# Patient Record
Sex: Female | Born: 1975
Health system: Southern US, Community
[De-identification: ages and names within clinical notes are randomized; demographics above are authoritative.]

## PROBLEM LIST (undated history)

## (undated) DIAGNOSIS — H9190 Unspecified hearing loss, unspecified ear: Secondary | ICD-10-CM

## (undated) DIAGNOSIS — I10 Essential (primary) hypertension: Secondary | ICD-10-CM

## (undated) DIAGNOSIS — G43909 Migraine, unspecified, not intractable, without status migrainosus: Secondary | ICD-10-CM

## (undated) DIAGNOSIS — K219 Gastro-esophageal reflux disease without esophagitis: Secondary | ICD-10-CM

## (undated) DIAGNOSIS — M545 Low back pain, unspecified: Secondary | ICD-10-CM

## (undated) DIAGNOSIS — E119 Type 2 diabetes mellitus without complications: Secondary | ICD-10-CM

## (undated) DIAGNOSIS — Z9889 Other specified postprocedural states: Secondary | ICD-10-CM

## (undated) DIAGNOSIS — F509 Eating disorder, unspecified: Secondary | ICD-10-CM

## (undated) DIAGNOSIS — J189 Pneumonia, unspecified organism: Secondary | ICD-10-CM

## (undated) DIAGNOSIS — F40240 Claustrophobia: Secondary | ICD-10-CM

## (undated) DIAGNOSIS — N189 Chronic kidney disease, unspecified: Secondary | ICD-10-CM

## (undated) DIAGNOSIS — F603 Borderline personality disorder: Secondary | ICD-10-CM

## (undated) DIAGNOSIS — R569 Unspecified convulsions: Secondary | ICD-10-CM

## (undated) DIAGNOSIS — I2699 Other pulmonary embolism without acute cor pulmonale: Secondary | ICD-10-CM

## (undated) DIAGNOSIS — N809 Endometriosis, unspecified: Secondary | ICD-10-CM

## (undated) DIAGNOSIS — Z8489 Family history of other specified conditions: Secondary | ICD-10-CM

## (undated) DIAGNOSIS — F32A Depression, unspecified: Secondary | ICD-10-CM

## (undated) DIAGNOSIS — D649 Anemia, unspecified: Secondary | ICD-10-CM

## (undated) DIAGNOSIS — F419 Anxiety disorder, unspecified: Secondary | ICD-10-CM

## (undated) DIAGNOSIS — E039 Hypothyroidism, unspecified: Secondary | ICD-10-CM

## (undated) DIAGNOSIS — E785 Hyperlipidemia, unspecified: Secondary | ICD-10-CM

## (undated) DIAGNOSIS — F29 Unspecified psychosis not due to a substance or known physiological condition: Secondary | ICD-10-CM

## (undated) DIAGNOSIS — R000 Tachycardia, unspecified: Secondary | ICD-10-CM

## (undated) DIAGNOSIS — F329 Major depressive disorder, single episode, unspecified: Secondary | ICD-10-CM

## (undated) DIAGNOSIS — R630 Anorexia: Secondary | ICD-10-CM

## (undated) DIAGNOSIS — F909 Attention-deficit hyperactivity disorder, unspecified type: Secondary | ICD-10-CM

## (undated) DIAGNOSIS — M1990 Unspecified osteoarthritis, unspecified site: Secondary | ICD-10-CM

## (undated) DIAGNOSIS — Z87442 Personal history of urinary calculi: Secondary | ICD-10-CM

## (undated) DIAGNOSIS — R112 Nausea with vomiting, unspecified: Secondary | ICD-10-CM

## (undated) DIAGNOSIS — G8929 Other chronic pain: Secondary | ICD-10-CM

## (undated) DIAGNOSIS — Z7289 Other problems related to lifestyle: Secondary | ICD-10-CM

## (undated) DIAGNOSIS — F429 Obsessive-compulsive disorder, unspecified: Secondary | ICD-10-CM

## (undated) HISTORY — PX: LAPAROSCOPIC ABDOMINAL EXPLORATION: SHX6249

## (undated) HISTORY — DX: Hyperlipidemia, unspecified: E78.5

## (undated) HISTORY — PX: COCHLEAR IMPLANT: SUR684

---
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
## 1998-04-22 HISTORY — PX: BREAST LUMPECTOMY: SHX2

## 2000-04-22 HISTORY — PX: BREAST LUMPECTOMY: SHX2

## 2001-04-22 DIAGNOSIS — I82409 Acute embolism and thrombosis of unspecified deep veins of unspecified lower extremity: Secondary | ICD-10-CM

## 2001-04-22 HISTORY — DX: Acute embolism and thrombosis of unspecified deep veins of unspecified lower extremity: I82.409

## 2001-09-20 HISTORY — PX: ABDOMINAL HYSTERECTOMY: SHX81

## 2001-10-20 DIAGNOSIS — I2699 Other pulmonary embolism without acute cor pulmonale: Secondary | ICD-10-CM

## 2001-10-20 HISTORY — DX: Other pulmonary embolism without acute cor pulmonale: I26.99

## 2004-12-21 ENCOUNTER — Inpatient Hospital Stay (HOSPITAL_COMMUNITY): Admission: AD | Admit: 2004-12-21 | Discharge: 2004-12-24 | Payer: Self-pay | Admitting: Psychiatry

## 2004-12-21 ENCOUNTER — Ambulatory Visit: Payer: Self-pay | Admitting: Psychiatry

## 2009-04-22 DIAGNOSIS — J189 Pneumonia, unspecified organism: Secondary | ICD-10-CM

## 2009-04-22 HISTORY — DX: Pneumonia, unspecified organism: J18.9

## 2012-07-10 ENCOUNTER — Encounter (HOSPITAL_COMMUNITY): Payer: Self-pay | Admitting: *Deleted

## 2012-07-10 ENCOUNTER — Emergency Department (HOSPITAL_COMMUNITY)
Admission: EM | Admit: 2012-07-10 | Discharge: 2012-07-11 | Disposition: A | Discharge status: Home / Self Care | Payer: Medicare Other | Attending: Emergency Medicine | Admitting: Emergency Medicine

## 2012-07-10 DIAGNOSIS — R5383 Other fatigue: Secondary | ICD-10-CM | POA: Insufficient documentation

## 2012-07-10 DIAGNOSIS — F429 Obsessive-compulsive disorder, unspecified: Secondary | ICD-10-CM | POA: Insufficient documentation

## 2012-07-10 DIAGNOSIS — R5381 Other malaise: Secondary | ICD-10-CM | POA: Insufficient documentation

## 2012-07-10 DIAGNOSIS — K219 Gastro-esophageal reflux disease without esophagitis: Secondary | ICD-10-CM | POA: Insufficient documentation

## 2012-07-10 DIAGNOSIS — Z8742 Personal history of other diseases of the female genital tract: Secondary | ICD-10-CM | POA: Insufficient documentation

## 2012-07-10 DIAGNOSIS — F329 Major depressive disorder, single episode, unspecified: Secondary | ICD-10-CM | POA: Insufficient documentation

## 2012-07-10 DIAGNOSIS — E039 Hypothyroidism, unspecified: Secondary | ICD-10-CM | POA: Insufficient documentation

## 2012-07-10 DIAGNOSIS — N189 Chronic kidney disease, unspecified: Secondary | ICD-10-CM | POA: Insufficient documentation

## 2012-07-10 DIAGNOSIS — G43909 Migraine, unspecified, not intractable, without status migrainosus: Secondary | ICD-10-CM | POA: Insufficient documentation

## 2012-07-10 DIAGNOSIS — F3289 Other specified depressive episodes: Secondary | ICD-10-CM | POA: Insufficient documentation

## 2012-07-10 DIAGNOSIS — N289 Disorder of kidney and ureter, unspecified: Secondary | ICD-10-CM | POA: Insufficient documentation

## 2012-07-10 DIAGNOSIS — Z79899 Other long term (current) drug therapy: Secondary | ICD-10-CM | POA: Insufficient documentation

## 2012-07-10 DIAGNOSIS — F411 Generalized anxiety disorder: Secondary | ICD-10-CM | POA: Insufficient documentation

## 2012-07-10 DIAGNOSIS — F509 Eating disorder, unspecified: Secondary | ICD-10-CM | POA: Insufficient documentation

## 2012-07-10 DIAGNOSIS — Z86711 Personal history of pulmonary embolism: Secondary | ICD-10-CM | POA: Insufficient documentation

## 2012-07-10 DIAGNOSIS — I129 Hypertensive chronic kidney disease with stage 1 through stage 4 chronic kidney disease, or unspecified chronic kidney disease: Secondary | ICD-10-CM | POA: Insufficient documentation

## 2012-07-10 DIAGNOSIS — Z8679 Personal history of other diseases of the circulatory system: Secondary | ICD-10-CM | POA: Insufficient documentation

## 2012-07-10 DIAGNOSIS — Z8669 Personal history of other diseases of the nervous system and sense organs: Secondary | ICD-10-CM | POA: Insufficient documentation

## 2012-07-10 HISTORY — DX: Tachycardia, unspecified: R00.0

## 2012-07-10 HISTORY — DX: Endometriosis, unspecified: N80.9

## 2012-07-10 HISTORY — DX: Anxiety disorder, unspecified: F41.9

## 2012-07-10 HISTORY — DX: Hypothyroidism, unspecified: E03.9

## 2012-07-10 HISTORY — DX: Migraine, unspecified, not intractable, without status migrainosus: G43.909

## 2012-07-10 HISTORY — DX: Other pulmonary embolism without acute cor pulmonale: I26.99

## 2012-07-10 HISTORY — DX: Essential (primary) hypertension: I10

## 2012-07-10 HISTORY — DX: Eating disorder, unspecified: F50.9

## 2012-07-10 HISTORY — DX: Chronic kidney disease, unspecified: N18.9

## 2012-07-10 HISTORY — DX: Obsessive-compulsive disorder, unspecified: F42.9

## 2012-07-10 HISTORY — DX: Anemia, unspecified: D64.9

## 2012-07-10 HISTORY — DX: Gastro-esophageal reflux disease without esophagitis: K21.9

## 2012-07-10 HISTORY — DX: Major depressive disorder, single episode, unspecified: F32.9

## 2012-07-10 HISTORY — DX: Depression, unspecified: F32.A

## 2012-07-10 HISTORY — DX: Unspecified convulsions: R56.9

## 2012-07-10 LAB — COMPREHENSIVE METABOLIC PANEL
ALT: 23 U/L (ref 0–35)
CO2: 20 mEq/L (ref 19–32)
Calcium: 9.4 mg/dL (ref 8.4–10.5)
Chloride: 98 mEq/L (ref 96–112)
Creatinine, Ser: 1.85 mg/dL — ABNORMAL HIGH (ref 0.50–1.10)
GFR calc Af Amer: 40 mL/min — ABNORMAL LOW (ref >90)
GFR calc non Af Amer: 35 mL/min — ABNORMAL LOW (ref >90)
Glucose, Bld: 119 mg/dL — ABNORMAL HIGH (ref 70–99)
Total Bilirubin: 0.6 mg/dL (ref 0.3–1.2)

## 2012-07-10 LAB — CBC WITH DIFFERENTIAL/PLATELET
Eosinophils Relative: 1 % (ref 0–5)
HCT: 39.7 % (ref 36.0–46.0)
Hemoglobin: 14.2 g/dL (ref 12.0–15.0)
Lymphocytes Relative: 26 % (ref 12–46)
Lymphs Abs: 2.1 10*3/uL (ref 0.7–4.0)
MCV: 88.6 fL (ref 78.0–100.0)
Monocytes Absolute: 0.6 10*3/uL (ref 0.1–1.0)
RBC: 4.48 MIL/uL (ref 3.87–5.11)
WBC: 7.8 10*3/uL (ref 4.0–10.5)

## 2012-07-10 LAB — URINALYSIS, ROUTINE W REFLEX MICROSCOPIC
Glucose, UA: NEGATIVE mg/dL
Hgb urine dipstick: NEGATIVE
Ketones, ur: NEGATIVE mg/dL
Leukocytes, UA: NEGATIVE
pH: 5 (ref 5.0–8.0)

## 2012-07-10 NOTE — ED Notes (Signed)
Pt states she has hx of eating disorder.  Has been taking laxitvees for the past couple weeks.  Took "a lot" yesterday.  C/o weakness, n/v, intense diarrhea, and abdominal pain. Lost 12 pounds in 2 weeks

## 2012-07-10 NOTE — ED Notes (Signed)
Pt has hx of taking laxatives and purging. States that tonight her abdominal pain was so bad she called [facility] Crisis line and was instructed to come to ED. Pt reports  10/10 abdominal pain. Has been purging and taking laxatives for "last couple of days" but denies taking laxatives today. Pt AO x 4.

## 2012-07-11 MED ORDER — FAMOTIDINE 20 MG PO TABS: 20.0000 mg | ORAL_TABLET | Freq: Two times a day (BID) | ORAL | Status: DC

## 2012-07-11 MED ORDER — SODIUM CHLORIDE 0.9 % IV SOLN
INTRAVENOUS | Status: DC
  Administered 2012-07-11: 01:00:00 via INTRAVENOUS

## 2012-07-11 MED ORDER — PANTOPRAZOLE SODIUM 40 MG IV SOLR
40.0000 mg | Freq: Once | INTRAVENOUS | Status: AC
  Administered 2012-07-11: 40 mg via INTRAVENOUS
  Filled 2012-07-11: qty 40

## 2012-07-11 NOTE — ED Provider Notes (Signed)
History     CSN: [id number]  Arrival date & time 07/10/12  2244   First MD Initiated Contact with Patient 07/11/12 0045      Chief Complaint  Patient presents with  . Eating Disorder  . Weakness    (Consider location/radiation/quality/duration/timing/severity/associated sxs/prior treatment) HPI History provided by Patient - has a mixed eating disorder, recently discharged from a psychiatric facility after long term admission for bipolar and her eating disorder.  She had gained about 30 pounds in the hospital and this has caused her stress and her reaction to this has been to purge and take laxatives over the last few days.  She recognizes this as an issue and presented to [facility] for help.  No SI/ HI or psychosis. She has been having some increased epigastric discomfort the last few days and was sent here from [facility] for medical clearance and to check here "electrolytes"  - she has known renal insufficiency - followed by local PCP.  No change in her urine. No blood in stools, no bloody emesis. No radiation of sharp pains, has had endoscopy in the past denies h/o PUD. Symptoms moderate in severity.   Past Medical History  Diagnosis Date  . Eating disorder   . Anxiety   . Depression   . OCD (obsessive compulsive disorder)   . Pulmonary embolism   . Seizures   . Anemia   . Tachycardia   . Hypertension   . Chronic renal insufficiency   . 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 Endometriosis   . GERD (gastroesophageal reflux disease)   . Migraine     Past Surgical History  Procedure Laterality Date  . Cochlear implant    . Eye surgery    . Breast lumpectomy      x2  . Abdominal hysterectomy      'except for right ovary'    History reviewed. No pertinent family history.  History  Substance Use Topics  . Smoking status: Never Smoker   . Smokeless tobacco: Not on file  . Alcohol Use: No    OB History   Grav Para Term Preterm Abortions TAB SAB Ect Mult  Living                  Review of Systems  Constitutional: Negative for fever and chills.  HENT: Negative for neck pain and neck stiffness.   Eyes: Negative for pain.  Respiratory: Negative for shortness of breath.   Cardiovascular: Negative for chest pain.  Gastrointestinal: Positive for abdominal pain. Negative for blood in stool.  Genitourinary: Negative for dysuria.  Musculoskeletal: Negative for back pain.  Skin: Negative for rash.  Neurological: Negative for headaches.  All other systems reviewed and are negative.    Allergies  Iodinated diagnostic agents; Abilify; Dihydroergotamine; Entex lq; Erythromycin; Lactose intolerance (gi); and Tape  Home Medications   Current Outpatient Rx  Name  Route  Sig  Dispense  Refill  . acetaminophen (TYLENOL) 500 MG tablet   Oral   Take 500 mg by mouth every 6 (six) hours as needed for pain (headache).         . desvenlafaxine (PRISTIQ) 50 MG 24 hr tablet   Oral   Take 50 mg by mouth daily.         . diphenhydrAMINE (BENADRYL) 12.5 MG/5ML elixir   Oral   Take 12.5 mg by mouth 4 (four) times daily as needed (to alleviate twitches and cramping associated with Haldol).         Marland Kitchen  diphenhydrAMINE (BENADRYL) 25 mg capsule   Oral   Take 25 mg by mouth every 6 (six) hours as needed (to alleviate cramps and twitching when taking haldol).         Marland Kitchen docusate sodium (COLACE) 100 MG capsule   Oral   Take 200 mg by mouth 2 (two) times daily.         . fenofibrate (TRICOR) 48 MG tablet   Oral   Take 48 mg by mouth daily.         . haloperidol (HALDOL) 5 MG tablet   Oral   Take 5 mg by mouth every 12 (twelve) hours as needed (for anxiety or going through crisis events).         . lactase (LACTAID) 3000 UNITS tablet   Oral   Take 1 tablet by mouth 3 (three) times daily with meals.         Marland Kitchen levothyroxine (SYNTHROID, LEVOTHROID) 175 MCG tablet   Oral   Take 175 mcg by mouth daily.         . Magnesium 200 MG  TABS   Oral   Take 1 tablet by mouth daily.         . Melatonin 3 MG TABS   Oral   Take 1 tablet by mouth every evening.         . mirtazapine (REMERON) 30 MG tablet   Oral   Take 60 mg by mouth every evening.         . Multiple Vitamin (MULTIVITAMIN WITH MINERALS) TABS   Oral   Take 1 tablet by mouth daily.         . Omega-3 Fatty Acids (FISH OIL) 1200 MG CAPS   Oral   Take 1 capsule by mouth 2 (two) times daily.         Marland Kitchen omeprazole (PRILOSEC) 20 MG capsule   Oral   Take 20 mg by mouth daily.         . ondansetron (ZOFRAN) 4 MG tablet   Oral   Take 4 mg by mouth every 8 (eight) hours as needed for nausea.         . polyethylene glycol (MIRALAX / GLYCOLAX) packet   Oral   Take 17 g by mouth daily.         . pravastatin (PRAVACHOL) 20 MG tablet   Oral   Take 20 mg by mouth every evening.         Marland Kitchen QUEtiapine (SEROQUEL) 100 MG tablet   Oral   Take 100 mg by mouth 3 (three) times daily.         . QUEtiapine (SEROQUEL) 400 MG tablet   Oral   Take 400 mg by mouth at bedtime.         . SUMAtriptan (IMITREX) 100 MG tablet   Oral   Take 50-100 mg by mouth every 2 (two) hours as needed for migraine (initial dose is 100mg , make take 50mg  every 2 hours if needed to relieve migraine.  No more than 200 mg total in 24 hours).         . traMADol (ULTRAM) 50 MG tablet   Oral   Take 50 mg by mouth 2 (two) times daily.         . Vitamin D, Ergocalciferol, (DRISDOL) 50000 UNITS CAPS   Oral   Take 50,000 Units by mouth every 7 (seven) days. On Sunday           BP  122/78  Pulse 113  Temp(Src) 97.7 F (36.5 C) (Oral)  Resp 18  SpO2 95%  Physical Exam  Constitutional: She is oriented to person, place, and time. She appears well-developed and well-nourished.  HENT:  Head: Normocephalic and atraumatic.  Mouth/Throat: Oropharynx is clear and moist. No oropharyngeal exudate.  Eyes: EOM are normal. Pupils are equal, round, and reactive to light.  No scleral icterus.  Neck: Normal range of motion. Neck supple.  Cardiovascular: Normal rate, regular rhythm and intact distal pulses.   Pulmonary/Chest: Effort normal and breath sounds normal. No respiratory distress.  Abdominal: Soft. Bowel sounds are normal. She exhibits no distension. There is no rebound and no guarding.  Mild epigastric tenderness, no tenderness otherwise and meg Murphys sign  Musculoskeletal: Normal range of motion. She exhibits no edema.  Neurological: She is alert and oriented to person, place, and time.  Skin: Skin is warm and dry.  Psychiatric: Her behavior is normal. Judgment normal.    ED Course  Procedures (including critical care time)  Results for orders placed during the hospital encounter of 07/10/12  CBC WITH DIFFERENTIAL      Result Value Range   WBC 7.8  4.0 - 10.5 K/uL   RBC 4.48  3.87 - 5.11 MIL/uL   Hemoglobin 14.2  12.0 - 15.0 g/dL   HCT 39.7  36.0 - 46.0 %   MCV 88.6  78.0 - 100.0 fL   MCH 31.7  26.0 - 34.0 pg   MCHC 35.8  30.0 - 36.0 g/dL   RDW 12.2  11.5 - 15.5 %   Platelets 327  150 - 400 K/uL   Neutrophils Relative 64  43 - 77 %   Neutro Abs 5.0  1.7 - 7.7 K/uL   Lymphocytes Relative 26  12 - 46 %   Lymphs Abs 2.1  0.7 - 4.0 K/uL   Monocytes Relative 8  3 - 12 %   Monocytes Absolute 0.6  0.1 - 1.0 K/uL   Eosinophils Relative 1  0 - 5 %   Eosinophils Absolute 0.1  0.0 - 0.7 K/uL   Basophils Relative 1  0 - 1 %   Basophils Absolute 0.0  0.0 - 0.1 K/uL  COMPREHENSIVE METABOLIC PANEL      Result Value Range   Sodium 135  135 - 145 mEq/L   Potassium 3.7  3.5 - 5.1 mEq/L   Chloride 98  96 - 112 mEq/L   CO2 20  19 - 32 mEq/L   Glucose, Bld 119 (*) 70 - 99 mg/dL   BUN 25 (*) 6 - 23 mg/dL   Creatinine, Ser 1.85 (*) 0.50 - 1.10 mg/dL   Calcium 9.4  8.4 - 10.5 mg/dL   Total Protein 7.3  6.0 - 8.3 g/dL   Albumin 4.3  3.5 - 5.2 g/dL   AST 21  0 - 37 U/L   ALT 23  0 - 35 U/L   Alkaline Phosphatase 58  39 - 117 U/L   Total Bilirubin  0.6  0.3 - 1.2 mg/dL   GFR calc non Af Amer 35 (*) >90 mL/min   GFR calc Af Amer 40 (*) >90 mL/min  URINALYSIS, ROUTINE W REFLEX MICROSCOPIC      Result Value Range   Color, Urine YELLOW  YELLOW   APPearance CLOUDY (*) CLEAR   Specific Gravity, Urine 1.021  1.005 - 1.030   pH 5.0  5.0 - 8.0   Glucose, UA NEGATIVE  NEGATIVE mg/dL  Hgb urine dipstick NEGATIVE  NEGATIVE   Bilirubin Urine NEGATIVE  NEGATIVE   Ketones, ur NEGATIVE  NEGATIVE mg/dL   Protein, ur NEGATIVE  NEGATIVE mg/dL   Urobilinogen, UA 0.2  0.0 - 1.0 mg/dL   Nitrite NEGATIVE  NEGATIVE   Leukocytes, UA NEGATIVE  NEGATIVE    2:11 AM d/w ACT - will evaluate bedside for outpatient recommendations.   She was provided with outpatient referrals - she is already established with outpatient psych program.   IVFs, IV protonix provided. PT tolerating POs and has no indication for admit at this time. She agrees to close follow up. GI referral provided.  MDM  Epigastric pain in the setting of eating disorder taking excessive amount of laxative. No acute ABD serial exams.   ACT evaluation - has good PSY f/u  Labs, UA - unknown baseline crt - has known renal insuff  IV fluids and medications provided. VS and nursing notes reviewed       Teressa Lower, MD 07/12/12 [phone]

## 2012-07-11 NOTE — ED Notes (Signed)
Pt comfortable with f/u instructions.

## 2012-08-12 ENCOUNTER — Emergency Department (INDEPENDENT_AMBULATORY_CARE_PROVIDER_SITE_OTHER)
Admission: EM | Admit: 2012-08-12 | Discharge: 2012-08-12 | Disposition: A | Discharge status: Home / Self Care | Payer: Medicare Other | Source: Home / Self Care | Attending: Family Medicine | Admitting: Family Medicine

## 2012-08-12 ENCOUNTER — Encounter (HOSPITAL_COMMUNITY): Payer: Self-pay | Admitting: *Deleted

## 2012-08-12 DIAGNOSIS — S239XXA Sprain of unspecified parts of thorax, initial encounter: Secondary | ICD-10-CM

## 2012-08-12 HISTORY — DX: Unspecified hearing loss, unspecified ear: H91.90

## 2012-08-12 MED ORDER — METAXALONE 800 MG PO TABS: 800.0000 mg | ORAL_TABLET | Freq: Three times a day (TID) | ORAL | Status: DC

## 2012-08-12 MED ORDER — DICLOFENAC POTASSIUM 50 MG PO TABS: 50.0000 mg | ORAL_TABLET | Freq: Three times a day (TID) | ORAL | Status: DC
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
## 2012-08-12 NOTE — ED Notes (Signed)
C/o severe back pain onset Monday morning. No known injury.  Pt. wearing bil. hearing aids and is HOH.  States Dr. Reine Just her psychiatrist sent her here to be checked. She has been up the past 2 nights crying with pain.  Her Mom put [facility] packs on it without relief.  Her Dad felt a knot in her back under her L shoulderblade.

## 2012-08-13 ENCOUNTER — Emergency Department (HOSPITAL_COMMUNITY): Payer: No Typology Code available for payment source

## 2012-08-13 ENCOUNTER — Encounter (HOSPITAL_COMMUNITY): Payer: Self-pay | Admitting: Emergency Medicine

## 2012-08-13 ENCOUNTER — Emergency Department (HOSPITAL_COMMUNITY)
Admission: EM | Admit: 2012-08-13 | Discharge: 2012-08-17 | Disposition: A | Discharge status: Home / Self Care | Payer: No Typology Code available for payment source | Attending: Emergency Medicine | Admitting: Emergency Medicine

## 2012-08-13 DIAGNOSIS — F3289 Other specified depressive episodes: Secondary | ICD-10-CM | POA: Diagnosis not present

## 2012-08-13 DIAGNOSIS — F32A Depression, unspecified: Secondary | ICD-10-CM

## 2012-08-13 DIAGNOSIS — F411 Generalized anxiety disorder: Secondary | ICD-10-CM | POA: Diagnosis not present

## 2012-08-13 DIAGNOSIS — Z8742 Personal history of other diseases of the female genital tract: Secondary | ICD-10-CM | POA: Diagnosis not present

## 2012-08-13 DIAGNOSIS — R4589 Other symptoms and signs involving emotional state: Secondary | ICD-10-CM

## 2012-08-13 DIAGNOSIS — Z86711 Personal history of pulmonary embolism: Secondary | ICD-10-CM | POA: Insufficient documentation

## 2012-08-13 DIAGNOSIS — N189 Chronic kidney disease, unspecified: Secondary | ICD-10-CM | POA: Insufficient documentation

## 2012-08-13 DIAGNOSIS — Z79899 Other long term (current) drug therapy: Secondary | ICD-10-CM | POA: Insufficient documentation

## 2012-08-13 DIAGNOSIS — I129 Hypertensive chronic kidney disease with stage 1 through stage 4 chronic kidney disease, or unspecified chronic kidney disease: Secondary | ICD-10-CM | POA: Insufficient documentation

## 2012-08-13 DIAGNOSIS — F603 Borderline personality disorder: Secondary | ICD-10-CM

## 2012-08-13 DIAGNOSIS — R45851 Suicidal ideations: Secondary | ICD-10-CM | POA: Diagnosis not present

## 2012-08-13 DIAGNOSIS — F329 Major depressive disorder, single episode, unspecified: Secondary | ICD-10-CM | POA: Diagnosis not present

## 2012-08-13 DIAGNOSIS — IMO0002 Reserved for concepts with insufficient information to code with codable children: Secondary | ICD-10-CM | POA: Diagnosis not present

## 2012-08-13 DIAGNOSIS — Y9389 Activity, other specified: Secondary | ICD-10-CM | POA: Diagnosis not present

## 2012-08-13 DIAGNOSIS — Z8679 Personal history of other diseases of the circulatory system: Secondary | ICD-10-CM | POA: Insufficient documentation

## 2012-08-13 DIAGNOSIS — Z8669 Personal history of other diseases of the nervous system and sense organs: Secondary | ICD-10-CM | POA: Diagnosis not present

## 2012-08-13 DIAGNOSIS — S99929A Unspecified injury of unspecified foot, initial encounter: Secondary | ICD-10-CM | POA: Diagnosis not present

## 2012-08-13 DIAGNOSIS — G43909 Migraine, unspecified, not intractable, without status migrainosus: Secondary | ICD-10-CM | POA: Insufficient documentation

## 2012-08-13 DIAGNOSIS — S99919A Unspecified injury of unspecified ankle, initial encounter: Secondary | ICD-10-CM | POA: Insufficient documentation

## 2012-08-13 DIAGNOSIS — S8990XA Unspecified injury of unspecified lower leg, initial encounter: Secondary | ICD-10-CM | POA: Insufficient documentation

## 2012-08-13 DIAGNOSIS — K219 Gastro-esophageal reflux disease without esophagitis: Secondary | ICD-10-CM | POA: Diagnosis not present

## 2012-08-13 DIAGNOSIS — Y92410 Unspecified street and highway as the place of occurrence of the external cause: Secondary | ICD-10-CM | POA: Diagnosis not present

## 2012-08-13 DIAGNOSIS — S3981XA Other specified injuries of abdomen, initial encounter: Secondary | ICD-10-CM | POA: Insufficient documentation

## 2012-08-13 DIAGNOSIS — H9190 Unspecified hearing loss, unspecified ear: Secondary | ICD-10-CM | POA: Insufficient documentation

## 2012-08-13 DIAGNOSIS — Z862 Personal history of diseases of the blood and blood-forming organs and certain disorders involving the immune mechanism: Secondary | ICD-10-CM | POA: Insufficient documentation

## 2012-08-13 DIAGNOSIS — F324 Major depressive disorder, single episode, in partial remission: Secondary | ICD-10-CM

## 2012-08-13 DIAGNOSIS — S298XXA Other specified injuries of thorax, initial encounter: Secondary | ICD-10-CM | POA: Diagnosis present

## 2012-08-13 MED ORDER — IBUPROFEN 800 MG PO TABS
800.0000 mg | ORAL_TABLET | Freq: Once | ORAL | Status: AC
  Administered 2012-08-14: 800 mg via ORAL

## 2012-08-13 MED ORDER — HYDROCODONE-ACETAMINOPHEN 5-325 MG PO TABS
1.0000 | ORAL_TABLET | Freq: Once | ORAL | Status: AC
  Administered 2012-08-14: 1 via ORAL
  Filled 2012-08-13: qty 1

## 2012-08-13 MED ORDER — DIAZEPAM 5 MG PO TABS
5.0000 mg | ORAL_TABLET | Freq: Once | ORAL | Status: AC
  Administered 2012-08-14: 5 mg via ORAL
  Filled 2012-08-13: qty 1
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
## 2012-08-14 DIAGNOSIS — F3289 Other specified depressive episodes: Secondary | ICD-10-CM | POA: Diagnosis not present

## 2012-08-14 DIAGNOSIS — F329 Major depressive disorder, single episode, unspecified: Secondary | ICD-10-CM | POA: Diagnosis not present

## 2012-08-14 LAB — COMPREHENSIVE METABOLIC PANEL
ALT: 19 U/L (ref 0–35)
AST: 23 U/L (ref 0–37)
CO2: 26 mEq/L (ref 19–32)
Chloride: 99 mEq/L (ref 96–112)
GFR calc non Af Amer: 40 mL/min — ABNORMAL LOW (ref >90)
Potassium: 4.3 mEq/L (ref 3.5–5.1)
Sodium: 135 mEq/L (ref 135–145)
Total Bilirubin: 0.3 mg/dL (ref 0.3–1.2)

## 2012-08-14 LAB — RAPID URINE DRUG SCREEN, HOSP PERFORMED
Barbiturates: NOT DETECTED
Cocaine: NOT DETECTED
Tetrahydrocannabinol: NOT DETECTED

## 2012-08-14 LAB — URINALYSIS, ROUTINE W REFLEX MICROSCOPIC
Bilirubin Urine: NEGATIVE
Hgb urine dipstick: NEGATIVE
Protein, ur: NEGATIVE mg/dL
Specific Gravity, Urine: 1.008 (ref 1.005–1.030)
Urobilinogen, UA: 0.2 mg/dL (ref 0.0–1.0)

## 2012-08-14 LAB — CBC
HCT: 37.1 % (ref 36.0–46.0)
MCV: 90 fL (ref 78.0–100.0)
Platelets: 359 10*3/uL (ref 150–400)
Platelets: 317 10*3/uL (ref 150–400)
RBC: 4.32 MIL/uL (ref 3.87–5.11)
RBC: 4.12 MIL/uL (ref 3.87–5.11)
WBC: 15.1 10*3/uL — ABNORMAL HIGH (ref 4.0–10.5)
WBC: 8.9 10*3/uL (ref 4.0–10.5)

## 2012-08-14 LAB — SALICYLATE LEVEL: Salicylate Lvl: <2 mg/dL — ABNORMAL LOW (ref 2.8–20.0)

## 2012-08-14 MED ORDER — QUETIAPINE FUMARATE 300 MG PO TABS
400.0000 mg | ORAL_TABLET | Freq: Every day | ORAL | Status: DC
  Administered 2012-08-14 – 2012-08-16 (×3): 400 mg via ORAL
  Filled 2012-08-14 (×3): qty 1

## 2012-08-14 MED ORDER — METAXALONE 800 MG PO TABS
800.0000 mg | ORAL_TABLET | Freq: Three times a day (TID) | ORAL | Status: DC
  Administered 2012-08-14 – 2012-08-17 (×10): 800 mg via ORAL
  Filled 2012-08-14 (×13): qty 1

## 2012-08-14 MED ORDER — MELATONIN 3 MG PO TABS: 1.0000 | ORAL_TABLET | Freq: Every evening | ORAL | Status: DC

## 2012-08-14 MED ORDER — VENLAFAXINE HCL ER 75 MG PO CP24
75.0000 mg | ORAL_CAPSULE | Freq: Every day | ORAL | Status: DC
  Administered 2012-08-14: 75 mg via ORAL
  Filled 2012-08-14 (×2): qty 1

## 2012-08-14 MED ORDER — MIRTAZAPINE 30 MG PO TABS
60.0000 mg | ORAL_TABLET | Freq: Every evening | ORAL | Status: DC
  Administered 2012-08-14 – 2012-08-16 (×3): 60 mg via ORAL
  Filled 2012-08-14: qty 1
  Filled 2012-08-14: qty 2
  Filled 2012-08-14: qty 1
  Filled 2012-08-14: qty 2

## 2012-08-14 MED ORDER — HALOPERIDOL 5 MG PO TABS
5.0000 mg | ORAL_TABLET | Freq: Two times a day (BID) | ORAL | Status: DC
  Administered 2012-08-14 – 2012-08-17 (×7): 5 mg via ORAL
  Filled 2012-08-14 (×5): qty 1

## 2012-08-14 MED ORDER — HALOPERIDOL 5 MG PO TABS
5.0000 mg | ORAL_TABLET | Freq: Two times a day (BID) | ORAL | Status: DC | PRN
  Filled 2012-08-14 (×2): qty 1

## 2012-08-14 MED ORDER — ACETAMINOPHEN 325 MG PO TABS
650.0000 mg | ORAL_TABLET | ORAL | Status: DC | PRN
  Administered 2012-08-14 – 2012-08-16 (×6): 650 mg via ORAL
  Filled 2012-08-14 (×6): qty 2

## 2012-08-14 MED ORDER — DIPHENHYDRAMINE HCL 50 MG/ML IJ SOLN
25.0000 mg | Freq: Once | INTRAMUSCULAR | Status: AC
  Administered 2012-08-14: 25 mg via INTRAMUSCULAR
  Filled 2012-08-14: qty 1

## 2012-08-14 MED ORDER — ADULT MULTIVITAMIN W/MINERALS CH
1.0000 | ORAL_TABLET | Freq: Every day | ORAL | Status: DC
  Administered 2012-08-14 – 2012-08-17 (×4): 1 via ORAL
  Filled 2012-08-14 (×4): qty 1

## 2012-08-14 MED ORDER — ONDANSETRON HCL 4 MG PO TABS: 4.0000 mg | ORAL_TABLET | Freq: Three times a day (TID) | ORAL | Status: DC | PRN

## 2012-08-14 MED ORDER — QUETIAPINE FUMARATE 100 MG PO TABS
100.0000 mg | ORAL_TABLET | Freq: Three times a day (TID) | ORAL | Status: DC
  Administered 2012-08-14 – 2012-08-17 (×9): 100 mg via ORAL
  Filled 2012-08-14 (×7): qty 1

## 2012-08-14 MED ORDER — SIMVASTATIN 10 MG PO TABS
10.0000 mg | ORAL_TABLET | Freq: Every day | ORAL | Status: DC
  Administered 2012-08-14 – 2012-08-16 (×3): 10 mg via ORAL
  Filled 2012-08-14 (×4): qty 1

## 2012-08-14 MED ORDER — DIPHENHYDRAMINE HCL 25 MG PO CAPS
25.0000 mg | ORAL_CAPSULE | Freq: Four times a day (QID) | ORAL | Status: DC | PRN
  Administered 2012-08-15 – 2012-08-16 (×2): 25 mg via ORAL
  Filled 2012-08-14 (×2): qty 1

## 2012-08-14 MED ORDER — LEVOTHYROXINE SODIUM 175 MCG PO TABS
175.0000 ug | ORAL_TABLET | Freq: Every day | ORAL | Status: DC
  Administered 2012-08-14 – 2012-08-17 (×4): 175 ug via ORAL
  Filled 2012-08-14 (×5): qty 1

## 2012-08-14 MED ORDER — DESVENLAFAXINE SUCCINATE ER 50 MG PO TB24
50.0000 mg | ORAL_TABLET | Freq: Every day | ORAL | Status: DC
  Administered 2012-08-15 – 2012-08-17 (×3): 50 mg via ORAL
  Filled 2012-08-14 (×4): qty 1

## 2012-08-14 MED ORDER — LORAZEPAM 0.5 MG PO TABS
0.5000 mg | ORAL_TABLET | Freq: Three times a day (TID) | ORAL | Status: DC
  Administered 2012-08-14 – 2012-08-17 (×8): 0.5 mg via ORAL
  Filled 2012-08-14 (×8): qty 1

## 2012-08-14 MED ORDER — LORAZEPAM 1 MG PO TABS
1.0000 mg | ORAL_TABLET | Freq: Three times a day (TID) | ORAL | Status: DC | PRN
  Administered 2012-08-14 – 2012-08-16 (×2): 1 mg via ORAL
  Filled 2012-08-14 (×2): qty 1

## 2012-08-14 MED ORDER — ZOLPIDEM TARTRATE 5 MG PO TABS
5.0000 mg | ORAL_TABLET | Freq: Every evening | ORAL | Status: DC | PRN
  Administered 2012-08-14 – 2012-08-15 (×2): 5 mg via ORAL
  Filled 2012-08-14 (×2): qty 1

## 2012-08-14 MED ORDER — IBUPROFEN 600 MG PO TABS
600.0000 mg | ORAL_TABLET | Freq: Three times a day (TID) | ORAL | Status: DC | PRN
  Administered 2012-08-14: 600 mg via ORAL
  Filled 2012-08-14 (×2): qty 3

## 2012-08-14 MED ORDER — HALOPERIDOL LACTATE 5 MG/ML IJ SOLN
5.0000 mg | Freq: Once | INTRAMUSCULAR | Status: AC
  Administered 2012-08-14: 5 mg via INTRAMUSCULAR
  Filled 2012-08-14: qty 1

## 2012-08-14 NOTE — ED Notes (Signed)
Pt and RN got her hearing aid batteries and charging equipment out and have her hearing aid batteries charging currently. Pt apologized for her behavior earlier this morning. Pt given a book from her belongings and also her glasses are at bedside. Pt said charge batteries for 2-3 hours. She said she's going back to sleep now but wake her for medications.

## 2012-08-14 NOTE — ED Notes (Signed)
Blood currently being drawn. EKG completed.

## 2012-08-14 NOTE — Progress Notes (Signed)
PHARMACIST - PHYSICIAN ORDER COMMUNICATION  CONCERNING: P&T Medication Policy on Herbal Medications  DESCRIPTION:  This patient's order for:  Melatonin  has been noted.  This product(s) is classified as an "herbal" or natural product. Due to a lack of definitive safety studies or FDA approval, nonstandard manufacturing practices, plus the potential risk of unknown drug-drug interactions while on inpatient medications, the Pharmacy and Therapeutics Committee does not permit the use of "herbal" or natural products of this type within [facility].   ACTION TAKEN: The pharmacy department is unable to verify this order at this time and your patient has been informed of this safety policy. Please reevaluate patient's clinical condition at discharge and address if the herbal or natural product(s) should be resumed at that time.  Thanks! Dorrene German 08/14/2012 12:35 AM

## 2012-08-14 NOTE — BHH Counselor (Signed)
Brandi Love, Education officer, museum at [employer], submitted Pt for admission to [facility]. Consulted with Ozella Rocks, Assistant Director, who said she had already spoken to Nonah Mattes, Director of Inpatient Services, regarding this Pt and Pt is declined due to Pt needing long-term treatment and for continuity of care recommended that [facility] the [facility] hospital. Communicated this recommendation to Waldon Merl, assessment counselor at [facility].  [place], [place], [facility] Assessment Counselor
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
## 2012-08-14 NOTE — ED Notes (Signed)
Community ACT worker returned with 3 additional belonging bags for the pt, those 3 additional bag were placed in the activity room.  Pt also has 3 bags in [place] [address]43

## 2012-08-14 NOTE — ED Notes (Signed)
Pt is sleeping at the start of my shift, unable to assess at this time.

## 2012-08-14 NOTE — Progress Notes (Signed)
CSW discussed IVC status with EDP who agreed to hold off on completing IVC as patient is calm and cooperative at this time. IVC paperwork was completed, signed, and notarized however not sent to magistrate.   Dorathy Kinsman, [place]  [phone] .08/14/2012 1347pm

## 2012-08-14 NOTE — BHH Counselor (Signed)
Patient referred to [facility]. Authorization obtained from [place] # [id number]. Acceptance to the waitlist is pending the results of a repeat WBC and EKG. Once completed oncoming staff will need to fax the results to [facility].   On-coming staff will need to continue seek additional placement.

## 2012-08-14 NOTE — ED Notes (Signed)
RN requested labs to be drawn later.

## 2012-08-14 NOTE — ED Notes (Signed)
Telepsych completed. Inpatient recommended due to pt's ambivalence about hurting herself.

## 2012-08-14 NOTE — ED Notes (Signed)
Per Jarrett Soho PA ok for patient to take seroquel and haldol in addition to ativan/vicodin/valium already given

## 2012-08-14 NOTE — ED Notes (Signed)
telepsych info faxed and called 

## 2012-08-14 NOTE — ED Notes (Signed)
[id number] Expected date:<BR> Expected time:<BR> Means of arrival:<BR> Comments:<BR> closed

## 2012-08-14 NOTE — BHH Counselor (Signed)
Per Marijean Bravo, in the assessment department patient declined by [facility] due to her acuity. Recommends admission to [facility].   Writer will seek alternative placement including [place] for this patient.

## 2012-08-14 NOTE — BHH Counselor (Signed)
Patient has a ACT team with [facility]. The ACT team nurse-Kelly Marcello Moores came by to visit patient. Her # is [phone]. Patients ACT team doctor is (Dr. Lesly Rubenstein) and his # is [phone]. The main # to [facility] is # [phone].

## 2012-08-14 NOTE — BHH Counselor (Signed)
  Writer faxed the WBC and EKG to [facility].  On-coming staff will need to follow up with the patients status on their wait list.

## 2012-08-14 NOTE — Progress Notes (Addendum)
Per discussion with psychiatrist and NP, patient is unable to contract for safety. Per discussion with staff, patient calm and cooperative since this morning.   Brandi Love, [place]  [phone] .08/14/2012 12:39pm

## 2012-08-14 NOTE — ED Notes (Signed)
Lab tech came to draw blood, asked to wait until pt wakes up as we are doing the same for her EKG and VS. So when pt wakes up she will get labs drawn, VS and  EKG.

## 2012-08-14 NOTE — ED Notes (Signed)
Patient moved to Rm 9 for being agitated and combative in the psych ED. She was placed in a C Collar and head blocks due to attempting self harm  By biting herself
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
## 2012-08-14 NOTE — ED Notes (Signed)
[place] key given to [employer] pt's community ACT Recruitment consultant.  Pt signed a consent to release information to the ACT team at [facility]
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
## 2012-08-14 NOTE — ED Notes (Signed)
Pt seen on camera scratching herself, RN went into room to tell her to stop. She complied. Again, pt was seen on the camera scratching her arm and this RN went into room and saw a rope wrapped around her fingers, RN asked her to take it off and give it to her, pt had to be asked several times but she finally complied. Both RN's and security went to pt room to search her to see if she had any other contraband on her. She was unwilling but a pen was found that went to the cord and pen cap she gave this RN earlier. Also, mentos candies were found in her pocket and she has on her regular underwear. Pt kept scratching herself and keeping her legs pulled up so this RN couldn't see what she was doing, she would not keep her legs down nor put her arms at her side. She said she was mad she had to stay here even though during the telepsych she said she needed to stay here. She fought with security, Environmental health practitioner as they tried to restrain her trying to kick and bite them. She kept trying to bite at restraints and did get one arm out of restraints. She required security and RN presence at all times to make sure she didn't bite herself or restraints. Seizure pads were put on her bed as a precaution should she start to bang her head on the railings of the bed. Even in restraints it took security and RN to keep pt from biting at herself and restraints. This RN called for IM orders and asked for chest restraint due to the continuous biting attempts but the EDP said to try medications first. IM orders obtained and administered. Charge RN called while medications were being drawn up and said pt could move over to main ed after medicated. At 0605 pt was moved out to main ed.

## 2012-08-14 NOTE — ED Notes (Signed)
AC from [facility] initially said that pt could move back to psych ed after 0700 when there were two nurses and a tech due to the [facility] from [facility] calling her about two RN's not being able to handle one pt. Since then, [facility] at [facility] has been in contact with the RN on call and pt will be moved back to the psych ed. This RN Radio broadcast assistant and ask that they ensure underwear were removed from pt before she returned to the psych ed and she said that wasn't a problem they could do that.

## 2012-08-14 NOTE — ED Notes (Signed)
Pt sleeping, resting and comfortable on stretcher, notified EDP, EDP ok with vitals and EKG being done once pt wakes up.

## 2012-08-15 DIAGNOSIS — F3289 Other specified depressive episodes: Secondary | ICD-10-CM | POA: Diagnosis not present

## 2012-08-15 DIAGNOSIS — F329 Major depressive disorder, single episode, unspecified: Secondary | ICD-10-CM | POA: Diagnosis not present

## 2012-08-15 MED ORDER — TRAMADOL HCL 50 MG PO TABS
50.0000 mg | ORAL_TABLET | Freq: Two times a day (BID) | ORAL | Status: DC | PRN
  Administered 2012-08-15 – 2012-08-16 (×2): 50 mg via ORAL
  Filled 2012-08-15 (×2): qty 1

## 2012-08-15 MED ORDER — FAMOTIDINE 20 MG PO TABS
20.0000 mg | ORAL_TABLET | Freq: Two times a day (BID) | ORAL | Status: DC
  Administered 2012-08-15 – 2012-08-17 (×5): 20 mg via ORAL
  Filled 2012-08-15 (×5): qty 1

## 2012-08-15 MED ORDER — MAGNESIUM OXIDE 400 (241.3 MG) MG PO TABS
200.0000 mg | ORAL_TABLET | Freq: Every day | ORAL | Status: DC
  Administered 2012-08-15 – 2012-08-17 (×3): 200 mg via ORAL
  Filled 2012-08-15 (×3): qty 0.5

## 2012-08-15 MED ORDER — DICLOFENAC POTASSIUM 50 MG PO TABS
50.0000 mg | ORAL_TABLET | Freq: Three times a day (TID) | ORAL | Status: DC | PRN
  Filled 2012-08-15: qty 1

## 2012-08-15 MED ORDER — FENOFIBRATE 54 MG PO TABS
54.0000 mg | ORAL_TABLET | Freq: Every day | ORAL | Status: DC
  Administered 2012-08-15 – 2012-08-17 (×3): 54 mg via ORAL
  Filled 2012-08-15 (×3): qty 1

## 2012-08-15 MED ORDER — MAGNESIUM 200 MG PO TABS: 1.0000 | ORAL_TABLET | Freq: Every day | ORAL | Status: DC

## 2012-08-15 MED ORDER — DOCUSATE SODIUM 100 MG PO CAPS
200.0000 mg | ORAL_CAPSULE | Freq: Two times a day (BID) | ORAL | Status: DC
  Administered 2012-08-15 – 2012-08-17 (×5): 200 mg via ORAL
  Filled 2012-08-15 (×6): qty 2

## 2012-08-15 MED ORDER — OMEGA-3-ACID ETHYL ESTERS 1 G PO CAPS
1.0000 g | ORAL_CAPSULE | Freq: Two times a day (BID) | ORAL | Status: DC
  Administered 2012-08-15 – 2012-08-17 (×5): 1 g via ORAL
  Filled 2012-08-15 (×6): qty 1

## 2012-08-15 NOTE — ED Notes (Signed)
Pt observed scratching rt forearm w/ her finger nails.  Pt is aware that she will have a sitter as long as needed for her safety.  Pt reported that she felt like she was safe and that she only "picked the scabs off."

## 2012-08-15 NOTE — ED Notes (Addendum)
Pt reports that she has "been scratching"--area on rt forearm is red, no new abrasions noted. Pt agreed not to scratch her self and to contact staff she could not comply. Ice pk to back area.

## 2012-08-15 NOTE — ED Notes (Signed)
Pt's father into see
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
## 2012-08-15 NOTE — ED Notes (Signed)
Up tot he bathroom to shower and change scrubs 

## 2012-08-15 NOTE — ED Notes (Signed)
Pt's father took her pink back pk home w/ him at the patients request

## 2012-08-15 NOTE — ED Notes (Signed)
Per pt she takes all night time medications at 2000, currently one is scheduled for 2000 and several are scheduled for 2200. Per Gershon Mussel, the pharmacist he will schedule all at 2000. Per pt request.

## 2012-08-15 NOTE — ED Notes (Signed)
Reading quietly in room

## 2012-08-15 NOTE — ED Notes (Signed)
She asked for peanut butter and cheese.

## 2012-08-15 NOTE — ED Notes (Addendum)
Nad, resting quietly, reports pain is "ok" if sitting still, but worse 8/10 w/ movement.  Ice continuing, EDP aware.  Per Sharyn Lull RN [employer] was observing the pt while she Sharyn Lull) was assisting another pt and the pt scratched the scab off of one of the old sites from her lt arm, no bleeding noted.  Pt apologized and reported that she was not sure why she had done it.

## 2012-08-15 NOTE — ED Notes (Signed)
RN reports pt purges after eating so she needs to be watched after meals. Pt said that she did have some incidents today in which she self injured but her and her father made a deal and she isn't going to scratch herself anymore. She's contracting for safety at this time and will tell RN if she has any urges. She is no longer a 1:1 at this time. Pt asked that all her medications be given at 2000 instead of some at 2000 and some at 2200. She also states that she charges hearing aid batteries every night. The one she charged last night were put in the case and secured in the activity room.

## 2012-08-15 NOTE — ED Notes (Signed)
Up in hall walking around, reports shower and hot pk did not help w/ back pain and stiffness.  Ice pk given.  Pt reports she was rx'd w/ pain medication the other day for the pain, and normally takes tramadol bid.
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
## 2012-08-15 NOTE — BHH Counselor (Signed)
Writer called pt's mother Brandi Love to ask re: seizure hx as RN as [facility] needed this info. Brandi Love [phone] says that pt has no hx of seizures that she is aware of. Mother asks to be kept informed of pt's disposition. Brandi Laming says that pt told pt's father that pt would be d/c from psych ed on Mon. Writer tells mother that Dr. Louretta Shorten would be here Mon to assess pt and she might be d/c or she might still need inpatient care. Writer tells mother that [facility] is also working on getting pt into [facility]. Mother says she is concerned as pt's meds are changed whenever she is inpatient and that pt's current meds seem to be working well.   Arnold Long, [place] Assessment Counselor

## 2012-08-15 NOTE — ED Notes (Signed)
Pt up to the activity room w/ security to change the batteries for her cocklear implant.

## 2012-08-15 NOTE — ED Notes (Signed)
Pt put hearing aid batteries on the charger, she said she will take them off in the morning.

## 2012-08-15 NOTE — ED Notes (Signed)
Reports pain still 9/10, nad

## 2012-08-15 NOTE — ED Notes (Signed)
Up to the bathroom 

## 2012-08-16 DIAGNOSIS — F329 Major depressive disorder, single episode, unspecified: Secondary | ICD-10-CM | POA: Diagnosis not present

## 2012-08-16 DIAGNOSIS — F3289 Other specified depressive episodes: Secondary | ICD-10-CM | POA: Diagnosis not present

## 2012-08-16 NOTE — ED Notes (Signed)
Up the the bathroom

## 2012-08-16 NOTE — ED Notes (Signed)
Patient sitting up in bed; no s/s of distress noted at this time. Patient asked this RN "Are you mad at me for the way I acted the other day?" This Probation officer informed pt that nothing taken personally. Pt remains apologetic to RN.

## 2012-08-16 NOTE — ED Provider Notes (Signed)
Nurses report patient has her batteries out of her cochlear implants to be recharged. Is asleep. Nurses report patient has an agreement with her father if she behaves better and doesn't try to cut herself or injure herself she will get a new car. Per [facility], pt has been accepted at [facility] at 5 am, but needs to wait for bed availability.   Rolland Porter, MD, FACEP   Janice Norrie, MD 08/16/12 [phone]

## 2012-08-16 NOTE — ED Notes (Signed)
Pt reports that she "purged" after eating.  Pt  Reports that she has started purging more frequently since the end of Feb.  Pt reports that her parents and her psych are aware and that they are trying to find her placement in a eating disorder treatment.

## 2012-08-16 NOTE — Progress Notes (Signed)
Pt on wait list with [facility]

## 2012-08-16 NOTE — ED Notes (Signed)
To the bathroom 

## 2012-08-17 DIAGNOSIS — F329 Major depressive disorder, single episode, unspecified: Secondary | ICD-10-CM | POA: Diagnosis not present

## 2012-08-17 DIAGNOSIS — F339 Major depressive disorder, recurrent, unspecified: Secondary | ICD-10-CM

## 2012-08-17 DIAGNOSIS — F603 Borderline personality disorder: Secondary | ICD-10-CM

## 2012-08-17 DIAGNOSIS — F3289 Other specified depressive episodes: Secondary | ICD-10-CM | POA: Diagnosis not present

## 2012-08-17 NOTE — ED Provider Notes (Signed)
Medical screening examination/treatment/procedure(s) were performed by non-physician practitioner and as supervising physician I was immediately available for consultation/collaboration.   Julianne Rice, MD 08/17/12 1344

## 2012-08-17 NOTE — Consult Note (Signed)
Reason for Consult: depression and suicidal thought and SIB Referring Physician: Dr. Marcy Siren Quinonez is an 37 y.o. female.  HPI: Patient hasn't been staying in her bed and reading a book and denies symptoms of depression anxiety and mania. Patient is most history of psychosis. Patient has been compliant with her medication without adverse effects. Patient has spoken with her father who visited her during this weekend and has a Surveyor, mining and also integument with the buying a new car for her when she stopped self-injurious behaviors. Patient has scratch from her forearms and reportedly this become a habit for her. She has been using her cochlear implants without difficulties. Patient has no problems with eating or sleeping. Patient does not exhibit any behavior problems. Patient is willing to participate outpatient psychiatric services including the ACT services from [facility].  MSE: Patient was awake alert oriented to time place person and situation. She was calm and quite uncooperative. Has taken no descending find her affect is appropriate. She has normal speech and thought process.Patient denies suicidal ideation intent or plan she has no homicidal ideation this plans is no evidence of psychotic symptoms.  Past Medical History  Diagnosis Date  . Eating disorder   . Anxiety   . Depression   . OCD (obsessive compulsive disorder)   . Pulmonary embolism   . Seizures   . Anemia   . Tachycardia   . Hypertension   . Chronic renal insufficiency   . 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 Endometriosis   . GERD (gastroesophageal reflux disease)   . Migraine   . Deafness     Past Surgical History  Procedure Laterality Date  . Cochlear implant    . Abdominal hysterectomy      'except for right ovary'  . Eye surgery Left 1980  . Eye surgery Right 1990  . Breast lumpectomy Left 2000    x2  . Breast lumpectomy Right 2002    benign  . Laparoscopic abdominal  exploration  2001 and 2002    for endometriosis    Family History  Problem Relation Age of Onset  . Cancer Father     Social History:  reports that she has never smoked. She does not have any smokeless tobacco history on file. She reports that she does not drink alcohol or use illicit drugs.  Allergies:  Allergies  Allergen Reactions  . Iodinated Diagnostic Agents Anaphylaxis    'cant breathe'  . Abilify (Aripiprazole) Nausea And Vomiting  . Dihydroergotamine Nausea And Vomiting  . Doxycycline Nausea And Vomiting  . Entex Lq (Phenylephrine-Guaifenesin) Other (See Comments)    unknown  . Erythromycin Nausea And Vomiting  . Lactose Intolerance (Gi) Nausea And Vomiting  . Nsaids     Unable to ttake due to renal insufficency  . Tape Other (See Comments)    Redness, can use paper tape    Medications: I have reviewed the patient's current medications.  No results found for this or any previous visit (from the past 48 hour(s)).  No results found.  Positive for bad mood, bipolar, borderline personality disorder, learning difficulty and mood swings Blood pressure 114/82, pulse 92, temperature 98.4 F (36.9 C), temperature source Oral, resp. rate 18, weight 154 lb (69.854 kg), SpO2 96.00%.   Assessment/Plan: Major depressive disorder, recurrent  Eating disorder by history  Borderline personality traits  Recommendations: Patient will be referred to the outpatient psychiatric services and ACT services at [facility] be  healing [facility]. Patient medications were not changed during this visit. Patient does not meet criteria for acute psychiatric hospitalization any longer as she is able to contract for safety and has contract with her father who seems to be supportive to her.   Texanna Hilburn,JANARDHAHA R. 08/17/2012, 11:21 AM

## 2012-08-17 NOTE — BHH Counselor (Signed)
Pt declined at [facility] b/c pt needs long term treatment. As of  4/28 @0900  per Marlowe Kays at [facility], pt is on wait list.

## 2012-08-17 NOTE — BHH Counselor (Signed)
Patient to be discharged home, per recommendations of psychiatrist Dr. Louretta Shorten. Writer met with patient and discussed her discharge/follow up plans. Patient receives therapeutic support with the [facility] ACT team. Patient consented for this writer to contact the ACT team and make them aware of her discharge. Her nursing case manager with the ACT team is Shelva Majestic # [phone]. Writer contacted [employer], however; she did not answer. Writer left a voicemail letting Claiborne Billings know that patient would be discharged today.   Patient also has has the ACT teams emergency # [phone] for any future crises if needed.   Patient has no other complaints at this time. Sts that her parents will pick her up from the ED at 2pm.
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
## 2012-08-17 NOTE — ED Provider Notes (Signed)
Pt on [place] wait list. Dr. Lenna Sciara to see today, no c/o overnight.  Johnna Acosta, MD 08/17/12 [phone]

## 2012-08-26 ENCOUNTER — Emergency Department (INDEPENDENT_AMBULATORY_CARE_PROVIDER_SITE_OTHER): Payer: Medicare Other

## 2012-08-26 ENCOUNTER — Encounter (HOSPITAL_COMMUNITY): Payer: Self-pay

## 2012-08-26 ENCOUNTER — Emergency Department (HOSPITAL_COMMUNITY): Payer: No Typology Code available for payment source

## 2012-08-26 ENCOUNTER — Emergency Department (INDEPENDENT_AMBULATORY_CARE_PROVIDER_SITE_OTHER)
Admission: EM | Admit: 2012-08-26 | Discharge: 2012-08-26 | Disposition: A | Discharge status: Other Acute Inpatient Hospital | Payer: Medicare Other | Source: Home / Self Care | Attending: Family Medicine | Admitting: Family Medicine

## 2012-08-26 ENCOUNTER — Encounter (HOSPITAL_COMMUNITY): Payer: Self-pay | Admitting: Emergency Medicine

## 2012-08-26 ENCOUNTER — Emergency Department (HOSPITAL_COMMUNITY)
Admission: EM | Admit: 2012-08-26 | Discharge: 2012-08-26 | Disposition: A | Discharge status: Home / Self Care | Payer: No Typology Code available for payment source | Attending: Emergency Medicine | Admitting: Emergency Medicine

## 2012-08-26 DIAGNOSIS — R079 Chest pain, unspecified: Secondary | ICD-10-CM | POA: Diagnosis present

## 2012-08-26 DIAGNOSIS — H9190 Unspecified hearing loss, unspecified ear: Secondary | ICD-10-CM | POA: Insufficient documentation

## 2012-08-26 DIAGNOSIS — N189 Chronic kidney disease, unspecified: Secondary | ICD-10-CM | POA: Insufficient documentation

## 2012-08-26 DIAGNOSIS — K219 Gastro-esophageal reflux disease without esophagitis: Secondary | ICD-10-CM | POA: Diagnosis not present

## 2012-08-26 DIAGNOSIS — R0602 Shortness of breath: Secondary | ICD-10-CM | POA: Diagnosis not present

## 2012-08-26 DIAGNOSIS — F429 Obsessive-compulsive disorder, unspecified: Secondary | ICD-10-CM | POA: Diagnosis not present

## 2012-08-26 DIAGNOSIS — F509 Eating disorder, unspecified: Secondary | ICD-10-CM | POA: Insufficient documentation

## 2012-08-26 DIAGNOSIS — Z862 Personal history of diseases of the blood and blood-forming organs and certain disorders involving the immune mechanism: Secondary | ICD-10-CM | POA: Diagnosis not present

## 2012-08-26 DIAGNOSIS — R000 Tachycardia, unspecified: Secondary | ICD-10-CM | POA: Insufficient documentation

## 2012-08-26 DIAGNOSIS — F3289 Other specified depressive episodes: Secondary | ICD-10-CM | POA: Diagnosis not present

## 2012-08-26 DIAGNOSIS — G40909 Epilepsy, unspecified, not intractable, without status epilepticus: Secondary | ICD-10-CM | POA: Insufficient documentation

## 2012-08-26 DIAGNOSIS — E039 Hypothyroidism, unspecified: Secondary | ICD-10-CM | POA: Diagnosis not present

## 2012-08-26 DIAGNOSIS — R109 Unspecified abdominal pain: Secondary | ICD-10-CM | POA: Insufficient documentation

## 2012-08-26 DIAGNOSIS — F329 Major depressive disorder, single episode, unspecified: Secondary | ICD-10-CM | POA: Diagnosis not present

## 2012-08-26 DIAGNOSIS — R0789 Other chest pain: Secondary | ICD-10-CM

## 2012-08-26 DIAGNOSIS — F411 Generalized anxiety disorder: Secondary | ICD-10-CM | POA: Diagnosis not present

## 2012-08-26 DIAGNOSIS — Z86711 Personal history of pulmonary embolism: Secondary | ICD-10-CM | POA: Diagnosis not present

## 2012-08-26 DIAGNOSIS — Z79899 Other long term (current) drug therapy: Secondary | ICD-10-CM | POA: Diagnosis not present

## 2012-08-26 DIAGNOSIS — I129 Hypertensive chronic kidney disease with stage 1 through stage 4 chronic kidney disease, or unspecified chronic kidney disease: Secondary | ICD-10-CM | POA: Insufficient documentation

## 2012-08-26 DIAGNOSIS — R071 Chest pain on breathing: Secondary | ICD-10-CM | POA: Insufficient documentation

## 2012-08-26 DIAGNOSIS — Z90710 Acquired absence of both cervix and uterus: Secondary | ICD-10-CM | POA: Diagnosis not present

## 2012-08-26 DIAGNOSIS — R110 Nausea: Secondary | ICD-10-CM | POA: Diagnosis not present

## 2012-08-26 DIAGNOSIS — G8911 Acute pain due to trauma: Secondary | ICD-10-CM | POA: Diagnosis not present

## 2012-08-26 DIAGNOSIS — G43909 Migraine, unspecified, not intractable, without status migrainosus: Secondary | ICD-10-CM | POA: Insufficient documentation

## 2012-08-26 DIAGNOSIS — Z8742 Personal history of other diseases of the female genital tract: Secondary | ICD-10-CM | POA: Diagnosis not present

## 2012-08-26 DIAGNOSIS — M549 Dorsalgia, unspecified: Secondary | ICD-10-CM | POA: Insufficient documentation

## 2012-08-26 LAB — CBC
HCT: 34.8 % — ABNORMAL LOW (ref 36.0–46.0)
Hemoglobin: 12.4 g/dL (ref 12.0–15.0)
MCH: 30.8 pg (ref 26.0–34.0)
MCHC: 35.6 g/dL (ref 30.0–36.0)
MCV: 86.6 fL (ref 78.0–100.0)
Platelets: 322 10*3/uL (ref 150–400)
RBC: 4.02 MIL/uL (ref 3.87–5.11)
RDW: 12.3 % (ref 11.5–15.5)
WBC: 9.8 10*3/uL (ref 4.0–10.5)

## 2012-08-26 LAB — POCT I-STAT, CHEM 8
BUN: 18 mg/dL (ref 6–23)
Calcium, Ion: 1.28 mmol/L — ABNORMAL HIGH (ref 1.12–1.23)
Chloride: 109 mEq/L (ref 96–112)
Creatinine, Ser: 1.4 mg/dL — ABNORMAL HIGH (ref 0.50–1.10)
Glucose, Bld: 100 mg/dL — ABNORMAL HIGH (ref 70–99)
HCT: 37 % (ref 36.0–46.0)
Hemoglobin: 12.6 g/dL (ref 12.0–15.0)
Potassium: 4.4 mEq/L (ref 3.5–5.1)
Sodium: 140 mEq/L (ref 135–145)
TCO2: 24 mmol/L (ref 0–100)

## 2012-08-26 LAB — D-DIMER, QUANTITATIVE: D-Dimer, Quant: 0.37 ug/mL-FEU (ref 0.00–0.48)

## 2012-08-26 MED ORDER — MORPHINE SULFATE 4 MG/ML IJ SOLN
4.0000 mg | Freq: Once | INTRAMUSCULAR | Status: AC
  Administered 2012-08-26: 4 mg via INTRAVENOUS
  Filled 2012-08-26: qty 1
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
## 2012-08-26 NOTE — ED Notes (Signed)
Pt left without d/c papers

## 2012-08-26 NOTE — ED Notes (Signed)
Pt returned from CT °

## 2012-08-26 NOTE — ED Notes (Signed)
chart review

## 2012-08-26 NOTE — ED Notes (Signed)
Pt voided before urine was ordered
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
## 2012-09-01 NOTE — ED Provider Notes (Signed)
Medical screening examination/treatment/procedure(s) were performed by non-physician practitioner and as supervising physician I was immediately available for consultation/collaboration.   Mirna Mires, MD 09/01/12 1057

## 2012-10-29 ENCOUNTER — Encounter (HOSPITAL_COMMUNITY): Payer: Self-pay | Admitting: *Deleted

## 2012-10-29 ENCOUNTER — Emergency Department (HOSPITAL_COMMUNITY)
Admission: EM | Admit: 2012-10-29 | Discharge: 2012-10-30 | Disposition: A | Discharge status: Home / Self Care | Payer: Medicare Other | Attending: Emergency Medicine | Admitting: Emergency Medicine

## 2012-10-29 DIAGNOSIS — Z8659 Personal history of other mental and behavioral disorders: Secondary | ICD-10-CM | POA: Insufficient documentation

## 2012-10-29 DIAGNOSIS — Z86711 Personal history of pulmonary embolism: Secondary | ICD-10-CM | POA: Insufficient documentation

## 2012-10-29 DIAGNOSIS — R45851 Suicidal ideations: Secondary | ICD-10-CM

## 2012-10-29 DIAGNOSIS — Z3202 Encounter for pregnancy test, result negative: Secondary | ICD-10-CM | POA: Insufficient documentation

## 2012-10-29 DIAGNOSIS — G43909 Migraine, unspecified, not intractable, without status migrainosus: Secondary | ICD-10-CM | POA: Insufficient documentation

## 2012-10-29 DIAGNOSIS — Z79899 Other long term (current) drug therapy: Secondary | ICD-10-CM | POA: Insufficient documentation

## 2012-10-29 DIAGNOSIS — E039 Hypothyroidism, unspecified: Secondary | ICD-10-CM | POA: Insufficient documentation

## 2012-10-29 DIAGNOSIS — F32A Depression, unspecified: Secondary | ICD-10-CM

## 2012-10-29 DIAGNOSIS — F329 Major depressive disorder, single episode, unspecified: Secondary | ICD-10-CM

## 2012-10-29 DIAGNOSIS — G40909 Epilepsy, unspecified, not intractable, without status epilepticus: Secondary | ICD-10-CM | POA: Insufficient documentation

## 2012-10-29 DIAGNOSIS — R109 Unspecified abdominal pain: Secondary | ICD-10-CM | POA: Insufficient documentation

## 2012-10-29 DIAGNOSIS — N189 Chronic kidney disease, unspecified: Secondary | ICD-10-CM | POA: Insufficient documentation

## 2012-10-29 DIAGNOSIS — F3289 Other specified depressive episodes: Secondary | ICD-10-CM | POA: Insufficient documentation

## 2012-10-29 DIAGNOSIS — Z8742 Personal history of other diseases of the female genital tract: Secondary | ICD-10-CM | POA: Insufficient documentation

## 2012-10-29 DIAGNOSIS — R632 Polyphagia: Secondary | ICD-10-CM | POA: Insufficient documentation

## 2012-10-29 DIAGNOSIS — K219 Gastro-esophageal reflux disease without esophagitis: Secondary | ICD-10-CM | POA: Insufficient documentation

## 2012-10-29 DIAGNOSIS — I129 Hypertensive chronic kidney disease with stage 1 through stage 4 chronic kidney disease, or unspecified chronic kidney disease: Secondary | ICD-10-CM | POA: Insufficient documentation

## 2012-10-29 DIAGNOSIS — Z862 Personal history of diseases of the blood and blood-forming organs and certain disorders involving the immune mechanism: Secondary | ICD-10-CM | POA: Insufficient documentation

## 2012-10-29 DIAGNOSIS — F411 Generalized anxiety disorder: Secondary | ICD-10-CM | POA: Insufficient documentation

## 2012-10-29 DIAGNOSIS — H9190 Unspecified hearing loss, unspecified ear: Secondary | ICD-10-CM | POA: Insufficient documentation

## 2012-10-29 DIAGNOSIS — R630 Anorexia: Secondary | ICD-10-CM | POA: Insufficient documentation

## 2012-10-29 DIAGNOSIS — F509 Eating disorder, unspecified: Secondary | ICD-10-CM | POA: Insufficient documentation
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
## 2012-10-30 DIAGNOSIS — F3130 Bipolar disorder, current episode depressed, mild or moderate severity, unspecified: Secondary | ICD-10-CM

## 2012-10-30 LAB — COMPREHENSIVE METABOLIC PANEL
ALT: 19 U/L (ref 0–35)
AST: 13 U/L (ref 0–37)
Albumin: 3.4 g/dL — ABNORMAL LOW (ref 3.5–5.2)
Alkaline Phosphatase: 73 U/L (ref 39–117)
CO2: 25 mEq/L (ref 19–32)
Chloride: 102 mEq/L (ref 96–112)
GFR calc non Af Amer: 44 mL/min — ABNORMAL LOW (ref >90)
Potassium: 4.2 mEq/L (ref 3.5–5.1)
Sodium: 138 mEq/L (ref 135–145)
Total Bilirubin: 0.2 mg/dL — ABNORMAL LOW (ref 0.3–1.2)

## 2012-10-30 LAB — ACETAMINOPHEN LEVEL: Acetaminophen (Tylenol), Serum: <15 ug/mL (ref 10–30)

## 2012-10-30 LAB — RAPID URINE DRUG SCREEN, HOSP PERFORMED
Amphetamines: NOT DETECTED
Benzodiazepines: NOT DETECTED
Tetrahydrocannabinol: NOT DETECTED

## 2012-10-30 LAB — CBC
Platelets: 245 10*3/uL (ref 150–400)
RBC: 4.18 MIL/uL (ref 3.87–5.11)
RDW: 13.3 % (ref 11.5–15.5)
WBC: 9.4 10*3/uL (ref 4.0–10.5)

## 2012-10-30 LAB — POCT PREGNANCY, URINE: Preg Test, Ur: NEGATIVE

## 2012-10-30 LAB — SALICYLATE LEVEL: Salicylate Lvl: <2 mg/dL — ABNORMAL LOW (ref 2.8–20.0)

## 2012-10-30 MED ORDER — ALUM & MAG HYDROXIDE-SIMETH 200-200-20 MG/5ML PO SUSP: 30.0000 mL | ORAL | Status: DC | PRN

## 2012-10-30 MED ORDER — ACETAMINOPHEN 325 MG PO TABS: 650.0000 mg | ORAL_TABLET | ORAL | Status: DC | PRN

## 2012-10-30 MED ORDER — NICOTINE 21 MG/24HR TD PT24: 21.0000 mg | MEDICATED_PATCH | Freq: Every day | TRANSDERMAL | Status: DC

## 2012-10-30 MED ORDER — ZOLPIDEM TARTRATE 5 MG PO TABS: 5.0000 mg | ORAL_TABLET | Freq: Every evening | ORAL | Status: DC | PRN

## 2012-10-30 MED ORDER — LORAZEPAM 1 MG PO TABS: 1.0000 mg | ORAL_TABLET | Freq: Three times a day (TID) | ORAL | Status: DC | PRN

## 2012-10-30 MED ORDER — ONDANSETRON HCL 4 MG PO TABS: 4.0000 mg | ORAL_TABLET | Freq: Three times a day (TID) | ORAL | Status: DC | PRN

## 2012-10-30 NOTE — ED Notes (Signed)
Pt transferred from [facility], IVC, presents for eval after feeling anxious, experiencing feelings of self harm.  Pt cut her wrists. Multiple psychiatric admissions in the past for depression.  Pt has been purging all of her foods for the last 5 days, inducing vomiting and using laxatives to lose weight. Pt calm & cooperative..  Pt wears bilateral hearing aids.

## 2012-10-30 NOTE — ED Notes (Addendum)
Pt. In new blue scrubs. Pt. And belongings searched and wanded by security. Pt. Has 1 black suitcase, 3 book bags. Pt. Has watch and hearing aid with battery and cell phone. Pt. Has shoes, socks, panty, and t-shirt.

## 2012-10-30 NOTE — ED Notes (Signed)
Couldn't get patient blood pressure but did get resp

## 2012-10-30 NOTE — ED Notes (Signed)
Pt. Has cut marks on both legs.notified RN,[place].
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
## 2012-10-30 NOTE — ED Notes (Signed)
On 15 min check patient found lying on the floor ?asleep. Was aroused and assisted to standing position. Assisted to bed. ACT team in to visit. Asked patient if she was ok and she shook her head up and down (yes).

## 2012-10-30 NOTE — ED Provider Notes (Addendum)
She is here for evaluation of suicidality. She states that yesterday. She was feeling increased stress and, carved on her wrists, to release the stress. She does this frequently. She usually cuts her anterior thighs with a razor to release stress. Currently, she is not actively suicidal. She would like to go home. She feels that her stress is manageable currently. She would like to go to her psychiatrist's office today.  She has not yet been seen by ACT or psychiatry here. Will await their evaluation.  Richarda Blade, MD 10/30/12 1035  Seen by psychiatry, who agreed to rescind her IVC, and recommended discharge home with followup as previously planned.  Richarda Blade, MD 10/30/12 [phone]

## 2012-10-30 NOTE — ED Provider Notes (Signed)
History    CSN: [id number] Arrival date & time 10/29/12  2337  First MD Initiated Contact with Patient 10/29/12 2359     Chief Complaint  Patient presents with  . Medical Clearance   (Consider location/radiation/quality/duration/timing/severity/associated sxs/prior Treatment) HPI Comments: Patient is a 37 year old female with a history of anxiety, depression, anorexia and bulimia, and borderline for disorder who presents for suicidal ideation. Patient states that she was feeling overwhelmed and depressed. This caused her to have thoughts of harming herself. Patient recognized these thoughts and called her ACT team crisis number for assistance. She spoke with this ACT team member who recommended that she be seen at [facility] for her suicidal thoughts. Patient states that she went home to pack her things before heading to [facility]. During this process patient left her phone downstairs. While she was upstairs in her room she was receiving texts from ACT members who were concerned about her state of mind. When patient did not respond to these texts, ACT called for police to go to the patient's home. Patient admits to self harm tonight; states that she superficially cut her wrists with a razor. Patient states that she does this to punish herself or eating too much. She also admits to taking 15 laxatives today and making herself vomit at least 3 times daily. Patient denies any medical complaints. She denies any current suicidal ideations and any homicidal ideations. She denies illicit drug use and alcohol use. Patient states her Lamictal dose was increased one week ago and she believes that this does increase has yet to take effect which is why she developed thoughts of suicide today.  The history is provided by the patient. No language interpreter was used.   Past Medical History  Diagnosis Date  . Eating disorder   . Anxiety   . Depression   . OCD (obsessive compulsive disorder)   . Pulmonary  embolism   . Seizures   . Anemia   . Tachycardia   . Hypertension   . Chronic renal insufficiency   . 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 Endometriosis   . GERD (gastroesophageal reflux disease)   . Migraine   . Deafness    Past Surgical History  Procedure Laterality Date  . Cochlear implant    . Abdominal hysterectomy      'except for right ovary'  . Eye surgery Left 1980  . Eye surgery Right 1990  . Breast lumpectomy Left 2000    x2  . Breast lumpectomy Right 2002    benign  . Laparoscopic abdominal exploration  2001 and 2002    for endometriosis   Family History  Problem Relation Age of Onset  . Cancer Father    History  Substance Use Topics  . Smoking status: Never Smoker   . Smokeless tobacco: Never Used  . Alcohol Use: No   OB History   Grav Para Term Preterm Abortions TAB SAB Ect Mult Living                 Review of Systems  Psychiatric/Behavioral: Positive for suicidal ideas.  All other systems reviewed and are negative.    Allergies  Iodinated diagnostic agents; Abilify; Dihydroergotamine; Doxycycline; Entex lq; Erythromycin; Lactose intolerance (gi); Nsaids; and Tape  Home Medications   Current Outpatient Rx  Name  Route  Sig  Dispense  Refill  . acetaminophen (TYLENOL) 500 MG tablet   Oral   Take 500 mg by mouth every 6 (  six) hours as needed for pain (headache).         . desvenlafaxine (PRISTIQ) 50 MG 24 hr tablet   Oral   Take 50 mg by mouth daily.         . diphenhydrAMINE (BENADRYL) 12.5 MG/5ML elixir   Oral   Take 12.5 mg by mouth 4 (four) times daily as needed (to alleviate twitches and cramping associated with Haldol).         . diphenhydrAMINE (BENADRYL) 25 mg capsule   Oral   Take 25 mg by mouth every 6 (six) hours as needed (to alleviate cramps and twitching when taking haldol).         Marland Kitchen docusate sodium (COLACE) 100 MG capsule   Oral   Take 200 mg by mouth 2 (two) times daily.         .  haloperidol (HALDOL) 5 MG tablet   Oral   Take 5 mg by mouth every 12 (twelve) hours as needed (for anxiety or going through crisis events).         . lactase (LACTAID) 3000 UNITS tablet   Oral   Take 1 tablet by mouth 3 (three) times daily with meals.         . lamoTRIgine (LAMICTAL) 25 MG tablet   Oral   Take 50 mg by mouth every morning.         Marland Kitchen levothyroxine (SYNTHROID, LEVOTHROID) 175 MCG tablet   Oral   Take 175 mcg by mouth daily.         Marland Kitchen LORazepam (ATIVAN) 0.5 MG tablet   Oral   Take 0.5 mg by mouth 2 (two) times daily. 12 p and 8 p         . magnesium oxide (MAG-OX) 400 MG tablet   Oral   Take 200 mg by mouth daily.         . Melatonin 3 MG TABS   Oral   Take 1 tablet by mouth at bedtime.          . methocarbamol (ROBAXIN) 750 MG tablet   Oral   Take 750 mg by mouth 3 (three) times daily.         . mirtazapine (REMERON) 30 MG tablet   Oral   Take 60 mg by mouth at bedtime.          . Multiple Vitamin (MULTIVITAMIN WITH MINERALS) TABS   Oral   Take 1 tablet by mouth daily.         Marland Kitchen omega-3 acid ethyl esters (LOVAZA) 1 G capsule   Oral   Take 2 g by mouth 2 (two) times daily.         Marland Kitchen omeprazole (PRILOSEC) 20 MG capsule   Oral   Take 20 mg by mouth daily.         . ondansetron (ZOFRAN) 4 MG tablet   Oral   Take 4 mg by mouth every 8 (eight) hours as needed for nausea.         . polyethylene glycol (MIRALAX / GLYCOLAX) packet   Oral   Take 17 g by mouth daily.         . pravastatin (PRAVACHOL) 20 MG tablet   Oral   Take 20 mg by mouth every evening.         . pregabalin (LYRICA) 25 MG capsule   Oral   Take 25 mg by mouth 3 (three) times daily.         Marland Kitchen  QUEtiapine (SEROQUEL) 100 MG tablet   Oral   Take 200 mg by mouth 2 (two) times daily. Takes at 8am, 12pm         . QUEtiapine (SEROQUEL) 400 MG tablet   Oral   Take 400 mg by mouth at bedtime.         . SUMAtriptan (IMITREX) 100 MG tablet   Oral    Take 50-100 mg by mouth every 2 (two) hours as needed for migraine (initial dose is 100mg , make take 50mg  every 2 hours if needed to relieve migraine.  No more than 200 mg total in 24 hours).         . Vitamin D, Ergocalciferol, (DRISDOL) 50000 UNITS CAPS   Oral   Take 50,000 Units by mouth every 7 (seven) days. On Sunday          BP 118/81  Pulse 103  Temp(Src) 97.8 F (36.6 C) (Oral)  Resp 16  SpO2 93% Physical Exam  Nursing note and vitals reviewed. Constitutional: She is oriented to person, place, and time. She appears well-developed and well-nourished. No distress.  HENT:  Head: Normocephalic and atraumatic.  Mouth/Throat: Oropharynx is clear and moist. No oropharyngeal exudate.  Eyes: Conjunctivae and EOM are normal. Pupils are equal, round, and reactive to light. No scleral icterus.  Neck: Normal range of motion.  Cardiovascular: Normal rate, regular rhythm and normal heart sounds.   Pulmonary/Chest: Effort normal and breath sounds normal. No respiratory distress. She has no wheezes. She has no rales.  Abdominal: Soft. Bowel sounds are normal. There is tenderness (chronic, at baseline). There is no rebound and no guarding.  Musculoskeletal: Normal range of motion.  Neurological: She is alert and oriented to person, place, and time.  Skin: Skin is warm and dry. No rash noted. She is not diaphoretic. No erythema.  Abrasions to bilateral wrists secondary to patient cutting herself with a razor. Areas scabbed. No active bleeding.  Psychiatric: She has a normal mood and affect. Her speech is normal and behavior is normal. Judgment normal. She expresses suicidal ideation. She expresses no homicidal ideation. She expresses no suicidal plans and no homicidal plans.    ED Course  Procedures (including critical care time) Labs Reviewed  COMPREHENSIVE METABOLIC PANEL - Abnormal; Notable for the following:    Glucose, Bld 108 (*)    Creatinine, Ser 1.51 (*)    Albumin 3.4 (*)     Total Bilirubin 0.2 (*)    GFR calc non Af Amer 44 (*)    GFR calc Af Amer 51 (*)    All other components within normal limits  SALICYLATE LEVEL - Abnormal; Notable for the following:    Salicylate Lvl 123456 (*)    All other components within normal limits  ACETAMINOPHEN LEVEL  CBC  ETHANOL  URINE RAPID DRUG SCREEN (HOSP PERFORMED)  POCT PREGNANCY, URINE   No results found.  1. Suicidal ideation 2. Depression 3. Eating Disorder  MDM  37 year old female who presents for suicidal ideation. Patient with intent to go to [facility] after speaking with a member of her ACT team via their crisis line. Patient without any medical complaints. Physical exam as above. Patient medically cleared. Will consult ACT team for further evaluation and management of suicidal thoughts.     Antonietta Breach, [place] 10/30/12 [phone]

## 2012-10-30 NOTE — ED Provider Notes (Signed)
Medical screening examination/treatment/procedure(s) were performed by non-physician practitioner and as supervising physician I was immediately available for consultation/collaboration.  Kalman Drape, MD 10/30/12 [phone]

## 2012-10-30 NOTE — ED Notes (Signed)
Pt has 2 hearing aides in ear w/ battery attached, then has 2 hearing aide chargers w/ 2 extra batteries charging in room at this time, sitter w/ pt.

## 2012-10-30 NOTE — ED Notes (Signed)
Pt has hearing aide in ear at this time.

## 2012-11-04 ENCOUNTER — Encounter (HOSPITAL_COMMUNITY): Payer: Self-pay | Admitting: Emergency Medicine

## 2012-11-04 ENCOUNTER — Emergency Department (INDEPENDENT_AMBULATORY_CARE_PROVIDER_SITE_OTHER)
Admission: EM | Admit: 2012-11-04 | Discharge: 2012-11-04 | Disposition: A | Discharge status: Home / Self Care | Payer: Medicare Other | Source: Home / Self Care

## 2012-11-04 DIAGNOSIS — R252 Cramp and spasm: Secondary | ICD-10-CM

## 2012-11-04 LAB — POCT I-STAT, CHEM 8
BUN: 16 mg/dL (ref 6–23)
Chloride: 102 mEq/L (ref 96–112)
Creatinine, Ser: 1.5 mg/dL — ABNORMAL HIGH (ref 0.50–1.10)
Sodium: 139 mEq/L (ref 135–145)
TCO2: 25 mmol/L (ref 0–100)

## 2012-11-04 NOTE — ED Notes (Signed)
Pt was told to come and have blood work done. States "having leg cramps" Pt has a hx of eating disorder. Voices no other concerns at this time.

## 2012-11-04 NOTE — ED Provider Notes (Signed)
Brandi Love is a 37 y.o. female who presents to Urgent Care today for leg cramping and muscle spasm. Patient has a past medical history significant for psychiatric illness as well as an eating disorder. She was seen by her psychiatrist today who noted muscle spasms and cramps. Her psychiatrist recommended that she present to the urgent care for electrolyte evaluation as a potential cause of her muscle cramps. She denies any radiating pain weakness numbness fevers or chills. The cramping has been present for about one week.    PMH reviewed. Psychiatric illness, as well as bilateral cochlear implants. Bulimia with an anorexia nervosa.  History  Substance Use Topics  . Smoking status: Never Smoker   . Smokeless tobacco: Never Used  . Alcohol Use: No   ROS as above Medications reviewed. No current facility-administered medications for this encounter.   Current Outpatient Prescriptions  Medication Sig Dispense Refill  . acetaminophen (TYLENOL) 500 MG tablet Take 500 mg by mouth every 6 (six) hours as needed for pain (headache).      . desvenlafaxine (PRISTIQ) 50 MG 24 hr tablet Take 50 mg by mouth daily.      . diphenhydrAMINE (BENADRYL) 12.5 MG/5ML elixir Take 12.5 mg by mouth 4 (four) times daily as needed (to alleviate twitches and cramping associated with Haldol).      . diphenhydrAMINE (BENADRYL) 25 mg capsule Take 25 mg by mouth every 6 (six) hours as needed (to alleviate cramps and twitching when taking haldol).      Marland Kitchen docusate sodium (COLACE) 100 MG capsule Take 200 mg by mouth 2 (two) times daily.      . haloperidol (HALDOL) 5 MG tablet Take 5 mg by mouth every 12 (twelve) hours as needed (for anxiety or going through crisis events).      . lactase (LACTAID) 3000 UNITS tablet Take 1 tablet by mouth 3 (three) times daily with meals.      . lamoTRIgine (LAMICTAL) 25 MG tablet Take 50 mg by mouth every morning.      Marland Kitchen levothyroxine (SYNTHROID, LEVOTHROID) 175 MCG tablet Take 175 mcg by  mouth daily.      Marland Kitchen LORazepam (ATIVAN) 0.5 MG tablet Take 0.5 mg by mouth 2 (two) times daily. 12 p and 8 p      . magnesium oxide (MAG-OX) 400 MG tablet Take 200 mg by mouth daily.      . Melatonin 3 MG TABS Take 1 tablet by mouth at bedtime.       . methocarbamol (ROBAXIN) 750 MG tablet Take 750 mg by mouth 3 (three) times daily.      . mirtazapine (REMERON) 30 MG tablet Take 60 mg by mouth at bedtime.       . Multiple Vitamin (MULTIVITAMIN WITH MINERALS) TABS Take 1 tablet by mouth daily.      Marland Kitchen omega-3 acid ethyl esters (LOVAZA) 1 G capsule Take 2 g by mouth 2 (two) times daily.      Marland Kitchen omeprazole (PRILOSEC) 20 MG capsule Take 20 mg by mouth daily.      . ondansetron (ZOFRAN) 4 MG tablet Take 4 mg by mouth every 8 (eight) hours as needed for nausea.      . polyethylene glycol (MIRALAX / GLYCOLAX) packet Take 17 g by mouth daily.      . pravastatin (PRAVACHOL) 20 MG tablet Take 20 mg by mouth every evening.      . pregabalin (LYRICA) 25 MG capsule Take 25 mg by mouth 3 (three)  times daily.      . QUEtiapine (SEROQUEL) 100 MG tablet Take 200 mg by mouth 2 (two) times daily. Takes at 8am, 12pm      . QUEtiapine (SEROQUEL) 400 MG tablet Take 400 mg by mouth at bedtime.      . SUMAtriptan (IMITREX) 100 MG tablet Take 50-100 mg by mouth every 2 (two) hours as needed for migraine (initial dose is 100mg , make take 50mg  every 2 hours if needed to relieve migraine.  No more than 200 mg total in 24 hours).      . Vitamin D, Ergocalciferol, (DRISDOL) 50000 UNITS CAPS Take 50,000 Units by mouth every 7 (seven) days. On Sunday        Exam:  BP 152/103  Pulse 85  Temp(Src) 98.4 F (36.9 C) (Oral)  Resp 16  SpO2 97% Gen: Well NAD HEENT: EOMI,  MMM Lungs: CTABL Nl WOB Heart: RRR no MRG Exts: Non edematous BL  LE, warm and well perfused.   Results for orders placed during the hospital encounter of 11/04/12 (from the past 24 hour(s))  POCT I-STAT, CHEM 8     Status: Abnormal   Collection Time     07 /16/14  7:46 PM      Result Value Range   Sodium 139  135 - 145 mEq/L   Potassium 4.4  3.5 - 5.1 mEq/L   Chloride 102  96 - 112 mEq/L   BUN 16  6 - 23 mg/dL   Creatinine, Ser 1.50 (*) 0.50 - 1.10 mg/dL   Glucose, Bld 93  70 - 99 mg/dL   Calcium, Ion 1.21  1.12 - 1.23 mmol/L   TCO2 25  0 - 100 mmol/L   Hemoglobin 16.0 (*) 12.0 - 15.0 g/dL   HCT 47.0 (*) 36.0 - 46.0 %   No results found.  Assessment and Plan: 37 y.o. female with eating disorder and multiple psychiatric disorders with leg cramps intermittently. Normal-appearing electrolytes. Creatinine is elevated but unchanged from prior values.  Her cramps are likely caused by medication side effects. Plan to followup with primary care provider and psychiatrist to work this up further .  Discussed warning signs or symptoms. Please see discharge instructions. Patient expresses understanding.       Gregor Hams, MD 11/04/12 [phone]

## 2012-11-04 NOTE — ED Notes (Signed)
Chart review.

## 2012-11-12 ENCOUNTER — Encounter (HOSPITAL_COMMUNITY): Payer: Self-pay | Admitting: *Deleted

## 2012-11-12 ENCOUNTER — Inpatient Hospital Stay (HOSPITAL_COMMUNITY)
Admission: EM | Admit: 2012-11-12 | Discharge: 2012-11-18 | DRG: 682 | Disposition: A | Discharge status: Home / Self Care | Payer: Medicare Other | Attending: Internal Medicine | Admitting: Internal Medicine

## 2012-11-12 DIAGNOSIS — N189 Chronic kidney disease, unspecified: Secondary | ICD-10-CM | POA: Diagnosis present

## 2012-11-12 DIAGNOSIS — D72829 Elevated white blood cell count, unspecified: Secondary | ICD-10-CM | POA: Diagnosis present

## 2012-11-12 DIAGNOSIS — N289 Disorder of kidney and ureter, unspecified: Secondary | ICD-10-CM

## 2012-11-12 DIAGNOSIS — E43 Unspecified severe protein-calorie malnutrition: Secondary | ICD-10-CM | POA: Diagnosis present

## 2012-11-12 DIAGNOSIS — F603 Borderline personality disorder: Secondary | ICD-10-CM | POA: Diagnosis present

## 2012-11-12 DIAGNOSIS — K219 Gastro-esophageal reflux disease without esophagitis: Secondary | ICD-10-CM | POA: Diagnosis present

## 2012-11-12 DIAGNOSIS — F329 Major depressive disorder, single episode, unspecified: Secondary | ICD-10-CM | POA: Diagnosis present

## 2012-11-12 DIAGNOSIS — F429 Obsessive-compulsive disorder, unspecified: Secondary | ICD-10-CM | POA: Diagnosis present

## 2012-11-12 DIAGNOSIS — N183 Chronic kidney disease, stage 3 unspecified: Secondary | ICD-10-CM

## 2012-11-12 DIAGNOSIS — R1013 Epigastric pain: Secondary | ICD-10-CM | POA: Diagnosis present

## 2012-11-12 DIAGNOSIS — R112 Nausea with vomiting, unspecified: Secondary | ICD-10-CM

## 2012-11-12 DIAGNOSIS — E039 Hypothyroidism, unspecified: Secondary | ICD-10-CM | POA: Diagnosis present

## 2012-11-12 DIAGNOSIS — F3289 Other specified depressive episodes: Secondary | ICD-10-CM | POA: Diagnosis present

## 2012-11-12 DIAGNOSIS — E871 Hypo-osmolality and hyponatremia: Secondary | ICD-10-CM | POA: Diagnosis present

## 2012-11-12 DIAGNOSIS — R197 Diarrhea, unspecified: Secondary | ICD-10-CM | POA: Diagnosis present

## 2012-11-12 DIAGNOSIS — E876 Hypokalemia: Secondary | ICD-10-CM | POA: Diagnosis present

## 2012-11-12 DIAGNOSIS — R339 Retention of urine, unspecified: Secondary | ICD-10-CM | POA: Diagnosis present

## 2012-11-12 DIAGNOSIS — I129 Hypertensive chronic kidney disease with stage 1 through stage 4 chronic kidney disease, or unspecified chronic kidney disease: Secondary | ICD-10-CM | POA: Diagnosis present

## 2012-11-12 DIAGNOSIS — Z86711 Personal history of pulmonary embolism: Secondary | ICD-10-CM

## 2012-11-12 DIAGNOSIS — N179 Acute kidney failure, unspecified: Secondary | ICD-10-CM | POA: Diagnosis present

## 2012-11-12 DIAGNOSIS — Z79899 Other long term (current) drug therapy: Secondary | ICD-10-CM

## 2012-11-12 DIAGNOSIS — F411 Generalized anxiety disorder: Secondary | ICD-10-CM | POA: Diagnosis present

## 2012-11-12 DIAGNOSIS — E860 Dehydration: Secondary | ICD-10-CM | POA: Diagnosis present

## 2012-11-12 DIAGNOSIS — F502 Bulimia nervosa: Secondary | ICD-10-CM

## 2012-11-12 DIAGNOSIS — IMO0002 Reserved for concepts with insufficient information to code with codable children: Secondary | ICD-10-CM

## 2012-11-12 LAB — CBC WITH DIFFERENTIAL/PLATELET
Eosinophils Absolute: 0.1 10*3/uL (ref 0.0–0.7)
Eosinophils Relative: 1 % (ref 0–5)
HCT: 45.8 % (ref 36.0–46.0)
Hemoglobin: 16.3 g/dL — ABNORMAL HIGH (ref 12.0–15.0)
Lymphocytes Relative: 20 % (ref 12–46)
Lymphs Abs: 2.7 10*3/uL (ref 0.7–4.0)
MCH: 30.9 pg (ref 26.0–34.0)
MCV: 86.9 fL (ref 78.0–100.0)
Monocytes Absolute: 1.7 10*3/uL — ABNORMAL HIGH (ref 0.1–1.0)
Monocytes Relative: 13 % — ABNORMAL HIGH (ref 3–12)
RBC: 5.27 MIL/uL — ABNORMAL HIGH (ref 3.87–5.11)
WBC: 13.5 10*3/uL — ABNORMAL HIGH (ref 4.0–10.5)

## 2012-11-12 LAB — COMPREHENSIVE METABOLIC PANEL
ALT: 24 U/L (ref 0–35)
ALT: 21 U/L (ref 0–35)
Alkaline Phosphatase: 68 U/L (ref 39–117)
BUN: 40 mg/dL — ABNORMAL HIGH (ref 6–23)
BUN: 36 mg/dL — ABNORMAL HIGH (ref 6–23)
CO2: 24 mEq/L (ref 19–32)
CO2: 19 mEq/L (ref 19–32)
Calcium: 9.7 mg/dL (ref 8.4–10.5)
Calcium: 7.9 mg/dL — ABNORMAL LOW (ref 8.4–10.5)
Creatinine, Ser: 2.51 mg/dL — ABNORMAL HIGH (ref 0.50–1.10)
GFR calc Af Amer: 28 mL/min — ABNORMAL LOW (ref >90)
GFR calc Af Amer: 35 mL/min — ABNORMAL LOW (ref >90)
GFR calc non Af Amer: 24 mL/min — ABNORMAL LOW (ref >90)
GFR calc non Af Amer: 30 mL/min — ABNORMAL LOW (ref >90)
Glucose, Bld: 114 mg/dL — ABNORMAL HIGH (ref 70–99)
Glucose, Bld: 107 mg/dL — ABNORMAL HIGH (ref 70–99)
Potassium: 4.2 mEq/L (ref 3.5–5.1)
Sodium: 132 mEq/L — ABNORMAL LOW (ref 135–145)
Sodium: 134 mEq/L — ABNORMAL LOW (ref 135–145)
Total Protein: 7.6 g/dL (ref 6.0–8.3)
Total Protein: 5.8 g/dL — ABNORMAL LOW (ref 6.0–8.3)

## 2012-11-12 LAB — POCT PREGNANCY, URINE: Preg Test, Ur: NEGATIVE

## 2012-11-12 LAB — MAGNESIUM: Magnesium: 2.9 mg/dL — ABNORMAL HIGH (ref 1.5–2.5)

## 2012-11-12 MED ORDER — LAMOTRIGINE 25 MG PO TABS
50.0000 mg | ORAL_TABLET | Freq: Every morning | ORAL | Status: DC
  Administered 2012-11-13 – 2012-11-18 (×6): 50 mg via ORAL
  Filled 2012-11-12 (×6): qty 2

## 2012-11-12 MED ORDER — SIMVASTATIN 10 MG PO TABS
10.0000 mg | ORAL_TABLET | Freq: Every day | ORAL | Status: DC
  Administered 2012-11-12 – 2012-11-14 (×3): 10 mg via ORAL
  Filled 2012-11-12 (×3): qty 1

## 2012-11-12 MED ORDER — HYDROCODONE-ACETAMINOPHEN 5-325 MG PO TABS
1.0000 | ORAL_TABLET | ORAL | Status: DC | PRN
Start: 2012-11-12 — End: 2012-11-18

## 2012-11-12 MED ORDER — OMEGA-3-ACID ETHYL ESTERS 1 G PO CAPS
2.0000 g | ORAL_CAPSULE | Freq: Two times a day (BID) | ORAL | Status: DC
  Administered 2012-11-12 – 2012-11-18 (×12): 2 g via ORAL
  Filled 2012-11-12 (×13): qty 2

## 2012-11-12 MED ORDER — DIPHENHYDRAMINE HCL 12.5 MG/5ML PO ELIX: 12.5000 mg | ORAL_SOLUTION | Freq: Four times a day (QID) | ORAL | Status: DC | PRN

## 2012-11-12 MED ORDER — SODIUM CHLORIDE 0.9 % IV SOLN
INTRAVENOUS | Status: DC
  Administered 2012-11-12 – 2012-11-18 (×6): via INTRAVENOUS

## 2012-11-12 MED ORDER — QUETIAPINE FUMARATE 200 MG PO TABS
200.0000 mg | ORAL_TABLET | Freq: Two times a day (BID) | ORAL | Status: DC
  Filled 2012-11-12: qty 1

## 2012-11-12 MED ORDER — SODIUM CHLORIDE 0.9 % IJ SOLN
3.0000 mL | Freq: Two times a day (BID) | INTRAMUSCULAR | Status: DC
  Administered 2012-11-14 – 2012-11-16 (×2): 3 mL via INTRAVENOUS

## 2012-11-12 MED ORDER — ENOXAPARIN SODIUM 30 MG/0.3ML ~~LOC~~ SOLN
30.0000 mg | SUBCUTANEOUS | Status: DC
  Filled 2012-11-12: qty 0.3

## 2012-11-12 MED ORDER — LOPERAMIDE HCL 2 MG PO CAPS: 4.0000 mg | ORAL_CAPSULE | ORAL | Status: DC | PRN

## 2012-11-12 MED ORDER — LACTASE 3000 UNITS PO TABS
1.0000 | ORAL_TABLET | Freq: Three times a day (TID) | ORAL | Status: DC
  Filled 2012-11-12 (×5): qty 1

## 2012-11-12 MED ORDER — ONDANSETRON HCL 4 MG/2ML IJ SOLN
4.0000 mg | Freq: Once | INTRAMUSCULAR | Status: AC
  Administered 2012-11-12: 4 mg via INTRAVENOUS
  Filled 2012-11-12: qty 2

## 2012-11-12 MED ORDER — PREGABALIN 25 MG PO CAPS
25.0000 mg | ORAL_CAPSULE | Freq: Four times a day (QID) | ORAL | Status: DC
  Administered 2012-11-12 – 2012-11-18 (×21): 25 mg via ORAL
  Filled 2012-11-12 (×21): qty 1

## 2012-11-12 MED ORDER — MAGNESIUM OXIDE 400 (241.3 MG) MG PO TABS
200.0000 mg | ORAL_TABLET | Freq: Every day | ORAL | Status: DC
  Administered 2012-11-13 – 2012-11-18 (×6): 200 mg via ORAL
  Filled 2012-11-12 (×6): qty 0.5

## 2012-11-12 MED ORDER — ENOXAPARIN SODIUM 40 MG/0.4ML ~~LOC~~ SOLN
40.0000 mg | SUBCUTANEOUS | Status: DC
  Administered 2012-11-12 – 2012-11-17 (×6): 40 mg via SUBCUTANEOUS
  Filled 2012-11-12 (×7): qty 0.4

## 2012-11-12 MED ORDER — SODIUM CHLORIDE 0.9 % IV BOLUS (SEPSIS)
2000.0000 mL | Freq: Once | INTRAVENOUS | Status: AC
  Administered 2012-11-12: 2000 mL via INTRAVENOUS

## 2012-11-12 MED ORDER — SODIUM CHLORIDE 0.9 % IV SOLN: INTRAVENOUS | Status: DC

## 2012-11-12 MED ORDER — ONDANSETRON HCL 4 MG PO TABS
4.0000 mg | ORAL_TABLET | Freq: Four times a day (QID) | ORAL | Status: DC | PRN
  Administered 2012-11-13 – 2012-11-14 (×2): 4 mg via ORAL
  Filled 2012-11-12 (×2): qty 1

## 2012-11-12 MED ORDER — SUMATRIPTAN SUCCINATE 50 MG PO TABS
50.0000 mg | ORAL_TABLET | ORAL | Status: DC | PRN
  Filled 2012-11-12: qty 2

## 2012-11-12 MED ORDER — HALOPERIDOL 5 MG PO TABS: 5.0000 mg | ORAL_TABLET | Freq: Two times a day (BID) | ORAL | Status: DC | PRN

## 2012-11-12 MED ORDER — ACETAMINOPHEN 500 MG PO TABS: 500.0000 mg | ORAL_TABLET | Freq: Four times a day (QID) | ORAL | Status: DC | PRN

## 2012-11-12 MED ORDER — LORAZEPAM 0.5 MG PO TABS
0.5000 mg | ORAL_TABLET | Freq: Two times a day (BID) | ORAL | Status: DC
  Administered 2012-11-12 – 2012-11-18 (×12): 0.5 mg via ORAL
  Filled 2012-11-12 (×12): qty 1

## 2012-11-12 MED ORDER — QUETIAPINE FUMARATE 200 MG PO TABS
200.0000 mg | ORAL_TABLET | Freq: Two times a day (BID) | ORAL | Status: DC
  Administered 2012-11-13 – 2012-11-18 (×12): 200 mg via ORAL
  Filled 2012-11-12 (×13): qty 1

## 2012-11-12 MED ORDER — VENLAFAXINE HCL ER 37.5 MG PO CP24
37.5000 mg | ORAL_CAPSULE | Freq: Every day | ORAL | Status: DC
  Administered 2012-11-13 – 2012-11-18 (×6): 37.5 mg via ORAL
  Filled 2012-11-12 (×7): qty 1

## 2012-11-12 MED ORDER — MIRTAZAPINE 30 MG PO TABS
60.0000 mg | ORAL_TABLET | Freq: Every day | ORAL | Status: DC
  Administered 2012-11-12 – 2012-11-17 (×6): 60 mg via ORAL
  Filled 2012-11-12 (×7): qty 2

## 2012-11-12 MED ORDER — ONDANSETRON HCL 4 MG/2ML IJ SOLN
4.0000 mg | Freq: Four times a day (QID) | INTRAMUSCULAR | Status: DC | PRN
  Administered 2012-11-13 – 2012-11-17 (×5): 4 mg via INTRAVENOUS
  Filled 2012-11-12 (×5): qty 2

## 2012-11-12 MED ORDER — PANTOPRAZOLE SODIUM 40 MG PO TBEC: 40.0000 mg | DELAYED_RELEASE_TABLET | Freq: Every day | ORAL | Status: DC

## 2012-11-12 MED ORDER — LEVOTHYROXINE SODIUM 175 MCG PO TABS
175.0000 ug | ORAL_TABLET | Freq: Every day | ORAL | Status: DC
  Administered 2012-11-13 – 2012-11-18 (×6): 175 ug via ORAL
  Filled 2012-11-12 (×7): qty 1

## 2012-11-12 MED ORDER — METHOCARBAMOL 750 MG PO TABS
750.0000 mg | ORAL_TABLET | Freq: Three times a day (TID) | ORAL | Status: DC
  Administered 2012-11-12 – 2012-11-18 (×17): 750 mg via ORAL
  Filled 2012-11-12 (×19): qty 1

## 2012-11-12 MED ORDER — QUETIAPINE FUMARATE 400 MG PO TABS
400.0000 mg | ORAL_TABLET | Freq: Every day | ORAL | Status: DC
  Administered 2012-11-12 – 2012-11-17 (×6): 400 mg via ORAL
  Filled 2012-11-12 (×8): qty 1

## 2012-11-12 MED ORDER — PANTOPRAZOLE SODIUM 40 MG PO TBEC
40.0000 mg | DELAYED_RELEASE_TABLET | Freq: Every day | ORAL | Status: DC
  Administered 2012-11-13 – 2012-11-18 (×6): 40 mg via ORAL
  Filled 2012-11-12 (×6): qty 1

## 2012-11-12 MED ORDER — ZOLPIDEM TARTRATE 5 MG PO TABS
5.0000 mg | ORAL_TABLET | Freq: Every evening | ORAL | Status: DC | PRN
  Administered 2012-11-12: 5 mg via ORAL
  Filled 2012-11-12: qty 1

## 2012-11-12 NOTE — ED Notes (Signed)
Pt states she was at pcp this morning, was sent to ED for dehydration. Did not have labs drawn. Pt reports feeling weak, dizzy, shaky, and diaphoresis. C/o headache, states she could not eat this morning.

## 2012-11-12 NOTE — H&P (Signed)
[facility] History and Physical  Brandi Love [id number] DOB: Apr 12, 1976 DOA: 11/12/2012  Referring physician: ED physician PCP: Benito Mccreedy, MD   Chief Complaint: Abdominal pain, vomiting and diarrhea   HPI:  Pt is 37 yo female with significant psychiatric illnesses including depression, personality disorder, eating disorder, recent admissions to [facility] for suicidal attempt in April and July 2014, now presents to [facility] ED with main concern of progressively worsening generalized abdominal pain, intermittent in nature and non radiating, 10/10 in severity when present and associated with nausea, non bloody vomiting, non bloody and watery diarrhea. Pt reports similar events in the past but is unsure what has caused it. She denies recent sick contacts or exposures, no recent changes in medications, no recent antibiotics use. She also reports associated dizziness worse with standing and feeling as she will pass out. She denies fevers, chills, no specific urinary concerns, no specific focal neurological symptoms.   In ED, pt with persistent nausea and vomiting, Hg 16 and indicative of dehydration, mild leukocytosis and WBC of 13 K, acute renal failure with Cr 2.51. Review of records indicate baseline creatinine 1.2 - 1.6 in April - July 2014. [facility] asked to admit for further evaluation.      Assessment and Plan: Principal Problem:   Abdominal pain, epigastric with nausea, vomiting, diarrhea - unclear etiology at this time, pt is on colace and miralax at home but denies recent use of laxatives - will admit to telemetry bed and will provide supportive care with IVF, analgesia and antiemetics as needed - stop laxatives as I am not sure if pt has been taking at home or not - will check stool for O&P, stool culture - will also check C. Diff by PCR  Active Problems:   Dehydration - evident by hemoconcentration and acute elevation of creatinine above baseline - will provide IVF and repeat CBC  and BMP in AM   Acute on chronic renal failure - secondary to pre renal etiology - IVF as noted above and repeat BMP in AM, will hold off on renal US for now as etiology is likely pre renal and will improve with IVF   Leukocytosis, unspecified - secondary to principal problem but exact etiology unclear - follow up on C. DIff, stool studies  - no need for ABX at this time as no clear infectious cause identified    Hyponatremia - secondary to pre renal etiology - IVF as mentioned above    Borderline personality disorder - appears to be stable at this time   Code Status: Full Family Communication: Pt at bedside Disposition Plan: Admit to telemetry bed   Review of Systems:  Constitutional: Negative for fever, chills. Negative for diaphoresis.  HENT: Negative for hearing loss, ear pain, nosebleeds, congestion, sore throat, neck pain, tinnitus and ear discharge.   Eyes: Negative for blurred vision, double vision, photophobia, pain, discharge and redness.  Respiratory: Negative for cough, hemoptysis, sputum production, shortness of breath, wheezing and stridor.   Cardiovascular: Negative for chest pain, palpitations, orthopnea, claudication and leg swelling.  Gastrointestinal: Positive for nausea, vomiting and abdominal pain. Negative for heartburn, constipation, blood in stool and melena.  Genitourinary: Negative for dysuria, urgency, frequency, hematuria and flank pain.  Musculoskeletal: Negative for myalgias, back pain, joint pain and falls.  Skin: Negative for itching and rash.  Neurological: Negative for tingling, tremors, sensory change, speech change, focal weakness, loss of consciousness and headaches.  Endo/Heme/Allergies: Negative for environmental allergies and polydipsia. Does not bruise/bleed easily.  Psychiatric/Behavioral:  Negative for suicidal ideas. The patient is not nervous/anxious.      Past Medical History  Diagnosis Date  . Eating disorder   . Anxiety   .  Depression   . OCD (obsessive compulsive disorder)   . Pulmonary embolism   . Seizures   . Anemia   . Tachycardia   . Hypertension   . Chronic renal insufficiency   . 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 Endometriosis   . GERD (gastroesophageal reflux disease)   . Migraine   . Deafness     Past Surgical History  Procedure Laterality Date  . Cochlear implant    . Abdominal hysterectomy      'except for right ovary'  . Eye surgery Left 1980  . Eye surgery Right 1990  . Breast lumpectomy Left 2000    x2  . Breast lumpectomy Right 2002    benign  . Laparoscopic abdominal exploration  2001 and 2002    for endometriosis    Social History:  reports that she has never smoked. She has never used smokeless tobacco. She reports that she does not drink alcohol or use illicit drugs.  Allergies  Allergen Reactions  . Iodinated Diagnostic Agents Anaphylaxis    'cant breathe'  . Abilify (Aripiprazole) Nausea And Vomiting  . Dihydroergotamine Nausea And Vomiting  . Doxycycline Nausea And Vomiting  . Entex Lq (Phenylephrine-Guaifenesin) Other (See Comments)    unknown  . Erythromycin Nausea And Vomiting  . Lactose Intolerance (Gi) Nausea And Vomiting  . Nsaids     Unable to ttake due to renal insufficency  . Tape Other (See Comments)    Redness, can use paper tape    Family History  Problem Relation Age of Onset  . Cancer Father     Prior to Admission medications   Medication Sig Start Date End Date Taking? Authorizing Provider  acetaminophen (TYLENOL) 500 MG tablet Take 500 mg by mouth every 6 (six) hours as needed for pain (headache).   Yes Historical Provider, MD  desvenlafaxine (PRISTIQ) 50 MG 24 hr tablet Take 50 mg by mouth daily.   Yes Historical Provider, MD  diphenhydrAMINE (BENADRYL) 12.5 MG/5ML elixir Take 12.5 mg by mouth 4 (four) times daily as needed (to alleviate twitches and cramping associated with Haldol).   Yes Historical Provider, MD   diphenhydrAMINE (BENADRYL) 25 mg capsule Take 25 mg by mouth every 6 (six) hours as needed (to alleviate cramps and twitching when taking haldol).   Yes Historical Provider, MD  docusate sodium (COLACE) 100 MG capsule Take 200 mg by mouth 2 (two) times daily.   Yes Historical Provider, MD  haloperidol (HALDOL) 5 MG tablet Take 5 mg by mouth every 12 (twelve) hours as needed (for anxiety or going through crisis events).   Yes Historical Provider, MD  lactase (LACTAID) 3000 UNITS tablet Take 1 tablet by mouth 3 (three) times daily with meals.   Yes Historical Provider, MD  lamoTRIgine (LAMICTAL) 25 MG tablet Take 50 mg by mouth every morning.   Yes Historical Provider, MD  levothyroxine (SYNTHROID, LEVOTHROID) 175 MCG tablet Take 175 mcg by mouth daily.   Yes Historical Provider, MD  LORazepam (ATIVAN) 0.5 MG tablet Take 0.5 mg by mouth 2 (two) times daily. 12 p and 8 p   Yes Historical Provider, MD  magnesium oxide (MAG-OX) 400 MG tablet Take 200 mg by mouth daily.   Yes Historical Provider, MD  Melatonin 3 MG TABS Take  1 tablet by mouth at bedtime.    Yes Historical Provider, MD  methocarbamol (ROBAXIN) 750 MG tablet Take 750 mg by mouth 3 (three) times daily.   Yes Historical Provider, MD  mirtazapine (REMERON) 30 MG tablet Take 60 mg by mouth at bedtime.    Yes Historical Provider, MD  Multiple Vitamin (MULTIVITAMIN WITH MINERALS) TABS Take 1 tablet by mouth daily.   Yes Historical Provider, MD  omega-3 acid ethyl esters (LOVAZA) 1 G capsule Take 2 g by mouth 2 (two) times daily.   Yes Historical Provider, MD  omeprazole (PRILOSEC) 20 MG capsule Take 20 mg by mouth daily.   Yes Historical Provider, MD  ondansetron (ZOFRAN) 4 MG tablet Take 4 mg by mouth every 8 (eight) hours as needed for nausea.   Yes Historical Provider, MD  polyethylene glycol (MIRALAX / GLYCOLAX) packet Take 17 g by mouth daily.   Yes Historical Provider, MD  pravastatin (PRAVACHOL) 20 MG tablet Take 20 mg by mouth every  evening.   Yes Historical Provider, MD  pregabalin (LYRICA) 25 MG capsule Take 25 mg by mouth 4 (four) times daily.    Yes Historical Provider, MD  QUEtiapine (SEROQUEL) 100 MG tablet Take 200 mg by mouth 2 (two) times daily. Takes at [place], 12pm   Yes Historical Provider, MD  QUEtiapine (SEROQUEL) 400 MG tablet Take 400 mg by mouth at bedtime.   Yes Historical Provider, MD  SUMAtriptan (IMITREX) 100 MG tablet Take 50-100 mg by mouth every 2 (two) hours as needed for migraine (initial dose is 100mg , make take 50mg  every 2 hours if needed to relieve migraine.  No more than 200 mg total in 24 hours).   Yes Historical Provider, MD  Vitamin D, Ergocalciferol, (DRISDOL) 50000 UNITS CAPS Take 50,000 Units by mouth every 7 (seven) days. On Sunday   Yes Historical Provider, MD    Physical Exam: Filed Vitals:   11/12/12 1346  BP: 119/78  Pulse: 126  Temp: 98.9 F (37.2 C)  TempSrc: Oral  Resp: 20  SpO2: 98%    Physical Exam  Constitutional: Appears well-developed and well-nourished. No distress.  HENT: Normocephalic. External right and left ear normal. Dry MM  Eyes: Conjunctivae and EOM are normal. PERRLA, no scleral icterus.  Neck: Normal ROM. Neck supple. No JVD. No tracheal deviation. No thyromegaly.  CVS: Regular rhythm, tachycardic, S1/S2 +, no murmurs, no gallops, no carotid bruit.  Pulmonary: Effort and breath sounds normal, no stridor, rhonchi, wheezes, rales.  Abdominal: Soft. BS +,  no distension, tenderness in epigastric area, no rebound or guarding.  Musculoskeletal: Normal range of motion. No edema and no tenderness.  Lymphadenopathy: No lymphadenopathy noted, cervical, inguinal. Neuro: Alert. Normal reflexes, muscle tone coordination. No cranial nerve deficit. Skin: Skin is warm and dry. No rash noted. Not diaphoretic. No erythema. No pallor.  Psychiatric: Normal mood and affect. Behavior, judgment, thought content normal.   Labs on Admission:  Basic Metabolic Panel:  Recent  Labs Lab 11/12/12 1510  NA 132*  K 3.7  CL 91*  CO2 24  GLUCOSE 114*  BUN 40*  CREATININE 2.51*  CALCIUM 9.7   Liver Function Tests:  Recent Labs Lab 11/12/12 1510  AST 21  ALT 24  ALKPHOS 96  BILITOT 0.3  PROT 7.6  ALBUMIN 4.1    Recent Labs Lab 11/12/12 1510  LIPASE 50   CBC:  Recent Labs Lab 11/12/12 1510  WBC 13.5*  NEUTROABS 8.9*  HGB 16.3*  HCT 45.8  MCV  86.9  PLT 328    Radiological Exams on Admission: No results found.  EKG: Normal sinus rhythm, no ST/T wave changes  Faye Ramsay, MD  [facility] Pager [phone]  If 7PM-7AM, please contact night-coverage [email or web address] Password [facility] 11/12/2012, 5:56 PM

## 2012-11-12 NOTE — ED Provider Notes (Signed)
CSN: [id number]     Arrival date & time 11/12/12  1325 History     First MD Initiated Contact with Patient 11/12/12 1513     Chief Complaint  Patient presents with  . Dehydration   (Consider location/radiation/quality/duration/timing/severity/associated sxs/prior Treatment) HPI Comments: Brandi Love is a 37 y.o. Female who is here for evaluation of recurrent vomiting, and diarrhea. She saw her doctor earlier in the week, then again today, and was sent here for likely admission. She has chronic abdominal pain and feels like her abdominal pain is present. Today is typical of her abdominal discomfort. She follows a pain clinic for treatment. He denies fever, chills, cough, shortness of breath, chest pain or leg pain. She feels weak and dizzy when she stands up. She has had a fall, but luckily her mother caught her before she has to the floor; so there was no injury. She denies headache, blurred vision, sinus congestion, or ear pain. She's taking her usual medications, without relief. She was recently hospitalized for suicidality. She denies suicide thoughts at this time. She feels like her "mental illness." It is stable and not troubling her. There are no other known modifying factors.  The history is provided by the patient.    Past Medical History  Diagnosis Date  . Eating disorder   . Anxiety   . Depression   . OCD (obsessive compulsive disorder)   . Pulmonary embolism   . Seizures   . Anemia   . Tachycardia   . Hypertension   . Chronic renal insufficiency   . 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 Endometriosis   . GERD (gastroesophageal reflux disease)   . Migraine   . Deafness    Past Surgical History  Procedure Laterality Date  . Cochlear implant    . Abdominal hysterectomy      'except for right ovary'  . Eye surgery Left 1980  . Eye surgery Right 1990  . Breast lumpectomy Left 2000    x2  . Breast lumpectomy Right 2002    benign  . Laparoscopic  abdominal exploration  2001 and 2002    for endometriosis   Family History  Problem Relation Age of Onset  . Cancer Father    History  Substance Use Topics  . Smoking status: Never Smoker   . Smokeless tobacco: Never Used  . Alcohol Use: No   OB History   Grav Para Term Preterm Abortions TAB SAB Ect Mult Living                 Review of Systems  All other systems reviewed and are negative.    Allergies  Iodinated diagnostic agents; Abilify; Dihydroergotamine; Doxycycline; Entex lq; Erythromycin; Lactose intolerance (gi); Nsaids; and Tape  Home Medications   No current outpatient prescriptions on file. BP 104/71  Pulse 109  Temp(Src) 98.4 F (36.9 C) (Oral)  Resp 18  Ht 5\' 8"  (1.727 m)  Wt 158 lb 15.2 oz (72.1 kg)  BMI 24.17 kg/m2  SpO2 98% Physical Exam  Nursing note and vitals reviewed. Constitutional: She is oriented to person, place, and time. She appears well-developed and well-nourished.  HENT:  Head: Normocephalic and atraumatic.  Eyes: Conjunctivae and EOM are normal. Pupils are equal, round, and reactive to light.  Neck: Normal range of motion and phonation normal. Neck supple.  Cardiovascular: Normal rate, regular rhythm and intact distal pulses.   Pulmonary/Chest: Effort normal and breath sounds normal. She exhibits  no tenderness.  Abdominal: Soft. She exhibits no distension and no mass. There is tenderness (mild right and left upper quadrant tenderness). There is no rebound and no guarding.  Musculoskeletal: Normal range of motion. She exhibits no edema and no tenderness.  Neurological: She is alert and oriented to person, place, and time. She has normal strength. She exhibits normal muscle tone. Coordination normal.  Skin: Skin is warm and dry.  Psychiatric: She has a normal mood and affect. Her behavior is normal. Judgment and thought content normal.    ED Course   Medications  QUEtiapine (SEROQUEL) tablet 400 mg (400 mg Oral Given 11/12/12 2223)   venlafaxine XR (EFFEXOR-XR) 24 hr capsule 37.5 mg (not administered)  levothyroxine (SYNTHROID, LEVOTHROID) tablet 175 mcg (not administered)  mirtazapine (REMERON) tablet 60 mg (60 mg Oral Given 11/12/12 2222)  SUMAtriptan (IMITREX) tablet 50-100 mg (not administered)  haloperidol (HALDOL) tablet 5 mg (not administered)  lactase (LACTAID) tablet 3,000 Units (not administered)  acetaminophen (TYLENOL) tablet 500 mg (not administered)  diphenhydrAMINE (BENADRYL) 12.5 MG/5ML elixir 12.5 mg (not administered)  LORazepam (ATIVAN) tablet 0.5 mg (0.5 mg Oral Given 11/12/12 2222)  pregabalin (LYRICA) capsule 25 mg (25 mg Oral Given 11/12/12 2226)  methocarbamol (ROBAXIN) tablet 750 mg (750 mg Oral Given 11/12/12 2233)  lamoTRIgine (LAMICTAL) tablet 50 mg (not administered)  magnesium oxide (MAG-OX) tablet 200 mg (not administered)  sodium chloride 0.9 % injection 3 mL (3 mLs Intravenous Not Given 11/12/12 2200)  0.9 %  sodium chloride infusion ( Intravenous New Bag/Given 11/12/12 2029)  HYDROcodone-acetaminophen (NORCO/VICODIN) 5-325 MG per tablet 1-2 tablet (not administered)  ondansetron (ZOFRAN) tablet 4 mg (not administered)    Or  ondansetron (ZOFRAN) injection 4 mg (not administered)  pantoprazole (PROTONIX) EC tablet 40 mg (not administered)  QUEtiapine (SEROQUEL) tablet 200 mg (not administered)  zolpidem (AMBIEN) tablet 5 mg (5 mg Oral Given 11/12/12 2223)  omega-3 acid ethyl esters (LOVAZA) capsule 2 g (2 g Oral Given 11/12/12 2222)  simvastatin (ZOCOR) tablet 10 mg (10 mg Oral Given 11/12/12 2224)  enoxaparin (LOVENOX) injection 40 mg (40 mg Subcutaneous Given 11/12/12 2222)  sodium chloride 0.9 % bolus 2,000 mL (2,000 mLs Intravenous New Bag/Given 11/12/12 1604)  ondansetron (ZOFRAN) injection 4 mg (4 mg Intravenous Given 11/12/12 1605)    Patient Vitals for the past 24 hrs:  BP Temp Temp src Pulse Resp SpO2 Height Weight  11/12/12 2106 104/71 mmHg 98.4 F (36.9 C) Oral 109 18 98 % - -   11/12/12 1836 118/82 mmHg 98.2 F (36.8 C) Oral 108 16 100 % 5\' 8"  (1.727 m) 158 lb 15.2 oz (72.1 kg)  11/12/12 1800 115/82 mmHg - - 108 14 98 % - -  11/12/12 1700 107/71 mmHg - - 113 13 97 % - -  11/12/12 1346 119/78 mmHg 98.9 F (37.2 C) Oral 126 20 98 % - -     Office records obtained from her primary care doctor indicate that; and she was seen on 11/09/12 and fell at her bulimia was acting up. This occurred despite taking her usual medications. On that visit. She was given tramadol for pain and referred to GI for a possible endoscope evaluation. She apparently returned there today for evaluation, but the note was not sent over.    5:20 PM-Consult complete with Dr. Romilda Joy. Patient case explained and discussed. She agrees to admit patient for further evaluation and treatment. Call ended at 1726    Date: 11/12/12  Rate: 122  Rhythm:  sinus tachycardia  QRS Axis: normal  PR and QT Intervals: normal  ST/T Wave abnormalities: nonspecific T wave changes  PR and QRS Conduction Disutrbances:none  Narrative Interpretation:   Old EKG Reviewed: changes noted since 08/26/12- rate faster and nonspecific T wave changes, are new   Procedures (including critical care time)  Labs Reviewed  CBC WITH DIFFERENTIAL - Abnormal; Notable for the following:    WBC 13.5 (*)    RBC 5.27 (*)    Hemoglobin 16.3 (*)    Neutro Abs 8.9 (*)    Monocytes Relative 13 (*)    Monocytes Absolute 1.7 (*)    All other components within normal limits  COMPREHENSIVE METABOLIC PANEL - Abnormal; Notable for the following:    Sodium 132 (*)    Chloride 91 (*)    Glucose, Bld 114 (*)    BUN 40 (*)    Creatinine, Ser 2.51 (*)    GFR calc non Af Amer 24 (*)    GFR calc Af Amer 28 (*)    All other components within normal limits  COMPREHENSIVE METABOLIC PANEL - Abnormal; Notable for the following:    Sodium 134 (*)    Glucose, Bld 107 (*)    BUN 36 (*)    Creatinine, Ser 2.06 (*)    Calcium 7.9 (*)     Total Protein 5.8 (*)    Albumin 2.9 (*)    Total Bilirubin 0.2 (*)    GFR calc non Af Amer 30 (*)    GFR calc Af Amer 35 (*)    All other components within normal limits  MAGNESIUM - Abnormal; Notable for the following:    Magnesium 2.9 (*)    All other components within normal limits  STOOL CULTURE  OVA AND PARASITE EXAMINATION  CLOSTRIDIUM DIFFICILE BY PCR  LIPASE, BLOOD  PHOSPHORUS  TSH  BASIC METABOLIC PANEL  CBC  URINE RAPID DRUG SCREEN (HOSP PERFORMED)  POCT PREGNANCY, URINE    1. Nausea and vomiting   2. Acute renal insufficiency   3. Diarrhea   4. Bulimia     MDM  Ongoing vomiting, with new renal insufficiency. Likely, moderate dehydration. She will require admission for further IV fluid treatment, and further assessment for her bulimia and persistent, vomiting.  Nursing Notes Reviewed/ Care Coordinated, and agree without changes. Applicable Imaging Reviewed.  Interpretation of Laboratory Data incorporated into ED treatment  Plan: Admit   Richarda Blade, MD 11/13/12 0002

## 2012-11-12 NOTE — ED Notes (Signed)
Pt sts was sent here by her PCP because "I'm severely dehydrated." Pt reports having eating disorder with purging. Pt reports having mild headache and abdominal pain d/t "throwing up all the time". Pt also sts PA at her PCP office told her she will be admitted and that EDP is aware of it.

## 2012-11-13 ENCOUNTER — Inpatient Hospital Stay (HOSPITAL_COMMUNITY): Payer: Medicare Other

## 2012-11-13 ENCOUNTER — Encounter (HOSPITAL_COMMUNITY): Payer: Self-pay

## 2012-11-13 DIAGNOSIS — N179 Acute kidney failure, unspecified: Principal | ICD-10-CM

## 2012-11-13 DIAGNOSIS — R1013 Epigastric pain: Secondary | ICD-10-CM

## 2012-11-13 DIAGNOSIS — N189 Chronic kidney disease, unspecified: Secondary | ICD-10-CM

## 2012-11-13 DIAGNOSIS — E43 Unspecified severe protein-calorie malnutrition: Secondary | ICD-10-CM | POA: Insufficient documentation

## 2012-11-13 LAB — CBC
MCH: 30.9 pg (ref 26.0–34.0)
MCHC: 34.8 g/dL (ref 30.0–36.0)
MCV: 88.9 fL (ref 78.0–100.0)
Platelets: 213 10*3/uL (ref 150–400)
RDW: 13 % (ref 11.5–15.5)

## 2012-11-13 LAB — BASIC METABOLIC PANEL
CO2: 25 mEq/L (ref 19–32)
Calcium: 7.6 mg/dL — ABNORMAL LOW (ref 8.4–10.5)
Creatinine, Ser: 1.76 mg/dL — ABNORMAL HIGH (ref 0.50–1.10)
GFR calc non Af Amer: 37 mL/min — ABNORMAL LOW (ref >90)
Glucose, Bld: 83 mg/dL (ref 70–99)
Sodium: 137 mEq/L (ref 135–145)

## 2012-11-13 LAB — CLOSTRIDIUM DIFFICILE BY PCR: Toxigenic C. Difficile by PCR: NEGATIVE

## 2012-11-13 LAB — RAPID URINE DRUG SCREEN, HOSP PERFORMED
Amphetamines: NOT DETECTED
Opiates: NOT DETECTED

## 2012-11-13 MED ORDER — ADULT MULTIVITAMIN W/MINERALS CH
1.0000 | ORAL_TABLET | Freq: Every day | ORAL | Status: DC
  Administered 2012-11-13 – 2012-11-18 (×6): 1 via ORAL
  Filled 2012-11-13 (×6): qty 1

## 2012-11-13 MED ORDER — POTASSIUM CHLORIDE CRYS ER 20 MEQ PO TBCR
20.0000 meq | EXTENDED_RELEASE_TABLET | Freq: Once | ORAL | Status: AC
  Administered 2012-11-13: 20 meq via ORAL
  Filled 2012-11-13: qty 1

## 2012-11-13 MED ORDER — LACTASE 9000 UNITS PO CHEW
9000.0000 [IU] | CHEWABLE_TABLET | Freq: Three times a day (TID) | ORAL | Status: DC
  Administered 2012-11-13 – 2012-11-18 (×15): 9000 [IU] via ORAL
  Filled 2012-11-13 (×20): qty 1

## 2012-11-13 MED ORDER — MELATONIN 3 MG PO TABS: 3.0000 mg | ORAL_TABLET | Freq: Every evening | ORAL | Status: DC | PRN

## 2012-11-13 MED ORDER — ENSURE COMPLETE PO LIQD
237.0000 mL | Freq: Three times a day (TID) | ORAL | Status: DC
  Administered 2012-11-13 – 2012-11-18 (×14): 237 mL via ORAL

## 2012-11-13 NOTE — Progress Notes (Signed)
[facility] PROGRESS NOTE  Brandi Love [id number] DOB: 04/11/1976 DOA: 11/12/2012 PCP: Benito Mccreedy, MD  Assessment/Plan:  1-Abdominal pain, epigastric with nausea, vomiting, diarrhea  - Patient confirm use of laxatives. Continue with support care.  -laxative stopped.  -Continue with IV fluids, antiemetic.  -Start clear diet.   -stool for O&P, stool culture, C. Diff by PCR ordered.   Urine retention: Bladder scan. Check UA, culture. Renal US.   Dehydration  - Continue with IV fluids.   Acute on chronic renal failure  - secondary to pre renal etiology. Patient with urinary retention will order renal US.  - Cr peak to 2.5, Cr decrease to 1.7.   Leukocytosis, unspecified  -likely de margination. WBC normalized.   Hyponatremia  - secondary to pre renal etiology  -Resolved with IV fluids.   Borderline personality disorder  - appears to be stable at this time  -Continue with multiple medications. -Will avoid Ambien. Patient was very sedated with this medication.    Code Status: Full.  Family Communication: Care discussed with patient.  Disposition Plan: remain inpatient. Home at time of discharge.    Consultants:  None  Procedures:  Renal US ordered.   Antibiotics:  none  HPI/Subjective: [name]  She is woking on her eating disorders. She is following with a therapist.  She feels a little better. She still have abdominal pain but is better. She has had urine retention for last couple of days.   Objective: Filed Vitals:   11/12/12 1800 11/12/12 1836 11/12/12 2106 11/13/12 0526  BP: 115/82 118/82 104/71 94/60  Pulse: 108 108 109 96  Temp:  98.2 F (36.8 C) 98.4 F (36.9 C) 97.5 F (36.4 C)  TempSrc:  Oral Oral Axillary  Resp: 14 16 18 16   Height:  5\' 8"  (1.727 m)    Weight:  72.1 kg (158 lb 15.2 oz)    SpO2: 98% 100% 98% 95%    Intake/Output Summary  (Last 24 hours) at 11/13/12 1309 Last data filed at 11/13/12 0600  Gross per 24 hour  Intake   1500 ml  Output      0 ml  Net   1500 ml   Filed Weights   11/12/12 1836  Weight: 72.1 kg (158 lb 15.2 oz)    Exam:   General:  No distress.   Cardiovascular: S 1, S 2 RRR  Respiratory: CTA  Abdomen: Bs present, soft, generalized tenderness, no rigidity, no guarding.   Musculoskeletal: no edema.   Data Reviewed: Basic Metabolic Panel:  Recent Labs Lab 11/12/12 1510 11/12/12 1907 11/13/12 0515  NA 132* 134* 137  K 3.7 4.2 3.4*  CL 91* 100 105  CO2 24 19 25   GLUCOSE 114* 107* 83  BUN 40* 36* 30*  CREATININE 2.51* 2.06* 1.76*  CALCIUM 9.7 7.9* 7.6*  MG  --  2.9*  --   PHOS  --  3.9  --    Liver Function Tests:  Recent Labs Lab 11/12/12 1510 11/12/12 1907  AST 21 33  ALT 24 21  ALKPHOS 96 68  BILITOT 0.3 0.2*  PROT 7.6 5.8*  ALBUMIN 4.1 2.9*    Recent Labs Lab 11/12/12 1510  LIPASE 50   No results found for this basename: AMMONIA,  in the last 168 hours CBC:  Recent Labs Lab 11/12/12 1510 11/13/12 0515  WBC 13.5* 6.9  NEUTROABS 8.9*  --   HGB  16.3* 12.0  HCT 45.8 34.5*  MCV 86.9 88.9  PLT 328 213   Cardiac Enzymes: No results found for this basename: CKTOTAL, CKMB, CKMBINDEX, TROPONINI,  in the last 168 hours BNP (last 3 results) No results found for this basename: PROBNP,  in the last 8760 hours CBG: No results found for this basename: GLUCAP,  in the last 168 hours  No results found for this or any previous visit (from the past 240 hour(s)).   Studies: No results found.  Scheduled Meds: . enoxaparin (LOVENOX) injection  40 mg Subcutaneous Q24H  . Lactase  9,000 Units Oral TID WC  . lamoTRIgine  50 mg Oral q morning - 10a  . levothyroxine  175 mcg Oral QAC breakfast  . LORazepam  0.5 mg Oral BID  . magnesium oxide  200 mg Oral Daily  . methocarbamol  750 mg Oral TID  . mirtazapine  60 mg Oral QHS  . omega-3 acid ethyl esters  2 g  Oral BID  . pantoprazole  40 mg Oral Daily  . potassium chloride  20 mEq Oral Once  . pregabalin  25 mg Oral QID  . QUEtiapine  200 mg Oral BID WC  . QUEtiapine  400 mg Oral QHS  . simvastatin  10 mg Oral q1800  . sodium chloride  3 mL Intravenous Q12H  . venlafaxine XR  37.5 mg Oral Q breakfast   Continuous Infusions: . sodium chloride 125 mL/hr at 11/12/12 2029    Principal Problem:   Abdominal pain, epigastric Active Problems:   Dehydration   Acute on chronic renal failure   Leukocytosis, unspecified   Hyponatremia   Borderline personality disorder    Time spent: 35 minutes.    David Rodriquez  [facility] Pager [phone]. If 7PM-7AM, please contact night-coverage at [email or web address], password [facility] 11/13/2012, 1:09 PM  LOS: 1 day

## 2012-11-13 NOTE — Progress Notes (Signed)
Pt hadn't voided since being transferred to the unit. Had pt attempt to void, but was unsuccessful. Bladder scanned her at 0320 and found 428mls in bladder. Paged on call PA and received orders to do an in and out cath. Pt had taken 5mg  Ambien at HS and had become extremely lethargic and could not fully be awaken after being placed back in bed. Check vitals Bp 97/62 P100 O2 95%.  Will continue to monitor pt.

## 2012-11-13 NOTE — Progress Notes (Signed)
INITIAL NUTRITION ASSESSMENT  Pt meets criteria for severe MALNUTRITION in the context of chronic illness as evidenced by <75% estimated energy intake in the past month with 6% weight loss in the past 2-3 weeks per pt report in addition to pt with severe muscle wasting in upper arms.  DOCUMENTATION CODES Per approved criteria  -Severe  malnutrition in the context of chronic illness   INTERVENTION: - Multivitamin 1 tablet PO daily - Ensure Complete TID - Educated pt on recommended solid foods to start eating when diet advanced due to pt with prolonged nausea/vomiting - Recommend MD continue to monitor potassium, phosphorus, and magnesium as PO intake improves and replete as needed (daily for the next 3 days) - Strongly encouraged pt to follow up with her eating disorder therapist upon discharge. Pt stated she was motivated about getting help for her eating disorder.  - Recommend nursing monitor pt for 30 minutes after meals as diet advances to see if pt will have purging. Pt agreed with this recommendation because she wants help with her eating disorder.  - Will continue to monitor   NUTRITION DIAGNOSIS: Predicted suboptimal energy intake related to eating disorder as evidenced by pt statement.   Goal: 1. No further nausea/vomiting 2. Pt to consume >90% of meals/supplements  Monitor:  Weights, labs, intake, diet advancement  Reason for Assessment: Nutrition risk  37 y.o. female  Admitting Dx: Abdominal pain, epigastric  ASSESSMENT: Pt admitted with abdominal pain, vomiting, and diarrhea. Pt with history of eating disorder. Met with pt who reports she has had an eating disorder since high school and it has occurred on/off since then. States that physical and emotional stress trigger it. Reports she just got an eating disorder therapist that she plans to meet with at discharge. States at home she eats "whatever feels good" but that no matter what she ate in the past week everything  came up, more than 5 emesis/day. Pt reports she had nausea for the past week but that Zofran was helping. She feels like she hasn't eaten well in so long that her body is rejecting food. Pt reports since Monday she has been on a liquid diet of lots of apple juice, Yoo[employer]o chocolate drink, and 2-3 Ensure/day and states that all of this was staying down, only solid food made her vomit. Pt reports 10 pound unintended weight loss in the past 2-3 weeks. Pt denies any nausea/vomiting today and wants to try full liquid diet - paged MD with this request and diet advanced. Noted pt using multiple laxatives at home.   Pt with low potassium, WNL phosphorus, and slightly elevated magnesium.   Height: Ht Readings from Last 1 Encounters:  11/12/12 5\' 8"  (1.727 m)    Weight: Wt Readings from Last 1 Encounters:  11/12/12 158 lb 15.2 oz (72.1 kg)    Ideal Body Weight: 140 lb  % Ideal Body Weight: 113%  Wt Readings from Last 10 Encounters:  11/12/12 158 lb 15.2 oz (72.1 kg)  08/13/12 154 lb (69.854 kg)    Usual Body Weight: 168 lb per pt  % Usual Body Weight: 94%  BMI:  Body mass index is 24.17 kg/(m^2).  Estimated Nutritional Needs: Kcal: 1800-2000 Protein: 72-86g Fluid: 1.8-2L/day  Skin: Intact  Diet Order: Full liquid  EDUCATION NEEDS: -Education needs addressed - educated pt on diet therapy for nausea/vomiting   Intake/Output Summary (Last 24 hours) at 11/13/12 1848 Last data filed at 11/13/12 0600  Gross per 24 hour  Intake  1500 ml  Output      0 ml  Net   1500 ml    Last BM: 7/24  Labs:   Recent Labs Lab 11/12/12 1510 11/12/12 1907 11/13/12 0515  NA 132* 134* 137  K 3.7 4.2 3.4*  CL 91* 100 105  CO2 24 19 25   BUN 40* 36* 30*  CREATININE 2.51* 2.06* 1.76*  CALCIUM 9.7 7.9* 7.6*  MG  --  2.9*  --   PHOS  --  3.9  --   GLUCOSE 114* 107* 83    CBG (last 3)  No results found for this basename: GLUCAP,  in the last 72 hours  Scheduled Meds: . enoxaparin  (LOVENOX) injection  40 mg Subcutaneous Q24H  . Lactase  9,000 Units Oral TID WC  . lamoTRIgine  50 mg Oral q morning - 10a  . levothyroxine  175 mcg Oral QAC breakfast  . LORazepam  0.5 mg Oral BID  . magnesium oxide  200 mg Oral Daily  . methocarbamol  750 mg Oral TID  . mirtazapine  60 mg Oral QHS  . omega-3 acid ethyl esters  2 g Oral BID  . pantoprazole  40 mg Oral Daily  . potassium chloride  20 mEq Oral Once  . pregabalin  25 mg Oral QID  . QUEtiapine  200 mg Oral BID WC  . QUEtiapine  400 mg Oral QHS  . simvastatin  10 mg Oral q1800  . sodium chloride  3 mL Intravenous Q12H  . venlafaxine XR  37.5 mg Oral Q breakfast    Continuous Infusions: . sodium chloride 125 mL/hr at 11/12/12 2029    Past Medical History  Diagnosis Date  . Eating disorder   . Anxiety   . Depression   . OCD (obsessive compulsive disorder)   . Pulmonary embolism   . Seizures   . Anemia   . Tachycardia   . Hypertension   . Chronic renal insufficiency   . 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 Endometriosis   . GERD (gastroesophageal reflux disease)   . Migraine   . Deafness     Past Surgical History  Procedure Laterality Date  . Cochlear implant    . Abdominal hysterectomy      'except for right ovary'  . Eye surgery Left 1980  . Eye surgery Right 1990  . Breast lumpectomy Left 2000    x2  . Breast lumpectomy Right 2002    benign  . Laparoscopic abdominal exploration  2001 and 2002    for endometriosis    Mikey College MS, [place], [place] Pager [phone] After Hours Pager

## 2012-11-14 LAB — BASIC METABOLIC PANEL
BUN: 19 mg/dL (ref 6–23)
Calcium: 7.7 mg/dL — ABNORMAL LOW (ref 8.4–10.5)
GFR calc non Af Amer: 49 mL/min — ABNORMAL LOW (ref >90)
Glucose, Bld: 76 mg/dL (ref 70–99)
Sodium: 138 mEq/L (ref 135–145)

## 2012-11-14 LAB — T4, FREE: Free T4: 1.74 ng/dL (ref 0.80–1.80)

## 2012-11-14 LAB — CBC
HCT: 32.8 % — ABNORMAL LOW (ref 36.0–46.0)
Hemoglobin: 11.1 g/dL — ABNORMAL LOW (ref 12.0–15.0)
MCH: 30.2 pg (ref 26.0–34.0)
MCHC: 33.8 g/dL (ref 30.0–36.0)
RDW: 12.8 % (ref 11.5–15.5)

## 2012-11-14 LAB — MAGNESIUM: Magnesium: 2.7 mg/dL — ABNORMAL HIGH (ref 1.5–2.5)

## 2012-11-14 LAB — T3, FREE: T3, Free: 2.4 pg/mL (ref 2.3–4.2)

## 2012-11-14 NOTE — Progress Notes (Signed)
[facility] PROGRESS NOTE  Brandi Love [id number] DOB: 08-Jan-1976 DOA: 11/12/2012 PCP: Benito Mccreedy, MD  Assessment/Plan:  1-Abdominal pain, epigastric with nausea, vomiting, diarrhea  - Patient confirm use of laxatives. Continue with support care.  -Abdominal pain improved, had 5 BM yesterday none today so far,.  -laxative stopped.  -Continue with IV fluids, antiemetic.  -Advance diet as tolerated. Advised patient to eat small meal.   -stool for O&P, stool culture pending.  - C. Diff by PCR negative.   Eating disorder/ Severe Malnutrition: check daily B-met and Mg and phosphate level. Continue with ensure.   Urine retention: Resolved.  Check UA, culture. Renal US normal.   Dehydration  - Continue with IV fluids.   Acute on chronic renal failure  - secondary to pre renal etiology. Patient with urinary retention,  renal US normal.  - Cr peak to 2.5, Cr decrease to 1.3.  -Urine retention resolved.   Leukocytosis, unspecified  -likely de margination. WBC normalized.   Hyponatremia  - secondary to pre renal etiology  -Resolved with IV fluids.   Borderline personality disorder  - appears to be stable at this time  -Continue with multiple medications. -Will avoid Ambien. Patient was very sedated with this medication.   Hypothyroidism: Continue with synthroid, Low TSH. Check free T 3 and T 4.   Code Status: Full.  Family Communication: Care discussed with patient.  Disposition Plan: remain inpatient. Home at time of discharge.    Consultants:  None  Procedures:  Renal US ordered.   Antibiotics:  none  HPI/Subjective: She has been able to urinate. She relates significant improvement of abdominal pain. No BM today. She would like to try regular food. At time she get abdominal pain when she eats and she wants to vomit.   Objective: Filed Vitals:   11/12/12 2106 11/13/12 0526 11/13/12 2200 11/14/12 0600  BP: 104/71 94/60 128/68 92/60  Pulse: 109  96 102 88  Temp: 98.4 F (36.9 C) 97.5 F (36.4 C) 98.3 F (36.8 C) 97.8 F (36.6 C)  TempSrc: Oral Axillary Oral Oral  Resp: 18 16 16 18   Height:      Weight:      SpO2: 98% 95% 98% 96%    Intake/Output Summary (Last 24 hours) at 11/14/12 1317 Last data filed at 11/14/12 0600  Gross per 24 hour  Intake   3000 ml  Output      2 ml  Net   2998 ml   Filed Weights   11/12/12 1836  Weight: 72.1 kg (158 lb 15.2 oz)    Exam:   General:  No distress.   Cardiovascular: S 1, S 2 RRR  Respiratory: CTA  Abdomen: Bs present, soft, nt, no rigidity, no guarding.   Musculoskeletal: no edema.   Data Reviewed: Basic Metabolic Panel:  Recent Labs Lab 11/12/12 1510 11/12/12 1907 11/13/12 0515 11/14/12 0520  NA 132* 134* 137 138  K 3.7 4.2 3.4* 3.5  CL 91* 100 105 109  CO2 24 19 25 23   GLUCOSE 114* 107* 83 76  BUN 40* 36* 30* 19  CREATININE 2.51* 2.06* 1.76* 1.38*  CALCIUM 9.7 7.9* 7.6* 7.7*  MG  --  2.9*  --   --   PHOS  --  3.9  --   --    Liver Function Tests:  Recent Labs Lab 11/12/12 1510 11/12/12 1907  AST 21 33  ALT 24 21  ALKPHOS 96 68  BILITOT 0.3 0.2*  PROT 7.6 5.8*  ALBUMIN 4.1 2.9*    Recent Labs Lab 11/12/12 1510  LIPASE 50   No results found for this basename: AMMONIA,  in the last 168 hours CBC:  Recent Labs Lab 11/12/12 1510 11/13/12 0515 11/14/12 0520  WBC 13.5* 6.9 5.3  NEUTROABS 8.9*  --   --   HGB 16.3* 12.0 11.1*  HCT 45.8 34.5* 32.8*  MCV 86.9 88.9 89.4  PLT 328 213 166   Cardiac Enzymes: No results found for this basename: CKTOTAL, CKMB, CKMBINDEX, TROPONINI,  in the last 168 hours BNP (last 3 results) No results found for this basename: PROBNP,  in the last 8760 hours CBG: No results found for this basename: GLUCAP,  in the last 168 hours  Recent Results (from the past 240 hour(s))  CLOSTRIDIUM DIFFICILE BY PCR     Status: None   Collection Time    11/13/12 10:46 AM      Result Value Range Status   C difficile  by pcr NEGATIVE  NEGATIVE Final     Studies: US Renal  11/13/2012   *RADIOLOGY REPORT*  Clinical Data: Chronic renal insufficiency.  RENAL/URINARY TRACT ULTRASOUND COMPLETE  Comparison:  None.  Findings:  Right Kidney:  Normal.  10.8 cm in length.  Left Kidney:  Normal.  10.8 cm in length.  Bladder:  Normal.  IMPRESSION: Normal bilateral renal ultrasound.   Original Report Authenticated By: Lorriane Shire, M.D.    Scheduled Meds: . enoxaparin (LOVENOX) injection  40 mg Subcutaneous Q24H  . feeding supplement  237 mL Oral TID WC  . Lactase  9,000 Units Oral TID WC  . lamoTRIgine  50 mg Oral q morning - 10a  . levothyroxine  175 mcg Oral QAC breakfast  . LORazepam  0.5 mg Oral BID  . magnesium oxide  200 mg Oral Daily  . methocarbamol  750 mg Oral TID  . mirtazapine  60 mg Oral QHS  . multivitamin with minerals  1 tablet Oral Daily  . omega-3 acid ethyl esters  2 g Oral BID  . pantoprazole  40 mg Oral Daily  . pregabalin  25 mg Oral QID  . QUEtiapine  200 mg Oral BID WC  . QUEtiapine  400 mg Oral QHS  . simvastatin  10 mg Oral q1800  . sodium chloride  3 mL Intravenous Q12H  . venlafaxine XR  37.5 mg Oral Q breakfast   Continuous Infusions: . sodium chloride 125 mL/hr at 11/12/12 2029    Principal Problem:   Abdominal pain, epigastric Active Problems:   Dehydration   Acute on chronic renal failure   Leukocytosis, unspecified   Hyponatremia   Borderline personality disorder   Protein-calorie malnutrition, severe    Time spent: 35 minutes.    Ireoluwa Grant  [facility] Pager [phone]. If 7PM-7AM, please contact night-coverage at [email or web address], password [facility] 11/14/2012, 1:17 PM  LOS: 2 days

## 2012-11-15 DIAGNOSIS — N289 Disorder of kidney and ureter, unspecified: Secondary | ICD-10-CM

## 2012-11-15 LAB — BASIC METABOLIC PANEL
CO2: 22 mEq/L (ref 19–32)
Calcium: 7.7 mg/dL — ABNORMAL LOW (ref 8.4–10.5)
Chloride: 109 mEq/L (ref 96–112)
Creatinine, Ser: 1.22 mg/dL — ABNORMAL HIGH (ref 0.50–1.10)
GFR calc Af Amer: 66 mL/min — ABNORMAL LOW (ref >90)

## 2012-11-15 LAB — URINALYSIS, ROUTINE W REFLEX MICROSCOPIC
Bilirubin Urine: NEGATIVE
Glucose, UA: NEGATIVE mg/dL
Nitrite: NEGATIVE
Specific Gravity, Urine: 1.024 (ref 1.005–1.030)
pH: 5.5 (ref 5.0–8.0)

## 2012-11-15 MED ORDER — DIPHENHYDRAMINE HCL 25 MG PO CAPS
25.0000 mg | ORAL_CAPSULE | Freq: Every evening | ORAL | Status: DC | PRN
  Administered 2012-11-15 – 2012-11-17 (×2): 25 mg via ORAL
  Filled 2012-11-15 (×2): qty 1

## 2012-11-15 MED ORDER — POTASSIUM CHLORIDE CRYS ER 20 MEQ PO TBCR
40.0000 meq | EXTENDED_RELEASE_TABLET | Freq: Once | ORAL | Status: AC
  Administered 2012-11-15: 40 meq via ORAL
  Filled 2012-11-15: qty 2

## 2012-11-15 MED ORDER — K PHOS MONO-SOD PHOS DI & MONO 155-852-130 MG PO TABS
500.0000 mg | ORAL_TABLET | Freq: Every day | ORAL | Status: DC
  Administered 2012-11-15: 500 mg via ORAL
  Filled 2012-11-15 (×2): qty 2

## 2012-11-15 NOTE — Progress Notes (Signed)
Brief Nutrition Note  Patient at risk for refeeding syndrome. Diet advanced to Brandi Love Dr Alejandro Otero Lopez. Receiving Ensure Complete BID. Unable to determine patient's intake due to inability to arouse from sleep. Lunch tray in room, patient has not eaten. Patient advised to eat small meals by MD.   Abdominal pain improved.   Potassium and phosphorus low, magnesium WNL.   INTERVENTION:  - Replete potassium and phosphorus levels.  - Recommend MD continue to monitor potassium, phosphorus, and magnesium as PO intake improves and replete as needed - Encourage intake of small meals.  Larey Seat, RD, LDN Pager #: [phone] After-Hours Pager #: [phone]

## 2012-11-15 NOTE — Progress Notes (Signed)
[facility] PROGRESS NOTE  Brandi Love [id number] DOB: Apr 27, 1975 DOA: 11/12/2012 PCP: Benito Mccreedy, MD  Assessment/Plan:  1-Abdominal pain, epigastric with nausea, vomiting, diarrhea  - Patient confirm use of laxatives. Continue with support care.  -Abdominal pain improved, diarrhea improved.  -laxative stopped.  -Continue with IV fluids, antiemetic.  -Advance diet as tolerated. Advised patient to eat small meal.   -stool for O&P, stool culture pending.  - C. Diff by PCR negative.   Hypokalemia, Hypophosphatemia: replete with K-phosp. Repeat labs in am.   Eating disorder/ Severe Malnutrition: check daily B-met and Mg and phosphate level. Continue with ensure. Patient induce vomit after eating yesterday. Needs very close follow up with therapist after discharge.   Urine retention: Resolved.   UA negative, culture. Renal US normal.   Dehydration  - Continue with IV fluids.   Acute on chronic renal failure  - secondary to pre renal etiology. Patient with urinary retention,  renal US normal.  - Cr peak to 2.5, Cr decrease to 1.2.  -Urine retention resolved.   Leukocytosis, unspecified  -likely de margination. WBC normalized.   Hyponatremia  - secondary to pre renal etiology  -Resolved with IV fluids.   Borderline personality disorder  - appears to be stable at this time  -Continue with multiple medications. -Will avoid Ambien. Patient was very sedated with this medication.   Hypothyroidism: Continue with synthroid, Low TSH.  free T 3 and T 4 normal.   Code Status: Full.  Family Communication: Care discussed with patient.  Disposition Plan: remain inpatient. Home at time of discharge.    Consultants:  None  Procedures:  Renal US ordered.   Antibiotics:  none  HPI/Subjective: Was not able to sleep last night. Abdominal pain better. She will try small meals.   Objective: Filed Vitals:   11/14/12 0600 11/14/12 1457 11/14/12 2200 11/15/12  0600  BP: 92/60 113/76 101/67 95/65  Pulse: 88 91 99 83  Temp: 97.8 F (36.6 C) 97.6 F (36.4 C) 98 F (36.7 C) 99 F (37.2 C)  TempSrc: Oral Oral Oral Oral  Resp: 18 18 20 16   Height:      Weight:      SpO2: 96% 98% 98% 96%    Intake/Output Summary (Last 24 hours) at 11/15/12 1352 Last data filed at 11/15/12 0619  Gross per 24 hour  Intake    903 ml  Output      2 ml  Net    901 ml   Filed Weights   11/12/12 1836  Weight: 72.1 kg (158 lb 15.2 oz)    Exam:   General:  No distress.   Cardiovascular: S 1, S 2 RRR  Respiratory: CTA  Abdomen: Bs present, soft, nt, no rigidity, no guarding.   Musculoskeletal: no edema.   Data Reviewed: Basic Metabolic Panel:  Recent Labs Lab 11/12/12 1510 11/12/12 1907 11/13/12 0515 11/14/12 0520 11/15/12 0550  NA 132* 134* 137 138 138  K 3.7 4.2 3.4* 3.5 3.2*  CL 91* 100 105 109 109  CO2 24 19 25 23 22   GLUCOSE 114* 107* 83 76 111*  BUN 40* 36* 30* 19 13  CREATININE 2.51* 2.06* 1.76* 1.38* 1.22*  CALCIUM 9.7 7.9* 7.6* 7.7* 7.7*  MG  --  2.9*  --  2.7* 2.4  PHOS  --  3.9  --  3.1 1.8*   Liver Function Tests:  Recent Labs Lab 11/12/12 1510 11/12/12 1907  AST 21 33  ALT 24 21  ALKPHOS  96 68  BILITOT 0.3 0.2*  PROT 7.6 5.8*  ALBUMIN 4.1 2.9*    Recent Labs Lab 11/12/12 1510  LIPASE 50   No results found for this basename: AMMONIA,  in the last 168 hours CBC:  Recent Labs Lab 11/12/12 1510 11/13/12 0515 11/14/12 0520  WBC 13.5* 6.9 5.3  NEUTROABS 8.9*  --   --   HGB 16.3* 12.0 11.1*  HCT 45.8 34.5* 32.8*  MCV 86.9 88.9 89.4  PLT 328 213 166   Cardiac Enzymes: No results found for this basename: CKTOTAL, CKMB, CKMBINDEX, TROPONINI,  in the last 168 hours BNP (last 3 results) No results found for this basename: PROBNP,  in the last 8760 hours CBG: No results found for this basename: GLUCAP,  in the last 168 hours  Recent Results (from the past 240 hour(s))  STOOL CULTURE     Status: None    Collection Time    11/13/12 10:46 AM      Result Value Range Status   Specimen Description STOOL   Final   Special Requests NONE   Final   Culture Culture reincubated for better growth   Final   Report Status PENDING   Incomplete  CLOSTRIDIUM DIFFICILE BY PCR     Status: None   Collection Time    11/13/12 10:46 AM      Result Value Range Status   C difficile by pcr NEGATIVE  NEGATIVE Final     Studies: US Renal  11/13/2012   *RADIOLOGY REPORT*  Clinical Data: Chronic renal insufficiency.  RENAL/URINARY TRACT ULTRASOUND COMPLETE  Comparison:  None.  Findings:  Right Kidney:  Normal.  10.8 cm in length.  Left Kidney:  Normal.  10.8 cm in length.  Bladder:  Normal.  IMPRESSION: Normal bilateral renal ultrasound.   Original Report Authenticated By: Lorriane Shire, M.D.    Scheduled Meds: . enoxaparin (LOVENOX) injection  40 mg Subcutaneous Q24H  . feeding supplement  237 mL Oral TID WC  . Lactase  9,000 Units Oral TID WC  . lamoTRIgine  50 mg Oral q morning - 10a  . levothyroxine  175 mcg Oral QAC breakfast  . LORazepam  0.5 mg Oral BID  . magnesium oxide  200 mg Oral Daily  . methocarbamol  750 mg Oral TID  . mirtazapine  60 mg Oral QHS  . multivitamin with minerals  1 tablet Oral Daily  . omega-3 acid ethyl esters  2 g Oral BID  . pantoprazole  40 mg Oral Daily  . phosphorus  500 mg Oral Daily  . pregabalin  25 mg Oral QID  . QUEtiapine  200 mg Oral BID WC  . QUEtiapine  400 mg Oral QHS  . sodium chloride  3 mL Intravenous Q12H  . venlafaxine XR  37.5 mg Oral Q breakfast   Continuous Infusions: . sodium chloride 75 mL/hr at 11/15/12 [id number]    Principal Problem:   Abdominal pain, epigastric Active Problems:   Dehydration   Acute on chronic renal failure   Leukocytosis, unspecified   Hyponatremia   Borderline personality disorder   Protein-calorie malnutrition, severe    Time spent: 35 minutes.    Brandi Love  [facility] Pager [phone]. If 7PM-7AM,  please contact night-coverage at [email or web address], password [facility] 11/15/2012, 1:52 PM  LOS: 3 days

## 2012-11-16 ENCOUNTER — Inpatient Hospital Stay (HOSPITAL_COMMUNITY): Payer: Medicare Other

## 2012-11-16 LAB — BASIC METABOLIC PANEL
BUN: 12 mg/dL (ref 6–23)
Chloride: 109 mEq/L (ref 96–112)
Creatinine, Ser: 1.25 mg/dL — ABNORMAL HIGH (ref 0.50–1.10)
GFR calc Af Amer: 64 mL/min — ABNORMAL LOW (ref >90)
GFR calc non Af Amer: 55 mL/min — ABNORMAL LOW (ref >90)
Potassium: 4.3 mEq/L (ref 3.5–5.1)

## 2012-11-16 LAB — STOOL CULTURE

## 2012-11-16 LAB — OVA AND PARASITE EXAMINATION

## 2012-11-16 LAB — MAGNESIUM: Magnesium: 2.1 mg/dL (ref 1.5–2.5)

## 2012-11-16 LAB — PHOSPHORUS: Phosphorus: 1.9 mg/dL — ABNORMAL LOW (ref 2.3–4.6)

## 2012-11-16 MED ORDER — POTASSIUM & SODIUM PHOSPHATES 280-160-250 MG PO PACK
1.0000 | PACK | Freq: Three times a day (TID) | ORAL | Status: DC
  Administered 2012-11-16 – 2012-11-17 (×5): 1 via ORAL
  Filled 2012-11-16 (×8): qty 1

## 2012-11-16 NOTE — Evaluation (Signed)
Clinical/Bedside Swallow Evaluation Patient Details  Name: Brandi Love MRN: [id number] Date of Birth: 08/17/1975  Today's Date: 11/16/2012 Time: [id number] SLP Time Calculation (min): 32 min  Past Medical History:  Past Medical History  Diagnosis Date  . Eating disorder   . Anxiety   . Depression   . OCD (obsessive compulsive disorder)   . Pulmonary embolism   . Seizures   . Anemia   . Tachycardia   . Hypertension   . Chronic renal insufficiency   . 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 Endometriosis   . GERD (gastroesophageal reflux disease)   . Migraine   . Deafness    Past Surgical History:  Past Surgical History  Procedure Laterality Date  . Cochlear implant    . Abdominal hysterectomy      'except for right ovary'  . Eye surgery Left 1980  . Eye surgery Right 1990  . Breast lumpectomy Left 2000    x2  . Breast lumpectomy Right 2002    benign  . Laparoscopic abdominal exploration  2001 and 2002    for endometriosis   HPI:  37 yo female with significant psychiatric illnesses including depression, personality disorder, eating disorder, recent admissions to [facility] for suicidal attempt in April and July 2014, presented to [facility] ED with complaint of progressively worsening generalized abdominal pain, intermittent in nature and non radiating.  Pain associated with nausea, non bloody vomiting, non bloody and watery diarrhea.  SLP received order for swallow evaluation.  Pt denies neurological history and intake has been good in the last few days.      Assessment / Plan / Recommendation Clinical Impression  Functional oropharyngeal swallow ability present from clinical swallow assessment completed.  Pt without CN deficits that would impact her swallowing.    SLP observed pt consuming Sprite and graham cracker, she did report stasis of cracker that cleared with liquid swallow but otherwise not symptoms of difficulties.  Pt states she does not drink during her  meals, advised her to attempt to drink liquids during meal to see if mitigates her symptoms.     Report of occasional problems swallowing saliva stated by pt.  SLP questions if pt's h/o GERD and eating disorder/vomiting may have impacted cricopharyngeus function and esophageal motility.  Pharyngeal sensation of stasis is likely referrant from esophagus due to vagal nerve involvement.    If symptoms persist, pt may benefit from dedicated esophageal evaluation.  SLP to sign off, as all education is complete.  Thanks for this referral.      Aspiration Risk  None    Diet Recommendation Regular;Thin liquid   Liquid Administration via: Cup;Straw Medication Administration:  (as tolerated) Supervision: Patient able to self feed Compensations: Slow rate;Small sips/bites (drink liquids throughout meal) Postural Changes and/or Swallow Maneuvers: Seated upright 90 degrees;Upright 30-60 min after meal    Other  Recommendations Oral Care Recommendations: Oral care BID   Follow Up Recommendations  None    Frequency and Duration        Pertinent Vitals/Pain Afebrile, clear    SLP Swallow Goals  n/a   Swallow Study Prior Functional Status   h/o GERD, eating disorder, ho aspiration pna x1 several years ago per pt    General Date of Onset: 11/16/12 HPI: 37 yo female with significant psychiatric illnesses including depression, personality disorder, eating disorder, recent admissions to [facility] for suicidal attempt in April and July 2014, presented to [facility] ED with complaint of progressively  worsening generalized abdominal pain, intermittent in nature and non radiating.  Pain associated with nausea, non bloody vomiting, non bloody and watery diarrhea.  SLP received order for swallow evaluation.  Pt denies neurological history and intake has been good in the last few days.    Type of Study: Bedside swallow evaluation Diet Prior to this Study: Regular;Thin liquids Respiratory Status: Room air History of  Recent Intubation: No Behavior/Cognition: Alert;Cooperative;Pleasant mood;Hard of hearing (has cochlear implant) Oral Cavity - Dentition: Adequate natural dentition Self-Feeding Abilities: Able to feed self Patient Positioning: Upright in bed Baseline Vocal Quality: Clear Volitional Cough: Strong Volitional Swallow: Able to elicit    Oral/Motor/Sensory Function Overall Oral Motor/Sensory Function: Appears within functional limits for tasks assessed (pt reports her "tongue was tied" as a baby, no focal CN defi)   Ice Chips Ice chips: Not tested   Thin Liquid Thin Liquid: Within functional limits Presentation: Self Fed;Straw;Cup    Nectar Thick Nectar Thick Liquid: Not tested   Honey Thick Honey Thick Liquid: Not tested   Puree Puree: Not tested   Solid   GO    Solid: Within functional limits Other Comments: pt with sensation of stasis in pharynx, cleared with liquid swallow       Luanna Salk, [facility] SLP [phone]

## 2012-11-16 NOTE — Progress Notes (Signed)
Clinical Social Work Department BRIEF PSYCHOSOCIAL ASSESSMENT 11/16/2012  Patient:  Brandi Love, Brandi Love     Account Number:  [id number]     Admit date:  11/12/2012  Clinical Social Worker:  Earlie Server  Date/Time:  11/16/2012 10:30 AM  Referred by:  Physician  Date Referred:  11/16/2012 Referred for  Psychosocial assessment   Other Referral:   Interview type:  Patient Other interview type:    PSYCHOSOCIAL DATA Living Status:  FAMILY Admitted from facility:   Level of care:   Primary support name:  Neoma Laming Primary support relationship to patient:  PARENT Degree of support available:   Strong    CURRENT CONCERNS Current Concerns  Other - See comment   Other Concerns:   MD wanted to ensure that patient has psych follow up at [place].    SOCIAL WORK ASSESSMENT / PLAN CSW received referral from MD to complete psychosocial assessment. CSW reviewed chart and met with patient at bedside.    Patient reports that she recently moved back to [place] a few months ago after living in [place]. Patient reports that she wanted to move to [facility] to go back to school at [employer] and to have better mental health care. Patient reports that she is currently on disability but interested in going to school for audiology. 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 Patient reports that she has been working with ACT team through [facility]. Patient also has an eating disorder and is excited to report that she is going to start getting treatment this week. Patient reports good relationship with ACT team and will follow up at [place] spoke with [facility] who confirms patient is active with ACT team and they will continue to follow at [place].   Assessment/plan status:  Psychosocial Support/Ongoing Assessment of Needs Other assessment/ plan:   Information/referral to community resources:   Patient will continue to use services via [facility] ACT team     PATIENT'S/FAMILY'S RESPONSE TO PLAN OF CARE: Patient alert and oriented. Patient engaged throughout assessment and open talking with CSW. Patient reports that she has several informal and formal services at this time and politely declines any further follow up. Patient agreeable for CSW to continue to support during hospital stay.

## 2012-11-16 NOTE — Progress Notes (Signed)
[facility] PROGRESS NOTE  Brandi Love [id number] DOB: 06-22-1975 DOA: 11/12/2012 PCP: Benito Mccreedy, MD  Assessment/Plan:  1-Abdominal pain, epigastric with nausea, vomiting, diarrhea  - Patient confirm use of laxatives. Continue with support care.  -Abdominal pain improved, diarrhea improved.  -laxative stopped.  -Continue with IV fluids, antiemetic.  -Advance diet as tolerated. Advised patient to eat small meal.   -stool for O&P, stool culture pending.  - C. Diff by PCR negative.   Hypokalemia, Hypophosphatemia: replete with Na-phosp. Repeat labs in am.   Eating disorder/ Severe Malnutrition: check daily B-met and Mg and phosphate level. Continue with ensure.  Needs very close follow up with therapist after discharge.   Urine retention: Resolved.   UA negative, culture. Renal US normal.   Dehydration  - Continue with IV fluids.   Acute on chronic renal failure  - secondary to pre renal etiology. Patient with urinary retention,  renal US normal.  - Cr peak to 2.5, Cr decrease to 1.2.  -Urine retention resolved.   Leukocytosis, unspecified  -likely de margination. WBC normalized.   Hyponatremia  - secondary to pre renal etiology  -Resolved with IV fluids.   Borderline personality disorder  - appears to be stable at this time  -Continue with multiple medications. -Will avoid Ambien. Patient was very sedated with this medication.   Hypothyroidism: Continue with synthroid, Low TSH.  free T 3 and T 4 normal.   Code Status: Full.  Family Communication: Care discussed with patient.  Disposition Plan: remain inpatient. Home tomorrow if electrolytes stable.    Consultants:  None  Procedures:  Renal US ordered.   Antibiotics:  none  HPI/Subjective: Was able to sleep last night. Abdominal pain better. She is trying  small meals. No vomiting yesterday.   Objective: Filed Vitals:   11/15/12 0600 11/15/12 1442 11/15/12 2200 11/16/12 0600  BP:  95/65 109/74 113/77 100/68  Pulse: 83 99 99 79  Temp: 99 F (37.2 C) 98.8 F (37.1 C) 98.6 F (37 C) 98.6 F (37 C)  TempSrc: Oral Oral Oral Oral  Resp: 16 16 20 20   Height:      Weight:      SpO2: 96% 97% 98% 96%    Intake/Output Summary (Last 24 hours) at 11/16/12 1155 Last data filed at 11/16/12 0800  Gross per 24 hour  Intake 2136.25 ml  Output      0 ml  Net 2136.25 ml   Filed Weights   11/12/12 1836  Weight: 72.1 kg (158 lb 15.2 oz)    Exam:   General:  No distress.   Cardiovascular: S 1, S 2 RRR  Respiratory: CTA  Abdomen: Bs present, soft, nt, no rigidity, no guarding.   Musculoskeletal: no edema.   Data Reviewed: Basic Metabolic Panel:  Recent Labs Lab 11/12/12 1510 11/12/12 1907 11/13/12 0515 11/14/12 0520 11/15/12 0550 11/16/12 0440  NA 132* 134* 137 138 138 138  K 3.7 4.2 3.4* 3.5 3.2* 4.3  CL 91* 100 105 109 109 109  CO2 24 19 25 23 22 20   GLUCOSE 114* 107* 83 76 111* 134*  BUN 40* 36* 30* 19 13 12   CREATININE 2.51* 2.06* 1.76* 1.38* 1.22* 1.25*  CALCIUM 9.7 7.9* 7.6* 7.7* 7.7* 7.7*  MG  --  2.9*  --  2.7* 2.4 2.1  PHOS  --  3.9  --  3.1 1.8* 1.9*   Liver Function Tests:  Recent Labs Lab 11/12/12 1510 11/12/12 1907  AST 21 33  ALT  24 21  ALKPHOS 96 68  BILITOT 0.3 0.2*  PROT 7.6 5.8*  ALBUMIN 4.1 2.9*    Recent Labs Lab 11/12/12 1510  LIPASE 50   No results found for this basename: AMMONIA,  in the last 168 hours CBC:  Recent Labs Lab 11/12/12 1510 11/13/12 0515 11/14/12 0520  WBC 13.5* 6.9 5.3  NEUTROABS 8.9*  --   --   HGB 16.3* 12.0 11.1*  HCT 45.8 34.5* 32.8*  MCV 86.9 88.9 89.4  PLT 328 213 166   Cardiac Enzymes: No results found for this basename: CKTOTAL, CKMB, CKMBINDEX, TROPONINI,  in the last 168 hours BNP (last 3 results) No results found for this basename: PROBNP,  in the last 8760 hours CBG: No results found for this basename: GLUCAP,  in the last 168 hours  Recent Results (from the past  240 hour(s))  STOOL CULTURE     Status: None   Collection Time    11/13/12 10:46 AM      Result Value Range Status   Specimen Description STOOL   Final   Special Requests NONE   Final   Culture NO SUSPICIOUS COLONIES, CONTINUING TO HOLD   Final   Report Status PENDING   Incomplete  CLOSTRIDIUM DIFFICILE BY PCR     Status: None   Collection Time    11/13/12 10:46 AM      Result Value Range Status   C difficile by pcr NEGATIVE  NEGATIVE Final     Studies: Dg Chest 2 View  11/16/2012   *RADIOLOGY REPORT*  Clinical Data: Cough  CHEST - 2 VIEW  Comparison: Aug 26, 2012  Findings: There is no focal infiltrate, pulmonary edema, or pleural effusion.  The mediastinal contour and cardiac silhouette are normal.  The soft tissues and osseous structures are stable  IMPRESSION: No acute cardiopulmonary disease identified.   Original Report Authenticated By: Abelardo Diesel, M.D.    Scheduled Meds: . enoxaparin (LOVENOX) injection  40 mg Subcutaneous Q24H  . feeding supplement  237 mL Oral TID WC  . Lactase  9,000 Units Oral TID WC  . lamoTRIgine  50 mg Oral q morning - 10a  . levothyroxine  175 mcg Oral QAC breakfast  . LORazepam  0.5 mg Oral BID  . magnesium oxide  200 mg Oral Daily  . methocarbamol  750 mg Oral TID  . mirtazapine  60 mg Oral QHS  . multivitamin with minerals  1 tablet Oral Daily  . omega-3 acid ethyl esters  2 g Oral BID  . pantoprazole  40 mg Oral Daily  . potassium & sodium phosphates  1 packet Oral TID WC & HS  . pregabalin  25 mg Oral QID  . QUEtiapine  200 mg Oral BID WC  . QUEtiapine  400 mg Oral QHS  . sodium chloride  3 mL Intravenous Q12H  . venlafaxine XR  37.5 mg Oral Q breakfast   Continuous Infusions: . sodium chloride 75 mL/hr at 11/16/12 [id number]    Principal Problem:   Abdominal pain, epigastric Active Problems:   Dehydration   Acute on chronic renal failure   Leukocytosis, unspecified   Hyponatremia   Borderline personality disorder   Protein-calorie  malnutrition, severe    Time spent: 35 minutes.    Tara Rud  [facility] Pager [phone]. If 7PM-7AM, please contact night-coverage at [email or web address], password [facility] 11/16/2012, 11:55 AM  LOS: 4 days

## 2012-11-16 NOTE — Evaluation (Signed)
Physical Therapy Evaluation-1x eval Patient Details Name: Brandi Love MRN: [id number] DOB: [birth year] Today's Date: 11/16/2012 Time: [id number] PT Time Calculation (min): 18 min  PT Assessment / Plan / Recommendation History of Present Illness  37 yo female with significant psychiatric illnesses including depression, personality disorder, eating disorder, recent admissions to [facility] for suicidal attempt in April and July 2014, now presents to [facility] ED with main concern of progressively worsening generalized abdominal pain, intermittent in nature and non radiating, 10/10 in severity when present and associated with nausea, non bloody vomiting, non bloody and watery diarrhea  Clinical Impression  On eval, pt was Mod I-Ind with all mobility-able to ambulate ~250 feet without assistive device. No further PT needs at this time. 1x eval.     PT Assessment  Patent does not need any further PT services    Follow Up Recommendations  No PT follow up    Does the patient have the potential to tolerate intense rehabilitation      Barriers to Discharge        Equipment Recommendations  None recommended by PT    Recommendations for Other Services     Frequency      Precautions / Restrictions Precautions Precautions: None Restrictions Weight Bearing Restrictions: No   Pertinent Vitals/Pain No c/o pain      Mobility  Bed Mobility Bed Mobility: Supine to Sit;Sit to Supine Supine to Sit: 7: Independent Sit to Supine: 7: Independent Transfers Transfers: Sit to Stand;Stand to Sit Sit to Stand: 7: Independent Stand to Sit: 7: Independent Ambulation/Gait Ambulation/Gait Assistance: 7: Independent Ambulation Distance (Feet): 250 Feet Assistive device: None Stairs: Yes Stairs Assistance: 6: Modified independent (Device/Increase time) Stair Management Technique: One rail Left Number of Stairs: 5    Exercises     PT Diagnosis:    PT Problem List:   PT Treatment Interventions:        PT Goals(Current goals can be found in the care plan section) Acute Rehab PT Goals PT Goal Formulation: No goals set, d/c therapy  Visit Information  Last PT Received On: 11/16/12 Assistance Needed: +1 History of Present Illness: 37 yo female with significant psychiatric illnesses including depression, personality disorder, eating disorder, recent admissions to [facility] for suicidal attempt in April and July 2014, now presents to [facility] ED with main concern of progressively worsening generalized abdominal pain, intermittent in nature and non radiating, 10/10 in severity when present and associated with nausea, non bloody vomiting, non bloody and watery diarrhea       Prior Functioning  Home Living Family/patient expects to be discharged to:: Private residence Living Arrangements: Alone Type of Home: House Home Access: Level entry Home Layout: Two level Alternate Level Stairs-Number of Steps: 1 flight Alternate Level Stairs-Rails: Right Home Equipment: None Prior Function Level of Independence: Independent Communication Communication: HOH    Cognition  Cognition Arousal/Alertness: Awake/alert Behavior During Therapy: WFL for tasks assessed/performed Overall Cognitive Status: Within Functional Limits for tasks assessed    Extremity/Trunk Assessment Upper Extremity Assessment Upper Extremity Assessment: Overall WFL for tasks assessed Lower Extremity Assessment Lower Extremity Assessment: Overall WFL for tasks assessed Cervical / Trunk Assessment Cervical / Trunk Assessment: Normal   Balance    End of Session PT - End of Session Activity Tolerance: Patient tolerated treatment well Patient left: in bed;with call bell/phone within reach  GP     Weston Anna, MPT Pager: [phone]

## 2012-11-16 NOTE — Progress Notes (Signed)
Nutrition Brief Note  Met with pt who reports no purging for the past 2 days. Was able to eat 100% of lunch today which consisted of a reuben sandwich, french fries, salad, and a sprite. Pt does not c/o of any nausea. Provided emotional support and encouragement for pt being able to keep down food without vomiting. Pt planning on following up with outpatient eating disorder specialist. Phosphorus low today, getting packets of sodium phosphates. Potassium and magnesium WNL.    Potassium  Date/Time Value Range Status  11/16/2012  4:40 AM 4.3  3.5 - 5.1 mEq/L Final     SLIGHT HEMOLYSIS     DELTA CHECK NOTED  11/15/2012  5:50 AM 3.2* 3.5 - 5.1 mEq/L Final  11/14/2012  5:20 AM 3.5  3.5 - 5.1 mEq/L Final    Phosphorus  Date/Time Value Range Status  11/16/2012  4:40 AM 1.9* 2.3 - 4.6 mg/dL Final  11/15/2012  5:50 AM 1.8* 2.3 - 4.6 mg/dL Final  11/14/2012  5:20 AM 3.1  2.3 - 4.6 mg/dL Final    Magnesium  Date/Time Value Range Status  11/16/2012  4:40 AM 2.1  1.5 - 2.5 mg/dL Final  11/15/2012  5:50 AM 2.4  1.5 - 2.5 mg/dL Final  11/14/2012  5:20 AM 2.7* 1.5 - 2.5 mg/dL Final     Mikey College MS, RD, [place] Pager [phone] After Hours Pager

## 2012-11-17 ENCOUNTER — Inpatient Hospital Stay (HOSPITAL_COMMUNITY): Payer: Medicare Other

## 2012-11-17 LAB — URINE CULTURE: Colony Count: 50000

## 2012-11-17 LAB — BASIC METABOLIC PANEL
Calcium: 8 mg/dL — ABNORMAL LOW (ref 8.4–10.5)
Creatinine, Ser: 1.36 mg/dL — ABNORMAL HIGH (ref 0.50–1.10)
GFR calc Af Amer: 58 mL/min — ABNORMAL LOW (ref >90)
Sodium: 142 mEq/L (ref 135–145)

## 2012-11-17 LAB — MAGNESIUM: Magnesium: 2 mg/dL (ref 1.5–2.5)

## 2012-11-17 LAB — PHOSPHORUS: Phosphorus: 2 mg/dL — ABNORMAL LOW (ref 2.3–4.6)

## 2012-11-17 MED ORDER — DEXTROSE 5 % IV SOLN
10.0000 mmol | Freq: Once | INTRAVENOUS | Status: AC
  Administered 2012-11-17: 10 mmol via INTRAVENOUS
  Filled 2012-11-17: qty 3.33

## 2012-11-17 NOTE — Progress Notes (Signed)
Clinical Social Work  CSW attempted to meet with patient at bedside but patient currently sleeping. CSW will continue to follow to provide support during hospitalization.  [place], [place] [phone]

## 2012-11-17 NOTE — Progress Notes (Signed)
[facility] PROGRESS NOTE  Brandi Love [id number] DOB: October 02, 1975 DOA: 11/12/2012 PCP: Benito Mccreedy, MD  Assessment/Plan:  37 yo female with significant psychiatric illnesses including depression, personality disorder, eating disorder, recent admissions to [facility] for suicidal attempt in April and July 2014,  Patient admitted with nausea, vomiting, diarrhea abdominal pain. She has history of eating disorder. She confirm that she was taking multiple laxative and inducing vomit. Over course of hospitalization she improved, diarrhea almost resolved. She started to eat more. On 7-29 she wake up with nausea, she vomit once and develop recurrence of diarrhea. Her abdominal pain has persist. For this reason an CT abdomen was ordered.   1-Abdominal pain, epigastric with nausea, vomiting, diarrhea  - Patient confirm use of laxatives. Continue with support care. -laxative stopped.  -Continue with IV fluids, antiemetic.  -Advance diet as tolerated. Advised patient to eat small meal.   -stool for O&P, stool culture negative.   - C. Diff by PCR negative.  Check CT abdomen.   Hypokalemia, Hypophosphatemia: replete phosphorus IV per pharmacy protocol. Labs daily.   Eating disorder/ Severe Malnutrition: check daily B-met and Mg and phosphate daily.  Continue with ensure.  Needs very close follow up with therapist after discharge. At risk for refeeding syndrome, check daily electrolytes.   Urine retention: Resolved.   UA negative, culture. Renal US normal.   Dehydration  - Continue with IV fluids.   Acute on chronic renal failure  - secondary to pre renal etiology. Patient with urinary retention,  renal US normal.  - Cr peak to 2.5, Cr decrease to 1.3.  -Urine retention resolved.   Leukocytosis, unspecified  -likely de margination. WBC normalized.   Hyponatremia  - secondary to pre renal etiology  -Resolved with IV fluids.   Borderline personality disorder  - appears to be  stable at this time  -Continue with multiple medications. -Will avoid Ambien. Patient was very sedated with this medication.   Hypothyroidism: Continue with synthroid, Low TSH.  free T 3 and T 4 normal.   Code Status: Full.  Family Communication: Care discussed with patient.  Disposition Plan: remain inpatient.  Consultants:  None  Procedures:  Renal US ordered.   Antibiotics:  none  HPI/Subjective: Abdominal pain persist. Wake up with nausea and vomit this morning. Had diarrhea today/,  Objective: Filed Vitals:   11/16/12 2116 11/17/12 0534 11/17/12 0828 11/17/12 1420  BP: 114/77 104/70  110/77  Pulse: 85 89  94  Temp: 98.2 F (36.8 C) 98.4 F (36.9 C) 98.1 F (36.7 C) 98.6 F (37 C)  TempSrc: Oral Axillary Oral Oral  Resp: 20 20  18   Height:      Weight:      SpO2: 99% 96%  95%    Intake/Output Summary (Last 24 hours) at 11/17/12 1742 Last data filed at 11/17/12 1300  Gross per 24 hour  Intake    360 ml  Output      0 ml  Net    360 ml   Filed Weights   11/12/12 1836  Weight: 72.1 kg (158 lb 15.2 oz)    Exam:   General:  No distress.   Cardiovascular: S 1, S 2 RRR  Respiratory: CTA  Abdomen: Bs present, soft, nt, no rigidity, no guarding.   Musculoskeletal: no edema.   Data Reviewed: Basic Metabolic Panel:  Recent Labs Lab 11/12/12 1907 11/13/12 0515 11/14/12 0520 11/15/12 0550 11/16/12 0440 11/17/12 0455  NA 134* 137 138 138 138 142  K  4.2 3.4* 3.5 3.2* 4.3 4.2  CL 100 105 109 109 109 113*  CO2 19 25 23 22 20 21   GLUCOSE 107* 83 76 111* 134* 88  BUN 36* 30* 19 13 12 14   CREATININE 2.06* 1.76* 1.38* 1.22* 1.25* 1.36*  CALCIUM 7.9* 7.6* 7.7* 7.7* 7.7* 8.0*  MG 2.9*  --  2.7* 2.4 2.1 2.0  PHOS 3.9  --  3.1 1.8* 1.9* 2.0*   Liver Function Tests:  Recent Labs Lab 11/12/12 1510 11/12/12 1907  AST 21 33  ALT 24 21  ALKPHOS 96 68  BILITOT 0.3 0.2*  PROT 7.6 5.8*  ALBUMIN 4.1 2.9*    Recent Labs Lab 11/12/12 1510   LIPASE 50   No results found for this basename: AMMONIA,  in the last 168 hours CBC:  Recent Labs Lab 11/12/12 1510 11/13/12 0515 11/14/12 0520  WBC 13.5* 6.9 5.3  NEUTROABS 8.9*  --   --   HGB 16.3* 12.0 11.1*  HCT 45.8 34.5* 32.8*  MCV 86.9 88.9 89.4  PLT 328 213 166   Cardiac Enzymes: No results found for this basename: CKTOTAL, CKMB, CKMBINDEX, TROPONINI,  in the last 168 hours BNP (last 3 results) No results found for this basename: PROBNP,  in the last 8760 hours CBG: No results found for this basename: GLUCAP,  in the last 168 hours  Recent Results (from the past 240 hour(s))  STOOL CULTURE     Status: None   Collection Time    11/13/12 10:46 AM      Result Value Range Status   Specimen Description STOOL   Final   Special Requests NONE   Final   Culture     Final   Value: NO SALMONELLA, SHIGELLA, CAMPYLOBACTER, YERSINIA, OR E.COLI 0157:H7 ISOLATED   Report Status 11/16/2012 FINAL   Final  OVA AND PARASITE EXAMINATION     Status: None   Collection Time    11/13/12 10:46 AM      Result Value Range Status   Specimen Description STOOL   Final   Special Requests NONE   Final   Ova and parasites NO OVA OR PARASITES SEEN   Final   Report Status 11/16/2012 FINAL   Final  CLOSTRIDIUM DIFFICILE BY PCR     Status: None   Collection Time    11/13/12 10:46 AM      Result Value Range Status   C difficile by pcr NEGATIVE  NEGATIVE Final  URINE CULTURE     Status: None   Collection Time    11/15/12  3:34 AM      Result Value Range Status   Specimen Description URINE, CLEAN CATCH   Final   Special Requests NONE   Final   Culture  Setup Time 11/15/2012 19:22   Final   Colony Count 50,000 COLONIES/ML   Final   Culture KLEBSIELLA PNEUMONIAE   Final   Report Status 11/17/2012 FINAL   Final   Organism ID, Bacteria KLEBSIELLA PNEUMONIAE   Final     Studies: Ct Abdomen Pelvis Wo Contrast  11/17/2012   *RADIOLOGY REPORT*  Clinical Data: Abdominal pain  CT ABDOMEN AND  PELVIS WITHOUT CONTRAST  Technique:  Multidetector CT imaging of the abdomen and pelvis was performed following the standard protocol without intravenous contrast.  Comparison: 08/26/2012, 11/13/2012  Findings: Minimal atelectasis is noted in the bases bilaterally. No focal confluent infiltrate is seen.  The liver and spleen are within normal limits.  The gallbladder is contracted  without evidence of stones.  The adrenal glands and pancreas are unremarkable as well.  The kidneys are well visualized bilaterally without evidence of renal calculi or urinary tract obstructive changes.  Scanning into the pelvis demonstrates a bladder to be well distended without opacified urine.  No pelvic mass lesion or side wall abnormality is noted.  The uterus has been surgically removed. A few scattered lymph nodes are noted within the mesentery although none of these are significant by size criteria.  The osseous structures are grossly unremarkable.  IMPRESSION: No acute abnormality is noted.  No significant change from prior study.   Original Report Authenticated By: Inez Catalina, M.D.   Dg Chest 2 View  11/16/2012   *RADIOLOGY REPORT*  Clinical Data: Cough  CHEST - 2 VIEW  Comparison: Aug 26, 2012  Findings: There is no focal infiltrate, pulmonary edema, or pleural effusion.  The mediastinal contour and cardiac silhouette are normal.  The soft tissues and osseous structures are stable  IMPRESSION: No acute cardiopulmonary disease identified.   Original Report Authenticated By: Abelardo Diesel, M.D.    Scheduled Meds: . enoxaparin (LOVENOX) injection  40 mg Subcutaneous Q24H  . feeding supplement  237 mL Oral TID WC  . Lactase  9,000 Units Oral TID WC  . lamoTRIgine  50 mg Oral q morning - 10a  . levothyroxine  175 mcg Oral QAC breakfast  . LORazepam  0.5 mg Oral BID  . magnesium oxide  200 mg Oral Daily  . methocarbamol  750 mg Oral TID  . mirtazapine  60 mg Oral QHS  . multivitamin with minerals  1 tablet Oral Daily   . omega-3 acid ethyl esters  2 g Oral BID  . pantoprazole  40 mg Oral Daily  . pregabalin  25 mg Oral QID  . QUEtiapine  200 mg Oral BID WC  . QUEtiapine  400 mg Oral QHS  . sodium chloride  3 mL Intravenous Q12H  . sodium phosphate  Dextrose 5% IVPB  10 mmol Intravenous Once  . venlafaxine XR  37.5 mg Oral Q breakfast   Continuous Infusions: . sodium chloride 75 mL/hr at 11/16/12 [id number]    Principal Problem:   Abdominal pain, epigastric Active Problems:   Dehydration   Acute on chronic renal failure   Leukocytosis, unspecified   Hyponatremia   Borderline personality disorder   Protein-calorie malnutrition, severe    Time spent: 35 minutes.    Jarod Bozzo  [facility] Pager [phone]. If 7PM-7AM, please contact night-coverage at [email or web address], password [facility] 11/17/2012, 5:42 PM  LOS: 5 days

## 2012-11-18 DIAGNOSIS — N183 Chronic kidney disease, stage 3 unspecified: Secondary | ICD-10-CM

## 2012-11-18 LAB — MAGNESIUM: Magnesium: 1.8 mg/dL (ref 1.5–2.5)

## 2012-11-18 LAB — CBC
Hemoglobin: 10.6 g/dL — ABNORMAL LOW (ref 12.0–15.0)
MCV: 89.5 fL (ref 78.0–100.0)
Platelets: 186 10*3/uL (ref 150–400)
RBC: 3.53 MIL/uL — ABNORMAL LOW (ref 3.87–5.11)
WBC: 6.4 10*3/uL (ref 4.0–10.5)

## 2012-11-18 LAB — BASIC METABOLIC PANEL
CO2: 21 mEq/L (ref 19–32)
Chloride: 110 mEq/L (ref 96–112)
Creatinine, Ser: 1.4 mg/dL — ABNORMAL HIGH (ref 0.50–1.10)
Glucose, Bld: 105 mg/dL — ABNORMAL HIGH (ref 70–99)
Sodium: 138 mEq/L (ref 135–145)

## 2012-11-18 LAB — PHOSPHORUS: Phosphorus: 3 mg/dL (ref 2.3–4.6)

## 2012-11-18 NOTE — Discharge Summary (Signed)
Physician Discharge Summary  Brandi Love [id number] DOB: 1975-05-10 DOA: 11/12/2012  PCP: Benito Mccreedy, MD  Admit date: 11/12/2012 Discharge date: 11/18/2012  Time spent: 40 minutes  Recommendations for Outpatient Follow-up:  -Advised to follow up with her PCP in 2 weeks. -Will also follow up with the eating disorder center as scheduled.  Discharge Diagnoses:  Principal Problem:   Abdominal pain, epigastric Active Problems:   Dehydration   Acute on chronic renal failure   Leukocytosis, unspecified   Hyponatremia   Borderline personality disorder   Protein-calorie malnutrition, severe   CKD (chronic kidney disease) stage 3, GFR 30-59 ml/min   Discharge Condition: Stable and improved  Filed Weights   11/12/12 1836  Weight: 72.1 kg (158 lb 15.2 oz)    History of present illness:  Patient is a 37 yo female with significant psychiatric illnesses including depression, personality disorder, eating disorder, recent admissions to [facility] for suicidal attempt in April and July 2014, now presents to [facility] ED with main concern of progressively worsening generalized abdominal pain, intermittent in nature and non radiating, 10/10 in severity when present and associated with nausea, non bloody vomiting, non bloody and watery diarrhea. Pt reports similar events in the past but is unsure what has caused it. She denies recent sick contacts or exposures, no recent changes in medications, no recent antibiotics use. She also reports associated dizziness worse with standing and feeling as she will pass out. She denies fevers, chills, no specific urinary concerns, no specific focal neurological symptoms.  In ED, pt with persistent nausea and vomiting, Hg 16 and indicative of dehydration, mild leukocytosis and WBC of 13 K, acute renal failure with Cr 2.51. Review of records indicate baseline creatinine 1.2 - 1.6 in April - July 2014. [facility] asked to admit for further evaluation.    Hospital Course:    Epigastric Abdominal Pain/N/V -No etiology identified. -CT scan of the abdomen WNL. -C DIff PCR was negative, stool cx negative. -Has resolved and is tolerating a regular diet.  Acute on CKD Stage III -Cr peaked at 2.5 and is back to her baseline of 1.3-1.5 with IVF. -Suspect a component of prerenal azotemia given her ongoing GI losses.  Psychiatric Illnesses -Including eating/personality disorders, depression. -Continue psych meds. -Will follow up with psych on DC.  Procedures:  None   Consultations:  None  Discharge Instructions  Discharge Orders   Future Orders Complete By Expires     Discontinue IV  As directed     Increase activity slowly  As directed         Medication List         acetaminophen 500 MG tablet  Commonly known as:  TYLENOL  Take 500 mg by mouth every 6 (six) hours as needed for pain (headache).     BENADRYL 25 mg capsule  Generic drug:  diphenhydrAMINE  Take 25 mg by mouth every 6 (six) hours as needed (to alleviate cramps and twitching when taking haldol).     desvenlafaxine 50 MG 24 hr tablet  Commonly known as:  PRISTIQ  Take 50 mg by mouth daily.     docusate sodium 100 MG capsule  Commonly known as:  COLACE  Take 200 mg by mouth 2 (two) times daily.     haloperidol 5 MG tablet  Commonly known as:  HALDOL  Take 5 mg by mouth every 12 (twelve) hours as needed (for anxiety or going through crisis events).     lactase 3000 UNITS tablet  Commonly  known as:  LACTAID  Take 1 tablet by mouth 3 (three) times daily with meals.     lamoTRIgine 25 MG tablet  Commonly known as:  LAMICTAL  Take 50 mg by mouth every morning.     levothyroxine 175 MCG tablet  Commonly known as:  SYNTHROID, LEVOTHROID  Take 175 mcg by mouth daily.     LORazepam 0.5 MG tablet  Commonly known as:  ATIVAN  Take 0.5 mg by mouth 2 (two) times daily. 12 p and 8 p     magnesium oxide 400 MG tablet  Commonly known as:  MAG-OX  Take 200 mg by mouth daily.      Melatonin 3 MG Tabs  Take 1 tablet by mouth at bedtime.     methocarbamol 750 MG tablet  Commonly known as:  ROBAXIN  Take 750 mg by mouth 3 (three) times daily.     mirtazapine 30 MG tablet  Commonly known as:  REMERON  Take 60 mg by mouth at bedtime.     multivitamin with minerals Tabs  Take 1 tablet by mouth daily.     omega-3 acid ethyl esters 1 G capsule  Commonly known as:  LOVAZA  Take 2 g by mouth 2 (two) times daily.     omeprazole 20 MG capsule  Commonly known as:  PRILOSEC  Take 20 mg by mouth daily.     ondansetron 4 MG tablet  Commonly known as:  ZOFRAN  Take 4 mg by mouth every 8 (eight) hours as needed for nausea.     polyethylene glycol packet  Commonly known as:  MIRALAX / GLYCOLAX  Take 17 g by mouth daily.     pravastatin 20 MG tablet  Commonly known as:  PRAVACHOL  Take 20 mg by mouth every evening.     pregabalin 25 MG capsule  Commonly known as:  LYRICA  Take 25 mg by mouth 4 (four) times daily.     QUEtiapine 100 MG tablet  Commonly known as:  SEROQUEL  Take 200 mg by mouth 2 (two) times daily. Takes at 8am, 12pm     QUEtiapine 400 MG tablet  Commonly known as:  SEROQUEL  Take 400 mg by mouth at bedtime.     SUMAtriptan 100 MG tablet  Commonly known as:  IMITREX  Take 50-100 mg by mouth every 2 (two) hours as needed for migraine (initial dose is 100mg , make take 50mg  every 2 hours if needed to relieve migraine.  No more than 200 mg total in 24 hours).     Vitamin D (Ergocalciferol) 50000 UNITS Caps  Commonly known as:  DRISDOL  Take 50,000 Units by mouth every 7 (seven) days. On Sunday       Allergies  Allergen Reactions  . Iodinated Diagnostic Agents Anaphylaxis    'cant breathe'  . Abilify (Aripiprazole) Nausea And Vomiting  . Dihydroergotamine Nausea And Vomiting  . Doxycycline Nausea And Vomiting  . Entex Lq (Phenylephrine-Guaifenesin) Other (See Comments)    unknown  . Erythromycin Nausea And Vomiting  . Lactose  Intolerance (Gi) Nausea And Vomiting  . Nsaids     Unable to ttake due to renal insufficency  . Tape Other (See Comments)    Redness, can use paper tape       Follow-up Information   Follow up with OSEI-BONSU,GEORGE, MD. Schedule an appointment as soon as possible for a visit in 2 weeks.   Contact information:   [place], [id number] [place]  [place] [address] [phone]        The results of significant diagnostics from this hospitalization (including imaging, microbiology, ancillary and laboratory) are listed below for reference.    Significant Diagnostic Studies: Ct Abdomen Pelvis Wo Contrast  11/17/2012   *RADIOLOGY REPORT*  Clinical Data: Abdominal pain  CT ABDOMEN AND PELVIS WITHOUT CONTRAST  Technique:  Multidetector CT imaging of the abdomen and pelvis was performed following the standard protocol without intravenous contrast.  Comparison: 08/26/2012, 11/13/2012  Findings: Minimal atelectasis is noted in the bases bilaterally. No focal confluent infiltrate is seen.  The liver and spleen are within normal limits.  The gallbladder is contracted without evidence of stones.  The adrenal glands and pancreas are unremarkable as well.  The kidneys are well visualized bilaterally without evidence of renal calculi or urinary tract obstructive changes.  Scanning into the pelvis demonstrates a bladder to be well distended without opacified urine.  No pelvic mass lesion or side wall abnormality is noted.  The uterus has been surgically removed. A few scattered lymph nodes are noted within the mesentery although none of these are significant by size criteria.  The osseous structures are grossly unremarkable.  IMPRESSION: No acute abnormality is noted.  No significant change from prior study.   Original Report Authenticated By: Inez Catalina, M.D.   Dg Chest 2 View  11/16/2012   *RADIOLOGY REPORT*  Clinical Data: Cough  CHEST - 2 VIEW  Comparison: Aug 26, 2012  Findings: There is no focal  infiltrate, pulmonary edema, or pleural effusion.  The mediastinal contour and cardiac silhouette are normal.  The soft tissues and osseous structures are stable  IMPRESSION: No acute cardiopulmonary disease identified.   Original Report Authenticated By: Abelardo Diesel, M.D.   US Renal  11/13/2012   *RADIOLOGY REPORT*  Clinical Data: Chronic renal insufficiency.  RENAL/URINARY TRACT ULTRASOUND COMPLETE  Comparison:  None.  Findings:  Right Kidney:  Normal.  10.8 cm in length.  Left Kidney:  Normal.  10.8 cm in length.  Bladder:  Normal.  IMPRESSION: Normal bilateral renal ultrasound.   Original Report Authenticated By: Lorriane Shire, M.D.    Microbiology: Recent Results (from the past 240 hour(s))  STOOL CULTURE     Status: None   Collection Time    11/13/12 10:46 AM      Result Value Range Status   Specimen Description STOOL   Final   Special Requests NONE   Final   Culture     Final   Value: NO SALMONELLA, SHIGELLA, CAMPYLOBACTER, YERSINIA, OR E.COLI 0157:H7 ISOLATED   Report Status 11/16/2012 FINAL   Final  OVA AND PARASITE EXAMINATION     Status: None   Collection Time    11/13/12 10:46 AM      Result Value Range Status   Specimen Description STOOL   Final   Special Requests NONE   Final   Ova and parasites NO OVA OR PARASITES SEEN   Final   Report Status 11/16/2012 FINAL   Final  CLOSTRIDIUM DIFFICILE BY PCR     Status: None   Collection Time    11/13/12 10:46 AM      Result Value Range Status   C difficile by pcr NEGATIVE  NEGATIVE Final  URINE CULTURE     Status: None   Collection Time    11/15/12  3:34 AM      Result Value Range Status   Specimen Description URINE, CLEAN CATCH   Final   Special Requests NONE  Final   Culture  Setup Time 11/15/2012 19:22   Final   Colony Count 50,000 COLONIES/ML   Final   Culture KLEBSIELLA PNEUMONIAE   Final   Report Status 11/17/2012 FINAL   Final   Organism ID, Bacteria KLEBSIELLA PNEUMONIAE   Final     Labs: Basic Metabolic  Panel:  Recent Labs Lab 11/14/12 0520 11/15/12 0550 11/16/12 0440 11/17/12 0455 11/18/12 0513  NA 138 138 138 142 138  K 3.5 3.2* 4.3 4.2 4.2  CL 109 109 109 113* 110  CO2 23 22 20 21 21   GLUCOSE 76 111* 134* 88 105*  BUN 19 13 12 14 13   CREATININE 1.38* 1.22* 1.25* 1.36* 1.40*  CALCIUM 7.7* 7.7* 7.7* 8.0* 8.2*  MG 2.7* 2.4 2.1 2.0 1.8  PHOS 3.1 1.8* 1.9* 2.0* 3.0   Liver Function Tests:  Recent Labs Lab 11/12/12 1510 11/12/12 1907  AST 21 33  ALT 24 21  ALKPHOS 96 68  BILITOT 0.3 0.2*  PROT 7.6 5.8*  ALBUMIN 4.1 2.9*    Recent Labs Lab 11/12/12 1510  LIPASE 50   No results found for this basename: AMMONIA,  in the last 168 hours CBC:  Recent Labs Lab 11/12/12 1510 11/13/12 0515 11/14/12 0520 11/18/12 0513  WBC 13.5* 6.9 5.3 6.4  NEUTROABS 8.9*  --   --   --   HGB 16.3* 12.0 11.1* 10.6*  HCT 45.8 34.5* 32.8* 31.6*  MCV 86.9 88.9 89.4 89.5  PLT 328 213 166 186   Cardiac Enzymes: No results found for this basename: CKTOTAL, CKMB, CKMBINDEX, TROPONINI,  in the last 168 hours BNP: BNP (last 3 results) No results found for this basename: PROBNP,  in the last 8760 hours CBG: No results found for this basename: GLUCAP,  in the last 168 hours     Signed:  Lelon Frohlich  [facility] Pager: [phone] 11/18/2012, 7:13 PM

## 2012-11-18 NOTE — Progress Notes (Signed)
NUTRITION FOLLOW UP  Intervention:   - Refeeding labs WNL, pt eating well, no vomiting  - Pt eager to start treatment with outpatient eating disorder therapist - Nutrition signing off  Nutrition Dx:   Predicted suboptimal energy intake related to eating disorder as evidenced by pt statement - resolved  Goal:   1. No further nausea/vomiting - met 2. Pt to consume >90% of meals/supplements - met   Assessment:   Met with pt who states she had an episode of vomiting yesterday morning and diarrhea all day. Denies that the vomiting was intentional, states she just woke up not feeling well and vomited. RN reports emesis was clear in color with tiny particles of food. Pt without vomiting today. Pt reports she has been eating all of her meals and drinking all of her Ensure Complete. Pt smiling, in good spirits, eager to go home, appreciative of RD help. Potassium, phosphorus, and magnesium WNL today. No diarrhea reported today.   Height: Ht Readings from Last 1 Encounters:  11/12/12 5\' 8"  (1.727 m)    Weight Status:   Wt Readings from Last 1 Encounters:  11/12/12 158 lb 15.2 oz (72.1 kg)    Re-estimated needs:  Kcal: 1800-2000  Protein: 72-86g  Fluid: 1.8-2L/day   Skin: Intact  Diet Order: General   Intake/Output Summary (Last 24 hours) at 11/18/12 1144 Last data filed at 11/18/12 0600  Gross per 24 hour  Intake 3733.33 ml  Output      0 ml  Net 3733.33 ml    Last BM:  7/29   Labs:   Recent Labs Lab 11/16/12 0440 11/17/12 0455 11/18/12 0513  NA 138 142 138  K 4.3 4.2 4.2  CL 109 113* 110  CO2 20 21 21   BUN 12 14 13   CREATININE 1.25* 1.36* 1.40*  CALCIUM 7.7* 8.0* 8.2*  MG 2.1 2.0 1.8  PHOS 1.9* 2.0* 3.0  GLUCOSE 134* 88 105*    CBG (last 3)  No results found for this basename: GLUCAP,  in the last 72 hours  Scheduled Meds: . enoxaparin (LOVENOX) injection  40 mg Subcutaneous Q24H  . feeding supplement  237 mL Oral TID WC  . Lactase  9,000 Units Oral  TID WC  . lamoTRIgine  50 mg Oral q morning - 10a  . levothyroxine  175 mcg Oral QAC breakfast  . LORazepam  0.5 mg Oral BID  . magnesium oxide  200 mg Oral Daily  . methocarbamol  750 mg Oral TID  . mirtazapine  60 mg Oral QHS  . multivitamin with minerals  1 tablet Oral Daily  . omega-3 acid ethyl esters  2 g Oral BID  . pantoprazole  40 mg Oral Daily  . pregabalin  25 mg Oral QID  . QUEtiapine  200 mg Oral BID WC  . QUEtiapine  400 mg Oral QHS  . sodium chloride  3 mL Intravenous Q12H  . venlafaxine XR  37.5 mg Oral Q breakfast    Continuous Infusions: . sodium chloride 125 mL/hr at 11/18/12 [place], RD, [place] Pager [phone] After Hours Pager

## 2012-11-18 NOTE — Progress Notes (Signed)
Patient discharged home in stable condition.  No change from morning assessment.  Discharge instructions given to pt with verbal understanding.

## 2012-11-18 NOTE — Progress Notes (Signed)
Pharmacy Consult for Phosphorus replacement:  Asked by Dr. Tyrell Antonio on 7/29 to follow up phosphorus this am = 3.0 which is WNL following NaPhos 4mmol IV given 7/29.  A/P: Would not recommend any further IV replacement at this time. Suspect level should remain wnl as long as PO intake improves.  Peggyann Juba, PharmD, BCPS Pager: [phone] 11/18/2012 8:50 AM

## 2012-11-23 ENCOUNTER — Encounter (HOSPITAL_COMMUNITY): Payer: Self-pay | Admitting: Emergency Medicine

## 2012-11-23 ENCOUNTER — Inpatient Hospital Stay (HOSPITAL_COMMUNITY)
Admission: EM | Admit: 2012-11-23 | Discharge: 2012-11-26 | DRG: 682 | Disposition: A | Discharge status: Home / Self Care | Payer: Medicare Other | Attending: Internal Medicine | Admitting: Internal Medicine

## 2012-11-23 DIAGNOSIS — F502 Bulimia nervosa, unspecified: Secondary | ICD-10-CM

## 2012-11-23 DIAGNOSIS — F509 Eating disorder, unspecified: Secondary | ICD-10-CM | POA: Diagnosis present

## 2012-11-23 DIAGNOSIS — E039 Hypothyroidism, unspecified: Secondary | ICD-10-CM

## 2012-11-23 DIAGNOSIS — IMO0002 Reserved for concepts with insufficient information to code with codable children: Secondary | ICD-10-CM

## 2012-11-23 DIAGNOSIS — E1169 Type 2 diabetes mellitus with other specified complication: Secondary | ICD-10-CM | POA: Diagnosis present

## 2012-11-23 DIAGNOSIS — Z79899 Other long term (current) drug therapy: Secondary | ICD-10-CM

## 2012-11-23 DIAGNOSIS — F5089 Other specified eating disorder: Secondary | ICD-10-CM

## 2012-11-23 DIAGNOSIS — F339 Major depressive disorder, recurrent, unspecified: Secondary | ICD-10-CM | POA: Diagnosis present

## 2012-11-23 DIAGNOSIS — N189 Chronic kidney disease, unspecified: Secondary | ICD-10-CM | POA: Diagnosis present

## 2012-11-23 DIAGNOSIS — F429 Obsessive-compulsive disorder, unspecified: Secondary | ICD-10-CM | POA: Diagnosis present

## 2012-11-23 DIAGNOSIS — H9190 Unspecified hearing loss, unspecified ear: Secondary | ICD-10-CM | POA: Diagnosis present

## 2012-11-23 DIAGNOSIS — R1013 Epigastric pain: Secondary | ICD-10-CM

## 2012-11-23 DIAGNOSIS — E860 Dehydration: Secondary | ICD-10-CM

## 2012-11-23 DIAGNOSIS — R000 Tachycardia, unspecified: Secondary | ICD-10-CM

## 2012-11-23 DIAGNOSIS — N179 Acute kidney failure, unspecified: Principal | ICD-10-CM

## 2012-11-23 DIAGNOSIS — I498 Other specified cardiac arrhythmias: Secondary | ICD-10-CM | POA: Diagnosis present

## 2012-11-23 DIAGNOSIS — E785 Hyperlipidemia, unspecified: Secondary | ICD-10-CM

## 2012-11-23 DIAGNOSIS — E871 Hypo-osmolality and hyponatremia: Secondary | ICD-10-CM | POA: Diagnosis present

## 2012-11-23 DIAGNOSIS — F1910 Other psychoactive substance abuse, uncomplicated: Secondary | ICD-10-CM | POA: Diagnosis present

## 2012-11-23 DIAGNOSIS — K219 Gastro-esophageal reflux disease without esophagitis: Secondary | ICD-10-CM | POA: Diagnosis present

## 2012-11-23 DIAGNOSIS — Z86711 Personal history of pulmonary embolism: Secondary | ICD-10-CM

## 2012-11-23 DIAGNOSIS — F603 Borderline personality disorder: Secondary | ICD-10-CM

## 2012-11-23 DIAGNOSIS — E43 Unspecified severe protein-calorie malnutrition: Secondary | ICD-10-CM | POA: Diagnosis present

## 2012-11-23 DIAGNOSIS — I129 Hypertensive chronic kidney disease with stage 1 through stage 4 chronic kidney disease, or unspecified chronic kidney disease: Secondary | ICD-10-CM | POA: Diagnosis present

## 2012-11-23 LAB — CBC WITH DIFFERENTIAL/PLATELET
Hemoglobin: 15.6 g/dL — ABNORMAL HIGH (ref 12.0–15.0)
Lymphs Abs: 2.3 10*3/uL (ref 0.7–4.0)
Monocytes Relative: 9 % (ref 3–12)
Neutro Abs: 9 10*3/uL — ABNORMAL HIGH (ref 1.7–7.7)
Neutrophils Relative %: 71 % (ref 43–77)
RBC: 5.06 MIL/uL (ref 3.87–5.11)

## 2012-11-23 LAB — COMPREHENSIVE METABOLIC PANEL
Albumin: 4.3 g/dL (ref 3.5–5.2)
Alkaline Phosphatase: 84 U/L (ref 39–117)
BUN: 31 mg/dL — ABNORMAL HIGH (ref 6–23)
CO2: 19 mEq/L (ref 19–32)
Chloride: 95 mEq/L — ABNORMAL LOW (ref 96–112)
GFR calc Af Amer: 31 mL/min — ABNORMAL LOW (ref >90)
Glucose, Bld: 123 mg/dL — ABNORMAL HIGH (ref 70–99)
Potassium: 4.7 mEq/L (ref 3.5–5.1)
Total Bilirubin: 0.3 mg/dL (ref 0.3–1.2)

## 2012-11-23 LAB — TROPONIN I: Troponin I: <0.3 ng/mL (ref <0.30)

## 2012-11-23 MED ORDER — QUETIAPINE FUMARATE 400 MG PO TABS
400.0000 mg | ORAL_TABLET | Freq: Every day | ORAL | Status: DC
  Administered 2012-11-23 – 2012-11-25 (×3): 400 mg via ORAL
  Filled 2012-11-23 (×4): qty 1

## 2012-11-23 MED ORDER — SIMVASTATIN 10 MG PO TABS
10.0000 mg | ORAL_TABLET | Freq: Every day | ORAL | Status: DC
  Administered 2012-11-23 – 2012-11-25 (×3): 10 mg via ORAL
  Filled 2012-11-23 (×4): qty 1

## 2012-11-23 MED ORDER — PANTOPRAZOLE SODIUM 40 MG PO TBEC
40.0000 mg | DELAYED_RELEASE_TABLET | Freq: Every day | ORAL | Status: DC
  Administered 2012-11-24 – 2012-11-26 (×3): 40 mg via ORAL
  Filled 2012-11-23 (×3): qty 1

## 2012-11-23 MED ORDER — LORAZEPAM 2 MG/ML IJ SOLN
1.0000 mg | Freq: Once | INTRAMUSCULAR | Status: DC
  Filled 2012-11-23: qty 1

## 2012-11-23 MED ORDER — SODIUM CHLORIDE 0.9 % IV BOLUS (SEPSIS)
1000.0000 mL | Freq: Once | INTRAVENOUS | Status: AC
  Administered 2012-11-23: 1000 mL via INTRAVENOUS

## 2012-11-23 MED ORDER — SODIUM CHLORIDE 0.9 % IJ SOLN
3.0000 mL | Freq: Two times a day (BID) | INTRAMUSCULAR | Status: DC
  Administered 2012-11-24: 3 mL via INTRAVENOUS

## 2012-11-23 MED ORDER — DIPHENHYDRAMINE HCL 25 MG PO CAPS: 25.0000 mg | ORAL_CAPSULE | Freq: Four times a day (QID) | ORAL | Status: DC | PRN

## 2012-11-23 MED ORDER — HALOPERIDOL 5 MG PO TABS: 5.0000 mg | ORAL_TABLET | Freq: Two times a day (BID) | ORAL | Status: DC | PRN

## 2012-11-23 MED ORDER — MIRTAZAPINE 30 MG PO TABS
60.0000 mg | ORAL_TABLET | Freq: Every day | ORAL | Status: DC
  Administered 2012-11-23 – 2012-11-25 (×3): 60 mg via ORAL
  Filled 2012-11-23 (×4): qty 2

## 2012-11-23 MED ORDER — QUETIAPINE FUMARATE 200 MG PO TABS
200.0000 mg | ORAL_TABLET | ORAL | Status: DC
  Administered 2012-11-24 – 2012-11-26 (×6): 200 mg via ORAL
  Filled 2012-11-23 (×8): qty 1

## 2012-11-23 MED ORDER — VITAMIN D (ERGOCALCIFEROL) 1.25 MG (50000 UNIT) PO CAPS: 50000.0000 [IU] | ORAL_CAPSULE | ORAL | Status: DC

## 2012-11-23 MED ORDER — VITAMIN D (ERGOCALCIFEROL) 1.25 MG (50000 UNIT) PO CAPS
50000.0000 [IU] | ORAL_CAPSULE | ORAL | Status: DC
  Administered 2012-11-23: 50000 [IU] via ORAL
  Filled 2012-11-23: qty 1

## 2012-11-23 MED ORDER — ADULT MULTIVITAMIN W/MINERALS CH
1.0000 | ORAL_TABLET | Freq: Every day | ORAL | Status: DC
  Administered 2012-11-24 – 2012-11-26 (×3): 1 via ORAL
  Filled 2012-11-23 (×3): qty 1

## 2012-11-23 MED ORDER — SODIUM CHLORIDE 0.9 % IV SOLN
INTRAVENOUS | Status: DC
  Administered 2012-11-24 – 2012-11-25 (×4): via INTRAVENOUS
  Administered 2012-11-26: 75 mL/h via INTRAVENOUS

## 2012-11-23 MED ORDER — ONDANSETRON HCL 4 MG PO TABS
4.0000 mg | ORAL_TABLET | Freq: Three times a day (TID) | ORAL | Status: DC | PRN
  Administered 2012-11-24 – 2012-11-25 (×2): 4 mg via ORAL
  Filled 2012-11-23 (×2): qty 1

## 2012-11-23 MED ORDER — SODIUM CHLORIDE 0.9 % IV SOLN
INTRAVENOUS | Status: DC
  Administered 2012-11-23: 16:00:00 via INTRAVENOUS

## 2012-11-23 MED ORDER — ENOXAPARIN SODIUM 40 MG/0.4ML ~~LOC~~ SOLN
40.0000 mg | SUBCUTANEOUS | Status: DC
  Administered 2012-11-23 – 2012-11-25 (×3): 40 mg via SUBCUTANEOUS
  Filled 2012-11-23 (×4): qty 0.4

## 2012-11-23 MED ORDER — LAMOTRIGINE 25 MG PO TABS
50.0000 mg | ORAL_TABLET | Freq: Every morning | ORAL | Status: DC
  Administered 2012-11-24 – 2012-11-26 (×3): 50 mg via ORAL
  Filled 2012-11-23 (×3): qty 2

## 2012-11-23 MED ORDER — LEVOTHYROXINE SODIUM 175 MCG PO TABS
175.0000 ug | ORAL_TABLET | Freq: Every day | ORAL | Status: DC
  Administered 2012-11-24 – 2012-11-26 (×3): 175 ug via ORAL
  Filled 2012-11-23 (×3): qty 1

## 2012-11-23 MED ORDER — LORAZEPAM 0.5 MG PO TABS
0.5000 mg | ORAL_TABLET | ORAL | Status: DC
  Administered 2012-11-24 – 2012-11-26 (×5): 0.5 mg via ORAL
  Filled 2012-11-23 (×5): qty 1

## 2012-11-23 MED ORDER — ZOLPIDEM TARTRATE 5 MG PO TABS
5.0000 mg | ORAL_TABLET | Freq: Once | ORAL | Status: AC
  Administered 2012-11-23: 5 mg via ORAL
  Filled 2012-11-23: qty 1

## 2012-11-23 MED ORDER — NON FORMULARY: 3.0000 mg | Freq: Every day | Status: DC

## 2012-11-23 MED ORDER — ACETAMINOPHEN 500 MG PO TABS: 500.0000 mg | ORAL_TABLET | Freq: Four times a day (QID) | ORAL | Status: DC | PRN

## 2012-11-23 MED ORDER — PREGABALIN 25 MG PO CAPS
25.0000 mg | ORAL_CAPSULE | Freq: Four times a day (QID) | ORAL | Status: DC
  Administered 2012-11-24 – 2012-11-26 (×9): 25 mg via ORAL
  Filled 2012-11-23 (×9): qty 1

## 2012-11-23 MED ORDER — OMEGA-3-ACID ETHYL ESTERS 1 G PO CAPS
2.0000 g | ORAL_CAPSULE | Freq: Two times a day (BID) | ORAL | Status: DC
  Administered 2012-11-24 – 2012-11-26 (×5): 2 g via ORAL
  Filled 2012-11-23 (×6): qty 2

## 2012-11-23 MED ORDER — DESVENLAFAXINE SUCCINATE ER 50 MG PO TB24
50.0000 mg | ORAL_TABLET | Freq: Every day | ORAL | Status: DC
  Administered 2012-11-24 – 2012-11-26 (×3): 50 mg via ORAL
  Filled 2012-11-23 (×3): qty 1

## 2012-11-23 MED ORDER — QUETIAPINE FUMARATE 200 MG PO TABS
200.0000 mg | ORAL_TABLET | Freq: Two times a day (BID) | ORAL | Status: DC
  Filled 2012-11-23: qty 1

## 2012-11-23 NOTE — ED Provider Notes (Signed)
CSN: [id number]     Arrival date & time 11/23/12  1307 History     First MD Initiated Contact with Patient 11/23/12 1325     Chief Complaint  Patient presents with  . Irregular Heart Beat   (Consider location/radiation/quality/duration/timing/severity/associated sxs/prior Treatment) The history is provided by the patient.  pt with hx bulimia presents stating in past day feels generally weak.  Stood up last pm and felt very lightheaded. Went to pcp today, [facility], and was referred to ED.  Pt indicates was hospitalized approximately 1-2 weeks ago w same. States when left hospital was concerned as weight increased, became upset, so started to intermittently self induce emesis again, and used laxatives. Pt notes similar symptoms in past.  Denies overdose or overuse of her other medication. No abd pain. No fever or chills. No dysuria or gu c/o.     Past Medical History  Diagnosis Date  . Eating disorder   . Anxiety   . Depression   . OCD (obsessive compulsive disorder)   . Pulmonary embolism   . Seizures   . Anemia   . Tachycardia   . Hypertension   . Chronic renal insufficiency   . 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 Endometriosis   . GERD (gastroesophageal reflux disease)   . Migraine   . Deafness    Past Surgical History  Procedure Laterality Date  . Cochlear implant    . Abdominal hysterectomy      'except for right ovary'  . Eye surgery Left 1980  . Eye surgery Right 1990  . Breast lumpectomy Left 2000    x2  . Breast lumpectomy Right 2002    benign  . Laparoscopic abdominal exploration  2001 and 2002    for endometriosis   Family History  Problem Relation Age of Onset  . Cancer Father    History  Substance Use Topics  . Smoking status: Never Smoker   . Smokeless tobacco: Never Used  . Alcohol Use: No   OB History   Grav Para Term Preterm Abortions TAB SAB Ect Mult Living                 Review of Systems  Constitutional: Negative for  fever and chills.  HENT: Negative for neck pain.   Eyes: Negative for redness.  Respiratory: Negative for shortness of breath.   Cardiovascular: Negative for chest pain.  Gastrointestinal: Positive for vomiting. Negative for abdominal pain.  Genitourinary: Negative for dysuria and flank pain.  Musculoskeletal: Negative for back pain.  Skin: Negative for rash.  Neurological: Negative for weakness, numbness and headaches.  Hematological: Does not bruise/bleed easily.  Psychiatric/Behavioral: Negative for confusion.    Allergies  Iodinated diagnostic agents; Abilify; Dihydroergotamine; Doxycycline; Entex lq; Erythromycin; Lactose intolerance (gi); Nsaids; and Tape  Home Medications   Current Outpatient Rx  Name  Route  Sig  Dispense  Refill  . acetaminophen (TYLENOL) 500 MG tablet   Oral   Take 500 mg by mouth every 6 (six) hours as needed for pain (headache).         . bisacodyl (DULCOLAX) 5 MG EC tablet   Oral   Take 30-35 mg by mouth daily. 7 tablets in the am and 6 tablets at noon and 7 tablets at bedtime         . desvenlafaxine (PRISTIQ) 50 MG 24 hr tablet   Oral   Take 50 mg by mouth daily.         Marland Kitchen  diphenhydrAMINE (BENADRYL) 25 mg capsule   Oral   Take 25 mg by mouth every 6 (six) hours as needed (to alleviate cramps and twitching when taking haldol).         Marland Kitchen docusate sodium (COLACE) 100 MG capsule   Oral   Take 200 mg by mouth 2 (two) times daily.         . haloperidol (HALDOL) 5 MG tablet   Oral   Take 5 mg by mouth every 12 (twelve) hours as needed (for anxiety or going through crisis events).         Marland Kitchen levothyroxine (SYNTHROID, LEVOTHROID) 175 MCG tablet   Oral   Take 175 mcg by mouth daily.         Marland Kitchen LORazepam (ATIVAN) 0.5 MG tablet   Oral   Take 0.5 mg by mouth 2 (two) times daily. 12 p and 8 p         . magnesium oxide (MAG-OX) 400 MG tablet   Oral   Take 200 mg by mouth daily.         . Melatonin 3 MG TABS   Oral   Take 3 mg  by mouth at bedtime.          . methocarbamol (ROBAXIN) 750 MG tablet   Oral   Take 750 mg by mouth 3 (three) times daily.         . mirtazapine (REMERON) 30 MG tablet   Oral   Take 60 mg by mouth at bedtime.          . Multiple Vitamin (MULTIVITAMIN WITH MINERALS) TABS   Oral   Take 1 tablet by mouth daily.         Marland Kitchen omega-3 acid ethyl esters (LOVAZA) 1 G capsule   Oral   Take 2 g by mouth 2 (two) times daily.         Marland Kitchen omeprazole (PRILOSEC) 20 MG capsule   Oral   Take 20 mg by mouth daily.         . ondansetron (ZOFRAN) 4 MG tablet   Oral   Take 4 mg by mouth every 8 (eight) hours as needed for nausea.         Marland Kitchen OVER THE COUNTER MEDICATION   Oral   Take 2 capsules by mouth 2 (two) times daily. hydroxycut supplement         . pravastatin (PRAVACHOL) 20 MG tablet   Oral   Take 20 mg by mouth every evening.         . pregabalin (LYRICA) 25 MG capsule   Oral   Take 25 mg by mouth 4 (four) times daily.          . QUEtiapine (SEROQUEL) 100 MG tablet   Oral   Take 200 mg by mouth 2 (two) times daily. Takes at 8am, 12pm         . QUEtiapine (SEROQUEL) 400 MG tablet   Oral   Take 400 mg by mouth at bedtime.         . Vitamin D, Ergocalciferol, (DRISDOL) 50000 UNITS CAPS   Oral   Take 50,000 Units by mouth every 7 (seven) days. On Sunday         . lamoTRIgine (LAMICTAL) 25 MG tablet   Oral   Take 50 mg by mouth every morning.          BP 105/72  Pulse 118  Temp(Src) 98.9 F (37.2 C) (Oral)  Resp 18  SpO2 97% Physical Exam  Nursing note and vitals reviewed. Constitutional: She is oriented to person, place, and time. She appears well-developed and well-nourished. No distress.  HENT:  Mouth/Throat: Oropharynx is clear and moist.  Eyes: Conjunctivae are normal. Pupils are equal, round, and reactive to light. No scleral icterus.  Neck: Neck supple. No tracheal deviation present.  Cardiovascular: Regular rhythm, normal heart sounds and  intact distal pulses.  Exam reveals no gallop and no friction rub.   No murmur heard. Pulmonary/Chest: Effort normal and breath sounds normal. No respiratory distress.  Abdominal: Soft. Normal appearance and bowel sounds are normal. She exhibits no distension and no mass. There is no tenderness. There is no rebound and no guarding.  Genitourinary:  No cva tenderness  Musculoskeletal: She exhibits no edema and no tenderness.  Neurological: She is alert and oriented to person, place, and time.  Skin: Skin is warm and dry. No rash noted. She is not diaphoretic.  Multiple healed, linear, parallel scars forearms c/w prior self cutting behavior.   Psychiatric:  Flat affect.     ED Course   Procedures (including critical care time)  Results for orders placed during the hospital encounter of 11/23/12  CBC WITH DIFFERENTIAL      Result Value Range   WBC 12.6 (*) 4.0 - 10.5 K/uL   RBC 5.06  3.87 - 5.11 MIL/uL   Hemoglobin 15.6 (*) 12.0 - 15.0 g/dL   HCT 45.0  36.0 - 46.0 %   MCV 88.9  78.0 - 100.0 fL   MCH 30.8  26.0 - 34.0 pg   MCHC 34.7  30.0 - 36.0 g/dL   RDW 13.1  11.5 - 15.5 %   Platelets 364  150 - 400 K/uL   Neutrophils Relative % 71  43 - 77 %   Neutro Abs 9.0 (*) 1.7 - 7.7 K/uL   Lymphocytes Relative 18  12 - 46 %   Lymphs Abs 2.3  0.7 - 4.0 K/uL   Monocytes Relative 9  3 - 12 %   Monocytes Absolute 1.2 (*) 0.1 - 1.0 K/uL   Eosinophils Relative 1  0 - 5 %   Eosinophils Absolute 0.1  0.0 - 0.7 K/uL   Basophils Relative 0  0 - 1 %   Basophils Absolute 0.1  0.0 - 0.1 K/uL  COMPREHENSIVE METABOLIC PANEL      Result Value Range   Sodium 133 (*) 135 - 145 mEq/L   Potassium 4.7  3.5 - 5.1 mEq/L   Chloride 95 (*) 96 - 112 mEq/L   CO2 19  19 - 32 mEq/L   Glucose, Bld 123 (*) 70 - 99 mg/dL   BUN 31 (*) 6 - 23 mg/dL   Creatinine, Ser 2.28 (*) 0.50 - 1.10 mg/dL   Calcium 10.5  8.4 - 10.5 mg/dL   Total Protein 7.8  6.0 - 8.3 g/dL   Albumin 4.3  3.5 - 5.2 g/dL   AST 20  0 - 37  U/L   ALT 33  0 - 35 U/L   Alkaline Phosphatase 84  39 - 117 U/L   Total Bilirubin 0.3  0.3 - 1.2 mg/dL   GFR calc non Af Amer 27 (*) >90 mL/min   GFR calc Af Amer 31 (*) >90 mL/min  TROPONIN I      Result Value Range   Troponin I <0.30  <0.30 ng/mL   Ct Abdomen Pelvis Wo Contrast  11/17/2012   *RADIOLOGY REPORT*  Clinical Data:  Abdominal pain  CT ABDOMEN AND PELVIS WITHOUT CONTRAST  Technique:  Multidetector CT imaging of the abdomen and pelvis was performed following the standard protocol without intravenous contrast.  Comparison: 08/26/2012, 11/13/2012  Findings: Minimal atelectasis is noted in the bases bilaterally. No focal confluent infiltrate is seen.  The liver and spleen are within normal limits.  The gallbladder is contracted without evidence of stones.  The adrenal glands and pancreas are unremarkable as well.  The kidneys are well visualized bilaterally without evidence of renal calculi or urinary tract obstructive changes.  Scanning into the pelvis demonstrates a bladder to be well distended without opacified urine.  No pelvic mass lesion or side wall abnormality is noted.  The uterus has been surgically removed. A few scattered lymph nodes are noted within the mesentery although none of these are significant by size criteria.  The osseous structures are grossly unremarkable.  IMPRESSION: No acute abnormality is noted.  No significant change from prior study.   Original Report Authenticated By: Inez Catalina, M.D.   Dg Chest 2 View  11/16/2012   *RADIOLOGY REPORT*  Clinical Data: Cough  CHEST - 2 VIEW  Comparison: Aug 26, 2012  Findings: There is no focal infiltrate, pulmonary edema, or pleural effusion.  The mediastinal contour and cardiac silhouette are normal.  The soft tissues and osseous structures are stable  IMPRESSION: No acute cardiopulmonary disease identified.   Original Report Authenticated By: Abelardo Diesel, M.D.   US Renal  11/13/2012   *RADIOLOGY REPORT*  Clinical Data:  Chronic renal insufficiency.  RENAL/URINARY TRACT ULTRASOUND COMPLETE  Comparison:  None.  Findings:  Right Kidney:  Normal.  10.8 cm in length.  Left Kidney:  Normal.  10.8 cm in length.  Bladder:  Normal.  IMPRESSION: Normal bilateral renal ultrasound.   Original Report Authenticated By: Lorriane Shire, M.D.      MDM  Iv ns bolus.   Reviewed nursing notes and prior charts for additional history.    Date: 11/23/2012  Rate: 123  Rhythm: sinus tachycardia  QRS Axis: normal  Intervals: normal  ST/T Wave abnormalities: nonspecific T wave changes  Conduction Disutrbances:none  Narrative Interpretation:   Old EKG Reviewed: unchanged   Additional ns iv bolus.  Pt w dehydration, tachycardia, renal insuff/AKI - hospitalist called to admit.  Pt will also likely need psych eval/consult during hospital stay.    Mirna Mires, MD 11/23/12 [phone]

## 2012-11-23 NOTE — ED Notes (Addendum)
Pt sent over from PCP with c/o irregular heart beat.  Reports dr said she is dehydrate.  Pt reports she collapsed and just fell of the toilet. Pt is hearing impaired.  Pt reports she has a eating disorder.  Pt sts " after i was discharged I freaked out because I weighed 165, not I weigh 148"

## 2012-11-23 NOTE — Progress Notes (Signed)
UR completed 

## 2012-11-23 NOTE — H&P (Signed)
[facility] History and Physical  Brandi Love [id number] DOB: 1976/02/26 DOA: 11/23/2012  Referring physician: Dr. Lajean Saver PCP: Benito Mccreedy, MD   Chief Complaint: Lightheadedness   History of Present Illness: Brandi Love is an 37 y.o. female past medical history of bulimia, depression, personality disorder with recent admissions to behavioral health for suicide attempt in April and July of 2014 an admission to the hospital 11/12/2012-11/18/2012 for evaluation of abdominal pain and diarrhea. She had been treated for dehydration and acute renal failure with no specific etiology identified for her abdominal pain. Her C. difficile PCR test was negative and stool cultures were negative. Her creatinine returned to baseline values of 1.3-1.5 status post IV fluids. She then presented to her PCP today after a syncopal episode last night when she became lightheaded upon standing. She says she lost consciousness and woke up on the bathroom floor.  Her PCP referred her to the emergency department (due to tachycardia), where she was noted to have a creatinine elevated over her usual baseline values and clinical dehydration. Patient admits to self-induced vomiting, taking Hydroxycut and abuse of laxatives secondary to weight gain.  Last saw her therapist 3 days ago.  Review of Systems: Constitutional: No fever, no chills;  Appetite normal; No weight loss, + weight gain, no fatigue.  HEENT: No blurry vision, +floaters, no diplopia, no pharyngitis, no dysphagia CV: No chest pain, + chest pressure, no palpitations, +racing heart.  Resp: + SOB with exertion, + dry cough. GI: + nausea, + self induced vomiting 3 or more times per day, + diarrhea, no melena, no hematochezia.  GU: No dysuria, no hematuria. MSK: no myalgias, no arthralgias.  Neuro:  No headache, no focal neurological deficits, no history of seizures.  Psych: + depression, + anxiety.  Endo: No heat intolerance, no cold intolerance, no  polyuria, no polydipsia  Skin: No rashes, no skin lesions.  Heme: No easy bruising.  Past Medical History Past Medical History  Diagnosis Date  . Eating disorder   . Anxiety   . Depression   . OCD (obsessive compulsive disorder)   . Pulmonary embolism   . Seizures   . Anemia   . Tachycardia   . Hypertension   . Chronic renal insufficiency   . 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 Endometriosis   . GERD (gastroesophageal reflux disease)   . Migraine   . Deafness      Past Surgical History Past Surgical History  Procedure Laterality Date  . Cochlear implant    . Abdominal hysterectomy      'except for right ovary'  . Eye surgery Left 1980  . Eye surgery Right 1990  . Breast lumpectomy Left 2000    x2  . Breast lumpectomy Right 2002    benign  . Laparoscopic abdominal exploration  2001 and 2002    for endometriosis     Social History: History   Social History  . Marital Status: Single    Spouse Name: N/A    Number of Children: N/A  . Years of Education: N/A   Occupational History  . Not on file.   Social History Main Topics  . Smoking status: Never Smoker   . Smokeless tobacco: Never Used  . Alcohol Use: No  . Drug Use: No  . Sexually Active: No   Other Topics Concern  . Not on file   Social History Narrative  . No narrative on file    Family  History:  Family History  Problem Relation Age of Onset  . Cancer Father     Allergies: Iodinated diagnostic agents; Abilify; Dihydroergotamine; Doxycycline; Entex lq; Erythromycin; Lactose intolerance (gi); Nsaids; and Tape  Meds: Prior to Admission medications   Medication Sig Start Date End Date Taking? Authorizing Provider  acetaminophen (TYLENOL) 500 MG tablet Take 500 mg by mouth every 6 (six) hours as needed for pain (headache).   Yes Historical Provider, MD  bisacodyl (DULCOLAX) 5 MG EC tablet Take 30-35 mg by mouth daily. 7 tablets in the am and 6 tablets at noon and 7 tablets  at bedtime   Yes Historical Provider, MD  desvenlafaxine (PRISTIQ) 50 MG 24 hr tablet Take 50 mg by mouth daily.   Yes Historical Provider, MD  diphenhydrAMINE (BENADRYL) 25 mg capsule Take 25 mg by mouth every 6 (six) hours as needed (to alleviate cramps and twitching when taking haldol).   Yes Historical Provider, MD  docusate sodium (COLACE) 100 MG capsule Take 200 mg by mouth 2 (two) times daily.   Yes Historical Provider, MD  haloperidol (HALDOL) 5 MG tablet Take 5 mg by mouth every 12 (twelve) hours as needed (for anxiety or going through crisis events).   Yes Historical Provider, MD  levothyroxine (SYNTHROID, LEVOTHROID) 175 MCG tablet Take 175 mcg by mouth daily.   Yes Historical Provider, MD  LORazepam (ATIVAN) 0.5 MG tablet Take 0.5 mg by mouth 2 (two) times daily. 12 p and 8 p   Yes Historical Provider, MD  magnesium oxide (MAG-OX) 400 MG tablet Take 200 mg by mouth daily.   Yes Historical Provider, MD  Melatonin 3 MG TABS Take 3 mg by mouth at bedtime.    Yes Historical Provider, MD  methocarbamol (ROBAXIN) 750 MG tablet Take 750 mg by mouth 3 (three) times daily.   Yes Historical Provider, MD  mirtazapine (REMERON) 30 MG tablet Take 60 mg by mouth at bedtime.    Yes Historical Provider, MD  Multiple Vitamin (MULTIVITAMIN WITH MINERALS) TABS Take 1 tablet by mouth daily.   Yes Historical Provider, MD  omega-3 acid ethyl esters (LOVAZA) 1 G capsule Take 2 g by mouth 2 (two) times daily.   Yes Historical Provider, MD  omeprazole (PRILOSEC) 20 MG capsule Take 20 mg by mouth daily.   Yes Historical Provider, MD  ondansetron (ZOFRAN) 4 MG tablet Take 4 mg by mouth every 8 (eight) hours as needed for nausea.   Yes Historical Provider, MD  OVER THE COUNTER MEDICATION Take 2 capsules by mouth 2 (two) times daily. hydroxycut supplement   Yes Historical Provider, MD  pravastatin (PRAVACHOL) 20 MG tablet Take 20 mg by mouth every evening.   Yes Historical Provider, MD  pregabalin (LYRICA) 25 MG  capsule Take 25 mg by mouth 4 (four) times daily.    Yes Historical Provider, MD  QUEtiapine (SEROQUEL) 100 MG tablet Take 200 mg by mouth 2 (two) times daily. Takes at [place], 12pm   Yes Historical Provider, MD  QUEtiapine (SEROQUEL) 400 MG tablet Take 400 mg by mouth at bedtime.   Yes Historical Provider, MD  Vitamin D, Ergocalciferol, (DRISDOL) 50000 UNITS CAPS Take 50,000 Units by mouth every 7 (seven) days. On Sunday   Yes Historical Provider, MD  lamoTRIgine (LAMICTAL) 25 MG tablet Take 50 mg by mouth every morning.    Historical Provider, MD    Physical Exam: Filed Vitals:   11/23/12 1318 11/23/12 1430 11/23/12 1513  BP: 105/72 102/80 126/83  Pulse: 118  119 113  Temp: 98.9 F (37.2 C)  98.3 F (36.8 C)  TempSrc: Oral  Oral  Resp: 18 16 18   Height:   5\' 8"  (1.727 m)  Weight:   70.943 kg (156 lb 6.4 oz)  SpO2: 97% 97% 100%     Physical Exam: Blood pressure 126/83, pulse 113, temperature 98.3 F (36.8 C), temperature source Oral, resp. rate 18, height 5\' 8"  (1.727 m), weight 70.943 kg (156 lb 6.4 oz), SpO2 100.00%. Gen: No acute distress. Head: Normocephalic, atraumatic.  Cochlear implants bilaterally. Eyes: PERRL, EOMI, sclerae nonicteric. Mouth: Oropharynx clear. Neck: Supple, no thyromegaly, no lymphadenopathy, no jugular venous distention. Chest: Lungs CTAB. CV: Heart sounds are tachy, regular. Abdomen: Soft, nontender, nondistended with normal active bowel sounds. Extremities: Extremities are without C/E/C. Skin: Warm and dry. Neuro: Alert and oriented times 3; cranial nerves II through XII grossly intact. Psych: Mood and affect child-like for age.  Labs on Admission:  Basic Metabolic Panel:  Recent Labs Lab 11/17/12 0455 11/18/12 0513 11/23/12 1320  NA 142 138 133*  K 4.2 4.2 4.7  CL 113* 110 95*  CO2 21 21 19   GLUCOSE 88 105* 123*  BUN 14 13 31*  CREATININE 1.36* 1.40* 2.28*  CALCIUM 8.0* 8.2* 10.5  MG 2.0 1.8  --   PHOS 2.0* 3.0  --    Liver Function  Tests:  Recent Labs Lab 11/23/12 1320  AST 20  ALT 33  ALKPHOS 84  BILITOT 0.3  PROT 7.8  ALBUMIN 4.3   CBC:  Recent Labs Lab 11/18/12 0513 11/23/12 1320  WBC 6.4 12.6*  NEUTROABS  --  9.0*  HGB 10.6* 15.6*  HCT 31.6* 45.0  MCV 89.5 88.9  PLT 186 364   Cardiac Enzymes:  Recent Labs Lab 11/23/12 1320  TROPONINI <0.30     Radiological Exams on Admission: No results found.  EKG: Independently reviewed. Sinus tachycardia at 123 bpm.  Non-specific T wave abnormalities.  Assessment/Plan Principal Problem:   Acute on chronic renal failure secondary to dehydration from self induced vomiting and laxative abuse / Syncope -Admit for re-hydration.  Suspect syncopal event was a vasovagal response to dehydration. -Psychiatric consultation for consideration of inpatient stabilization.  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 Active Problems:   Sinus tachycardia -Monitor on telemetry.  Likely from dehydration.   Hyponatremia -Likely from dehydration.  Hydrate.   Borderline personality disorder -Psychiatric consultation requested.   Protein-calorie malnutrition, severe -Dietician consultation.   Eating disorder, unspecified -Safety sitter requested secondary to history of self induced emesis and laxative abuse.   Unspecified hypothyroidism -Continue synthroid.   Dyslipidemia -Continue statin and fish oil.   Code Status: Full. Family Communication: No family at bedside. Disposition Plan: Home versus inpatient psychiatric hospitalization.  Time spent: 70 minutes.  Amunique Neyra [facility] Pager [phone]  If 7PM-7AM, please contact night-coverage [email or web address] Password [email or web address] 11/23/2012, 3:50 PM

## 2012-11-23 NOTE — Progress Notes (Signed)

## 2012-11-24 DIAGNOSIS — F603 Borderline personality disorder: Secondary | ICD-10-CM

## 2012-11-24 DIAGNOSIS — E039 Hypothyroidism, unspecified: Secondary | ICD-10-CM

## 2012-11-24 DIAGNOSIS — E860 Dehydration: Secondary | ICD-10-CM

## 2012-11-24 DIAGNOSIS — E785 Hyperlipidemia, unspecified: Secondary | ICD-10-CM

## 2012-11-24 DIAGNOSIS — I498 Other specified cardiac arrhythmias: Secondary | ICD-10-CM

## 2012-11-24 DIAGNOSIS — F502 Bulimia nervosa: Secondary | ICD-10-CM

## 2012-11-24 DIAGNOSIS — F429 Obsessive-compulsive disorder, unspecified: Secondary | ICD-10-CM

## 2012-11-24 DIAGNOSIS — N189 Chronic kidney disease, unspecified: Secondary | ICD-10-CM

## 2012-11-24 DIAGNOSIS — F339 Major depressive disorder, recurrent, unspecified: Secondary | ICD-10-CM

## 2012-11-24 DIAGNOSIS — N179 Acute kidney failure, unspecified: Principal | ICD-10-CM

## 2012-11-24 LAB — BASIC METABOLIC PANEL
BUN: 24 mg/dL — ABNORMAL HIGH (ref 6–23)
CO2: 21 mEq/L (ref 19–32)
Calcium: 7.9 mg/dL — ABNORMAL LOW (ref 8.4–10.5)
Creatinine, Ser: 1.49 mg/dL — ABNORMAL HIGH (ref 0.50–1.10)
Glucose, Bld: 115 mg/dL — ABNORMAL HIGH (ref 70–99)

## 2012-11-24 LAB — URINALYSIS, ROUTINE W REFLEX MICROSCOPIC
Bilirubin Urine: NEGATIVE
Ketones, ur: NEGATIVE mg/dL
Nitrite: NEGATIVE
pH: 5 (ref 5.0–8.0)

## 2012-11-24 NOTE — Progress Notes (Signed)
[facility] PROGRESS NOTE  Brandi Love [id number] DOB: [birth year] DOA: 11/23/2012 PCP: Benito Mccreedy, MD  Assessment/Plan: Acute on chronic renal failure secondary to dehydration from self induced vomiting and laxative abuse / Syncope  -Syncopal event was a vasovagal response to dehydration and vomiting induction.  -Psychiatric consultation for consideration of inpatient stabilization. Patient at risk for self harm given the fact she doesn't understand that her behavior is unsafe..  -continue safety sitter for now (especially to prevent self induced vomiting).  -orthostatic VS in am  Sinus tachycardia  -improved with fluid resuscitation. -EKG reviewed and is sinus in nature -continue IVF's; etiology is likely dehydration.   Hyponatremia  -Likely from dehydration. Hydrate.   Borderline personality disorder  -Psychiatric consultation requested; will follow recommendations   Protein-calorie malnutrition, severe  -Dietician consultation.   Eating disorder, unspecified  -will continue Safety sitter secondary to history of self induced emesis and laxative abuse.  -follow psych rec's  Unspecified hypothyroidism  -Continue synthroid.   Dyslipidemia  -Continue statin and fish oil.   Code Status: Full Family Communication: no family at bedside Disposition Plan: follow psych consult recommendations; patient is not able to understand that her bulimic behavior is bad for her; in fact has told once she return home needs to take some laxatives to lose weight gain here.    Consultants:  Psych  Procedures:  none  Antibiotics:  none  HPI/Subjective: Afebrile, feeling ok. Flat affect. Patient denies nausea or vomiting. Tolerate breakfast and lunch   Objective: Filed Vitals:   11/24/12 1415  BP: 105/71  Pulse: 104  Temp: 97.9 F (36.6 C)  Resp: 16    Intake/Output Summary (Last 24 hours) at 11/24/12 1709 Last data filed at 11/24/12 1400  Gross per 24  hour  Intake 2990.83 ml  Output      4 ml  Net 2986.83 ml   Filed Weights   11/23/12 1513  Weight: 70.943 kg (156 lb 6.4 oz)    Exam:   General: NAD, afebrile  Cardiovascular: mild tachycardia, no rubs or gallops  Respiratory: CTA bilaterally  Abdomen: soft, NT, ND, positive BS  Musculoskeletal: trace edema bilaterally  Data Reviewed: Basic Metabolic Panel:  Recent Labs Lab 11/18/12 0513 11/23/12 1320 11/24/12 0443  NA 138 133* 138  K 4.2 4.7 3.5  CL 110 95* 107  CO2 21 19 21   GLUCOSE 105* 123* 115*  BUN 13 31* 24*  CREATININE 1.40* 2.28* 1.49*  CALCIUM 8.2* 10.5 7.9*  MG 1.8  --   --   PHOS 3.0  --   --    Liver Function Tests:  Recent Labs Lab 11/23/12 1320  AST 20  ALT 33  ALKPHOS 84  BILITOT 0.3  PROT 7.8  ALBUMIN 4.3   CBC:  Recent Labs Lab 11/18/12 0513 11/23/12 1320  WBC 6.4 12.6*  NEUTROABS  --  9.0*  HGB 10.6* 15.6*  HCT 31.6* 45.0  MCV 89.5 88.9  PLT 186 364   Cardiac Enzymes:  Recent Labs Lab 11/23/12 1320  TROPONINI <0.30    Recent Results (from the past 240 hour(s))  URINE CULTURE     Status: None   Collection Time    11/15/12  3:34 AM      Result Value Range Status   Specimen Description URINE, CLEAN CATCH   Final   Special Requests NONE   Final   Culture  Setup Time 11/15/2012 19:22   Final   Colony Count 50,000 COLONIES/ML   Final  Culture KLEBSIELLA PNEUMONIAE   Final   Report Status 11/17/2012 FINAL   Final   Organism ID, Bacteria KLEBSIELLA PNEUMONIAE   Final     Studies: No results found.  Scheduled Meds: . desvenlafaxine  50 mg Oral Daily  . enoxaparin (LOVENOX) injection  40 mg Subcutaneous Q24H  . lamoTRIgine  50 mg Oral q morning - 10a  . levothyroxine  175 mcg Oral Daily  . LORazepam  1 mg Intravenous Once  . LORazepam  0.5 mg Oral Custom  . mirtazapine  60 mg Oral QHS  . multivitamin with minerals  1 tablet Oral Daily  . omega-3 acid ethyl esters  2 g Oral BID  . pantoprazole  40 mg Oral  Daily  . pregabalin  25 mg Oral QID  . QUEtiapine  200 mg Oral Custom  . QUEtiapine  400 mg Oral QHS  . simvastatin  10 mg Oral q1800  . sodium chloride  3 mL Intravenous Q12H  . Vitamin D (Ergocalciferol)  50,000 Units Oral Q Sun   Continuous Infusions: . sodium chloride 125 mL/hr at 11/24/12 1603    Principal Problem:   Acute on chronic renal failure Active Problems:   Dehydration   Hyponatremia   Borderline personality disorder   Protein-calorie malnutrition, severe   Eating disorder, unspecified   Unspecified hypothyroidism   Dyslipidemia   Sinus tachycardia    Time spent: >30    Nikea Settle  [facility] Pager [phone]. If 7PM-7AM, please contact night-coverage at [email or web address], password [facility] 11/24/2012, 5:09 PM  LOS: 1 day

## 2012-11-24 NOTE — Progress Notes (Signed)
NT called RN to room reporting patient is vomiting. NT asked patient to stop and return to bed but patient continued, pt also continued when RN entered and asked patient to stop.  Pt had fingers in mouth over toilet.
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
## 2012-11-24 NOTE — Progress Notes (Signed)
Visited patient today at the request of nursing staff. She is friendly. Offered encouragement, listened, and had prayer. Asked for peace and calm and increased health and wholeness. Patient was appreciative of my visit.

## 2012-11-25 DIAGNOSIS — R1013 Epigastric pain: Secondary | ICD-10-CM

## 2012-11-25 LAB — BASIC METABOLIC PANEL
BUN: 16 mg/dL (ref 6–23)
Calcium: 8.1 mg/dL — ABNORMAL LOW (ref 8.4–10.5)
GFR calc non Af Amer: 53 mL/min — ABNORMAL LOW (ref >90)
Glucose, Bld: 85 mg/dL (ref 70–99)

## 2012-11-25 LAB — CBC
MCH: 30.9 pg (ref 26.0–34.0)
MCHC: 33.8 g/dL (ref 30.0–36.0)
Platelets: 201 10*3/uL (ref 150–400)

## 2012-11-25 MED ORDER — MORPHINE SULFATE 4 MG/ML IJ SOLN
4.0000 mg | Freq: Once | INTRAMUSCULAR | Status: AC
Start: 2012-11-25 — End: 2012-11-25
  Administered 2012-11-25: 4 mg via INTRAVENOUS
  Filled 2012-11-25: qty 1

## 2012-11-25 MED ORDER — OXYCODONE-ACETAMINOPHEN 5-325 MG PO TABS: 1.0000 | ORAL_TABLET | ORAL | Status: DC | PRN

## 2012-11-25 NOTE — Progress Notes (Signed)
Patient noted to be self-inducing vomiting during shift change.  Sitter notified myself and oncoming nurse and pt was in bathroom making herself vomit.  Patient threatening to leave AMA.

## 2012-11-25 NOTE — Progress Notes (Signed)
[facility] PROGRESS NOTE  Brandi Love [id number] DOB: 1975/07/24 DOA: 11/23/2012 PCP: Benito Mccreedy, MD  Assessment/Plan: Acute on chronic renal failure secondary to dehydration from self induced vomiting and laxative abuse / Syncope  -Syncopal event was a vasovagal response to dehydration and vomiting induction.  -Psychiatric consultation for consideration of inpatient stabilization. Patient at risk for self harm given the fact she doesn't understand that her behavior is unsafe..  -continue safety sitter for now (especially to prevent self induced vomiting).  -improved, creatinine at baseline, cut down IVF  Sinus tachycardia  -improved with fluid resuscitation. -EKG reviewed and is sinus in nature -continue IVF's; etiology is likely dehydration.   Hyponatremia  -Likely from dehydration. Hydrate.   Borderline personality disorder  -Psychiatric consultation requested; no med changes recommended   Protein-calorie malnutrition, severe  -Dietician consultation.   Eating disorder, unspecified  -will continue Safety sitter secondary to history of self induced emesis and laxative abuse.  -d/w Psych CSW who will eval for psychiatric facilities  Unspecified hypothyroidism  -Continue synthroid.   Dyslipidemia  -Continue statin and fish oil.   Code Status: Full Family Communication: no family at bedside Disposition Plan: pending   Consultants:  Psych  Procedures:  none  Antibiotics:  none  HPI/Subjective: Afebrile, feeling ok. Flat affect. Patient c/o nausea this am, no intake yet today, ate breakfast and lunch, threw up dinner  Objective: Filed Vitals:   11/25/12 0539  BP: 99/66  Pulse: 79  Temp: 97.7 F (36.5 C)  Resp: 18    Intake/Output Summary (Last 24 hours) at 11/25/12 1320 Last data filed at 11/25/12 1250  Gross per 24 hour  Intake 3391.67 ml  Output      8 ml  Net 3383.67 ml   Filed Weights   11/23/12 1513  Weight: 70.943 kg  (156 lb 6.4 oz)    Exam:   General: NAD, afebrile  Cardiovascular: mild tachycardia, no rubs or gallops  Respiratory: CTA bilaterally  Abdomen: soft, NT, ND, positive BS  Musculoskeletal: trace edema bilaterally  Data Reviewed: Basic Metabolic Panel:  Recent Labs Lab 11/23/12 1320 11/24/12 0443 11/25/12 0838  NA 133* 138 138  K 4.7 3.5 4.4  CL 95* 107 111  CO2 19 21 21   GLUCOSE 123* 115* 85  BUN 31* 24* 16  CREATININE 2.28* 1.49* 1.29*  CALCIUM 10.5 7.9* 8.1*   Liver Function Tests:  Recent Labs Lab 11/23/12 1320  AST 20  ALT 33  ALKPHOS 84  BILITOT 0.3  PROT 7.8  ALBUMIN 4.3   CBC:  Recent Labs Lab 11/23/12 1320 11/25/12 0838  WBC 12.6* 5.2  NEUTROABS 9.0*  --   HGB 15.6* 11.2*  HCT 45.0 33.1*  MCV 88.9 91.2  PLT 364 201   Cardiac Enzymes:  Recent Labs Lab 11/23/12 1320  TROPONINI <0.30    No results found for this or any previous visit (from the past 240 hour(s)).   Studies: No results found.  Scheduled Meds: . desvenlafaxine  50 mg Oral Daily  . enoxaparin (LOVENOX) injection  40 mg Subcutaneous Q24H  . lamoTRIgine  50 mg Oral q morning - 10a  . levothyroxine  175 mcg Oral Daily  . LORazepam  1 mg Intravenous Once  . LORazepam  0.5 mg Oral Custom  . mirtazapine  60 mg Oral QHS  . multivitamin with minerals  1 tablet Oral Daily  . omega-3 acid ethyl esters  2 g Oral BID  . pantoprazole  40 mg Oral Daily  .  pregabalin  25 mg Oral QID  . QUEtiapine  200 mg Oral Custom  . QUEtiapine  400 mg Oral QHS  . simvastatin  10 mg Oral q1800  . sodium chloride  3 mL Intravenous Q12H  . Vitamin D (Ergocalciferol)  50,000 Units Oral Q Sun   Continuous Infusions: . sodium chloride 75 mL/hr at 11/25/12 0422    Principal Problem:   Acute on chronic renal failure Active Problems:   Dehydration   Hyponatremia   Borderline personality disorder   Protein-calorie malnutrition, severe   Eating disorder, unspecified   Unspecified  hypothyroidism   Dyslipidemia   Sinus tachycardia    Time spent: >30    [facility] Pager [phone]. If 7PM-7AM, please contact night-coverage at [email or web address], password [facility] 11/25/2012, 1:20 PM  LOS: 2 days

## 2012-11-25 NOTE — Progress Notes (Signed)
Clinical Social Work Progress Note PSYCHIATRY SERVICE LINE 11/25/2012  Patient:  Brandi Love  Account:  [id number]  [place] Date:  11/23/2012  Clinical Social Worker:  Sindy Messing, LCSW  Date/Time:  11/25/2012 12:00 N  Review of Patient  Overall Medical Condition:   Patient reports she is not feeling well today.   Participation Level:  Active  Participation Quality  Appropriate   Other Participation Quality:   Patient engaged throughout session.   Affect  Flat   Cognitive  Appropriate   Reaction to Medications/Concerns:   None reported   Modes of Intervention  Solution-Focused  Support   Summary of Progress/Plan at Discharge   CSW met with patient at bedside. Patient laying in bed and reports she is not feeling well today. Patient reports she ate breakfast and lunch yesterday but after eating dinner felt she needed to vomit. Patient reports frustration with having a sitter and reports that sitter will not stop her from vomiting if she wants to.    CSW spoke with patient regarding treatment at [facility]. Patient reports that after talking with MD she would consider residential treatment. CSW explained that several facilities had been contacted but they do not accept Medicare/Medicaid. CSW inquired if family could assist with cost. Patient reports that she and family do not have the money to finance treatment.    CSW has contacted the following facilities:    [facility] spoke with admissions who reports that patient does not meet criteria for admission. Patient would have to be at 80% of ideal body weight and patient is currently over the ideal body weight of 143 pounds.    [facility]- Admissions reports that they do not accept Medicare/Medicaid for inpatient treatment.    [facility]- CSW left a message but per website, facility only accepts private insurance.    [facility]- Admission reports that they only accept private pay or private insurance.    [facility] left  a message but per website, only private pay or private insurance accepted.    [facility]- Patient does not meet criteria    Attending MD called and requested that CSW continue to search for placement due to concern if patient returns home then she will continue to vomit. CSW spoke with following hospitals who all report that they do not provide treatment for eating disorders:    [facility]    CSW has kept in contact with ACT team who is aware of barrier for placement and is agreeable to continue to follow patient for care and that patient can meet with eating disorder specialist twice a week.    CSW will continue to follow but at this time there is no inpatient  facility agreeable to accept patient.

## 2012-11-25 NOTE — Progress Notes (Signed)
K. Schorr,NP called and notified that pt was attempting to leave AMA. Pt states that she is not staying and is pulling off her telemetry and attempting to pull out IV. Explained to pt that she needs to stay in order to receive treatment and pt is refusing to listen and still states that she is leaving. Pt states that she has a car and has a way home. IV site discontinued. K. Schorr in route to the floor to talk to the pt.

## 2012-11-26 LAB — BASIC METABOLIC PANEL
Calcium: 8.1 mg/dL — ABNORMAL LOW (ref 8.4–10.5)
Creatinine, Ser: 1.32 mg/dL — ABNORMAL HIGH (ref 0.50–1.10)
GFR calc non Af Amer: 52 mL/min — ABNORMAL LOW (ref >90)
Glucose, Bld: 86 mg/dL (ref 70–99)
Sodium: 140 mEq/L (ref 135–145)

## 2012-11-26 NOTE — Progress Notes (Signed)
Comment:  11/25/12 @ 1950 Notified by RN pt has taken out her IV, packed her belongings and states she is leaving. Pt apparently became angry when RN and sitter intervened when she was found forcing herself to vomit. Pt has signed AMA form. Went to bedside to speak with pt.  Pt is very tearful and states she is "tired of dealing with this". Much discussion regarding liklihood of early death or disability as a result of her current eating disorder. She denies SI/HI but seems to have a limited ability to connect her current health issues to her behavior. Pt allowed me too restart her IV and has agreed to remain on telemetry until am. I have explained the sitter will have to remain and we can not allow her to "purge" wihtout intervening. She does c/o LBP for which I agreed to give her a percocet. Will continue to monitor closely.  Jeryl Columbia, NP-C [facility] Pager [phone]

## 2012-11-26 NOTE — Progress Notes (Signed)
Patient discharge home, alert and oriented, discharge instructions given, patient verbalize understanding of discharge instructions given, My Chart declined at this time will access at a later time, patient in stable condition at this time

## 2012-11-26 NOTE — Progress Notes (Signed)
Pt agrees to stay in the hospital after talking to [facility] . Pt cooperative and pleasant at this time. IV restarted by Fransisca Connors

## 2012-12-01 ENCOUNTER — Encounter (HOSPITAL_COMMUNITY): Payer: Self-pay | Admitting: *Deleted

## 2012-12-01 ENCOUNTER — Emergency Department (INDEPENDENT_AMBULATORY_CARE_PROVIDER_SITE_OTHER)
Admission: EM | Admit: 2012-12-01 | Discharge: 2012-12-01 | Disposition: A | Discharge status: Nursing Home (Includes ICF) | Payer: Medicare Other | Source: Home / Self Care | Attending: Emergency Medicine | Admitting: Emergency Medicine

## 2012-12-01 ENCOUNTER — Emergency Department (HOSPITAL_COMMUNITY)
Admission: EM | Admit: 2012-12-01 | Discharge: 2012-12-02 | Disposition: A | Discharge status: Home / Self Care | Payer: Medicare Other | Attending: Emergency Medicine | Admitting: Emergency Medicine

## 2012-12-01 DIAGNOSIS — Z8742 Personal history of other diseases of the female genital tract: Secondary | ICD-10-CM | POA: Insufficient documentation

## 2012-12-01 DIAGNOSIS — N19 Unspecified kidney failure: Secondary | ICD-10-CM

## 2012-12-01 DIAGNOSIS — Z862 Personal history of diseases of the blood and blood-forming organs and certain disorders involving the immune mechanism: Secondary | ICD-10-CM | POA: Insufficient documentation

## 2012-12-01 DIAGNOSIS — R5383 Other fatigue: Secondary | ICD-10-CM | POA: Insufficient documentation

## 2012-12-01 DIAGNOSIS — Z8669 Personal history of other diseases of the nervous system and sense organs: Secondary | ICD-10-CM | POA: Insufficient documentation

## 2012-12-01 DIAGNOSIS — N189 Chronic kidney disease, unspecified: Secondary | ICD-10-CM | POA: Insufficient documentation

## 2012-12-01 DIAGNOSIS — R000 Tachycardia, unspecified: Secondary | ICD-10-CM

## 2012-12-01 DIAGNOSIS — F502 Bulimia nervosa: Secondary | ICD-10-CM

## 2012-12-01 DIAGNOSIS — E876 Hypokalemia: Secondary | ICD-10-CM

## 2012-12-01 DIAGNOSIS — E860 Dehydration: Secondary | ICD-10-CM

## 2012-12-01 DIAGNOSIS — I498 Other specified cardiac arrhythmias: Secondary | ICD-10-CM

## 2012-12-01 DIAGNOSIS — G40909 Epilepsy, unspecified, not intractable, without status epilepticus: Secondary | ICD-10-CM | POA: Insufficient documentation

## 2012-12-01 DIAGNOSIS — R1084 Generalized abdominal pain: Secondary | ICD-10-CM | POA: Insufficient documentation

## 2012-12-01 DIAGNOSIS — F429 Obsessive-compulsive disorder, unspecified: Secondary | ICD-10-CM | POA: Insufficient documentation

## 2012-12-01 DIAGNOSIS — Z86711 Personal history of pulmonary embolism: Secondary | ICD-10-CM | POA: Insufficient documentation

## 2012-12-01 DIAGNOSIS — Z79899 Other long term (current) drug therapy: Secondary | ICD-10-CM | POA: Insufficient documentation

## 2012-12-01 DIAGNOSIS — F509 Eating disorder, unspecified: Secondary | ICD-10-CM | POA: Insufficient documentation

## 2012-12-01 DIAGNOSIS — R079 Chest pain, unspecified: Secondary | ICD-10-CM

## 2012-12-01 DIAGNOSIS — R5381 Other malaise: Secondary | ICD-10-CM | POA: Insufficient documentation

## 2012-12-01 DIAGNOSIS — K219 Gastro-esophageal reflux disease without esophagitis: Secondary | ICD-10-CM | POA: Insufficient documentation

## 2012-12-01 DIAGNOSIS — I129 Hypertensive chronic kidney disease with stage 1 through stage 4 chronic kidney disease, or unspecified chronic kidney disease: Secondary | ICD-10-CM | POA: Insufficient documentation

## 2012-12-01 DIAGNOSIS — R112 Nausea with vomiting, unspecified: Secondary | ICD-10-CM | POA: Insufficient documentation

## 2012-12-01 DIAGNOSIS — F3289 Other specified depressive episodes: Secondary | ICD-10-CM | POA: Insufficient documentation

## 2012-12-01 DIAGNOSIS — Z3202 Encounter for pregnancy test, result negative: Secondary | ICD-10-CM | POA: Insufficient documentation

## 2012-12-01 DIAGNOSIS — F329 Major depressive disorder, single episode, unspecified: Secondary | ICD-10-CM | POA: Insufficient documentation

## 2012-12-01 DIAGNOSIS — F411 Generalized anxiety disorder: Secondary | ICD-10-CM | POA: Insufficient documentation

## 2012-12-01 DIAGNOSIS — E039 Hypothyroidism, unspecified: Secondary | ICD-10-CM | POA: Insufficient documentation

## 2012-12-01 DIAGNOSIS — G43909 Migraine, unspecified, not intractable, without status migrainosus: Secondary | ICD-10-CM | POA: Insufficient documentation

## 2012-12-01 LAB — POCT I-STAT, CHEM 8
Glucose, Bld: 133 mg/dL — ABNORMAL HIGH (ref 70–99)
HCT: 44 % (ref 36.0–46.0)
Hemoglobin: 15 g/dL (ref 12.0–15.0)
Potassium: 4.4 mEq/L (ref 3.5–5.1)
Sodium: 136 mEq/L (ref 135–145)
TCO2: 19 mmol/L (ref 0–100)

## 2012-12-01 MED ORDER — SODIUM CHLORIDE 0.9 % IV BOLUS (SEPSIS)
1000.0000 mL | Freq: Once | INTRAVENOUS | Status: AC
  Administered 2012-12-01: 1000 mL via INTRAVENOUS

## 2012-12-01 MED ORDER — SODIUM CHLORIDE 0.9 % IV BOLUS (SEPSIS): 1000.0000 mL | Freq: Once | INTRAVENOUS | Status: DC

## 2012-12-01 NOTE — ED Notes (Signed)
IV team paged regarding lab draws for this patient.

## 2012-12-01 NOTE — ED Notes (Signed)
She also has abd pain  lmp hysterectomy

## 2012-12-01 NOTE — ED Notes (Signed)
The pt was sent from [facility] with dizziness and she was told there that she was dehydrated and needed fluids

## 2012-12-01 NOTE — ED Notes (Addendum)
C/o eating and purging when she got out of the hospital last [place].  Taking a lot of laxatives-Bisacodyl 5 mg. 20-25 /day. Also took a 25 mg. one today.  C/o nausea, weakness, dizziness and heart palpatations onset 2 days ago. C/o SOB when she goes up the steps. States she was eating and purging in the hospital and they put a sitter with her.  Denies being suicidal.  States she has an eating disorder.  C/o cramping pain in her abdomen constantly for 2 days.

## 2012-12-01 NOTE — ED Provider Notes (Signed)
CSN: [id number]     Arrival date & time 12/01/12  1924 History     First MD Initiated Contact with Patient 12/01/12 2138     Chief Complaint  Patient presents with  . Dizziness   (Consider location/radiation/quality/duration/timing/severity/associated sxs/prior Treatment) HPI Brandi Love is a 37 y.o. female who presents to ED with complaint of weakness and dizziness, eating disorder. Pt reports long history of purging followed by period of not eating. States has been in and out of the hospital for the same. States has been making herself throw up and also has been taking laxative pills. States that she was just dscharged 5 days ago after being admitted in the hospital. Since then states lost 7 lb. Was seen by her therapist today and was sent to an UC who sent her here. Pt reports diffuse abdominal pain. No fever, chills. States feels dizzy. Denies SI or HI.   Past Medical History  Diagnosis Date  . Eating disorder   . Anxiety   . Depression   . OCD (obsessive compulsive disorder)   . Pulmonary embolism   . Seizures   . Anemia   . Tachycardia   . Hypertension   . Chronic renal insufficiency   . 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 Endometriosis   . GERD (gastroesophageal reflux disease)   . Migraine   . Deafness    Past Surgical History  Procedure Laterality Date  . Cochlear implant    . Abdominal hysterectomy      'except for right ovary'  . Eye surgery Left 1980  . Eye surgery Right 1990  . Breast lumpectomy Left 2000    x2  . Breast lumpectomy Right 2002    benign  . Laparoscopic abdominal exploration  2001 and 2002    for endometriosis   Family History  Problem Relation Age of Onset  . Cancer Father   . Hyperlipidemia Father   . Osteoarthritis Mother   . Hyperlipidemia Mother    History  Substance Use Topics  . Smoking status: Never Smoker   . Smokeless tobacco: Never Used  . Alcohol Use: No   OB History   Grav Para Term Preterm  Abortions TAB SAB Ect Mult Living                 Review of Systems  Constitutional: Positive for fatigue. Negative for fever and chills.  HENT: Negative for neck pain and neck stiffness.   Respiratory: Negative.   Cardiovascular: Negative.   Gastrointestinal: Positive for nausea, vomiting and abdominal pain.  Genitourinary: Negative for dysuria and flank pain.  Neurological: Positive for weakness.    Allergies  Iodinated diagnostic agents; Abilify; Dihydroergotamine; Doxycycline; Entex lq; Erythromycin; Lactose intolerance (gi); Nsaids; and Tape  Home Medications   Current Outpatient Rx  Name  Route  Sig  Dispense  Refill  . acetaminophen (TYLENOL) 500 MG tablet   Oral   Take 500 mg by mouth every 6 (six) hours as needed for pain (headache).         . bisacodyl (DULCOLAX) 5 MG EC tablet   Oral   Take 30-35 mg by mouth daily. 7 tablets in the am and 6 tablets at noon and 7 tablets at bedtime         . desvenlafaxine (PRISTIQ) 50 MG 24 hr tablet   Oral   Take 50 mg by mouth daily.         . diphenhydrAMINE (BENADRYL)  25 mg capsule   Oral   Take 25 mg by mouth every 6 (six) hours as needed (to alleviate cramps and twitching when taking haldol).         Marland Kitchen docusate sodium (COLACE) 100 MG capsule   Oral   Take 200 mg by mouth 2 (two) times daily.         . haloperidol (HALDOL) 5 MG tablet   Oral   Take 5 mg by mouth every 12 (twelve) hours as needed (for anxiety or going through crisis events).         . lamoTRIgine (LAMICTAL) 25 MG tablet   Oral   Take 50 mg by mouth every morning.         Marland Kitchen levothyroxine (SYNTHROID, LEVOTHROID) 175 MCG tablet   Oral   Take 175 mcg by mouth daily.         Marland Kitchen LORazepam (ATIVAN) 0.5 MG tablet   Oral   Take 0.5 mg by mouth 2 (two) times daily. 12 p and 8 p         . magnesium oxide (MAG-OX) 400 MG tablet   Oral   Take 200 mg by mouth daily.         . Melatonin 3 MG TABS   Oral   Take 3 mg by mouth at bedtime.           . methocarbamol (ROBAXIN) 750 MG tablet   Oral   Take 750 mg by mouth 3 (three) times daily.         . mirtazapine (REMERON) 30 MG tablet   Oral   Take 60 mg by mouth at bedtime.          . Multiple Vitamin (MULTIVITAMIN WITH MINERALS) TABS   Oral   Take 1 tablet by mouth daily.         Marland Kitchen omega-3 acid ethyl esters (LOVAZA) 1 G capsule   Oral   Take 2 g by mouth 2 (two) times daily.         Marland Kitchen omeprazole (PRILOSEC) 20 MG capsule   Oral   Take 20 mg by mouth daily.         . ondansetron (ZOFRAN) 4 MG tablet   Oral   Take 4 mg by mouth every 8 (eight) hours as needed for nausea.         . pravastatin (PRAVACHOL) 20 MG tablet   Oral   Take 20 mg by mouth every evening.         . pregabalin (LYRICA) 25 MG capsule   Oral   Take 25 mg by mouth 4 (four) times daily.          . QUEtiapine (SEROQUEL) 100 MG tablet   Oral   Take 200 mg by mouth 2 (two) times daily. Takes at 8am, 12pm         . QUEtiapine (SEROQUEL) 400 MG tablet   Oral   Take 400 mg by mouth at bedtime.         . SUMAtriptan (IMITREX) 100 MG tablet   Oral   Take 100 mg by mouth every 2 (two) hours as needed for migraine.         . Vitamin D, Ergocalciferol, (DRISDOL) 50000 UNITS CAPS   Oral   Take 50,000 Units by mouth every 7 (seven) days. On Sunday          BP 123/93  Pulse 114  Temp(Src) 98.4 F (36.9 C) (Oral)  Resp  25  SpO2 94% Physical Exam  Nursing note and vitals reviewed. Constitutional: She is oriented to person, place, and time. She appears well-developed and well-nourished. No distress.  Eyes: Conjunctivae are normal.  Neck: Neck supple.  Cardiovascular: Regular rhythm.   No murmur heard. tachycardic  Pulmonary/Chest: Effort normal. No respiratory distress. She has no wheezes. She has no rales.  Abdominal: Soft. Bowel sounds are normal. She exhibits no distension. There is no tenderness. There is no rebound.  Musculoskeletal: She exhibits no edema.   Neurological: She is alert and oriented to person, place, and time.  Skin: Skin is warm and dry.  Psychiatric:  Flat affect    ED Course   Procedures (including critical care time)  Labs Reviewed  CBC WITH DIFFERENTIAL  COMPREHENSIVE METABOLIC PANEL  URINALYSIS W MICROSCOPIC + REFLEX CULTURE   No results found. 1. Dehydration   2. Eating disorder   3. Hypokalemia     MDM  PT with a long history of eating disorder, sent here for dehydration and psych eval. Pt has had two admissions for the same int he last month. Today she does appear dehydrated. She was treated in ED with IV fluids, total 3L. Her HR came down to 90s, low 100s from original 140bpm. She is feeling well. She is not suicidal or homicidal and at this time does not qualify for inpatient admission for her psychiatric issue. She has a psychiatrist and therapist that she sees. She also had a recent psychiatric evaluation and was also found not to be appropriate for inpatient treatment. Pt is signed out at shift change pending lab work. Plan if labs OK to d/c home with close follow up with PCP today. Pt agreeable to the plan.   Filed Vitals:   12/01/12 2228 12/02/12 0100 12/02/12 0300 12/02/12 0412  BP: 123/93 105/69 117/89 111/80  Pulse: 114 104 100 103  Temp:      TempSrc:      Resp: 25 20 14 16   SpO2: 94% 98% 99% 98%     Renold Genta, PA-C 12/04/12 0751

## 2012-12-01 NOTE — ED Notes (Signed)
HR 120 on EKG.

## 2012-12-01 NOTE — ED Notes (Signed)
Pt from [facility]. Report given by Renne Musca. Pt has history of eating disorder Bulimia. Was just discharged from [facility] for Bulimia and had to have a sitter to prevent her from purging. Pt binge purges by throwing up and taking laxatives. Pt c/o nausea, weakness, SOB with exertion and palpitations. EKG performed, Urine dipstick performed, I-stat chem 8 performed. BUN 25, Creatinine 1.6. Vital signs: 113/76, HR 120, Temp 98.2, RR 20, O2 sats 97% on RA. Pt has a flat affect but denies SI.

## 2012-12-01 NOTE — ED Provider Notes (Signed)
Chief Complaint:   Chief Complaint  Patient presents with  . Eating Disorder  . Weakness  . Nausea    History of Present Illness:   Brandi Love is an unfortunate 37 year old female with severe bulimia, chronic renal failure, hypothyroidism, and congenital deafness with a cochlear implant. She is sent over by her psychiatrist today because of rapid heartbeat, weakness, dizziness, nausea, palpitations, dyspnea on exertion, and abdominal pain. She has been overdosing on laxatives taking 20-25 bisacodyl laxatives per day which caused diarrhea. She also has abdominal pain and self induces vomiting. She's not been eating for the past week and her weight has declined from 156 her last hospital discharge, one week ago, 249 today. She was hospitalized at [facility] for similar issues on July 24 and August 4. She's just been out of the hospital about a week. She admits to being mildly depressed but not suicidal. She denies any chest pain, pressure, or tightness.  Review of Systems:  Other than noted above, the patient denies any of the following symptoms. Systemic:  No fever, chills, or fatigue. Pulmonary:  No cough, wheezing, shortness of breath. Cardiac:  No chest pain, tightness, pressure, dizziness, presyncope, syncope, PND, orthopnea, or edema. Ext:  No leg pain or swelling. Neuro:  No weakness, paresthesias, or difficulty with speech or gait. Psych:  No anxiety or depression. Endo:  No weight loss, tremor, sweats, or heat intolerance.   [place]:  Past medical history, family history, social history, meds, and allergies were reviewed and updated as needed. No history of cardiac disease.  No history of excessive alcohol intake.  She has a long list of allergies and intolerance to egg with a long list of medications.  Physical Exam:   Vital signs:  BP 113/76  Pulse 140  Temp(Src) 98.2 F (36.8 C) (Oral)  Resp 20  SpO2 97% Gen:  Alert, oriented, in no distress, skin warm and dry. Eye:   PERRL, lids and conjunctivas normal.  No stare or lid lag. ENT:  Mucous membranes moist, pharynx clear. Neck:  Supple, no adenopathy or tenderness.  No JVD.  Thyroid not enlarged. Lungs:  Clear to auscultation, no wheezes, rales or rhonchi.  No respiratory distress. Heart:  Regular rhythm, no extrasystoles.  No gallops, murmers, clicks or rubs. Abdomen:  Soft, nontender, no organomegaly or mass.  Bowel sounds normal.  No pulsatile abdominal mass or bruit. Ext:  No edema. Pulses full and equal. Skin:  Warm and dry.  No rash.  Labs:   Results for orders placed during the hospital encounter of 12/01/12  POCT I-STAT, CHEM 8      Result Value Range   Sodium 136  135 - 145 mEq/L   Potassium 4.4  3.5 - 5.1 mEq/L   Chloride 107  96 - 112 mEq/L   BUN 25 (*) 6 - 23 mg/dL   Creatinine, Ser 1.60 (*) 0.50 - 1.10 mg/dL   Glucose, Bld 133 (*) 70 - 99 mg/dL   Calcium, Ion 1.16  1.12 - 1.23 mmol/L   TCO2 19  0 - 100 mmol/L   Hemoglobin 15.0  12.0 - 15.0 g/dL   HCT 44.0  36.0 - 46.0 %     EKG:   Date: 12/01/2012  Rate: 119  Rhythm: sinus tachycardia  QRS Axis: normal  Intervals: normal  ST/T Wave abnormalities: normal  Conduction Disutrbances:none  Narrative Interpretation: Sinus tachycardia, possible left atrial enlargement, otherwise normal.  Old EKG Reviewed: unchanged  Assessment:  The primary  encounter diagnosis was Bulimia. Diagnoses of Dehydration, Renal failure, and Sinus tachycardia were also pertinent to this visit.   She has dehydration as evidenced by sinus tachycardia and increasing serum creatinine. She will need IV fluid hydration, electrolyte monitoring, and psychiatric consult.  Plan:   1.  The following meds were prescribed:   New Prescriptions   No medications on file   2.  The patient was transferred to the emergency department via shuttle.  Medical Decision Making:  37 year old with bulimia, has been purging since she got out of hospital 1 week ago and lost 7 lbs.   She was sent here by her psychiatrist due to tachycardia, dizziness, and shortness of breath.  On exam her EKG shows sinus tach with rate 140, iStat 8 shows Cr. 1.6 which has been increasing since last blood work a week ago.  My impression is dehydration.  She will need IV fluids, electrolyte monitoring, and psych consult.      Harden Mo, MD 12/01/12 [phone]

## 2012-12-02 LAB — CBC WITH DIFFERENTIAL/PLATELET
Basophils Relative: 1 % (ref 0–1)
Eosinophils Absolute: 0.4 10*3/uL (ref 0.0–0.7)
Eosinophils Relative: 4 % (ref 0–5)
Hemoglobin: 12.1 g/dL (ref 12.0–15.0)
MCH: 30.9 pg (ref 26.0–34.0)
MCHC: 34.7 g/dL (ref 30.0–36.0)
MCV: 89 fL (ref 78.0–100.0)
Monocytes Relative: 10 % (ref 3–12)
Neutrophils Relative %: 56 % (ref 43–77)

## 2012-12-02 LAB — URINALYSIS W MICROSCOPIC + REFLEX CULTURE
Glucose, UA: NEGATIVE mg/dL
Leukocytes, UA: NEGATIVE
Nitrite: NEGATIVE
Specific Gravity, Urine: 1.04 — ABNORMAL HIGH (ref 1.005–1.030)
pH: 5.5 (ref 5.0–8.0)

## 2012-12-02 LAB — COMPREHENSIVE METABOLIC PANEL
Albumin: 2.8 g/dL — ABNORMAL LOW (ref 3.5–5.2)
Alkaline Phosphatase: 61 U/L (ref 39–117)
BUN: 19 mg/dL (ref 6–23)
Calcium: 7 mg/dL — ABNORMAL LOW (ref 8.4–10.5)
Creatinine, Ser: 1.1 mg/dL (ref 0.50–1.10)
GFR calc Af Amer: 75 mL/min — ABNORMAL LOW (ref >90)
Potassium: 2.9 mEq/L — ABNORMAL LOW (ref 3.5–5.1)
Total Protein: 5.4 g/dL — ABNORMAL LOW (ref 6.0–8.3)

## 2012-12-02 MED ORDER — POTASSIUM CHLORIDE 10 MEQ/100ML IV SOLN
10.0000 meq | Freq: Once | INTRAVENOUS | Status: AC
  Administered 2012-12-02: 10 meq via INTRAVENOUS
  Filled 2012-12-02: qty 100

## 2012-12-02 MED ORDER — POTASSIUM CHLORIDE CRYS ER 20 MEQ PO TBCR
40.0000 meq | EXTENDED_RELEASE_TABLET | Freq: Once | ORAL | Status: AC
  Administered 2012-12-02: 40 meq via ORAL
  Filled 2012-12-02: qty 2

## 2012-12-02 MED ORDER — SODIUM CHLORIDE 0.9 % IV BOLUS (SEPSIS)
1000.0000 mL | Freq: Once | INTRAVENOUS | Status: AC
  Administered 2012-12-02: 1000 mL via INTRAVENOUS

## 2012-12-02 MED ORDER — POTASSIUM CHLORIDE ER 10 MEQ PO TBCR: 10.0000 meq | EXTENDED_RELEASE_TABLET | Freq: Two times a day (BID) | ORAL | Status: DC

## 2012-12-02 NOTE — ED Provider Notes (Signed)
Angiocath insertion Performed by: Osvaldo Shipper  Consent: Verbal consent obtained. Risks and benefits: risks, benefits and alternatives were discussed Time out: Immediately prior to procedure a "time out" was called to verify the correct patient, procedure, equipment, support staff and site/side marked as required.  Preparation: Patient was prepped and draped in the usual sterile fashion.  Vein Location: L AC  Yes Ultrasound Guided  Gauge: 20  Normal blood return and flush without difficulty Patient tolerance: Patient tolerated the procedure well with no immediate complications.     Osvaldo Shipper, MD 12/02/12 [phone]

## 2012-12-04 NOTE — ED Provider Notes (Signed)
Medical screening examination/treatment/procedure(s) were conducted as a shared visit with non-physician practitioner(s) and myself.  I personally evaluated the patient during the encounter  Patient here with hx bulimia, sent here for concerns of dehydration. IV started by me, see other note. Patient agreeable to fluids, labs, recheck once labs back.   Osvaldo Shipper, MD 12/04/12 [phone]

## 2012-12-13 ENCOUNTER — Observation Stay (HOSPITAL_COMMUNITY)
Admission: EM | Admit: 2012-12-13 | Discharge: 2012-12-14 | Disposition: A | Discharge status: Home / Self Care | Payer: Medicare Other | Attending: Family Medicine | Admitting: Family Medicine

## 2012-12-13 ENCOUNTER — Encounter (HOSPITAL_COMMUNITY): Payer: Self-pay | Admitting: *Deleted

## 2012-12-13 ENCOUNTER — Emergency Department (HOSPITAL_COMMUNITY): Payer: Medicare Other

## 2012-12-13 DIAGNOSIS — I498 Other specified cardiac arrhythmias: Secondary | ICD-10-CM

## 2012-12-13 DIAGNOSIS — F3289 Other specified depressive episodes: Secondary | ICD-10-CM | POA: Insufficient documentation

## 2012-12-13 DIAGNOSIS — F509 Eating disorder, unspecified: Secondary | ICD-10-CM

## 2012-12-13 DIAGNOSIS — H9190 Unspecified hearing loss, unspecified ear: Secondary | ICD-10-CM | POA: Insufficient documentation

## 2012-12-13 DIAGNOSIS — R079 Chest pain, unspecified: Principal | ICD-10-CM | POA: Insufficient documentation

## 2012-12-13 DIAGNOSIS — R000 Tachycardia, unspecified: Secondary | ICD-10-CM

## 2012-12-13 DIAGNOSIS — R0602 Shortness of breath: Secondary | ICD-10-CM | POA: Insufficient documentation

## 2012-12-13 DIAGNOSIS — E785 Hyperlipidemia, unspecified: Secondary | ICD-10-CM

## 2012-12-13 DIAGNOSIS — E860 Dehydration: Secondary | ICD-10-CM

## 2012-12-13 DIAGNOSIS — F411 Generalized anxiety disorder: Secondary | ICD-10-CM | POA: Insufficient documentation

## 2012-12-13 DIAGNOSIS — R632 Polyphagia: Secondary | ICD-10-CM | POA: Insufficient documentation

## 2012-12-13 DIAGNOSIS — F329 Major depressive disorder, single episode, unspecified: Secondary | ICD-10-CM | POA: Insufficient documentation

## 2012-12-13 DIAGNOSIS — I129 Hypertensive chronic kidney disease with stage 1 through stage 4 chronic kidney disease, or unspecified chronic kidney disease: Secondary | ICD-10-CM | POA: Insufficient documentation

## 2012-12-13 DIAGNOSIS — N183 Chronic kidney disease, stage 3 unspecified: Secondary | ICD-10-CM | POA: Insufficient documentation

## 2012-12-13 DIAGNOSIS — E039 Hypothyroidism, unspecified: Secondary | ICD-10-CM | POA: Insufficient documentation

## 2012-12-13 DIAGNOSIS — F603 Borderline personality disorder: Secondary | ICD-10-CM | POA: Insufficient documentation

## 2012-12-13 DIAGNOSIS — Z79899 Other long term (current) drug therapy: Secondary | ICD-10-CM | POA: Insufficient documentation

## 2012-12-13 LAB — COMPREHENSIVE METABOLIC PANEL
Albumin: 3.6 g/dL (ref 3.5–5.2)
Alkaline Phosphatase: 83 U/L (ref 39–117)
BUN: 16 mg/dL (ref 6–23)
Creatinine, Ser: 1.27 mg/dL — ABNORMAL HIGH (ref 0.50–1.10)
GFR calc Af Amer: 63 mL/min — ABNORMAL LOW (ref >90)
Glucose, Bld: 132 mg/dL — ABNORMAL HIGH (ref 70–99)
Total Protein: 6.9 g/dL (ref 6.0–8.3)

## 2012-12-13 LAB — CBC WITH DIFFERENTIAL/PLATELET
Basophils Absolute: 0.1 10*3/uL (ref 0.0–0.1)
Eosinophils Relative: 3 % (ref 0–5)
HCT: 39.9 % (ref 36.0–46.0)
Lymphocytes Relative: 26 % (ref 12–46)
Lymphs Abs: 3 10*3/uL (ref 0.7–4.0)
MCV: 88.7 fL (ref 78.0–100.0)
Monocytes Absolute: 1.1 10*3/uL — ABNORMAL HIGH (ref 0.1–1.0)
Neutro Abs: 7.3 10*3/uL (ref 1.7–7.7)
Platelets: 259 10*3/uL (ref 150–400)
RBC: 4.5 MIL/uL (ref 3.87–5.11)
RDW: 13.5 % (ref 11.5–15.5)
WBC: 11.7 10*3/uL — ABNORMAL HIGH (ref 4.0–10.5)

## 2012-12-13 LAB — LACTIC ACID, PLASMA: Lactic Acid, Venous: 1.8 mmol/L (ref 0.5–2.2)

## 2012-12-13 LAB — POCT I-STAT TROPONIN I: Troponin i, poc: 0 ng/mL (ref 0.00–0.08)

## 2012-12-13 LAB — D-DIMER, QUANTITATIVE: D-Dimer, Quant: 0.35 ug/mL-FEU (ref 0.00–0.48)

## 2012-12-13 MED ORDER — SODIUM CHLORIDE 0.9 % IV BOLUS (SEPSIS)
1000.0000 mL | Freq: Once | INTRAVENOUS | Status: AC
  Administered 2012-12-13: 1000 mL via INTRAVENOUS

## 2012-12-13 MED ORDER — ONDANSETRON HCL 4 MG/2ML IJ SOLN
4.0000 mg | Freq: Once | INTRAMUSCULAR | Status: AC
  Administered 2012-12-13: 4 mg via INTRAVENOUS
  Filled 2012-12-13: qty 2

## 2012-12-13 NOTE — ED Notes (Signed)
1910  Received report from off going RN and introduced self to the pt.    1925  Pt was ambulated to the restroom and on the way back to the room she was a little dizzy.  Pt was able to ambulate back to the bed

## 2012-12-13 NOTE — ED Notes (Signed)
Pt states that her bulimia is getting worse.  Pt is here with chest pain, pressure and racing heart rate

## 2012-12-13 NOTE — ED Provider Notes (Signed)
CSN: [id number]     Arrival date & time 12/13/12  1627 History     First MD Initiated Contact with Patient 12/13/12 1709     Chief Complaint  Patient presents with  . Chest Pain  . Tachycardia   (Consider location/radiation/quality/duration/timing/severity/associated sxs/prior Treatment) HPI 37 year old female with a history of bulimia, depression, pulmonary embolism, inappropriate sinus tachycardia seen by Cardiology and recently placed on metoprolol by PCP, chronic renal insufficiency, deafness presents with concern of worsening bulimia, dehydration, palpitations, and fatigue. Patient notes that she's had an eating disorder for 20 years, alternating between anorexia and bulimia, however notes that it has been worse lately than it has ever been.  Reports she's been taking laxatives and has been having countless amounts of diarrhea per day. Reports that she was hospitalized at [facility] twice in the last month for dehydration, and was sent here for from urgent care for concern of dehydration as well. Reports her primary care physician recently prescribed 50 mg of metoprolol for patient to take once daily, however she didn't did not have the energy to pick up the prescription until yesterday. Reports yesterday she was at the pharmacy and noted her heart rate to be 144, and began taking the metoprolol. Reports that she continues to have a feeling of palpitations and chest pressure that has been constant since yesterday.  Reports this does not feel like her pulmonary embolus. Also reports she was on anticoagulation for one year following her pulmonary embolism and no longer to anticoagulation.  Patient denies any suicidal or homicidal ideations. Doesn't she occasionally hears voices are not hers, however there is no change in this and reports she discusses this with her psychiatrist.  Past Medical History  Diagnosis Date  . Eating disorder   . Anxiety   . Depression   . OCD (obsessive compulsive  disorder)   . Pulmonary embolism   . Seizures   . Anemia   . Tachycardia   . Hypertension   . Chronic renal insufficiency   . 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 Endometriosis   . GERD (gastroesophageal reflux disease)   . Migraine   . Deafness    Past Surgical History  Procedure Laterality Date  . Cochlear implant    . Abdominal hysterectomy      'except for right ovary'  . Eye surgery Left 1980  . Eye surgery Right 1990  . Breast lumpectomy Left 2000    x2  . Breast lumpectomy Right 2002    benign  . Laparoscopic abdominal exploration  2001 and 2002    for endometriosis   Family History  Problem Relation Age of Onset  . Cancer Father   . Hyperlipidemia Father   . Osteoarthritis Mother   . Hyperlipidemia Mother    History  Substance Use Topics  . Smoking status: Never Smoker   . Smokeless tobacco: Never Used  . Alcohol Use: No   OB History   Grav Para Term Preterm Abortions TAB SAB Ect Mult Living                 Review of Systems  Constitutional: Negative for fever.  HENT: Negative for sore throat and neck pain.   Eyes: Negative for visual disturbance.  Respiratory: Positive for shortness of breath. Negative for cough.   Cardiovascular: Positive for chest pain and palpitations. Negative for leg swelling.  Gastrointestinal: Positive for nausea and vomiting. Negative for abdominal pain.  Genitourinary: Negative for  difficulty urinating.  Musculoskeletal: Negative for back pain.  Skin: Negative for rash.  Neurological: Negative for syncope and headaches.  Psychiatric/Behavioral: Positive for behavioral problems. The patient is nervous/anxious.     Allergies  Iodinated diagnostic agents; Abilify; Dihydroergotamine; Doxycycline; Entex lq; Erythromycin; Lactose intolerance (gi); Nsaids; and Tape  Home Medications   No current outpatient prescriptions on file. BP 122/83  Pulse 102  Temp(Src) 98.1 F (36.7 C) (Oral)  Resp 16  Ht 5\' 8"   (1.727 m)  Wt 152 lb 14.4 oz (69.355 kg)  BMI 23.25 kg/m2  SpO2 100% Physical Exam  Nursing note and vitals reviewed. Constitutional: She is oriented to person, place, and time. She appears well-developed and well-nourished. No distress.  HENT:  Head: Normocephalic and atraumatic.  Mouth/Throat: No oropharyngeal exudate.  Eyes: Conjunctivae and EOM are normal. Pupils are equal, round, and reactive to light.  Neck: Normal range of motion.  Cardiovascular: Normal rate, regular rhythm, normal heart sounds and intact distal pulses.  Exam reveals no gallop and no friction rub.   No murmur heard. Pulmonary/Chest: Effort normal and breath sounds normal. No respiratory distress. She has no wheezes. She has no rales.  Abdominal: Soft. She exhibits no distension. There is no tenderness. There is no guarding.  Musculoskeletal: She exhibits no edema and no tenderness.  Neurological: She is alert and oriented to person, place, and time.  Skin: Skin is warm and dry. No rash noted. She is not diaphoretic. No erythema.    ED Course   Procedures (including critical care time)  Labs Reviewed  COMPREHENSIVE METABOLIC PANEL - Abnormal; Notable for the following:    Sodium 134 (*)    CO2 15 (*)    Glucose, Bld 132 (*)    Creatinine, Ser 1.27 (*)    Total Bilirubin 0.2 (*)    GFR calc non Af Amer 54 (*)    GFR calc Af Amer 63 (*)    All other components within normal limits  CBC WITH DIFFERENTIAL - Abnormal; Notable for the following:    WBC 11.7 (*)    MCHC 36.3 (*)    Monocytes Absolute 1.1 (*)    All other components within normal limits  BASIC METABOLIC PANEL - Abnormal; Notable for the following:    CO2 17 (*)    Glucose, Bld 100 (*)    Creatinine, Ser 1.13 (*)    Calcium 7.7 (*)    GFR calc non Af Amer 63 (*)    GFR calc Af Amer 73 (*)    All other components within normal limits  D-DIMER, QUANTITATIVE  LACTIC ACID, PLASMA  TROPONIN I  URINALYSIS, ROUTINE W REFLEX MICROSCOPIC   TROPONIN I  CBC  CREATININE, SERUM  CBC  HEMOGLOBIN A1C  POCT I-STAT TROPONIN I   Dg Chest 2 View  12/13/2012   *RADIOLOGY REPORT*  Clinical Data: Chest pain and tachycardia.  CHEST - 2 VIEW  Comparison: 11/16/2012 and 08/26/2012  Findings: Lungs are adequately inflated without consolidation or effusion.  The cardiomediastinal silhouette and remainder of the exam is unchanged.  IMPRESSION: No acute cardiopulmonary disease.   Original Report Authenticated By: Marin Olp, M.D.   Date: 12/13/2012  Rate: 130  Rhythm: sinus tachycardia  QRS Axis: normal  Intervals: normal  ST/T Wave abnormalities: nonspecific T wave changes  Conduction Disutrbances:none  Narrative Interpretation:   Old EKG Reviewed: unchanged, T wave abnormality inferior leads seen in previous EKGs   1. Dehydration   2. Eating disorder, unspecified  MDM  36 year old female with a history of bulimia, depression, pulmonary embolism, inappropriate sinus tachycardia seen by Cardiology and recently placed on metoprolol by PCP, chronic renal insufficiency, deafness presents with concern of worsening bulimia, dehydration, palpitations, and fatigue.  Patient tachycardic on arrival to the emergency department with heart rate in the 130s. Patient has seen cardiology and has been diagnosed with inappropriate sinus tachycardia, and her primary care physician prescribed metoprolol which she's only taken one day of this time. Tachycardia may also be secondary to dehydration in setting of patient's self-induced vomiting and taking laxatives resulting in diarrhea. Inject given patient's chest pain, and was negative and given her duration of patient's symptoms have low suspicion for ACS. D-dimer was checked and despite patient's history of pulmonary embolism however suspicion that her chest pressure for the last 2 days he is a pulmonary embolism physicians not to get, not tachycardic, and reports that her symptoms are different and feel  more like palpitations. Chest x-ray shows no acute cardiopulmonary disease. CBC shows mild leukocytosis of 11.7. CMP was significant for a bicarbonate of 15 and mild hyponatremia of 134. Creatinine is near where patient has been previously at 1.27.  Acidosis is likely secondary to dehydration. Lactic acid and urinalysis ordered and pending. Patient to be admitted to the family medicine service for observation for dehydration in setting of bulimia.  Alvino Chapel, MD 12/14/12 [phone]

## 2012-12-13 NOTE — ED Provider Notes (Signed)
I saw and evaluated the patient, reviewed the resident's note and I agree with the findings including ECG interpretation and plan.  Chronic daily self-induced vomiting from bulimia, chronic tachycardia rate 100-120 at baseline, denies suicidal homicidal ideation or psychosis, worse vomiting couple days, clinically suspect dehydration.  Babette Relic, MD 12/15/12 2049

## 2012-12-13 NOTE — H&P (Signed)
[facility] Admission History and Physical Service Pager: [phone]  Patient name: Brandi Love Medical record number: [id number] Date of birth: 01/20/1976 Age: 37 y.o. Gender: female  Primary Care Provider: Pcp Not In System Consultants: none Code Status: Limited code, everything but cardioversion/shock  Chief Complaint: weakness, palpitations, SOB  Assessment and Plan: Brandi Love is a 37 y.o. year old female presenting with dehydration, weakness, palpitations. PMH is significant for long history of restrictive bulimia, CKD stage 3, inappropriate sinus tachycardia, hypothyroidism, borderline personality disorder, deafness.  # Bulimia / Dehydration: >20 year history of bulimia, restrictive, laxative abuse. 3L bolus while in ED improving symptoms. BPs stable around 100-110s/70-90s. BMET in ED: Bicarb 15, AG 17. These abnormalities likely related to significant recent laxative abuse, less likely from ischemia in gut given normal range lactic acid. Suspect bulimia has caused dehydration and is contributing to patient feeling poorly recently. - Repeat BMET on transfer to floor - Continue fluids NS 125cc/hr, encourage po fluids - continue to follow symptom improvement - consider psych consult for bulimia eval for inpatient treatment  # Palpitations / Tachycardia / chest pain: negative troponin x1. Tachy at baseline, dx'd with inappropriate sinus tachycardia, last seen cardiologist 3 months ago. Metoprolol XL recently started by PCP. Chest pain potentially related to frequent vomiting inducing MSK cause especially since patient with reproducible pain. Patient with appointment with Dr. Einar Love on 12/24/12 - Follow serial troponins x 3 - Telemetry - Repeat EKG in AM - Continue home metoprolol XL  # Shortness of breath: Normal CXR, d-dimer negative, SpO2 100% on room air in ED. Potentially related to anxiety state, though must consider cardiac cause. - Continuous pulse  oximetry - follow respiratory status  # ?Schizophrenia, stable: Psych issues followed by ACT team at [facility]. Continue seroquel, HOLD haldol PRN # Depression/Anxiety, stable: Continue home remeron, pristiq, effexor, ativan # Borderline personality disorder # CKD, stable: Creatinine 1.27, near baseline. # Dyslipidemia: Continue home pravachol # Hypothyroidism: Last TSH 0.259. Free T3 and T4 both wnl. Continue home synthroid # Hearing loss: has cochlear implants. Cannot have MRI.  FEN/GI: 3 L NS bolus, NS 125cc/hr Prophylaxis: heparin sq  Disposition: admit to telemetry  History of Present Illness: Brandi Love is a 37 y.o. year old female presenting with weakness, dehydration, palpitations. PMHx significant for restrictive bulimia, CKD stage 3, inappropriate sinus tachycardia, hypothyroidism, borderline personality disorder. For the past month she has had worsening bulimia, recent admissions at [facility] twice for dehydration. She has felt constantly weak, exhausted, has palpitations, and gets SOB when walking up stairs. She also feels dizzy and nauseated. She has been vomiting 1-3 times per day, takes 15-20 laxatives per day and has more BM in a day "than I can count". Not peeing much, feels better since fluids started while in ED. She also complains of chest pain that she describes as a pressure/squeezing and then describes as sharp, left side of chest, worse with movement and gets better with 30-60 mins of rest. She has associated sweating and SOB. She was recently started on metroprolol XL, of which she has only taken 2 doses.  For her bulimia she sees a therapist twice a week and has recently been trying to get inpatient treatment. There are apparently no facilities that will help her so she has been looking out of state at [place] (denied due to co morbidities), [place], and [place].  Review Of Systems: Per HPI with the following additions: none Otherwise 12 point review of  systems was  performed and was unremarkable.  Patient Active Problem List   Diagnosis Date Noted  . Eating disorder, unspecified 11/23/2012  . Unspecified hypothyroidism 11/23/2012  . Dyslipidemia 11/23/2012  . Sinus tachycardia 11/23/2012  . CKD (chronic kidney disease) stage 3, GFR 30-59 ml/min 11/18/2012  . Protein-calorie malnutrition, severe 11/13/2012  . Abdominal pain, epigastric 11/12/2012  . Dehydration 11/12/2012  . Acute on chronic renal failure 11/12/2012  . Leukocytosis, unspecified 11/12/2012  . Hyponatremia 11/12/2012  . Borderline personality disorder 11/12/2012   Past Medical History: Past Medical History  Diagnosis Date  . Eating disorder   . Anxiety   . Depression   . OCD (obsessive compulsive disorder)   . Pulmonary embolism   . Seizures   . Anemia   . Tachycardia   . Hypertension   . Chronic renal insufficiency   . 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 Endometriosis   . GERD (gastroesophageal reflux disease)   . Migraine   . Deafness    Past Surgical History: Past Surgical History  Procedure Laterality Date  . Cochlear implant    . Abdominal hysterectomy      'except for right ovary'  . Eye surgery Left 1980  . Eye surgery Right 1990  . Breast lumpectomy Left 2000    x2  . Breast lumpectomy Right 2002    benign  . Laparoscopic abdominal exploration  2001 and 2002    for endometriosis   Social History: History  Substance Use Topics  . Smoking status: Never Smoker   . Smokeless tobacco: Never Used  . Alcohol Use: No   Additional social history:  Please also refer to relevant sections of EMR.  Family History: Family History  Problem Relation Age of Onset  . Cancer Father   . Hyperlipidemia Father   . Osteoarthritis Mother   . Hyperlipidemia Mother    Allergies and Medications: Allergies  Allergen Reactions  . Iodinated Diagnostic Agents Anaphylaxis    'cant breathe'  . Abilify [Aripiprazole] Nausea And Vomiting  .  Dihydroergotamine Nausea And Vomiting  . Doxycycline Nausea And Vomiting  . Entex Lq [Phenylephrine-Guaifenesin] Other (See Comments)    unknown  . Erythromycin Nausea And Vomiting  . Lactose Intolerance (Gi) Nausea And Vomiting  . Nsaids Other (See Comments)    Unable to ttake due to renal insufficency  . Tape Other (See Comments)    Redness, can use paper tape   No current facility-administered medications on file prior to encounter.   Current Outpatient Prescriptions on File Prior to Encounter  Medication Sig Dispense Refill  . bisacodyl (DULCOLAX) 5 MG EC tablet Take 30-35 mg by mouth daily. 7 tablets in the am and 6 tablets at noon and 7 tablets at bedtime      . desvenlafaxine (PRISTIQ) 50 MG 24 hr tablet Take 50 mg by mouth daily.      . diphenhydrAMINE (BENADRYL) 25 mg capsule Take 25 mg by mouth every 6 (six) hours as needed (to alleviate cramps and twitching when taking haldol).      Marland Kitchen docusate sodium (COLACE) 100 MG capsule Take 200 mg by mouth 2 (two) times daily.      . haloperidol (HALDOL) 5 MG tablet Take 5 mg by mouth every 12 (twelve) hours as needed (for anxiety or going through crisis events).      . lamoTRIgine (LAMICTAL) 25 MG tablet Take 50 mg by mouth every morning.      Marland Kitchen  levothyroxine (SYNTHROID, LEVOTHROID) 175 MCG tablet Take 175 mcg by mouth daily.      Marland Kitchen LORazepam (ATIVAN) 0.5 MG tablet Take 0.5 mg by mouth 2 (two) times daily. 12 p and 8 p      . magnesium oxide (MAG-OX) 400 MG tablet Take 200 mg by mouth daily.      . Melatonin 3 MG TABS Take 3 mg by mouth at bedtime.       . methocarbamol (ROBAXIN) 750 MG tablet Take 750 mg by mouth 3 (three) times daily.      . mirtazapine (REMERON) 30 MG tablet Take 60 mg by mouth at bedtime.       . Multiple Vitamin (MULTIVITAMIN WITH MINERALS) TABS Take 1 tablet by mouth daily.      Marland Kitchen omega-3 acid ethyl esters (LOVAZA) 1 G capsule Take 2 g by mouth 2 (two) times daily.      Marland Kitchen omeprazole (PRILOSEC) 20 MG capsule Take 20  mg by mouth daily.      . ondansetron (ZOFRAN) 4 MG tablet Take 4 mg by mouth every 8 (eight) hours as needed for nausea.      . potassium chloride (K-DUR) 10 MEQ tablet Take 1 tablet (10 mEq total) by mouth 2 (two) times daily.  20 tablet  0  . pravastatin (PRAVACHOL) 20 MG tablet Take 20 mg by mouth every evening.      . pregabalin (LYRICA) 25 MG capsule Take 25 mg by mouth 4 (four) times daily.       . QUEtiapine (SEROQUEL) 100 MG tablet Take 200 mg by mouth 2 (two) times daily. Takes at 8am, 12pm      . QUEtiapine (SEROQUEL) 400 MG tablet Take 400 mg by mouth at bedtime.      . SUMAtriptan (IMITREX) 100 MG tablet Take 100 mg by mouth every 2 (two) hours as needed for migraine.      . Vitamin D, Ergocalciferol, (DRISDOL) 50000 UNITS CAPS Take 50,000 Units by mouth every 7 (seven) days. On Sunday        Objective: BP 107/81  Pulse 107  Temp(Src) 98.7 F (37.1 C) (Oral)  Resp 14  SpO2 100% Exam: General: NAD, laying in bed on side, answers questions appropriately HEENT: sclera anicteric, PERRL, EOMI, oral mucosa dry, cochlear implants in place Cardiovascular: Tachycardic, normal S1/S2, no M/R/G. Tenderness to palpation on left side of chest Respiratory: CTAB, effort normal Abdomen: soft, tenderness in epigastrium Extremities: no edema, 2+ DP bilaterally Skin: warm and dry Neuro: A&Ox3, grossly normal  Labs and Imaging: CBC BMET   Recent Labs Lab 12/13/12 1745  WBC 11.7*  HGB 14.5  HCT 39.9  PLT 259    Recent Labs Lab 12/13/12 1802  NA 134*  K 4.0  CL 102  CO2 15*  BUN 16  CREATININE 1.27*  GLUCOSE 132*  CALCIUM 9.5     Results for orders placed during the hospital encounter of 12/13/12 (from the past 24 hour(s))  POCT I-STAT TROPONIN I     Status: None   Collection Time    12/13/12  5:40 PM      Result Value Range   Troponin i, poc 0.00  0.00 - 0.08 ng/mL   Comment 3           CBC WITH DIFFERENTIAL     Status: Abnormal   Collection Time    12/13/12  5:45  PM      Result Value Range   WBC 11.7 (*)  4.0 - 10.5 K/uL   RBC 4.50  3.87 - 5.11 MIL/uL   Hemoglobin 14.5  12.0 - 15.0 g/dL   HCT 39.9  36.0 - 46.0 %   MCV 88.7  78.0 - 100.0 fL   MCH 32.2  26.0 - 34.0 pg   MCHC 36.3 (*) 30.0 - 36.0 g/dL   RDW 13.5  11.5 - 15.5 %   Platelets 259  150 - 400 K/uL   Neutrophils Relative % 62  43 - 77 %   Neutro Abs 7.3  1.7 - 7.7 K/uL   Lymphocytes Relative 26  12 - 46 %   Lymphs Abs 3.0  0.7 - 4.0 K/uL   Monocytes Relative 9  3 - 12 %   Monocytes Absolute 1.1 (*) 0.1 - 1.0 K/uL   Eosinophils Relative 3  0 - 5 %   Eosinophils Absolute 0.3  0.0 - 0.7 K/uL   Basophils Relative 1  0 - 1 %   Basophils Absolute 0.1  0.0 - 0.1 K/uL  COMPREHENSIVE METABOLIC PANEL     Status: Abnormal   Collection Time    12/13/12  6:02 PM      Result Value Range   Sodium 134 (*) 135 - 145 mEq/L   Potassium 4.0  3.5 - 5.1 mEq/L   Chloride 102  96 - 112 mEq/L   CO2 15 (*) 19 - 32 mEq/L   Glucose, Bld 132 (*) 70 - 99 mg/dL   BUN 16  6 - 23 mg/dL   Creatinine, Ser 1.27 (*) 0.50 - 1.10 mg/dL   Calcium 9.5  8.4 - 10.5 mg/dL   Total Protein 6.9  6.0 - 8.3 g/dL   Albumin 3.6  3.5 - 5.2 g/dL   AST 24  0 - 37 U/L   ALT 27  0 - 35 U/L   Alkaline Phosphatase 83  39 - 117 U/L   Total Bilirubin 0.2 (*) 0.3 - 1.2 mg/dL   GFR calc non Af Amer 54 (*) >90 mL/min   GFR calc Af Amer 63 (*) >90 mL/min  D-DIMER, QUANTITATIVE     Status: None   Collection Time    12/13/12  6:17 PM      Result Value Range   D-Dimer, Quant 0.35  0.00 - 0.48 ug/mL-FEU  LACTIC ACID, PLASMA     Status: None   Collection Time    12/13/12 10:21 PM      Result Value Range   Lactic Acid, Venous 1.8  0.5 - 2.2 mmol/L   Dg Chest 2 View  12/13/2012   *RADIOLOGY REPORT*  Clinical Data: Chest pain and tachycardia.  CHEST - 2 VIEW  Comparison: 11/16/2012 and 08/26/2012  Findings: Lungs are adequately inflated without consolidation or effusion.  The cardiomediastinal silhouette and remainder of the exam is  unchanged.  IMPRESSION: No acute cardiopulmonary disease.   Original Report Authenticated By: Marin Olp, M.D.     Tawanna Sat, MD 12/13/2012, 10:26 PM PGY-1, [place] Intern pager: [phone], text pages welcome  PGY-2 Addendum: I have seen and examined this patient with the intern and agree with the above note with my additions outlined in red.  Tommi Rumps, MD PGY-2 [place]

## 2012-12-14 DIAGNOSIS — E785 Hyperlipidemia, unspecified: Secondary | ICD-10-CM

## 2012-12-14 DIAGNOSIS — F603 Borderline personality disorder: Secondary | ICD-10-CM

## 2012-12-14 DIAGNOSIS — E039 Hypothyroidism, unspecified: Secondary | ICD-10-CM

## 2012-12-14 LAB — BASIC METABOLIC PANEL
CO2: 17 mEq/L — ABNORMAL LOW (ref 19–32)
Chloride: 108 mEq/L (ref 96–112)
GFR calc Af Amer: 73 mL/min — ABNORMAL LOW (ref >90)
Sodium: 135 mEq/L (ref 135–145)

## 2012-12-14 LAB — URINALYSIS, ROUTINE W REFLEX MICROSCOPIC
Bilirubin Urine: NEGATIVE
Ketones, ur: NEGATIVE mg/dL
Leukocytes, UA: NEGATIVE
Nitrite: NEGATIVE
Protein, ur: NEGATIVE mg/dL
Urobilinogen, UA: 0.2 mg/dL (ref 0.0–1.0)

## 2012-12-14 LAB — CREATININE, SERUM: GFR calc Af Amer: 72 mL/min — ABNORMAL LOW (ref >90)

## 2012-12-14 LAB — CBC
HCT: 35.4 % — ABNORMAL LOW (ref 36.0–46.0)
MCV: 90.1 fL (ref 78.0–100.0)
Platelets: 200 10*3/uL (ref 150–400)
RBC: 3.93 MIL/uL (ref 3.87–5.11)
WBC: 10.2 10*3/uL (ref 4.0–10.5)

## 2012-12-14 LAB — TROPONIN I: Troponin I: <0.3 ng/mL (ref <0.30)

## 2012-12-14 LAB — TSH: TSH: 0.305 u[IU]/mL — ABNORMAL LOW (ref 0.350–4.500)

## 2012-12-14 LAB — HEMOGLOBIN A1C: Hgb A1c MFr Bld: 5.6 % (ref <5.7)

## 2012-12-14 MED ORDER — SODIUM CHLORIDE 0.9 % IV SOLN
INTRAVENOUS | Status: DC
  Administered 2012-12-14: 02:00:00 via INTRAVENOUS

## 2012-12-14 MED ORDER — PREGABALIN 25 MG PO CAPS
25.0000 mg | ORAL_CAPSULE | Freq: Four times a day (QID) | ORAL | Status: DC
  Administered 2012-12-14: 25 mg via ORAL
  Filled 2012-12-14: qty 1

## 2012-12-14 MED ORDER — LEVOTHYROXINE SODIUM 175 MCG PO TABS: 175.0000 ug | ORAL_TABLET | Freq: Every day | ORAL | Status: DC

## 2012-12-14 MED ORDER — QUETIAPINE FUMARATE 200 MG PO TABS
200.0000 mg | ORAL_TABLET | Freq: Two times a day (BID) | ORAL | Status: DC
  Administered 2012-12-14: 200 mg via ORAL
  Filled 2012-12-14 (×2): qty 1

## 2012-12-14 MED ORDER — METOPROLOL SUCCINATE ER 50 MG PO TB24
50.0000 mg | ORAL_TABLET | Freq: Every day | ORAL | Status: DC
  Administered 2012-12-14: 50 mg via ORAL
  Filled 2012-12-14: qty 1

## 2012-12-14 MED ORDER — SUMATRIPTAN SUCCINATE 100 MG PO TABS
100.0000 mg | ORAL_TABLET | ORAL | Status: DC | PRN
  Filled 2012-12-14: qty 1

## 2012-12-14 MED ORDER — PANTOPRAZOLE SODIUM 40 MG PO TBEC
40.0000 mg | DELAYED_RELEASE_TABLET | Freq: Every day | ORAL | Status: DC
  Administered 2012-12-14: 40 mg via ORAL
  Filled 2012-12-14: qty 1

## 2012-12-14 MED ORDER — MAGNESIUM OXIDE 400 (241.3 MG) MG PO TABS
200.0000 mg | ORAL_TABLET | Freq: Every day | ORAL | Status: DC
  Administered 2012-12-14: 200 mg via ORAL
  Filled 2012-12-14: qty 0.5

## 2012-12-14 MED ORDER — LAMOTRIGINE 25 MG PO TABS
50.0000 mg | ORAL_TABLET | Freq: Every morning | ORAL | Status: DC
  Administered 2012-12-14: 50 mg via ORAL
  Filled 2012-12-14: qty 2

## 2012-12-14 MED ORDER — VENLAFAXINE HCL ER 75 MG PO CP24
75.0000 mg | ORAL_CAPSULE | Freq: Every day | ORAL | Status: DC
  Administered 2012-12-14: 75 mg via ORAL
  Filled 2012-12-14 (×2): qty 1

## 2012-12-14 MED ORDER — OMEGA-3-ACID ETHYL ESTERS 1 G PO CAPS
2.0000 g | ORAL_CAPSULE | Freq: Two times a day (BID) | ORAL | Status: DC
  Administered 2012-12-14: 2 g via ORAL
  Filled 2012-12-14 (×2): qty 2

## 2012-12-14 MED ORDER — METHOCARBAMOL 750 MG PO TABS
750.0000 mg | ORAL_TABLET | Freq: Three times a day (TID) | ORAL | Status: DC
  Administered 2012-12-14: 750 mg via ORAL
  Filled 2012-12-14 (×3): qty 1

## 2012-12-14 MED ORDER — VITAMIN D (ERGOCALCIFEROL) 1.25 MG (50000 UNIT) PO CAPS: 50000.0000 [IU] | ORAL_CAPSULE | ORAL | Status: DC

## 2012-12-14 MED ORDER — LEVOTHYROXINE SODIUM 175 MCG PO TABS
175.0000 ug | ORAL_TABLET | Freq: Every day | ORAL | Status: DC
  Administered 2012-12-14: 175 ug via ORAL
  Filled 2012-12-14 (×2): qty 1

## 2012-12-14 MED ORDER — MAGNESIUM OXIDE 400 MG PO TABS: 200.0000 mg | ORAL_TABLET | Freq: Every day | ORAL | Status: DC

## 2012-12-14 MED ORDER — ADULT MULTIVITAMIN W/MINERALS CH
1.0000 | ORAL_TABLET | Freq: Every day | ORAL | Status: DC
  Administered 2012-12-14: 1 via ORAL
  Filled 2012-12-14: qty 1

## 2012-12-14 MED ORDER — HEPARIN SODIUM (PORCINE) 5000 UNIT/ML IJ SOLN
5000.0000 [IU] | Freq: Three times a day (TID) | INTRAMUSCULAR | Status: DC
  Administered 2012-12-14: 5000 [IU] via SUBCUTANEOUS
  Filled 2012-12-14 (×4): qty 1

## 2012-12-14 MED ORDER — SIMVASTATIN 10 MG PO TABS
10.0000 mg | ORAL_TABLET | Freq: Every day | ORAL | Status: DC
  Filled 2012-12-14: qty 1

## 2012-12-14 MED ORDER — SODIUM CHLORIDE 0.9 % IJ SOLN
3.0000 mL | Freq: Two times a day (BID) | INTRAMUSCULAR | Status: DC
  Administered 2012-12-14 (×2): 3 mL via INTRAVENOUS

## 2012-12-14 MED ORDER — ONDANSETRON HCL 4 MG PO TABS: 4.0000 mg | ORAL_TABLET | Freq: Three times a day (TID) | ORAL | Status: DC | PRN

## 2012-12-14 MED ORDER — MELATONIN 3 MG PO TABS
3.0000 mg | ORAL_TABLET | Freq: Every day | ORAL | Status: DC
Start: 2012-12-14 — End: 2012-12-14

## 2012-12-14 MED ORDER — DIPHENHYDRAMINE HCL 25 MG PO CAPS: 25.0000 mg | ORAL_CAPSULE | Freq: Four times a day (QID) | ORAL | Status: DC | PRN

## 2012-12-14 MED ORDER — POTASSIUM CHLORIDE ER 10 MEQ PO TBCR
10.0000 meq | EXTENDED_RELEASE_TABLET | Freq: Two times a day (BID) | ORAL | Status: DC
  Administered 2012-12-14: 10 meq via ORAL
  Filled 2012-12-14 (×3): qty 1

## 2012-12-14 MED ORDER — LORAZEPAM 0.5 MG PO TABS
0.5000 mg | ORAL_TABLET | Freq: Two times a day (BID) | ORAL | Status: DC
  Administered 2012-12-14: 0.5 mg via ORAL
  Filled 2012-12-14: qty 1

## 2012-12-14 MED ORDER — MIRTAZAPINE 30 MG PO TABS
60.0000 mg | ORAL_TABLET | Freq: Every day | ORAL | Status: DC
  Filled 2012-12-14 (×2): qty 2

## 2012-12-14 NOTE — Progress Notes (Signed)
Family Medicine Teaching Service Daily Progress Note Intern Pager: [phone]  Patient name: Brandi Love Medical record number: [id number] Date of birth: 11-09-1975 Age: 37 y.o. Gender: female  Primary Care Provider: Pcp Not In System Consultants: none Code Status: Limited code, everything but cardioversion/shock   Pt Overview and Major Events to Date:   Assessment and Plan: Brandi Love is a 37 y.o. year old female presenting with dehydration, weakness, palpitations. PMH is significant for long history of restrictive bulimia, CKD stage 3, inappropriate sinus tachycardia, hypothyroidism, borderline personality disorder, deafness.   # Bulimia / Dehydration: >20 year history of bulimia, restrictive, laxative abuse. 3L bolus while in ED improving symptoms. BPs stable around 100-110s/70-90s. BMET in ED: Bicarb 15, AG 17. These abnormalities likely related to significant recent laxative abuse, less likely from ischemia in gut given normal range lactic acid. Suspect bulimia has caused dehydration and is contributing to patient feeling poorly recently.  - Continue fluids NS 125cc/hr, encourage po fluids  - continue to follow symptom improvement  - consider psych consult for bulimia eval for inpatient treatment  [ ]  f/u orthostatics   # Palpitations / Tachycardia / chest pain: negative troponin x1. Tachy at baseline, dx'd with inappropriate sinus tachycardia, last seen cardiologist 3 months ago. Metoprolol XL recently started by PCP. Chest pain potentially related to frequent vomiting inducing MSK cause especially since patient with reproducible pain. Patient with appointment with Dr. Einar Gip on 12/24/12  - troponin (-) x 1;POC trop (-); Follow serial troponins x 2  - Telemetry  - Repeat EKG: 8/25: sinus tachycardia   - Continue home metoprolol XL  - Pregnancy test: negative  - D-dimer: 0.35 [ ]  f/u TSH, Hgb A1C   # Shortness of breath: Normal CXR, d-dimer negative, SpO2 100% on room air in ED.  Potentially related to anxiety state, though must consider cardiac cause.  - Continuous pulse oximetry  - follow respiratory status   # ?Schizophrenia, stable: Psych issues followed by ACT team at [facility]. Continue seroquel, HOLD haldol PRN   # Depression/Anxiety, stable: Continue home remeron, pristiq, effexor, ativan   # Borderline personality disorder   # CKD, stable: Creatinine 1.27, near baseline.   # Dyslipidemia: Continue home pravachol   # Hypothyroidism: Last TSH 0.259. Free T3 and T4 both wnl. Continue home synthroid   # Hearing loss: has cochlear implants. Cannot have MRI.   FEN/GI: 3 L NS bolus, NS 125cc/hr  Prophylaxis: heparin sq  Disposition: admit to telemetry  Subjective: She is feeling much better this morning. She is still having chest pain exacerbated by standing up. She is also feeling lightheaded when she stands.   Objective: Temp:  [98.1 F (36.7 C)-98.7 F (37.1 C)] 98.3 F (36.8 C) (08/25 0550) Pulse Rate:  [102-131] 104 (08/25 0550) Resp:  [14-20] 16 (08/25 0550) BP: (96-122)/(62-96) 116/62 mmHg (08/25 0550) SpO2:  [95 %-100 %] 96 % (08/25 0550) Weight:  [152 lb 14.4 oz (69.355 kg)] 152 lb 14.4 oz (69.355 kg) (08/25 0204) Physical Exam: General: NAD, laying in bed on side, answers questions appropriately  Cardiovascular: Tachycardic, normal S1/S2, no M/R/G. Tenderness to palpation on left side of chest  Respiratory: CTAB, effort normal  Abdomen: soft, tenderness in epigastrium  Extremities: no edema, 2+ DP bilaterally  Skin: warm and dry   Laboratory:  Recent Labs Lab 12/13/12 1745 12/14/12 0249  WBC 11.7* 10.2  HGB 14.5 12.3  HCT 39.9 35.4*  PLT 259 200    Recent Labs Lab 12/13/12  1802 12/14/12 0055 12/14/12 0249  NA 134* 135  --   K 4.0 3.9  --   CL 102 108  --   CO2 15* 17*  --   BUN 16 14  --   CREATININE 1.27* 1.13* 1.14*  CALCIUM 9.5 7.7*  --   PROT 6.9  --   --   BILITOT 0.2*  --   --   ALKPHOS 83  --   --   ALT 27   --   --   AST 24  --   --   GLUCOSE 132* 100*  --    UA: 8/25: WNL    Recent Labs Lab 12/14/12 0055  TROPONINI <0.30   Imaging/Diagnostic Tests: Clinical Data: Chest pain and tachycardia. CHEST - 2 VIEW Comparison: 11/16/2012 and 08/26/2012  IMPRESSION: No acute cardiopulmonary disease.   Clearance Coots, MD 12/14/2012, 9:12 AM PGY-1, [place] Intern pager: [phone], text pages welcome

## 2012-12-14 NOTE — Progress Notes (Signed)
PHARMACIST - PHYSICIAN ORDER COMMUNICATION  CONCERNING: P&T Medication Policy on Herbal Medications  DESCRIPTION:  This patient's order for: Melatonin  has been noted.  This product(s) is classified as an "herbal" or natural product. Due to a lack of definitive safety studies or FDA approval, nonstandard manufacturing practices, plus the potential risk of unknown drug-drug interactions while on inpatient medications, the Pharmacy and Therapeutics Committee does not permit the use of "herbal" or natural products of this type within [facility].   ACTION TAKEN: The pharmacy department is unable to verify this order at this time and your patient has been informed of this safety policy. Please reevaluate patient's clinical condition at discharge and address if the herbal or natural product(s) should be resumed at that time.   Gracy Bruins, PharmD Clinical Pharmacist [facility]

## 2012-12-14 NOTE — Progress Notes (Signed)
FMTS Attending Daily Note:  Annabell Sabal MD  [phone] pager  Family Practice pager:  [phone] I have discussed this patient with the resident Dr. Raeford Razor and attending physician Dr. Gwendlyn Deutscher.  I agree with their findings, assessment, and care plan

## 2012-12-14 NOTE — Discharge Summary (Signed)
[facility] Discharge Summary  Patient name: Brandi Love Medical record number: [id number] Date of birth: Oct 27, 1975 Age: 37 y.o. Gender: female Date of Admission: 12/13/2012  Date of Discharge: 12/14/12 Admitting Physician: Andrena Mews, MD  Primary Care Provider: Pcp Not In System Consultants: none  Indication for Hospitalization: shortness of breath, chest pain  Discharge Diagnoses/Problem List:  Bulimia Eating disorder  Dyslipidemia  Sinus Tachycardia  Borderline personality disorder  Hypothyroidism  Depression  Anxiety   Disposition: home   Discharge Condition: stable  Brief Hospital Course:  Brandi Love is a 37 y.o. year old female presenting with dehydration, weakness, palpitations. PMH is significant for long history of restrictive bulimia, CKD stage 3, inappropriate sinus tachycardia, hypothyroidism, borderline personality disorder, deafness.   # Bulimia / Dehydration: >20 year history of bulimia, restrictive, laxative abuse. 3L bolus while in ED improving symptoms. BPs stable around 100-110s/70-90s. BMET in ED: Bicarb 15, AG 17. These abnormalities likely related to significant recent laxative abuse, less likely from ischemia in gut given normal range lactic acid. Suspect bulimia has caused dehydration and is contributing to patient feeling poorly recently.  - Continue fluids NS 125cc/hr, tolerating PO prior to discharge  - continue to follow symptom improvement  - Had a scheduled appointment at 5:00 pm on 8/25 with her Eating disorder Therapist - TSH 0.305 - Hgb A1c: 5.6 - was prescribed zofran 4 mg #20 prior to discharge for nausea, will follow up with primary doctor for refills.   # Palpitations / Tachycardia / chest pain: Tachy at baseline, dx'd with inappropriate sinus tachycardia, last seen cardiologist 3 months ago. Metoprolol XL recently started by PCP. Chest pain potentially related to frequent vomiting inducing MSK cause  especially since patient with reproducible pain. Patient with appointment with Dr. Einar Gip on 12/24/12  - chest pain reproducible on exam  - troponin (-) x 2;POC trop (-); - Repeat EKG: 8/25: sinus tachycardia  - Continue home metoprolol XL  - Pregnancy test: negative  - D-dimer: 0.35  - TSH 0.305 - Hgb A1c: 5.6   # Shortness of breath: Normal CXR, d-dimer negative, SpO2 100% on room air in ED. Potentially related to anxiety state, though must consider cardiac cause.   - no working of breathing or Oxygen demand prior to discharge   # ?Schizophrenia, stable: Psych issues followed by ACT team at [facility]. Continue seroquel, HOLD haldol PRN   # Depression/Anxiety, stable: Continue home remeron, pristiq, effexor, ativan   # Borderline personality disorder   # CKD, stable: Creatinine 1.27, near baseline.   # Dyslipidemia: Continue home pravachol   # Hypothyroidism: Last TSH 0.259. Free T3 and T4 both wnl. Continue home synthroid. TSH 0.305  # Hearing loss: has cochlear implants. Cannot have MRI.   Issues for Follow Up:  1. Bulimia: Follow up with eating disorder therapist on 8/25 at 5:00.  2.Sinus Tachycardia: continued on home Metoprolol XL  3. Hypothyroidism: TSH stable at discharge.   Significant Procedures: none  Significant Labs and Imaging:   Recent Labs Lab 12/13/12 1745 12/14/12 0249  WBC 11.7* 10.2  HGB 14.5 12.3  HCT 39.9 35.4*  PLT 259 200    Recent Labs Lab 12/13/12 1802 12/14/12 0055 12/14/12 0249  NA 134* 135  --   K 4.0 3.9  --   CL 102 108  --   CO2 15* 17*  --   GLUCOSE 132* 100*  --   BUN 16 14  --   CREATININE 1.27*  1.13* 1.14*  CALCIUM 9.5 7.7*  --   ALKPHOS 83  --   --   AST 24  --   --   ALT 27  --   --   ALBUMIN 3.6  --   --       Recent Labs Lab 12/14/12 0055 12/14/12 0830  TROPONINI <0.30 <0.30     Results/Tests Pending at Time of Discharge: none   Discharge Medications:    Medication List         BENADRYL 25 mg capsule   Generic drug:  diphenhydrAMINE  Take 25 mg by mouth every 6 (six) hours as needed (to alleviate cramps and twitching when taking haldol).     bisacodyl 5 MG EC tablet  Commonly known as:  DULCOLAX  Take 30-35 mg by mouth daily. 7 tablets in the am and 6 tablets at noon and 7 tablets at bedtime     desvenlafaxine 50 MG 24 hr tablet  Commonly known as:  PRISTIQ  Take 50 mg by mouth daily.     docusate sodium 100 MG capsule  Commonly known as:  COLACE  Take 200 mg by mouth 2 (two) times daily.     haloperidol 5 MG tablet  Commonly known as:  HALDOL  Take 5 mg by mouth every 12 (twelve) hours as needed (for anxiety or going through crisis events).     lamoTRIgine 25 MG tablet  Commonly known as:  LAMICTAL  Take 50 mg by mouth every morning.     levothyroxine 175 MCG tablet  Commonly known as:  SYNTHROID, LEVOTHROID  Take 175 mcg by mouth daily.     LORazepam 0.5 MG tablet  Commonly known as:  ATIVAN  Take 0.5 mg by mouth 2 (two) times daily. 12 p and 8 p     magnesium oxide 400 MG tablet  Commonly known as:  MAG-OX  Take 200 mg by mouth daily.     Melatonin 3 MG Tabs  Take 3 mg by mouth at bedtime.     methocarbamol 750 MG tablet  Commonly known as:  ROBAXIN  Take 750 mg by mouth 3 (three) times daily.     metoprolol succinate 50 MG 24 hr tablet  Commonly known as:  TOPROL-XL  Take 50 mg by mouth daily. Take with or immediately following a meal.     mirtazapine 30 MG tablet  Commonly known as:  REMERON  Take 60 mg by mouth at bedtime.     multivitamin with minerals Tabs tablet  Take 1 tablet by mouth daily.     omega-3 acid ethyl esters 1 G capsule  Commonly known as:  LOVAZA  Take 2 g by mouth 2 (two) times daily.     omeprazole 20 MG capsule  Commonly known as:  PRILOSEC  Take 20 mg by mouth daily.     ondansetron 4 MG tablet  Commonly known as:  ZOFRAN  Take 1 tablet (4 mg total) by mouth every 8 (eight) hours as needed for nausea.     ondansetron 4  MG tablet  Commonly known as:  ZOFRAN  Take 4 mg by mouth every 8 (eight) hours as needed for nausea.     potassium chloride 10 MEQ tablet  Commonly known as:  K-DUR  Take 1 tablet (10 mEq total) by mouth 2 (two) times daily.     pravastatin 20 MG tablet  Commonly known as:  PRAVACHOL  Take 20 mg by mouth every evening.  pregabalin 25 MG capsule  Commonly known as:  LYRICA  Take 25 mg by mouth 4 (four) times daily.     QUEtiapine 100 MG tablet  Commonly known as:  SEROQUEL  Take 200 mg by mouth 2 (two) times daily. Takes at 8am, 12pm     QUEtiapine 400 MG tablet  Commonly known as:  SEROQUEL  Take 400 mg by mouth at bedtime.     SUMAtriptan 100 MG tablet  Commonly known as:  IMITREX  Take 100 mg by mouth every 2 (two) hours as needed for migraine.     Vitamin D (Ergocalciferol) 50000 UNITS Caps capsule  Commonly known as:  DRISDOL  Take 50,000 Units by mouth every 7 (seven) days. On Sunday        Discharge Instructions: Please refer to Patient Instructions section of EMR for full details.  Patient was counseled important signs and symptoms that should prompt return to medical care, changes in medications, dietary instructions, activity restrictions, and follow up appointments.   Follow-Up Appointments: Follow-up Information   Follow up On 12/24/2012.   Contact information:   Dr. Wilma Flavin   [address]  [place], [place] [address] [phone]      Clearance Coots, MD 12/16/2012, 2:23 PM PGY-1, [place]

## 2012-12-14 NOTE — Progress Notes (Signed)
Utilization review completed.  

## 2012-12-14 NOTE — H&P (Signed)
FMTS Attending Admission Note: Brandi Derossett,MD I  have seen and examined this patient, reviewed their chart. I have discussed this patient with the resident. I agree with the resident's findings, assessment and care plan.  37 y/O F with Pmx borderline personality disorder,Bulimia,Tachycardia,deafness with use of hearing aid present to the hospital with hx of chest pressure,mostly on the left side with palpitations as well as generalized weakness,she has episodes of vomiting and diarrhea which she attributed to the laxative she takes daily to help with weight loss.  Filed Vitals:   12/13/12 2200 12/13/12 2245 12/14/12 0204 12/14/12 0550  BP: 107/81 114/96 122/83 116/62  Pulse: 107 109 102 104  Temp:   98.1 F (36.7 C) 98.3 F (36.8 C)  TempSrc:   Oral   Resp: 14 19 16 16   Height:   5\' 8"  (1.727 m)   Weight:   152 lb 14.4 oz (69.355 kg)   SpO2: 100% 100% 100% 96%   Exam: Gen: Calm in bed,not in distress. HEENT: PERRLA,EOMI,Oral mucosa not dry. Resp: Air entry equal B/L,no wheezing or crepitations. Heart: S1 S2 nomal,RRR,no murmurs. Abd: NT/ND,BS normal. Ext: No edema.  A/P: 37 y/O F with. 1. Chest pain and palpitation/Tachycardia: may be related to dehydration.     EKG reviewed with flipped Twave on Lead III,V3 and AVF,TNI neg x 2.     No telemetry alarm documented.     Chest pain improved.     ACS unlikely.     Continue metoprolol for HR control.     Continue telemetry monitoring.  2. Bulimia and dehydration:     She was mildly dehydrated on admission. Hydration status improved S/P 3 L bolus in the ED.     Continue NS 125cc/hr.     Counseling done on her nutrition status.     Consider psych follow up.  3. Hypothyroidism on Synthroid.     Last TSH was about 0.2     Recommend repeat TSH to make sure it is not lower causing her palpitation and tachycardia.

## 2012-12-17 NOTE — Discharge Summary (Signed)
Physician Discharge Summary  Brandi Love [id number] DOB: May 12, 1975 DOA: 11/23/2012  PCP: Pcp Not In System  Admit date: 11/23/2012 Discharge date: 12/17/2012  Time spent:45 minutes  Recommendations for Outpatient Follow-up:  1. ACT Team and Outpatient Eating disorder specialist  Discharge Diagnoses:  Principal Problem:   Acute on chronic renal failure Active Problems:   Dehydration   Hyponatremia   Borderline personality disorder   Protein-calorie malnutrition, severe   Eating disorder, unspecified   Unspecified hypothyroidism   Dyslipidemia   Sinus tachycardia   Discharge Condition: stable Diet recommendation:regular, high protein diet  Filed Weights   11/23/12 1513  Weight: 70.943 kg (156 lb 6.4 oz)    History of present illness:  Brandi Love is an 37 y.o. female past medical history of bulimia, depression, personality disorder with recent admissions to behavioral health for suicide attempt in April and July of 2014 an admission to the hospital 11/12/2012-11/18/2012 for evaluation of abdominal pain and diarrhea. She had been treated for dehydration and acute renal failure with no specific etiology identified for her abdominal pain. Her C. difficile PCR test was negative and stool cultures were negative. Her creatinine returned to baseline values of 1.3-1.5 status post IV fluids. She then presented to her PCP today after a syncopal episode last night when she became lightheaded upon standing. She says she lost consciousness and woke up on the bathroom floor. Her PCP referred her to the emergency department (due to tachycardia), where she was noted to have a creatinine elevated over her usual baseline values and clinical dehydration. Patient admits to self-induced vomiting, taking Hydroxycut and abuse of laxatives secondary to weight gain. Last saw her therapist 3 days ago.   Hospital Course:  Acute on chronic renal failure secondary to dehydration from self induced vomiting  and laxative abuse / Syncope  -Syncopal event was a vasovagal response to dehydration and vomiting induction.  -no events on Tele, stable and improved after rehydration -improved, creatinine back to baseline,  IVF stopped  Sinus tachycardia  -improved with fluid resuscitation.  -EKG reviewed and is sinus in nature   Hyponatremia  -Likely from dehydration. Hydrate.  -corrected  Borderline personality disorder  -Psychiatric consultation requested; no med changes recommended   Protein-calorie malnutrition, severe  -Dietician consultation, due to eating disorder  Eating disorder, unspecified  -she had a safety sitter during hospitalization, was seen by psychiatrist and not felt to require inpatient psychiatric stabilization, we explored [facility], [facility] and other facilities which were not able to accept her and she was unable to afford private rehab facilities. At this point she is being discharged back home with outpatient close Fu with ACT Team and Eating disorder specialist  Unspecified hypothyroidism  -Continue synthroid.   Dyslipidemia  -Continue statin and fish oil.      Consultations:  Psychiatry  Discharge Exam: Filed Vitals:   11/26/12 0521  BP: 101/66  Pulse: 81  Temp: 97.7 F (36.5 C)  Resp: 18    General: AAOx3 Cardiovascular: S1S2/RRR Respiratory: CTAB  Discharge Instructions  Discharge Orders   Future Orders Complete By Expires   Increase activity slowly  As directed        Medication List    STOP taking these medications       methocarbamol 750 MG tablet  Commonly known as:  ROBAXIN     OVER THE COUNTER MEDICATION      TAKE these medications       BENADRYL 25 mg capsule  Generic drug:  diphenhydrAMINE  Take 25 mg by mouth every 6 (six) hours as needed (to alleviate cramps and twitching when taking haldol).     bisacodyl 5 MG EC tablet  Commonly known as:  DULCOLAX  Take 30-35 mg by mouth daily. 7 tablets in the am and 6  tablets at noon and 7 tablets at bedtime     desvenlafaxine 50 MG 24 hr tablet  Commonly known as:  PRISTIQ  Take 50 mg by mouth daily.     docusate sodium 100 MG capsule  Commonly known as:  COLACE  Take 200 mg by mouth 2 (two) times daily.     haloperidol 5 MG tablet  Commonly known as:  HALDOL  Take 5 mg by mouth every 12 (twelve) hours as needed (for anxiety or going through crisis events).     lamoTRIgine 25 MG tablet  Commonly known as:  LAMICTAL  Take 50 mg by mouth every morning.     levothyroxine 175 MCG tablet  Commonly known as:  SYNTHROID, LEVOTHROID  Take 175 mcg by mouth daily.     LORazepam 0.5 MG tablet  Commonly known as:  ATIVAN  Take 0.5 mg by mouth 2 (two) times daily. 12 p and 8 p     magnesium oxide 400 MG tablet  Commonly known as:  MAG-OX  Take 200 mg by mouth daily.     Melatonin 3 MG Tabs  Take 3 mg by mouth at bedtime.     mirtazapine 30 MG tablet  Commonly known as:  REMERON  Take 60 mg by mouth at bedtime.     multivitamin with minerals Tabs tablet  Take 1 tablet by mouth daily.     omega-3 acid ethyl esters 1 G capsule  Commonly known as:  LOVAZA  Take 2 g by mouth 2 (two) times daily.     omeprazole 20 MG capsule  Commonly known as:  PRILOSEC  Take 20 mg by mouth daily.     ondansetron 4 MG tablet  Commonly known as:  ZOFRAN  Take 4 mg by mouth every 8 (eight) hours as needed for nausea.     pravastatin 20 MG tablet  Commonly known as:  PRAVACHOL  Take 20 mg by mouth every evening.     pregabalin 25 MG capsule  Commonly known as:  LYRICA  Take 25 mg by mouth 4 (four) times daily.     QUEtiapine 100 MG tablet  Commonly known as:  SEROQUEL  Take 200 mg by mouth 2 (two) times daily. Takes at 8am, 12pm     QUEtiapine 400 MG tablet  Commonly known as:  SEROQUEL  Take 400 mg by mouth at bedtime.     Vitamin D (Ergocalciferol) 50000 UNITS Caps capsule  Commonly known as:  DRISDOL  Take 50,000 Units by mouth every 7 (seven)  days. On Sunday       Allergies  Allergen Reactions  . Iodinated Diagnostic Agents Anaphylaxis    'cant breathe'  . Abilify [Aripiprazole] Nausea And Vomiting  . Dihydroergotamine Nausea And Vomiting  . Doxycycline Nausea And Vomiting  . Entex Lq [Phenylephrine-Guaifenesin] Other (See Comments)    unknown  . Erythromycin Nausea And Vomiting  . Lactose Intolerance (Gi) Nausea And Vomiting  . Nsaids Other (See Comments)    Unable to ttake due to renal insufficency  . Tape Other (See Comments)    Redness, can use paper tape       Follow-up Information   Follow up with  ACT team and Eating disorder specialist as scheduled.       The results of significant diagnostics from this hospitalization (including imaging, microbiology, ancillary and laboratory) are listed below for reference.    Significant Diagnostic Studies: Ct Abdomen Pelvis Wo Contrast  11/17/2012   *RADIOLOGY REPORT*  Clinical Data: Abdominal pain  CT ABDOMEN AND PELVIS WITHOUT CONTRAST  Technique:  Multidetector CT imaging of the abdomen and pelvis was performed following the standard protocol without intravenous contrast.  Comparison: 08/26/2012, 11/13/2012  Findings: Minimal atelectasis is noted in the bases bilaterally. No focal confluent infiltrate is seen.  The liver and spleen are within normal limits.  The gallbladder is contracted without evidence of stones.  The adrenal glands and pancreas are unremarkable as well.  The kidneys are well visualized bilaterally without evidence of renal calculi or urinary tract obstructive changes.  Scanning into the pelvis demonstrates a bladder to be well distended without opacified urine.  No pelvic mass lesion or side wall abnormality is noted.  The uterus has been surgically removed. A few scattered lymph nodes are noted within the mesentery although none of these are significant by size criteria.  The osseous structures are grossly unremarkable.  IMPRESSION: No acute abnormality  is noted.  No significant change from prior study.   Original Report Authenticated By: Inez Catalina, M.D.   Dg Chest 2 View  12/13/2012   *RADIOLOGY REPORT*  Clinical Data: Chest pain and tachycardia.  CHEST - 2 VIEW  Comparison: 11/16/2012 and 08/26/2012  Findings: Lungs are adequately inflated without consolidation or effusion.  The cardiomediastinal silhouette and remainder of the exam is unchanged.  IMPRESSION: No acute cardiopulmonary disease.   Original Report Authenticated By: Marin Olp, M.D.    Microbiology: No results found for this or any previous visit (from the past 240 hour(s)).   Labs: Basic Metabolic Panel:  Recent Labs Lab 12/13/12 1802 12/14/12 0055 12/14/12 0249  NA 134* 135  --   K 4.0 3.9  --   CL 102 108  --   CO2 15* 17*  --   GLUCOSE 132* 100*  --   BUN 16 14  --   CREATININE 1.27* 1.13* 1.14*  CALCIUM 9.5 7.7*  --    Liver Function Tests:  Recent Labs Lab 12/13/12 1802  AST 24  ALT 27  ALKPHOS 83  BILITOT 0.2*  PROT 6.9  ALBUMIN 3.6   No results found for this basename: LIPASE, AMYLASE,  in the last 168 hours No results found for this basename: AMMONIA,  in the last 168 hours CBC:  Recent Labs Lab 12/13/12 1745 12/14/12 0249  WBC 11.7* 10.2  NEUTROABS 7.3  --   HGB 14.5 12.3  HCT 39.9 35.4*  MCV 88.7 90.1  PLT 259 200   Cardiac Enzymes:  Recent Labs Lab 12/14/12 0055 12/14/12 0830  TROPONINI <0.30 <0.30   BNP: BNP (last 3 results) No results found for this basename: PROBNP,  in the last 8760 hours CBG: No results found for this basename: GLUCAP,  in the last 168 hours     Signed:  Sher Shampine  [facility] 12/17/2012, 10:01 AM

## 2012-12-19 NOTE — Discharge Summary (Signed)
Family Medicine Teaching Service  Discharge Note : Attending Jeff Alyra Patty MD Pager [phone] Inpatient Team Pager:  [phone]  I have reviewed this patient and the patient's chart and have discussed discharge planning with the resident at the time of discharge. I agree with the discharge plan as above.    

## 2013-02-16 ENCOUNTER — Encounter: Payer: Self-pay | Admitting: Family Medicine

## 2013-02-16 ENCOUNTER — Ambulatory Visit (INDEPENDENT_AMBULATORY_CARE_PROVIDER_SITE_OTHER): Payer: Medicare Other | Admitting: Family Medicine

## 2013-02-16 VITALS — Ht 68.0 in | Wt 165.2 lb

## 2013-02-16 DIAGNOSIS — N183 Chronic kidney disease, stage 3 unspecified: Secondary | ICD-10-CM

## 2013-02-16 DIAGNOSIS — F509 Eating disorder, unspecified: Secondary | ICD-10-CM

## 2013-02-16 NOTE — Patient Instructions (Addendum)
-   Please ask Dr. Tammi Klippel to recheck vitamin D, and let Dr. Jenne Campus what the level is.     - Email [email or web address].  (Put your name in the subject line.)  Eat at least 3 meals and 1-2 snacks per day.  Aim for no more than 5 hours between eating.  - All studies that have looked at meal frequency and weight loss have shown that those who eat AT LEAST 3 X a day (or more) lose more weight.    - Breakfast needs to include a source of protein AND some carbohydrate.  This could be chicken and fruit (including apple sauce) or it could be yogurt or eggs      & cheese with fruit.   - The goal for lunch and dinner is to include a protein, a starch, and a vegetable (or fruit).    - Vegetables can be cooked, raw, or in salad.    - Your homework assignment:  Search the internet for ways to prepare cooked vegetables.  Include stir-fry and roasted veg's.  Find at least one way of preparing     a vegetable you might be willing to try making yourself.    Carbohydrates include starch, sugar, and fiber.  We cannot get any calories from fiber.   Starchy (carb) foods include: Bread, rice, pasta, potatoes, corn, crackers, bagels, muffins, all baked goods.  Fruit, milk, and yogurt also have carbohydrate, but usually a little less than the starchy foods.    Protein foods include: Meat, fish, poultry, eggs, dairy foods, and beans such as pinto and kidney beans (beans also provide carbohydrate).   Please sign a release as you leave today, so I can talk to Esperanza Heir and to your [place] team.

## 2013-02-16 NOTE — Progress Notes (Signed)
Medical Nutrition Therapy:  Appt start time: 1330 end time:  1430.  Assessment:  Primary concerns today: CKD and eating disorder.  She was hospitalized for 5 days last week at [facility] due to [facility] and psychosis.  Brandi Love has a long history of eating disorders and mental illness.  When in high school, Brandi Love maintained a very low weight through restrictive eating.  She gained ~15 lb her freshman yr in college, which triggered more severe anorexic behaviors.  She went to the [facility] for 20 days in 2007, having started purging by both vomiting and abusing laxatives and diuretics in 2006.  She subsequently saw a therapist, but no nutritionist b/c of no insurance coverage.  From 2005 to present Brandi Love has been to the state mental institution [facility] in [place].9 times, the last time spending 34 months, from 2011 through Jan 2014.   Currently Brandi Love sees therapist Brandi Love 2 X wk, psychiatrist Brandi Love 1-4 X mo, and PCP Dr. Tammi Love (of [facility]) 2 X mo.       Severe endometriosis led to hysterectomy in 2003.  She dropped out of college during first semester of junior year in 2000 due to ongoing problems related to the endometriosis and false positive breast cancer 2 X (had lumpectomies both times).    Usual eating pattern is erratic; some eating episodes are meals, but there is a lot of snacking.  She has a number of "safe foods," including rotisserie chx, some egg noodles, microwave popcorn, English muffin, cheese, Gatorade G2 (32 oz/day), apple juice (1 qt/day) & V8 (2 qt/day), flavored water, Sierra Mist ~4 X wk, plain water ~4 X wk.  occasionally lemonade.   [name] indicated that today's recommendations will be challenging, but she seemed enthusiastic about trying to meet these goals.  She will be going to see her parents for several days next week, which may offer a good opportunity to try some new foods.    Usual physical activity includes none currently, but hopes to  get a scholarship to the [employer].  24-hr recall (still hospitalized): (Up at 8 AM) B (8:15 AM)-   Berniece Salines & chs omelette, 1/2 c cut up fruit, English muffin, 2 tsp butter, 1/2 sausage link, 4 oz o.j.  Snk ( AM)-   none L ( PM)-  Hamburger w/ bun, [place] fries, Activia yogurt, 20 oz Sprite  Snk ( PM)-  none D ( PM)-  none Snk (8 PM)-  2-3 oz rotisserie chx, 20  oz V-8 Yesterday was atypical b/c bkfst and lunch were at the hospital.  Normally, breakfast consists of chicken and V-8 juice, which she repeats one more time during the day, with varied snacks 1-2 other times during the day.    Progress Towards Goal(s):  In progress.   Nutritional Diagnosis:  NB-1.5 Disordered eating pattern As related to food and weight anxiety.  As evidenced by erratic eating pattern and very limited number of foods accepted.    Intervention:  Nutrition education.  Monitoring/Evaluation:  Dietary intake, exercise,  and body weight in 3 week(s).

## 2013-02-25 ENCOUNTER — Other Ambulatory Visit: Payer: Self-pay

## 2013-03-04 ENCOUNTER — Encounter: Payer: Self-pay | Admitting: Family Medicine

## 2013-03-04 NOTE — Progress Notes (Signed)
Patient ID: Brandi Love, female   DOB: 1975-08-15, 37 y.o.   MRN: [id number] Spoke w/ therapist Esperanza Heir, who said that Medicare has stopped paying for her services b/c they do not cover LPC's.  Brandi Love has not been given a suggested alternative, however.

## 2013-03-09 ENCOUNTER — Ambulatory Visit (INDEPENDENT_AMBULATORY_CARE_PROVIDER_SITE_OTHER): Payer: Medicare Other | Admitting: Family Medicine

## 2013-03-09 ENCOUNTER — Encounter: Payer: Self-pay | Admitting: Family Medicine

## 2013-03-09 VITALS — Ht 68.0 in | Wt 178.2 lb

## 2013-03-09 DIAGNOSIS — N183 Chronic kidney disease, stage 3 unspecified: Secondary | ICD-10-CM

## 2013-03-09 DIAGNOSIS — F509 Eating disorder, unspecified: Secondary | ICD-10-CM

## 2013-03-09 NOTE — Patient Instructions (Signed)
 -   You are doing great with breakfast, and I would like to see you get a REAL MEAL for lunch and dinner.  Let's start with one of these meals: GOAL:  Lunch that looks like, tastes like, and FEELS like a real meal.  This means you will have a protein source, a carbohydrate food, and vegetables.    Starchy (carbohydrate) foods include: Bread, rice, pasta, potatoes, corn, crackers, bagels, muffins, all baked goods.  (Fruits, milk, and yogurt also have carbohydrate, but most of these foods will not spike your blood sugar as the starchy foods will.)  A few fruits do cause high blood sugars; use small portions of bananas (limit to 1/2 at a time), grapes, and tropical fruits.    Protein foods include: Meat, fish, poultry, eggs, dairy foods, and beans such as pinto, black, and garbanzo beans (beans also provide carbohydrate).  (One way to try pinto beans is mashed/pureed in a burrito.  You can also make burritos with mashed black beans.)   GOAL:  Drink at least 24 oz of water per day plus any other liquids each day. This will be important to help resolve your constipation.   GOAL:  Complete your [employer] scholarship application.    Consider walking at locations where you go for appointments or even when shopping.

## 2013-03-09 NOTE — Progress Notes (Signed)
Medical Nutrition Therapy:  Appt start time: 1000 end time:  1100.  Assessment:  Primary concerns today: CKD and eating disorder.  Maddalynn is changing her ACT Architect) team she has had at [facility] therapy.  She will now be getting help through [facility], whom she found through her care coordinator.  She asked her PCP Dr. Wilma Flavin [facility], [phone]) to check her Vitamin D levels, but could not check again b/c a previous doctor had already checked 4 times this year, the max covered by Medicare.   Normally Lestine has her smoothie ~10 or 11, then has chx w/ honey-mustard & V-8 in mid-afternoon, and popcorn for supper.  She said popcorn is about all she can get down at dinner time.  She said she's had a hard time with her eating since not being able to see therapist Esperanza Heir (b/c Medicare will not cover her services).  She did agree, however, to try to get at least 2 real meals each day, breakfast smoothie and lunch.    24-hr recall:  (Up at 10:30 AM) B (11 AM)-  Smoothie (kale, spinach, turnip greens, blueberries, 1 banana, peaches,    strawberries, oatmeal, apple juice) Snk ( AM)-  none L ( PM)-  none Snk (4 PM)-  (Party at [employer]): 2 pc supreme pizza, pretzels, chips, dip, 2 c Pepsi  D (6 PM)-  1 cucumber w/ ginger salad dressing, 1 c G2 Gatorade Snk ( PM)-  none  Progress Towards Goal(s):  In progress.   Nutritional Diagnosis:  NB-1.5 Disordered eating pattern As related to food and weight anxiety.  As evidenced by erratic eating pattern and very limited number of foods accepted.    Intervention:  Nutrition education.  Monitoring/Evaluation:  Dietary intake, exercise,  and body weight in 6 week(s).  No appts available sooner.

## 2013-04-19 ENCOUNTER — Encounter: Payer: Self-pay | Admitting: Family Medicine

## 2013-04-19 ENCOUNTER — Ambulatory Visit (INDEPENDENT_AMBULATORY_CARE_PROVIDER_SITE_OTHER): Payer: Medicare Other | Admitting: Family Medicine

## 2013-04-19 VITALS — Ht 68.0 in | Wt 187.6 lb

## 2013-04-19 DIAGNOSIS — F509 Eating disorder, unspecified: Secondary | ICD-10-CM

## 2013-04-19 DIAGNOSIS — N179 Acute kidney failure, unspecified: Secondary | ICD-10-CM

## 2013-04-19 DIAGNOSIS — N189 Chronic kidney disease, unspecified: Secondary | ICD-10-CM

## 2013-04-19 NOTE — Patient Instructions (Addendum)
-   GOAL: Make three lists of vegetables: (1) those you like and eat now; (2) vegetables you won't even consider; and (3) vegetables you might consider trying if they are prepared a certain way.  Continue to eat veg's you currently eat, but from this last list, choose a vegetable to try at least 3 times a week.  Use small amounts of this vegetable, cut small, combined with foods or seasonings you like.  Email your lists of vegetables to [email or web address].  (PUT YOUR NAME IN THE SUBJECT LINE.)  - For example, you could add small pieces of broccoli to pizza or to soup, or have it with a cheese sauce.  You could also add some to chili or to spaghetti.  You can microwave any frozen vegetable in a bowl (with a plate on top to hold in moisture); do not add extra water.  - TASTE PREFERENCES ARE LEARNED.  This means that it will get easier to choose foods you know are good for you if you are exposed to them enough.     - For best nutrition, you need THREE REAL MEALS a day, which is the ultimate goal.   GOAL:  At least one REAL MEAL a day, which includes a protein food, starch food, and some vegetables (or at least fruit).  This will be more likely to happen if you make sure you have the foods on hand.  - Safe foods:  Spaghetti with alfredo or tomato sauce, hamburger, chicken nuggets, smoothies, oranges, pears, apple sauce, green beans, raw carrots, celery, cucumber, iceberg lettuce, tomatoes.    GOAL:  Exercise at least 3 X week at the [facility].   To be most successful at reaching your goals, you need to track your progress.  Bring your goals sheet to your follow-up appt.    - When you feel an urge to purge, ask yourself these three questions:  (Write down your answers,)  1. WHAT AM I FEELING RIGHT NOW?  2. WHAT DO I WANT TO FEEL?  3. WHAT DO I REALLY NEED RIGHT NOW? - Bring your answers to your next appt on Feb 3.

## 2013-04-19 NOTE — Progress Notes (Signed)
Medical Nutrition Therapy:  Appt start time: 1100 end time:  1200.  Assessment:  Primary concerns today: CKD and eating disorder.  Lamar started purging again a few weeks ago, the last time a couple weeks ago.  She said she is having a hard time not seeing Esperanza Heir for therapy, and she does not understand what leads to her purging.  There have only been a couple times purging was preceded by bingeing.  She is having a team meeting today with her ACT (Assertive Trtmt Team) care team today to try to determine whom she will see for therapy for her eating d/o.   She was at her parents' last week, so felt she ate better than usual.  She barely ate any sweets over the holidays, but she has not been able to get many veg's in her diet b/c she likes so few.   Jasilyn got a partial [employer] scholarship ($23 a month), so she hopes to start as soon as she can get her bill payer to provide this monthly alotment.    24-hr recall:  (Up at 11 AM) B (11 AM)-  5 [employer]'s McNuggets, 1/4 pounder w/ cheese, 2 c Gatorade Snk ( AM)-  none L (2 PM)-  7 McD's McNuggets, 1 med McD's ff's, 16 oz V-8 juice Snk ( PM)-  none D (6 PM)-  4 [employer] McNuggets, 3-4 c spaghetti squash w/ alfredo sauce, 16 oz Gatorade Snk ( PM)-  none When asked how she could improve these food choices, Leen said she would eat less McD's foods and more (some) veg's.  She understands the fundamentals of good nutrition, although she is still unsure of which foods provide protein, and which provide starch.  I gave her a handout on this today.    Progress Towards Goal(s):  In progress.   Nutritional Diagnosis:  NB-1.5 Disordered eating pattern As related to food and weight anxiety.  As evidenced by continued erratic eating pattern and very limited number of foods accepted.    Intervention:  Nutrition education.  Monitoring/Evaluation:  Dietary intake, exercise,  and body weight in 6 week(s).  No appts available sooner.

## 2013-05-25 ENCOUNTER — Ambulatory Visit: Payer: Self-pay | Admitting: Family Medicine

## 2013-07-02 DIAGNOSIS — G43909 Migraine, unspecified, not intractable, without status migrainosus: Secondary | ICD-10-CM | POA: Insufficient documentation

## 2014-02-04 ENCOUNTER — Other Ambulatory Visit: Payer: Self-pay

## 2014-06-23 ENCOUNTER — Emergency Department (HOSPITAL_COMMUNITY)
Admission: EM | Admit: 2014-06-23 | Discharge: 2014-06-24 | Disposition: A | Discharge status: Home / Self Care | Payer: Medicare Other | Attending: Emergency Medicine | Admitting: Emergency Medicine

## 2014-06-23 ENCOUNTER — Encounter (HOSPITAL_COMMUNITY): Payer: Self-pay | Admitting: Emergency Medicine

## 2014-06-23 ENCOUNTER — Emergency Department (HOSPITAL_COMMUNITY): Payer: Medicare Other

## 2014-06-23 DIAGNOSIS — Z8742 Personal history of other diseases of the female genital tract: Secondary | ICD-10-CM | POA: Insufficient documentation

## 2014-06-23 DIAGNOSIS — G43909 Migraine, unspecified, not intractable, without status migrainosus: Secondary | ICD-10-CM | POA: Diagnosis not present

## 2014-06-23 DIAGNOSIS — R197 Diarrhea, unspecified: Secondary | ICD-10-CM | POA: Diagnosis not present

## 2014-06-23 DIAGNOSIS — R1084 Generalized abdominal pain: Secondary | ICD-10-CM | POA: Insufficient documentation

## 2014-06-23 DIAGNOSIS — K219 Gastro-esophageal reflux disease without esophagitis: Secondary | ICD-10-CM | POA: Insufficient documentation

## 2014-06-23 DIAGNOSIS — R112 Nausea with vomiting, unspecified: Secondary | ICD-10-CM | POA: Insufficient documentation

## 2014-06-23 DIAGNOSIS — I129 Hypertensive chronic kidney disease with stage 1 through stage 4 chronic kidney disease, or unspecified chronic kidney disease: Secondary | ICD-10-CM | POA: Diagnosis not present

## 2014-06-23 DIAGNOSIS — R42 Dizziness and giddiness: Secondary | ICD-10-CM | POA: Insufficient documentation

## 2014-06-23 DIAGNOSIS — H9190 Unspecified hearing loss, unspecified ear: Secondary | ICD-10-CM | POA: Diagnosis not present

## 2014-06-23 DIAGNOSIS — E039 Hypothyroidism, unspecified: Secondary | ICD-10-CM | POA: Diagnosis not present

## 2014-06-23 DIAGNOSIS — Z86711 Personal history of pulmonary embolism: Secondary | ICD-10-CM | POA: Diagnosis not present

## 2014-06-23 DIAGNOSIS — Z862 Personal history of diseases of the blood and blood-forming organs and certain disorders involving the immune mechanism: Secondary | ICD-10-CM | POA: Insufficient documentation

## 2014-06-23 DIAGNOSIS — Z79899 Other long term (current) drug therapy: Secondary | ICD-10-CM | POA: Insufficient documentation

## 2014-06-23 DIAGNOSIS — N189 Chronic kidney disease, unspecified: Secondary | ICD-10-CM | POA: Insufficient documentation

## 2014-06-23 DIAGNOSIS — R110 Nausea: Secondary | ICD-10-CM

## 2014-06-23 LAB — LIPASE, BLOOD: LIPASE: 39 U/L (ref 11–59)

## 2014-06-23 LAB — COMPREHENSIVE METABOLIC PANEL
ALT: 38 U/L — ABNORMAL HIGH (ref 0–35)
AST: 37 U/L (ref 0–37)
Albumin: 4.4 g/dL (ref 3.5–5.2)
Alkaline Phosphatase: 50 U/L (ref 39–117)
Anion gap: 9 (ref 5–15)
BILIRUBIN TOTAL: 0.7 mg/dL (ref 0.3–1.2)
BUN: 51 mg/dL — ABNORMAL HIGH (ref 6–23)
CALCIUM: 10.3 mg/dL (ref 8.4–10.5)
CO2: 27 mmol/L (ref 19–32)
Chloride: 98 mmol/L (ref 96–112)
Creatinine, Ser: 2.79 mg/dL — ABNORMAL HIGH (ref 0.50–1.10)
GFR, EST AFRICAN AMERICAN: 24 mL/min — AB (ref >90)
GFR, EST NON AFRICAN AMERICAN: 21 mL/min — AB (ref >90)
GLUCOSE: 108 mg/dL — AB (ref 70–99)
POTASSIUM: 4.2 mmol/L (ref 3.5–5.1)
SODIUM: 134 mmol/L — AB (ref 135–145)
Total Protein: 7.6 g/dL (ref 6.0–8.3)

## 2014-06-23 LAB — CBC
HCT: 44.6 % (ref 36.0–46.0)
HEMOGLOBIN: 15.7 g/dL — AB (ref 12.0–15.0)
MCH: 31.6 pg (ref 26.0–34.0)
MCHC: 35.2 g/dL (ref 30.0–36.0)
MCV: 89.7 fL (ref 78.0–100.0)
Platelets: 367 10*3/uL (ref 150–400)
RBC: 4.97 MIL/uL (ref 3.87–5.11)
RDW: 12.2 % (ref 11.5–15.5)
WBC: 8.2 10*3/uL (ref 4.0–10.5)

## 2014-06-23 MED ORDER — SODIUM CHLORIDE 0.9 % IV SOLN: 1000.0000 mL | INTRAVENOUS | Status: DC

## 2014-06-23 MED ORDER — LORAZEPAM 2 MG/ML IJ SOLN
1.0000 mg | Freq: Once | INTRAMUSCULAR | Status: AC
  Administered 2014-06-24: 1 mg via INTRAVENOUS
  Filled 2014-06-23: qty 1

## 2014-06-23 MED ORDER — SODIUM CHLORIDE 0.9 % IV BOLUS (SEPSIS)
500.0000 mL | Freq: Once | INTRAVENOUS | Status: AC
  Administered 2014-06-24: 500 mL via INTRAVENOUS

## 2014-06-23 MED ORDER — ONDANSETRON HCL 4 MG/2ML IJ SOLN
4.0000 mg | Freq: Once | INTRAMUSCULAR | Status: AC
  Administered 2014-06-23: 4 mg via INTRAVENOUS
  Filled 2014-06-23: qty 2

## 2014-06-23 MED ORDER — SODIUM CHLORIDE 0.9 % IV SOLN
1000.0000 mL | Freq: Once | INTRAVENOUS | Status: AC
  Administered 2014-06-23: 1000 mL via INTRAVENOUS

## 2014-06-23 NOTE — ED Notes (Signed)
Pt was advised by PCP to come to ED for evaluation of dizzy episodes that started today- denies syncope.  Pt has hx of bulimia- states she has been starving herself for months, denies purging.  Pt admits to feeling very weak recently and onset of dizzy spells today.  PCP drew blood work this week- kidney function continues to decline.

## 2014-06-23 NOTE — ED Provider Notes (Signed)
CSN: [id number]     Arrival date & time 06/23/14  1949 History   First MD Initiated Contact with Patient 06/23/14 2056     Chief Complaint  Patient presents with  . Dizziness     (Consider location/radiation/quality/duration/timing/severity/associated sxs/prior Treatment) HPI Comments: Patient with a history of Bulimia and CKD presents today with a chief complaint of dizziness.  She also reports associated nausea and vomiting.  She states that she typically purges several times a week.  However, today she did not purge and had several episodes of vomiting today.  She also reports diarrhea.  However, she states that she has an addiction to taking laxatives so she has diarrhea at baseline.  She states that she has not taken anything for her symptoms prior to arrival.  She also reports associated generalized abdominal pain.  She describes the pain as a "crampy, achy" pain.  She denies urinary symptoms, vaginal discharge, fever, chills, chest pain, or SOB.  No blood in her emesis or blood in her stool.  The history is provided by the patient.    Past Medical History  Diagnosis Date  . Eating disorder   . Anxiety   . Depression   . OCD (obsessive compulsive disorder)   . Pulmonary embolism   . Seizures   . Anemia   . Tachycardia   . Hypertension   . Chronic renal insufficiency   . 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 Endometriosis   . GERD (gastroesophageal reflux disease)   . Migraine   . Deafness    Past Surgical History  Procedure Laterality Date  . Cochlear implant    . Abdominal hysterectomy      'except for right ovary'  . Eye surgery Left 1980  . Eye surgery Right 1990  . Breast lumpectomy Left 2000    x2  . Breast lumpectomy Right 2002    benign  . Laparoscopic abdominal exploration  2001 and 2002    for endometriosis   Family History  Problem Relation Age of Onset  . Cancer Father   . Hyperlipidemia Father   . Osteoarthritis Mother   .  Hyperlipidemia Mother    History  Substance Use Topics  . Smoking status: Never Smoker   . Smokeless tobacco: Never Used  . Alcohol Use: No   OB History    No data available     Review of Systems  All other systems reviewed and are negative.     Allergies  Iodinated diagnostic agents; Abilify; Dihydroergotamine; Doxycycline; Entex lq; Erythromycin; Lactose intolerance (gi); Nsaids; and Tape  Home Medications   Prior to Admission medications   Medication Sig Start Date End Date Taking? Authorizing Provider  bisacodyl (DULCOLAX) 5 MG EC tablet Take 30-35 mg by mouth daily. 7 tablets in the am and 6 tablets at noon and 7 tablets at bedtime    Historical Provider, MD  desvenlafaxine (PRISTIQ) 50 MG 24 hr tablet Take 50 mg by mouth daily.    Historical Provider, MD  diphenhydrAMINE (BENADRYL) 25 mg capsule Take 25 mg by mouth every 6 (six) hours as needed (to alleviate cramps and twitching when taking haldol).    Historical Provider, MD  docusate sodium (COLACE) 100 MG capsule Take 200 mg by mouth 2 (two) times daily.    Historical Provider, MD  haloperidol (HALDOL) 0.5 MG tablet Take 0.5 mg by mouth 1 day or 1 dose. Takes 1/2 of 5-mg pill at 8 AM.  Historical Provider, MD  haloperidol (HALDOL) 5 MG tablet Take 7.5 mg by mouth 1 day or 1 dose. Takes 7.5 mg at 6 PM.    Historical Provider, MD  lamoTRIgine (LAMICTAL) 25 MG tablet Take 100 mg by mouth every morning.     Historical Provider, MD  levothyroxine (SYNTHROID, LEVOTHROID) 175 MCG tablet Take 150 mcg by mouth daily.     Historical Provider, MD  LORazepam (ATIVAN) 0.5 MG tablet Take 0.5 mg by mouth 2 (two) times daily. 12 p and 8 p    Historical Provider, MD  magnesium oxide (MAG-OX) 400 MG tablet Take 200 mg by mouth daily.    Historical Provider, MD  Melatonin 3 MG TABS Take 3 mg by mouth at bedtime.     Historical Provider, MD  methocarbamol (ROBAXIN) 750 MG tablet Take 750 mg by mouth 3 (three) times daily.    Historical  Provider, MD  metoprolol succinate (TOPROL-XL) 50 MG 24 hr tablet Take 50 mg by mouth daily. Take with or immediately following a meal.    Historical Provider, MD  mirtazapine (REMERON) 30 MG tablet Take 45 mg by mouth at bedtime.     Historical Provider, MD  Multiple Vitamin (MULTIVITAMIN WITH MINERALS) TABS Take 1 tablet by mouth daily.    Historical Provider, MD  omega-3 acid ethyl esters (LOVAZA) 1 G capsule Take 2 g by mouth 2 (two) times daily.    Historical Provider, MD  omeprazole (PRILOSEC) 20 MG capsule Take 20 mg by mouth daily.    Historical Provider, MD  ondansetron (ZOFRAN) 4 MG tablet Take 4 mg by mouth every 8 (eight) hours as needed for nausea.    Historical Provider, MD  ondansetron (ZOFRAN) 4 MG tablet Take 1 tablet (4 mg total) by mouth every 8 (eight) hours as needed for nausea. 12/14/12   Rosemarie Ax, MD  potassium chloride (K-DUR) 10 MEQ tablet Take 1 tablet (10 mEq total) by mouth 2 (two) times daily. 12/02/12   Ruthell Rummage Dammen, PA-C  pravastatin (PRAVACHOL) 20 MG tablet Take 20 mg by mouth every evening.    Historical Provider, MD  pregabalin (LYRICA) 25 MG capsule Take 25 mg by mouth 4 (four) times daily.     Historical Provider, MD  QUEtiapine (SEROQUEL) 100 MG tablet Take 200 mg by mouth 1 day or 1 dose. Takes 200 mg at 4 PM.    Historical Provider, MD  QUEtiapine (SEROQUEL) 100 MG tablet Take 100 mg by mouth at bedtime. Take 100 mg at 8 AM and 12 PM.    Historical Provider, MD  QUEtiapine (SEROQUEL) 400 MG tablet Take 400 mg by mouth at bedtime.    Historical Provider, MD  SUMAtriptan (IMITREX) 100 MG tablet Take 100 mg by mouth every 2 (two) hours as needed for migraine.    Historical Provider, MD  Vitamin D, Ergocalciferol, (DRISDOL) 50000 UNITS CAPS Take 50,000 Units by mouth every 7 (seven) days. On Sunday    Historical Provider, MD   BP 105/80 mmHg  Pulse 88  Temp(Src) 97.7 F (36.5 C)  Resp 18  Wt 145 lb 6 oz (65.942 kg)  SpO2 97% Physical Exam   Constitutional: She appears well-developed and well-nourished.  HENT:  Head: Normocephalic and atraumatic.  Mouth/Throat: Oropharynx is clear and moist.  Eyes: EOM are normal. Pupils are equal, round, and reactive to light.  Neck: Normal range of motion. Neck supple.  Cardiovascular: Normal rate, regular rhythm and normal heart sounds.   Pulmonary/Chest: Effort normal  and breath sounds normal.  Abdominal: Soft. Bowel sounds are normal. She exhibits no distension and no mass. There is tenderness. There is no rebound and no guarding.  Mild diffuse tenderness to palpation  Musculoskeletal: Normal range of motion.  Neurological: She is alert. She has normal strength. No cranial nerve deficit or sensory deficit.  Skin: Skin is warm and dry.  Psychiatric: She has a normal mood and affect.  Nursing note and vitals reviewed.   ED Course  Procedures (including critical care time) Labs Review Labs Reviewed  CBC - Abnormal; Notable for the following:    Hemoglobin 15.7 (*)    All other components within normal limits  COMPREHENSIVE METABOLIC PANEL - Abnormal; Notable for the following:    Sodium 134 (*)    Glucose, Bld 108 (*)    BUN 51 (*)    Creatinine, Ser 2.79 (*)    ALT 38 (*)    GFR calc non Af Amer 21 (*)    GFR calc Af Amer 24 (*)    All other components within normal limits  LIPASE, BLOOD    Imaging Review No results found.   EKG Interpretation None     12:29 AM Reassessed patient.  She reports that her pain and nausea has improved.  Will fluid challenge and reassess. MDM   Final diagnoses:  None  Patient with a history of Bulimia presents today with a chief complaint of nausea, vomiting, and dizziness.  She describes the dizziness as lightheadedness.  She denies any vision changes.  Dizziness worse with standing. Normal neurological exam.  Therefore, do not feel that imaging is indicated.  Dizziness improved after given IVF.  Feel that dizziness most likely secondary  to dehydration from vomiting.  Patient with a Creatine of 2.79 today with a BUN of 51.  She has a print out of her most recent BMP with her today from her PCP that was done on 06/20/14.  This showed a Creatine of 2.22 and a BUN of 43.  Therefore, no significant change.  Labs otherwise unremarkable.  Abdomen soft with mild diffuse tenderness to palpation.  No rebound or guarding. Feel that the patient is stable for discharge.  Instructed to follow up with PCP. Return precautions given.     Hyman Bible, PA-C 06/26/14 1315  Blanchie Dessert, MD 06/27/14 1544

## 2014-06-24 DIAGNOSIS — R42 Dizziness and giddiness: Secondary | ICD-10-CM | POA: Diagnosis not present

## 2014-06-24 MED ORDER — PROMETHAZINE HCL 25 MG PO TABS: 25.0000 mg | ORAL_TABLET | Freq: Four times a day (QID) | ORAL | Status: DC | PRN

## 2014-06-24 MED ORDER — SODIUM CHLORIDE 0.9 % IV BOLUS (SEPSIS)
500.0000 mL | Freq: Once | INTRAVENOUS | Status: AC
  Administered 2014-06-24: 500 mL via INTRAVENOUS

## 2014-06-24 NOTE — Discharge Instructions (Signed)
Take Phenergan as needed for nausea. Refer to attached documents for more information. Return to the ED with worsening or concerning symptoms.

## 2014-06-24 NOTE — ED Provider Notes (Signed)
1:21 AM Patient signed out to me by Hyman Bible, PA-C. Patient will have additional IV fluid and be rechecked for nausea.   3:03 AM Patient feeling better and will be discharged.   Alvina Chou, PA-C 06/24/14 [id number]  Everlene Balls, MD 06/24/14 [phone]

## 2014-12-06 ENCOUNTER — Emergency Department (HOSPITAL_COMMUNITY)
Admission: EM | Admit: 2014-12-06 | Discharge: 2014-12-11 | Disposition: A | Discharge status: Home / Self Care | Payer: Medicare Other | Attending: Emergency Medicine | Admitting: Emergency Medicine

## 2014-12-06 ENCOUNTER — Encounter (HOSPITAL_COMMUNITY): Payer: Self-pay | Admitting: Emergency Medicine

## 2014-12-06 DIAGNOSIS — F329 Major depressive disorder, single episode, unspecified: Secondary | ICD-10-CM | POA: Diagnosis not present

## 2014-12-06 DIAGNOSIS — E039 Hypothyroidism, unspecified: Secondary | ICD-10-CM | POA: Insufficient documentation

## 2014-12-06 DIAGNOSIS — R45851 Suicidal ideations: Secondary | ICD-10-CM | POA: Diagnosis present

## 2014-12-06 DIAGNOSIS — F419 Anxiety disorder, unspecified: Secondary | ICD-10-CM | POA: Insufficient documentation

## 2014-12-06 DIAGNOSIS — F333 Major depressive disorder, recurrent, severe with psychotic symptoms: Secondary | ICD-10-CM | POA: Diagnosis not present

## 2014-12-06 DIAGNOSIS — H9190 Unspecified hearing loss, unspecified ear: Secondary | ICD-10-CM | POA: Diagnosis not present

## 2014-12-06 DIAGNOSIS — Z862 Personal history of diseases of the blood and blood-forming organs and certain disorders involving the immune mechanism: Secondary | ICD-10-CM | POA: Diagnosis not present

## 2014-12-06 DIAGNOSIS — I10 Essential (primary) hypertension: Secondary | ICD-10-CM | POA: Diagnosis not present

## 2014-12-06 DIAGNOSIS — Z79899 Other long term (current) drug therapy: Secondary | ICD-10-CM | POA: Diagnosis not present

## 2014-12-06 DIAGNOSIS — G40909 Epilepsy, unspecified, not intractable, without status epilepticus: Secondary | ICD-10-CM | POA: Diagnosis not present

## 2014-12-06 DIAGNOSIS — Z86711 Personal history of pulmonary embolism: Secondary | ICD-10-CM | POA: Diagnosis not present

## 2014-12-06 DIAGNOSIS — K219 Gastro-esophageal reflux disease without esophagitis: Secondary | ICD-10-CM | POA: Diagnosis not present

## 2014-12-06 DIAGNOSIS — F32A Depression, unspecified: Secondary | ICD-10-CM

## 2014-12-06 DIAGNOSIS — Z87448 Personal history of other diseases of urinary system: Secondary | ICD-10-CM | POA: Insufficient documentation

## 2014-12-06 DIAGNOSIS — F42 Obsessive-compulsive disorder: Secondary | ICD-10-CM | POA: Insufficient documentation

## 2014-12-06 DIAGNOSIS — G43909 Migraine, unspecified, not intractable, without status migrainosus: Secondary | ICD-10-CM | POA: Insufficient documentation

## 2014-12-06 LAB — CBC WITH DIFFERENTIAL/PLATELET
Basophils Absolute: 0 10*3/uL (ref 0.0–0.1)
Basophils Relative: 0 % (ref 0–1)
EOS PCT: 1 % (ref 0–5)
Eosinophils Absolute: 0.1 10*3/uL (ref 0.0–0.7)
HCT: 37 % (ref 36.0–46.0)
Hemoglobin: 12.6 g/dL (ref 12.0–15.0)
LYMPHS ABS: 2.6 10*3/uL (ref 0.7–4.0)
Lymphocytes Relative: 39 % (ref 12–46)
MCH: 31.7 pg (ref 26.0–34.0)
MCHC: 34.1 g/dL (ref 30.0–36.0)
MCV: 93.2 fL (ref 78.0–100.0)
MONOS PCT: 7 % (ref 3–12)
Monocytes Absolute: 0.4 10*3/uL (ref 0.1–1.0)
Neutro Abs: 3.6 10*3/uL (ref 1.7–7.7)
Neutrophils Relative %: 53 % (ref 43–77)
PLATELETS: 294 10*3/uL (ref 150–400)
RBC: 3.97 MIL/uL (ref 3.87–5.11)
RDW: 12.4 % (ref 11.5–15.5)
WBC: 6.8 10*3/uL (ref 4.0–10.5)

## 2014-12-06 LAB — I-STAT CHEM 8, ED
BUN: 24 mg/dL — ABNORMAL HIGH (ref 6–20)
CALCIUM ION: 1.23 mmol/L (ref 1.12–1.23)
CHLORIDE: 110 mmol/L (ref 101–111)
Creatinine, Ser: 2.2 mg/dL — ABNORMAL HIGH (ref 0.44–1.00)
GLUCOSE: 98 mg/dL (ref 65–99)
HCT: 37 % (ref 36.0–46.0)
Hemoglobin: 12.6 g/dL (ref 12.0–15.0)
Potassium: 4.5 mmol/L (ref 3.5–5.1)
SODIUM: 142 mmol/L (ref 135–145)
TCO2: 20 mmol/L (ref 0–100)

## 2014-12-06 MED ORDER — LAMOTRIGINE 100 MG PO TABS
100.0000 mg | ORAL_TABLET | Freq: Every morning | ORAL | Status: DC
  Administered 2014-12-07 – 2014-12-11 (×5): 100 mg via ORAL
  Filled 2014-12-06 (×5): qty 1

## 2014-12-06 MED ORDER — PROMETHAZINE HCL 25 MG PO TABS
25.0000 mg | ORAL_TABLET | Freq: Four times a day (QID) | ORAL | Status: DC | PRN
  Administered 2014-12-07: 25 mg via ORAL
  Filled 2014-12-06: qty 1

## 2014-12-06 MED ORDER — QUETIAPINE FUMARATE 200 MG PO TABS: 400.0000 mg | ORAL_TABLET | Freq: Every day | ORAL | Status: DC

## 2014-12-06 MED ORDER — QUETIAPINE FUMARATE 200 MG PO TABS
200.0000 mg | ORAL_TABLET | ORAL | Status: DC
  Administered 2014-12-07: 200 mg via ORAL
  Filled 2014-12-06: qty 1

## 2014-12-06 MED ORDER — PREGABALIN 50 MG PO CAPS
50.0000 mg | ORAL_CAPSULE | Freq: Three times a day (TID) | ORAL | Status: DC
  Administered 2014-12-07 – 2014-12-11 (×12): 50 mg via ORAL
  Filled 2014-12-06 (×12): qty 1

## 2014-12-06 MED ORDER — LORAZEPAM 1 MG PO TABS
0.5000 mg | ORAL_TABLET | Freq: Two times a day (BID) | ORAL | Status: DC
  Administered 2014-12-07 – 2014-12-11 (×9): 0.5 mg via ORAL
  Filled 2014-12-06 (×10): qty 1

## 2014-12-06 MED ORDER — BUPROPION HCL ER (XL) 300 MG PO TB24
300.0000 mg | ORAL_TABLET | Freq: Every day | ORAL | Status: DC
  Administered 2014-12-07 – 2014-12-11 (×5): 300 mg via ORAL
  Filled 2014-12-06 (×5): qty 1

## 2014-12-06 MED ORDER — PRAVASTATIN SODIUM 20 MG PO TABS
20.0000 mg | ORAL_TABLET | Freq: Every evening | ORAL | Status: DC
  Administered 2014-12-07 – 2014-12-10 (×5): 20 mg via ORAL
  Filled 2014-12-06 (×7): qty 1

## 2014-12-06 MED ORDER — MAGNESIUM OXIDE 400 (241.3 MG) MG PO TABS
200.0000 mg | ORAL_TABLET | Freq: Every day | ORAL | Status: DC
Start: 2014-12-07 — End: 2014-12-07
  Administered 2014-12-07: 200 mg via ORAL
  Filled 2014-12-06: qty 0.5

## 2014-12-06 MED ORDER — LEVOTHYROXINE SODIUM 150 MCG PO TABS
150.0000 ug | ORAL_TABLET | Freq: Every day | ORAL | Status: DC
  Administered 2014-12-07 – 2014-12-11 (×5): 150 ug via ORAL
  Filled 2014-12-06 (×6): qty 1

## 2014-12-06 MED ORDER — HALOPERIDOL 5 MG PO TABS
7.5000 mg | ORAL_TABLET | ORAL | Status: DC
  Administered 2014-12-07: 7.5 mg via ORAL
  Filled 2014-12-06: qty 2

## 2014-12-06 MED ORDER — QUETIAPINE FUMARATE 25 MG PO TABS: 100.0000 mg | ORAL_TABLET | Freq: Every day | ORAL | Status: DC

## 2014-12-06 MED ORDER — HALOPERIDOL 1 MG PO TABS
0.5000 mg | ORAL_TABLET | ORAL | Status: DC
  Administered 2014-12-07: 0.5 mg via ORAL
  Filled 2014-12-06: qty 1

## 2014-12-06 MED ORDER — DESVENLAFAXINE SUCCINATE ER 50 MG PO TB24
50.0000 mg | ORAL_TABLET | Freq: Every day | ORAL | Status: DC
  Administered 2014-12-07 – 2014-12-11 (×5): 50 mg via ORAL
  Filled 2014-12-06 (×6): qty 1
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
## 2014-12-06 NOTE — Progress Notes (Signed)
Per Martin Majestic at [facility], beds might be available, "will call back when Margarita Grizzle on the line", fax referral. Referral went through at 23:11.  CSW will continue to seek placement for patient.  Verlon Setting, [place] Disposition staff 12/06/2014 11:15 PM

## 2014-12-06 NOTE — ED Notes (Signed)
Pt states she has been having suicidal thoughts for the last 3 days. Pt states she has been thinking about getting a sheet and hanging herself with it. Suicidal attempts in the past. Pts mood is depressed. States she is here to get help so she want hurt herself.

## 2014-12-07 DIAGNOSIS — F329 Major depressive disorder, single episode, unspecified: Secondary | ICD-10-CM | POA: Diagnosis not present

## 2014-12-07 LAB — RAPID URINE DRUG SCREEN, HOSP PERFORMED
AMPHETAMINES: NOT DETECTED
Barbiturates: NOT DETECTED
Benzodiazepines: NOT DETECTED
Cocaine: NOT DETECTED
OPIATES: NOT DETECTED
Tetrahydrocannabinol: NOT DETECTED

## 2014-12-07 LAB — ETHANOL: Alcohol, Ethyl (B): <5 mg/dL (ref <5)

## 2014-12-07 MED ORDER — HALOPERIDOL 5 MG PO TABS
5.0000 mg | ORAL_TABLET | Freq: Three times a day (TID) | ORAL | Status: DC
  Administered 2014-12-08 – 2014-12-11 (×10): 5 mg via ORAL
  Filled 2014-12-07 (×10): qty 1

## 2014-12-07 MED ORDER — ZOLPIDEM TARTRATE 5 MG PO TABS
5.0000 mg | ORAL_TABLET | Freq: Every evening | ORAL | Status: DC | PRN
  Administered 2014-12-07 – 2014-12-10 (×4): 5 mg via ORAL
  Filled 2014-12-07 (×4): qty 1

## 2014-12-07 MED ORDER — TOPIRAMATE 25 MG PO TABS
50.0000 mg | ORAL_TABLET | Freq: Two times a day (BID) | ORAL | Status: DC
  Administered 2014-12-07 – 2014-12-11 (×8): 50 mg via ORAL
  Filled 2014-12-07 (×8): qty 2

## 2014-12-07 MED ORDER — QUETIAPINE FUMARATE 200 MG PO TABS
400.0000 mg | ORAL_TABLET | Freq: Every day | ORAL | Status: DC
  Administered 2014-12-08 – 2014-12-10 (×3): 400 mg via ORAL
  Filled 2014-12-07 (×3): qty 2

## 2014-12-07 MED ORDER — MAGNESIUM OXIDE 400 (241.3 MG) MG PO TABS
200.0000 mg | ORAL_TABLET | Freq: Two times a day (BID) | ORAL | Status: DC
  Administered 2014-12-08 – 2014-12-11 (×7): 200 mg via ORAL
  Filled 2014-12-07 (×10): qty 0.5

## 2014-12-07 MED ORDER — QUETIAPINE FUMARATE 25 MG PO TABS
100.0000 mg | ORAL_TABLET | ORAL | Status: DC
  Administered 2014-12-08 – 2014-12-11 (×4): 100 mg via ORAL
  Filled 2014-12-07 (×4): qty 4

## 2014-12-07 MED ORDER — METOPROLOL SUCCINATE ER 25 MG PO TB24
50.0000 mg | ORAL_TABLET | Freq: Every day | ORAL | Status: DC
  Administered 2014-12-08 – 2014-12-11 (×4): 50 mg via ORAL
  Filled 2014-12-07 (×4): qty 2

## 2014-12-07 MED ORDER — PANTOPRAZOLE SODIUM 40 MG PO TBEC
40.0000 mg | DELAYED_RELEASE_TABLET | Freq: Every day | ORAL | Status: DC
  Administered 2014-12-08 – 2014-12-11 (×4): 40 mg via ORAL
  Filled 2014-12-07 (×4): qty 1

## 2014-12-07 MED ORDER — QUETIAPINE FUMARATE 200 MG PO TABS
200.0000 mg | ORAL_TABLET | Freq: Once | ORAL | Status: AC
  Administered 2014-12-07: 200 mg via ORAL
  Filled 2014-12-07: qty 1

## 2014-12-07 NOTE — ED Notes (Signed)
Pt has hearing aid chargers at bedside

## 2014-12-07 NOTE — ED Notes (Signed)
Pharmacy tech attempted to call the pts pharmacy to confirm her Rx, the pharmacy opens at [place], pharmacy tech to f/u with this RN once she speaks with the pharmacy

## 2014-12-07 NOTE — ED Notes (Signed)
Pt declines shower at this time

## 2014-12-07 NOTE — ED Notes (Signed)
Pt given 2 markers & a coloring book to distract pts urge to throw up d/t hx of bulimia, pt made verbal agreement to return items at 15:00, pt cooperative & calm

## 2014-12-07 NOTE — ED Notes (Signed)
Pt belongings inventoried & recorded on paper form, pts belongings placed in designated POD C area

## 2014-12-07 NOTE — ED Notes (Signed)
Spoke with pharmacy about meds; pt will be given ativan and Pravastatin per pharmacy and the rest of the meds needs to be verified and times changed.
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
## 2014-12-07 NOTE — Progress Notes (Signed)
Spoke with Tommie Sams with [facility]- pt's ACTT [phone]. Per Tommie Sams, pt presented to [facility] ED yesterday and was d/c as "they felt she was not appropriate for inpatient tx. She was not stating she was suicidal." States, "one of our team members went to pick her up and on the way to her home, she said she was going to go to [place] to buy sheets to cut up to hang herself." States team member attempted to redirect pt and avoid ED admission, but "she was adamant and not backing down from saying she wanted to go to the [facility] ED."   States pt is dx with eating disorder but is resistant to RN's on ACTT's involvement. States they have encouraged pt to agree to seeing nutritionist along with her therapist but she has not agreed. States she sees 4-5 ACTT members per week along with DBT and eating disorder therapy 2x week. Reports pt was in [facility] "for years, discharged in 2012." States she has not been inpatient recently, but that she has 20+ known past hospitalizations.   States that they feel pt has been attempting to "manipulating her providers" (PCP, psychiatrist, DBT therapist, eating disorder therapist) to obtain medication changes. States that ACTT MD changed her medications on 8/14 when "he found that she had been driving sedated. She tends to be med-seeking and wants to be sedated."  States, "We feel like she is trying to get back on the medications she wanted and will not back down until she is hospitalized." Reports they have independently contacted [facility] Eating Disorders unit and were told, as this writer was also, that the program could not meet her needs.   Lawanda states ACTT is available for support/assistance with pt's case as needed. Can be reached at [phone]. Advised that psychiatry has recommended inpatient placement and is being sought. Aware of barriers to acute inpatient psychiatric placement with co-existing eating disorder. CSW stated pt's ACTT will be  kept informed on pt's case as it progresses.   Sharren Bridge, MSW, LCSW Clinical Social Work, Disposition  12/07/2014 [phone]

## 2014-12-07 NOTE — Progress Notes (Signed)
Seeking inpatient placement for pt.  [facility] Eating Disorders unit- Per Margarita Grizzle, pt has been referred to the program several times and is declined at this time as they feel program cannot meet her needs. Advises her needs would be better met in an acute setting as she presents with primary MH symptoms rather than eating disorder.  [facility] Crisis Unit- Per Opal Sidles, no beds today, call back tomorrow morning  Referred to: [facility]- per [facility], unlikely will be able to accept as she is being treated for eating disorder by therapist currently, but will review referral [facility]- per Blima Rich- per [facility]- per Joelene Millin  Declined: [facility] - per Cletus Gash due to dx of eating disorder, which facility cannot treat/manage  Left voicemails with [facility], [facility], and [facility] (adult female unit) and will refer if appropriate. All other facilities contacted are at capacity.  Sharren Bridge, MSW, LCSW Clinical Social Work, Disposition  12/07/2014 [phone]

## 2014-12-07 NOTE — ED Notes (Signed)
Spoke with Jeneen Rinks in Pharmacy re: medication, medications to be confirmed with the pts pharmacy once it is open & verified, medications will be rescheduled

## 2014-12-07 NOTE — Progress Notes (Addendum)
Patient referred to [facility]. Demographics given to [facility] at [facility].  Per clinician Asher Muir authorization [id number], effective starting 12/07/14 through 12/13/14.  CSW will continue to seek placement.  Verlon Setting, [place] Disposition staff 12/07/2014 8:59 PM

## 2014-12-07 NOTE — ED Notes (Signed)
Pt had cell phone at bedside to communicate with her therapist, informed pt that she is not allowed a cell phone at the bedside, but that it would be at the desk for her two daily phone calls so she can text due to her hearing deficit.

## 2014-12-07 NOTE — ED Notes (Signed)
Pt has large suit case, bookbag and white belongings back placed behind nurses station; belongings will be inventoried when 2nd RN available to go through belongings; Pt's hearing aids plugged

## 2014-12-08 DIAGNOSIS — F329 Major depressive disorder, single episode, unspecified: Secondary | ICD-10-CM | POA: Diagnosis not present

## 2014-12-08 NOTE — ED Notes (Signed)
Ordered meds from pharmacy

## 2014-12-08 NOTE — Progress Notes (Signed)
Informed Connie at [facility] of pt's past hysterectomy/no pregnancy test needed. Marlowe Kays asked whether pt used ASL, and was informed pt has not been utilizing ASL in ED and notes that she reads lips and has cochlear implant (per notes). Marlowe Kays states pt is added to [facility] waitlist.  Sharren Bridge, MSW, LCSW Clinical Social Work, Disposition  12/08/2014 [phone]

## 2014-12-08 NOTE — Progress Notes (Signed)
Connie at [facility] states H&P and pregnancy test results are needed in order to proceed reviewing pt's referral for waitlist. CSW faxed H&P, will send pregnancy test results when complete and follow up with [facility] as to referral status.  Sharren Bridge, MSW, LCSW Clinical Social Work, Disposition  12/08/2014 [phone]

## 2014-12-08 NOTE — ED Notes (Signed)
Pt had a hysterectomy in  2003.  Will not require a pregnancy test.

## 2014-12-08 NOTE — ED Notes (Signed)
Pt ambulated to the nursing station to get her phone in order to text for one of her phone calls.

## 2014-12-09 DIAGNOSIS — F329 Major depressive disorder, single episode, unspecified: Secondary | ICD-10-CM | POA: Diagnosis not present

## 2014-12-09 NOTE — BH Assessment (Signed)
[facility] Assessment Progress Note    Per Dr. Dwyane Dee, pt reassessed this day.  Pt continues to endorse SI with plan to hang herself.  Pt denies HI.  Pt reports she hears voices telling her to kill herself.  Pt is on the wait list for [facility] at this time.  Shaune Pascal, MS, [facility] Therapeutic Triage Specialist [facility]

## 2014-12-09 NOTE — Progress Notes (Signed)
Pt's case discussed with Dr. Dwyane Dee, who advises pt continues to meet inpatient criteria.  Spoke with Robinette at [facility] who confirms pt remains on waitlist for admission.  Sharren Bridge, MSW, LCSW Clinical Social Work, Disposition  12/09/2014 [phone]

## 2014-12-10 DIAGNOSIS — F329 Major depressive disorder, single episode, unspecified: Secondary | ICD-10-CM | POA: Diagnosis not present

## 2014-12-10 NOTE — ED Notes (Signed)
Pt awake now - ambulating to nurses' desk w/o difficulty. Pt noted to be pleasant, smiling - cochlear implants noted to bil ears.

## 2014-12-10 NOTE — ED Notes (Addendum)
Yvone Neu, EMT relieved sitter for lunch break

## 2014-12-10 NOTE — ED Notes (Signed)
Pt is deaf cell phone is the only way she can communicated with significant other by text message, agree to let pt use her cell phone only for 5 min and then remove and keep on nurse station. Pt had her phone call of 5 min now.

## 2014-12-10 NOTE — ED Provider Notes (Signed)
  Physical Exam  BP 100/66 mmHg  Pulse 86  Temp(Src) 98.4 F (36.9 C) (Oral)  Resp 18  Ht 5\' 4"  (1.626 m)  Wt 145 lb (65.772 kg)  BMI 24.88 kg/m2  SpO2 98%  Physical Exam  ED Course  Procedures  MDM Patient here with suicidal ideation. [facility] Vitals stable this AM. Comfortably sleeping. Cr baseline. No issues per nursing.   Wandra Arthurs, MD 12/10/14 1040

## 2014-12-11 DIAGNOSIS — F329 Major depressive disorder, single episode, unspecified: Secondary | ICD-10-CM | POA: Diagnosis not present

## 2014-12-11 DIAGNOSIS — F333 Major depressive disorder, recurrent, severe with psychotic symptoms: Secondary | ICD-10-CM | POA: Insufficient documentation
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
## 2014-12-11 NOTE — BH Assessment (Signed)
[facility] Assessment Progress Note 12/11/14.  0900  Spoke with pt for reassessment at this time.  Pt reports that "I did good last night."  States she is feeling better and is no longer having thoughts of suicide or thoughts of hanging self.  Pt reports she does continue to have auditory hallucinations and does continue to hear a voice telling her to hurt herself.  She states that she always hears voices, however, and that this does not change.  She asked if it would be possible that she could be discharged today.  Pt informed that TTS will speak with her MD about her plan and get back with her. Lurline Idol, LCSW

## 2014-12-11 NOTE — ED Notes (Signed)
Pt's mother arrived to pick up pt.

## 2014-12-11 NOTE — ED Notes (Signed)
Blanch Media, SW, attempting to reach pt's ACT team member and/or mother. Pt has multiple items in storage that she will be leaving w/that may be too cumbersome for her try to carry riding on the bus.

## 2014-12-11 NOTE — Discharge Instructions (Signed)
Take your medications as prescribed.   See your ACT team and psychiatrist.   Return to ER if you have worse depression, thoughts of harming yourself or others, hallucinations.

## 2014-12-11 NOTE — ED Notes (Signed)
Pt has cochlear implants and charger in room w/her.

## 2014-12-11 NOTE — ED Provider Notes (Addendum)
  Physical Exam  BP 96/59 mmHg  Pulse 89  Temp(Src) 97.9 F (36.6 C) (Oral)  Resp 12  Ht 5\' 4"  (1.626 m)  Wt 145 lb (65.772 kg)  BMI 24.88 kg/m2  SpO2 96%  Physical Exam  ED Course  Procedures  MDM [facility]. No issues per nursing. Medically stable.   11:36 AM Psych reassessed patient this AM. Patient has been calm for the last several days and didn't require sedation. Was [facility] but denies SI or HI and no longer meets criteria. Social work contacted her ACT team and will dc.     Wandra Arthurs, MD 12/11/14 [id number]  Wandra Arthurs, MD 12/11/14 [phone]

## 2014-12-11 NOTE — ED Notes (Signed)
Telepsych being performed. 

## 2014-12-11 NOTE — Progress Notes (Signed)
12:31PM CSW was informed that patient is medically stable and ready for discharge. CSW contacted [facility] via telephone at [phone] to inform of patient discharge. CSW was advised by ACT Team Catha Nottingham to provide patient with a voucher to get home. [place], and will not be able to be transported via bus to location. CSW contacted patient's mother at [phone] but received no answer. CSW followed up with a call to patient's father at [phone]. Father stated that they will be able to transport the patient home, but would not be here until around 2:00pm.   Patient to discharge home today with transportation by parents. CSW to sign off.   Brandi Love, [facility] Ph: [phone]

## 2014-12-12 NOTE — BH Assessment (Signed)
Patient's outpatient psychiatric contacted this writer to inquire about patient's disposition. Writer informed provider that this patient was not a listed patient in ED at this time.

## 2015-02-08 ENCOUNTER — Encounter: Payer: Self-pay | Admitting: *Deleted

## 2015-02-08 ENCOUNTER — Encounter: Payer: Medicare Other | Attending: *Deleted | Admitting: *Deleted

## 2015-02-08 DIAGNOSIS — N183 Chronic kidney disease, stage 3 unspecified: Secondary | ICD-10-CM

## 2015-02-08 DIAGNOSIS — F509 Eating disorder, unspecified: Secondary | ICD-10-CM | POA: Diagnosis not present

## 2015-02-08 DIAGNOSIS — Z713 Dietary counseling and surveillance: Secondary | ICD-10-CM | POA: Diagnosis not present

## 2015-02-08 DIAGNOSIS — E119 Type 2 diabetes mellitus without complications: Secondary | ICD-10-CM | POA: Diagnosis not present

## 2015-02-08 NOTE — Progress Notes (Signed)
Appointment start time: 0800  Appointment end time: 0900  Patient was seen on 02/08/15 for nutrition counseling pertaining to disordered eating.  She is accompanied by her nurse, Tommie Sams  Primary care provider: Molli Barrows Therapist: Esperanza Heir Any other medical team members: Skip Estimable, ACT Team, Dwana Melena, nurse, and Adah Perl, [facility] at ACT   Assessment: This is Brandi Love's initial nutrition assessment.   Patient states she has an eating disorder.  Has been working with Wilfred Lacy for [employer].  Also has type 2 diabetes (not on any medication) for ~ 1year.  No A1C available.  Most recent glucose is 98 mg/dl per medical record.  Brandi Love does not monitor her glucose at home.  Also CKD (stage 3), elevated TAG (500 mg/dl+ per nurse) and treatment team mandated her to see a dietitian.  She is ambivalent about her nutrition.   Says her readiness now is "so-so."  States she was "coerced into coming".   Brandi Love has had 2 hospitalizations this year: in August for depression and in March for dehydration   Growth Metrics: "Ideal body weight": 140 + 14 lb   Eating history: Length of time: off and on since high school; was at [facility] for 25 days in 2007.  Can't remember what was helpful Previous treatments: worked with Iver Nestle ~2 years, states she "didn't get much out of those appointment because I was pretty stubborn" states she wasn't ready to change her habits.  Goals for RD meetings: "Wilfred Lacy wants me to talk with you about my laxative abuse.  Emberlee would like to learn how to control her diabetes.  No formal diabetes education.  Realizes her ED affects DM and TAG  Weight history:  Highest weight: 200 lb (feels that was medication induced)   Lowest weight: 118 lb Most consistent weight: ~125 prior to medication.  Now 160-170 lb (off that medication)   What would you like to weigh:111 lb How has weight changed in the past year: ~maintained, per patient.  Gained and  lost, but basically leveled off  Medical Information: Changes in hair, skin, nails since ED started: none Chewing/swallowing difficulties: none Relux or heartburn: daignsed GERD Trouble with teeth: multiple cavities.  Just had 2 filled, still need more filled LMP without the use of hormones: none s/p hysterectomy 2003   Constipation, diarrhea: more constipation since she's decreasing her laxative (used to take 20/day, now only 1).  BM every other day Positive for low energy, sleep "a lot" most of the day, trying to increasse that Negative for dizziness Migraines. Not often ADHD medication Negative for mood changes   Mental health diagnosis: bulimia, per medical record   Dietary assessment: A typical day consists of 4-5 eating occasions  Safe foods include: cheese, chex mix, Ayesha Rumpf, [employer] foods include:all others  24 hour recall:  Sunday 2 slices flat cheese 2 slices flat cheese 5 handfuls chex mix Pizza lunchable 15 baby carrots with hummus  Yesterday 2 meatball 10 noodles, 5 shrimp 2 meatballs 13 noodles, 9 shrimp 5 carrots, with hummus  Administered EAT-26 Score significant >20 Patient score: 43   What Methods Do You Use To Control Your Weight (Compensatory behaviors)?           Restricting (calories, fat, carbs)  SIV: 1/month  Diet pills: denies  Laxatives: 1/day  Diuretics: denies  Alcohol or drugs: denies  Exercise (what type)  Food rules or rituals (explain): denies  Binge: denies  Estimated energy intake: 1500 kcal  Estimated  energy needs (45% carb, 30% pro, 25% fat for T2DM): 2000 kcal 225 g CHO 150 g pro 56 g fat  Nutrition Diagnosis: NB-1.5 Disordered eating pattern As related to eating disorder.  As evidenced by dietary recall.  Intervention/Goals: Nutrition counseling provided.  Suggested A1c at next PCP visit in 11/14. Nurse states she will ask. Gave food log for 7 days.    Monitoring and Evaluation: Patient will follow  up in 1 weeks.

## 2015-02-15 ENCOUNTER — Encounter: Payer: Medicare Other | Admitting: *Deleted

## 2015-02-15 VITALS — Ht 68.0 in | Wt 173.2 lb

## 2015-02-15 DIAGNOSIS — N183 Chronic kidney disease, stage 3 unspecified: Secondary | ICD-10-CM

## 2015-02-15 DIAGNOSIS — E785 Hyperlipidemia, unspecified: Secondary | ICD-10-CM

## 2015-02-15 DIAGNOSIS — E119 Type 2 diabetes mellitus without complications: Secondary | ICD-10-CM

## 2015-02-15 DIAGNOSIS — F509 Eating disorder, unspecified: Secondary | ICD-10-CM

## 2015-02-15 NOTE — Patient Instructions (Signed)
Aim to eat at the kitchen table Rate hunger and fullness before and after Think about what you life might look like without your eating disorder

## 2015-02-15 NOTE — Progress Notes (Signed)
Patient was seen on 04/16/15 for nutrition counseling pertaining to disordered eating  Appointment start time: 0800  Appointment end time: 0900  Assessment:  Most recent abnormal labs: 12/16/13 A1c: 6.8% 12/30/14 total cholesterol:  213 mg/dl TAG 539 mg/dl HDL 28 mg/dl 02/06/15  BUN 32 mg/dl Creatinine 2.05 GFR 31 Potassium 5.1   Will ask PCP for A1c in November   Can't really tell when she's hungry or full (except overstuffed).  Eats most of her food in the living room.  Eats the same things over and over.  Has OCD and that (comboined with ED) restricts her food intake.  States she weighs herself multiple times/day and that dictates what she eats.  She sometimes eats while distracted (watching tv) but not always.  She knows she eats "automatically" and not according to internal cues.  She does not drink much fluid.  Some days 1 glass, some days 2, some days 3, but usually 1-2 glasses. She still uses 1 laxative/day.  Routinely feels like she eats too much.  Complains of poor energy.  Wants to exercise.  Has noticed that she binges when she restricts.   24 hour recall:  12:00 15 carrots with hummus 12:30 3 handfuls chex mix 6:30 large bowl of spaghetti with alfredo sauce 11:30 13 carrots with hummus Water, apple juice with medicine (1-2 glasses water most days)  Estimated energy intake: 1100 kcal  Estimated energy needs: 2000 kcal 250 g carbohydrate 100 g protein 67 g fat    Nutrition Diagnosis: NB-1.5 Disordered eating pattern As related to eating disorder. As evidenced by dietary recall.  Intervention: Nutrition counseling provided.  Discussed food is energy and when we don't get enough energy, body systems suffer (poor energy, altered satiety signals, etc).  Discussed how things will improve with adequate intake.  Allowed 10 minute walking 3 times/week, no more until her eating improves.  Discussed mindful eating and honoring internal cues (discussed how those cues may not  be discernable right now in her malnourished states). Advised to eat at the table and to rate hunger/fullness before and after meals.  Gave scale to use as guide. Discussed life without eating disorder. Encouraged her to think about what that might look like.  Discussed daily weight fluctuations.  Suggested reflecting on how she uses that information and is it helpful or hurtful?  Monitoring: patient will follow up in 2 weeks, pre scheduling conflict

## 2015-03-01 ENCOUNTER — Encounter: Payer: Medicare Other | Attending: Emergency Medicine | Admitting: *Deleted

## 2015-03-01 ENCOUNTER — Encounter: Payer: Self-pay | Admitting: *Deleted

## 2015-03-01 DIAGNOSIS — N183 Chronic kidney disease, stage 3 unspecified: Secondary | ICD-10-CM

## 2015-03-01 DIAGNOSIS — Z713 Dietary counseling and surveillance: Secondary | ICD-10-CM | POA: Insufficient documentation

## 2015-03-01 DIAGNOSIS — F509 Eating disorder, unspecified: Secondary | ICD-10-CM | POA: Diagnosis not present

## 2015-03-01 DIAGNOSIS — E119 Type 2 diabetes mellitus without complications: Secondary | ICD-10-CM | POA: Insufficient documentation

## 2015-03-01 DIAGNOSIS — E785 Hyperlipidemia, unspecified: Secondary | ICD-10-CM

## 2015-03-01 NOTE — Patient Instructions (Signed)
To me: Level 1/2 is starving!!! Stomach is growling, it's been hours since you ate and you NEED to eat.  You are very hungry Level 3 is hungry, but not starving.  You need to eat your meal because it's been awhile since you ate and maybe stomach is starting to growl Level 4 is slightly hungry, time for a snack Level 5 is neutral, not hungry, not full Level 6 is slightly full/comfortably full.  Just ate and you feel satisfied Level 7 is maybe a little past comfortably fullness.   Level 8 is very full..  Ate past comfort.  Feeling a little uncomfortable Level 9 is stuffed Level 10 is miserably stuffed.  stomach hurts, need to unbutton pants, use the bathroom, etc     Also, write self a note to leave on table reminding you to drink water while eating and reminding you to rate hunger and fullness before and after

## 2015-03-01 NOTE — Progress Notes (Signed)
Patient was seen on 03/01/15 for nutrition counseling pertaining to disordered eating  Appointment start time: 0800  Appointment end time: 0900  Assessment: Brandi Love has started trying to eat at the table!.  Sometimes she still eats her chex mix in the living room, but most foods are eating in her kitchen.  She forgot to monitor her hunger cues prior to eating, so chose not to monitor her fullness afterwards ("all or nothing" mentality).  She has increased her Colace to 2/day, then was experiencing rectal bleeding and MD recommended increasing to 3/day for the rectal tear she has.  She continues to drink inadequate water, but is trying to drink more: a couple glasses/day.  Dietary recall reveals increased variety of foods.  She continues to feel like she eats too much.  She is't sure if that is because she is too full after eating or if it's her eating disorder telling her she ate too much.  After discussion she feels it's her eating disorder She has questions about the "restrict/binge cycle", low energy levels, Thanksgiving, and exercise.  She is anxious about all the food available at Thanksgiving.  She will be with her parents for an entire week.  Her sleep habits continues to be irregular    Most recent abnormal labs: 12/16/13 A1c: 6.8% 12/30/14 total cholesterol:  213 mg/dl TAG 539 mg/dl HDL 28 mg/dl 02/06/15  BUN 32 mg/dl Creatinine 2.05 GFR 31 Potassium 5.1  24 hour recall:  12:00 15 carrots with hummus 12:30 3 handfuls chex mix 6:30 large bowl of spaghetti with alfredo sauce 11:30 13 carrots with hummus Water, apple juice with medicine (1-2 glasses water most days)  Estimated energy intake: 1100 kcal  Estimated energy needs: 2000 kcal 250 g carbohydrate 100 g protein 67 g fat    Nutrition Diagnosis: NB-1.5 Disordered eating pattern As related to eating disorder. As evidenced by dietary recall.  Intervention: Nutrition counseling provided.  Discussed food is energy and when  we don't get enough energy, body systems suffer (poor energy, altered satiety signals, etc).  Discussed how things will improve with adequate intake.  Allowed 10 minute walking 3 times/week, no more until her eating improves.  Discussed mindful eating and honoring internal cues (discussed how those cues may not be discernable right now in her malnourished states).Discused hunger/fullness scale ratings.  Advised she leave herself a note to remind herself to rate internal cues. Also advised to write note reminding her to drink while eating.  Discussed that "all or nothing mentality" and how it's ok to be in the middle.  It's ok to forgot to rate cues sometimes, doesn't mean give up.  It means keep trying. Discussed neutralizing foods.  No good foods and no bad foods.  Food is fuel. Discussed tips for the holidays with eating disorders.  Practice mindfulness and self-care    Monitoring: patient will follow up in 1 weeks

## 2015-03-08 ENCOUNTER — Encounter: Payer: Medicare Other | Admitting: *Deleted

## 2015-03-08 DIAGNOSIS — N183 Chronic kidney disease, stage 3 unspecified: Secondary | ICD-10-CM

## 2015-03-08 DIAGNOSIS — F509 Eating disorder, unspecified: Secondary | ICD-10-CM

## 2015-03-08 NOTE — Progress Notes (Signed)
Patient was seen on 03/08/15 for nutrition counseling pertaining to disordered eating  Appointment start time: 0800  Appointment end time: 0900  Assessment:  Brandi Love states her most recent A1C is 5.5%. Her mom was recently diagnosed with T2DM and mom has started a very strict low carb diet.  This is triggering for Brandi Love as she will be staying with parents for 1 week.  Therapist advised Brandi Love to challenge this and ask for carbs to be made available.  Brandi Love did this.  She is concerned about Thanksgiving as mom typically doesn't remember to allow Brandi Love to serve her own plate Brandi Love was eating at her kitchen table, but routinely turns around and looks over at her shoulder to the bathroom where she normally purges. She has questions about frequency of meals and normal bowel movements    24 hour recall:  7 handfuls of chex mix 2/3 bowl spaghetti  Estimated energy intake: 1000 kcal  Estimated energy needs: 2000 kcal 250 g carbohydrate 100 g protein 67 g fat    Nutrition Diagnosis: NB-1.5 Disordered eating pattern As related to eating disorder. As evidenced by dietary recall.  Intervention: Nutrition counseling provided.  Answered questions about DM meal planning; Regular BM; Frequency of meals (every 3-5 waking hours;  She agreed to try pm snack of carrots and hummus); Thanksgiving; eating while staring at the bathroom door (for now ok to eat in living room, but eat without distractions) Advised on water consumption (8 oz with meals)   Monitoring: patient will follow up in 2 weeks

## 2015-03-15 ENCOUNTER — Ambulatory Visit: Payer: Self-pay | Admitting: *Deleted

## 2015-03-22 ENCOUNTER — Encounter: Payer: Medicare Other | Admitting: *Deleted

## 2015-03-22 ENCOUNTER — Encounter: Payer: Self-pay | Admitting: *Deleted

## 2015-03-22 DIAGNOSIS — F509 Eating disorder, unspecified: Secondary | ICD-10-CM

## 2015-03-22 DIAGNOSIS — N183 Chronic kidney disease, stage 3 (moderate): Secondary | ICD-10-CM | POA: Diagnosis not present

## 2015-03-22 NOTE — Progress Notes (Signed)
Patient was seen on 03/22/15 for nutrition counseling pertaining to disordered eating  Appointment start time: 0800  Appointment end time: 0900  Assessment:  Brandi Love is doing well.  Her week with her parents was really good.  Mom did not make a big deal over her eating and that was helpful.  [name] ate at the table but it was hard to eat mindfully as mom likes to watch tv while eating.  [name] ate a variety of foods outside her comfort zone.  She felt she ate way too much and she did restrict when she got back home on Monday.  Dietary recall reveals she did not eat too much.  She has not been challenging ED thoughts.  BM are normal: 1-2/day and she now feels that is appropriate.  Still 3 laxatives/day, per [name] per MD orders for her rectal bleeding.  She states she doesn't know how long she's supposed to be taking 3 laxatives.   She had a few days of appropriate water intake, but most days still inadequate.  When questioned about the higher fluid days, she says she "focused more"   24 hour recall:  1/2 chipotle Leftovers   Estimated energy intake: <1000 kcal  Estimated energy needs: 2000 kcal 250 g carbohydrate 100 g protein 67 g fat    Nutrition Diagnosis: NB-1.5 Disordered eating pattern As related to eating disorder. As evidenced by dietary recall.  Intervention: Nutrition counseling provided.  Recommended eating every 3-5 hours and not going longer than 5 hours.  Recommended challenging Ed thoughts by reviewing food log to see she wasn't stuffed after eating so she didn't eat too much Recommended keeping water cup with her always, in whatever room she's in to remind her to drink Suggested a craft activity to help with anxiety when she's awake  Monitoring: patient will follow up in 1 weeks

## 2015-03-23 ENCOUNTER — Ambulatory Visit: Payer: Self-pay | Admitting: *Deleted

## 2015-03-28 ENCOUNTER — Encounter: Payer: Self-pay | Admitting: *Deleted

## 2015-03-28 ENCOUNTER — Encounter: Payer: Medicare Other | Attending: Emergency Medicine | Admitting: *Deleted

## 2015-03-28 DIAGNOSIS — N183 Chronic kidney disease, stage 3 unspecified: Secondary | ICD-10-CM

## 2015-03-28 DIAGNOSIS — F509 Eating disorder, unspecified: Secondary | ICD-10-CM | POA: Diagnosis not present

## 2015-03-28 DIAGNOSIS — E785 Hyperlipidemia, unspecified: Secondary | ICD-10-CM

## 2015-03-28 DIAGNOSIS — E119 Type 2 diabetes mellitus without complications: Secondary | ICD-10-CM

## 2015-03-28 DIAGNOSIS — Z713 Dietary counseling and surveillance: Secondary | ICD-10-CM | POA: Diagnosis not present

## 2015-03-28 NOTE — Patient Instructions (Signed)
Goals to eat 3-4 times/day (about every 3-4 hours) This is to prevent extreme hunger and subsequent over eating  Carrots (1 cup), hummus ( 4 tbsp) and crackers (6-12 crackers) Spaghetti 1/2 chipotle 1/2 bbq from [employer] 2 spoonfuls of peanut butter and handful trail mix or chex mix Bowl cereal Handful nuts  Remember, eating more often speeds up metabolism and decreases fat storage.  Eating less often causes you to keep the energy that you eat.  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 Eating at anything less than 8 is NOT TOO MUCH.  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  Nilam Quakenbush's number is [id number] Text her Saturday during the day to touch based beforehand Aim to eat twice before dinner: 11/12 and 2/3 pm to protect you from being starving and overeating and then being tempted to purge Take back some control from Ed on your birthday.  Birthdays are for FUN!!!!!!!!!!!!!!!!!!!!!!!!!!!!!!!!!!!!!!

## 2015-03-28 NOTE — Progress Notes (Signed)
Patient was seen on 03/28/15 for nutrition counseling pertaining to disordered eating  Appointment start time: 0800  Appointment end time: 0900  Assessment: Brandi Love is very anxious about her upcoming birthday dinner this weekend.   She continues to feel as though she eats way too much, yet dietary recall reveals inadequate intake  24 hour recall:  5 handfuls chips [name] (1/2 bbq, all sides) Other 1/2 bbq  Sunday 5 handfuls chips 1/2 bowl spaghetti with sauce  Estimated energy intake: <1000 kcal  Estimated energy needs: 2000 kcal 250 g carbohydrate 100 g protein 67 g fat    Nutrition Diagnosis: NB-1.5 Disordered eating pattern As related to eating disorder. As evidenced by dietary recall.  Intervention: Nutrition counseling provided.  Recommended eating every 3-5 hours and not going longer than 5 hours.  Recommended challenging Ed thoughts by reviewing food log to see she wasn't stuffed after eating so she didn't eat too much  Discussed birthday dinner this Saturday. Discussed what she should eat, how much, and how to prepare to fight back against Ed thoughts (specifically urge to purge)  Monitoring: patient will follow up in 1 weeks

## 2015-04-05 ENCOUNTER — Encounter: Payer: Medicare Other | Admitting: *Deleted

## 2015-04-05 DIAGNOSIS — N183 Chronic kidney disease, stage 3 unspecified: Secondary | ICD-10-CM

## 2015-04-05 DIAGNOSIS — F509 Eating disorder, unspecified: Secondary | ICD-10-CM

## 2015-04-05 DIAGNOSIS — E119 Type 2 diabetes mellitus without complications: Secondary | ICD-10-CM

## 2015-04-05 NOTE — Progress Notes (Signed)
Patient was seen on 04/05/15 for nutrition counseling pertaining to disordered eating  Appointment start time: 0800  Appointment end time: 0900  Assessment: Brandi Love had a good birthday.  She followed nutrition recommendations and ate 4 times.  She ate to an appropriate level 8 and did not engage in compensatory behaviors.  She struggled though and was very anxious throughout. At the beginning of the week she mostly ate just 2 times, but after Saturday she has eaten 3 times daily.  The recommendation is to eat 4 times.  She still struggles with water consumption.  Takes 3 colace and 1 dulcalax daily She will be with her parents the week of christmas.  Is somewhat nervous, but was successful over Thanksgiving and knows she can get through the holidays.  Colonoscopy January 11 for rectal tear  24 hour recall:  5 handful chips 2 hotdogs  Personal pan pizza and 3 breadsticks   Estimated energy intake: 1600 kcal  Estimated energy needs: 2000 kcal 250 g carbohydrate 100 g protein 67 g fat    Nutrition Diagnosis: NB-1.5 Disordered eating pattern As related to eating disorder. As evidenced by dietary recall.  Intervention: Nutrition counseling provided.  Recommended eating every 3-5 hours and not going longer than 5 hours.  Recommended challenging Ed thoughts by reviewing food log to see she wasn't stuffed after eating so she didn't eat too much Reiterated need for adequate hydration.  Take cup of water everywhere with you and put note "Drink me" on cup Discussed harmful effects of laxative abuse.  Suggested 2 colace, not 3. Reiterated positive messages about recovery through the holidays     Monitoring: patient will follow up in 2 weeks

## 2015-04-19 ENCOUNTER — Ambulatory Visit: Payer: Self-pay | Admitting: *Deleted

## 2015-04-26 ENCOUNTER — Encounter: Payer: Medicare Other | Attending: Emergency Medicine | Admitting: *Deleted

## 2015-04-26 DIAGNOSIS — E119 Type 2 diabetes mellitus without complications: Secondary | ICD-10-CM | POA: Diagnosis not present

## 2015-04-26 DIAGNOSIS — N183 Chronic kidney disease, stage 3 (moderate): Secondary | ICD-10-CM | POA: Diagnosis present

## 2015-04-26 DIAGNOSIS — Z713 Dietary counseling and surveillance: Secondary | ICD-10-CM | POA: Insufficient documentation

## 2015-04-26 DIAGNOSIS — F509 Eating disorder, unspecified: Secondary | ICD-10-CM | POA: Diagnosis not present

## 2015-04-26 NOTE — Progress Notes (Signed)
Patient was seen on 04/05/15 for nutrition counseling pertaining to disordered eating  Appointment start time: 0800  Appointment end time: 0900  Patient was seen on 04/26/15 for nutrition counseling pertaining to disordered eating Assessment: The holidays were fine. She ate more consistently when at her parents' house than she does at home. It was revealed she does not eat with her family.  She eats before they do and then leaves when they come to the table.  She is ashamed to eat in front of others.  Also afraid to drink in front of others Has not been drinking water since being home.  She "forgot."  Colonoscopy January 11 for rectal tear  24 hour recall:  B: large bowl cereal L: swiss steak D: lasagna, french bread S: cake  Estimated energy intake: 1600-1800 kcal  Estimated energy needs: 2000 kcal 250 g carbohydrate 100 g protein 67 g fat    Nutrition Diagnosis: NB-1.5 Disordered eating pattern As related to eating disorder. As evidenced by dietary recall.  Intervention: Nutrition counseling provided.  Recommended eating every 3-5 hours and not going longer than 5 hours.  Recommended challenging Ed thoughts by reviewing food log to see she wasn't stuffed after eating so she didn't eat too much Reiterated need for adequate hydration.  Take cup of water everywhere with you and put note "Drink me" on cup Challenged cognitive distortions.  Discussed what Ed has done for her life (negatively) and how that list can motivate her to make changes  Monitoring: patient will follow up in 2 weeks

## 2015-04-29 ENCOUNTER — Encounter: Payer: Self-pay | Admitting: *Deleted

## 2015-05-04 ENCOUNTER — Ambulatory Visit: Payer: Self-pay | Admitting: *Deleted

## 2015-05-04 ENCOUNTER — Encounter: Payer: Medicare Other | Admitting: *Deleted

## 2015-05-04 DIAGNOSIS — N183 Chronic kidney disease, stage 3 unspecified: Secondary | ICD-10-CM

## 2015-05-04 DIAGNOSIS — E119 Type 2 diabetes mellitus without complications: Secondary | ICD-10-CM

## 2015-05-04 DIAGNOSIS — F509 Eating disorder, unspecified: Secondary | ICD-10-CM

## 2015-05-04 NOTE — Patient Instructions (Addendum)
Set alarm to go off every 2 hours and drink 4-8 oz water or Gatorade Ask doctor about decreasing laxative and colace Look at fullness numbers before writing that you ate too much Review journal to challenge Ed Tell Ed he's a liar!! Tell yourself "it's ok" Treat yourself like you would treat a loved one Think about eating like you do at your parents, but at home

## 2015-05-04 NOTE — Progress Notes (Signed)
Appointment start time: 0930  Appointment end time: 1030  Patient was seen on 05/03/15 for nutrition counseling pertaining to disordered eating  Assessment: Colonoscopy was rescheduled for March due to current bronchitis and sinus infection.  Thinks she has still supposed to take Colasce until then.  Is on course of antibiotics and has had diarrhea x 7 days  Poor fluid intake.  Poor food intake.  Still feels she is eating excessively.  24 hour recall:  15 small meatballs Small bowl guac 1/2 snickers bar 3/4 bowl spaghetti with alfredo  Estimated energy intake: 1600 kcal  Estimated energy needs: 2000 kcal 250 g carbohydrate 100 g protein 67 g fat    Nutrition Diagnosis: NB-1.5 Disordered eating pattern As related to eating disorder. As evidenced by dietary recall.  Intervention: Nutrition counseling provided. Stressed seriousness of needing to stay hydrated while having diarrhea.  Stressed 1 cup water and Gatorade every 2 hours Recommended eating every 3-5 hours and not going longer than 5 hours.  Recommended challenging Ed thoughts by reviewing food log to see she wasn't stuffed after eating so she didn't eat too much Challenged cognitive distortions.    Monitoring: patient will follow up in 1 weeks

## 2015-05-10 ENCOUNTER — Encounter: Payer: Self-pay | Admitting: *Deleted

## 2015-05-10 ENCOUNTER — Encounter: Payer: Medicare Other | Admitting: *Deleted

## 2015-05-10 VITALS — Wt 175.0 lb

## 2015-05-10 DIAGNOSIS — N183 Chronic kidney disease, stage 3 (moderate): Secondary | ICD-10-CM | POA: Diagnosis not present

## 2015-05-10 DIAGNOSIS — F509 Eating disorder, unspecified: Secondary | ICD-10-CM

## 2015-05-10 NOTE — Patient Instructions (Signed)
There is nothing special about your parents' house.  You CAN eat 3 meals at your house too.  Do what you need to do to take care of yourself the way God wants you to Remember, Ed lies. What if you stayed away from Ed as much as you try to stay away from [facility]. Ed is just as evil  Keep thinking about how much food (if it's a level 9 it's too much).   PLEASE PLEASE PLEASE drink your water.  Look at your log and drink 1 full cup before bed  Breakfast:   Cereal  [employer] Instant Breakfast  Toast with peanut butter  English muffin with peanut butter  Lunch:  Soup  1/2 Chipotle  [employer]  Sub[employer]  [employer]  [employer]  Dinner:   Spaghetti with meatballs  1/2 [place]  [employer]  [employer]  [employer]

## 2015-05-10 NOTE — Progress Notes (Signed)
Appointment start time: 0800  Appointment end time: 0900  Patient was seen on 05/10/15 for nutrition counseling pertaining to disordered eating  Assessment: Has not been drinking her water.  States diarrhea is better.  Changed antibiotic.     24 hour recall:  15 small meatballs Small bowl guac 1/2 snickers bar 3/4 bowl spaghetti with alfredo  Estimated energy intake: 1600 kcal  Estimated energy needs: 2000 kcal 250 g carbohydrate 100 g protein 67 g fat   Nutrition Diagnosis: NB-1.5 Disordered eating pattern As related to eating disorder. As evidenced by dietary recall.  Intervention: Nutrition counseling provided. Stressed seriousness of needing to stay hydrated with kidney disease.  Advised drinking 1 full cup before bed.  She is reluctant as she will need to use the bathroom at night.  Suggested this might serve as motivation for her drink more during the day.  Challenged cognitive distortions and Ed messages.  Discussed need for balanced meals with protein and carbohydrate in order to appreciate internal hunger/fullness cues.  Encouraged her to follow those cues: eat mindfully and don't wait until starving.  Advised 3 meals/day and snacks, in between (eating every 3-5 hours).  Discussed how her belief in God could be used in recovery.  Challenged her to eat more balanced at home since she's able to do so at her parents' house .  Discussed simple foods she could have in her own home since she doesn't like to cook   Monitoring: patient will follow up in 1 weeks

## 2015-05-17 ENCOUNTER — Encounter: Payer: Medicare Other | Admitting: *Deleted

## 2015-05-17 DIAGNOSIS — N183 Chronic kidney disease, stage 3 (moderate): Secondary | ICD-10-CM | POA: Diagnosis not present

## 2015-05-17 DIAGNOSIS — F509 Eating disorder, unspecified: Secondary | ICD-10-CM

## 2015-05-17 NOTE — Patient Instructions (Signed)
Increase fiber foods Increase water Keep challenging Ed Aim to eat 3 times/day  Breakfast:  Ce[employer] Instant Breakfast Toast with peanut butter English muffin with peanut butter  Lunch: Soup 1/2 [employer] Subway [employer]  Dinner:  Spaghetti with meatballs 1/2 [place] [employer]

## 2015-05-17 NOTE — Progress Notes (Signed)
Appointment start time: 0800  Appointment end time: 0900  Patient was seen on 05/10/15 for nutrition counseling pertaining to disordered eating  Assessment: Brandi Love reports she heard back from PCP who recommended staying on 2 colase/day in addition to her laxative abuse until colonoscopy next month.  She has not made any significant changes to eating nor drinking.    24 hour recall:  5 handfuls chex mix Small bowl guac 15 meat balls  Estimated energy intake: 1600 kcal  Estimated energy needs: 2000 kcal 250 g carbohydrate 100 g protein 67 g fat   Nutrition Diagnosis: NB-1.5 Disordered eating pattern As related to eating disorder. As evidenced by dietary recall.  Intervention: Nutrition counseling provided. Stressed need for hydration.  Again. Advised 3/4 cup every hour she's awake (10-12 hours).  Challenged Ed voice that she's not worth it.  Challenged perfection.  Challenged what is normal.  Discussed need for fiber.  She is very picky, but did not argue with increasing nuts.  Advised challenging Ed at home.  Advised 3 meals, not just 3 snacks/day.  Monitoring: patient will follow up in 1 weeks.  Would like to talk about eating out

## 2015-05-24 ENCOUNTER — Encounter: Payer: Medicare Other | Attending: Emergency Medicine | Admitting: *Deleted

## 2015-05-24 DIAGNOSIS — N183 Chronic kidney disease, stage 3 (moderate): Secondary | ICD-10-CM | POA: Insufficient documentation

## 2015-05-24 DIAGNOSIS — E119 Type 2 diabetes mellitus without complications: Secondary | ICD-10-CM | POA: Insufficient documentation

## 2015-05-24 DIAGNOSIS — F509 Eating disorder, unspecified: Secondary | ICD-10-CM | POA: Insufficient documentation

## 2015-05-24 DIAGNOSIS — Z713 Dietary counseling and surveillance: Secondary | ICD-10-CM | POA: Diagnosis not present

## 2015-05-24 NOTE — Progress Notes (Signed)
Appointment start time: 0800  Appointment end time: 0900  Patient was seen on 05/24/15 for nutrition counseling pertaining to disordered eating  Assessment: States she was eating well until a few days ago and then started restricting.  States she's not sure why she restricted.   Started using sugar free mix in packets.  One day she drank 8 bottles (the packets have caffeine in them) Bought some nuts and whole grain english muffin and whole grain spaghetti and carrots   24 hour recall:  English muffin with butter 15 meatballs 1/2 medium bowl spaghetti with alfredo  Estimated energy intake: 1100 kcal  Estimated energy needs: 2000 kcal 250 g carbohydrate 100 g protein 67 g fat   Nutrition Diagnosis: NB-1.5 Disordered eating pattern As related to eating disorder. As evidenced by dietary recall.  Intervention: Nutrition counseling provided.   Monitoring: patient will follow up in 1 weeks.  Would like to talk about eating out

## 2015-05-24 NOTE — Patient Instructions (Addendum)
Try to limit the flavor packets to 2/day Use fruit instead to flavor the water Consider [place] sparkling water  Try 3 handfuls chex mix, 1 handful cashews and 1 piece of fruit (or carrots) instead of the 5 handfuls of chex mix Eat the fiber foods you bought Consider peanut butter or an egg with your english muffin instead of butter for more balance

## 2015-05-31 ENCOUNTER — Encounter: Payer: Medicare Other | Admitting: *Deleted

## 2015-05-31 ENCOUNTER — Encounter: Payer: Self-pay | Admitting: *Deleted

## 2015-05-31 DIAGNOSIS — F509 Eating disorder, unspecified: Secondary | ICD-10-CM

## 2015-05-31 DIAGNOSIS — N183 Chronic kidney disease, stage 3 (moderate): Secondary | ICD-10-CM

## 2015-05-31 DIAGNOSIS — E119 Type 2 diabetes mellitus without complications: Secondary | ICD-10-CM

## 2015-05-31 NOTE — Progress Notes (Signed)
Appointment start time: 0800  Appointment end time: 0900  Patient was seen on 05/31/15 for nutrition counseling pertaining to disordered eating  Assessment: Is in process of changing ACT team and the nurse forgot some of her medicines.  "everything sucked."   States she is restricting food and water States she has been sleeping more this week.  States she is more depressed this week.  Has talked to Nora about this.  Got into a ~argument about food/health Recent labs indicate very elevated triglycerides and elevated cholesterol    24 hour recall:  English muffin with butter 15 meatballs 1/2 medium bowl spaghetti with alfredo  Estimated energy intake: 1100 kcal  Estimated energy needs: 2000 kcal 250 g carbohydrate 100 g protein 67 g fat   Nutrition Diagnosis: NB-1.5 Disordered eating pattern As related to eating disorder. As evidenced by dietary recall.  Intervention: Nutrition counseling provided. Challenged cognitive distortions about food and weight.  Discussed Embrace documentary and "normal" weight and "normal eating." Discussed recent lab results and her multiple chronic health challenges and how improved nutrition would improve those outcomes.  Advised increased fiber from whole grains, fruits, and vegetables.  Advised increased water and slight decrease in fats  Your bloodwork can be improved with more fruits and vegetables  Buy some pineapple and 1 thing of strawberries  Eat 1 of those each day  Eat carrots and hummus each day Consider 1/2 avocado Drink your water  Monitoring: patient will follow up in 1 weeks.  Would like to talk about eating out

## 2015-05-31 NOTE — Patient Instructions (Signed)
Your bloodwork can be improved with more fruits and vegetables  Buy some pineapple and 1 thing of strawberries  Eat 1 of those each day  Eat carrots and hummus each day Consider 1/2 avocado Drink your water

## 2015-06-07 ENCOUNTER — Encounter: Payer: Medicare Other | Admitting: *Deleted

## 2015-06-07 ENCOUNTER — Encounter: Payer: Self-pay | Admitting: *Deleted

## 2015-06-07 DIAGNOSIS — F509 Eating disorder, unspecified: Secondary | ICD-10-CM

## 2015-06-07 DIAGNOSIS — N183 Chronic kidney disease, stage 3 (moderate): Secondary | ICD-10-CM

## 2015-06-07 DIAGNOSIS — E119 Type 2 diabetes mellitus without complications: Secondary | ICD-10-CM

## 2015-06-07 NOTE — Patient Instructions (Signed)
Aim to eat 3 times/day Breakfast:  [employer] Instant Breakfast Toast with peanut butter English muffin with peanut butter  Lunch: Soup 1/2 [employer] Subway [employer]  Snack: Fruit or vegetables or yogurt or crackers  Dinner:  Spaghetti with meatballs 1/2 [employer]

## 2015-06-07 NOTE — Progress Notes (Signed)
Appointment start time: 0800  Appointment end time: 0900  Patient was seen on 06/07/15 for nutrition counseling pertaining to disordered eating  Assessment: Feels like she's restricting.  Dietary recall reveals excessive fat   24 hour recall:  English muffin with butter 1 avocado 1/2 snickers 2 avocado Spaghetti with alfredo sauce  Estimated energy intake: 2000 kcal  Estimated energy needs: 2000 kcal 250 g carbohydrate 100 g protein 67 g fat   Nutrition Diagnosis: NB-1.5 Disordered eating pattern As related to eating disorder. As evidenced by dietary recall.  Intervention: Nutrition counseling provided. Discussed Embrace documentary and how to honor her own body.  Used Biblical principles to foster body positivity.  Discussed how eating nutritious foods is honoring her body, drinking water, etc.  Stressed need for adequate whole grains, fruits, and vegetables, as well as "fun foods."   Again suggested cutting back on her fat intake.  Discussed more balanced meals that she doesn't have to cook   Monitoring: patient will follow up in 1 weeks.

## 2015-06-14 ENCOUNTER — Encounter: Payer: Medicare Other | Admitting: *Deleted

## 2015-06-14 DIAGNOSIS — N183 Chronic kidney disease, stage 3 (moderate): Secondary | ICD-10-CM

## 2015-06-14 DIAGNOSIS — E119 Type 2 diabetes mellitus without complications: Secondary | ICD-10-CM

## 2015-06-14 DIAGNOSIS — F509 Eating disorder, unspecified: Secondary | ICD-10-CM

## 2015-06-14 NOTE — Progress Notes (Signed)
Appointment start time: 0830  Appointment end time: 0900  Patient was seen on 06/15/15 for nutrition counseling pertaining to disordered eating  Assessment: Feels like she did ok with her eating.  Most days ate 3-4 times.  Is drinking more water consistently.  Still struggling with poor sleep hygiene. Is eating 2-3 avocados/day, plus snickers, plus chex mix and alfredo sauce. Diet is very high in fat and almost absent of other nutrients.     Estimated energy intake: 2000 kcal  Estimated energy needs: 2000 kcal 250 g carbohydrate 100 g protein 67 g fat   Nutrition Diagnosis: NB-1.5 Disordered eating pattern As related to eating disorder. As evidenced by dietary recall.  Intervention: Nutrition counseling provided. Reiterated messages about improving blood work through nutrition.    Shift balance to include more fruit, vegetables Don't have to give up the avocados, snickers, chex mix- just maybe have to cut back so you have more room in your stomach for the foods that will help your blood work DVR your favorite shows so you can go to bed by 11 and try to stay awake during the day- watch Embrace Follow [employer] on Facebook   Monitoring: patient will follow up in 1 weeks.

## 2015-06-14 NOTE — Patient Instructions (Signed)
Shift balance to include more fruit, vegetables Don't have to give up the avocados, snickers, chex mix- just maybe have to cut back so you have more room in your stomach for the foods that will help your blood work DVR your favorite shows so you can go to bed by 11 and try to stay awake during the day- watch Embrace Follow [employer] on [employer]

## 2015-06-21 ENCOUNTER — Encounter: Payer: Self-pay | Admitting: *Deleted

## 2015-06-21 ENCOUNTER — Encounter: Payer: Medicare Other | Attending: Emergency Medicine | Admitting: *Deleted

## 2015-06-21 DIAGNOSIS — F509 Eating disorder, unspecified: Secondary | ICD-10-CM

## 2015-06-21 DIAGNOSIS — N183 Chronic kidney disease, stage 3 (moderate): Secondary | ICD-10-CM | POA: Insufficient documentation

## 2015-06-21 DIAGNOSIS — Z713 Dietary counseling and surveillance: Secondary | ICD-10-CM | POA: Insufficient documentation

## 2015-06-21 DIAGNOSIS — E119 Type 2 diabetes mellitus without complications: Secondary | ICD-10-CM | POA: Insufficient documentation

## 2015-06-21 HISTORY — PX: COLONOSCOPY: SHX174

## 2015-06-21 NOTE — Progress Notes (Signed)
Appointment start time: 0800  Appointment end time: 0900  Patient was seen on 06/21/15 for nutrition counseling pertaining to disordered eating  Assessment: Feels like she was restricting this week.  Received the news her grandfather is dying of CHF and that has affected her eating.  She's eating 3 times a day instead of 4  Estimated energy intake: 2000 kcal  Estimated energy needs: 2000 kcal 250 g carbohydrate 100 g protein 67 g fat   Nutrition Diagnosis: NB-1.5 Disordered eating pattern As related to eating disorder. As evidenced by dietary recall.  Intervention: Nutrition counseling provided. When you are struggling with thoughts or emotions and wanting to engage in disordered behaviors do this instead:  Color  Get on Man has taken away church, walks, school, work, eating out, going out with friends, doing "normal stuff", eating without guilt.  Take back some of that from him!!  Like maybe going to church  And eat out.  Where do you want to go when and we'll talk about it  You got through your birthday fine.  So you cant do it again :-) Courage is having fear and doing it anyway  In case you attend a funeral before next time, use MyPlate as a guide   Monitoring: patient will follow up in 1 weeks.

## 2015-06-21 NOTE — Patient Instructions (Signed)
When you are struggling with thoughts or emotions and wanting to engage in disordered behaviors do this instead:  Color  Get on [place] has taken away church, walks, school, work, eating out, going out with friends, doing "normal stuff", eating without guilt.  Take back some of that from him!!  Like maybe going to church  And eat out.  Where do you want to go when and we'll talk about it  You got through your birthday fine.  So you cant do it again :-) Courage is having fear and doing it anyway  In case you attend a funeral before next time, use MyPlate as a guide

## 2015-06-28 ENCOUNTER — Ambulatory Visit: Payer: Self-pay | Admitting: *Deleted

## 2015-07-05 ENCOUNTER — Encounter: Payer: Medicare Other | Admitting: *Deleted

## 2015-07-05 ENCOUNTER — Encounter: Payer: Self-pay | Admitting: *Deleted

## 2015-07-05 DIAGNOSIS — N183 Chronic kidney disease, stage 3 (moderate): Secondary | ICD-10-CM | POA: Diagnosis not present

## 2015-07-05 DIAGNOSIS — E1122 Type 2 diabetes mellitus with diabetic chronic kidney disease: Secondary | ICD-10-CM

## 2015-07-05 DIAGNOSIS — F509 Eating disorder, unspecified: Secondary | ICD-10-CM

## 2015-07-05 NOTE — Progress Notes (Signed)
Appointment start time: 0800  Appointment end time: 0900  Patient was seen on 07/05/15 for nutrition counseling pertaining to disordered eating  Assessment: Is sleeping even more Ate 3 meals and 2 snacks when she was with her parents on Friday and Saturday Wednesday and Thursday she ate 3 times.  Now that she's back home, she back to eating her safe foods of chex mix and avocados. States she doesn't know how to cook    Estimated energy needs: 1600-2000 kcal 250 g carbohydrate 100 g protein 67 g fat   Nutrition Diagnosis: NB-1.5 Disordered eating pattern As related to eating disorder. As evidenced by dietary recall.  Intervention: Nutrition counseling provided.  One thing you can do this week is color instead of sleeping.  Continue to rate hunger and fullness cues Remember that eating enough or having snacks will help prevent overeating or binging later on!!!!!!! Only you know when you're full, not your mom (or me) You CAN prepare your own foods at home.   You CAN pour bowl or cereal or make a sandwich or heat up soup or slice chicken off the rotisserie Try to have 1 meal at home each day and 1 meal take out and 1 meal safe foods Keep drinking water  Monitoring: patient will follow up in 1 weeks.

## 2015-07-05 NOTE — Patient Instructions (Addendum)
Who is living your life?  Not Allise  One thing you can do this week is color instead of sleeping.  Continue to rate hunger and fullness cues Remember that eating enough or having snacks will help prevent overeating or binging later on!!!!!!! Only you know when you're full, not your mom (or me) You CAN prepare your own foods at home.   You CAN pour bowl or cereal or make a sandwich or heat up soup or slice chicken off the rotisserie Try to have 1 meal at home each day and 1 meal take out and 1 meal safe foods Keep drinking water

## 2015-07-12 ENCOUNTER — Encounter: Payer: Medicare Other | Admitting: *Deleted

## 2015-07-12 ENCOUNTER — Encounter: Payer: Self-pay | Admitting: *Deleted

## 2015-07-12 DIAGNOSIS — N183 Chronic kidney disease, stage 3 (moderate): Secondary | ICD-10-CM | POA: Diagnosis not present

## 2015-07-12 DIAGNOSIS — F509 Eating disorder, unspecified: Secondary | ICD-10-CM

## 2015-07-12 DIAGNOSIS — E119 Type 2 diabetes mellitus without complications: Secondary | ICD-10-CM

## 2015-07-12 NOTE — Progress Notes (Signed)
Appointment start time: 0800  Appointment end time: 0900  Patient was seen on 07/12/15 for nutrition counseling pertaining to disordered eating  Assessment: Started resting again.  Did make a sandwich for the first time and it was good.  Has rotisserie chicken at home Ed voice has been really loud.  Mom also makes routine disparaging remarks about her weight and health and eating.  Eniyah struggling with urinary incontinance and mom recommended weight loss.  MD theorizes her complicated hysterectomy is to blame to the bladder issues.      Estimated energy needs: 1600-2000 kcal 250 g carbohydrate 100 g protein 67 g fat   Nutrition Diagnosis: NB-1.5 Disordered eating pattern As related to eating disorder. As evidenced by dietary recall.  Intervention: Nutrition counseling provided. Discussed fighting back against Ed by challenging what he says.  Loaned "Life without Ed" Discussed MyPlate recommendations for balanced eating  Goals: Continue to make sandwiches Use rotisserie chicken in soup Use rotisserie chicken, cous cous, and green beans  Monitoring: patient will follow up in 1 weeks.

## 2015-07-16 ENCOUNTER — Encounter (HOSPITAL_COMMUNITY): Payer: Self-pay

## 2015-07-16 DIAGNOSIS — Z8742 Personal history of other diseases of the female genital tract: Secondary | ICD-10-CM | POA: Diagnosis not present

## 2015-07-16 DIAGNOSIS — M545 Low back pain: Secondary | ICD-10-CM | POA: Insufficient documentation

## 2015-07-16 DIAGNOSIS — E039 Hypothyroidism, unspecified: Secondary | ICD-10-CM | POA: Diagnosis not present

## 2015-07-16 DIAGNOSIS — E119 Type 2 diabetes mellitus without complications: Secondary | ICD-10-CM | POA: Diagnosis not present

## 2015-07-16 DIAGNOSIS — Z792 Long term (current) use of antibiotics: Secondary | ICD-10-CM | POA: Diagnosis not present

## 2015-07-16 DIAGNOSIS — Z79899 Other long term (current) drug therapy: Secondary | ICD-10-CM | POA: Diagnosis not present

## 2015-07-16 DIAGNOSIS — K219 Gastro-esophageal reflux disease without esophagitis: Secondary | ICD-10-CM | POA: Diagnosis not present

## 2015-07-16 DIAGNOSIS — R34 Anuria and oliguria: Secondary | ICD-10-CM | POA: Diagnosis not present

## 2015-07-16 DIAGNOSIS — Z862 Personal history of diseases of the blood and blood-forming organs and certain disorders involving the immune mechanism: Secondary | ICD-10-CM | POA: Diagnosis not present

## 2015-07-16 DIAGNOSIS — N189 Chronic kidney disease, unspecified: Secondary | ICD-10-CM | POA: Diagnosis not present

## 2015-07-16 DIAGNOSIS — H9190 Unspecified hearing loss, unspecified ear: Secondary | ICD-10-CM | POA: Insufficient documentation

## 2015-07-16 DIAGNOSIS — E785 Hyperlipidemia, unspecified: Secondary | ICD-10-CM | POA: Insufficient documentation

## 2015-07-16 DIAGNOSIS — G43909 Migraine, unspecified, not intractable, without status migrainosus: Secondary | ICD-10-CM | POA: Insufficient documentation

## 2015-07-16 DIAGNOSIS — R000 Tachycardia, unspecified: Secondary | ICD-10-CM | POA: Diagnosis not present

## 2015-07-16 DIAGNOSIS — I129 Hypertensive chronic kidney disease with stage 1 through stage 4 chronic kidney disease, or unspecified chronic kidney disease: Secondary | ICD-10-CM | POA: Insufficient documentation

## 2015-07-16 DIAGNOSIS — F419 Anxiety disorder, unspecified: Secondary | ICD-10-CM | POA: Diagnosis not present

## 2015-07-16 DIAGNOSIS — Z3202 Encounter for pregnancy test, result negative: Secondary | ICD-10-CM | POA: Diagnosis not present

## 2015-07-16 DIAGNOSIS — F329 Major depressive disorder, single episode, unspecified: Secondary | ICD-10-CM | POA: Insufficient documentation

## 2015-07-16 DIAGNOSIS — Z86711 Personal history of pulmonary embolism: Secondary | ICD-10-CM | POA: Diagnosis not present

## 2015-07-16 NOTE — ED Notes (Signed)
Onset today low mid back pain and swelling.  Pt has been in bed all day, took Tylenol with no relief.

## 2015-07-17 ENCOUNTER — Emergency Department (HOSPITAL_COMMUNITY)
Admission: EM | Admit: 2015-07-17 | Discharge: 2015-07-17 | Disposition: A | Discharge status: Home / Self Care | Payer: Medicare Other | Attending: Physician Assistant | Admitting: Physician Assistant

## 2015-07-17 DIAGNOSIS — M545 Low back pain, unspecified: Secondary | ICD-10-CM

## 2015-07-17 LAB — URINALYSIS, ROUTINE W REFLEX MICROSCOPIC
BILIRUBIN URINE: NEGATIVE
Glucose, UA: NEGATIVE mg/dL
HGB URINE DIPSTICK: NEGATIVE
Ketones, ur: NEGATIVE mg/dL
LEUKOCYTES UA: NEGATIVE
NITRITE: NEGATIVE
Protein, ur: NEGATIVE mg/dL
Specific Gravity, Urine: 1.015 (ref 1.005–1.030)
pH: 6 (ref 5.0–8.0)

## 2015-07-17 LAB — PREGNANCY, URINE: PREG TEST UR: NEGATIVE

## 2015-07-17 MED ORDER — CYCLOBENZAPRINE HCL 10 MG PO TABS: 10.0000 mg | ORAL_TABLET | Freq: Two times a day (BID) | ORAL | Status: DC | PRN

## 2015-07-17 MED ORDER — OXYCODONE-ACETAMINOPHEN 5-325 MG PO TABS
1.0000 | ORAL_TABLET | Freq: Four times a day (QID) | ORAL | Status: DC | PRN
Start: 2015-07-17 — End: 2016-09-27

## 2015-07-17 MED ORDER — METHOCARBAMOL 500 MG PO TABS
500.0000 mg | ORAL_TABLET | Freq: Once | ORAL | Status: AC
  Administered 2015-07-17: 500 mg via ORAL
  Filled 2015-07-17: qty 1

## 2015-07-17 NOTE — ED Provider Notes (Signed)
CSN: [id number]     Arrival date & time 07/16/15  2246 History  By signing my name below, I, Brandi Love, attest that this documentation has been prepared under the direction and in the presence of [name], MD. Electronically Signed: Irene Love, ED Scribe. 07/17/2015. 1:59 AM.  Chief Complaint  Patient presents with  . Back Pain   The history is provided by the patient. No language interpreter was used.  HPI Comments: Brandi Love is a 40 y.o. Female with a hx of eating disorder, PE, seizures, anemia, tachycardia, HTN, chronic renal insufficiency, endometriosis, hypothyroidism, and DM who presents to the Emergency Department complaining of lower middle back pain and swelling onset earlier today. Pt reports being in bed all day and using Tylenol and IcyHot to no relief. She reports that she has not urinated much today. Pt states that she has been carrying heavy backpacks around. She reports worsening pain with position changes. She states that she cannot take ibuprofen due to kidney failure. She was seen by her PCP and states that her BNP was 2.08. She denies abdominal pain, fever, or dysuria.   Past Medical History  Diagnosis Date  . Eating disorder   . Anxiety   . Depression   . OCD (obsessive compulsive disorder)   . Pulmonary embolism ([place])   . Seizures ([place])   . Anemia   . Tachycardia   . Hypertension   . Chronic renal insufficiency   . 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 Endometriosis   . GERD (gastroesophageal reflux disease)   . Migraine   . Deafness   . Diabetes mellitus without complication ([place])   . Hyperlipidemia    Past Surgical History  Procedure Laterality Date  . Cochlear implant    . Abdominal hysterectomy      'except for right ovary'  . Eye surgery Left 1980  . Eye surgery Right 1990  . Breast lumpectomy Left 2000    x2  . Breast lumpectomy Right 2002    benign  . Laparoscopic abdominal exploration  2001 and 2002     for endometriosis   Family History  Problem Relation Age of Onset  . Cancer Father   . Hyperlipidemia Father   . Osteoarthritis Mother   . Hyperlipidemia Mother    Social History  Substance Use Topics  . Smoking status: Never Smoker   . Smokeless tobacco: Never Used  . Alcohol Use: No   OB History    No data available     Review of Systems  Constitutional: Negative for fever.  Respiratory: Negative for cough.   Genitourinary: Positive for decreased urine volume. Negative for dysuria.  Musculoskeletal: Positive for back pain.  All other systems reviewed and are negative.  Allergies  Iodinated diagnostic agents; Dhea; Risperidone and related; Abilify; Dihydroergotamine; Doxycycline; Entex lq; Erythromycin; Lactose intolerance (gi); Nsaids; and Tape  Home Medications   Prior to Admission medications   Medication Sig Start Date End Date Taking? Authorizing Provider  buPROPion (WELLBUTRIN XL) 300 MG 24 hr tablet Take 300 mg by mouth daily.    Historical Provider, MD  cefdinir (OMNICEF) 300 MG capsule Take 300 mg by mouth 2 (two) times daily. Reported on 05/17/2015    Historical Provider, MD  Desvenlafaxine Succinate ER (PRISTIQ) 25 MG TB24 Take 25 mg by mouth daily.    Historical Provider, MD  fenofibrate (TRICOR) 145 MG tablet Take 145 mg by mouth daily.    Historical  Provider, MD  haloperidol (HALDOL) 5 MG tablet Take 5 mg by mouth 3 (three) times daily.    Historical Provider, MD  lamoTRIgine (LAMICTAL) 200 MG tablet Take 200 mg by mouth daily.    Historical Provider, MD  levothyroxine (SYNTHROID, LEVOTHROID) 150 MCG tablet Take 150 mcg by mouth daily.    Historical Provider, MD  LORazepam (ATIVAN) 0.5 MG tablet Take 0.5 mg by mouth 2 (two) times daily.     Historical Provider, MD  magnesium oxide (MAG-OX) 400 MG tablet Take 400 mg by mouth 2 (two) times daily.     Historical Provider, MD  Melatonin 3 MG TABS Take 3 mg by mouth at bedtime.     Historical Provider, MD   metoprolol succinate (TOPROL-XL) 50 MG 24 hr tablet Take 50 mg by mouth daily.     Historical Provider, MD  Multiple Vitamin (MULTIVITAMIN WITH MINERALS) TABS Take 1 tablet by mouth daily.    Historical Provider, MD  omega-3 acid ethyl esters (LOVAZA) 1 G capsule Take 2 g by mouth 2 (two) times daily.    Historical Provider, MD  omeprazole (PRILOSEC) 20 MG capsule Take 20 mg by mouth daily.    Historical Provider, MD  ondansetron (ZOFRAN) 4 MG tablet Take 4 mg by mouth every 8 (eight) hours as needed for nausea.    Historical Provider, MD  pravastatin (PRAVACHOL) 20 MG tablet Take 20 mg by mouth every evening.    Historical Provider, MD  pregabalin (LYRICA) 50 MG capsule Take 75 mg by mouth every 8 (eight) hours.     Historical Provider, MD  promethazine (PHENERGAN) 25 MG tablet Take 1 tablet (25 mg total) by mouth every 6 (six) hours as needed for nausea or vomiting. Patient not taking: Reported on 02/08/2015 06/24/14   Alvina Chou, PA-C  QUEtiapine (SEROQUEL) 200 MG tablet Take 100-400 mg by mouth 2 (two) times daily. 100 mg at noon and 400 mg at bedtime    Historical Provider, MD  SUMAtriptan (IMITREX) 100 MG tablet Take 100 mg by mouth every 2 (two) hours as needed for migraine.    Historical Provider, MD  topiramate (TOPAMAX) 50 MG tablet Take 50 mg by mouth 2 (two) times daily.     Historical Provider, MD  Vitamin D, Ergocalciferol, (DRISDOL) 50000 UNITS CAPS Take 50,000 Units by mouth every 7 (seven) days. On Sunday    Historical Provider, MD   BP 126/89 mmHg  Pulse 92  Temp(Src) 98.5 F (36.9 C) (Oral)  Resp 16  Ht 5\' 8"  (1.727 m)  Wt 179 lb (81.194 kg)  BMI 27.22 kg/m2  SpO2 95% Physical Exam  Constitutional: She is oriented to person, place, and time. She appears well-developed and well-nourished.  HENT:  Head: Normocephalic and atraumatic.  Cochlear implant in the left ear  Eyes: EOM are normal. Pupils are equal, round, and reactive to light.  Neck: Normal range of  motion. Neck supple.  Cardiovascular: Normal rate, regular rhythm and normal heart sounds.  Exam reveals no gallop and no friction rub.   No murmur heard. Pulmonary/Chest: Effort normal and breath sounds normal. She has no wheezes.  Abdominal: Soft. There is no tenderness.  Musculoskeletal: Normal range of motion.  Tenderness over the back with increased pain with movement  Neurological: She is alert and oriented to person, place, and time.  Skin: Skin is warm and dry.  Psychiatric: She has a normal mood and affect. Her behavior is normal.  Nursing note and vitals reviewed.  ED Course  Procedures (including critical care time) DIAGNOSTIC STUDIES: Oxygen Saturation is 95% on RA, normal by my interpretation.    COORDINATION OF CARE: 1:57 AM-Discussed treatment plan which includes UA, muscle relaxants and follow up with PCP with pt at bedside and pt agreed to plan.   Labs Review Labs Reviewed - No data to display  Imaging Review No results found. I have personally reviewed and evaluated these images and lab results as part of my medical decision-making.   EKG Interpretation None      MDM   Final diagnoses:  None    Patient is a 40 year old female with history of depression and OCD seizures anxiety. She is presenting with back pain that started today. She reports the back pain is worse with movement. She's been hwearing a heavy backpack today. She notes some pain to palpation in her lower back and her paraspinal muscles. Patient reports she has chronic renal insufficiency. We will get a UA to make sure patient does not have urinary tract infection. We'll treat with Robaxin for help at home. Patient appears very well and has normal vital signs otherwise.  Do not suspect renal pathology given these findings, more consistent with msk pain. No trauma.    I personally performed the services described in this documentation, which was scribed in my presence. The recorded information  has been reviewed and is accurate.      [name], MD 07/17/15 [phone]

## 2015-07-17 NOTE — ED Notes (Signed)
Patient left at this time with all belongings. 

## 2015-07-17 NOTE — Discharge Instructions (Signed)
Please return with any ffevers, or other concerns.   Back Exercises The following exercises strengthen the muscles that help to support the back. They also help to keep the lower back flexible. Doing these exercises can help to prevent back pain or lessen existing pain. If you have back pain or discomfort, try doing these exercises 2-3 times each day or as told by your health care provider. When the pain goes away, do them once each day, but increase the number of times that you repeat the steps for each exercise (do more repetitions). If you do not have back pain or discomfort, do these exercises once each day or as told by your health care provider. EXERCISES Single Knee to Chest Repeat these steps 3-5 times for each leg:  Lie on your back on a firm bed or the floor with your legs extended.  Bring one knee to your chest. Your other leg should stay extended and in contact with the floor.  Hold your knee in place by grabbing your knee or thigh.  Pull on your knee until you feel a gentle stretch in your lower back.  Hold the stretch for 10-30 seconds.  Slowly release and straighten your leg. Pelvic Tilt Repeat these steps 5-10 times:  Lie on your back on a firm bed or the floor with your legs extended.  Bend your knees so they are pointing toward the ceiling and your feet are flat on the floor.  Tighten your lower abdominal muscles to press your lower back against the floor. This motion will tilt your pelvis so your tailbone points up toward the ceiling instead of pointing to your feet or the floor.  With gentle tension and even breathing, hold this position for 5-10 seconds. Cat-Cow Repeat these steps until your lower back becomes more flexible:  Get into a hands-and-knees position on a firm surface. Keep your hands under your shoulders, and keep your knees under your hips. You may place padding under your knees for comfort.  Let your head hang down, and point your tailbone toward  the floor so your lower back becomes rounded like the back of a cat.  Hold this position for 5 seconds.  Slowly lift your head and point your tailbone up toward the ceiling so your back forms a sagging arch like the back of a cow.  Hold this position for 5 seconds. Press-Ups Repeat these steps 5-10 times:  Lie on your abdomen (face-down) on the floor.  Place your palms near your head, about shoulder-width apart.  While you keep your back as relaxed as possible and keep your hips on the floor, slowly straighten your arms to raise the top half of your body and lift your shoulders. Do not use your back muscles to raise your upper torso. You may adjust the placement of your hands to make yourself more comfortable.  Hold this position for 5 seconds while you keep your back relaxed.  Slowly return to lying flat on the floor. Bridges Repeat these steps 10 times: 1. Lie on your back on a firm surface. 2. Bend your knees so they are pointing toward the ceiling and your feet are flat on the floor. 3. Tighten your buttocks muscles and lift your buttocks off of the floor until your waist is at almost the same height as your knees. You should feel the muscles working in your buttocks and the back of your thighs. If you do not feel these muscles, slide your feet 1-2 inches farther  away from your buttocks. 4. Hold this position for 3-5 seconds. 5. Slowly lower your hips to the starting position, and allow your buttocks muscles to relax completely. If this exercise is too easy, try doing it with your arms crossed over your chest. Abdominal Crunches Repeat these steps 5-10 times: 1. Lie on your back on a firm bed or the floor with your legs extended. 2. Bend your knees so they are pointing toward the ceiling and your feet are flat on the floor. 3. Cross your arms over your chest. 4. Tip your chin slightly toward your chest without bending your neck. 5. Tighten your abdominal muscles and slowly raise  your trunk (torso) high enough to lift your shoulder blades a tiny bit off of the floor. Avoid raising your torso higher than that, because it can put too much stress on your low back and it does not help to strengthen your abdominal muscles. 6. Slowly return to your starting position. Back Lifts Repeat these steps 5-10 times: 1. Lie on your abdomen (face-down) with your arms at your sides, and rest your forehead on the floor. 2. Tighten the muscles in your legs and your buttocks. 3. Slowly lift your chest off of the floor while you keep your hips pressed to the floor. Keep the back of your head in line with the curve in your back. Your eyes should be looking at the floor. 4. Hold this position for 3-5 seconds. 5. Slowly return to your starting position. SEEK MEDICAL CARE IF:  Your back pain or discomfort gets much worse when you do an exercise.  Your back pain or discomfort does not lessen within 2 hours after you exercise. If you have any of these problems, stop doing these exercises right away. Do not do them again unless your health care provider says that you can. SEEK IMMEDIATE MEDICAL CARE IF:  You develop sudden, severe back pain. If this happens, stop doing the exercises right away. Do not do them again unless your health care provider says that you can.   This information is not intended to replace advice given to you by your health care provider. Make sure you discuss any questions you have with your health care provider.   Document Released: 05/16/2004 Document Revised: 12/28/2014 Document Reviewed: 06/02/2014 Elsevier Interactive Patient Education 2016 [employer]  Back Pain, Adult Back pain is very common in adults.The cause of back pain is rarely dangerous and the pain often gets better over time.The cause of your back pain may not be known. Some common causes of back pain include:  Strain of the muscles or ligaments supporting the spine.  Wear and tear (degeneration)  of the spinal disks.  Arthritis.  Direct injury to the back. For many people, back pain may return. Since back pain is rarely dangerous, most people can learn to manage this condition on their own. HOME CARE INSTRUCTIONS Watch your back pain for any changes. The following actions may help to lessen any discomfort you are feeling:  Remain active. It is stressful on your back to sit or stand in one place for long periods of time. Do not sit, drive, or stand in one place for more than 30 minutes at a time. Take short walks on even surfaces as soon as you are able.Try to increase the length of time you walk each day.  Exercise regularly as directed by your health care provider. Exercise helps your back heal faster. It also helps avoid future injury by keeping  your muscles strong and flexible.  Do not stay in bed.Resting more than 1-2 days can delay your recovery.  Pay attention to your body when you bend and lift. The most comfortable positions are those that put less stress on your recovering back. Always use proper lifting techniques, including:  Bending your knees.  Keeping the load close to your body.  Avoiding twisting.  Find a comfortable position to sleep. Use a firm mattress and lie on your side with your knees slightly bent. If you lie on your back, put a pillow under your knees.  Avoid feeling anxious or stressed.Stress increases muscle tension and can worsen back pain.It is important to recognize when you are anxious or stressed and learn ways to manage it, such as with exercise.  Take medicines only as directed by your health care provider. Over-the-counter medicines to reduce pain and inflammation are often the most helpful.Your health care provider may prescribe muscle relaxant drugs.These medicines help dull your pain so you can more quickly return to your normal activities and healthy exercise.  Apply ice to the injured area:  Put ice in a plastic bag.  Place a  towel between your skin and the bag.  Leave the ice on for 20 minutes, 2-3 times a day for the first 2-3 days. After that, ice and heat may be alternated to reduce pain and spasms.  Maintain a healthy weight. Excess weight puts extra stress on your back and makes it difficult to maintain good posture. SEEK MEDICAL CARE IF:  You have pain that is not relieved with rest or medicine.  You have increasing pain going down into the legs or buttocks.  You have pain that does not improve in one week.  You have night pain.  You lose weight.  You have a fever or chills. SEEK IMMEDIATE MEDICAL CARE IF:   You develop new bowel or bladder control problems.  You have unusual weakness or numbness in your arms or legs.  You develop nausea or vomiting.  You develop abdominal pain.  You feel faint.   This information is not intended to replace advice given to you by your health care provider. Make sure you discuss any questions you have with your health care provider.   Document Released: 04/08/2005 Document Revised: 04/29/2014 Document Reviewed: 08/10/2013 Elsevier Interactive Patient Education [employer].

## 2015-07-18 LAB — URINE CULTURE

## 2015-07-26 ENCOUNTER — Encounter: Payer: Medicare Other | Attending: Emergency Medicine | Admitting: *Deleted

## 2015-07-26 DIAGNOSIS — Z713 Dietary counseling and surveillance: Secondary | ICD-10-CM | POA: Insufficient documentation

## 2015-07-26 DIAGNOSIS — E119 Type 2 diabetes mellitus without complications: Secondary | ICD-10-CM | POA: Diagnosis not present

## 2015-07-26 DIAGNOSIS — F509 Eating disorder, unspecified: Secondary | ICD-10-CM | POA: Insufficient documentation

## 2015-07-26 DIAGNOSIS — N183 Chronic kidney disease, stage 3 unspecified: Secondary | ICD-10-CM

## 2015-07-26 NOTE — Patient Instructions (Signed)
Treat yourself with kindness by drinking more water Laxatives don't actually work and too many will destroy your intestines.  Then you'll need a colostomy bag You're waiting too long to eat and then eating more so you feel stuffed; instead eat more often and will be able to eat smaller portions Goal is 3 meals and a snack that are balanced: protein, starch, fruit or vegetable most time.  You have already done this!!! You can do it again :-) Each time you fix a "Real meal" it gets easier

## 2015-07-26 NOTE — Progress Notes (Signed)
Appointment start time: 0800  Appointment end time: 0900  Patient was seen on 4/5//17 for nutrition counseling pertaining to disordered eating  Assessment: Cartina states she was out of commission last week due to sever back pain.  She has been taking medication for this.   States she has been restricting due to depressed appetite from [employer] Food log reveals 2 eating occasions/day.  Hunger cues are 1/2 and fullness is 8/9.   Low water.  2 laxatives daily   Estimated energy needs: 1600-2000 kcal 250 g carbohydrate 100 g protein 67 g fat   Nutrition Diagnosis: NB-1.5 Disordered eating pattern As related to eating disorder. As evidenced by dietary recall.  Intervention: Nutrition counseling provided. Challenged ED thoughts and reiterated positive nutrition messages.  Reminded 3 meals and 1 snack to prevent over-eating.  Reminded balanced meals. Reminded adequate water and ineffectiveness/dangerousness of laxative abuse.   Monitoring: patient will follow up in 1 weeks.

## 2015-08-02 ENCOUNTER — Ambulatory Visit: Payer: Self-pay | Admitting: *Deleted

## 2015-08-09 ENCOUNTER — Encounter: Payer: Medicare Other | Admitting: *Deleted

## 2015-08-09 DIAGNOSIS — R32 Unspecified urinary incontinence: Secondary | ICD-10-CM | POA: Insufficient documentation

## 2015-08-09 DIAGNOSIS — F509 Eating disorder, unspecified: Secondary | ICD-10-CM

## 2015-08-09 DIAGNOSIS — N183 Chronic kidney disease, stage 3 (moderate): Secondary | ICD-10-CM | POA: Diagnosis not present

## 2015-08-09 NOTE — Patient Instructions (Signed)
Review food logs to see trends or patterns Use MyPlate for meal planning 3 times a day;  Decrease proteins and starches to increase fruits, veggies, and dairy.  Add veggies to meals at home, choose fruit or veggie when you eat out as your side dish Drink more water Practice mindfulness while eating and stop eating at level 7/8.  No need to eat to level 9/10.  That hurts!!  Don't do it  It's ok to have snacks in between (yogurt, fruit, Kind bars, cheese and crackers, peanut butter and crackers) Try more whole grains  See what other foods are in you home and add to list Add the needed food to you house

## 2015-08-09 NOTE — Progress Notes (Signed)
Appointment start time: 0800  Appointment end time: 0900  Patient was seen on 08/09/15 for nutrition counseling pertaining to disordered eating  Assessment: Starting PT tomorrow for back pain.   States she's restricting again.  Food log reveals eating 2-3 times/day energy dense foods like cereal, hot wings, and gelato daily.  Inadequate fluids   Estimated energy needs: 1600-2000 kcal 250 g carbohydrate 100 g protein 67 g fat   Nutrition Diagnosis: NB-1.5 Disordered eating pattern As related to eating disorder. As evidenced by dietary recall.  Intervention: Nutrition counseling provided.  Review food logs to see trends or patterns Use MyPlate for meal planning 3 times a day;  Decrease proteins and starches to increase fruits, veggies, and dairy.  Add veggies to meals at home, choose fruit or veggie when you eat out as your side dish Drink more water Practice mindfulness while eating and stop eating at level 7/8.  No need to eat to level 9/10.  That hurts!!  Don't do it  It's ok to have snacks in between (yogurt, fruit, Kind bars, cheese and crackers, peanut butter and crackers) Try more whole grains  See what other foods are in you home and add to list Add the needed food to you house  Monitoring: patient will follow up in 1 weeks.

## 2015-08-16 ENCOUNTER — Encounter: Payer: Medicare Other | Admitting: *Deleted

## 2015-08-16 DIAGNOSIS — F509 Eating disorder, unspecified: Secondary | ICD-10-CM

## 2015-08-16 DIAGNOSIS — E1129 Type 2 diabetes mellitus with other diabetic kidney complication: Secondary | ICD-10-CM

## 2015-08-16 DIAGNOSIS — N183 Chronic kidney disease, stage 3 (moderate): Secondary | ICD-10-CM | POA: Diagnosis not present

## 2015-08-16 NOTE — Patient Instructions (Signed)
Think about eating the amount of food your body needs, not a specific number or amount.  It's ok to be different amounts of fuel in your car, it's also ok to put different amounts of food in your body. Think about why you're eating that specific amount? How is that helpful?  What would it be like to eat the amount your body needs instead?  Consider decreasing your laxative use.  Look at that [facility] chart.  Increase water and fiber

## 2015-08-16 NOTE — Progress Notes (Signed)
Appointment start time: 0800  Appointment end time: 0900  Patient was seen on 08/16/15 for nutrition counseling pertaining to disordered eating  Assessment: Per consultation with therapist, this provider learned that Brandi Love has mostly untreated OCD that affects her eating.  She eats a certain number of slices, pieces, handfuls of food because those numbers feels safe.  She also eats the same amount of Chipotle or [employer] because that is what she feels comfortable with or in control of.  Eating a varying amount of food is scary.  This is why she routinely overeats; because she's eating the safe portion, not the amount her body needs She's abusing laxatives more  Estimated energy needs: 1600-2000 kcal 250 g carbohydrate 100 g protein 67 g fat   Nutrition Diagnosis: NB-1.5 Disordered eating pattern As related to eating disorder. As evidenced by dietary recall.  Intervention: Nutrition counseling provided. Discussed intuitive eating vs eating by prescribed amount or number.  This is her OCD which her therapist will address, but discussed how intuitive eating could benefit her.  Discussed normal stool habits and discouraged laxative abuse  Goals: Think about eating the amount of food your body needs, not a specific number or amount.  It's ok to be different amounts of fuel in your car, it's also ok to put different amounts of food in your body. Think about why you're eating that specific amount? How is that helpful?  What would it be like to eat the amount your body needs instead?  Consider decreasing your laxative use.  Look at that [place] chart.  Increase water and fiber  Monitoring: patient will follow up in 2 weeks.

## 2015-08-30 ENCOUNTER — Ambulatory Visit: Payer: Self-pay | Admitting: *Deleted

## 2015-09-06 ENCOUNTER — Encounter: Payer: Self-pay | Admitting: *Deleted

## 2015-09-06 ENCOUNTER — Encounter: Payer: Medicare Other | Attending: Emergency Medicine | Admitting: *Deleted

## 2015-09-06 DIAGNOSIS — N183 Chronic kidney disease, stage 3 (moderate): Secondary | ICD-10-CM | POA: Diagnosis not present

## 2015-09-06 DIAGNOSIS — Z713 Dietary counseling and surveillance: Secondary | ICD-10-CM | POA: Diagnosis not present

## 2015-09-06 DIAGNOSIS — E119 Type 2 diabetes mellitus without complications: Secondary | ICD-10-CM | POA: Diagnosis not present

## 2015-09-06 DIAGNOSIS — F509 Eating disorder, unspecified: Secondary | ICD-10-CM | POA: Diagnosis not present

## 2015-09-06 NOTE — Progress Notes (Signed)
Appointment start time: 0800  Appointment end time: 0900  Patient was seen on 09/06/15 for nutrition counseling pertaining to disordered eating  Assessment: Dad was diagnosed with aggressive prostate cancer.  States she's not dealing well with the news and says she's been restricting as a result.  She is anxious about her upcoming weekend with her family for her dad's birthday party  Dietary recall: 2 english muffins with peanut butter 1/4 chicken, yogurt, handful chips Kind bar  Estimated energy needs: 1600-2000 kcal 250 g carbohydrate 100 g protein 67 g fat   Nutrition Diagnosis: NB-1.5 Disordered eating pattern As related to eating disorder. As evidenced by dietary recall.  Intervention: Nutrition counseling provided. Created [place] [employer] from her eating disorder.  Discussed challenging her eating disorder and taking control back of her life. Goal is to eat 3 times each day  Monitoring: patient will follow up in 1 weeks.

## 2015-09-13 ENCOUNTER — Encounter: Payer: Self-pay | Admitting: *Deleted

## 2015-09-13 ENCOUNTER — Encounter: Payer: Medicare Other | Admitting: *Deleted

## 2015-09-13 DIAGNOSIS — F509 Eating disorder, unspecified: Secondary | ICD-10-CM

## 2015-09-13 DIAGNOSIS — N183 Chronic kidney disease, stage 3 (moderate): Secondary | ICD-10-CM | POA: Diagnosis not present

## 2015-09-13 NOTE — Progress Notes (Signed)
Appointment start time: 0830  Appointment end time: 0915  Patient was seen on 09/13/15 for nutrition counseling pertaining to disordered eating  Assessment: This past weekend was her dad's bday party.  She ate 3 slices pizza, 3 handful cheetos, 1 cupcake, and some watermelon.  She didn't eat but twice that day.  She is eating twice most days lately.  She is restricting, but states she doesn't know why.  Sates she comes for nutrition counseling "because I have to.Marland KitchenMarland KitchenVenetia Love makes me." Brandi Love voices that she thinks this provider still has an eating disorder because  she doesn't think recovery is fully possible and because this provider is thin   Estimated energy needs: 1600-2000 kcal 250 g carbohydrate 100 g protein 67 g fat   Nutrition Diagnosis: NB-1.5 Disordered eating pattern As related to eating disorder. As evidenced by dietary recall.  Intervention: Nutrition counseling provided.  Challenged cognitive distortions.  Affirmed that recovery is possible. Advised she needs to eat at least 3 times/day, no matter what   Monitoring: patient will follow up in 1 weeks.

## 2015-09-20 ENCOUNTER — Encounter: Payer: Medicare Other | Admitting: *Deleted

## 2015-09-20 ENCOUNTER — Encounter: Payer: Self-pay | Admitting: *Deleted

## 2015-09-20 DIAGNOSIS — F509 Eating disorder, unspecified: Secondary | ICD-10-CM

## 2015-09-20 DIAGNOSIS — N183 Chronic kidney disease, stage 3 (moderate): Secondary | ICD-10-CM | POA: Diagnosis not present

## 2015-09-20 NOTE — Patient Instructions (Addendum)
1.  You are awesome!!! For having balanced meals more often 2.  Try to get back with water, either make the flavored bottles in your fridge with 1/2 packet or flavor an entire gallon jug of water and then pour cups, as needed 3.  Remember that eating only 2/day makes you hungrier and that eating 3 times keeps your hunger cues more normal 4.  Don't wash all your strawberries at once, just wash the ones you want to eat right then.  Maybe even buy smaller container 5.  Try to increase variety of fruits by trying melons this summer and peaches 6.  Try some cucumbers and celery and zucchini   Over medium high heat melt butter, add oil and saute garlic for 2 minutes until soft.  Add zucchini, carrots, broccoli and mushrooms.  Pour in soy sauce, teriyaki sauce and season with salt and pepper to taste.  Cook for 10 minutes until tender 7.  Try some eggs for more protein options 8.  Try not to go longer than 3-5 hours without eating 9.  Think about exercise.

## 2015-09-20 NOTE — Progress Notes (Signed)
Appointment start time: 0800  Appointment end time: 0900  Patient was seen on 09/20/15 for nutrition counseling pertaining to disordered eating  Assessment: Got lab results back and they are stable.   Thinks her eating is slowly getting better. She had cereal with a banana and a spoonful of PB for breakfast. Yesterday she had hot pockets, salad, and pineapple for dinner.  She has had balanced meals at least 10 days in a row!!  It hasn't been too hard.  It's a little hard to think of combinations of foods without copying the same thing over and over. Planning ahead is challenging.   She didn't stuff herself once and she ate 3 times 5 days out of 10! Knows she still isn't drinking enough water, a little less than usual.  It was helpful in the past to flavor her water and having them ready to go in the fridge   Estimated energy needs: 1600-2000 kcal 250 g carbohydrate 100 g protein 67 g fat   Nutrition Diagnosis: NB-1.5 Disordered eating pattern As related to eating disorder. As evidenced by dietary recall.  Intervention: Nutrition counseling provided.  Praised progress.  Discussed barriers and how to overcome.  Brainstormed options for increased variety.  Discussed water.  Mentioned briefly exercise as health benefit, not weight loss tool. 1.  You are awesome!!! For having balanced meals more often 2.  Try to get back with water, either make the flavored bottles in your fridge with 1/2 packet or flavor an entire gallon jug of water and then pour cups, as needed 3.  Remember that eating only 2/day makes you hungrier and that eating 3 times keeps your hunger cues more normal 4.  Don't wash all your strawberries at once, just wash the ones you want to eat right then.  Maybe even buy smaller container 5.  Try to increase variety of fruits by trying melons this summer and peaches 6.  Try some cucumbers and celery and zucchini   Over medium high heat melt butter, add oil and saute garlic for 2  minutes until soft.  Add zucchini, carrots, broccoli and mushrooms.  Pour in soy sauce, teriyaki sauce and season with salt and pepper to taste.  Cook for 10 minutes until tender 7.  Try some eggs for more protein options 8.  Try not to go longer than 3-5 hours without eating 9.  Think about exercise.     Monitoring: patient will follow up in 1 weeks.

## 2015-09-27 ENCOUNTER — Encounter: Payer: Medicare Other | Attending: Emergency Medicine | Admitting: *Deleted

## 2015-09-27 ENCOUNTER — Encounter: Payer: Self-pay | Admitting: *Deleted

## 2015-09-27 DIAGNOSIS — F509 Eating disorder, unspecified: Secondary | ICD-10-CM | POA: Diagnosis not present

## 2015-09-27 DIAGNOSIS — Z713 Dietary counseling and surveillance: Secondary | ICD-10-CM | POA: Insufficient documentation

## 2015-09-27 DIAGNOSIS — E119 Type 2 diabetes mellitus without complications: Secondary | ICD-10-CM

## 2015-09-27 DIAGNOSIS — N183 Chronic kidney disease, stage 3 (moderate): Secondary | ICD-10-CM | POA: Diagnosis present

## 2015-09-27 NOTE — Patient Instructions (Signed)
Great job so far!  1.  Try to drink whenever you eat 2.  Think about what it would be like to eat the amount you really want/need instead of a number 3.  Reflect on the progress you have made so far and how far you have come (as motivation for further ED recovery and OCD recovery)

## 2015-09-27 NOTE — Progress Notes (Signed)
Appointment start time: 0800  Appointment end time: 0900  Patient was seen on 09/27/15 for nutrition counseling pertaining to disordered eating  Assessment: Vergil initially states she "ate like a pig" but then was able to challenge that statement by assessing her internal hunger and fullness cues this past week.  She ate to comfortable fullness every day, ate balanced meals 2 times/day and ate 3 times every single day last week!  She isn't getting quite enough water, but otherwise is doing really well.  Monday she ate 5 times (1 time wasn't a full meal and the last time was because she forgot to eat protein with a meal so she ate it an hour later).  She is consistently trying to follow nutrition advice.  She is frustrated with her OCD symptoms of checking and eating specific numbers  Estimated energy needs: 1600-2000 kcal 250 g carbohydrate 100 g protein 67 g fat   Nutrition Diagnosis: NB-1.5 Disordered eating pattern As related to eating disorder. As evidenced by dietary recall.  Intervention: Nutrition counseling provided.  Praised progress.  Discussed barriers and how to overcome.   1.  Try to drink whenever you eat 2.  Think about what it would be like to eat the amount you really want/need instead of a number 3.  Reflect on the progress you have made so far and how far you have come (as motivation for further ED recovery and OCD recovery)  Monitoring: patient will follow up in 2 weeks.

## 2015-10-04 ENCOUNTER — Ambulatory Visit: Payer: Self-pay | Admitting: *Deleted

## 2015-10-11 ENCOUNTER — Encounter: Payer: Medicare Other | Admitting: *Deleted

## 2015-10-11 ENCOUNTER — Encounter: Payer: Self-pay | Admitting: *Deleted

## 2015-10-11 DIAGNOSIS — F509 Eating disorder, unspecified: Secondary | ICD-10-CM

## 2015-10-11 DIAGNOSIS — N183 Chronic kidney disease, stage 3 (moderate): Secondary | ICD-10-CM | POA: Diagnosis not present

## 2015-10-11 DIAGNOSIS — E119 Type 2 diabetes mellitus without complications: Secondary | ICD-10-CM

## 2015-10-11 NOTE — Patient Instructions (Addendum)
Make a sign to remind you to drink while you eat and put it in the living room Keep water everywhere!!! Drink it!!!! You're waiting too long to eat (too hungry) and eating until you're stuffed Spread out that food throughout the day. Eat every 3-5 hours instead and eat until you're at a 6/7, not 8/9 Remember, you eat balanced with your parents, you can at home too.   You don't have to cook like your mom, just do what you can (sandwiches, salads, soup, eggs, chicken, veggies, etc) Think about what exercise you enjoy

## 2015-10-11 NOTE — Progress Notes (Signed)
Appointment start time: 0800  Appointment end time: 0900  Patient was seen on 10/11/15 for nutrition counseling pertaining to disordered eating  Assessment: Most recent A1c 7.3% Dietary recall reveals going long periods without eating, until too much, and eating energy-dense snack foods.  Poor water consumption   Estimated energy needs: 1600-2000 kcal 250 g carbohydrate 100 g protein 67 g fat   Nutrition Diagnosis: NB-1.5 Disordered eating pattern As related to eating disorder. As evidenced by dietary recall.  Intervention: Nutrition counseling provided.  Praised progress.  Discussed barriers and how to overcome. Following meal plan, drinking fluids, and exercising enjoyably will help manage diabetes  Make a sign to remind you to drink while you eat and put it in the living room Keep water everywhere!!! Drink it!!!! You're waiting too long to eat (too hungry) and eating until you're stuffed Spread out that food throughout the day. Eat every 3-5 hours instead and eat until you're at a 6/7, not 8/9 Remember, you eat balanced with your parents, you can at home too.   You don't have to cook like your mom, just do what you can (sandwiches, salads, soup, eggs, chicken, veggies, etc) Think about what exercise you enjoy  Monitoring: patient will follow up in 1 weeks.

## 2015-10-12 ENCOUNTER — Encounter: Payer: Self-pay | Admitting: Psychiatry

## 2015-10-18 ENCOUNTER — Encounter: Payer: Self-pay | Admitting: *Deleted

## 2015-10-18 ENCOUNTER — Encounter: Payer: Medicare Other | Admitting: *Deleted

## 2015-10-18 DIAGNOSIS — F509 Eating disorder, unspecified: Secondary | ICD-10-CM

## 2015-10-18 DIAGNOSIS — E119 Type 2 diabetes mellitus without complications: Secondary | ICD-10-CM

## 2015-10-18 DIAGNOSIS — N183 Chronic kidney disease, stage 3 (moderate): Secondary | ICD-10-CM

## 2015-10-18 NOTE — Patient Instructions (Addendum)
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 Keep up the water Need to increase vegetables (try [employer] with cucumbers, carrots, salad) also green beans and corn.  Try to have a veggie every day Think about what exercises you like

## 2015-10-18 NOTE — Progress Notes (Signed)
Appointment start time: 0800  Appointment end time: 0900  Patient was seen on 10/18/15 for nutrition counseling pertaining to disordered eating  Assessment: Brandi Love reports increased depressive symptoms and she has been sleeping more as a coping strategy.  She is not sure why she is more depressed.  Her eating disorder voice is very loud.   Dietary recall is high in energy-dense prepackaged foods, improved water consumption.  Is eating 2-4 times/day.  She wants to know how to modify her eating for her increased sleep.   Estimated energy needs: 1600-2000 kcal 250 g carbohydrate 100 g protein 67 g fat   Nutrition Diagnosis: NB-1.5 Disordered eating pattern As related to eating disorder. As evidenced by dietary recall.  Intervention: Nutrition counseling provided.  Praised progress.  Discussed barriers and how to overcome. 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 Keep up the water Need to increase vegetables (try [employer] with cucumbers, carrots, salad) also green beans and corn.  Try to have a veggie every day Think about what exercises you like  Monitoring: patient will follow up in 1 weeks.

## 2015-10-25 ENCOUNTER — Encounter: Payer: Self-pay | Admitting: *Deleted

## 2015-10-25 ENCOUNTER — Encounter: Payer: Medicare Other | Attending: Emergency Medicine | Admitting: *Deleted

## 2015-10-25 DIAGNOSIS — N183 Chronic kidney disease, stage 3 (moderate): Secondary | ICD-10-CM | POA: Diagnosis not present

## 2015-10-25 DIAGNOSIS — E119 Type 2 diabetes mellitus without complications: Secondary | ICD-10-CM | POA: Diagnosis not present

## 2015-10-25 DIAGNOSIS — F509 Eating disorder, unspecified: Secondary | ICD-10-CM | POA: Diagnosis not present

## 2015-10-25 DIAGNOSIS — Z713 Dietary counseling and surveillance: Secondary | ICD-10-CM | POA: Diagnosis not present

## 2015-10-25 NOTE — Progress Notes (Signed)
Appointment start time: 0800  Appointment end time: 0900  Patient was seen on 10/25/15 for nutrition counseling pertaining to disordered eating  Assessment: Brandi Love reports increased depressive symptoms and she has been sleeping more as a coping strategy.  She is not sure why she is more depressed.  Her eating disorder voice is very loud.   She did purge on Monday for the first time in 2 years.  She felt guilty about the food she ate, but was able to recognize she didn't eat excessive food.  Insight is currently poor.  She voices concern that she may be "backsliding."  Dietary recall reveals extreme hunger and long periods between eating.  She is increasing her water and eats a vegetable (albeit cucumber or salad) most days.  Also eats many energy-dense prepackaged snacks.  Diet void of fruits, and low on HBV protein.     Estimated energy needs: 1600-2000 kcal 250 g carbohydrate 100 g protein 67 g fat   Nutrition Diagnosis: NB-1.5 Disordered eating pattern As related to eating disorder. As evidenced by dietary recall.  Intervention: Nutrition counseling provided. Advised of urgent need for new referral or care will be interrupted. 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 Keep up the water Need to increase vegetables (try [employer] with cucumbers, carrots, salad) also green beans and corn.  Try to have a veggie every day and fruit 3 times/week Think about what exercises you like  Monitoring: patient will follow up upon receipt of a referral

## 2015-11-01 ENCOUNTER — Ambulatory Visit: Payer: Self-pay | Admitting: *Deleted

## 2015-11-08 ENCOUNTER — Encounter: Payer: Self-pay | Admitting: *Deleted

## 2015-11-08 ENCOUNTER — Encounter: Payer: Medicare Other | Admitting: *Deleted

## 2015-11-08 DIAGNOSIS — E119 Type 2 diabetes mellitus without complications: Secondary | ICD-10-CM

## 2015-11-08 DIAGNOSIS — F509 Eating disorder, unspecified: Secondary | ICD-10-CM

## 2015-11-08 DIAGNOSIS — N183 Chronic kidney disease, stage 3 (moderate): Secondary | ICD-10-CM | POA: Diagnosis not present

## 2015-11-08 NOTE — Progress Notes (Signed)
Appointment start time: 0800  Appointment end time: 0900  Patient was seen on 11/08/15 for nutrition counseling pertaining to disordered eating  Assessment: A1C is 6.8%, down . Almost purged opn Sunday, but called therapist.  She ate a large bag of salad and felt very full Is struggling with DE thoughts     Estimated energy needs: 1600-2000 kcal 250 g carbohydrate 100 g protein 67 g fat   Nutrition Diagnosis: NB-1.5 Disordered eating pattern As related to eating disorder. As evidenced by dietary recall.  Intervention: Nutrition counseling provided. . Reviewed motivation to recover, path of recovery, etc  Challenged cognitive distortions.  emphasized importance of positive messaging.  Discussed fullness from fiber, not calories.  Advised honoring internal cues.      Monitoring: patient will follow up in 1 week

## 2015-11-15 ENCOUNTER — Encounter: Payer: Medicare Other | Admitting: *Deleted

## 2015-11-15 DIAGNOSIS — E119 Type 2 diabetes mellitus without complications: Secondary | ICD-10-CM

## 2015-11-15 DIAGNOSIS — N183 Chronic kidney disease, stage 3 unspecified: Secondary | ICD-10-CM

## 2015-11-15 DIAGNOSIS — F509 Eating disorder, unspecified: Secondary | ICD-10-CM

## 2015-11-15 NOTE — Patient Instructions (Signed)
Ed is in control right now.  Lilija deserves to be in control Take back control by reading Declaration, Life without Ed, Body Bloved on Instagram Aim to eat 3 times a day and try to eat 2 foods each time.  This will help decrease the need to eat more at night, which will decrease mental and physical discomfort and hopefully decrease laxative abuse  PS laxative abuse is purging.   Drink more water to help with poo

## 2015-11-15 NOTE — Progress Notes (Signed)
Appointment start time: 0800  Appointment end time: 0900  Patient was seen on 11/15/15 for nutrition counseling pertaining to disordered eating  Assessment: Will be titrating Seroquel off and starting Latuda.   She is restricting and binging at night (but does not recognize the binge) then abusing laxatives.  Thinks this is in response to her father's illness (who has a good prognosis) and she states that DE behaviors "give me control over my life." Did not read DOI or Life without Ed.  Did not look at [employer].  Did not put positive messaging in.  States she doesn't have time because she is sleeping more.  Weight appears to be increasing    24 hour recall 7 buffalo wings 1 cucumber dipped in ginger dressing, 5 handfuls chips, 1 kind bar 1 yogurt, dipping sticks  Estimated energy needs: 1600-2000 kcal 250 g carbohydrate 100 g protein 67 g fat   Nutrition Diagnosis: NB-1.5 Disordered eating pattern As related to eating disorder. As evidenced by dietary recall.  Intervention: Nutrition counseling provided.  Spent most of the visit discussing cognitive distortion over "control".  She does not have control over her eating or her bowels or her body.  Her eating disorder controls her eating.  Discussed what normal eating looks like and how that differs from her eating.  Discussed dangers of restricting and laxative abuse.  Discussed cognitive distortion of DE behaviors "giving control" and how it actually takes away control.  Reminded the importance of positive influences (books, DOI, etc).  Discussed cycle of restrict/binge/purge and how to stop it.  Agreed to try 2 food groups 3 times/day and increase water    Monitoring: patient will follow up in 1 week

## 2015-11-22 ENCOUNTER — Encounter: Payer: Medicare Other | Attending: Emergency Medicine | Admitting: *Deleted

## 2015-11-22 DIAGNOSIS — Z713 Dietary counseling and surveillance: Secondary | ICD-10-CM | POA: Insufficient documentation

## 2015-11-22 DIAGNOSIS — N183 Chronic kidney disease, stage 3 unspecified: Secondary | ICD-10-CM

## 2015-11-22 DIAGNOSIS — E119 Type 2 diabetes mellitus without complications: Secondary | ICD-10-CM | POA: Diagnosis not present

## 2015-11-22 DIAGNOSIS — F509 Eating disorder, unspecified: Secondary | ICD-10-CM

## 2015-11-22 NOTE — Progress Notes (Signed)
Appointment start time: 0800  Appointment end time: 0900  Patient was seen on 11/22/15 for nutrition counseling pertaining to disordered eating  Assessment: Started today titrating Seroquel off and starting Latuda.  Is sleeping 12 hours/day when she can States she spends her awake time "restricting".  Has a friend who works at [employer] who knows sign language.  .  She sees her a couple time time a week States she reads her DOI and her goals at least every other day or more.  She checks out Body Bloved when she is on her phone.  States she "can't recover because      24 hour recall 7 buffalo wings 1 yogurt, dipping sticks  Estimated energy needs: 1600-2000 kcal 250 g carbohydrate 100 g protein 67 g fat   Nutrition Diagnosis: NB-1.5 Disordered eating pattern As related to eating disorder. As evidenced by dietary recall.  Intervention: Nutrition counseling provided.  Provided MI for reasons for recovery.  She states she is 10:10 ready to recover, but her actions are 7:10.  Discussed level 8:10 steps (eating 3 times/day and reading more)  Monitoring: patient will follow up in 1 week

## 2015-11-29 ENCOUNTER — Encounter: Payer: Medicare Other | Admitting: *Deleted

## 2015-11-29 DIAGNOSIS — E119 Type 2 diabetes mellitus without complications: Secondary | ICD-10-CM

## 2015-11-29 DIAGNOSIS — F509 Eating disorder, unspecified: Secondary | ICD-10-CM

## 2015-11-29 DIAGNOSIS — N183 Chronic kidney disease, stage 3 unspecified: Secondary | ICD-10-CM

## 2015-11-29 DIAGNOSIS — E785 Hyperlipidemia, unspecified: Secondary | ICD-10-CM

## 2015-11-29 NOTE — Progress Notes (Signed)
Appointment start time: 0800  Appointment end time: 0900  Patient was seen on 11/29/15 for nutrition counseling pertaining to disordered eating  Assessment: When asked about her week, she states all she did was sleep.  She reports sleeping 18 hours/day.  She read some. ACT team hasn't been bringing her Lyrica  She had been administering her own medication most of her life until her last ACT team.  The new ACT team also doesn't allow her to administer her own medication.  She did overdose on ASA in 2008, but has not since.  The teams have trouble with her medication and she feels they get frustrated with her calling to remind them to get her meds right.  They haven't started her Latuda yet or titrated down Seroquel  Brandi Love doesn't know about all this [phone] ACT team number  States she was more depressed over the weekend.  Dad is doing well and Brandi Love isn't sure why she was more depressed, but she is doing a little better now.  However, she did sleep most of the day yesterday Can not use Tanita due to cochlear implant  Therapist recommended Ensure * therapist told her that this provider doesn't believe her fullness cues    Estimated energy needs: 1600 kcal 250 g carbohydrate 100 g protein 67 g fat   Nutrition Diagnosis: NB-1.5 Disordered eating pattern As related to eating disorder. As evidenced by dietary recall.  Intervention: Nutrition counseling provided.  Spent entire visit discussing hunger/fullness scale and how for her to rate her internal cues when recording her food intake   Monitoring: patient will follow up in 1 week

## 2015-12-06 ENCOUNTER — Encounter: Payer: Medicare Other | Admitting: *Deleted

## 2015-12-06 DIAGNOSIS — E119 Type 2 diabetes mellitus without complications: Secondary | ICD-10-CM

## 2015-12-06 DIAGNOSIS — N183 Chronic kidney disease, stage 3 unspecified: Secondary | ICD-10-CM

## 2015-12-06 DIAGNOSIS — F509 Eating disorder, unspecified: Secondary | ICD-10-CM

## 2015-12-06 NOTE — Patient Instructions (Signed)
You were sad so you distracted yourself by self-harm, which caused a temporary relief from the sadness, but then reflecting back on that it made you sad again so that activity did not actually help with the sadness   Instead of self-harm Sleep YouTube videos on the link Self-soothing: lotion, cuddle something soft, cuddle actual animals at the shelter, bubble bath, read something fun (Chicken Soup), journal, devotion or Bible, watch sermons from your church or [employer] or Lorenda Peck or JD Greear LookLarge.fr   Food is fuel for your brain.  A starved brain doesn't work and makes your depression, anxiety, OCD worse.  Also protein is important for the neurotransmitters in your brain for the medications you're taking. Without protein, you don't make neurotransmitters and your medication doesn't work

## 2015-12-06 NOTE — Progress Notes (Signed)
Appointment start time: 0800  Appointment end time: 0900  Patient was seen on 12/06/15 for nutrition counseling pertaining to disordered eating  Assessment: Medication change has been initiated today. Engaged in self-harming behavior this past weekend for the first time in ~3 years.  Talked with her therapist, but can't remember that converstation.  States that picking skin off her leg and creating a large wound is in her mind better than previous self-harm of cutting on her legs and arms   Is restricting food intake regularly.  Eats at level 1-2 hunger and stops at level 5-6. She is not getting enough to eat.  Depression is much worse.  Is not using her coping skills  Estimated energy needs: 1600 kcal 250 g carbohydrate 100 g protein 67 g fat   Nutrition Diagnosis: NB-1.5 Disordered eating pattern As related to eating disorder. As evidenced by dietary recall.  Intervention: Nutrition counseling provided.  Discussed coping skills for depression and how inadequate food intake exacerbates mental illness  Monitoring: patient will follow up in 1 week

## 2015-12-13 ENCOUNTER — Ambulatory Visit: Payer: Self-pay | Admitting: *Deleted

## 2015-12-20 ENCOUNTER — Ambulatory Visit: Payer: Self-pay | Admitting: *Deleted

## 2015-12-27 ENCOUNTER — Encounter: Payer: Medicare Other | Attending: Emergency Medicine | Admitting: *Deleted

## 2015-12-27 DIAGNOSIS — E785 Hyperlipidemia, unspecified: Secondary | ICD-10-CM

## 2015-12-27 DIAGNOSIS — E119 Type 2 diabetes mellitus without complications: Secondary | ICD-10-CM | POA: Diagnosis not present

## 2015-12-27 DIAGNOSIS — F509 Eating disorder, unspecified: Secondary | ICD-10-CM

## 2015-12-27 DIAGNOSIS — N183 Chronic kidney disease, stage 3 unspecified: Secondary | ICD-10-CM

## 2015-12-27 DIAGNOSIS — Z713 Dietary counseling and surveillance: Secondary | ICD-10-CM | POA: Insufficient documentation

## 2015-12-27 NOTE — Progress Notes (Signed)
Appointment start time: 0815  Appointment end time: 0900  Patient was seen on 12/27/15 for nutrition counseling pertaining to disordered eating  Assessment: Mom and endocrinologist told her to lose weight.  Penne states that made her mad.  She thinks that was not appropriate advice.  She did restrict Sunday, but ate later MD gave low glycemic index foods for her high triglycerides She will start working at [employer] this week     Estimated energy needs: 1600 kcal 250 g carbohydrate 100 g protein 67 g fat   Nutrition Diagnosis: NB-1.5 Disordered eating pattern As related to eating disorder. As evidenced by dietary recall.  Intervention: Nutrition counseling provided.   Challenged cognitive distortions.  Praised progress with water and trying to eat 3 times/day.  Discussed MNT for high triglycerides: exercise and fiber.  She agreed to walk 1-3 days/week for 20 minutes    Monitoring: patient will follow up in 1 week

## 2015-12-27 NOTE — Patient Instructions (Signed)
GREAT JOB with your hard work!!! Try 1 colace/day since you're drinking great water Try walking 1-3 days/week for 20 minutes at a time

## 2016-01-03 ENCOUNTER — Encounter: Payer: Medicare Other | Admitting: *Deleted

## 2016-01-03 DIAGNOSIS — N183 Chronic kidney disease, stage 3 unspecified: Secondary | ICD-10-CM

## 2016-01-03 DIAGNOSIS — F509 Eating disorder, unspecified: Secondary | ICD-10-CM

## 2016-01-03 DIAGNOSIS — E119 Type 2 diabetes mellitus without complications: Secondary | ICD-10-CM

## 2016-01-03 NOTE — Progress Notes (Signed)
Appointment start time: 0830  Appointment end time: 0900  Patient was seen on 01/03/16 for nutrition counseling pertaining to disordered eating  Assessment: Started working.  Sleep is "so-so" Thinks her food intake is "pretty good."  She is pleased.  States she restricted a few times this week, but overall her intake is improved.   Her variety has improved and she cut back on her Colace. She is experiencing diarrhea regularly as she still takes a laxative, Colace, and fiber pill daily.  She agrees to try to cut out Colace this week She eats salad most days  She forget to walk this week so scheduled a time on Friday in her phone to remind her to walk She has been checking her weight at home and that triggers restriction    Estimated energy needs: 1600 kcal 250 g carbohydrate 100 g protein 67 g fat   Nutrition Diagnosis: NB-1.5 Disordered eating pattern As related to eating disorder. As evidenced by dietary recall.  Intervention: Nutrition counseling provided.   Challenged cognitive distortions.  Praised progress with water and trying to eat 3 times/day.   Encouraged discontinuing Colace use entirely and to consider bringing scale to care team for safekeeping  Monitoring: patient will follow up in 1 week

## 2016-01-03 NOTE — Patient Instructions (Signed)
Do not take Colace this week.  Can still take fiber and laxative Walk on Friday Consider bringing scale to either [facility] [place] or [facility] for safekeeping. Keep up awesome awesome awesome work!!!!

## 2016-01-10 ENCOUNTER — Ambulatory Visit: Payer: Self-pay | Admitting: *Deleted

## 2016-01-17 ENCOUNTER — Encounter: Payer: Medicare Other | Admitting: *Deleted

## 2016-01-17 DIAGNOSIS — N183 Chronic kidney disease, stage 3 unspecified: Secondary | ICD-10-CM

## 2016-01-17 DIAGNOSIS — F509 Eating disorder, unspecified: Secondary | ICD-10-CM

## 2016-01-17 DIAGNOSIS — E119 Type 2 diabetes mellitus without complications: Secondary | ICD-10-CM

## 2016-01-17 NOTE — Patient Instructions (Signed)
Go for 20 minute walk on Thursday Think about turning scale into [place] or Mickel Baas Rate hunger using new sheet right before you eat.  Write it down.  Then eat.  Then immediately write down fullness using new sheet Keep up great work with water and not using Colace  PS you don't need laxative either :-)

## 2016-01-17 NOTE — Progress Notes (Signed)
Appointment start time: 0800  Appointment end time: 0900  Patient was seen on 01/17/16 for nutrition counseling pertaining to disordered eating  Assessment: Started working and is enjoying it.  She is not sleeping well as she works second shift Thinks her eating is going "pretty good"  She eats well on the days she works.  Is not eating as often on days she doesn't work..  Ed voice is louder on non-work days.   She continues to weigh herself daily at home.  This causes her to restrict as she thinks she weighs too much. She is scared to give up her scale She has stopped taking colace and is limited to 1 laxative and 1 fiber pill  continues to do really well with water.  Did not go for a walk as discussed. She continues to eat large portions.  She is out of touch with internal hunger and fullness cues   Estimated energy needs: 1600-1800 kcal 250 g carbohydrate 100 g protein 67 g fat   Nutrition Diagnosis: NB-1.5 Disordered eating pattern As related to eating disorder. As evidenced by dietary recall.  Intervention: Nutrition counseling provided.  Praised progress with water and no Colace use.  Suggested she also doesn't need the laxative.  Talked more about the scale and how daily weight checks fuels her eating disorder and causes restriction.  Discussed genetics and being fat and what that means.  Discussed how being thinner is possible through disordered behaviors only.  Discussed harmful effects of dieting and the thin ideal (similar to [place] food binding).  Discussed being physically fit in a fat body.  Discussed how being thinner did not make her happy as she attempted suicide when she was thin Challenged cognitive distortions.    Gave new hunger scale and requested more "real time" logging  Monitoring: patient will follow up in 1 week

## 2016-01-24 ENCOUNTER — Encounter: Payer: Medicare Other | Attending: Emergency Medicine | Admitting: *Deleted

## 2016-01-24 DIAGNOSIS — E119 Type 2 diabetes mellitus without complications: Secondary | ICD-10-CM | POA: Diagnosis not present

## 2016-01-24 DIAGNOSIS — F509 Eating disorder, unspecified: Secondary | ICD-10-CM

## 2016-01-24 DIAGNOSIS — Z713 Dietary counseling and surveillance: Secondary | ICD-10-CM | POA: Diagnosis not present

## 2016-01-24 DIAGNOSIS — N183 Chronic kidney disease, stage 3 unspecified: Secondary | ICD-10-CM

## 2016-01-24 NOTE — Progress Notes (Signed)
  Appointment start time: 0815  Appointment end time: 0900  Patient was seen on 01/24/16 for nutrition counseling pertaining to disordered eating  Assessment: Is late again.  States she falls asleep while getting ready in the morning.  She thinks it;s her seroquel which she has been on for years.  Her dose has not increased.  She state she is trying to get in contact with her ACT team to decrease her dose or discontinue that medication altogether She has not met with the psychiatrist in months and does not have an appointment set up.  She has been trying to get in contact withher ACT team and is not getting through States she restricted 3 days last week, but ate more appropriately 4 days.  She isn't sure why she restricted those days.   Forgot to use new hunger scale Did not go for a walk.  On the designated day she stayed in bed and cancelled her therapy appointment with her deaf therapist as she was upset with that provider for seemingly dismissing her BPD .    Estimated energy needs: 1600-1800 kcal 250 g carbohydrate 100 g protein 67 g fat   Nutrition Diagnosis: NB-1.5 Disordered eating pattern As related to eating disorder. As evidenced by dietary recall.  Intervention: challenged cognitive distortions.  discussed scale use, walking, and hunger cues.   Please get to bed earlier to help ensure promptness of appointments    Monitoring: patient will follow up in 1 week

## 2016-01-31 ENCOUNTER — Encounter: Payer: Medicare Other | Admitting: *Deleted

## 2016-01-31 DIAGNOSIS — E119 Type 2 diabetes mellitus without complications: Secondary | ICD-10-CM

## 2016-01-31 DIAGNOSIS — N183 Chronic kidney disease, stage 3 unspecified: Secondary | ICD-10-CM

## 2016-01-31 DIAGNOSIS — F509 Eating disorder, unspecified: Secondary | ICD-10-CM

## 2016-01-31 NOTE — Patient Instructions (Addendum)
Try to do the exercises from your physical therapist Continue to use the new hunger and fullness scale in the moment whenever possible Consider choosing more water over lemonade.  Lemon flavored water is fine.  Or sparkling water like [employer] Stop taking the laxative

## 2016-01-31 NOTE — Progress Notes (Signed)
  Appointment start time: 0800  Appointment end time: 0900  Patient was seen on 01/31/16 for nutrition counseling pertaining to disordered eating  Assessment: States she slept all day yesterday.  She has not been able to get in touch with her ACT team She used her new hunger sheet and it made her more aware of her stomach and how it was reacting to the food.  She was able to identify a different number of the scale.  That was interesting . She was not able to use it in the moment at work, but more often at home.  That was good different She ate about the same number of times.  Maybe a few more days she ate 3 times.  Sometimes she felt she ate too much She admits she eats all the food there as she grew up having to clean her plate.  She feels it's wasteful She has a note from her PCP to be able to drink while on the clock Her back hurts a lot from working     Estimated energy needs: 1600-1800 kcal 250 g carbohydrate 100 g protein 67 g fat   Nutrition Diagnosis: NB-1.5 Disordered eating pattern As related to eating disorder. As evidenced by dietary recall.  Intervention: challenged cognitive distortions.   Try to do the exercises from your physical therapist Continue to use the new hunger and fullness scale in the moment whenever possible Consider choosing more water over lemonade.  Lemon flavored water is fine.  Or sparkling water like [employer] Stop taking the laxative  Monitoring: patient will follow up in 1 week

## 2016-02-07 ENCOUNTER — Ambulatory Visit: Payer: Self-pay | Admitting: *Deleted

## 2016-02-14 ENCOUNTER — Encounter: Payer: Medicare Other | Admitting: *Deleted

## 2016-02-14 DIAGNOSIS — N183 Chronic kidney disease, stage 3 unspecified: Secondary | ICD-10-CM

## 2016-02-14 DIAGNOSIS — E119 Type 2 diabetes mellitus without complications: Secondary | ICD-10-CM

## 2016-02-14 DIAGNOSIS — E785 Hyperlipidemia, unspecified: Secondary | ICD-10-CM

## 2016-02-14 DIAGNOSIS — F509 Eating disorder, unspecified: Secondary | ICD-10-CM

## 2016-02-14 NOTE — Progress Notes (Signed)
  Appointment start time: 0800  Appointment end time: 0900  Patient was seen on 02/14/16 for nutrition counseling pertaining to disordered eating  Assessment: Work is going well, however, customers are mean and insulting.   She is not getting much sleep due to working late.  She wakes up multiple hours in advance of her appointments as it takes an exceedingly long time to get ready.  She sometimes dozes off while getting ready.  When she feels like she is running late, she gets anxious and starts to self-harm, which makes her more late.  When questioned about why it takes so long to get ready, she admits she washes her hands compulsively  Is using her hunger scale.  Realizes she is getting too hungry in between eating sessions   24 hour recall B: 4 croissant rolls L 4 croissant rolls CFA fry   Estimated energy needs: 1600-1800 kcal 250 g carbohydrate 100 g protein 67 g fat   Nutrition Diagnosis: NB-1.5 Disordered eating pattern As related to eating disorder. As evidenced by dietary recall.  Intervention: Continue to use the new hunger and fullness scale in the moment whenever possible Stop taking the laxative.  Consider washing hands 4 times instead of 5 in the morning  Monitoring: patient will follow up in 1 week

## 2016-02-21 ENCOUNTER — Ambulatory Visit: Payer: Self-pay | Admitting: *Deleted

## 2016-02-22 ENCOUNTER — Encounter: Payer: Medicare Other | Attending: Emergency Medicine | Admitting: *Deleted

## 2016-02-22 DIAGNOSIS — E785 Hyperlipidemia, unspecified: Secondary | ICD-10-CM

## 2016-02-22 DIAGNOSIS — N183 Chronic kidney disease, stage 3 unspecified: Secondary | ICD-10-CM

## 2016-02-22 DIAGNOSIS — E119 Type 2 diabetes mellitus without complications: Secondary | ICD-10-CM

## 2016-02-22 DIAGNOSIS — Z713 Dietary counseling and surveillance: Secondary | ICD-10-CM | POA: Diagnosis not present

## 2016-02-22 DIAGNOSIS — F509 Eating disorder, unspecified: Secondary | ICD-10-CM | POA: Diagnosis not present

## 2016-02-22 NOTE — Patient Instructions (Addendum)
Next appointment is 11/16 at 3 pm  Don't do things without a reason Eat some vegetables for your health: blood sugars, constipation Eat more of a balance, too much protein hurts your kidneys, too much starch makes your blood sugar go up   Breakfast: Cereal with fruit Whole wheat toast with peanut butter Granola and yogurt Oatmeal with nuts and fruit Toast with eggs  Lunch: [place] sandwich Hot wings with side salad Couple taquitoes with fruit  Dinner:  Rotisserie chicken, green beans, corn 1/2 chipotle with side salad CFA sandwich with salad or fruit  Please rate hunger and fullness cues in the moment, not after

## 2016-02-22 NOTE — Progress Notes (Signed)
Appointment start time:1400  Appointment end time: 1500  Patient was seen on 02/22/16 for nutrition counseling pertaining to disordered eating  Assessment: Has plantar fascitis and has been told to stay out of work for a week and rest with ice.   Thinks her eating is not going as well as she would like: Is eating 1 food at a time (not a balance) she is eating 2, maybe 3 times/day.  She is not sure why She is starting to think about restricting She has discontinued laxative use and is pooping normally.  She is a little nervous she will become constipated She is still struggling with falling asleep and can't get in contact with her ACT team. Dr. Tammi Klippel recommended cutting seroquel in half as that could be the culprit.    24 hour recall Carrots with ranch hto wings, mashed potatoes, gravy CFA with fries and milkshake      Estimated energy needs: 1600-1800 kcal 250 g carbohydrate 100 g protein 67 g fat   Nutrition Diagnosis: NB-1.5 Disordered eating pattern As related to eating disorder. As evidenced by dietary recall.  Intervention: Don't do things without a reason Eat some vegetables for your health: blood sugars, constipation Eat more of a balance, too much protein hurts your kidneys, too much starch makes your blood sugar go up   Breakfast: Cereal with fruit Whole wheat toast with peanut butter Granola and yogurt Oatmeal with nuts and fruit Toast with eggs  Lunch: [place] sandwich Hot wings with side salad Couple taquitoes with fruit  Dinner:  Rotisserie chicken, green beans, corn 1/2 chipotle with side salad CFA sandwich with salad or fruit  Please rate hunger and fullness cues in the moment, not after   Monitoring: patient will follow up in 2 week

## 2016-03-06 ENCOUNTER — Ambulatory Visit: Payer: Self-pay | Admitting: *Deleted

## 2016-03-07 ENCOUNTER — Encounter: Payer: Medicare Other | Admitting: *Deleted

## 2016-03-07 DIAGNOSIS — F509 Eating disorder, unspecified: Secondary | ICD-10-CM

## 2016-03-07 DIAGNOSIS — N183 Chronic kidney disease, stage 3 unspecified: Secondary | ICD-10-CM

## 2016-03-07 DIAGNOSIS — E119 Type 2 diabetes mellitus without complications: Secondary | ICD-10-CM

## 2016-03-07 DIAGNOSIS — E785 Hyperlipidemia, unspecified: Secondary | ICD-10-CM

## 2016-03-07 NOTE — Progress Notes (Signed)
Appointment start time:1500  Appointment end time: 1600  Patient was seen on 03/07/16 for nutrition counseling pertaining to disordered eating  Assessment: Has plantar fascitis and has been told to stay out of work for a week and rest with ice.   May go back today or tomorrow.  Is feeling ok.  She got new shoes and new orthodics.   Has to work Thanksgiving so family is getting together the day before She is having food anxiety about that  Had leftovers twice this week.  That feels different And ate more vegetables  Is still eating 2 times/day    Estimated energy needs: 1600-1800 kcal 250 g carbohydrate 100 g protein 67 g fat   Nutrition Diagnosis: NB-1.5 Disordered eating pattern As related to eating disorder. As evidenced by dietary recall.  Intervention:  Discussed taking ownership of her recovery.  Discussed not cleaning plate and eating until satisfied.  Discussed Thanksgiving    Breakfast: Cereal with fruit Whole wheat toast with peanut butter Granola and yogurt Oatmeal with nuts and fruit Toast with eggs  Lunch: [place] sandwich Hot wings with side salad Couple taquitoes with fruit  Dinner:  Rotisserie chicken, green beans, corn 1/2 chipotle with side salad CFA sandwich with salad or fruit  Please rate hunger and fullness cues in the moment, not after   Monitoring: patient will follow up in 1 week

## 2016-03-07 NOTE — Patient Instructions (Signed)
1.  Take this out of your bookbag 2.  Read it 3.  Read it again and again and again.  You HAVE to drown out Ed's voice with more positive messages.  It's hard, yes.  But it's worth it.    Thanksgiving: start with small portions of the things you want.  Eat what will satisfy you.  Eat more, if you want but do not feel like you have to clean your plate.  EVER!  That doesn't help  Don't do things without a reason Eat some vegetables for your health: blood sugars, constipation Eat more of a balance, too much protein hurts your kidneys, too much starch makes your blood sugar go up   Breakfast: Cereal with fruit Whole wheat toast with peanut butter Granola and yogurt Oatmeal with nuts and fruit Toast with eggs  Lunch: [place] sandwich Hot wings with side salad Couple taquitoes with fruit  Dinner:  Rotisserie chicken, green beans, corn 1/2 chipotle with side salad CFA sandwich with salad or fruit  Please rate hunger and fullness cues in the moment, not after

## 2016-03-11 ENCOUNTER — Encounter: Payer: Medicare Other | Admitting: *Deleted

## 2016-03-11 DIAGNOSIS — N183 Chronic kidney disease, stage 3 unspecified: Secondary | ICD-10-CM

## 2016-03-11 DIAGNOSIS — E785 Hyperlipidemia, unspecified: Secondary | ICD-10-CM

## 2016-03-11 DIAGNOSIS — F509 Eating disorder, unspecified: Secondary | ICD-10-CM

## 2016-03-11 DIAGNOSIS — E119 Type 2 diabetes mellitus without complications: Secondary | ICD-10-CM

## 2016-03-11 NOTE — Patient Instructions (Signed)
I am thankful for  My job  My family  My care team  My home  My car  My salvation  My friends at work

## 2016-03-11 NOTE — Progress Notes (Signed)
Appointment start time: 1145  Appointment end time: 1230  Patient was seen on 03/11/16 for nutrition counseling pertaining to disordered eating  Assessment:  Is late "because I overslept" because it takes a long time to do every day tasks.  Is concerned about Thanksgiving this week.  Did not re-read instructions as requested.  Consistently does not remember instructions.  Has trouble challenging disordered thoughts.  Does not appear to be taking ownership of her recovery.  Slow comprehension serves as a challenge in session  Estimated energy needs: 1600-1800 kcal 250 g carbohydrate 100 g protein 67 g fat   Nutrition Diagnosis: NB-1.5 Disordered eating pattern As related to eating disorder. As evidenced by dietary recall.  Intervention:  Discussed taking ownership of her recovery.  Discussed not cleaning plate and eating until satisfied.  Discussed Thanksgiving.  Gave resource on ED recovery through the holidays.  Reminded her about last year and how she was successful.     Breakfast: Cereal with fruit Whole wheat toast with peanut butter Granola and yogurt Oatmeal with nuts and fruit Toast with eggs  Lunch: [place] sandwich Hot wings with side salad Couple taquitoes with fruit  Dinner:  Rotisserie chicken, green beans, corn 1/2 chipotle with side salad CFA sandwich with salad or fruit  Please rate hunger and fullness cues in the moment, not after   Monitoring: patient will follow up in 1 week

## 2016-03-18 ENCOUNTER — Encounter: Payer: Medicare Other | Admitting: *Deleted

## 2016-03-18 ENCOUNTER — Ambulatory Visit: Payer: Self-pay | Admitting: *Deleted

## 2016-03-18 DIAGNOSIS — F509 Eating disorder, unspecified: Secondary | ICD-10-CM

## 2016-03-18 DIAGNOSIS — N183 Chronic kidney disease, stage 3 unspecified: Secondary | ICD-10-CM

## 2016-03-18 DIAGNOSIS — E119 Type 2 diabetes mellitus without complications: Secondary | ICD-10-CM

## 2016-03-18 NOTE — Progress Notes (Signed)
Appointment start time: 1130  Appointment end time: 1230  Patient was seen on 03/18/16 for nutrition counseling pertaining to disordered eating  Assessment:  Stopped taking Seroquel a few days ago.  Plans are to increase Latuda.   Thanksgiving was good.  She had fun with her family.  States she had small portions and only ate 1 plate.  That was a challenge.  It was hard to eat the food.  She liked it, but it was difficult to eat what appeared to be a larger amount of food.  It was not a larger amount of food, but it was more of a variety.  Her eating disorder voice told her than more variety means more volume.  She ate to level 8.     Estimated energy needs: 1600-1800 kcal 250 g carbohydrate 100 g protein 67 g fat   Nutrition Diagnosis: NB-1.5 Disordered eating pattern As related to eating disorder. As evidenced by dietary recall.  Intervention:  Being fully recovered means changing thoughts.  Changed thoughts leads to changed behaviors You have started changing behaviors, but changing thoughts will lead to full recovery and totally changed behaviors  Ed thoughts cause some behaviors, changing those thoughts to true thoughts will lead to different (healthy) behaviors  Because this takes time, you will need to work on those thoughts regularly.  This is why I ask you to reread our notes  It took 4 tries to realize that smaller pieces of paper were not more food (paper), but just different looking food (paper).  We changed that thought, but it took 4 tries.  This means we need to keep working on our thoughts.      Breakfast: Cereal with fruit Whole wheat toast with peanut butter Granola and yogurt Oatmeal with nuts and fruit Toast with eggs  Lunch: [place] sandwich Hot wings with side salad Couple taquitoes with fruit  Dinner:  Rotisserie chicken, green beans, corn 1/2 chipotle with side salad CFA sandwich with salad or fruit  Please rate hunger and fullness cues in  the moment, not after   Monitoring: patient will follow up in 1 week

## 2016-03-18 NOTE — Patient Instructions (Signed)
Being fully recovered means changing thoughts.  Changed thoughts leads to changed behaviors You have started changing behaviors, but changing thoughts will lead to full recovery and totally changed behaviors  Ed thoughts cause some behaviors, changing those thoughts to true thoughts will lead to different (healthy) behaviors  Because this takes time, you will need to work on those thoughts regularly.  This is why I ask you to reread our notes  It took 4 tries to realize that smaller pieces of paper were not more food (paper), but just different looking food (paper).  We changed that thought, but it took 4 tries.  This means we need to keep working on our thoughts.

## 2016-03-27 ENCOUNTER — Ambulatory Visit: Payer: Self-pay | Admitting: *Deleted

## 2016-04-03 ENCOUNTER — Ambulatory Visit: Payer: Self-pay | Admitting: *Deleted

## 2016-04-10 ENCOUNTER — Ambulatory Visit: Payer: Self-pay | Admitting: *Deleted

## 2016-04-18 ENCOUNTER — Ambulatory Visit: Payer: Self-pay | Admitting: *Deleted

## 2016-04-28 ENCOUNTER — Emergency Department (HOSPITAL_COMMUNITY)
Admission: EM | Admit: 2016-04-28 | Discharge: 2016-04-29 | Disposition: A | Discharge status: Home / Self Care | Payer: Medicare Other | Attending: Emergency Medicine | Admitting: Emergency Medicine

## 2016-04-28 ENCOUNTER — Encounter (HOSPITAL_COMMUNITY): Payer: Self-pay

## 2016-04-28 DIAGNOSIS — R51 Headache: Secondary | ICD-10-CM | POA: Diagnosis present

## 2016-04-28 DIAGNOSIS — N183 Chronic kidney disease, stage 3 unspecified: Secondary | ICD-10-CM

## 2016-04-28 DIAGNOSIS — E039 Hypothyroidism, unspecified: Secondary | ICD-10-CM | POA: Insufficient documentation

## 2016-04-28 DIAGNOSIS — I129 Hypertensive chronic kidney disease with stage 1 through stage 4 chronic kidney disease, or unspecified chronic kidney disease: Secondary | ICD-10-CM | POA: Diagnosis not present

## 2016-04-28 DIAGNOSIS — R519 Headache, unspecified: Secondary | ICD-10-CM

## 2016-04-28 DIAGNOSIS — Z79899 Other long term (current) drug therapy: Secondary | ICD-10-CM | POA: Diagnosis not present

## 2016-04-28 DIAGNOSIS — Z7982 Long term (current) use of aspirin: Secondary | ICD-10-CM | POA: Diagnosis not present

## 2016-04-28 DIAGNOSIS — E1122 Type 2 diabetes mellitus with diabetic chronic kidney disease: Secondary | ICD-10-CM | POA: Insufficient documentation

## 2016-04-28 LAB — BASIC METABOLIC PANEL
ANION GAP: 9 (ref 5–15)
BUN: 29 mg/dL — ABNORMAL HIGH (ref 6–20)
CALCIUM: 10.2 mg/dL (ref 8.9–10.3)
CHLORIDE: 109 mmol/L (ref 101–111)
CO2: 20 mmol/L — AB (ref 22–32)
CREATININE: 2.18 mg/dL — AB (ref 0.44–1.00)
GFR calc non Af Amer: 27 mL/min — ABNORMAL LOW (ref >60)
GFR, EST AFRICAN AMERICAN: 32 mL/min — AB (ref >60)
Glucose, Bld: 149 mg/dL — ABNORMAL HIGH (ref 65–99)
Potassium: 4.4 mmol/L (ref 3.5–5.1)
SODIUM: 138 mmol/L (ref 135–145)

## 2016-04-28 MED ORDER — ACETAMINOPHEN 325 MG PO TABS
650.0000 mg | ORAL_TABLET | Freq: Once | ORAL | Status: AC
  Administered 2016-04-28: 650 mg via ORAL
  Filled 2016-04-28: qty 2

## 2016-04-28 MED ORDER — METOCLOPRAMIDE HCL 10 MG PO TABS
10.0000 mg | ORAL_TABLET | Freq: Once | ORAL | Status: AC
  Administered 2016-04-28: 10 mg via ORAL
  Filled 2016-04-28: qty 1

## 2016-04-28 MED ORDER — DIPHENHYDRAMINE HCL 25 MG PO CAPS
25.0000 mg | ORAL_CAPSULE | Freq: Once | ORAL | Status: AC
  Administered 2016-04-28: 25 mg via ORAL
  Filled 2016-04-28: qty 1

## 2016-04-28 NOTE — ED Provider Notes (Signed)
[facility] DEPT Provider Note   CSN: [id number] Arrival date & time: 04/28/16  1414     History   Chief Complaint Chief Complaint  Patient presents with  . headache/ elevated potassium    HPI Brandi Love is a 41 y.o. female.  Patient states was called by pcp office and told that her K+ level drawn 12/28 was high, 5.6 and to go to ER.  Other than than, patient notes gradual onset dull frontal headache in past day. Mild, gradual onset, c/w prior headaches. No sinus drainage or congestion. No head injury, trauma or fall. No neck pain or stiffness. No eye pain or change in vision. No vomiting. Denies cough or uri c/o. No fever or chills. No abd pain. No vomiting or diarrhea. No gu c/o, urinating normal amount.      The history is provided by the patient.    Past Medical History:  Diagnosis Date  . Anemia   . Anxiety   . Chronic renal insufficiency   . Deafness   . Depression   . Diabetes mellitus without complication ([place])   . Eating disorder   . Endometriosis   . GERD (gastroesophageal reflux disease)   . Hyperlipidemia   . Hypertension   . 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 Migraine   . OCD (obsessive compulsive disorder)   . Pulmonary embolism ([place])   . Seizures ([place])   . Tachycardia     Patient Active Problem List   Diagnosis Date Noted  . MDD (major depressive disorder), recurrent, severe, with psychosis ([place]) 12/11/2014  . Eating disorder, unspecified 11/23/2012  . Unspecified hypothyroidism 11/23/2012  . Dyslipidemia 11/23/2012  . Sinus tachycardia 11/23/2012  . CKD (chronic kidney disease) stage 3, GFR 30-59 ml/min 11/18/2012  . Protein-calorie malnutrition, severe ([place]) 11/13/2012  . Abdominal pain, epigastric 11/12/2012  . Dehydration 11/12/2012  . Acute on chronic renal failure ([place]) 11/12/2012  . Leukocytosis, unspecified 11/12/2012  . Hyponatremia 11/12/2012  . Borderline personality disorder 11/12/2012    Past Surgical  History:  Procedure Laterality Date  . ABDOMINAL HYSTERECTOMY     'except for right ovary'  . BREAST LUMPECTOMY Left 2000   x2  . BREAST LUMPECTOMY Right 2002   benign  . COCHLEAR IMPLANT    . EYE SURGERY Left 1980  . EYE SURGERY Right 1990  . LAPAROSCOPIC ABDOMINAL EXPLORATION  2001 and 2002   for endometriosis    OB History    No data available       Home Medications    Prior to Admission medications   Medication Sig Start Date End Date Taking? Authorizing Provider  acetaminophen (TYLENOL) 500 MG tablet Take 500 mg by mouth every 6 (six) hours as needed for mild pain.    Historical Provider, MD  aspirin EC 81 MG tablet Take 81 mg by mouth daily.    Historical Provider, MD  buPROPion (WELLBUTRIN XL) 300 MG 24 hr tablet Take 300 mg by mouth daily.    Historical Provider, MD  cetirizine (ZYRTEC) 10 MG tablet Take 10 mg by mouth daily.    Historical Provider, MD  Choline Fenofibrate 45 MG capsule Take 45 mg by mouth daily.    Historical Provider, MD  cyclobenzaprine (FLEXERIL) 10 MG tablet Take 1 tablet (10 mg total) by mouth 2 (two) times daily as needed for muscle spasms. Patient not taking: Reported on 07/26/2015 07/17/15   Courteney Lyn Mackuen, MD  docusate sodium (COLACE) 100 MG capsule  Take 100 mg by mouth 2 (two) times daily.    Historical Provider, MD  haloperidol (HALDOL) 5 MG tablet Take 5 mg by mouth at bedtime.     Historical Provider, MD  HYDROcodone-acetaminophen (NORCO/VICODIN) 5-325 MG tablet Take by mouth. 07/31/15 07/30/16  Historical Provider, MD  lamoTRIgine (LAMICTAL) 200 MG tablet Take 200 mg by mouth daily.    Historical Provider, MD  levothyroxine (SYNTHROID, LEVOTHROID) 150 MCG tablet Take 150 mcg by mouth daily.    Historical Provider, MD  lurasidone (LATUDA) 20 MG TABS tablet Take 60 mg by mouth.     Historical Provider, MD  magnesium oxide (MAG-OX) 400 MG tablet Take 400 mg by mouth 2 (two) times daily.     Historical Provider, MD  Melatonin 3 MG TABS Take  3 mg by mouth at bedtime.     Historical Provider, MD  metoprolol succinate (TOPROL-XL) 50 MG 24 hr tablet Take 50 mg by mouth daily.     Historical Provider, MD  Multiple Vitamin (MULTIVITAMIN WITH MINERALS) TABS Take 1 tablet by mouth daily.    Historical Provider, MD  omega-3 acid ethyl esters (LOVAZA) 1 G capsule Take 2 g by mouth 2 (two) times daily.    Historical Provider, MD  omeprazole (PRILOSEC) 20 MG capsule Take 20 mg by mouth daily.    Historical Provider, MD  ondansetron (ZOFRAN) 4 MG tablet Take 4 mg by mouth every 8 (eight) hours as needed for nausea.    Historical Provider, MD  oxyCODONE-acetaminophen (PERCOCET) 10-325 MG tablet Take by mouth. 07/20/15 07/19/16  Historical Provider, MD  oxyCODONE-acetaminophen (PERCOCET/ROXICET) 5-325 MG tablet Take 1 tablet by mouth every 6 (six) hours as needed for severe pain. Patient not taking: Reported on 07/26/2015 07/17/15   Courteney Lyn Mackuen, MD  pravastatin (PRAVACHOL) 20 MG tablet Take 20 mg by mouth every evening.    Historical Provider, MD  pregabalin (LYRICA) 50 MG capsule Take 75 mg by mouth every 8 (eight) hours.     Historical Provider, MD  promethazine (PHENERGAN) 25 MG tablet Take 1 tablet (25 mg total) by mouth every 6 (six) hours as needed for nausea or vomiting. Patient not taking: Reported on 02/08/2015 06/24/14   Alvina Chou, PA-C  QUEtiapine (SEROQUEL) 200 MG tablet Take 100 mg by mouth at bedtime.     Historical Provider, MD  SUMAtriptan (IMITREX) 100 MG tablet Take 100 mg by mouth every 2 (two) hours as needed for migraine.    Historical Provider, MD  topiramate (TOPAMAX) 50 MG tablet Take 50 mg by mouth 2 (two) times daily.     Historical Provider, MD  Vitamin D, Ergocalciferol, (DRISDOL) 50000 UNITS CAPS Take 50,000 Units by mouth every 7 (seven) days. On Sunday    Historical Provider, MD    Family History Family History  Problem Relation Age of Onset  . Cancer Father   . Hyperlipidemia Father   . Osteoarthritis  Mother   . Hyperlipidemia Mother     Social History Social History  Substance Use Topics  . Smoking status: Never Smoker  . Smokeless tobacco: Never Used  . Alcohol use No     Allergies   Iodinated diagnostic agents; Dhea [nutritional supplements]; Risperidone and related; Abilify [aripiprazole]; Dihydroergotamine; Doxycycline; Entex lq [phenylephrine-guaifenesin]; Erythromycin; Lactose intolerance (gi); Nsaids; and Tape   Review of Systems Review of Systems  Constitutional: Negative for fever.  HENT: Negative for sore throat.   Eyes: Negative for redness.  Respiratory: Negative for shortness of breath.  Cardiovascular: Negative for chest pain.  Gastrointestinal: Negative for abdominal pain.  Genitourinary: Negative for flank pain.  Musculoskeletal: Negative for neck pain and neck stiffness.  Skin: Negative for rash.  Neurological: Negative for headaches.  Hematological: Does not bruise/bleed easily.  Psychiatric/Behavioral: Negative for confusion.     Physical Exam Updated Vital Signs BP 125/97 (BP Location: Left Arm)   Pulse 87   Temp 98.4 F (36.9 C) (Oral)   Resp 18   Ht 5\' 8"  (1.727 m)   Wt 81.6 kg   SpO2 96%   BMI 27.37 kg/m   Physical Exam  Constitutional: She appears well-developed and well-nourished. No distress.  HENT:  Head: Atraumatic.  Nose: Nose normal.  Mouth/Throat: Oropharynx is clear and moist.  No sinus or temporal tenderness.  Eyes: Conjunctivae and EOM are normal. Pupils are equal, round, and reactive to light. No scleral icterus.  Neck: Neck supple. No tracheal deviation present. No thyromegaly present.  No stiffness or rigidity.   Cardiovascular: Normal rate, regular rhythm, normal heart sounds and intact distal pulses.  Exam reveals no gallop and no friction rub.   No murmur heard. Pulmonary/Chest: Effort normal and breath sounds normal. No respiratory distress.  Abdominal: Soft. Normal appearance and bowel sounds are normal. She  exhibits no distension. There is no tenderness.  Genitourinary:  Genitourinary Comments: No cva tenderness.  Musculoskeletal: Normal range of motion. She exhibits no edema or tenderness.  Neurological: She is alert. A cranial nerve deficit is present.  Motor intact bilaterally. Steady gait.   Skin: Skin is warm and dry. No rash noted. She is not diaphoretic.  Psychiatric: She has a normal mood and affect.  Nursing note and vitals reviewed.    ED Treatments / Results  Labs (all labs ordered are listed, but only abnormal results are displayed) Results for orders placed or performed during the hospital encounter of 23/76/28  Basic metabolic panel  Result Value Ref Range   Sodium 138 135 - 145 mmol/L   Potassium 4.4 3.5 - 5.1 mmol/L   Chloride 109 101 - 111 mmol/L   CO2 20 (L) 22 - 32 mmol/L   Glucose, Bld 149 (H) 65 - 99 mg/dL   BUN 29 (H) 6 - 20 mg/dL   Creatinine, Ser 2.18 (H) 0.44 - 1.00 mg/dL   Calcium 10.2 8.9 - 10.3 mg/dL   GFR calc non Af Amer 27 (L) >60 mL/min   GFR calc Af Amer 32 (L) >60 mL/min   Anion gap 9 5 - 15   No results found.  EKG  EKG Interpretation  Date/Time:  Sunday April 28 2016 15:12:14 EST Ventricular Rate:  85 PR Interval:    QRS Duration: 85 QT Interval:  364 QTC Calculation: 433 R Axis:   23 Text Interpretation:  Sinus rhythm Nonspecific T wave abnormality No significant change since last tracing Confirmed by Ashok Cordia  MD, Lennette Bihari ([id number]) on 04/28/2016 3:16:30 PM       Radiology No results found.  Procedures Procedures (including critical care time)  Medications Ordered in ED Medications  diphenhydrAMINE (BENADRYL) capsule 25 mg (not administered)  acetaminophen (TYLENOL) tablet 650 mg (not administered)  metoCLOPramide (REGLAN) tablet 10 mg (not administered)     Initial Impression / Assessment and Plan / ED Course  I have reviewed the triage vital signs and the nursing notes.  Pertinent labs & imaging results that were available  during my care of the patient were reviewed by me and considered in my medical decision making (  see chart for details).  Clinical Course     Po fluids. reglan po, tylenol po.   Reviewed nursing notes and prior charts for additional history.   Labs sent.  k normal.   Pt comfortable, no distress, and appears stable for d/c.     Final Clinical Impressions(s) / ED Diagnoses   Final diagnoses:  None    New Prescriptions New Prescriptions   No medications on file     Lajean Saver, MD 04/28/16 [phone]

## 2016-04-28 NOTE — ED Notes (Signed)
gingerale offered and consumed by pt

## 2016-04-28 NOTE — ED Triage Notes (Signed)
Patient complains of headache x 1 month with nausea. Was called by her MD and advised to come to ED due to elevated potassium of 5.6. Alert and oriented. Patient is deaf but reads lips. Alert and oriented, NAD

## 2016-04-28 NOTE — Discharge Instructions (Signed)
It was our pleasure to provide your ER care today - we hope that you feel better.  Your potassium level is good/normal (4.4).   Follow up with your doctor in the next 1-2 weeks.  Return to ER if worse, new symptoms, or other medical emergency.

## 2016-05-06 ENCOUNTER — Encounter (HOSPITAL_COMMUNITY): Payer: Self-pay

## 2016-05-06 ENCOUNTER — Emergency Department (HOSPITAL_COMMUNITY)
Admission: EM | Admit: 2016-05-06 | Discharge: 2016-05-10 | Disposition: A | Discharge status: Other Acute Inpatient Hospital | Payer: Medicare Other | Attending: Emergency Medicine | Admitting: Emergency Medicine

## 2016-05-06 DIAGNOSIS — E039 Hypothyroidism, unspecified: Secondary | ICD-10-CM | POA: Insufficient documentation

## 2016-05-06 DIAGNOSIS — R45851 Suicidal ideations: Secondary | ICD-10-CM | POA: Diagnosis not present

## 2016-05-06 DIAGNOSIS — I129 Hypertensive chronic kidney disease with stage 1 through stage 4 chronic kidney disease, or unspecified chronic kidney disease: Secondary | ICD-10-CM | POA: Insufficient documentation

## 2016-05-06 DIAGNOSIS — Z79899 Other long term (current) drug therapy: Secondary | ICD-10-CM | POA: Insufficient documentation

## 2016-05-06 DIAGNOSIS — E1122 Type 2 diabetes mellitus with diabetic chronic kidney disease: Secondary | ICD-10-CM | POA: Diagnosis not present

## 2016-05-06 DIAGNOSIS — N183 Chronic kidney disease, stage 3 (moderate): Secondary | ICD-10-CM | POA: Insufficient documentation

## 2016-05-06 DIAGNOSIS — Z7982 Long term (current) use of aspirin: Secondary | ICD-10-CM | POA: Diagnosis not present

## 2016-05-06 DIAGNOSIS — R519 Headache, unspecified: Secondary | ICD-10-CM

## 2016-05-06 DIAGNOSIS — R51 Headache: Secondary | ICD-10-CM | POA: Insufficient documentation

## 2016-05-06 LAB — CBC
HEMATOCRIT: 43.7 % (ref 36.0–46.0)
HEMOGLOBIN: 15 g/dL (ref 12.0–15.0)
MCH: 30.8 pg (ref 26.0–34.0)
MCHC: 34.3 g/dL (ref 30.0–36.0)
MCV: 89.7 fL (ref 78.0–100.0)
Platelets: 336 10*3/uL (ref 150–400)
RBC: 4.87 MIL/uL (ref 3.87–5.11)
RDW: 13.1 % (ref 11.5–15.5)
WBC: 14.3 10*3/uL — ABNORMAL HIGH (ref 4.0–10.5)

## 2016-05-06 LAB — COMPREHENSIVE METABOLIC PANEL
ALK PHOS: 54 U/L (ref 38–126)
ALT: 20 U/L (ref 14–54)
AST: 15 U/L (ref 15–41)
Albumin: 3.7 g/dL (ref 3.5–5.0)
Anion gap: 11 (ref 5–15)
BILIRUBIN TOTAL: 0.4 mg/dL (ref 0.3–1.2)
BUN: 28 mg/dL — ABNORMAL HIGH (ref 6–20)
CALCIUM: 10 mg/dL (ref 8.9–10.3)
CO2: 21 mmol/L — AB (ref 22–32)
CREATININE: 2.11 mg/dL — AB (ref 0.44–1.00)
Chloride: 105 mmol/L (ref 101–111)
GFR calc non Af Amer: 29 mL/min — ABNORMAL LOW (ref >60)
GFR, EST AFRICAN AMERICAN: 33 mL/min — AB (ref >60)
GLUCOSE: 107 mg/dL — AB (ref 65–99)
Potassium: 4.3 mmol/L (ref 3.5–5.1)
Sodium: 137 mmol/L (ref 135–145)
Total Protein: 7.3 g/dL (ref 6.5–8.1)

## 2016-05-06 LAB — RAPID URINE DRUG SCREEN, HOSP PERFORMED
Amphetamines: NOT DETECTED
BARBITURATES: NOT DETECTED
Benzodiazepines: NOT DETECTED
Cocaine: NOT DETECTED
Opiates: NOT DETECTED
Tetrahydrocannabinol: NOT DETECTED

## 2016-05-06 LAB — ETHANOL: Alcohol, Ethyl (B): <5 mg/dL (ref <5)

## 2016-05-06 LAB — SALICYLATE LEVEL

## 2016-05-06 LAB — CBG MONITORING, ED: Glucose-Capillary: 98 mg/dL (ref 65–99)

## 2016-05-06 LAB — ACETAMINOPHEN LEVEL: Acetaminophen (Tylenol), Serum: <10 ug/mL — ABNORMAL LOW (ref 10–30)

## 2016-05-06 MED ORDER — DIPHENHYDRAMINE HCL 50 MG/ML IJ SOLN
12.5000 mg | Freq: Once | INTRAMUSCULAR | Status: AC
Start: 2016-05-06 — End: 2016-05-06
  Administered 2016-05-06: 12.5 mg via INTRAVENOUS
  Filled 2016-05-06: qty 1

## 2016-05-06 MED ORDER — SODIUM CHLORIDE 0.9 % IV BOLUS (SEPSIS)
500.0000 mL | Freq: Once | INTRAVENOUS | Status: AC
  Administered 2016-05-06: 500 mL via INTRAVENOUS

## 2016-05-06 MED ORDER — DEXAMETHASONE SODIUM PHOSPHATE 10 MG/ML IJ SOLN
10.0000 mg | Freq: Once | INTRAMUSCULAR | Status: AC
  Administered 2016-05-06: 10 mg via INTRAVENOUS
  Filled 2016-05-06: qty 1

## 2016-05-06 MED ORDER — METOCLOPRAMIDE HCL 5 MG/ML IJ SOLN
10.0000 mg | Freq: Once | INTRAMUSCULAR | Status: AC
  Administered 2016-05-06: 10 mg via INTRAVENOUS
  Filled 2016-05-06: qty 2

## 2016-05-06 NOTE — ED Triage Notes (Signed)
Pt from home with complaint of chronic migraines and pt states "Because I"ve been having migraines for so long I'm having thoughts of hurting myself" Pt states that she does have a plan but refused to tell this RN what plan was. Pt ambulatory.

## 2016-05-06 NOTE — ED Provider Notes (Signed)
[facility] DEPT Provider Note   CSN: [id number] Arrival date & time: 05/06/16  1959   By signing my name below, I, Soijett Blue, attest that this documentation has been prepared under the direction and in the presence of Charlann Lange, PA-C Electronically Signed: Soijett Blue, ED Scribe. 05/06/16. 10:14 PM.  History   Chief Complaint Chief Complaint  Patient presents with  . Migraine  . Suicidal    HPI Brandi Love is a 41 y.o. female with a PMHx of deafness, migraine, DM, HTN, who presents to the Emergency Department complaining of throbbing, constant, migraine onset 2 months ago. Pt notes that her migraines began when she got her hearing implants. Pt states that she called her PCP prior to arrival and was informed that she should come into the ED for further evaluation. Pt is having associated symptoms of nausea, blurred vision, and neck pain. She notes that she has tried Imitrex with no relief of her symptoms. She denies fever, vomiting, cough, nasal congestion, CP, abdominal pain, and any other symptoms. Pt denies modifying factors for her migraine.   Pt secondarily complains of feeling suicidal. Pt states " I feel hopeless, because the migraines are so bad and I just cant tolerate it anymore." Pt notes that she doesn't have a plan at this time, but notes that she attempted to harm herself in the past. Pt is having associated symptoms of auditory hallucinations. She reports "I always hear voices that tell me to hurt myself".  Pt hasn't tried any medications for her symptoms. Pt denies visual hallucinations and any other symptoms.    The history is provided by the patient. No language interpreter was used.    Past Medical History:  Diagnosis Date  . Anemia   . Anxiety   . Chronic renal insufficiency   . Deafness   . Depression   . Diabetes mellitus without complication ([place])   . Eating disorder   . Endometriosis   . GERD (gastroesophageal reflux disease)   .  Hyperlipidemia   . Hypertension   . 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 Migraine   . OCD (obsessive compulsive disorder)   . Pulmonary embolism ([place])   . Seizures ([place])   . Tachycardia     Patient Active Problem List   Diagnosis Date Noted  . MDD (major depressive disorder), recurrent, severe, with psychosis ([place]) 12/11/2014  . Eating disorder, unspecified 11/23/2012  . Unspecified hypothyroidism 11/23/2012  . Dyslipidemia 11/23/2012  . Sinus tachycardia 11/23/2012  . CKD (chronic kidney disease) stage 3, GFR 30-59 ml/min 11/18/2012  . Protein-calorie malnutrition, severe ([place]) 11/13/2012  . Abdominal pain, epigastric 11/12/2012  . Dehydration 11/12/2012  . Acute on chronic renal failure ([place]) 11/12/2012  . Leukocytosis, unspecified 11/12/2012  . Hyponatremia 11/12/2012  . Borderline personality disorder 11/12/2012    Past Surgical History:  Procedure Laterality Date  . ABDOMINAL HYSTERECTOMY     'except for right ovary'  . BREAST LUMPECTOMY Left 2000   x2  . BREAST LUMPECTOMY Right 2002   benign  . COCHLEAR IMPLANT    . EYE SURGERY Left 1980  . EYE SURGERY Right 1990  . LAPAROSCOPIC ABDOMINAL EXPLORATION  2001 and 2002   for endometriosis    OB History    No data available       Home Medications    Prior to Admission medications   Medication Sig Start Date End Date Taking? Authorizing Provider  aspirin EC 81 MG tablet Take  81 mg by mouth at bedtime.    Yes Historical Provider, MD  Multiple Vitamin (MULTIVITAMIN WITH MINERALS) TABS Take 1 tablet by mouth daily.   Yes Historical Provider, MD  ondansetron (ZOFRAN) 4 MG tablet Take 4 mg by mouth every 8 (eight) hours as needed for nausea.   Yes Historical Provider, MD  SUMAtriptan (IMITREX) 100 MG tablet Take 100 mg by mouth every 2 (two) hours as needed for migraine.   Yes Historical Provider, MD  acetaminophen (TYLENOL) 500 MG tablet Take 500 mg by mouth every 6 (six) hours as needed for mild  pain.    Historical Provider, MD  buPROPion (WELLBUTRIN XL) 300 MG 24 hr tablet Take 300 mg by mouth daily.    Historical Provider, MD  cetirizine (ZYRTEC) 10 MG tablet Take 10 mg by mouth daily.    Historical Provider, MD  Choline Fenofibrate 45 MG capsule Take 45 mg by mouth daily.    Historical Provider, MD  cyclobenzaprine (FLEXERIL) 10 MG tablet Take 1 tablet (10 mg total) by mouth 2 (two) times daily as needed for muscle spasms. Patient not taking: Reported on 07/26/2015 07/17/15   Courteney Lyn Mackuen, MD  docusate sodium (COLACE) 100 MG capsule Take 100 mg by mouth 2 (two) times daily.    Historical Provider, MD  haloperidol (HALDOL) 5 MG tablet Take 5 mg by mouth at bedtime.     Historical Provider, MD  HYDROcodone-acetaminophen (NORCO/VICODIN) 5-325 MG tablet Take by mouth. 07/31/15 07/30/16  Historical Provider, MD  lamoTRIgine (LAMICTAL) 200 MG tablet Take 200 mg by mouth daily.    Historical Provider, MD  levothyroxine (SYNTHROID, LEVOTHROID) 150 MCG tablet Take 150 mcg by mouth daily.    Historical Provider, MD  lurasidone (LATUDA) 20 MG TABS tablet Take 60 mg by mouth.     Historical Provider, MD  magnesium oxide (MAG-OX) 400 MG tablet Take 400 mg by mouth 2 (two) times daily.     Historical Provider, MD  Melatonin 3 MG TABS Take 3 mg by mouth at bedtime.     Historical Provider, MD  metoprolol succinate (TOPROL-XL) 50 MG 24 hr tablet Take 50 mg by mouth daily.     Historical Provider, MD  omega-3 acid ethyl esters (LOVAZA) 1 G capsule Take 2 g by mouth 2 (two) times daily.    Historical Provider, MD  omeprazole (PRILOSEC) 20 MG capsule Take 20 mg by mouth daily.    Historical Provider, MD  oxyCODONE-acetaminophen (PERCOCET) 10-325 MG tablet Take by mouth. 07/20/15 07/19/16  Historical Provider, MD  oxyCODONE-acetaminophen (PERCOCET/ROXICET) 5-325 MG tablet Take 1 tablet by mouth every 6 (six) hours as needed for severe pain. Patient not taking: Reported on 07/26/2015 07/17/15   Courteney  Lyn Mackuen, MD  pravastatin (PRAVACHOL) 20 MG tablet Take 20 mg by mouth every evening.    Historical Provider, MD  pregabalin (LYRICA) 50 MG capsule Take 75 mg by mouth every 8 (eight) hours.     Historical Provider, MD  promethazine (PHENERGAN) 25 MG tablet Take 1 tablet (25 mg total) by mouth every 6 (six) hours as needed for nausea or vomiting. Patient not taking: Reported on 02/08/2015 06/24/14   Alvina Chou, PA-C  QUEtiapine (SEROQUEL) 200 MG tablet Take 100 mg by mouth at bedtime.     Historical Provider, MD  topiramate (TOPAMAX) 50 MG tablet Take 50 mg by mouth 2 (two) times daily.     Historical Provider, MD  Vitamin D, Ergocalciferol, (DRISDOL) 50000 UNITS CAPS Take 50,000 Units  by mouth every 7 (seven) days. On Sunday    Historical Provider, MD    Family History Family History  Problem Relation Age of Onset  . Cancer Father   . Hyperlipidemia Father   . Osteoarthritis Mother   . Hyperlipidemia Mother     Social History Social History  Substance Use Topics  . Smoking status: Never Smoker  . Smokeless tobacco: Never Used  . Alcohol use No     Allergies   Entex lq [phenylephrine-guaifenesin]; Iodinated diagnostic agents; Propoxyphene; Depakote [divalproex sodium]; Dhea [nutritional supplements]; Indomethacin; Risperidone and related; Abilify [aripiprazole]; Dihydroergotamine; Doxycycline; Erythromycin; Lactose intolerance (gi); Nsaids; and Tape   Review of Systems Review of Systems  HENT: Negative for congestion.   Eyes: Positive for visual disturbance.  Cardiovascular: Negative for chest pain.  Gastrointestinal: Positive for nausea. Negative for abdominal pain and vomiting.  Musculoskeletal: Positive for neck pain.  Neurological: Positive for headaches.  Psychiatric/Behavioral: Positive for hallucinations (auditory). Negative for suicidal ideas.     Physical Exam Updated Vital Signs BP 124/93 (BP Location: Left Arm)   Pulse 93   Temp 98.1 F (36.7 C)  (Oral)   Resp 16   Ht 5\' 8"  (1.727 m)   Wt 184 lb (83.5 kg)   SpO2 97%   BMI 27.98 kg/m   Physical Exam  Constitutional: She is oriented to person, place, and time. She appears well-developed and well-nourished. No distress.  HENT:  Head: Normocephalic and atraumatic.  Eyes: EOM are normal.  Chronic disconjugate gaze.   Neck: Neck supple.  Cardiovascular: Normal rate, regular rhythm and normal heart sounds.  Exam reveals no gallop and no friction rub.   No murmur heard. Pulmonary/Chest: Effort normal and breath sounds normal. No respiratory distress. She has no wheezes. She has no rales.  Abdominal: Soft. She exhibits no distension. There is no tenderness.  Musculoskeletal: Normal range of motion.  Neurological: She is alert and oriented to person, place, and time. She has normal strength. No cranial nerve deficit or sensory deficit. Coordination normal.  Cranial nerves 3-12 intact. No facial asymmetry. Strength equal to BUE and BLE. Sensation intact and symmetric. Speech is clear and focused. No deficits of coordination.   Skin: Skin is warm and dry.  Psychiatric: She has a normal mood and affect. Her behavior is normal.  Nursing note and vitals reviewed.    ED Treatments / Results  DIAGNOSTIC STUDIES: Oxygen Saturation is 97% on RA, nl by my interpretation.    COORDINATION OF CARE: 10:26 PM Discussed treatment plan with pt at bedside which includes labs, UDS, consult to TTS, and pt agreed to plan.   Labs (all labs ordered are listed, but only abnormal results are displayed) Labs Reviewed  COMPREHENSIVE METABOLIC PANEL - Abnormal; Notable for the following:       Result Value   CO2 21 (*)    Glucose, Bld 107 (*)    BUN 28 (*)    Creatinine, Ser 2.11 (*)    GFR calc non Af Amer 29 (*)    GFR calc Af Amer 33 (*)    All other components within normal limits  ACETAMINOPHEN LEVEL - Abnormal; Notable for the following:    Acetaminophen (Tylenol), Serum <10 (*)    All  other components within normal limits  CBC - Abnormal; Notable for the following:    WBC 14.3 (*)    All other components within normal limits  ETHANOL  SALICYLATE LEVEL  RAPID URINE DRUG SCREEN, HOSP PERFORMED  CBG MONITORING, ED   Results for orders placed or performed during the hospital encounter of 05/06/16  Comprehensive metabolic panel  Result Value Ref Range   Sodium 137 135 - 145 mmol/L   Potassium 4.3 3.5 - 5.1 mmol/L   Chloride 105 101 - 111 mmol/L   CO2 21 (L) 22 - 32 mmol/L   Glucose, Bld 107 (H) 65 - 99 mg/dL   BUN 28 (H) 6 - 20 mg/dL   Creatinine, Ser 2.11 (H) 0.44 - 1.00 mg/dL   Calcium 10.0 8.9 - 10.3 mg/dL   Total Protein 7.3 6.5 - 8.1 g/dL   Albumin 3.7 3.5 - 5.0 g/dL   AST 15 15 - 41 U/L   ALT 20 14 - 54 U/L   Alkaline Phosphatase 54 38 - 126 U/L   Total Bilirubin 0.4 0.3 - 1.2 mg/dL   GFR calc non Af Amer 29 (L) >60 mL/min   GFR calc Af Amer 33 (L) >60 mL/min   Anion gap 11 5 - 15  Ethanol  Result Value Ref Range   Alcohol, Ethyl (B) <5 <5 mg/dL  Salicylate level  Result Value Ref Range   Salicylate Lvl <5.0 2.8 - 30.0 mg/dL  Acetaminophen level  Result Value Ref Range   Acetaminophen (Tylenol), Serum <10 (L) 10 - 30 ug/mL  cbc  Result Value Ref Range   WBC 14.3 (H) 4.0 - 10.5 K/uL   RBC 4.87 3.87 - 5.11 MIL/uL   Hemoglobin 15.0 12.0 - 15.0 g/dL   HCT 43.7 36.0 - 46.0 %   MCV 89.7 78.0 - 100.0 fL   MCH 30.8 26.0 - 34.0 pg   MCHC 34.3 30.0 - 36.0 g/dL   RDW 13.1 11.5 - 15.5 %   Platelets 336 150 - 400 K/uL  Rapid urine drug screen (hospital performed)  Result Value Ref Range   Opiates NONE DETECTED NONE DETECTED   Cocaine NONE DETECTED NONE DETECTED   Benzodiazepines NONE DETECTED NONE DETECTED   Amphetamines NONE DETECTED NONE DETECTED   Tetrahydrocannabinol NONE DETECTED NONE DETECTED   Barbiturates NONE DETECTED NONE DETECTED  CBG monitoring, ED  Result Value Ref Range   Glucose-Capillary 98 65 - 99 mg/dL    EKG  EKG  Interpretation None       Radiology No results found.  Procedures Procedures (including critical care time)  Medications Ordered in ED Medications - No data to display   Initial Impression / Assessment and Plan / ED Course  I have reviewed the triage vital signs and the nursing notes.  Pertinent labs & imaging results that were available during my care of the patient were reviewed by me and considered in my medical decision making (see chart for details).  Clinical Course     Patient presents with headache that has been daily for the past 2 months, located in bilateral frontal areas. She has been evaluated and treated by her primary care physician with Imitrex which is not provided resolution to the headache pain. Headache cocktail provided with no relief. Tylenol and Ativan added, also without change.   VSS. Labs are stable with mild leukocytosis. She remains awake alert, oriented. No neurologic deficits. Discussed that we may not be able to control her headache and she continues to endorse SI. Will have TTS consultation to determine safety for discharge.   TTS consultation finds the patient in need of inpatient care.   Final Clinical Impressions(s) / ED Diagnoses   Final diagnoses:  None   1.  Headache 2. SI  New Prescriptions New Prescriptions   No medications on file   I personally performed the services described in this documentation, which was scribed in my presence. The recorded information has been reviewed and is accurate.      Charlann Lange, PA-C 05/08/16 1886    Merrily Pew, MD 05/08/16 [phone]

## 2016-05-06 NOTE — ED Notes (Signed)
Pt states migraine has been going on since thanksgiving.

## 2016-05-07 ENCOUNTER — Encounter (HOSPITAL_COMMUNITY): Payer: Self-pay | Admitting: *Deleted

## 2016-05-07 DIAGNOSIS — R51 Headache: Secondary | ICD-10-CM | POA: Diagnosis not present

## 2016-05-07 MED ORDER — METOPROLOL SUCCINATE ER 25 MG PO TB24
50.0000 mg | ORAL_TABLET | Freq: Every day | ORAL | Status: DC
  Administered 2016-05-07 – 2016-05-10 (×4): 50 mg via ORAL
  Filled 2016-05-07 (×4): qty 2

## 2016-05-07 MED ORDER — ACETAMINOPHEN 500 MG PO TABS
1000.0000 mg | ORAL_TABLET | Freq: Once | ORAL | Status: AC
Start: 2016-05-07 — End: 2016-05-07
  Administered 2016-05-07: 1000 mg via ORAL
  Filled 2016-05-07: qty 2

## 2016-05-07 MED ORDER — BUPROPION HCL ER (XL) 150 MG PO TB24
300.0000 mg | ORAL_TABLET | Freq: Every day | ORAL | Status: DC
  Administered 2016-05-07 – 2016-05-10 (×4): 300 mg via ORAL
  Filled 2016-05-07 (×4): qty 2

## 2016-05-07 MED ORDER — LURASIDONE HCL 20 MG PO TABS
60.0000 mg | ORAL_TABLET | Freq: Every day | ORAL | Status: DC
  Administered 2016-05-07 – 2016-05-09 (×3): 60 mg via ORAL
  Filled 2016-05-07 (×4): qty 3

## 2016-05-07 MED ORDER — LORAZEPAM 2 MG/ML IJ SOLN
0.5000 mg | Freq: Once | INTRAMUSCULAR | Status: AC
Start: 2016-05-07 — End: 2016-05-07
  Administered 2016-05-07: 0.5 mg via INTRAVENOUS
  Filled 2016-05-07: qty 1

## 2016-05-07 MED ORDER — TOPIRAMATE 25 MG PO TABS
50.0000 mg | ORAL_TABLET | Freq: Two times a day (BID) | ORAL | Status: DC
  Administered 2016-05-07 – 2016-05-10 (×7): 50 mg via ORAL
  Filled 2016-05-07 (×7): qty 2

## 2016-05-07 MED ORDER — MAGNESIUM OXIDE 400 (241.3 MG) MG PO TABS
400.0000 mg | ORAL_TABLET | Freq: Two times a day (BID) | ORAL | Status: DC
  Administered 2016-05-07 – 2016-05-09 (×5): 400 mg via ORAL
  Filled 2016-05-07 (×9): qty 1

## 2016-05-07 MED ORDER — LORATADINE 10 MG PO TABS
10.0000 mg | ORAL_TABLET | Freq: Every day | ORAL | Status: DC
  Administered 2016-05-07 – 2016-05-10 (×4): 10 mg via ORAL
  Filled 2016-05-07 (×5): qty 1

## 2016-05-07 MED ORDER — LORAZEPAM 1 MG PO TABS
1.0000 mg | ORAL_TABLET | Freq: Three times a day (TID) | ORAL | Status: DC | PRN
Start: 2016-05-07 — End: 2016-05-10
  Administered 2016-05-07 – 2016-05-09 (×3): 1 mg via ORAL
  Filled 2016-05-07 (×3): qty 1

## 2016-05-07 MED ORDER — PRAVASTATIN SODIUM 40 MG PO TABS
20.0000 mg | ORAL_TABLET | Freq: Every evening | ORAL | Status: DC
  Administered 2016-05-07 – 2016-05-09 (×3): 20 mg via ORAL
  Filled 2016-05-07 (×4): qty 1

## 2016-05-07 MED ORDER — PREGABALIN 75 MG PO CAPS
75.0000 mg | ORAL_CAPSULE | Freq: Three times a day (TID) | ORAL | Status: DC
  Administered 2016-05-07 – 2016-05-10 (×9): 75 mg via ORAL
  Filled 2016-05-07 (×11): qty 1

## 2016-05-07 MED ORDER — LEVOTHYROXINE SODIUM 150 MCG PO TABS
150.0000 ug | ORAL_TABLET | Freq: Every day | ORAL | Status: DC
  Administered 2016-05-07 – 2016-05-09 (×3): 150 ug via ORAL
  Filled 2016-05-07 (×4): qty 1

## 2016-05-07 MED ORDER — QUETIAPINE FUMARATE 25 MG PO TABS
100.0000 mg | ORAL_TABLET | Freq: Every day | ORAL | Status: DC
  Administered 2016-05-07 – 2016-05-09 (×3): 100 mg via ORAL
  Filled 2016-05-07 (×3): qty 4

## 2016-05-07 MED ORDER — ACETAMINOPHEN 325 MG PO TABS
650.0000 mg | ORAL_TABLET | ORAL | Status: DC | PRN
  Administered 2016-05-07 – 2016-05-10 (×6): 650 mg via ORAL
  Filled 2016-05-07 (×7): qty 2

## 2016-05-07 MED ORDER — DOCUSATE SODIUM 100 MG PO CAPS
100.0000 mg | ORAL_CAPSULE | Freq: Two times a day (BID) | ORAL | Status: DC
  Administered 2016-05-07 – 2016-05-10 (×7): 100 mg via ORAL
  Filled 2016-05-07 (×7): qty 1

## 2016-05-07 MED ORDER — ONDANSETRON HCL 4 MG PO TABS
4.0000 mg | ORAL_TABLET | Freq: Three times a day (TID) | ORAL | Status: DC | PRN
  Administered 2016-05-07: 4 mg via ORAL
  Filled 2016-05-07: qty 1

## 2016-05-07 MED ORDER — LAMOTRIGINE 100 MG PO TABS
200.0000 mg | ORAL_TABLET | Freq: Every day | ORAL | Status: DC
  Administered 2016-05-07 – 2016-05-10 (×4): 200 mg via ORAL
  Filled 2016-05-07 (×4): qty 2

## 2016-05-07 MED ORDER — MAGNESIUM OXIDE 400 MG PO TABS: 400.0000 mg | ORAL_TABLET | Freq: Two times a day (BID) | ORAL | Status: DC

## 2016-05-07 MED ORDER — HALOPERIDOL 5 MG PO TABS
5.0000 mg | ORAL_TABLET | Freq: Every day | ORAL | Status: DC
  Administered 2016-05-07 – 2016-05-09 (×3): 5 mg via ORAL
  Filled 2016-05-07 (×3): qty 1

## 2016-05-07 MED ORDER — PANTOPRAZOLE SODIUM 40 MG PO TBEC
40.0000 mg | DELAYED_RELEASE_TABLET | Freq: Every day | ORAL | Status: DC
  Administered 2016-05-07 – 2016-05-10 (×4): 40 mg via ORAL
  Filled 2016-05-07 (×4): qty 1

## 2016-05-07 NOTE — ED Notes (Signed)
Regular diet was ordered for patient.

## 2016-05-07 NOTE — ED Notes (Signed)
Walked in pts room to give medications and pt was attempting to strangle herself with pants. Pt is able to talk and has pulse. She states "her pain is too much." Lights left on and sitter moved closer to bed. Pt requesting ativan

## 2016-05-07 NOTE — BH Assessment (Signed)
Clinical information faxed to [place], [place], [place], [place], [place], [place], [place], [place]

## 2016-05-07 NOTE — ED Notes (Signed)
Patient was given a snack and drink. 

## 2016-05-07 NOTE — ED Notes (Signed)
Pt moved to be to be visible by nurses station

## 2016-05-07 NOTE — ED Notes (Signed)
Pt states tylenol never touches the pain.

## 2016-05-07 NOTE — ED Notes (Signed)
Carb modified no sharps breakfast tray ordered

## 2016-05-07 NOTE — ED Notes (Signed)
Pt provided a snack.
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
## 2016-05-08 DIAGNOSIS — R51 Headache: Secondary | ICD-10-CM | POA: Diagnosis not present

## 2016-05-08 MED ORDER — DIPHENHYDRAMINE HCL 50 MG/ML IJ SOLN
50.0000 mg | Freq: Once | INTRAMUSCULAR | Status: AC
  Administered 2016-05-08: 50 mg via INTRAMUSCULAR
  Filled 2016-05-08: qty 1

## 2016-05-08 MED ORDER — PROCHLORPERAZINE EDISYLATE 5 MG/ML IJ SOLN
10.0000 mg | Freq: Once | INTRAMUSCULAR | Status: AC
  Administered 2016-05-08: 10 mg via INTRAMUSCULAR
  Filled 2016-05-08: qty 2

## 2016-05-08 MED ORDER — KETOROLAC TROMETHAMINE 60 MG/2ML IM SOLN
60.0000 mg | Freq: Once | INTRAMUSCULAR | Status: AC
  Administered 2016-05-08: 60 mg via INTRAMUSCULAR
  Filled 2016-05-08: qty 2

## 2016-05-08 MED ORDER — PREGABALIN 75 MG PO CAPS
75.0000 mg | ORAL_CAPSULE | Freq: Once | ORAL | Status: AC
  Administered 2016-05-08: 75 mg via ORAL

## 2016-05-08 NOTE — ED Notes (Signed)
Pt stated that she needed her cell phone to contact her social worker that was scheduled to check on her tomorrow morning. Offered desk phone, but pt states she is unable to use d/t hearing issues and requires cell phone for CSW's phone number and accessibility.   Phone retrieved from security safe, rest of valuables envelope left with security. Phone given to pt with instructions to make necessary phone call and then return device so that it can be placed back in security safe.

## 2016-05-08 NOTE — ED Notes (Signed)
Regular Diet has been ordered for Lunch. 

## 2016-05-08 NOTE — ED Notes (Signed)
Informed Security that pt's cell phone would remain at [place] station, rather than be placed back in security safe. Amended existing pt's valuables envelope to note removal of phone. Envelope placed in new security pouch and returned to safe. Placed pt label on back of phone and placed phone at nurse's station.

## 2016-05-08 NOTE — ED Notes (Signed)
Pt removed scrub top and tied around her neck. Removed top from around neck and placed outside room. Moved sitter from Room 8 to her room for closer monitoring.

## 2016-05-08 NOTE — ED Notes (Addendum)
Attempted to wake pt to take AM meds, pt refusing to take meds at this time. Pt opening eyes, even/unlabored respirations.

## 2016-05-08 NOTE — ED Notes (Addendum)
Contacted by pt's therapist Wilfred Lacy Cidra, [phone]) concerning her current status, and given verbal authorization to discuss PHI with her by the pt.   Therapist concerned at pt's "untreated" pain and placement in psych hold d/t SI. Commented that pt's PCP (Dr. Tammi Klippel, [phone] [place] [phone]) sent her to ED for relief of week-long migraine. PCP and therapist believed that pt's SI was d/t continued, unrelieved pain and that pt would no longer be suicide risk if given pain relief. Informed caller that pt had been given multiple pain relievers, including "migraine cocktail" upon initial presentation to ED, and that prior RN had reported pt calm and sleeping heavily for much of the day. Therapist states that pt has had quiet, calm affect normally, even over this past week while reporting the extensive pain. Also reports that pt is often sleepy and due for a sleep study. Told caller that I would discuss her concerns and insights with the EDP.  Discussed situation with Dr. Tyrone Nine.

## 2016-05-08 NOTE — ED Provider Notes (Signed)
Sleeping comfortably   Orlie Dakin, MD 05/08/16 [phone]

## 2016-05-08 NOTE — ED Notes (Signed)
Pt had pants tied loosely around her neck. Removed scrub pants from around neck and left just outside door if pt desires to ambulate from room.

## 2016-05-08 NOTE — ED Notes (Signed)
A drink was given to patient. And a Lunch order was taken.

## 2016-05-08 NOTE — BHH Counselor (Signed)
Pt reassessed at 1405.  Pt presented to hospital on 1/16 with complaint of suicidal ideation due to severe migraines.  Today, Pt reported continued suicidal ideation and also auditory hallucinations -- voices telling her to harm herself due to severity of migraines.    Per Starleen Arms NP, Pt continues to meet inpatient criteria.

## 2016-05-09 DIAGNOSIS — R51 Headache: Secondary | ICD-10-CM | POA: Diagnosis not present

## 2016-05-09 MED ORDER — PROCHLORPERAZINE EDISYLATE 5 MG/ML IJ SOLN
10.0000 mg | Freq: Once | INTRAMUSCULAR | Status: AC
Start: 2016-05-09 — End: 2016-05-09
  Administered 2016-05-09: 10 mg via INTRAMUSCULAR
  Filled 2016-05-09: qty 2

## 2016-05-09 NOTE — ED Provider Notes (Signed)
  Physical Exam  BP 98/66 (BP Location: Right Arm)   Pulse 88   Temp 98.2 F (36.8 C) (Axillary)   Resp 16   Ht 5\' 8"  (1.727 m)   Wt 184 lb (83.5 kg)   SpO2 95%   BMI 27.98 kg/m   Physical Exam  ED Course  Procedures  MDM Patient had continued headaches. Had been given Compazine. Patient later attempted to strangle herself with her pants. No apparent damage from that. At this point she is willing to stay but I was not let her leave if she attempts to.       Brandi Belling, MD 05/09/16 2012

## 2016-05-09 NOTE — ED Notes (Signed)
Attempting to give medications as scheduled but patient can only stay awake for a few seconds at a time.  Unable to sit up enough to safely take medication or drink fluid. Delaying medication administration until the patient is more coherent.

## 2016-05-09 NOTE — ED Notes (Signed)
Patient is agitated and being combative while staff attempt to assess her for injuries caused by wrapping pants around her throat. Spoke with EDP who instructed me to give her a dose of her PRN PO Ativan. EDP is coming to assess patient in person.

## 2016-05-09 NOTE — ED Notes (Signed)
pts sleeping

## 2016-05-09 NOTE — ED Notes (Signed)
This RN was notified by sitter Daisey Must) that pt attempted to strangle herself with her pants while relieving lunch sitter was in room. Sitter Daisey Must) removed pt pants and placed outside of room. This RN notified pt that she will be given her pants we needing to leave the room. Pt was notified that lights are to be left on for better visibility. This RN also removed call bell from room to prevent further injuries. Sitter will remain at bedside. Pt presents no acute distress. Breathing unlabored and even. Pt coloring appropriate for ethnicity. No red marks observed around the pt neck.

## 2016-05-09 NOTE — BHH Counselor (Signed)
Pt reports SI. Pt denies HI. Pt reports auditory hallucinations. Pt states she has an ACT team with [facility]. Pt reports multiple medications.  Lorenza Cambridge, [facility] Triage Specialist

## 2016-05-09 NOTE — ED Notes (Signed)
Patient was assessed by EDP. Patient put in cochlear implants to speak with EDP and indicated that her headache was the reason she tried to hurt herself. EDP assessment revealed no obvious signs of physical trauma.

## 2016-05-09 NOTE — ED Notes (Signed)
Pt refusing VS at this time.

## 2016-05-10 DIAGNOSIS — R51 Headache: Secondary | ICD-10-CM | POA: Diagnosis not present

## 2016-05-10 MED ORDER — HALOPERIDOL LACTATE 5 MG/ML IJ SOLN
5.0000 mg | Freq: Once | INTRAMUSCULAR | Status: AC
  Administered 2016-05-10: 5 mg via INTRAMUSCULAR
  Filled 2016-05-10: qty 1

## 2016-05-10 NOTE — Progress Notes (Signed)
Followed up on inpatient referrals.   Referred to: [facility]  Declined: [facility]- due to hx of seizures and tachycardia listed in medical history per Leim Fabry- due to eating d/o, seizures in medical hx per Regan Rakers- acuity of symptoms per Janett Billow  Also considered for admission to [facility] upon bed availability.  Sharren Bridge, MSW, LCSW Clinical Social Work, Disposition  05/10/2016 [phone]

## 2016-05-10 NOTE — ED Notes (Addendum)
Pt asking sitter for something for her migraine. Pt has been sleeping soundly since midnight. Pt claims nothing has helped her migraines, but seems unaware that she has been asleep for the past 6hrs. Pt presents as obtunded and has to take time to focus on written notes I have used to aid communication. Pt initially refusing vitals and available meds "unless I speak with the doctor".   Discussed pt and situation with Dr. Betsey Holiday. MD gave verbal order for 5mg  haldol IM, and re-affirmed that opiates were not an option.

## 2016-05-10 NOTE — ED Notes (Signed)
Pt sleeping soundly with eyes closed, Pt respirations even and unlabored. Pt arousable with sternal rub. Unable to arouse pt enough to take PO meds at this time.

## 2016-05-10 NOTE — ED Notes (Signed)
Notified by pt sitter that pt had taken shirt off and was attempting to wrap around self. Sitter intervened and removed shirt from room. Pt covered with blankets.

## 2016-05-10 NOTE — Progress Notes (Signed)
Pt accepted to [facility] by Dr. Bronson Ing. Per Oletta Darter, intake RN, pt's bed will be available 15:30 today and report can be called to [phone].   Notified [facility].  Sharren Bridge, MSW, LCSW Clinical Social Work, Disposition  05/10/2016 [phone]

## 2016-05-10 NOTE — ED Notes (Signed)
Carb modified diet tray ordered for breakfast.

## 2016-05-10 NOTE — ED Notes (Signed)
Per pt request, this RN attempted to contact pt's therapist, Esperanza Heir, to update her on the pt's situation and what had been done to this point. No answer at phone number given yesterday. Left voicemail with callback number to [facility] phone.

## 2016-05-10 NOTE — ED Notes (Signed)
Pt not waking with gentle stimuli.  Sternal rub given with pt opening her eyes.  Pt instructed to put on hearing aids, meds to be given, pt informed of acceptance to [facility], was told she could not be transported without clothing.  Scrubs given, pt was asked if she could be trusted not to remove clothing to harm herself. Pt states "I think so".

## 2016-05-10 NOTE — ED Notes (Signed)
Patient received lunch tray 

## 2016-05-10 NOTE — ED Notes (Signed)
Regular Diet has been Ordered.

## 2016-05-10 NOTE — ED Notes (Signed)
A snack and drink was placed on patient bedside table, and a Regular Diet was taken for lunch.

## 2016-05-10 NOTE — ED Notes (Signed)
Ordered dinner tray.  

## 2016-05-23 ENCOUNTER — Ambulatory Visit: Payer: Self-pay | Admitting: *Deleted

## 2016-08-15 ENCOUNTER — Encounter: Payer: Medicare Other | Attending: Family Medicine | Admitting: *Deleted

## 2016-08-15 DIAGNOSIS — F509 Eating disorder, unspecified: Secondary | ICD-10-CM

## 2016-08-15 DIAGNOSIS — N183 Chronic kidney disease, stage 3 (moderate): Secondary | ICD-10-CM | POA: Insufficient documentation

## 2016-08-15 DIAGNOSIS — E1122 Type 2 diabetes mellitus with diabetic chronic kidney disease: Secondary | ICD-10-CM | POA: Insufficient documentation

## 2016-08-15 DIAGNOSIS — F5000 Anorexia nervosa, unspecified: Secondary | ICD-10-CM | POA: Insufficient documentation

## 2016-08-15 DIAGNOSIS — E43 Unspecified severe protein-calorie malnutrition: Secondary | ICD-10-CM

## 2016-08-15 DIAGNOSIS — Z713 Dietary counseling and surveillance: Secondary | ICD-10-CM | POA: Insufficient documentation

## 2016-08-15 NOTE — Progress Notes (Signed)
Appointment start time: 1415  Appointment end time: 1500  Patient was seen on 08/15/16 for nutrition counseling pertaining to disordered eating  Primary care provider: Dr. Tammi Klippel Therapist: Esperanza Heir   Assessment Was released from psychiatric hospital and ever since mom has been heavily involved in her life.  Mom is buying her food and decides what she eats Has lost weight due to disordered eating behaviors. Has been restricting and exercising  When she got out of the hospital she was dizzy and nauseous.  Was restricting due to nausea.  Kept restricting after she got better.  Is restricting more and more.  Still exercising and still loosing weight.  Lost 16-18 pounds since end of February "mom loves it" that I've lost weight.  Mom doesn't know she's restricting  Still wants to lose more weight   Mental health diagnosis: Anorexia nervosa  Medical Information:  Changes in hair, skin, nails since ED started: none Chewing/swallowing difficulties : none Relux or heartburn: yes Trouble with teeth: none LMP without the use of hormones: NA   Constipation, diarrhea: BM  No laxative.  2 colace twice daily Dizziness sometimes SOB regularly. Has to take breaks in the shower due to rapid HR.  At last PCP visit  She reports that BP was low Energy "ok" Sleeping well.  10-12 hours a night and no naps Positive for cold intolerance Headaches almost daily Difficulty focusing Increased irritabilty  Complains of chest pain- coughing   Dietary assessment: A typical day consists of 2 meals and 0 snacks  Safe foods include: cereal, cafe steamers, goldfish, salad with light dressing Avoided foods include:everything else  24 hour recall:  B: yogurt, goldfish D: salad with cheese, meat, bacon, few crutons, light salad dressing Beverages: water 36 oz   Compensatory behaviors           Restricting (calories, fat, carbs)  Exercise: walk "a lot" walks several hours/day 3  days/week    Estimated energy intake: 500 kcal  Estimated energy needs: 2200 kcal 275 g CHO 110 g pro 73 g fat  Nutrition Diagnosis: NI-1.4 Inadequate energy intake As related to eating disorder.  As evidenced by restricting dietary intake.  Intervention/Goals: Nutrition counseling provided.  Discussed food is fuel and what happens when the body doesn't get enough fuel.  Her physical health is SEVERELY compromised due to starvation.  She needs to eat more Therapist has been recommending a snack; I reiterated that would be a place to start  Also suggested rewriting declaration of independence from her eating disorder   Monitoring and Evaluation: Patient will follow up in 2  weeks.

## 2016-08-15 NOTE — Patient Instructions (Addendum)
Re write declaration of independence You deserve to eat.  You deserve to live.  Food is essential to life  Think about what you want to do Think about adding a snack and start recovery

## 2016-08-22 ENCOUNTER — Encounter: Payer: Medicare Other | Attending: Family Medicine | Admitting: *Deleted

## 2016-08-22 DIAGNOSIS — Z713 Dietary counseling and surveillance: Secondary | ICD-10-CM | POA: Insufficient documentation

## 2016-08-22 DIAGNOSIS — N183 Chronic kidney disease, stage 3 (moderate): Secondary | ICD-10-CM | POA: Insufficient documentation

## 2016-08-22 DIAGNOSIS — F5000 Anorexia nervosa, unspecified: Secondary | ICD-10-CM | POA: Diagnosis not present

## 2016-08-22 DIAGNOSIS — E1122 Type 2 diabetes mellitus with diabetic chronic kidney disease: Secondary | ICD-10-CM | POA: Diagnosis not present

## 2016-08-22 DIAGNOSIS — F509 Eating disorder, unspecified: Secondary | ICD-10-CM

## 2016-08-22 DIAGNOSIS — E43 Unspecified severe protein-calorie malnutrition: Secondary | ICD-10-CM

## 2016-08-22 NOTE — Patient Instructions (Signed)
No exercise.  No walking, dancing, jumping jacks, every time you exercise you take energy from your heart We need to feed your heart.  Please have 2 Ensure daily, plus the food you have been eating

## 2016-08-22 NOTE — Progress Notes (Signed)
Appointment start time: 1400  Appointment end time: 1500  Patient was seen on 08/22/16 for nutrition counseling pertaining to disordered eating  Primary care provider: Dr. Tammi Klippel Therapist: Esperanza Heir   Assessment Poor energy.  Sleeps 12-16 hours at night.  No naps during the day Dizziness a lot Headaches daily Chest pain.  SOB Gets out of breath washing her hands Has stress test ordered for Tuesday to determine cause of SOB- most likely MALNUTRITION.  Dr. Tammi Klippel doesn't know she's restricting  After last visit, she was very mad at this provider so she exercised excessively   EAT-26 score: 50  Mental health diagnosis: Anorexia nervosa   Dietary assessment: A typical day consists of 2 meals and 0 snacks  Safe foods include: cereal, cafe steamers, goldfish, salad with light dressing Avoided foods include:everything else  24 hour recall:  B: 1/2 bowl granola D: cafe steamer Ensure  Today Handful of goldfish and greek yogurt Ensure Beverages: water 36 oz   Compensatory behaviors           Restricting (calories, fat, carbs)  Exercise: walk "a lot" walks several hours/day 3 days/week    Estimated energy intake: 500 kcal  Estimated energy needs: 2200 kcal 275 g CHO 110 g pro 73 g fat  Nutrition Diagnosis: NI-1.4 Inadequate energy intake As related to eating disorder.  As evidenced by restricting dietary intake.  Intervention/Goals: Nutrition counseling provided.   Do NOT exercise.  No movement other than ADLs Nutritional status very poor.  Qualifies as severe malnutrition. If things do not improve, needs higher level of care Challenge ED voice  Add another Ensure.  2 daily, plus food  Monitoring and Evaluation: Patient will follow up in 2  weeks.

## 2016-08-26 ENCOUNTER — Telehealth: Payer: Self-pay | Admitting: *Deleted

## 2016-08-26 NOTE — Telephone Encounter (Signed)
I called PCP office to discuss my concerns about upcomming stress test.  PCP ordered stress test due to pt complaints of SOB/dizziness/chest pain.  PCP was previously unaware of level of calorie restriction, which is more than likely contributing to symptoms.  Pt is winded with showing, dizzy with yawning, and frequently struggling with symptoms with ADLs  Spoke with Ginger at Dr. Johny Shears office who has relayed my concerns to Dr. Tammi Klippel who still would like pt to complete stress test

## 2016-08-28 ENCOUNTER — Encounter: Payer: Medicare Other | Admitting: *Deleted

## 2016-08-28 ENCOUNTER — Telehealth: Payer: Self-pay | Admitting: *Deleted

## 2016-08-28 DIAGNOSIS — Z713 Dietary counseling and surveillance: Secondary | ICD-10-CM | POA: Diagnosis not present

## 2016-08-28 DIAGNOSIS — E43 Unspecified severe protein-calorie malnutrition: Secondary | ICD-10-CM

## 2016-08-28 DIAGNOSIS — F509 Eating disorder, unspecified: Secondary | ICD-10-CM

## 2016-08-28 NOTE — Progress Notes (Signed)
Appointment start time: 0845  Appointment end time: 0930  Patient was seen on 08/28/16 for nutrition counseling pertaining to disordered eating  Primary care provider: Dr. Tammi Klippel Therapist: Esperanza Heir   Assessment Did not do stress test yesterday.  Thought she would probably pass out Is not sleeping well.  Pacing the floor for hours Complains  Of worsening symptoms: more dizzy, more tired, more achey, more chest pain, more SOB  Was not able to eat anymore this past week, but did drink 2 Boost daily and had a cafe steamer or chef salad for dinner. Weight is down. She is deteriorating medically and more than likely needs a higher level of care.  I have spoken with Ginger at Dr. Johny Shears office twice about my concerns.  I attempted to call again today, but was told Ginger was out of the office and was never connected with another nurse.  Elliannah is very sick.  I am very worried about her physical health.  She is severely malnourished.      Mental health diagnosis: Anorexia nervosa   Dietary assessment: A typical day consists of 2 meals and 0 snacks  Safe foods include: cereal, cafe steamers, goldfish, salad with light dressing Avoided foods include:everything else  24 hour recall:  2 Ensure Chef salad     Estimated energy intake: 700 kcal  Estimated energy needs: 2200 kcal 275 g CHO 110 g pro 73 g fat  Nutrition Diagnosis: NI-1.4 Inadequate energy intake As related to eating disorder.  As evidenced by restricting dietary intake.  Intervention/Goals: Nutrition counseling provided.   Do NOT exercise.  No movement other than ADLs Nutritional status very poor.  Qualifies as severe malnutrition. If things do not improve, needs higher level of care Challenge ED voice  Add another Ensure.  3 daily, plus food.  Try zofran for nausea since you already have it Use coping skills to distract from ED behaviors.    Monitoring and Evaluation: Patient will follow up in 1  weeks.

## 2016-08-28 NOTE — Patient Instructions (Signed)
Start taking Zofran for nausea Try Ginger tea also Try probiotics in yogurt, kefir, or ginger tea or Culturelle tablets  Food is your medicine.  The only thing to totally fix your symptoms is food. Need 3 Ensure every day and dinner (Cafe Steamer or Chef Salad)  Please be honest with Dr. Tammi Klippel about how you feel  I'm very proud of you trying this week.  Keep it up!  Please do NOT engage in any unnecessary body movement.  No pacing.   For urge to SIV - put signs back up on bathroom door -          Use the heart pillow I loan her sometimes  -          Remember she does not want to go in the hospital, so she needs to pursue health and stay on track with recovery -          Coloring book -          Re-read Life without Ed

## 2016-08-28 NOTE — Telephone Encounter (Signed)
Attempted to reach dr Johny Shears nurse,ginger to discuss pt's worsening  Condition.  Ginger not in the office and was never connected to another nurse. Call disconnected

## 2016-08-29 ENCOUNTER — Telehealth: Payer: Self-pay | Admitting: *Deleted

## 2016-08-29 NOTE — Telephone Encounter (Signed)
Spoke with Dr. Johny Shears nurse, Ginger.  Brandi Love didn't get stress test done earlier this week.  I am requesting an EKG due to her increased episodes of SOB, chest pain, dizziness, and yesterday she actually collapsed.  Ginger will pass info on to Dr. Tammi Klippel

## 2016-08-29 NOTE — Telephone Encounter (Signed)
PCP called to say EKG normal and is doing blood work.  States Brandi Love is malnourished, but not critical yet.  He will monitor her weekly and will involuntarily hospitalize her if needed. He will also call her mom

## 2016-09-04 ENCOUNTER — Encounter: Payer: Medicare Other | Admitting: *Deleted

## 2016-09-04 DIAGNOSIS — F509 Eating disorder, unspecified: Secondary | ICD-10-CM

## 2016-09-04 DIAGNOSIS — Z713 Dietary counseling and surveillance: Secondary | ICD-10-CM | POA: Diagnosis not present

## 2016-09-04 NOTE — Progress Notes (Signed)
Appointment start time: 0830  Appointment end time: 0915  Patient was seen on 09/04/16 for nutrition counseling pertaining to disordered eating  Primary care provider: Dr. Tammi Klippel Therapist: Esperanza Heir   Assessment Continues to lose weight She is tired and feels poorly.    She continues to decline physically.  She has been drinking 3 Ensure Plus each day and sending me pictures, but she is not getting in enough calories   Mental health diagnosis: Anorexia nervosa   Dietary assessment: A typical day consists of 1 meals and 3 snacks  Safe foods include: cereal, cafe steamers, goldfish, salad with light dressing Avoided foods include:everything else  24 hour recall:  3 Ensure Plus     Estimated energy intake: 1000 kcal  Estimated energy needs: 2200 kcal 275 g CHO 110 g pro 73 g fat  Nutrition Diagnosis: NI-1.4 Inadequate energy intake As related to eating disorder.  As evidenced by restricting dietary intake.  Intervention/Goals: Nutrition counseling provided.   Do NOT exercise.  No movement other than ADLs Nutritional status very poor.  Qualifies as severe malnutrition. If things do not improve, needs higher level of care Challenge ED voice  3 Ensure Plus each day and 2 meals: cereal and cafe steamer or salad or egg rolls    Monitoring and Evaluation: Patient will follow up in 1  weeks.

## 2016-09-05 ENCOUNTER — Telehealth: Payer: Self-pay | Admitting: *Deleted

## 2016-09-05 NOTE — Telephone Encounter (Signed)
Spoke with nurse at PCP office.  Brandi Love can come next week to see [name], [place] to get vitals checked and Dr. Tammi Klippel the following week.  I reiterated my concern for her declining physical state

## 2016-09-09 ENCOUNTER — Telehealth: Payer: Self-pay | Admitting: *Deleted

## 2016-09-09 ENCOUNTER — Ambulatory Visit: Payer: Self-pay | Admitting: *Deleted

## 2016-09-09 NOTE — Telephone Encounter (Signed)
Spoke with nurse at PCP's office to inform of patient's recent hospitalization at [facility] and my desire for her transfer to [facility].

## 2016-09-18 ENCOUNTER — Inpatient Hospital Stay (HOSPITAL_COMMUNITY)
Admission: EM | Admit: 2016-09-18 | Discharge: 2016-09-27 | DRG: 872 | Disposition: A | Discharge status: Home Health | Payer: Medicare Other | Attending: Nephrology | Admitting: Nephrology

## 2016-09-18 ENCOUNTER — Inpatient Hospital Stay (HOSPITAL_COMMUNITY): Payer: Medicare Other

## 2016-09-18 ENCOUNTER — Encounter (HOSPITAL_COMMUNITY): Payer: Self-pay | Admitting: Emergency Medicine

## 2016-09-18 DIAGNOSIS — D649 Anemia, unspecified: Secondary | ICD-10-CM | POA: Diagnosis present

## 2016-09-18 DIAGNOSIS — G43909 Migraine, unspecified, not intractable, without status migrainosus: Secondary | ICD-10-CM | POA: Diagnosis present

## 2016-09-18 DIAGNOSIS — Z452 Encounter for adjustment and management of vascular access device: Secondary | ICD-10-CM

## 2016-09-18 DIAGNOSIS — G40909 Epilepsy, unspecified, not intractable, without status epilepticus: Secondary | ICD-10-CM | POA: Diagnosis present

## 2016-09-18 DIAGNOSIS — F333 Major depressive disorder, recurrent, severe with psychotic symptoms: Secondary | ICD-10-CM | POA: Diagnosis present

## 2016-09-18 DIAGNOSIS — E785 Hyperlipidemia, unspecified: Secondary | ICD-10-CM | POA: Diagnosis present

## 2016-09-18 DIAGNOSIS — F603 Borderline personality disorder: Secondary | ICD-10-CM | POA: Diagnosis present

## 2016-09-18 DIAGNOSIS — E038 Other specified hypothyroidism: Secondary | ICD-10-CM | POA: Diagnosis not present

## 2016-09-18 DIAGNOSIS — R7303 Prediabetes: Secondary | ICD-10-CM | POA: Diagnosis present

## 2016-09-18 DIAGNOSIS — N10 Acute tubulo-interstitial nephritis: Secondary | ICD-10-CM | POA: Diagnosis not present

## 2016-09-18 DIAGNOSIS — H9190 Unspecified hearing loss, unspecified ear: Secondary | ICD-10-CM | POA: Diagnosis not present

## 2016-09-18 DIAGNOSIS — F509 Eating disorder, unspecified: Secondary | ICD-10-CM | POA: Diagnosis present

## 2016-09-18 DIAGNOSIS — Z91013 Allergy to seafood: Secondary | ICD-10-CM

## 2016-09-18 DIAGNOSIS — Z91011 Allergy to milk products: Secondary | ICD-10-CM

## 2016-09-18 DIAGNOSIS — Z888 Allergy status to other drugs, medicaments and biological substances status: Secondary | ICD-10-CM

## 2016-09-18 DIAGNOSIS — H9193 Unspecified hearing loss, bilateral: Secondary | ICD-10-CM | POA: Diagnosis present

## 2016-09-18 DIAGNOSIS — N390 Urinary tract infection, site not specified: Secondary | ICD-10-CM | POA: Diagnosis not present

## 2016-09-18 DIAGNOSIS — K219 Gastro-esophageal reflux disease without esophagitis: Secondary | ICD-10-CM | POA: Diagnosis present

## 2016-09-18 DIAGNOSIS — Z881 Allergy status to other antibiotic agents status: Secondary | ICD-10-CM

## 2016-09-18 DIAGNOSIS — I10 Essential (primary) hypertension: Secondary | ICD-10-CM | POA: Diagnosis not present

## 2016-09-18 DIAGNOSIS — G4700 Insomnia, unspecified: Secondary | ICD-10-CM | POA: Diagnosis present

## 2016-09-18 DIAGNOSIS — A419 Sepsis, unspecified organism: Secondary | ICD-10-CM | POA: Diagnosis not present

## 2016-09-18 DIAGNOSIS — F23 Brief psychotic disorder: Secondary | ICD-10-CM | POA: Diagnosis not present

## 2016-09-18 DIAGNOSIS — R319 Hematuria, unspecified: Secondary | ICD-10-CM | POA: Diagnosis not present

## 2016-09-18 DIAGNOSIS — Z6829 Body mass index (BMI) 29.0-29.9, adult: Secondary | ICD-10-CM

## 2016-09-18 DIAGNOSIS — R569 Unspecified convulsions: Secondary | ICD-10-CM | POA: Diagnosis not present

## 2016-09-18 DIAGNOSIS — G894 Chronic pain syndrome: Secondary | ICD-10-CM | POA: Diagnosis not present

## 2016-09-18 DIAGNOSIS — Z7982 Long term (current) use of aspirin: Secondary | ICD-10-CM

## 2016-09-18 DIAGNOSIS — G8929 Other chronic pain: Secondary | ICD-10-CM | POA: Diagnosis present

## 2016-09-18 DIAGNOSIS — Z91048 Other nonmedicinal substance allergy status: Secondary | ICD-10-CM

## 2016-09-18 DIAGNOSIS — E039 Hypothyroidism, unspecified: Secondary | ICD-10-CM | POA: Diagnosis present

## 2016-09-18 DIAGNOSIS — R05 Cough: Secondary | ICD-10-CM | POA: Diagnosis present

## 2016-09-18 DIAGNOSIS — I129 Hypertensive chronic kidney disease with stage 1 through stage 4 chronic kidney disease, or unspecified chronic kidney disease: Secondary | ICD-10-CM | POA: Diagnosis present

## 2016-09-18 DIAGNOSIS — F419 Anxiety disorder, unspecified: Secondary | ICD-10-CM | POA: Diagnosis present

## 2016-09-18 DIAGNOSIS — N183 Chronic kidney disease, stage 3 unspecified: Secondary | ICD-10-CM | POA: Diagnosis present

## 2016-09-18 DIAGNOSIS — Z915 Personal history of self-harm: Secondary | ICD-10-CM

## 2016-09-18 DIAGNOSIS — E108 Type 1 diabetes mellitus with unspecified complications: Secondary | ICD-10-CM | POA: Diagnosis not present

## 2016-09-18 DIAGNOSIS — R109 Unspecified abdominal pain: Secondary | ICD-10-CM | POA: Diagnosis not present

## 2016-09-18 DIAGNOSIS — E872 Acidosis: Secondary | ICD-10-CM | POA: Diagnosis present

## 2016-09-18 DIAGNOSIS — E860 Dehydration: Secondary | ICD-10-CM | POA: Diagnosis present

## 2016-09-18 DIAGNOSIS — Z9621 Cochlear implant status: Secondary | ICD-10-CM | POA: Diagnosis present

## 2016-09-18 DIAGNOSIS — Z22322 Carrier or suspected carrier of Methicillin resistant Staphylococcus aureus: Secondary | ICD-10-CM

## 2016-09-18 DIAGNOSIS — F321 Major depressive disorder, single episode, moderate: Secondary | ICD-10-CM | POA: Diagnosis not present

## 2016-09-18 DIAGNOSIS — Z886 Allergy status to analgesic agent status: Secondary | ICD-10-CM

## 2016-09-18 DIAGNOSIS — I2699 Other pulmonary embolism without acute cor pulmonale: Secondary | ICD-10-CM | POA: Diagnosis not present

## 2016-09-18 HISTORY — DX: Personal history of urinary calculi: Z87.442

## 2016-09-18 HISTORY — DX: Other specified postprocedural states: Z98.890

## 2016-09-18 HISTORY — DX: Nausea with vomiting, unspecified: R11.2

## 2016-09-18 HISTORY — DX: Low back pain, unspecified: M54.50

## 2016-09-18 HISTORY — DX: Pneumonia, unspecified organism: J18.9

## 2016-09-18 HISTORY — DX: Type 2 diabetes mellitus without complications: E11.9

## 2016-09-18 HISTORY — DX: Unspecified osteoarthritis, unspecified site: M19.90

## 2016-09-18 HISTORY — DX: Other chronic pain: G89.29

## 2016-09-18 HISTORY — DX: Low back pain: M54.5

## 2016-09-18 LAB — LACTIC ACID, PLASMA: Lactic Acid, Venous: 2.6 mmol/L (ref 0.5–1.9)

## 2016-09-18 LAB — COMPREHENSIVE METABOLIC PANEL
ALBUMIN: 3.4 g/dL — AB (ref 3.5–5.0)
ALBUMIN: 2.9 g/dL — AB (ref 3.5–5.0)
ALK PHOS: 69 U/L (ref 38–126)
ALT: 19 U/L (ref 14–54)
ALT: 17 U/L (ref 14–54)
ANION GAP: 10 (ref 5–15)
AST: 15 U/L (ref 15–41)
AST: 18 U/L (ref 15–41)
Alkaline Phosphatase: 66 U/L (ref 38–126)
Anion gap: 12 (ref 5–15)
BILIRUBIN TOTAL: 0.9 mg/dL (ref 0.3–1.2)
BILIRUBIN TOTAL: 0.5 mg/dL (ref 0.3–1.2)
BUN: 17 mg/dL (ref 6–20)
BUN: 14 mg/dL (ref 6–20)
CALCIUM: 9.4 mg/dL (ref 8.9–10.3)
CO2: 21 mmol/L — ABNORMAL LOW (ref 22–32)
CO2: 21 mmol/L — ABNORMAL LOW (ref 22–32)
CREATININE: 1.9 mg/dL — AB (ref 0.44–1.00)
Calcium: 8.5 mg/dL — ABNORMAL LOW (ref 8.9–10.3)
Chloride: 101 mmol/L (ref 101–111)
Chloride: 108 mmol/L (ref 101–111)
Creatinine, Ser: 1.66 mg/dL — ABNORMAL HIGH (ref 0.44–1.00)
GFR calc Af Amer: 38 mL/min — ABNORMAL LOW (ref >60)
GFR calc Af Amer: 44 mL/min — ABNORMAL LOW (ref >60)
GFR calc non Af Amer: 32 mL/min — ABNORMAL LOW (ref >60)
GFR calc non Af Amer: 38 mL/min — ABNORMAL LOW (ref >60)
GLUCOSE: 132 mg/dL — AB (ref 65–99)
GLUCOSE: 126 mg/dL — AB (ref 65–99)
Potassium: 4.5 mmol/L (ref 3.5–5.1)
Potassium: 4 mmol/L (ref 3.5–5.1)
Sodium: 134 mmol/L — ABNORMAL LOW (ref 135–145)
Sodium: 139 mmol/L (ref 135–145)
TOTAL PROTEIN: 6.4 g/dL — AB (ref 6.5–8.1)
TOTAL PROTEIN: 6 g/dL — AB (ref 6.5–8.1)

## 2016-09-18 LAB — I-STAT BETA HCG BLOOD, ED (MC, WL, AP ONLY)

## 2016-09-18 LAB — URINALYSIS, ROUTINE W REFLEX MICROSCOPIC
BILIRUBIN URINE: NEGATIVE
Glucose, UA: NEGATIVE mg/dL
KETONES UR: NEGATIVE mg/dL
NITRITE: POSITIVE — AB
Protein, ur: 30 mg/dL — AB
SPECIFIC GRAVITY, URINE: 1.011 (ref 1.005–1.030)
pH: 5 (ref 5.0–8.0)

## 2016-09-18 LAB — PROTIME-INR
INR: 1.13
Prothrombin Time: 14.5 seconds (ref 11.4–15.2)

## 2016-09-18 LAB — I-STAT CG4 LACTIC ACID, ED
Lactic Acid, Venous: 2.69 mmol/L (ref 0.5–1.9)
Lactic Acid, Venous: 2.77 mmol/L (ref 0.5–1.9)

## 2016-09-18 LAB — LIPASE, BLOOD: Lipase: 37 U/L (ref 11–51)

## 2016-09-18 LAB — PROCALCITONIN: Procalcitonin: 0.31 ng/mL

## 2016-09-18 LAB — CBC
HEMATOCRIT: 38.8 % (ref 36.0–46.0)
Hemoglobin: 13.2 g/dL (ref 12.0–15.0)
MCH: 31.1 pg (ref 26.0–34.0)
MCHC: 34 g/dL (ref 30.0–36.0)
MCV: 91.3 fL (ref 78.0–100.0)
Platelets: 245 10*3/uL (ref 150–400)
RBC: 4.25 MIL/uL (ref 3.87–5.11)
RDW: 13.7 % (ref 11.5–15.5)
WBC: 12.4 10*3/uL — ABNORMAL HIGH (ref 4.0–10.5)

## 2016-09-18 LAB — APTT: aPTT: 44 seconds — ABNORMAL HIGH (ref 24–36)

## 2016-09-18 MED ORDER — SODIUM CHLORIDE 0.9 % IV BOLUS (SEPSIS)
250.0000 mL | Freq: Once | INTRAVENOUS | Status: AC
  Administered 2016-09-18: 250 mL via INTRAVENOUS

## 2016-09-18 MED ORDER — SODIUM CHLORIDE 0.9 % IV BOLUS (SEPSIS)
1000.0000 mL | Freq: Once | INTRAVENOUS | Status: AC
  Administered 2016-09-18: 1000 mL via INTRAVENOUS

## 2016-09-18 MED ORDER — TOPIRAMATE 100 MG PO TABS
50.0000 mg | ORAL_TABLET | Freq: Two times a day (BID) | ORAL | Status: DC
  Administered 2016-09-19 – 2016-09-26 (×10): 50 mg via ORAL
  Filled 2016-09-18 (×2): qty 2
  Filled 2016-09-18 (×4): qty 1
  Filled 2016-09-18: qty 2
  Filled 2016-09-18: qty 1
  Filled 2016-09-18 (×2): qty 2
  Filled 2016-09-18 (×2): qty 1
  Filled 2016-09-18: qty 2
  Filled 2016-09-18 (×2): qty 1

## 2016-09-18 MED ORDER — ASPIRIN EC 81 MG PO TBEC
81.0000 mg | DELAYED_RELEASE_TABLET | Freq: Every day | ORAL | Status: DC
  Administered 2016-09-21 – 2016-09-25 (×5): 81 mg via ORAL
  Filled 2016-09-18 (×6): qty 1

## 2016-09-18 MED ORDER — LAMOTRIGINE 100 MG PO TABS
200.0000 mg | ORAL_TABLET | Freq: Every day | ORAL | Status: DC
  Administered 2016-09-22: 200 mg via ORAL
  Administered 2016-09-24: 50 mg via ORAL
  Administered 2016-09-25 – 2016-09-26 (×2): 200 mg via ORAL
  Filled 2016-09-18 (×4): qty 2
  Filled 2016-09-18: qty 1
  Filled 2016-09-18 (×5): qty 2

## 2016-09-18 MED ORDER — MELATONIN 10 MG PO TABS: 10.0000 mg | ORAL_TABLET | Freq: Every day | ORAL | Status: DC

## 2016-09-18 MED ORDER — ACETAMINOPHEN 650 MG RE SUPP
650.0000 mg | Freq: Four times a day (QID) | RECTAL | Status: DC | PRN
  Administered 2016-09-18 – 2016-09-19 (×3): 650 mg via RECTAL
  Filled 2016-09-18 (×3): qty 1

## 2016-09-18 MED ORDER — LURASIDONE HCL 20 MG PO TABS
40.0000 mg | ORAL_TABLET | Freq: Every morning | ORAL | Status: DC
  Administered 2016-09-22 – 2016-09-26 (×3): 40 mg via ORAL
  Filled 2016-09-18 (×9): qty 2

## 2016-09-18 MED ORDER — CYCLOBENZAPRINE HCL 10 MG PO TABS
10.0000 mg | ORAL_TABLET | Freq: Two times a day (BID) | ORAL | Status: DC | PRN
  Filled 2016-09-18: qty 1

## 2016-09-18 MED ORDER — MELATONIN 3 MG PO TABS
9.0000 mg | ORAL_TABLET | Freq: Every day | ORAL | Status: DC
  Administered 2016-09-18 – 2016-09-26 (×9): 9 mg via ORAL
  Filled 2016-09-18 (×9): qty 3

## 2016-09-18 MED ORDER — ENSURE ENLIVE PO LIQD
237.0000 mL | Freq: Two times a day (BID) | ORAL | Status: DC
  Administered 2016-09-18 – 2016-09-20 (×4): 237 mL via ORAL

## 2016-09-18 MED ORDER — SUMATRIPTAN SUCCINATE 100 MG PO TABS
100.0000 mg | ORAL_TABLET | ORAL | Status: DC | PRN
  Administered 2016-09-26: 100 mg via ORAL
  Filled 2016-09-18 (×3): qty 1

## 2016-09-18 MED ORDER — VENLAFAXINE HCL ER 75 MG PO CP24
150.0000 mg | ORAL_CAPSULE | Freq: Every day | ORAL | Status: DC
  Administered 2016-09-19 – 2016-09-26 (×3): 150 mg via ORAL
  Filled 2016-09-18: qty 1
  Filled 2016-09-18 (×2): qty 2
  Filled 2016-09-18: qty 1
  Filled 2016-09-18 (×3): qty 2
  Filled 2016-09-18: qty 1
  Filled 2016-09-18 (×2): qty 2
  Filled 2016-09-18: qty 1
  Filled 2016-09-18: qty 2

## 2016-09-18 MED ORDER — PROMETHAZINE HCL 25 MG PO TABS: 25.0000 mg | ORAL_TABLET | Freq: Four times a day (QID) | ORAL | Status: DC | PRN

## 2016-09-18 MED ORDER — HALOPERIDOL 5 MG PO TABS
5.0000 mg | ORAL_TABLET | Freq: Every day | ORAL | Status: DC
  Filled 2016-09-18 (×3): qty 1

## 2016-09-18 MED ORDER — CHLORHEXIDINE GLUCONATE CLOTH 2 % EX PADS
6.0000 | MEDICATED_PAD | Freq: Every day | CUTANEOUS | Status: DC
  Administered 2016-09-18 – 2016-09-25 (×6): 6 via TOPICAL

## 2016-09-18 MED ORDER — ACETAMINOPHEN 650 MG RE SUPP
650.0000 mg | Freq: Once | RECTAL | Status: AC
  Administered 2016-09-18: 650 mg via RECTAL
  Filled 2016-09-18: qty 1

## 2016-09-18 MED ORDER — DEXTROSE 5 % IV SOLN
2.0000 g | Freq: Once | INTRAVENOUS | Status: AC
  Administered 2016-09-18: 2 g via INTRAVENOUS
  Filled 2016-09-18: qty 2

## 2016-09-18 MED ORDER — ATORVASTATIN CALCIUM 20 MG PO TABS
20.0000 mg | ORAL_TABLET | Freq: Every day | ORAL | Status: DC
  Administered 2016-09-22 – 2016-09-24 (×2): 20 mg via ORAL
  Filled 2016-09-18 (×3): qty 1

## 2016-09-18 MED ORDER — SODIUM CHLORIDE 0.9 % IV SOLN
INTRAVENOUS | Status: DC
  Administered 2016-09-18 – 2016-09-25 (×10): via INTRAVENOUS

## 2016-09-18 MED ORDER — SODIUM CHLORIDE 0.9 % IV BOLUS (SEPSIS)
500.0000 mL | Freq: Once | INTRAVENOUS | Status: AC
  Administered 2016-09-18: 500 mL via INTRAVENOUS

## 2016-09-18 MED ORDER — CEFTRIAXONE SODIUM 1 G IJ SOLR
1.0000 g | INTRAMUSCULAR | Status: DC
  Filled 2016-09-18: qty 10

## 2016-09-18 MED ORDER — SODIUM CHLORIDE 0.9% FLUSH
10.0000 mL | Freq: Two times a day (BID) | INTRAVENOUS | Status: DC
  Administered 2016-09-18 – 2016-09-23 (×9): 10 mL

## 2016-09-18 MED ORDER — PANTOPRAZOLE SODIUM 40 MG PO TBEC
40.0000 mg | DELAYED_RELEASE_TABLET | Freq: Every day | ORAL | Status: DC
  Administered 2016-09-22 – 2016-09-26 (×4): 40 mg via ORAL
  Filled 2016-09-18 (×9): qty 1

## 2016-09-18 MED ORDER — LEVOTHYROXINE SODIUM 150 MCG PO TABS
150.0000 ug | ORAL_TABLET | Freq: Every day | ORAL | Status: DC
  Administered 2016-09-19 – 2016-09-20 (×2): 150 ug via ORAL
  Filled 2016-09-18: qty 2
  Filled 2016-09-18: qty 1
  Filled 2016-09-18: qty 2
  Filled 2016-09-18: qty 1

## 2016-09-18 MED ORDER — QUETIAPINE FUMARATE ER 50 MG PO TB24: 150.0000 mg | ORAL_TABLET | Freq: Every day | ORAL | Status: DC

## 2016-09-18 MED ORDER — SODIUM CHLORIDE 0.9% FLUSH
10.0000 mL | INTRAVENOUS | Status: DC | PRN
  Administered 2016-09-24: 3 mL
  Filled 2016-09-18: qty 40

## 2016-09-18 MED ORDER — PANTOPRAZOLE SODIUM 40 MG PO TBEC: 40.0000 mg | DELAYED_RELEASE_TABLET | Freq: Every day | ORAL | Status: DC

## 2016-09-18 MED ORDER — HYDROXYZINE HCL 25 MG PO TABS
50.0000 mg | ORAL_TABLET | Freq: Three times a day (TID) | ORAL | Status: DC | PRN
  Administered 2016-09-19: 50 mg via ORAL
  Filled 2016-09-18 (×2): qty 2

## 2016-09-18 MED ORDER — HEPARIN SODIUM (PORCINE) 5000 UNIT/ML IJ SOLN
5000.0000 [IU] | Freq: Three times a day (TID) | INTRAMUSCULAR | Status: DC
  Administered 2016-09-18 – 2016-09-25 (×11): 5000 [IU] via SUBCUTANEOUS
  Filled 2016-09-18 (×12): qty 1

## 2016-09-18 MED ORDER — ACETAMINOPHEN 325 MG PO TABS
650.0000 mg | ORAL_TABLET | Freq: Four times a day (QID) | ORAL | Status: DC | PRN
  Filled 2016-09-18: qty 2

## 2016-09-18 MED ORDER — QUETIAPINE FUMARATE ER 50 MG PO TB24
150.0000 mg | ORAL_TABLET | Freq: Every day | ORAL | Status: DC
  Administered 2016-09-22: 150 mg via ORAL
  Filled 2016-09-18 (×9): qty 3

## 2016-09-18 NOTE — ED Notes (Signed)
46M to call back for report, no bed in room

## 2016-09-18 NOTE — ED Notes (Signed)
PT assisted to bedside toilet. PT does not provide much assistance. PT is reluctant to stand up and lets her head fall forward when she sits on bedside. PT then straightens and sits upright.

## 2016-09-18 NOTE — ED Notes (Signed)
PT provides 5cc urine

## 2016-09-18 NOTE — ED Notes (Signed)
Per Merry Proud, PA lactic due at 1451 is not needed. This lab was just drawn

## 2016-09-18 NOTE — ED Notes (Signed)
PT assisted to bedside commode. PT required two people to assist. PT voids approx 200 cc urine

## 2016-09-18 NOTE — Progress Notes (Signed)
Pt refused PO meds and said it hurts her stomach to take oral medications

## 2016-09-18 NOTE — H&P (Signed)
History and Physical    Tiye Huwe Riker HYW:[id number] DOB: October 06, 1975 DOA: 09/18/2016  PCP: Vickii Penna, MD Patient coming from: Home  Chief Complaint: not eating or drinking  HPI: Emanuela Runnion Ayuso is a 41 y.o. female with medical history significant of anxiety/depression, deafness, C KD, diabetes, eating disorders, endometriosis, GERD, hyperlipidemia, hypertension, hypothyroidism, migraines, DVT/pulmonary embolus, seizures. Patient attended to at time of presentation by her in-home health care aide. Limited history provided by patient due to acuity of illness, difficulty hearing and patient not wanting to participate in exam. Patient with very little oral intake over the last 2 days. Intermittent dry cough over the last days. Suprapubic abdominal pain consistent with the patient describes as her UTI symptoms. Urine is become very dark, odorous and quality. Denies any fevers, chest pain, palpitations, diarrhea, focal neurological deficit, headache. Does endorse bilateral flank pain.  No further history able to be obtained.   ED Course: Sepsis protocol initiated and patient started on Rocephin. Fluid boluses given  Review of Systems: As per HPI otherwise all other systems reviewed and are negative  Ambulatory Status: No restrictions  Past Medical History:  Diagnosis Date  . Anemia   . Anxiety   . Chronic renal insufficiency   . Deafness   . Depression   . Diabetes mellitus without complication ([place])   . Eating disorder   . Endometriosis   . GERD (gastroesophageal reflux disease)   . Hyperlipidemia   . Hypertension   . 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 Migraine   . OCD (obsessive compulsive disorder)   . Pulmonary embolism ([place])   . Seizures ([place])   . Tachycardia     Past Surgical History:  Procedure Laterality Date  . ABDOMINAL HYSTERECTOMY     'except for right ovary'  . BREAST LUMPECTOMY Left 2000   x2  . BREAST LUMPECTOMY Right 2002   benign  .  COCHLEAR IMPLANT    . EYE SURGERY Left 1980  . EYE SURGERY Right 1990  . LAPAROSCOPIC ABDOMINAL EXPLORATION  2001 and 2002   for endometriosis    Social History   Social History  . Marital status: Single    Spouse name: N/A  . Number of children: 0  . Years of education: 14   Occupational History  . Disabled.    Social History Main Topics  . Smoking status: Never Smoker  . Smokeless tobacco: Never Used  . Alcohol use No  . Drug use: No  . Sexual activity: No   Other Topics Concern  . Not on file   Social History Narrative   Lives alone.  Single.  No family in town.      Allergies  Allergen Reactions  . Dhea [Nutritional Supplements] Nausea And Vomiting  . Entex Lq [Phenylephrine-Guaifenesin] Shortness Of Breath and Swelling    Throat swelling  . Indomethacin Nausea And Vomiting  . Iodinated Diagnostic Agents Anaphylaxis    'cant breathe'  . Propoxyphene Anaphylaxis  . Risperidone And Related Other (See Comments)    Out of body experience  . Shellfish Allergy Other (See Comments)    unspecified  . Depakote [Divalproex Sodium] Nausea And Vomiting and Other (See Comments)    Weight gain  . Abilify [Aripiprazole] Nausea And Vomiting  . Dihydroergotamine Nausea And Vomiting  . Doxycycline Nausea And Vomiting  . Erythromycin Nausea And Vomiting  . Lactose Intolerance (Gi) Nausea And Vomiting  . Nsaids Other (See Comments)    Unable  to ttake due to renal insufficency  . Tape Other (See Comments)    Redness, can use paper tape    Family History  Problem Relation Age of Onset  . Cancer Father   . Hyperlipidemia Father   . Osteoarthritis Mother   . Hyperlipidemia Mother     Prior to Admission medications   Medication Sig Start Date End Date Taking? Authorizing Provider  aspirin EC 81 MG tablet Take 81 mg by mouth at bedtime.    Yes [provider]  atorvastatin (LIPITOR) 20 MG tablet Take 20 mg by mouth daily.   Yes [provider]    cetirizine (ZYRTEC) 10 MG tablet Take 10 mg by mouth daily.   Yes [provider]  cyclobenzaprine (FLEXERIL) 10 MG tablet Take 1 tablet (10 mg total) by mouth 2 (two) times daily as needed for muscle spasms. 07/17/15  Yes Mackuen, Courteney Lyn, MD  docusate sodium (COLACE) 100 MG capsule Take 100 mg by mouth 2 (two) times daily.   Yes [provider]  haloperidol (HALDOL) 5 MG tablet Take 5 mg by mouth at bedtime.    Yes [provider]  hydrOXYzine (ATARAX/VISTARIL) 50 MG tablet Take 50 mg by mouth 3 (three) times daily as needed.   Yes [provider]  lamoTRIgine (LAMICTAL) 200 MG tablet Take 200 mg by mouth daily.   Yes [provider]  levothyroxine (SYNTHROID, LEVOTHROID) 150 MCG tablet Take 150 mcg by mouth daily.   Yes [provider]  lurasidone (LATUDA) 20 MG TABS tablet Take 40 mg by mouth.    Yes [provider]  magnesium oxide (MAG-OX) 400 MG tablet Take 400 mg by mouth 2 (two) times daily.    Yes [provider]  Melatonin 10 MG TABS Take 10 mg by mouth at bedtime.   Yes [provider]  metoprolol succinate (TOPROL-XL) 50 MG 24 hr tablet Take 25 mg by mouth every evening.    Yes [provider]  Multiple Vitamin (MULTIVITAMIN WITH MINERALS) TABS Take 1 tablet by mouth daily.   Yes [provider]  omeprazole (PRILOSEC) 20 MG capsule Take 20 mg by mouth daily.   Yes [provider]  ondansetron (ZOFRAN) 4 MG tablet Take 4 mg by mouth every 8 (eight) hours as needed for nausea.   Yes [provider]  pantoprazole (PROTONIX) 40 MG tablet Take 40 mg by mouth daily.   Yes [provider]  pravastatin (PRAVACHOL) 20 MG tablet Take 20 mg by mouth every evening.   Yes [provider]  promethazine (PHENERGAN) 25 MG tablet Take 1 tablet (25 mg total) by mouth every 6 (six) hours as needed for nausea or vomiting. 06/24/14  Yes Szekalski, Kaitlyn, PA-C   QUEtiapine Fumarate (SEROQUEL XR) 150 MG 24 hr tablet Take 150 mg by mouth at bedtime.   Yes [provider]  SUMAtriptan (IMITREX) 100 MG tablet Take 100 mg by mouth every 2 (two) hours as needed for migraine.   Yes [provider]  topiramate (TOPAMAX) 50 MG tablet Take 50 mg by mouth 2 (two) times daily.    Yes [provider]  venlafaxine XR (EFFEXOR-XR) 150 MG 24 hr capsule Take 150 mg by mouth daily with breakfast.   Yes [provider]  Vitamin D, Ergocalciferol, (DRISDOL) 50000 UNITS CAPS Take 50,000 Units by mouth every 7 (seven) days. On Sunday   Yes [provider]  oxyCODONE-acetaminophen (PERCOCET/ROXICET) 5-325 MG tablet Take 1 tablet by  mouth every 6 (six) hours as needed for severe pain. Patient not taking: Reported on 07/26/2015 07/17/15   Macarthur Critchley, MD    Physical Exam: Vitals:   09/18/16 1530 09/18/16 1545 09/18/16 1615 09/18/16 1645  BP: 115/74 119/74 108/67 105/71  Pulse: (!) 115 (!) 110 (!) 110 (!) 105  Resp: (!) 23 (!) 21 (!) 23 18  Temp:      TempSrc:      SpO2: 97% 99% 98% 99%     General: Ill-appearing, resting in bed. Easily arousable. Eyes:  PERRL, EOMI, normal lids, iris ENT:  Poor hearing , dry mm Neck:  no LAD, masses or thyromegaly Cardiovascular:  RRR, no m/r/g. No LE edema.  Respiratory:  CTA bilaterally, no w/r/r. Normal respiratory effort. Abdomen: Suprapubic tenderness to palpation. Normal active bowel sounds, nondistended. Skin: no rash or induration seen on limited exam Musculoskeletal:  grossly normal tone BUE/BLE, good ROM, no bony abnormality Psychiatric:  Decreased attentiveness or at least being withdrawn. Follows basic commands. Neurologic:  CN 2-12 grossly intact, moves all extremities in coordinated fashion, sensation intact  Labs on Admission: I have personally reviewed following labs and imaging studies  CBC:  Recent Labs Lab 09/18/16 1104  WBC 12.4*  HGB 13.2  HCT 38.8   MCV 91.3  PLT 793   Basic Metabolic Panel:  Recent Labs Lab 09/18/16 1104  NA 134*  K 4.5  CL 101  CO2 21*  GLUCOSE 132*  BUN 17  CREATININE 1.90*  CALCIUM 9.4   GFR: CrCl cannot be calculated (Unknown ideal weight.). Liver Function Tests:  Recent Labs Lab 09/18/16 1104  AST 15  ALT 19  ALKPHOS 69  BILITOT 0.9  PROT 6.4*  ALBUMIN 3.4*    Recent Labs Lab 09/18/16 1104  LIPASE 37   No results for input(s): AMMONIA in the last 168 hours. Coagulation Profile: No results for input(s): INR, PROTIME in the last 168 hours. Cardiac Enzymes: No results for input(s): CKTOTAL, CKMB, CKMBINDEX, TROPONINI in the last 168 hours. BNP (last 3 results) No results for input(s): PROBNP in the last 8760 hours. HbA1C: No results for input(s): HGBA1C in the last 72 hours. CBG: No results for input(s): GLUCAP in the last 168 hours. Lipid Profile: No results for input(s): CHOL, HDL, LDLCALC, TRIG, CHOLHDL, LDLDIRECT in the last 72 hours. Thyroid Function Tests: No results for input(s): TSH, T4TOTAL, FREET4, T3FREE, THYROIDAB in the last 72 hours. Anemia Panel: No results for input(s): VITAMINB12, FOLATE, FERRITIN, TIBC, IRON, RETICCTPCT in the last 72 hours. Urine analysis:    Component Value Date/Time   COLORURINE YELLOW 09/18/2016 1354   APPEARANCEUR CLOUDY (A) 09/18/2016 1354   LABSPEC 1.011 09/18/2016 1354   PHURINE 5.0 09/18/2016 1354   GLUCOSEU NEGATIVE 09/18/2016 1354   HGBUR MODERATE (A) 09/18/2016 1354   BILIRUBINUR NEGATIVE 09/18/2016 1354   KETONESUR NEGATIVE 09/18/2016 1354   PROTEINUR 30 (A) 09/18/2016 1354   UROBILINOGEN 0.2 12/14/2012 0816   NITRITE POSITIVE (A) 09/18/2016 1354   LEUKOCYTESUR LARGE (A) 09/18/2016 1354    Creatinine Clearance: CrCl cannot be calculated (Unknown ideal weight.).  Sepsis Labs: @LABRCNTIP (procalcitonin:4,lacticidven:4) )No results found for this or any previous visit (from the past 240 hour(s)).   Radiological Exams on  Admission: No results found.  EKG: Independently reviewed. Sinus. No ACS  Assessment/Plan Active Problems:   Borderline personality disorder   CKD (chronic kidney disease) stage 3, GFR 30-59 ml/min   Hypothyroidism   MDD (major depressive disorder), recurrent, severe, with psychosis (  [place])   Sepsis ([place])   Acute pyelonephritis   Sepsis: likely from Pyelo. So spoke on initiated in the ED. IV fluids and Rocephin initiated by EDP. Lactic acid 2.77, hypotensive, tachycardiac, febrile to 101.7, WBC 12.4, UA grossly abnormal, suprapubic and CVA tenderness noted bilaterally. - Sepsis protocol - Continue Rocephin - IVF - Follow-up urine cultures and blood cultures  Depression/Eating disorder/HA: at baseline - Continue Seroquel, Latuda, Haldol, Effexor, Topamax, Imitrex  CKD: Cr 1.9. At [place] - BMET in am  Seizures: - Continue Lamictal  Hypothyroidism,: - Continue Synthroid  Hypertension: - Hold metoprolol due to hypotension  Chronic pain: - Continue Flexeril  Hyperlipidemia: -Basic continue Lipitor  GERD: -Continue protonix  Insomnia: - continue melatonoin  Prediabetes: Diet controlled. - CBG before meals at bedtime  DVT prophylaxis: hep  Code Status: full  Family Communication: none  Disposition Plan: pending improvement  Consults called: none  Admission status: inpt    Leiah Giannotti J MD [facility]  If 7PM-7AM, please contact night-coverage [email or web address] Password [email or web address]  09/18/2016, 5:35 PM

## 2016-09-18 NOTE — ED Provider Notes (Signed)
[place] DEPT Provider Note   CSN: [id number] Arrival date & time: 09/18/16  1057     History   Chief Complaint Chief Complaint  Patient presents with  . Eating Disorder    HPI Brandi Love is a 41 y.o. female.  HPI    41 year old female with a history of multiple neuropsychiatric disorders including hearing impairment, seizures, anorexia nervosa, borderline personality disorder, compulsive skin picking, depression, obsessive-compulsive disorder, diabetes type 2, hypertension, hyperlipidemia, hypothyroidism presents today with fever. Patient is accompanied by caretaker reports she saw HER-2 days ago. She notes that patient has not wanted to eat or drink, but is tolerating small amounts of fluids. Patient and she has not had any solids in 2 weeks. Patient notes that she's had a dry nonproductive cough over the last several days, also notes pain over her bladder that feels like previous urinary tract infections. She notes her urine has been dark, odorous and cloudy. No documented fever at home.   Past Medical History:  Diagnosis Date  . Anemia   . Anxiety   . Chronic renal insufficiency   . Deafness   . Depression   . Diabetes mellitus without complication ([place])   . Eating disorder   . Endometriosis   . GERD (gastroesophageal reflux disease)   . Hyperlipidemia   . Hypertension   . 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 Migraine   . OCD (obsessive compulsive disorder)   . Pulmonary embolism ([place])   . Seizures ([place])   . Tachycardia     Patient Active Problem List   Diagnosis Date Noted  . Sepsis ([place]) 09/18/2016  . MDD (major depressive disorder), recurrent, severe, with psychosis ([place]) 12/11/2014  . Eating disorder, unspecified 11/23/2012  . Unspecified hypothyroidism 11/23/2012  . Dyslipidemia 11/23/2012  . Sinus tachycardia 11/23/2012  . CKD (chronic kidney disease) stage 3, GFR 30-59 ml/min 11/18/2012  . Protein-calorie malnutrition, severe  ([place]) 11/13/2012  . Abdominal pain, epigastric 11/12/2012  . Dehydration 11/12/2012  . Acute on chronic renal failure ([place]) 11/12/2012  . Leukocytosis, unspecified 11/12/2012  . Hyponatremia 11/12/2012  . Borderline personality disorder 11/12/2012    Past Surgical History:  Procedure Laterality Date  . ABDOMINAL HYSTERECTOMY     'except for right ovary'  . BREAST LUMPECTOMY Left 2000   x2  . BREAST LUMPECTOMY Right 2002   benign  . COCHLEAR IMPLANT    . EYE SURGERY Left 1980  . EYE SURGERY Right 1990  . LAPAROSCOPIC ABDOMINAL EXPLORATION  2001 and 2002   for endometriosis    OB History    No data available       Home Medications    Prior to Admission medications   Medication Sig Start Date End Date Taking? Authorizing Provider  aspirin EC 81 MG tablet Take 81 mg by mouth at bedtime.    Yes [provider]  atorvastatin (LIPITOR) 20 MG tablet Take 20 mg by mouth daily.   Yes [provider]  cetirizine (ZYRTEC) 10 MG tablet Take 10 mg by mouth daily.   Yes [provider]  cyclobenzaprine (FLEXERIL) 10 MG tablet Take 1 tablet (10 mg total) by mouth 2 (two) times daily as needed for muscle spasms. 07/17/15  Yes Mackuen, Courteney Lyn, MD  docusate sodium (COLACE) 100 MG capsule Take 100 mg by mouth 2 (two) times daily.   Yes [provider]  haloperidol (HALDOL) 5 MG tablet Take 5 mg by mouth at bedtime.  Yes [provider]  hydrOXYzine (ATARAX/VISTARIL) 50 MG tablet Take 50 mg by mouth 3 (three) times daily as needed.   Yes [provider]  lamoTRIgine (LAMICTAL) 200 MG tablet Take 200 mg by mouth daily.   Yes [provider]  levothyroxine (SYNTHROID, LEVOTHROID) 150 MCG tablet Take 150 mcg by mouth daily.   Yes [provider]  lurasidone (LATUDA) 20 MG TABS tablet Take 40 mg by mouth.    Yes [provider]  magnesium oxide (MAG-OX) 400 MG tablet Take 400 mg by mouth 2 (two) times daily.     Yes [provider]  Melatonin 10 MG TABS Take 10 mg by mouth at bedtime.   Yes [provider]  metoprolol succinate (TOPROL-XL) 50 MG 24 hr tablet Take 25 mg by mouth every evening.    Yes [provider]  Multiple Vitamin (MULTIVITAMIN WITH MINERALS) TABS Take 1 tablet by mouth daily.   Yes [provider]  omeprazole (PRILOSEC) 20 MG capsule Take 20 mg by mouth daily.   Yes [provider]  ondansetron (ZOFRAN) 4 MG tablet Take 4 mg by mouth every 8 (eight) hours as needed for nausea.   Yes [provider]  pantoprazole (PROTONIX) 40 MG tablet Take 40 mg by mouth daily.   Yes [provider]  pravastatin (PRAVACHOL) 20 MG tablet Take 20 mg by mouth every evening.   Yes [provider]  promethazine (PHENERGAN) 25 MG tablet Take 1 tablet (25 mg total) by mouth every 6 (six) hours as needed for nausea or vomiting. 06/24/14  Yes Szekalski, Kaitlyn, PA-C  QUEtiapine Fumarate (SEROQUEL XR) 150 MG 24 hr tablet Take 150 mg by mouth at bedtime.   Yes [provider]  SUMAtriptan (IMITREX) 100 MG tablet Take 100 mg by mouth every 2 (two) hours as needed for migraine.   Yes [provider]  topiramate (TOPAMAX) 50 MG tablet Take 50 mg by mouth 2 (two) times daily.    Yes [provider]  venlafaxine XR (EFFEXOR-XR) 150 MG 24 hr capsule Take 150 mg by mouth daily with breakfast.   Yes [provider]  Vitamin D, Ergocalciferol, (DRISDOL) 50000 UNITS CAPS Take 50,000 Units by mouth every 7 (seven) days. On Sunday   Yes [provider]  oxyCODONE-acetaminophen (PERCOCET/ROXICET) 5-325 MG tablet Take 1 tablet by mouth every 6 (six) hours as needed for severe pain. Patient not taking: Reported on 07/26/2015 07/17/15   Macarthur Critchley, MD    Family History Family History  Problem Relation Age of Onset  . Cancer Father   . Hyperlipidemia Father   . Osteoarthritis Mother   .  Hyperlipidemia Mother     Social History Social History  Substance Use Topics  . Smoking status: Never Smoker  . Smokeless tobacco: Never Used  . Alcohol use No     Allergies   Dhea [nutritional supplements]; Entex lq [phenylephrine-guaifenesin]; Indomethacin; Iodinated diagnostic agents; Propoxyphene; Risperidone and related; Shellfish allergy; Depakote [divalproex sodium]; Abilify [aripiprazole]; Dihydroergotamine; Doxycycline; Erythromycin; Lactose intolerance (gi); Nsaids; and Tape   Review of Systems Review of Systems  All other systems reviewed and are negative.   Physical Exam Updated Vital Signs BP 108/81   Pulse (!) 114   Temp (!) 101.7 F (38.7 C) (Oral)   Resp (!) 21   SpO2 100%   Physical Exam  Constitutional: She is oriented to person, place, and time. She appears well-developed and well-nourished.  HENT:  Head: Normocephalic and  atraumatic.  Eyes: Conjunctivae are normal. Pupils are equal, round, and reactive to light. Right eye exhibits no discharge. Left eye exhibits no discharge. No scleral icterus.  Neck: Normal range of motion. No JVD present. No tracheal deviation present.  Cardiovascular: Regular rhythm.   Pulmonary/Chest: Effort normal. No stridor. No respiratory distress. She has no wheezes. She has no rales. She exhibits no tenderness.  Abdominal:  Exquisite tenderness to palpation of the suprapubic region, moderate tenderness to bilateral lower abdomen- bilateral CVA tenderness  Neurological: She is alert and oriented to person, place, and time. Coordination normal.  Skin: Skin is warm.  Psychiatric: She has a normal mood and affect. Her behavior is normal. Judgment and thought content normal.  Nursing note and vitals reviewed.    ED Treatments / Results  Labs (all labs ordered are listed, but only abnormal results are displayed) Labs Reviewed  COMPREHENSIVE METABOLIC PANEL - Abnormal; Notable for the following:       Result Value   Sodium  134 (*)    CO2 21 (*)    Glucose, Bld 132 (*)    Creatinine, Ser 1.90 (*)    Total Protein 6.4 (*)    Albumin 3.4 (*)    GFR calc non Af Amer 32 (*)    GFR calc Af Amer 38 (*)    All other components within normal limits  CBC - Abnormal; Notable for the following:    WBC 12.4 (*)    All other components within normal limits  URINALYSIS, ROUTINE W REFLEX MICROSCOPIC - Abnormal; Notable for the following:    APPearance CLOUDY (*)    Hgb urine dipstick MODERATE (*)    Protein, ur 30 (*)    Nitrite POSITIVE (*)    Leukocytes, UA LARGE (*)    Bacteria, UA MANY (*)    Squamous Epithelial / LPF TOO NUMEROUS TO COUNT (*)    Non Squamous Epithelial 0-5 (*)    All other components within normal limits  I-STAT CG4 LACTIC ACID, ED - Abnormal; Notable for the following:    Lactic Acid, Venous 2.69 (*)    All other components within normal limits  I-STAT CG4 LACTIC ACID, ED - Abnormal; Notable for the following:    Lactic Acid, Venous 2.77 (*)    All other components within normal limits  CULTURE, BLOOD (ROUTINE X 2)  CULTURE, BLOOD (ROUTINE X 2)  URINE CULTURE  LIPASE, BLOOD  I-STAT BETA HCG BLOOD, ED ([facility], [facility], [facility] ONLY)  I-STAT CG4 LACTIC ACID, ED  I-STAT CG4 LACTIC ACID, ED    EKG  EKG Interpretation  Date/Time:  Wednesday Sep 18 2016 12:04:44 EDT Ventricular Rate:  132 PR Interval:    QRS Duration: 58 QT Interval:  324 QTC Calculation: 481 R Axis:   43 Text Interpretation:  Sinus tachycardia Low voltage, precordial leads Borderline T abnormalities, diffuse leads Abnormal ekg Confirmed by Carmin Muskrat [phone]) on 09/18/2016 12:26:08 PM Also confirmed by Carmin Muskrat ([id number]), editor Drema Pry [phone])  on 09/18/2016 1:08:32 PM       Radiology No results found.  Procedures Procedures (including critical care time)  CRITICAL CARE Performed by: Elmer Ramp   Total critical care time: 35  minutes  Critical care time was exclusive of separately  billable procedures and treating other patients.  Critical care was necessary to treat or prevent imminent or life-threatening deterioration.  Critical care was time spent personally by me on the following activities: development of treatment plan with patient and/or  surrogate as well as nursing, discussions with consultants, evaluation of patient's response to treatment, examination of patient, obtaining history from patient or surrogate, ordering and performing treatments and interventions, ordering and review of laboratory studies, ordering and review of radiographic studies, pulse oximetry and re-evaluation of patient's condition.   Medications Ordered in ED Medications  cefTRIAXone (ROCEPHIN) 1 g in dextrose 5 % 50 mL IVPB (not administered)  cefTRIAXone (ROCEPHIN) 2 g in dextrose 5 % 50 mL IVPB (0 g Intravenous Stopped 09/18/16 1304)  sodium chloride 0.9 % bolus 1,000 mL (0 mLs Intravenous Stopped 09/18/16 1344)    And  sodium chloride 0.9 % bolus 1,000 mL (0 mLs Intravenous Stopped 09/18/16 1344)    And  sodium chloride 0.9 % bolus 250 mL (0 mLs Intravenous Stopped 09/18/16 1344)  acetaminophen (TYLENOL) suppository 650 mg (650 mg Rectal Given 09/18/16 1328)     Initial Impression / Assessment and Plan / ED Course  I have reviewed the triage vital signs and the nursing notes.  Pertinent labs & imaging results that were available during my care of the patient were reviewed by me and considered in my medical decision making (see chart for details).      Final Clinical Impressions(s) / ED Diagnoses   Final diagnoses:  Sepsis, due to unspecified organism [facility])  Urinary tract infection with hematuria, site unspecified    Labs: A stat lactic acid, urinalysis, blood cultures, i-STAT beta hCG, lipase, CMP, CBC  Imaging: EKG  Consults:  Therapeutics: Ceftriaxone, Tylenol, normal saline  Discharge Meds:   Assessment/Plan:   41 year old female presents today with sepsis. This is  likely secondary to her having little by mouth holding her urine. She has urinary symptoms, urinalysis showing urinary tract infection. Patient started on ceftriaxone, weight-based fluid resuscitation. Hospitalist service will be contacted for admission and ongoing management. Patient does not display any signs of severe sepsis at this time her vital signs have remained stable while here in the ED.  Tried consult and he agreed to hospital admission.     New Prescriptions New Prescriptions   No medications on file     Francee Gentile 09/18/16 1530    Carmin Muskrat, MD 09/19/16 2208

## 2016-09-18 NOTE — ED Notes (Signed)
Hooked patient up to the monitor patient is resting with caretaker at beside

## 2016-09-18 NOTE — Progress Notes (Signed)
[facility] Progress Note Patient Name: Brandi Love DOB: 01-16-1976 MRN: [id number]   Date of Service  09/18/2016  HPI/Events of Note  Evaluated patient's portable chest x-ray post right internal jugular central venous catheter placement. Somewhat lordotic view. No obvious pneumothorax or deep sulcus. The tip appears to be in appropriate position in the lower SVC at the right atrial junction.   eICU Interventions  Standing orders for catheter use and maintenance placed.      Intervention Category Intermediate Interventions: Diagnostic test evaluation  Tera Partridge 09/18/2016, 9:40 PM

## 2016-09-18 NOTE — ED Notes (Signed)
Jacquelynn Cree, RN accepts report at this time

## 2016-09-18 NOTE — ED Notes (Signed)
ED Provider at bedside. 

## 2016-09-18 NOTE — Progress Notes (Signed)
Pharmacy Antibiotic Note  Brandi Love is a 41 y.o. female admitted on 09/18/2016 with UTI.  Pharmacy has been consulted for ceftriaxone dosing.  Ceftriaxone 2g IV x 1 ordered in the ED.  Plan: Continue ceftriaxone 1g IV q24h Monitor c/s, LOT Not renally adjusted - Rx will s/o consult     Temp (24hrs), Avg:101.7 F (38.7 C), Min:101.7 F (38.7 C), Max:101.7 F (38.7 C)   Recent Labs Lab 09/18/16 1104 09/18/16 1129  WBC 12.4*  --   CREATININE 1.90*  --   LATICACIDVEN  --  2.69*    CrCl cannot be calculated (Unknown ideal weight.).    Allergies  Allergen Reactions  . Entex Lq [Phenylephrine-Guaifenesin] Shortness Of Breath and Swelling    Throat swelling  . Iodinated Diagnostic Agents Anaphylaxis    'cant breathe'  . Propoxyphene Anaphylaxis  . Depakote [Divalproex Sodium] Nausea And Vomiting and Other (See Comments)    Weight gain  . Dhea [Nutritional Supplements] Nausea And Vomiting  . Indomethacin Nausea And Vomiting  . Risperidone And Related Other (See Comments)    Out of body experience  . Shellfish Allergy   . Abilify [Aripiprazole] Nausea And Vomiting  . Dihydroergotamine Nausea And Vomiting  . Doxycycline Nausea And Vomiting  . Erythromycin Nausea And Vomiting  . Lactose Intolerance (Gi) Nausea And Vomiting  . Nsaids Other (See Comments)    Unable to ttake due to renal insufficency  . Tape Other (See Comments)    Redness, can use paper tape    Elicia Lamp, PharmD, BCPS Clinical Pharmacist 09/18/2016 11:57 AM

## 2016-09-18 NOTE — ED Notes (Signed)
Lab at bedside to draw blood.

## 2016-09-18 NOTE — ED Triage Notes (Signed)
Pt here with family for worsening anorexia; pt has not eaten food in two weeks drinks small amount of water and ensure; pt appears lethargic

## 2016-09-18 NOTE — Progress Notes (Signed)
CRITICAL VALUE ALERT  Critical Value:  Lactic Acid 2.6  Date & Time Notied:  09/18/16  Provider Notified: Hal Hope  Orders Received/Actions taken: 54ml bolus

## 2016-09-18 NOTE — Procedures (Signed)
Central Venous Catheter Insertion Procedure Note Ruble Buttler Moustafa [id number] [birth year]  Procedure: Insertion of Central Venous Catheter Indications: Assessment of intravascular volume, Drug and/or fluid administration and Frequent blood sampling  Procedure Details Consent: Risks of procedure as well as the alternatives and risks of each were explained to the (patient/caregiver).  Consent for procedure obtained. Time Out: Verified patient identification, verified procedure, site/side was marked, verified correct patient position, special equipment/implants available, medications/allergies/relevent history reviewed, required imaging and test results available.  Performed  Maximum sterile technique was used including antiseptics, cap, gloves, gown, hand hygiene, mask and sheet. Skin prep: Chlorhexidine; local anesthetic administered A antimicrobial bonded/coated triple lumen catheter was placed in the right internal jugular vein using the Seldinger technique.  Evaluation Blood flow good Complications: No apparent complications Patient did tolerate procedure well. Chest X-ray ordered to verify placement.  CXR: pending.  Procedure performed under direct ultrasound guidance for real time vessel cannulation.      Extremely difficult procedure as pt VERY claustrophobic and hard of hearing and only able to read lips.  If further lines needed, consider femoral access vs internal jugular or subclavian.   Montey Hora, [facility] Pager: [phone]  or [phone] 09/18/2016, 9:17 PM

## 2016-09-19 DIAGNOSIS — A419 Sepsis, unspecified organism: Principal | ICD-10-CM

## 2016-09-19 LAB — CBC
HEMATOCRIT: 31.7 % — AB (ref 36.0–46.0)
Hemoglobin: 10.4 g/dL — ABNORMAL LOW (ref 12.0–15.0)
MCH: 30.5 pg (ref 26.0–34.0)
MCHC: 32.8 g/dL (ref 30.0–36.0)
MCV: 93 fL (ref 78.0–100.0)
Platelets: 189 10*3/uL (ref 150–400)
RBC: 3.41 MIL/uL — ABNORMAL LOW (ref 3.87–5.11)
RDW: 14 % (ref 11.5–15.5)
WBC: 9 10*3/uL (ref 4.0–10.5)

## 2016-09-19 LAB — URINE CULTURE

## 2016-09-19 LAB — BASIC METABOLIC PANEL
Anion gap: 7 (ref 5–15)
BUN: 14 mg/dL (ref 6–20)
CALCIUM: 8.1 mg/dL — AB (ref 8.9–10.3)
CO2: 21 mmol/L — AB (ref 22–32)
CREATININE: 1.55 mg/dL — AB (ref 0.44–1.00)
Chloride: 108 mmol/L (ref 101–111)
GFR calc Af Amer: 48 mL/min — ABNORMAL LOW (ref >60)
GFR calc non Af Amer: 42 mL/min — ABNORMAL LOW (ref >60)
Glucose, Bld: 139 mg/dL — ABNORMAL HIGH (ref 65–99)
Potassium: 3.9 mmol/L (ref 3.5–5.1)
Sodium: 136 mmol/L (ref 135–145)

## 2016-09-19 LAB — GLUCOSE, CAPILLARY
GLUCOSE-CAPILLARY: 93 mg/dL (ref 65–99)
GLUCOSE-CAPILLARY: 142 mg/dL — AB (ref 65–99)
Glucose-Capillary: 134 mg/dL — ABNORMAL HIGH (ref 65–99)

## 2016-09-19 LAB — TROPONIN I

## 2016-09-19 LAB — LACTIC ACID, PLASMA: LACTIC ACID, VENOUS: 2.3 mmol/L — AB (ref 0.5–1.9)

## 2016-09-19 LAB — D-DIMER, QUANTITATIVE: D-Dimer, Quant: 0.45 ug/mL-FEU (ref 0.00–0.50)

## 2016-09-19 LAB — HIV ANTIBODY (ROUTINE TESTING W REFLEX): HIV SCREEN 4TH GENERATION: NONREACTIVE

## 2016-09-19 MED ORDER — PIPERACILLIN-TAZOBACTAM 3.375 G IVPB
3.3750 g | Freq: Three times a day (TID) | INTRAVENOUS | Status: DC
  Administered 2016-09-19 – 2016-09-20 (×5): 3.375 g via INTRAVENOUS
  Filled 2016-09-19 (×8): qty 50

## 2016-09-19 MED ORDER — VANCOMYCIN HCL 10 G IV SOLR
1250.0000 mg | INTRAVENOUS | Status: DC
  Administered 2016-09-19 – 2016-09-20 (×2): 1250 mg via INTRAVENOUS
  Filled 2016-09-19 (×3): qty 1250

## 2016-09-19 NOTE — Progress Notes (Signed)
Patient ID: Brandi Love, female   DOB: 1975/05/24, 41 y.o.   MRN: [id number]                                                                PROGRESS NOTE                                                                                                                                                                                                             Patient Demographics:    Brandi Love, is a 41 y.o. female, DOB - 07/17/1975, HYQ:[id number]  Admit date - 09/18/2016   Admitting Physician Waldemar Dickens, MD  Outpatient Primary MD for the patient is Vickii Penna, MD  LOS - 1  Outpatient Specialists:    Chief Complaint  Patient presents with  . Eating Disorder       Brief Narrative  41 y.o. female with medical history significant of anxiety/depression, deafness, C KD, diabetes, eating disorders, endometriosis, GERD, hyperlipidemia, hypertension, hypothyroidism, migraines, DVT/pulmonary embolus, seizures. Patient attended to at time of presentation by her in-home health care aide. Limited history provided by patient due to acuity of illness, difficulty hearing and patient not wanting to participate in exam. Patient with very little oral intake over the last 2 days. Intermittent dry cough over the last days. Suprapubic abdominal pain consistent with the patient describes as her UTI symptoms. Urine is become very dark, odorous and quality. Denies any fevers, chest pain, palpitations, diarrhea, focal neurological deficit, headache. Does endorse bilateral flank pain.  No further history able to be obtained.    Subjective:    Brandi Love today has ? Slight suprapubic discomfort.  Pt has difficulty with communication.  No headache, No chest pain, No abdominal pain - No Nausea, No new weakness tingling or numbness, No Cough - SOB.   Assessment  & Plan :    Active Problems:   Borderline personality disorder   CKD (chronic kidney disease) stage 3, GFR 30-59 ml/min   Hypothyroidism   MDD  (major depressive disorder), recurrent, severe, with psychosis ([place])   Sepsis ([place])   Acute pyelonephritis   Sepsis: likely from Pyelo  (lactic acid 2.77, wbc 12.4)  Cont ns iv Awaiting urine and blood culture Vanco iv pharmacy to dose Zosyn iv pharmacy to dose  Hypotension improving with hydration Cont ns iv  Tachycardia likely from sepsis Check trop  Check d dimer If d dimer is positive then order VQ r/o PE (since hx of PE) Consider cardiac echo if tachycardia persistent  Depression/Eating disorder/HA: at baseline Continue Seroquel, Latuda, Haldol, Effexor, Topamax, Imitrex  CKD: Cr 1.9. At basleine cmp in am  Seizures: continue Lamictal  Hypothyroidism,: continue Synthroid  Hypertension: Hold metoprolol due to hypotension  Chronic pain: continue Flexeril  Hyperlipidemia: continue Lipitor  GERD: continue protonix  Insomnia: continue melatonoin  Prediabetes: Diet controlled. CBG before meals at bedtime  DVT prophylaxis: hep  Code Status: full  Family Communication: none  Disposition Plan: pending improvement  Consults called: none  Admission status: inpt   Lab Results  Component Value Date   PLT 189 09/19/2016    Antibiotics  :   Rocephin 5/30=>5/31 Vanco 5/31=> Zosyn 5/31=>  Anti-infectives    Start     Dose/Rate Route Frequency Ordered Stop   09/19/16 1200  cefTRIAXone (ROCEPHIN) 1 g in dextrose 5 % 50 mL IVPB  Status:  Discontinued     1 g 100 mL/hr over 30 Minutes Intravenous Every 24 hours 09/18/16 1156 09/19/16 0450   09/19/16 0600  vancomycin (VANCOCIN) 1,250 mg in sodium chloride 0.9 % 250 mL IVPB     1,250 mg 166.7 mL/hr over 90 Minutes Intravenous Every 24 hours 09/19/16 0456     09/19/16 0600  piperacillin-tazobactam (ZOSYN) IVPB 3.375 g     3.375 g 12.5 mL/hr over 240 Minutes Intravenous Every 8 hours 09/19/16 0456     09/18/16 1200  cefTRIAXone (ROCEPHIN) 2 g in dextrose 5 % 50 mL IVPB     2 g 100 mL/hr over 30  Minutes Intravenous  Once 09/18/16 1151 09/18/16 1304        Objective:   Vitals:   09/18/16 2300 09/18/16 2302 09/19/16 0229 09/19/16 0400  BP:  111/81  109/77  Pulse: (!) 127 (!) 128  (!) 118  Resp: (!) 29 (!) 28  (!) 24  Temp:  (!) 101.5 F (38.6 C) (!) 102.7 F (39.3 C) (!) 101.1 F (38.4 C)  TempSrc:  Oral Oral Oral  SpO2: 99% 100%  98%  Weight:  74.9 kg (165 lb 2 oz)    Height:  5\' 3"  (1.6 m)      Wt Readings from Last 3 Encounters:  09/18/16 74.9 kg (165 lb 2 oz)  09/04/16 74.9 kg (165 lb 3.2 oz)  08/28/16 76.2 kg (167 lb 14.4 oz)     Intake/Output Summary (Last 24 hours) at 09/19/16 0540 Last data filed at 09/18/16 2218  Gross per 24 hour  Intake              480 ml  Output              750 ml  Net             -270 ml     Physical Exam  Awake Alert, Oriented X 1 (responds to name), No new F.N deficits, Normal affect [place].AT,PERRAL Supple Neck,No JVD, No cervical lymphadenopathy appriciated.  Symmetrical Chest wall movement, Good air movement bilaterally, CTAB Tachycardic s1, s2, no m/g/r +ve B.Sounds, Abd Soft, No tenderness, No organomegaly appriciated, No rebound - guarding or rigidity., slight suprapubic tenderness No Cyanosis, Clubbing or edema, No new Rash or bruise     Data Review:    CBC  Recent Labs Lab 09/18/16 1104 09/19/16 0340  WBC 12.4*  9.0  HGB 13.2 10.4*  HCT 38.8 31.7*  PLT 245 189  MCV 91.3 93.0  MCH 31.1 30.5  MCHC 34.0 32.8  RDW 13.7 14.0    Chemistries   Recent Labs Lab 09/18/16 1104 09/18/16 2225 09/19/16 0340  NA 134* 139 136  K 4.5 4.0 3.9  CL 101 108 108  CO2 21* 21* 21*  GLUCOSE 132* 126* 139*  BUN 17 14 14   CREATININE 1.90* 1.66* 1.55*  CALCIUM 9.4 8.5* 8.1*  AST 15 18  --   ALT 19 17  --   ALKPHOS 69 66  --   BILITOT 0.9 0.5  --    ------------------------------------------------------------------------------------------------------------------ No results for input(s): CHOL, HDL, LDLCALC, TRIG,  CHOLHDL, LDLDIRECT in the last 72 hours.  Lab Results  Component Value Date   HGBA1C 5.6 12/14/2012   ------------------------------------------------------------------------------------------------------------------ No results for input(s): TSH, T4TOTAL, T3FREE, THYROIDAB in the last 72 hours.  Invalid input(s): FREET3 ------------------------------------------------------------------------------------------------------------------ No results for input(s): VITAMINB12, FOLATE, FERRITIN, TIBC, IRON, RETICCTPCT in the last 72 hours.  Coagulation profile  Recent Labs Lab 09/18/16 2225  INR 1.13    No results for input(s): DDIMER in the last 72 hours.  Cardiac Enzymes No results for input(s): CKMB, TROPONINI, MYOGLOBIN in the last 168 hours.  Invalid input(s): CK ------------------------------------------------------------------------------------------------------------------ No results found for: BNP  Inpatient Medications  Scheduled Meds: . aspirin EC  81 mg Oral QHS  . atorvastatin  20 mg Oral q1800  . Chlorhexidine Gluconate Cloth  6 each Topical Daily  . feeding supplement (ENSURE ENLIVE)  237 mL Oral BID BM  . haloperidol  5 mg Oral QHS  . heparin  5,000 Units Subcutaneous Q8H  . lamoTRIgine  200 mg Oral Daily  . levothyroxine  150 mcg Oral QAC breakfast  . lurasidone  40 mg Oral q morning - 10a  . Melatonin  9 mg Oral QHS  . pantoprazole  40 mg Oral Daily  . QUEtiapine  150 mg Oral QHS  . sodium chloride flush  10-40 mL Intracatheter Q12H  . topiramate  50 mg Oral BID  . venlafaxine XR  150 mg Oral Q breakfast   Continuous Infusions: . sodium chloride 100 mL/hr at 09/18/16 1958  . piperacillin-tazobactam (ZOSYN)  IV    . vancomycin     PRN Meds:.acetaminophen **OR** acetaminophen, cyclobenzaprine, hydrOXYzine, promethazine, sodium chloride flush, SUMAtriptan  Micro Results No results found for this or any previous visit (from the past 240  hour(s)).  Radiology Reports Dg Chest Port 1 View  Result Date: 09/18/2016 CLINICAL DATA:  Encounter for central line placement EXAM: PORTABLE CHEST 1 VIEW COMPARISON:  06/23/2014 CXR FINDINGS: The heart size and mediastinal contours are within normal limits. New right IJ central line catheter tip is seen at the cavoatrial junction. No pneumothorax. Both lungs are clear. The visualized skeletal structures are unremarkable. IMPRESSION: No pneumothorax status post right IJ central line catheter placement. Tip is at the cavoatrial junction. Electronically Signed   By: Ashley Royalty M.D.   On: 09/18/2016 21:42    Time Spent in minutes  30   Jani Gravel M.D on 09/19/2016 at 5:40 AM  Between 7am to 7pm - Pager - [phone]  After 7pm go to [email or web address] - password [facility]  [facility] -  Office  [phone]

## 2016-09-19 NOTE — Progress Notes (Signed)
Patient refused all am medications except Synthroid. She did drink her ensure but said that she wanted "no food in her room".

## 2016-09-19 NOTE — Progress Notes (Addendum)
CSW working on referrals for inpatient treatment centers:  [facility]: Phone: [phone]- left message for intake- only have 6 beds and pt dietician had already spoken with them and they had concerns about pt BMI being too high to be appropriate for their program- only able to accept for a week  [facility]: [phone]- expecting call back from intake- they do not accept Medicare or Medicaid but state that pt could be looked at for scholarship treatment but not no slots available at this time  [facility]- not in network with Medicare or Medicaid- not able to accept  [place] [phone]  [facility]- pt dietician speaking with them about admission but pt against this facility  Per pt dietician patient will need agreement from [facility] case agreement" for patient to go to treatment since very limited options for Medicaid/Medicare pts for treatment- pt therapist is working on this.  Orvan July is [facility] contact for this case [phone])- CSW left message  CSW will continue to follow  Jorge Ny, [place] Social Worker [phone]

## 2016-09-19 NOTE — Consult Note (Signed)
[facility] Consult   Reason for Consult:  Eating disorder Referring Physician:  Dr. Maudie Mercury Patient Identification: Brandi Love MRN:  [id number] Principal Diagnosis: Eating disorder, unspecified Diagnosis:   Patient Active Problem List   Diagnosis Date Noted  . Sepsis ([place]) [A41.9] 09/18/2016  . Acute pyelonephritis [N10] 09/18/2016  . MDD (major depressive disorder), recurrent, severe, with psychosis ([place]) [F33.3] 12/11/2014  . Eating disorder, unspecified [F50.9] 11/23/2012  . Hypothyroidism [E03.9] 11/23/2012  . Dyslipidemia [E78.5] 11/23/2012  . Sinus tachycardia [R00.0] 11/23/2012  . CKD (chronic kidney disease) stage 3, GFR 30-59 ml/min [N18.3] 11/18/2012  . Protein-calorie malnutrition, severe ([place]) [E43] 11/13/2012  . Abdominal pain, epigastric [R10.13] 11/12/2012  . Dehydration [E86.0] 11/12/2012  . Acute on chronic renal failure ([place]) [N17.9, N18.9] 11/12/2012  . Leukocytosis, unspecified [D72.829] 11/12/2012  . Hyponatremia [E87.1] 11/12/2012  . Borderline personality disorder [F60.3] 11/12/2012    Total Time spent with patient: 45 minutes  Subjective:   Brandi Love is a 41 y.o. female patient admitted with decreased oral intake and abdominal pain.  HPI:  Brandi Love is a 41 y.o. female with medical history significant of anxiety/depression, deafness, C KD, diabetes, eating disorders, endometriosis, GERD, hyperlipidemia, hypertension, hypothyroidism, migraines, DVT/pulmonary embolus, seizures.   Patient seen, chart reviewed and case discussed with the staff RN and CSW for this face-to-face psychiatric consultation and evaluation of eating disorder. Patient appeared lying on her bed and sleeping when entering her room. Patient woke up with the tactile stimuli and then could not talk to me because of hearing impairment. Patient started getting upset and started shouting because she cannot hear and required the placement of battery for her hearing aid.  Patient is calm and cooperative but still having some difficulty to hearing after using hearing aid. Patient reported she has been suffering with eating disorder since he was 41 years old and also required to be admitted to [facility] for about 3 weeks and then eating disorder counseling about 18 weeks. Patient reported she was admitted in 2007 and later 2014 she was relocated to herself to [facility] and has been following up with the eating disorder dietitian, eating disorder counselor and also has a ACT team following up with her for case management. Patient primary team has been concerned about deterioration of her physical condition secondary to decreased oral intake to only a few bites a day, last significant weight from 192 currently probably less than 166 since January of this year. Patient stated she has okay appetite but could not eat because of stomach pain. Patient continued to report I can't eat, not eating, my stomach hurts and also nausea. Patient has low blood pressure. Patient has a history of migraines and history of suicidal in January 2015 and required to go to [facility] in [place], [place]. Today patient stated she has no symptoms of depression or suicidal ideation but continued to endorse auditory hallucinations but controlled with her current medication. Patient is also requesting to be placed in eating disorder program at [facility] as her primary team members has been trying to reach them and waiting for availability of the bed.  Past Psychiatric History:  Borderline personality disorder, anxiety/depression and history of acute psychiatric hospitalization at [facility] in general 2018.  Risk to Self: Is patient at risk for suicide?: No Risk to Others:   Prior Inpatient Therapy:   Prior Outpatient Therapy:    Past Medical History:  Past Medical History:  Diagnosis  Date  . Anemia   . Anxiety   . Arthritis    "hands" (09/18/2016)  . Chronic lower  back pain   . Chronic renal insufficiency   . Deafness   . Depression   . Diabetes ([place])    "borderline"  . Eating disorder   . Endometriosis   . GERD (gastroesophageal reflux disease)   . History of kidney stones   . Hyperlipidemia   . Hypertension   . 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 Migraine    "frequency varies" (09/18/2016)  . OCD (obsessive compulsive disorder)   . Pneumonia 2011  . PONV (postoperative nausea and vomiting)   . Pulmonary embolism ([place]) 10/2001  . Seizures ([place]) 09/2001 X 1   "day after my hysterectomy"  . Tachycardia     Past Surgical History:  Procedure Laterality Date  . ABDOMINAL HYSTERECTOMY  09/2001   'except for right ovary'  . BREAST LUMPECTOMY Left 2000   benign  . BREAST LUMPECTOMY Right 2002   benign  . COCHLEAR IMPLANT Bilateral 2004-2010   "right-left"  . EYE MUSCLE SURGERY Left 1980   "lazy eye"  . EYE MUSCLE SURGERY Right 1990   "lazy eye"  . LAPAROSCOPIC ABDOMINAL EXPLORATION  2001 and 2002   for endometriosis   Family History:  Family History  Problem Relation Age of Onset  . Cancer Father   . Hyperlipidemia Father   . Osteoarthritis Mother   . Hyperlipidemia Mother    Family Psychiatric  History: Unknown Social History:  History  Alcohol Use No     History  Drug Use No    Social History   Social History  . Marital status: Single    Spouse name: N/A  . Number of children: 0  . Years of education: 14   Occupational History  . Disabled.    Social History Main Topics  . Smoking status: Never Smoker  . Smokeless tobacco: Never Used  . Alcohol use No  . Drug use: No  . Sexual activity: No   Other Topics Concern  . None   Social History Narrative   Lives alone.  Single.  No family in town.     Additional Social History:    Allergies:   Allergies  Allergen Reactions  . Dhea [Nutritional Supplements] Nausea And Vomiting  . Entex Lq [Phenylephrine-Guaifenesin] Shortness Of Breath and  Swelling    Throat swelling  . Indomethacin Nausea And Vomiting  . Iodinated Diagnostic Agents Anaphylaxis    'cant breathe'  . Propoxyphene Anaphylaxis  . Risperidone And Related Other (See Comments)    Out of body experience  . Shellfish Allergy Other (See Comments)    unspecified  . Depakote [Divalproex Sodium] Nausea And Vomiting and Other (See Comments)    Weight gain  . Abilify [Aripiprazole] Nausea And Vomiting  . Dihydroergotamine Nausea And Vomiting  . Doxycycline Nausea And Vomiting  . Erythromycin Nausea And Vomiting  . Lactose Intolerance (Gi) Nausea And Vomiting  . Nsaids Other (See Comments)    Unable to ttake due to renal insufficency  . Tape Other (See Comments)    Redness, can use paper tape    Labs:  Results for orders placed or performed during the hospital encounter of 09/18/16 (from the past 48 hour(s))  Lipase, blood     Status: None   Collection Time: 09/18/16 11:04 AM  Result Value Ref Range   Lipase 37 11 - 51 U/L  Comprehensive metabolic  panel     Status: Abnormal   Collection Time: 09/18/16 11:04 AM  Result Value Ref Range   Sodium 134 (L) 135 - 145 mmol/L   Potassium 4.5 3.5 - 5.1 mmol/L   Chloride 101 101 - 111 mmol/L   CO2 21 (L) 22 - 32 mmol/L   Glucose, Bld 132 (H) 65 - 99 mg/dL   BUN 17 6 - 20 mg/dL   Creatinine, Ser 1.90 (H) 0.44 - 1.00 mg/dL   Calcium 9.4 8.9 - 10.3 mg/dL   Total Protein 6.4 (L) 6.5 - 8.1 g/dL   Albumin 3.4 (L) 3.5 - 5.0 g/dL   AST 15 15 - 41 U/L   ALT 19 14 - 54 U/L   Alkaline Phosphatase 69 38 - 126 U/L   Total Bilirubin 0.9 0.3 - 1.2 mg/dL   GFR calc non Af Amer 32 (L) >60 mL/min   GFR calc Af Amer 38 (L) >60 mL/min    Comment: (NOTE) The eGFR has been calculated using the CKD EPI equation. This calculation has not been validated in all clinical situations. eGFR's persistently <60 mL/min signify possible Chronic Kidney Disease.    Anion gap 12 5 - 15  CBC     Status: Abnormal   Collection Time: 09/18/16  11:04 AM  Result Value Ref Range   WBC 12.4 (H) 4.0 - 10.5 K/uL   RBC 4.25 3.87 - 5.11 MIL/uL   Hemoglobin 13.2 12.0 - 15.0 g/dL   HCT 38.8 36.0 - 46.0 %   MCV 91.3 78.0 - 100.0 fL   MCH 31.1 26.0 - 34.0 pg   MCHC 34.0 30.0 - 36.0 g/dL   RDW 13.7 11.5 - 15.5 %   Platelets 245 150 - 400 K/uL  I-Stat beta hCG blood, ED     Status: None   Collection Time: 09/18/16 11:27 AM  Result Value Ref Range   I-stat hCG, quantitative <5.0 <5 mIU/mL   Comment 3            Comment:   GEST. AGE      CONC.  (mIU/mL)   <=1 WEEK        5 - 50     2 WEEKS       50 - 500     3 WEEKS       100 - 10,000     4 WEEKS     1,000 - 30,000        FEMALE AND NON-PREGNANT FEMALE:     LESS THAN 5 mIU/mL   I-Stat CG4 Lactic Acid, ED     Status: Abnormal   Collection Time: 09/18/16 11:29 AM  Result Value Ref Range   Lactic Acid, Venous 2.69 (HH) 0.5 - 1.9 mmol/L   Comment NOTIFIED PHYSICIAN   Urinalysis, Routine w reflex microscopic     Status: Abnormal   Collection Time: 09/18/16  1:54 PM  Result Value Ref Range   Color, Urine YELLOW YELLOW   APPearance CLOUDY (A) CLEAR   Specific Gravity, Urine 1.011 1.005 - 1.030   pH 5.0 5.0 - 8.0   Glucose, UA NEGATIVE NEGATIVE mg/dL   Hgb urine dipstick MODERATE (A) NEGATIVE   Bilirubin Urine NEGATIVE NEGATIVE   Ketones, ur NEGATIVE NEGATIVE mg/dL   Protein, ur 30 (A) NEGATIVE mg/dL   Nitrite POSITIVE (A) NEGATIVE   Leukocytes, UA LARGE (A) NEGATIVE   RBC / HPF 6-30 0 - 5 RBC/hpf   WBC, UA TOO NUMEROUS TO COUNT 0 -  5 WBC/hpf   Bacteria, UA MANY (A) NONE SEEN   Squamous Epithelial / LPF TOO NUMEROUS TO COUNT (A) NONE SEEN   WBC Clumps PRESENT    Mucous PRESENT    Hyaline Casts, UA PRESENT    Non Squamous Epithelial 0-5 (A) NONE SEEN  Urine culture     Status: Abnormal   Collection Time: 09/18/16  1:54 PM  Result Value Ref Range   Specimen Description URINE, RANDOM    Special Requests NONE    Culture MULTIPLE SPECIES PRESENT, SUGGEST RECOLLECTION (A)     Report Status 09/19/2016 FINAL   I-Stat CG4 Lactic Acid, ED  (not at  [facility])     Status: Abnormal   Collection Time: 09/18/16  2:18 PM  Result Value Ref Range   Lactic Acid, Venous 2.77 (HH) 0.5 - 1.9 mmol/L   Comment NOTIFIED PHYSICIAN   Comprehensive metabolic panel     Status: Abnormal   Collection Time: 09/18/16 10:25 PM  Result Value Ref Range   Sodium 139 135 - 145 mmol/L   Potassium 4.0 3.5 - 5.1 mmol/L   Chloride 108 101 - 111 mmol/L   CO2 21 (L) 22 - 32 mmol/L   Glucose, Bld 126 (H) 65 - 99 mg/dL   BUN 14 6 - 20 mg/dL   Creatinine, Ser 1.66 (H) 0.44 - 1.00 mg/dL   Calcium 8.5 (L) 8.9 - 10.3 mg/dL   Total Protein 6.0 (L) 6.5 - 8.1 g/dL   Albumin 2.9 (L) 3.5 - 5.0 g/dL   AST 18 15 - 41 U/L   ALT 17 14 - 54 U/L   Alkaline Phosphatase 66 38 - 126 U/L   Total Bilirubin 0.5 0.3 - 1.2 mg/dL   GFR calc non Af Amer 38 (L) >60 mL/min   GFR calc Af Amer 44 (L) >60 mL/min    Comment: (NOTE) The eGFR has been calculated using the CKD EPI equation. This calculation has not been validated in all clinical situations. eGFR's persistently <60 mL/min signify possible Chronic Kidney Disease.    Anion gap 10 5 - 15  Lactic acid, plasma     Status: Abnormal   Collection Time: 09/18/16 10:25 PM  Result Value Ref Range   Lactic Acid, Venous 2.6 (HH) 0.5 - 1.9 mmol/L    Comment: CRITICAL RESULT CALLED TO, READ BACK BY AND VERIFIED WITH: BALDWIN,O RN 09/18/2016 2328 JORDANS   Procalcitonin     Status: None   Collection Time: 09/18/16 10:25 PM  Result Value Ref Range   Procalcitonin 0.31 ng/mL    Comment:        Interpretation: PCT (Procalcitonin) <= 0.5 ng/mL: Systemic infection (sepsis) is not likely. Local bacterial infection is possible. (NOTE)         ICU PCT Algorithm               Non ICU PCT Algorithm    ----------------------------     ------------------------------         PCT < 0.25 ng/mL                 PCT < 0.1 ng/mL     Stopping of antibiotics            Stopping of  antibiotics       strongly encouraged.               strongly encouraged.    ----------------------------     ------------------------------       PCT level decrease  by               PCT < 0.25 ng/mL       >= 80% from peak PCT       OR PCT 0.25 - 0.5 ng/mL          Stopping of antibiotics                                             encouraged.     Stopping of antibiotics           encouraged.    ----------------------------     ------------------------------       PCT level decrease by              PCT >= 0.25 ng/mL       < 80% from peak PCT        AND PCT >= 0.5 ng/mL            Continuin g antibiotics                                              encouraged.       Continuing antibiotics            encouraged.    ----------------------------     ------------------------------     PCT level increase compared          PCT > 0.5 ng/mL         with peak PCT AND          PCT >= 0.5 ng/mL             Escalation of antibiotics                                          strongly encouraged.      Escalation of antibiotics        strongly encouraged.   Protime-INR     Status: None   Collection Time: 09/18/16 10:25 PM  Result Value Ref Range   Prothrombin Time 14.5 11.4 - 15.2 seconds   INR 1.13   APTT     Status: Abnormal   Collection Time: 09/18/16 10:25 PM  Result Value Ref Range   aPTT 44 (H) 24 - 36 seconds    Comment:        IF BASELINE aPTT IS ELEVATED, SUGGEST PATIENT RISK ASSESSMENT BE USED TO DETERMINE APPROPRIATE ANTICOAGULANT THERAPY.   CBC     Status: Abnormal   Collection Time: 09/19/16  3:40 AM  Result Value Ref Range   WBC 9.0 4.0 - 10.5 K/uL   RBC 3.41 (L) 3.87 - 5.11 MIL/uL   Hemoglobin 10.4 (L) 12.0 - 15.0 g/dL    Comment: REPEATED TO VERIFY DELTA CHECK NOTED    HCT 31.7 (L) 36.0 - 46.0 %   MCV 93.0 78.0 - 100.0 fL   MCH 30.5 26.0 - 34.0 pg   MCHC 32.8 30.0 - 36.0 g/dL   RDW 14.0 11.5 - 15.5 %   Platelets 189 150 - 400 K/uL  Basic metabolic panel      Status: Abnormal   Collection Time:  09/19/16  3:40 AM  Result Value Ref Range   Sodium 136 135 - 145 mmol/L   Potassium 3.9 3.5 - 5.1 mmol/L   Chloride 108 101 - 111 mmol/L   CO2 21 (L) 22 - 32 mmol/L   Glucose, Bld 139 (H) 65 - 99 mg/dL   BUN 14 6 - 20 mg/dL   Creatinine, Ser 1.55 (H) 0.44 - 1.00 mg/dL   Calcium 8.1 (L) 8.9 - 10.3 mg/dL   GFR calc non Af Amer 42 (L) >60 mL/min   GFR calc Af Amer 48 (L) >60 mL/min    Comment: (NOTE) The eGFR has been calculated using the CKD EPI equation. This calculation has not been validated in all clinical situations. eGFR's persistently <60 mL/min signify possible Chronic Kidney Disease.    Anion gap 7 5 - 15  Lactic acid, plasma     Status: Abnormal   Collection Time: 09/19/16  3:40 AM  Result Value Ref Range   Lactic Acid, Venous 2.3 (HH) 0.5 - 1.9 mmol/L    Comment: CRITICAL RESULT CALLED TO, READ BACK BY AND VERIFIED WITH: Princess Bruins [phone] WILDERK   Glucose, capillary     Status: None   Collection Time: 09/19/16  7:43 AM  Result Value Ref Range   Glucose-Capillary 93 65 - 99 mg/dL  Troponin I     Status: None   Collection Time: 09/19/16  8:35 AM  Result Value Ref Range   Troponin I <0.03 <0.03 ng/mL  D-dimer, quantitative (not at [facility])     Status: None   Collection Time: 09/19/16  8:35 AM  Result Value Ref Range   D-Dimer, Quant 0.45 0.00 - 0.50 ug/mL-FEU    Comment: (NOTE) At the manufacturer cut-off of 0.50 ug/mL FEU, this assay has been documented to exclude PE with a sensitivity and negative predictive value of 97 to 99%.  At this time, this assay has not been approved by the FDA to exclude DVT/VTE. Results should be correlated with clinical presentation.   Glucose, capillary     Status: Abnormal   Collection Time: 09/19/16 12:00 PM  Result Value Ref Range   Glucose-Capillary 142 (H) 65 - 99 mg/dL    Current Facility-Administered Medications  Medication Dose Route Frequency Provider Last Rate Last Dose  . 0.9  %  sodium chloride infusion   Intravenous Continuous Waldemar Dickens, MD 100 mL/hr at 09/18/16 1958    . acetaminophen (TYLENOL) tablet 650 mg  650 mg Oral Q6H PRN Waldemar Dickens, MD       Or  . acetaminophen (TYLENOL) suppository 650 mg  650 mg Rectal Q6H PRN Waldemar Dickens, MD   650 mg at 09/19/16 0223  . aspirin EC tablet 81 mg  81 mg Oral QHS Waldemar Dickens, MD      . atorvastatin (LIPITOR) tablet 20 mg  20 mg Oral q1800 Waldemar Dickens, MD      . Chlorhexidine Gluconate Cloth 2 % PADS 6 each  6 each Topical Daily Javier Glazier, MD   6 each at 09/19/16 1000  . cyclobenzaprine (FLEXERIL) tablet 10 mg  10 mg Oral BID PRN Waldemar Dickens, MD      . feeding supplement (ENSURE ENLIVE) (ENSURE ENLIVE) liquid 237 mL  237 mL Oral BID BM Waldemar Dickens, MD   237 mL at 09/19/16 1033  . haloperidol (HALDOL) tablet 5 mg  5 mg Oral QHS Waldemar Dickens, MD      .  heparin injection 5,000 Units  5,000 Units Subcutaneous Q8H Waldemar Dickens, MD   5,000 Units at 09/19/16 [phone]  . hydrOXYzine (ATARAX/VISTARIL) tablet 50 mg  50 mg Oral TID PRN Waldemar Dickens, MD      . lamoTRIgine (LAMICTAL) tablet 200 mg  200 mg Oral Daily Waldemar Dickens, MD      . levothyroxine (SYNTHROID, LEVOTHROID) tablet 150 mcg  150 mcg Oral QAC breakfast Waldemar Dickens, MD   150 mcg at 09/19/16 0740  . lurasidone (LATUDA) tablet 40 mg  40 mg Oral q morning - 10a Waldemar Dickens, MD      . Melatonin TABS 9 mg  9 mg Oral QHS Waldemar Dickens, MD   9 mg at 09/18/16 2234  . pantoprazole (PROTONIX) EC tablet 40 mg  40 mg Oral Daily Waldemar Dickens, MD      . piperacillin-tazobactam (ZOSYN) IVPB 3.375 g  3.375 g Intravenous Q8H Waldemar Dickens, MD   Stopped at 09/19/16 1140  . promethazine (PHENERGAN) tablet 25 mg  25 mg Oral Q6H PRN Waldemar Dickens, MD      . QUEtiapine (SEROQUEL XR) 24 hr tablet 150 mg  150 mg Oral QHS Waldemar Dickens, MD      . sodium chloride flush (NS) 0.9 % injection 10-40 mL  10-40 mL  Intracatheter Q12H Javier Glazier, MD   10 mL at 09/19/16 1034  . sodium chloride flush (NS) 0.9 % injection 10-40 mL  10-40 mL Intracatheter PRN Javier Glazier, MD      . SUMAtriptan (IMITREX) tablet 100 mg  100 mg Oral Q2H PRN Waldemar Dickens, MD      . topiramate (TOPAMAX) tablet 50 mg  50 mg Oral BID Waldemar Dickens, MD      . vancomycin (VANCOCIN) 1,250 mg in sodium chloride 0.9 % 250 mL IVPB  1,250 mg Intravenous Q24H Waldemar Dickens, MD   Stopped at 09/19/16 [phone]  . venlafaxine XR (EFFEXOR-XR) 24 hr capsule 150 mg  150 mg Oral Q breakfast Waldemar Dickens, MD   150 mg at 09/19/16 9381    Musculoskeletal: Strength & Muscle Tone: decreased Gait & Station: unsteady Patient leans: N/A  Psychiatric Specialty Exam: Physical Exam as per history and physical   ROS generalized weakness, abdominal pain, unable to eat and have ongoing eating disorder along with the depression, anxiety and borderline personality.  No Fever-chills, No Headache, No changes with Vision or hearing, reports vertigo No problems swallowing food or Liquids, No Chest pain, Cough or Shortness of Breath, No Abdominal pain, No Nausea or Vommitting, Bowel movements are regular, No Blood in stool or Urine, No dysuria, No new skin rashes or bruises, No new joints pains-aches,  No new weakness, tingling, numbness in any extremity, No recent weight gain or loss, No polyuria, polydypsia or polyphagia,  A full 10 point Review of Systems was done, except as stated above, all other Review of Systems were negative.  Blood pressure 102/75, pulse (!) 118, temperature 100 F (37.8 C), temperature source Oral, resp. rate (!) 24, height '5\' 3"'  (1.6 m), weight 74.9 kg (165 lb 2 oz), SpO2 98 %.Body mass index is 29.25 kg/m.  General Appearance: Guarded  Eye Contact:  Good  Speech:  Clear and Coherent  Volume:  Decreased  Mood:  Anxious and Depressed  Affect:  Constricted and Depressed  Thought Process:  Coherent and  Goal Directed  Orientation:  Full (Time, Place, and  Person)  Thought Content:  Logical and Hallucinations: Auditory  Suicidal Thoughts:  No  Homicidal Thoughts:  No  Memory:  Immediate;   Good Recent;   Fair Remote;   Fair  Judgement:  Fair  Insight:  Fair  Psychomotor Activity:  Decreased  Concentration:  Concentration: Fair and Attention Span: Fair  Recall:  Good  Fund of Knowledge:  Good  Language:  Good  Akathisia:  Negative  Handed:  Right  AIMS (if indicated):     Assets:  Communication Skills Desire for Improvement Financial Resources/Insurance Housing Leisure Time Resilience Social Support Transportation  ADL's:  Impaired  Cognition:  WNL  Sleep:        Treatment Plan Summary: 41 years old female with multiple medical problems and borderline personality, anxiety disorder, depression disorderand eating disorder not otherwise specified presented with generalized weakness, stomach pain unable to participate in her treatment. Patient primary team is concerned about her safety without appropriate treatment program.   Patient will continue her current psychotropic medication from home Patient will meet criteria for eating disorder treatment program at [facility] or other places Case discussed with the staff RN and CSW. Appreciate psychiatric consultation and we sign off as of today Please contact [phone] or [phone] if needs further assistance   Disposition: Patient does not meet criteria for psychiatric inpatient admission. Supportive therapy provided about ongoing stressors.  Ambrose Finland, MD 09/19/2016 1:24 PM

## 2016-09-19 NOTE — Care Management Note (Signed)
Case Management Note  Patient Details  Name: Brandi Love MRN: [id number] Date of Birth: 02/17/1976  Subjective/Objective:  From home alone,   Presents with borderline personality disorder, ckd, hypothyroidism, MDD, sepsis, and acute pyelonephritis. CSW facilitating eating do txt, She has Medicare/Medicaid insurance.  PCP  Molli Barrows                   Action/Plan: NCM will follow for dc needs.  Expected Discharge Date:                  Expected Discharge Plan:     In-House Referral:     Discharge planning Services  CM Consult  Post Acute Care Choice:    Choice offered to:     DME Arranged:    DME Agency:     HH Arranged:    HH Agency:     Status of Service:  In process, will continue to follow  If discussed at Long Length of Stay Meetings, dates discussed:    Additional Comments:  Zenon Mayo, RN 09/19/2016, 12:27 PM

## 2016-09-19 NOTE — Clinical Social Work Note (Addendum)
Clinical Social Work Assessment  Patient Details  Name: Brandi Love MRN: [id number] Date of Birth: 05/27/1975  Date of referral:  09/19/16               Reason for consult:  Facility Placement, Mental Health Concerns                Permission sought to share information with:  Facility Sport and exercise psychologist Permission granted to share information::  Yes, Verbal Permission Granted  Name::     Claria Dice  Agency::     Relationship::  Eating disorder dietian and therapist, also received permission to speak with pt mom  Contact Information:  [phone], [phone]  Housing/Transportation Living arrangements for the past 2 months:  Apartment Source of Information:  Patient Patient Interpreter Needed:  None Criminal Activity/Legal Involvement Pertinent to Current Situation/Hospitalization:  No - Comment as needed Significant Relationships:  Parents, Mental Health Provider Lives with:  Self Do you feel safe going back to the place where you live?    Need for family participation in patient care:  No (Coment)  Care giving concerns:  Patient has not eaten in 2 weeks and acutely ill/weak.    Social Worker assessment / plan: CSW spoke with pt concerning request for inpatient treatment.  Patient is flat during interview but answered questions appropriately.  CSW inquired about current treatment for eating disorder.  Patient reports she meets with [facility] eating disorder dietician 1x week and therapists 2x week.  Pt states they are already looking into inpatient rehab treatment for her and gives verbal permission for CSW to speak with her providers as well as pt mom.  CSW spoke with pt therapist about pt current condition.  Patient was reportedly stable prior to last couple of weeks.  Therapist ws on vacation for 3 weeks (has been therapist for 4 years) and then last week pt dietician left for vacation- therapists thinks suboptimal therapy is likely part of what led to current  admission.  Pt has ACT team through [facility]- they have been seeing pt daily last few weeks due to relapse.  Pt PCP is Dr. Tammi Klippel and pt also followed through [facility].  Therapists reports that pt is flat at baseline and often does not show signs of pain or discomfort.  States that pt is capable of being difficult when not consistent with medications.  Pt dietician, Mickel Baas, had been looking into inpatient programs for pt but had not sent out official referrals.  Therapist reports that last week was IVC'd at [facility] for 3-5 days.  CSW spoke with pt dietician for [facility] concerning pt condition.  Reports that pt has not been eating closer to 3 weeks at this point and has lost 20-25 pounds.  Reports patient has been very stable with her eating disorder but was recently discharged from mental health facility where she was for a couple of months for treatment.  When pt discharged home her mother came to stay and help patient- Mickel Baas reports mom put patient on a diet to try and get her to eat healthier foods and thinks this lead to patient relapse- patient began restricting after mom left to return home. Employment status:  Disabled (Comment on whether or not currently receiving Disability) Insurance information:  Medicare PT Recommendations:  Not assessed at this time Information / Referral to community resources:  Inpatient Psychiatric Care (Comment Required) (eating disorder treatment center)  Patient/Family's Response to care:  Pt is agreeable to rehab  placement and hopeful she can get someplace where she can get help  Patient/Family's Understanding of and Emotional Response to Diagnosis, Current Treatment, and Prognosis:  Pt hopeful for treatment placement.  Unclear level of understanding of current medical condition- continuing to refuse most treatment.  Emotional Assessment Appearance:  Appears stated age Attitude/Demeanor/Rapport:  Sedated Affect (typically  observed):  Flat Orientation:  Oriented to Situation, Oriented to  Time, Oriented to Place, Oriented to Self Alcohol / Substance use:  Not Applicable Psych involvement (Current and /or in the community):  Yes (Comment) (awaiting psych consult)  Discharge Needs  Concerns to be addressed:  Care Coordination Readmission within the last 30 days:  No Current discharge risk:  Physical Impairment Barriers to Discharge:  Continued Medical Work up   Jorge Ny, LCSW 09/19/2016, 11:10 AM

## 2016-09-19 NOTE — Progress Notes (Signed)
Pt temps have been inconsistent, ranging from 103 to 101, given tylenol, no improvements, contacted MD and he order different antibiotics.

## 2016-09-19 NOTE — Progress Notes (Signed)
Patient yelled and screamed when staff attempted to take her CBG- refused. Refused all medicine except melatonin- stating they all "hurt her belly." Patient requested another ensure. States "i'm supposed to drink 4 ensure a day and nothing else."  Milford Cage, Therapist, sports

## 2016-09-19 NOTE — Progress Notes (Signed)
Patient refused lunch tray. Stated to "get it out of her room". Patient also continues to refuse PO meds. Patient will drink her ensure.

## 2016-09-19 NOTE — Progress Notes (Signed)
Pt temp 102, gave tylnedol rectal, tried to put ice under arms, pt states she can't take the coldness and move them away. Will cont to monitor temp.

## 2016-09-19 NOTE — Progress Notes (Signed)
Pharmacy Antibiotic Note  Liliann File Milles is a 41 y.o. female admitted on 09/18/2016 with urosepsis.  Pharmacy has been consulted for Vancomycin and Zosyn  dosing.  Plan: Vancomycin 1250 mg IV q24h Zosyn 3.375 g IV q8h    Height: 5\' 3"  (160 cm) Weight: 165 lb 2 oz (74.9 kg) IBW/kg (Calculated) : 52.4  Temp (24hrs), Avg:101.5 F (38.6 C), Min:100.6 F (38.1 C), Max:102.7 F (39.3 C)   Recent Labs Lab 09/18/16 1104 09/18/16 1129 09/18/16 1418 09/18/16 2225 09/19/16 0340  WBC 12.4*  --   --   --  9.0  CREATININE 1.90*  --   --  1.66* 1.55*  LATICACIDVEN  --  2.69* 2.77* 2.6* 2.3*    Estimated Creatinine Clearance: 47.7 mL/min (A) (by C-G formula based on SCr of 1.55 mg/dL (H)).    Allergies  Allergen Reactions  . Dhea [Nutritional Supplements] Nausea And Vomiting  . Entex Lq [Phenylephrine-Guaifenesin] Shortness Of Breath and Swelling    Throat swelling  . Indomethacin Nausea And Vomiting  . Iodinated Diagnostic Agents Anaphylaxis    'cant breathe'  . Propoxyphene Anaphylaxis  . Risperidone And Related Other (See Comments)    Out of body experience  . Shellfish Allergy Other (See Comments)    unspecified  . Depakote [Divalproex Sodium] Nausea And Vomiting and Other (See Comments)    Weight gain  . Abilify [Aripiprazole] Nausea And Vomiting  . Dihydroergotamine Nausea And Vomiting  . Doxycycline Nausea And Vomiting  . Erythromycin Nausea And Vomiting  . Lactose Intolerance (Gi) Nausea And Vomiting  . Nsaids Other (See Comments)    Unable to ttake due to renal insufficency  . Tape Other (See Comments)    Redness, can use paper tape     Caryl Pina 09/19/2016 4:53 AM

## 2016-09-20 DIAGNOSIS — F333 Major depressive disorder, recurrent, severe with psychotic symptoms: Secondary | ICD-10-CM

## 2016-09-20 DIAGNOSIS — F603 Borderline personality disorder: Secondary | ICD-10-CM

## 2016-09-20 DIAGNOSIS — E038 Other specified hypothyroidism: Secondary | ICD-10-CM

## 2016-09-20 DIAGNOSIS — F509 Eating disorder, unspecified: Secondary | ICD-10-CM

## 2016-09-20 DIAGNOSIS — F23 Brief psychotic disorder: Secondary | ICD-10-CM

## 2016-09-20 LAB — GLUCOSE, CAPILLARY: GLUCOSE-CAPILLARY: 130 mg/dL — AB (ref 65–99)

## 2016-09-20 LAB — COMPREHENSIVE METABOLIC PANEL
ALT: 19 U/L (ref 14–54)
AST: 15 U/L (ref 15–41)
Albumin: 2.4 g/dL — ABNORMAL LOW (ref 3.5–5.0)
Alkaline Phosphatase: 67 U/L (ref 38–126)
Anion gap: 8 (ref 5–15)
BUN: 16 mg/dL (ref 6–20)
CHLORIDE: 110 mmol/L (ref 101–111)
CO2: 23 mmol/L (ref 22–32)
Calcium: 9 mg/dL (ref 8.9–10.3)
Creatinine, Ser: 1.5 mg/dL — ABNORMAL HIGH (ref 0.44–1.00)
GFR, EST AFRICAN AMERICAN: 50 mL/min — AB (ref >60)
GFR, EST NON AFRICAN AMERICAN: 43 mL/min — AB (ref >60)
Glucose, Bld: 127 mg/dL — ABNORMAL HIGH (ref 65–99)
Potassium: 4 mmol/L (ref 3.5–5.1)
SODIUM: 141 mmol/L (ref 135–145)
Total Bilirubin: 0.9 mg/dL (ref 0.3–1.2)
Total Protein: 5.5 g/dL — ABNORMAL LOW (ref 6.5–8.1)

## 2016-09-20 LAB — CBC
HCT: 31.6 % — ABNORMAL LOW (ref 36.0–46.0)
Hemoglobin: 10.4 g/dL — ABNORMAL LOW (ref 12.0–15.0)
MCH: 30 pg (ref 26.0–34.0)
MCHC: 32.9 g/dL (ref 30.0–36.0)
MCV: 91.1 fL (ref 78.0–100.0)
Platelets: 174 10*3/uL (ref 150–400)
RBC: 3.47 MIL/uL — ABNORMAL LOW (ref 3.87–5.11)
RDW: 13.5 % (ref 11.5–15.5)
WBC: 7.1 10*3/uL (ref 4.0–10.5)

## 2016-09-20 LAB — LACTIC ACID, PLASMA: LACTIC ACID, VENOUS: 1 mmol/L (ref 0.5–1.9)

## 2016-09-20 MED ORDER — ENSURE ENLIVE PO LIQD
237.0000 mL | Freq: Four times a day (QID) | ORAL | Status: DC
  Administered 2016-09-20 – 2016-09-26 (×22): 237 mL via ORAL

## 2016-09-20 MED ORDER — HALOPERIDOL LACTATE 5 MG/ML IJ SOLN
2.0000 mg | Freq: Four times a day (QID) | INTRAMUSCULAR | Status: DC | PRN
  Administered 2016-09-20: 2 mg via INTRAVENOUS
  Administered 2016-09-20: 5 mg via INTRAVENOUS
  Filled 2016-09-20 (×2): qty 1

## 2016-09-20 MED ORDER — ONDANSETRON HCL 4 MG/2ML IJ SOLN
4.0000 mg | Freq: Four times a day (QID) | INTRAMUSCULAR | Status: DC | PRN
  Administered 2016-09-24 – 2016-09-25 (×3): 4 mg via INTRAVENOUS
  Filled 2016-09-20 (×4): qty 2

## 2016-09-20 MED ORDER — LEVOTHYROXINE SODIUM 100 MCG IV SOLR
75.0000 ug | Freq: Every day | INTRAVENOUS | Status: DC
  Administered 2016-09-21 – 2016-09-25 (×5): 75 ug via INTRAVENOUS
  Filled 2016-09-20 (×5): qty 5

## 2016-09-20 MED ORDER — HALOPERIDOL LACTATE 5 MG/ML IJ SOLN
2.0000 mg | Freq: Three times a day (TID) | INTRAMUSCULAR | Status: DC
  Administered 2016-09-20 – 2016-09-21 (×2): 2 mg via INTRAVENOUS
  Filled 2016-09-20 (×2): qty 1

## 2016-09-20 MED ORDER — DIPHENHYDRAMINE HCL 25 MG PO CAPS
25.0000 mg | ORAL_CAPSULE | Freq: Four times a day (QID) | ORAL | Status: DC | PRN
  Administered 2016-09-20 – 2016-09-25 (×2): 25 mg via ORAL
  Filled 2016-09-20 (×4): qty 1

## 2016-09-20 MED ORDER — HALOPERIDOL LACTATE 5 MG/ML IJ SOLN
INTRAMUSCULAR | Status: AC
  Filled 2016-09-20: qty 1

## 2016-09-20 MED ORDER — DEXTROSE 5 % IV SOLN
1.0000 g | INTRAVENOUS | Status: DC
  Administered 2016-09-20 – 2016-09-22 (×3): 1 g via INTRAVENOUS
  Filled 2016-09-20 (×4): qty 10

## 2016-09-20 MED ORDER — DIPHENHYDRAMINE HCL 50 MG/ML IJ SOLN
50.0000 mg | Freq: Once | INTRAMUSCULAR | Status: AC | PRN
  Administered 2016-09-20: 50 mg via INTRAVENOUS
  Filled 2016-09-20: qty 1

## 2016-09-20 NOTE — Progress Notes (Signed)
Pharmacy Antibiotic Note  Brandi Love is a 41 y.o. female currently on day #2 vancomycin and zosyn for urosepsis now to de-escalate to ceftriaxone. Cultures remain negative.   Plan: 1) Ceftriaxone 1g IV q24 2) Continue to follow cultures, LOT  Height: 5\' 3"  (160 cm) Weight: 165 lb 2 oz (74.9 kg) IBW/kg (Calculated) : 52.4  Temp (24hrs), Avg:98.8 F (37.1 C), Min:97.6 F (36.4 C), Max:100 F (37.8 C)   Recent Labs Lab 09/18/16 1104 09/18/16 1129 09/18/16 1418 09/18/16 2225 09/19/16 0340 09/20/16 0637 09/20/16 0735  WBC 12.4*  --   --   --  9.0 7.1  --   CREATININE 1.90*  --   --  1.66* 1.55* 1.50*  --   LATICACIDVEN  --  2.69* 2.77* 2.6* 2.3*  --  1.0    Estimated Creatinine Clearance: 49.3 mL/min (A) (by C-G formula based on SCr of 1.5 mg/dL (H)).    Allergies  Allergen Reactions  . Dhea [Nutritional Supplements] Nausea And Vomiting  . Entex Lq [Phenylephrine-Guaifenesin] Shortness Of Breath and Swelling    Throat swelling  . Indomethacin Nausea And Vomiting  . Iodinated Diagnostic Agents Anaphylaxis    'cant breathe'  . Propoxyphene Anaphylaxis  . Risperidone And Related Other (See Comments)    Out of body experience  . Shellfish Allergy Other (See Comments)    unspecified  . Depakote [Divalproex Sodium] Nausea And Vomiting and Other (See Comments)    Weight gain  . Abilify [Aripiprazole] Nausea And Vomiting  . Dihydroergotamine Nausea And Vomiting  . Doxycycline Nausea And Vomiting  . Erythromycin Nausea And Vomiting  . Lactose Intolerance (Gi) Nausea And Vomiting  . Nsaids Other (See Comments)    Unable to ttake due to renal insufficency  . Tape Other (See Comments)    Redness, can use paper tape    Antimicrobials this admission: Vancomycin 5/31>> 6/1 Zosyn 5/31>> 6/1 Ceftriaxone 5/30 x1, resume 6/1 >>   Microbiology results: 5/30: Urine >> multiple species present 5/30: Blood >> ngtd 6/1: Urine >>  Thank you for allowing pharmacy to be a  part of this patient's care.  Deboraha Sprang 09/20/2016 6:27 PM

## 2016-09-20 NOTE — Progress Notes (Signed)
Patient having frequent bouts of screaming out and panic attacks. Refused to take any of her PO psych meds tonight. Began kicking her bed and screaming that she couldn't take it anymore and wanted her central line out. Several attempts made to explain why she could not pull it out. She proceeded to keep reaching for it and acting like she would pull it out. Placed call to MD on call and requested he come to bedside. Also called [facility] to observe the situation. After discussion with MD- pt given haldol. Stayed near patient to ensure she did not attempt removal of central line. Mitts placed, but they only agitated her more. Removed and stayed with patient until Haldol successfully took effect.   Milford Cage, RN

## 2016-09-20 NOTE — Progress Notes (Signed)
Patient screaming out and upon entering patient room she is yelling that she is itching. Vistaril given to anxiety/ itching- pt continues screaming out that she "can't take it." states her legs and chest and neck are itching. No signs of hives or rashes. MD paged and order given for Benadryl and Zofran. Improving with benadryl at this time.   Milford Cage, RN

## 2016-09-20 NOTE — Plan of Care (Signed)
Problem: Education: Goal: Knowledge of [facility] Education information/materials will improve Outcome: Progressing Patient refusing to take all psych meds. Became very agitated and had panic attacks on multiple occasions. The first was intense itching and the second was in regard to central line. Patient extremely agitated and required medication to get her to calm down.

## 2016-09-20 NOTE — Progress Notes (Addendum)
3:30pm Contacted the following programs for eating disorders:  [facility] in [place]- left message for intake  [facility] in [place]- can't take [place] but will take some single case agreements  [facility] in [place], [place] left message  [place], [place]- unable to accept [employer], Medicaid   11am CSW completed [facility] application with patient and faxed to [facility] for review- awaiting response- left messages with two intake coordinators  Jorge Ny, [place] Social Worker [phone]

## 2016-09-20 NOTE — Progress Notes (Signed)
PROGRESS NOTE    Brandi Love  PYK:[id number] DOB: [birth year] DOA: 09/18/2016 PCP: Vickii Penna, MD   Brief Narrative:  41 y.o.WF PMHx Anxiety/Depression, Deaf(cochlear implants), CKD, diabetes, eating disorders, endometriosis, GERD, hyperlipidemia, hypertension, hypothyroidism, migraines, DVT/pulmonary embolus, seizures. Patient attended to at time of presentation by her in-home health care aide. Limited history provided by patient due to acuity of illness, difficulty hearing and patient not wanting to participate in exam. Patient with very little oral intake over the last 2 days. Intermittent dry cough over the last days. Suprapubic abdominal pain consistent with the patient describes as her UTI symptoms. Urine is become very dark, odorous and quality. Denies any fevers, chest pain, palpitations, diarrhea, focal neurological deficit, headache. Does endorse bilateral flank pain. No further history able to be obtained.    Subjective: 6/1 difficult to arouse, once awake patient refuses to speak. However RN staff states that will speak at times. Patient becomes extremely agitated and violent if food is brought into her room or if you attempt to administer PO medication.    Assessment & Plan:   Principal Problem:   Eating disorder, unspecified Active Problems:   Borderline personality disorder   CKD (chronic kidney disease) stage 3, GFR 30-59 ml/min   Hypothyroidism   MDD (major depressive disorder), recurrent, severe, with psychosis ([place])   Sepsis ([place])   Acute pyelonephritis   Sepsis: Pyelonephritis  (lactic acid 2.77, wbc 12.4)  -Normal saline 100 ml/hr -Blood culture NGTD -6/1 urine culture repeated -Lactic acidosis normalized -DC vancomycin/Zosyn: Start ceftriaxone for pyelonephritis  Hypotension  -Resolved   Tachycardia  -likely from sepsis -Check trop: Negative  -Check d dimer: Negative  Depression/Eating disorder/ Acute Psychosis/HA: -Currently patient  totally uncooperative with taking medication. On 6/2 reconsult Psychiatry appears will require IVC -Seroquel on hold patient refuses all oral medication  -Latuda 40 mg daily Crush tablet and stir into the patients ensure -Haldol 5 mg daily PO ---> 2 mg IV TID - Effexor on hold patient refuses all oral medication  -Topamax, Imitrex:on hold patient refuses all oral medication   CKD: Cr 1.9. At [facility] Lab Results  Component Value Date   CREATININE 1.50 (H) 09/20/2016   CREATININE 1.55 (H) 09/19/2016   CREATININE 1.66 (H) 09/18/2016  -At baseline   Seizures: -Lamictal  200 mg daily: Crush tablet and stir into the patients ensure  Hypothyroidism,: -Synthroid PO---> Synthroid 75 g IV daily  Hypertension: -Hold metoprolol due to hypotension  Chronic pain: -continue Flexeril if patient will take PO meds  Hyperlipidemia: -continue Lipitor  GERD: -continue protonix  Insomnia -continue melatonoin  Prediabetes:  -Diet controlled. -CBG before meals at bedtime   DVT prophylaxis: Subcutaneous heparin Code Status: Full Family Communication: None Disposition Plan: IVC?     Consultants:  Psychiatry  Procedures/Significant Events:  NA   VENTILATOR SETTINGS: NA   Cultures 5/30 Blood culture Rigt AC;  NGTD 5/30 Blood culture Right hand: NGTD 5/30 urine positive multiple species    Antimicrobials:Anti-infectives    Start     Stop   09/20/16 2200  cefTRIAXone (ROCEPHIN) 1 g in dextrose 5 % 50 mL IVPB         09/19/16 1200  cefTRIAXone (ROCEPHIN) 1 g in dextrose 5 % 50 mL IVPB  Status:  Discontinued     09/19/16 0450   09/19/16 0600  vancomycin (VANCOCIN) 1,250 mg in sodium chloride 0.9 % 250 mL IVPB  Status:  Discontinued     09/20/16 1820   09/19/16 0600  piperacillin-tazobactam (ZOSYN) IVPB 3.375 g  Status:  Discontinued     09/20/16 1820   09/18/16 1200  cefTRIAXone (ROCEPHIN) 2 g in dextrose 5 % 50 mL IVPB     09/18/16 1304        Devices NA   LINES / TUBES:  NA    Continuous Infusions: . sodium chloride 100 mL/hr at 09/18/16 1958  . piperacillin-tazobactam (ZOSYN)  IV 3.375 g (09/20/16 0509)  . vancomycin Stopped (09/20/16 5573)     Objective: Vitals:   09/19/16 2300 09/20/16 0000 09/20/16 0100 09/20/16 0410  BP: (!) 138/99 114/83 113/85   Pulse: (!) 105 (!) 114 (!) 101   Resp: (!) 26 18 (!) 24   Temp:  99.3 F (37.4 C)  98.8 F (37.1 C)  TempSrc:  Oral  Oral  SpO2: 99% 97% 97%   Weight:      Height:        Intake/Output Summary (Last 24 hours) at 09/20/16 0731 Last data filed at 09/20/16 0550  Gross per 24 hour  Intake             3080 ml  Output             3650 ml  Net             -570 ml   Filed Weights   09/18/16 2302  Weight: 165 lb 2 oz (74.9 kg)    Examination:  General: Patient sleepy upon exam, was arousable. Unable/unwilling to follow commands, No acute respiratory distress Eyes: negative scleral hemorrhage, negative anisocoria, negative icterus ENT: Negative Runny nose, negative gingival bleeding, Neck:  Negative scars, masses, torticollis, lymphadenopathy, JVD, right IJ triple-lumen present negative sign of infection Lungs: Clear to auscultation bilaterally without wheezes or crackles Cardiovascular: Regular rate and rhythm without murmur gallop or rub normal S1 and S2 Abdomen: Positive suprapubic pain to palpation, positive CVA tenderness bilateral  Rt> Lt , positive bowel sounds, no rebound, no ascites, no appreciable mass Extremities: No significant cyanosis, clubbing, or edema bilateral lower extremities Skin: Negative rashes, lesions, ulcers Psychiatric:  Unable to evaluate. Patient not cooperative.  Central nervous system:  Patient spontaneously moves all extremities, awake. Unable to complete exam   .     Data Reviewed: Care during the described time interval was provided by me .  I have reviewed this patient's available data, including medical history,  events of note, physical examination, and all test results as part of my evaluation. I have personally reviewed and interpreted all radiology studies.  CBC:  Recent Labs Lab 09/18/16 1104 09/19/16 0340 09/20/16 0637  WBC 12.4* 9.0 7.1  HGB 13.2 10.4* 10.4*  HCT 38.8 31.7* 31.6*  MCV 91.3 93.0 91.1  PLT 245 189 220   Basic Metabolic Panel:  Recent Labs Lab 09/18/16 1104 09/18/16 2225 09/19/16 0340  NA 134* 139 136  K 4.5 4.0 3.9  CL 101 108 108  CO2 21* 21* 21*  GLUCOSE 132* 126* 139*  BUN 17 14 14   CREATININE 1.90* 1.66* 1.55*  CALCIUM 9.4 8.5* 8.1*   GFR: Estimated Creatinine Clearance: 47.7 mL/min (A) (by C-G formula based on SCr of 1.55 mg/dL (H)). Liver Function Tests:  Recent Labs Lab 09/18/16 1104 09/18/16 2225  AST 15 18  ALT 19 17  ALKPHOS 69 66  BILITOT 0.9 0.5  PROT 6.4* 6.0*  ALBUMIN 3.4* 2.9*    Recent Labs Lab 09/18/16 1104  LIPASE 37   No results for input(s): AMMONIA in  the last 168 hours. Coagulation Profile:  Recent Labs Lab 09/18/16 2225  INR 1.13   Cardiac Enzymes:  Recent Labs Lab 09/19/16 0835  TROPONINI <0.03   BNP (last 3 results) No results for input(s): PROBNP in the last 8760 hours. HbA1C: No results for input(s): HGBA1C in the last 72 hours. CBG:  Recent Labs Lab 09/19/16 0743 09/19/16 1200 09/19/16 1546  GLUCAP 93 142* 134*   Lipid Profile: No results for input(s): CHOL, HDL, LDLCALC, TRIG, CHOLHDL, LDLDIRECT in the last 72 hours. Thyroid Function Tests: No results for input(s): TSH, T4TOTAL, FREET4, T3FREE, THYROIDAB in the last 72 hours. Anemia Panel: No results for input(s): VITAMINB12, FOLATE, FERRITIN, TIBC, IRON, RETICCTPCT in the last 72 hours. Urine analysis:    Component Value Date/Time   COLORURINE YELLOW 09/18/2016 1354   APPEARANCEUR CLOUDY (A) 09/18/2016 1354   LABSPEC 1.011 09/18/2016 1354   PHURINE 5.0 09/18/2016 1354   GLUCOSEU NEGATIVE 09/18/2016 1354   HGBUR MODERATE (A)  09/18/2016 1354   BILIRUBINUR NEGATIVE 09/18/2016 1354   KETONESUR NEGATIVE 09/18/2016 1354   PROTEINUR 30 (A) 09/18/2016 1354   UROBILINOGEN 0.2 12/14/2012 0816   NITRITE POSITIVE (A) 09/18/2016 1354   LEUKOCYTESUR LARGE (A) 09/18/2016 1354   Sepsis Labs: @LABRCNTIP (procalcitonin:4,lacticidven:4)  ) Recent Results (from the past 240 hour(s))  Blood Culture (routine x 2)     Status: None (Preliminary result)   Collection Time: 09/18/16 12:02 PM  Result Value Ref Range Status   Specimen Description BLOOD RIGHT ANTECUBITAL  Final   Special Requests   Final    BOTTLES DRAWN AEROBIC AND ANAEROBIC Blood Culture adequate volume   Culture NO GROWTH 1 DAY  Final   Report Status PENDING  Incomplete  Blood Culture (routine x 2)     Status: None (Preliminary result)   Collection Time: 09/18/16 12:25 PM  Result Value Ref Range Status   Specimen Description BLOOD RIGHT HAND  Final   Special Requests IN PEDIATRIC BOTTLE Blood Culture adequate volume  Final   Culture NO GROWTH 1 DAY  Final   Report Status PENDING  Incomplete  Urine culture     Status: Abnormal   Collection Time: 09/18/16  1:54 PM  Result Value Ref Range Status   Specimen Description URINE, RANDOM  Final   Special Requests NONE  Final   Culture MULTIPLE SPECIES PRESENT, SUGGEST RECOLLECTION (A)  Final   Report Status 09/19/2016 FINAL  Final         Radiology Studies: Dg Chest Port 1 View  Result Date: 09/18/2016 CLINICAL DATA:  Encounter for central line placement EXAM: PORTABLE CHEST 1 VIEW COMPARISON:  06/23/2014 CXR FINDINGS: The heart size and mediastinal contours are within normal limits. New right IJ central line catheter tip is seen at the cavoatrial junction. No pneumothorax. Both lungs are clear. The visualized skeletal structures are unremarkable. IMPRESSION: No pneumothorax status post right IJ central line catheter placement. Tip is at the cavoatrial junction. Electronically Signed   By: Ashley Royalty M.D.   On:  09/18/2016 21:42        Scheduled Meds: . aspirin EC  81 mg Oral QHS  . atorvastatin  20 mg Oral q1800  . Chlorhexidine Gluconate Cloth  6 each Topical Daily  . feeding supplement (ENSURE ENLIVE)  237 mL Oral BID BM  . haloperidol  5 mg Oral QHS  . heparin  5,000 Units Subcutaneous Q8H  . lamoTRIgine  200 mg Oral Daily  . levothyroxine  150 mcg Oral QAC  breakfast  . lurasidone  40 mg Oral q morning - 10a  . Melatonin  9 mg Oral QHS  . pantoprazole  40 mg Oral Daily  . QUEtiapine  150 mg Oral QHS  . sodium chloride flush  10-40 mL Intracatheter Q12H  . topiramate  50 mg Oral BID  . venlafaxine XR  150 mg Oral Q breakfast   Continuous Infusions: . sodium chloride 100 mL/hr at 09/18/16 1958  . piperacillin-tazobactam (ZOSYN)  IV 3.375 g (09/20/16 0509)  . vancomycin Stopped (09/20/16 0639)     LOS: 2 days    Time spent: 40 minutes    Kennadi Albany, Geraldo Docker, MD [facility] Pager [phone]   If 7PM-7AM, please contact night-coverage [email or web address] Password [email or web address] 09/20/2016, 7:31 AM

## 2016-09-20 NOTE — Progress Notes (Signed)
Initial Nutrition Assessment  DOCUMENTATION CODES:   Not applicable  INTERVENTION:   -Ensure Enlive po QID, each supplement provides 350 kcal and 20 grams of protein  NUTRITION DIAGNOSIS:   Inadequate oral intake related to chronic illness (ED) as evidenced by meal completion < 25%, per patient/family report.  GOAL:   Patient will meet greater than or equal to 90% of their needs  MONITOR:   PO intake, Supplement acceptance, Labs, Weight trends, Skin, I & O's  REASON FOR ASSESSMENT:   Malnutrition Screening Tool    ASSESSMENT:   Brandi Love is a 41 y.o. female with medical history significant of anxiety/depression, deafness, C KD, diabetes, eating disorders, endometriosis, GERD, hyperlipidemia, hypertension, hypothyroidism, migraines, DVT/pulmonary embolus, seizures. Patient attended to at time of presentation by her in-home health care aide. Limited history provided by patient due to acuity of illness, difficulty hearing and patient not wanting to participate in exam. Patient with very little oral intake over the last 2 days.  Pt admitted with sepsis due to pyelonephritis.   Case discussed with RN, who reports pt has been refusing medications and all food. She confirmed that CSW is working on transfer to ED program.   Spoke with pt, who confirms that she has been working with ED RD, Lynden Ang, for quite some time (visits have been weekly). Per CSW notes, pt has been eating very poorly for approximately 3 weeks and concern is that pt relapsed from ED after pt mother put pt on a diet to encourage healthier eating after a lengthy stay at a mental health facility. Pt reports that she has eaten no food for approximately 9 days and has been using Ensure Plus as her sole source of nutrition. Pt reports that ED RD prescribed 4 supplements per day, however, pt estimates she consumes 2-3 per on average ([phone] kcals, 26-39 grams protein, approximately 42-64% of estimated kcal needs  and 33-49% of estimated protein needs).   Pt estimates that she has lost about 25-30# since January 2018. Reviewed wt hx, which revealed a 15# (8.3%) wt loss over the past 5 months, which is concerning given poor oral intake and hx of eating disorder.   Pt confirms request to not eat food or have meal trays delivered to her room. Per outpatient ED RD notes, choices for oral intake appear limited at baseline (safe foods include cereal, [employer], goldfish, and salad with light dressing). Pt politely declined RD offer to provide safe foods at this time. She is willing to consume Ensure supplement- pt actually consumed about 25% of strawberry Ensure supplement at bedside.   Nutrition-Focused physical exam completed. Findings are no fat depletion, mild (calf only) muscle depletion, and no edema. Examination of eyes, hair, skin, and nails did not reveal any micronutrient deficiencies at this time. Pt reports she has been feeling very weak and was crawling around her home PTA.   RD discussed philosophy of food as medicine and importance of adequate nutrition for healing. Stressed importance of consuming prescribed number of Ensure Enlive supplements, especially if her desire to decline solid food continues. Pt verbalized to this RD intention to consume 4 Ensure Enlive supplements daily, as she agreed to this with ED RD previously. Pt likes flavor of supplements and is pleased that Ensure Enlive provides increased protein vs Ensure Plus. 4 Ensure Enlive supplements will provide 1440 kcals and 80 grams of protein daily, which will meet 87% of estimated kcal needs and 100% of estimated protein needs.  Pt does  not meet criteria for malnutrition at this time, however, at is at high risk of malnutrition given ongoing wt loss, poor oral intake, and hx of eating disorder.   Labs reviewed: CBGS: 130-134.   Diet Order:  Diet regular Room service appropriate? Yes; Fluid consistency: Thin  Skin:  Reviewed, no  issues  Last BM:  09/20/16  Height:   Ht Readings from Last 1 Encounters:  09/18/16 5\' 3"  (1.6 m)    Weight:   Wt Readings from Last 1 Encounters:  09/18/16 165 lb 2 oz (74.9 kg)    Ideal Body Weight:  52.3 kg  BMI:  Body mass index is 29.25 kg/m.  Estimated Nutritional Needs:   Kcal:  1650-1850  Protein:  80-95 grams  Fluid:  >1.6 L  EDUCATION NEEDS:   Education needs addressed  Jurrell Royster A. Jimmye Norman, RD, LDN, CDE Pager: [phone] After hours Pager: [phone]

## 2016-09-20 NOTE — Progress Notes (Addendum)
Pt admitted with sepsis, suspected secondary to pyelonephritis. Treatment has been complicated by pt's frequent agitation and refusal of care. Arrived at bedside to find 3 RN's working to reassure patient and prevent her from pulling out her central line. She does not have any particular concern that we are able to address for her. She is kicking and pulling at her central line, posing a risk to herself. Will give Haldol now. Attempting to avoid physical restraints, but may prove necessary to keep patient from injuring self.

## 2016-09-21 DIAGNOSIS — R319 Hematuria, unspecified: Secondary | ICD-10-CM

## 2016-09-21 LAB — URINE CULTURE: Culture: NO GROWTH

## 2016-09-21 LAB — GLUCOSE, CAPILLARY
GLUCOSE-CAPILLARY: 95 mg/dL (ref 65–99)
GLUCOSE-CAPILLARY: 135 mg/dL — AB (ref 65–99)
GLUCOSE-CAPILLARY: 106 mg/dL — AB (ref 65–99)
Glucose-Capillary: 106 mg/dL — ABNORMAL HIGH (ref 65–99)

## 2016-09-21 MED ORDER — LORAZEPAM 1 MG PO TABS
1.0000 mg | ORAL_TABLET | Freq: Four times a day (QID) | ORAL | Status: DC | PRN
  Administered 2016-09-21: 1 mg via ORAL
  Filled 2016-09-21 (×2): qty 1

## 2016-09-21 MED ORDER — HALOPERIDOL LACTATE 5 MG/ML IJ SOLN: 2.0000 mg | Freq: Four times a day (QID) | INTRAMUSCULAR | Status: DC | PRN

## 2016-09-21 NOTE — Plan of Care (Signed)
Problem: Education: Goal: Knowledge of [facility] Education information/materials will improve Outcome: Progressing Plan of care discussed with patient- regarding transfer to facility for inpatient eating disorder treatment. Patient agreeable. Patient mood much improved with modification to IV haldol scheduled. No mood swings/panic attacks on night shift. Patient only consumed 2.5 ensures in last 24 hours and some apple juice.

## 2016-09-21 NOTE — Progress Notes (Addendum)
Patient refuse Effexor, Topamax, Protonix, Latuda, Lamictal, Heparin injection,Md was made aware via amion this am. Patient did refuse her Lipitor at 1800. [facility] RN.

## 2016-09-21 NOTE — Progress Notes (Signed)
PROGRESS NOTE    Brandi Love  KZS:[id number] DOB: [birth year] DOA: 09/18/2016 PCP: Vickii Penna, MD   Brief Narrative:  41 y.o.WF PMHx Anxiety/Depression, Deaf(cochlear implants), CKD, diabetes, eating disorders, endometriosis, GERD, hyperlipidemia, hypertension, hypothyroidism, migraines, DVT/pulmonary embolus, seizures. Patient attended to at time of presentation by her in-home health care aide. Limited history provided by patient due to acuity of illness, difficulty hearing and patient not wanting to participate in exam. Patient with very little oral intake over the last 2 days. Intermittent dry cough over the last days. Suprapubic abdominal pain consistent with the patient describes as her UTI symptoms. Urine is become very dark, odorous and quality. Denies any fevers, chest pain, palpitations, diarrhea, focal neurological deficit, headache. Does endorse bilateral flank pain. No further history able to be obtained.    Subjective: Patient is difficult to arouse , does not answer any questions, but her mental status intermittent, some time status talk to nursing staffs , has been refusing her medications .  Assessment & Plan:   Principal Problem:   Eating disorder, unspecified Active Problems:   Borderline personality disorder   CKD (chronic kidney disease) stage 3, GFR 30-59 ml/min   Hypothyroidism   MDD (major depressive disorder), recurrent, severe, with psychosis ([place])   Sepsis ([place])   Acute pyelonephritis   Sepsis: Pyelonephritis - Sepsis criteria met on admission, has tachycardic, febrile, with leukocytosis and elevated lactic acid. - Epps is related to pyelonephritis,  - Blood culture with no growth to date, urine growing multiple species,  - Empirically on vancomycin and Zosyn, currently on Rocephin .  Hypotension and tachycardia - Secondary to sepsis, continue with IV fluid, Resolved  Depression/Eating disorder/ Acute Psychosis/HA: - Patient is uncooperative,  fusing to take her medication, seen by psychiatry, condition to continue for current psychotropic medication, including Seroquel, Latuda, Haldol, and Effexor, Topamax and Imitrex, patient has been refusing her meds, seen by psych, she does not meet criteria for psychiatric inpatient admission,. - Patient meets criteria for eating disorder treatment program at [facility], referrals has been made. - Changed Haldol 2 when necessary to see if it's contributing to her sleepiness today(unclear if his sleepiness or she does not want open her eye and interact with staff).   CKD: Cr 1.9. At [facility] Lab Results  Component Value Date   CREATININE 1.50 (H) 09/20/2016   CREATININE 1.55 (H) 09/19/2016   CREATININE 1.66 (H) 09/18/2016  -At baseline   Seizures: -Lamictal  200 mg daily: Crush tablet and stir into the patients ensure  Hypothyroidism,: -Synthroid PO---> Synthroid 75 g IV daily  Hypertension: -Hold metoprolol due to hypotension  Chronic pain: -continue Flexeril if patient will take PO meds  Hyperlipidemia: -continue Lipitor  GERD: -continue protonix  Insomnia -continue melatonoin  Prediabetes:  -Diet controlled. -CBG before meals at bedtime   DVT prophylaxis: Subcutaneous heparin Code Status: Full Family Communication: None Disposition Plan: Pending clinical progression.     Consultants:  Psychiatry  Procedures/Significant Events:  NA   VENTILATOR SETTINGS: NA   Cultures 5/30 Blood culture Rigt AC;  NGTD 5/30 Blood culture Right hand: NGTD 5/30 urine positive multiple species    Antimicrobials:Anti-infectives    Start     Stop   09/20/16 2200  cefTRIAXone (ROCEPHIN) 1 g in dextrose 5 % 50 mL IVPB         09/19/16 1200  cefTRIAXone (ROCEPHIN) 1 g in dextrose 5 % 50 mL IVPB  Status:  Discontinued     09/19/16 0450  09/19/16 0600  vancomycin (VANCOCIN) 1,250 mg in sodium chloride 0.9 % 250 mL IVPB  Status:  Discontinued     09/20/16  1820   09/19/16 0600  piperacillin-tazobactam (ZOSYN) IVPB 3.375 g  Status:  Discontinued     09/20/16 1820   09/18/16 1200  cefTRIAXone (ROCEPHIN) 2 g in dextrose 5 % 50 mL IVPB     09/18/16 1304       Devices NA   LINES / TUBES:  NA    Continuous Infusions: . sodium chloride 100 mL/hr at 09/20/16 2351  . cefTRIAXone (ROCEPHIN)  IV Stopped (09/20/16 2212)     Objective: Vitals:   09/21/16 0341 09/21/16 0500 09/21/16 0600 09/21/16 0700  BP: 121/84 109/80 107/78 118/82  Pulse: 100 77 67 73  Resp: '20 18 19 14  ' Temp: 98.3 F (36.8 C)   99.7 F (37.6 C)  TempSrc: Oral   Axillary  SpO2: 99% 97% 97% 98%  Weight:      Height:        Intake/Output Summary (Last 24 hours) at 09/21/16 1024 Last data filed at 09/21/16 0700  Gross per 24 hour  Intake             2210 ml  Output                0 ml  Net             2210 ml   Filed Weights   09/18/16 2302  Weight: 74.9 kg (165 lb 2 oz)    Examination:  Patient sleeping upon my exam, unwilling to follow commands, does not answer any questions, kept her eye) entire exam  Good air entry bilaterally, clear to auscultation, no wheezing or crackles anteriorly. Soft, nontender, nondistended, bowel sounds present RRR, regular rate and rhythm, no rubs murmurs or gallops Extremity with no edema, clubbing, or cyanosis  .     Data Reviewed: Care during the described time interval was provided by me .  I have reviewed this patient's available data, including medical history, events of note, physical examination, and all test results as part of my evaluation. I have personally reviewed and interpreted all radiology studies.  CBC:  Recent Labs Lab 09/18/16 1104 09/19/16 0340 09/20/16 0637  WBC 12.4* 9.0 7.1  HGB 13.2 10.4* 10.4*  HCT 38.8 31.7* 31.6*  MCV 91.3 93.0 91.1  PLT 245 189 854   Basic Metabolic Panel:  Recent Labs Lab 09/18/16 1104 09/18/16 2225 09/19/16 0340 09/20/16 0637  NA 134* 139 136 141  K 4.5  4.0 3.9 4.0  CL 101 108 108 110  CO2 21* 21* 21* 23  GLUCOSE 132* 126* 139* 127*  BUN '17 14 14 16  ' CREATININE 1.90* 1.66* 1.55* 1.50*  CALCIUM 9.4 8.5* 8.1* 9.0   GFR: Estimated Creatinine Clearance: 49.3 mL/min (A) (by C-G formula based on SCr of 1.5 mg/dL (H)). Liver Function Tests:  Recent Labs Lab 09/18/16 1104 09/18/16 2225 09/20/16 0637  AST '15 18 15  ' ALT '19 17 19  ' ALKPHOS 69 66 67  BILITOT 0.9 0.5 0.9  PROT 6.4* 6.0* 5.5*  ALBUMIN 3.4* 2.9* 2.4*    Recent Labs Lab 09/18/16 1104  LIPASE 37   No results for input(s): AMMONIA in the last 168 hours. Coagulation Profile:  Recent Labs Lab 09/18/16 2225  INR 1.13   Cardiac Enzymes:  Recent Labs Lab 09/19/16 0835  TROPONINI <0.03   BNP (last 3 results) No results for input(s): PROBNP  in the last 8760 hours. HbA1C: No results for input(s): HGBA1C in the last 72 hours. CBG:  Recent Labs Lab 09/19/16 0743 09/19/16 1200 09/19/16 1546 09/20/16 1224  GLUCAP 93 142* 134* 130*   Lipid Profile: No results for input(s): CHOL, HDL, LDLCALC, TRIG, CHOLHDL, LDLDIRECT in the last 72 hours. Thyroid Function Tests: No results for input(s): TSH, T4TOTAL, FREET4, T3FREE, THYROIDAB in the last 72 hours. Anemia Panel: No results for input(s): VITAMINB12, FOLATE, FERRITIN, TIBC, IRON, RETICCTPCT in the last 72 hours. Urine analysis:    Component Value Date/Time   COLORURINE YELLOW 09/18/2016 1354   APPEARANCEUR CLOUDY (A) 09/18/2016 1354   LABSPEC 1.011 09/18/2016 1354   PHURINE 5.0 09/18/2016 1354   GLUCOSEU NEGATIVE 09/18/2016 1354   HGBUR MODERATE (A) 09/18/2016 1354   BILIRUBINUR NEGATIVE 09/18/2016 1354   KETONESUR NEGATIVE 09/18/2016 1354   PROTEINUR 30 (A) 09/18/2016 1354   UROBILINOGEN 0.2 12/14/2012 0816   NITRITE POSITIVE (A) 09/18/2016 1354   LEUKOCYTESUR LARGE (A) 09/18/2016 1354   Sepsis Labs: '@LABRCNTIP' (procalcitonin:4,lacticidven:4)  ) Recent Results (from the past 240 hour(s))  Blood  Culture (routine x 2)     Status: None (Preliminary result)   Collection Time: 09/18/16 12:02 PM  Result Value Ref Range Status   Specimen Description BLOOD RIGHT ANTECUBITAL  Final   Special Requests   Final    BOTTLES DRAWN AEROBIC AND ANAEROBIC Blood Culture adequate volume   Culture NO GROWTH 2 DAYS  Final   Report Status PENDING  Incomplete  Blood Culture (routine x 2)     Status: None (Preliminary result)   Collection Time: 09/18/16 12:25 PM  Result Value Ref Range Status   Specimen Description BLOOD RIGHT HAND  Final   Special Requests IN PEDIATRIC BOTTLE Blood Culture adequate volume  Final   Culture NO GROWTH 2 DAYS  Final   Report Status PENDING  Incomplete  Urine culture     Status: Abnormal   Collection Time: 09/18/16  1:54 PM  Result Value Ref Range Status   Specimen Description URINE, RANDOM  Final   Special Requests NONE  Final   Culture MULTIPLE SPECIES PRESENT, SUGGEST RECOLLECTION (A)  Final   Report Status 09/19/2016 FINAL  Final         Radiology Studies: No results found.      Scheduled Meds: . aspirin EC  81 mg Oral QHS  . atorvastatin  20 mg Oral q1800  . Chlorhexidine Gluconate Cloth  6 each Topical Daily  . feeding supplement (ENSURE ENLIVE)  237 mL Oral QID  . heparin  5,000 Units Subcutaneous Q8H  . lamoTRIgine  200 mg Oral Daily  . levothyroxine  75 mcg Intravenous Daily  . lurasidone  40 mg Oral q morning - 10a  . Melatonin  9 mg Oral QHS  . pantoprazole  40 mg Oral Daily  . QUEtiapine  150 mg Oral QHS  . sodium chloride flush  10-40 mL Intracatheter Q12H  . topiramate  50 mg Oral BID  . venlafaxine XR  150 mg Oral Q breakfast   Continuous Infusions: . sodium chloride 100 mL/hr at 09/20/16 2351  . cefTRIAXone (ROCEPHIN)  IV Stopped (09/20/16 2212)     LOS: 3 days    Phillips Climes, MD [facility] Pager [phone]  If 7PM-7AM, please contact night-coverage [email or web address] Password [facility] 09/21/2016, 10:24 AM

## 2016-09-22 DIAGNOSIS — R569 Unspecified convulsions: Secondary | ICD-10-CM

## 2016-09-22 DIAGNOSIS — G894 Chronic pain syndrome: Secondary | ICD-10-CM

## 2016-09-22 DIAGNOSIS — N390 Urinary tract infection, site not specified: Secondary | ICD-10-CM

## 2016-09-22 DIAGNOSIS — F419 Anxiety disorder, unspecified: Secondary | ICD-10-CM

## 2016-09-22 DIAGNOSIS — R7303 Prediabetes: Secondary | ICD-10-CM

## 2016-09-22 DIAGNOSIS — H9190 Unspecified hearing loss, unspecified ear: Secondary | ICD-10-CM

## 2016-09-22 DIAGNOSIS — I10 Essential (primary) hypertension: Secondary | ICD-10-CM

## 2016-09-22 DIAGNOSIS — K219 Gastro-esophageal reflux disease without esophagitis: Secondary | ICD-10-CM

## 2016-09-22 DIAGNOSIS — E108 Type 1 diabetes mellitus with unspecified complications: Secondary | ICD-10-CM

## 2016-09-22 DIAGNOSIS — I2699 Other pulmonary embolism without acute cor pulmonale: Secondary | ICD-10-CM

## 2016-09-22 LAB — CBC
HCT: 32.1 % — ABNORMAL LOW (ref 36.0–46.0)
Hemoglobin: 10.8 g/dL — ABNORMAL LOW (ref 12.0–15.0)
MCH: 30.5 pg (ref 26.0–34.0)
MCHC: 33.6 g/dL (ref 30.0–36.0)
MCV: 90.7 fL (ref 78.0–100.0)
Platelets: 278 10*3/uL (ref 150–400)
RBC: 3.54 MIL/uL — ABNORMAL LOW (ref 3.87–5.11)
RDW: 13.1 % (ref 11.5–15.5)
WBC: 6.6 10*3/uL (ref 4.0–10.5)

## 2016-09-22 LAB — BASIC METABOLIC PANEL
Anion gap: 8 (ref 5–15)
BUN: 16 mg/dL (ref 6–20)
CO2: 22 mmol/L (ref 22–32)
Calcium: 10.2 mg/dL (ref 8.9–10.3)
Chloride: 109 mmol/L (ref 101–111)
Creatinine, Ser: 1.21 mg/dL — ABNORMAL HIGH (ref 0.44–1.00)
GFR calc Af Amer: >60 mL/min (ref >60)
GFR calc non Af Amer: 56 mL/min — ABNORMAL LOW (ref >60)
Glucose, Bld: 116 mg/dL — ABNORMAL HIGH (ref 65–99)
Potassium: 4.4 mmol/L (ref 3.5–5.1)
Sodium: 139 mmol/L (ref 135–145)

## 2016-09-22 LAB — MRSA PCR SCREENING: MRSA BY PCR: POSITIVE — AB

## 2016-09-22 MED ORDER — MUPIROCIN 2 % EX OINT
1.0000 "application " | TOPICAL_OINTMENT | Freq: Two times a day (BID) | CUTANEOUS | Status: AC
  Administered 2016-09-23 – 2016-09-26 (×6): 1 via NASAL
  Filled 2016-09-22: qty 22

## 2016-09-22 MED ORDER — HALOPERIDOL LACTATE 5 MG/ML IJ SOLN
2.0000 mg | Freq: Four times a day (QID) | INTRAMUSCULAR | Status: DC
  Administered 2016-09-22 – 2016-09-24 (×5): 2 mg via INTRAVENOUS
  Filled 2016-09-22 (×5): qty 1

## 2016-09-22 NOTE — Consult Note (Signed)
[facility] Psychiatry Consult   Reason for Consult:  Agitation and refusing to take medicine and food Referring Physician:  Dr.Woods Patient Identification: Brandi Love MRN:  [id number] Principal Diagnosis: Eating disorder, unspecified Diagnosis:   Patient Active Problem List   Diagnosis Date Noted  . Sepsis ([place]) [A41.9] 09/18/2016  . Acute pyelonephritis [N10] 09/18/2016  . MDD (major depressive disorder), recurrent, severe, with psychosis ([place]) [F33.3] 12/11/2014  . Eating disorder, unspecified [F50.9] 11/23/2012  . Hypothyroidism [E03.9] 11/23/2012  . Dyslipidemia [E78.5] 11/23/2012  . Sinus tachycardia [R00.0] 11/23/2012  . CKD (chronic kidney disease) stage 3, GFR 30-59 ml/min [N18.3] 11/18/2012  . Protein-calorie malnutrition, severe ([place]) [E43] 11/13/2012  . Abdominal pain, epigastric [R10.13] 11/12/2012  . Dehydration [E86.0] 11/12/2012  . Acute on chronic renal failure ([place]) [N17.9, N18.9] 11/12/2012  . Leukocytosis, unspecified [D72.829] 11/12/2012  . Hyponatremia [E87.1] 11/12/2012  . Borderline personality disorder [F60.3] 11/12/2012    Total Time spent with patient: 30 minutes  Subjective:   Brandi Love is a 41 y.o. female patient admitted with abdominal pain and decreased oral intake.  HPI:  Patient seen chart reviewed.  Patient is 41 year old female with significant history of depression, anxiety, diabetes, eating disorder endometriosis GERD, hyperlipidemia hearing impairment, hypothyroidism migraine and pulmonary embolism.  Patient was seen by psychiatry a few days ago and recommended to transfer eating disorder treatment program at [facility].  However patient refusing to take the medication and eating.  She also notices irritable, agitated and lately started to hear voices.  She admitted noncompliant with medication for past 2 weeks.  She is feeling more depressed and she like to lose another 30 pounds by restricting her diet.  Patient hospitalize in  the past multiple times and in recent months this is her third hospitalization.  Patient diagnosed with [place] personality disorder and getting treatment in the past DBT.  Patient has at [facility] team.  She is easily irritable and angry and labile.  Patient uses cochlear implant.  She admitted that she lost 28 pounds since February.  Her last hospitalization was at [facility].  She denies any suicidal thoughts but feels very depressed anxious and bothered by the auditory hallucinations.  She has no plan or intent to kill herself but endorsed that her current and concentration is distracted.  She shows poor insight and judgment into her illness.  Though she wants to get better but she is reluctant to take medication and food.  She is prescribed Latuda, Lamictal, Seroquel, Effexor.  She do not recall any side effects of the medication.  Past Psychiatric History: Patient has multiple hospitalization in the past.  She has history of suicidal attempt by strangulating herself while she was in the hospital.  She stated 3 years in [facility].  She was diagnosed with borderline personality disorder, major depression, bipolar disorder and eating disorder.  Risk to Self: Is patient at risk for suicide?: No Risk to Others:   Prior Inpatient Therapy:   Prior Outpatient Therapy:    Past Medical History:  Past Medical History:  Diagnosis Date  . Anemia   . Anxiety   . Arthritis    "hands" (09/18/2016)  . Chronic lower back pain   . Chronic renal insufficiency   . Deafness   . Depression   . Diabetes ([place])    "borderline"  . Eating disorder   . Endometriosis   . GERD (gastroesophageal reflux disease)   . History of kidney stones   . Hyperlipidemia   .  Hypertension   . 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 Migraine    "frequency varies" (09/18/2016)  . OCD (obsessive compulsive disorder)   . Pneumonia 2011  . PONV (postoperative nausea and vomiting)   . Pulmonary embolism ([place])  10/2001  . Seizures ([place]) 09/2001 X 1   "day after my hysterectomy"  . Tachycardia     Past Surgical History:  Procedure Laterality Date  . ABDOMINAL HYSTERECTOMY  09/2001   'except for right ovary'  . BREAST LUMPECTOMY Left 2000   benign  . BREAST LUMPECTOMY Right 2002   benign  . COCHLEAR IMPLANT Bilateral 2004-2010   "right-left"  . EYE MUSCLE SURGERY Left 1980   "lazy eye"  . EYE MUSCLE SURGERY Right 1990   "lazy eye"  . LAPAROSCOPIC ABDOMINAL EXPLORATION  2001 and 2002   for endometriosis   Family History:  Family History  Problem Relation Age of Onset  . Cancer Father   . Hyperlipidemia Father   . Osteoarthritis Mother   . Hyperlipidemia Mother    Family Psychiatric  History: Patient endorsed multiple family member has mental illness. Social History:  History  Alcohol Use No     History  Drug Use No    Social History   Social History  . Marital status: Single    Spouse name: N/A  . Number of children: 0  . Years of education: 14   Occupational History  . Disabled.    Social History Main Topics  . Smoking status: Never Smoker  . Smokeless tobacco: Never Used  . Alcohol use No  . Drug use: No  . Sexual activity: No   Other Topics Concern  . None   Social History Narrative   Lives alone.  Single.  No family in town.     Additional Social History:    Allergies:   Allergies  Allergen Reactions  . Dhea [Nutritional Supplements] Nausea And Vomiting  . Entex Lq [Phenylephrine-Guaifenesin] Shortness Of Breath and Swelling    Throat swelling  . Indomethacin Nausea And Vomiting  . Iodinated Diagnostic Agents Anaphylaxis    'cant breathe'  . Propoxyphene Anaphylaxis  . Risperidone And Related Other (See Comments)    Out of body experience  . Shellfish Allergy Other (See Comments)    unspecified  . Depakote [Divalproex Sodium] Nausea And Vomiting and Other (See Comments)    Weight gain  . Abilify [Aripiprazole] Nausea And Vomiting  .  Dihydroergotamine Nausea And Vomiting  . Doxycycline Nausea And Vomiting  . Erythromycin Nausea And Vomiting  . Lactose Intolerance (Gi) Nausea And Vomiting  . Nsaids Other (See Comments)    Unable to ttake due to renal insufficency  . Tape Other (See Comments)    Redness, can use paper tape    Labs:  Results for orders placed or performed during the hospital encounter of 09/18/16 (from the past 48 hour(s))  Glucose, capillary     Status: None   Collection Time: 09/21/16  9:11 AM  Result Value Ref Range   Glucose-Capillary 95 65 - 99 mg/dL  Glucose, capillary     Status: Abnormal   Collection Time: 09/21/16  2:13 PM  Result Value Ref Range   Glucose-Capillary 135 (H) 65 - 99 mg/dL  Glucose, capillary     Status: Abnormal   Collection Time: 09/21/16  6:05 PM  Result Value Ref Range   Glucose-Capillary 106 (H) 65 - 99 mg/dL  Glucose, capillary     Status:  Abnormal   Collection Time: 09/21/16  9:10 PM  Result Value Ref Range   Glucose-Capillary 106 (H) 65 - 99 mg/dL  CBC     Status: Abnormal   Collection Time: 09/22/16  6:32 AM  Result Value Ref Range   WBC 6.6 4.0 - 10.5 K/uL   RBC 3.54 (L) 3.87 - 5.11 MIL/uL   Hemoglobin 10.8 (L) 12.0 - 15.0 g/dL   HCT 32.1 (L) 36.0 - 46.0 %   MCV 90.7 78.0 - 100.0 fL   MCH 30.5 26.0 - 34.0 pg   MCHC 33.6 30.0 - 36.0 g/dL   RDW 13.1 11.5 - 15.5 %   Platelets 278 150 - 400 K/uL  Basic metabolic panel     Status: Abnormal   Collection Time: 09/22/16  6:32 AM  Result Value Ref Range   Sodium 139 135 - 145 mmol/L   Potassium 4.4 3.5 - 5.1 mmol/L   Chloride 109 101 - 111 mmol/L   CO2 22 22 - 32 mmol/L   Glucose, Bld 116 (H) 65 - 99 mg/dL   BUN 16 6 - 20 mg/dL   Creatinine, Ser 1.21 (H) 0.44 - 1.00 mg/dL   Calcium 10.2 8.9 - 10.3 mg/dL   GFR calc non Af Amer 56 (L) >60 mL/min   GFR calc Af Amer >60 >60 mL/min    Comment: (NOTE) The eGFR has been calculated using the CKD EPI equation. This calculation has not been validated in all  clinical situations. eGFR's persistently <60 mL/min signify possible Chronic Kidney Disease.    Anion gap 8 5 - 15  MRSA PCR Screening     Status: Abnormal   Collection Time: 09/22/16  7:47 AM  Result Value Ref Range   MRSA by PCR POSITIVE (A) NEGATIVE    Comment:        The GeneXpert MRSA Assay (FDA approved for NASAL specimens only), is one component of a comprehensive MRSA colonization surveillance program. It is not intended to diagnose MRSA infection nor to guide or monitor treatment for MRSA infections. RESULT CALLED TO, READ BACK BY AND VERIFIED WITH: Candie Chroman, RN AT [phone] 09/22/16 BY L BENFIELD     Current Facility-Administered Medications  Medication Dose Route Frequency Provider Last Rate Last Dose  . 0.9 %  sodium chloride infusion   Intravenous Continuous Waldemar Dickens, MD 100 mL/hr at 09/22/16 1059    . acetaminophen (TYLENOL) tablet 650 mg  650 mg Oral Q6H PRN Waldemar Dickens, MD       Or  . acetaminophen (TYLENOL) suppository 650 mg  650 mg Rectal Q6H PRN Waldemar Dickens, MD   650 mg at 09/19/16 2241  . aspirin EC tablet 81 mg  81 mg Oral QHS Waldemar Dickens, MD   81 mg at 09/21/16 2109  . atorvastatin (LIPITOR) tablet 20 mg  20 mg Oral q1800 Waldemar Dickens, MD      . cefTRIAXone (ROCEPHIN) 1 g in dextrose 5 % 50 mL IVPB  1 g Intravenous Q24H Allie Bossier, MD   Stopped at 09/21/16 2251  . Chlorhexidine Gluconate Cloth 2 % PADS 6 each  6 each Topical Daily Javier Glazier, MD   6 each at 09/21/16 1743  . cyclobenzaprine (FLEXERIL) tablet 10 mg  10 mg Oral BID PRN Waldemar Dickens, MD      . diphenhydrAMINE (BENADRYL) capsule 25 mg  25 mg Oral Q6H PRN Allie Bossier, MD   25 mg at 09/20/16  1544  . feeding supplement (ENSURE ENLIVE) (ENSURE ENLIVE) liquid 237 mL  237 mL Oral QID Allie Bossier, MD   237 mL at 09/22/16 1000  . haloperidol lactate (HALDOL) injection 2 mg  2 mg Intravenous Q6H Allie Bossier, MD   2 mg at 09/22/16 1058  . heparin injection  5,000 Units  5,000 Units Subcutaneous Q8H Waldemar Dickens, MD   5,000 Units at 09/22/16 0533  . lamoTRIgine (LAMICTAL) tablet 200 mg  200 mg Oral Daily Allie Bossier, MD   200 mg at 09/22/16 1057  . levothyroxine (SYNTHROID, LEVOTHROID) injection 75 mcg  75 mcg Intravenous Daily Allie Bossier, MD   75 mcg at 09/22/16 1059  . LORazepam (ATIVAN) tablet 1 mg  1 mg Oral Q6H PRN Opyd, Ilene Qua, MD   1 mg at 09/21/16 2343  . lurasidone (LATUDA) tablet 40 mg  40 mg Oral q morning - 10a Allie Bossier, MD   40 mg at 09/22/16 1058  . Melatonin TABS 9 mg  9 mg Oral QHS Waldemar Dickens, MD   9 mg at 09/21/16 2109  . mupirocin ointment (BACTROBAN) 2 % 1 application  1 application Nasal BID Allie Bossier, MD      . ondansetron [facility]) injection 4 mg  4 mg Intravenous Q6H PRN Opyd, Ilene Qua, MD      . pantoprazole (PROTONIX) EC tablet 40 mg  40 mg Oral Daily Waldemar Dickens, MD   40 mg at 09/22/16 1058  . promethazine (PHENERGAN) tablet 25 mg  25 mg Oral Q6H PRN Waldemar Dickens, MD      . QUEtiapine (SEROQUEL XR) 24 hr tablet 150 mg  150 mg Oral QHS Waldemar Dickens, MD      . sodium chloride flush (NS) 0.9 % injection 10-40 mL  10-40 mL Intracatheter Q12H Javier Glazier, MD   10 mL at 09/22/16 1100  . sodium chloride flush (NS) 0.9 % injection 10-40 mL  10-40 mL Intracatheter PRN Javier Glazier, MD      . SUMAtriptan (IMITREX) tablet 100 mg  100 mg Oral Q2H PRN Waldemar Dickens, MD      . topiramate (TOPAMAX) tablet 50 mg  50 mg Oral BID Waldemar Dickens, MD   50 mg at 09/22/16 1058  . venlafaxine XR (EFFEXOR-XR) 24 hr capsule 150 mg  150 mg Oral Q breakfast Waldemar Dickens, MD   150 mg at 09/19/16 2774    Musculoskeletal: Strength & Muscle Tone: increased and decreased Gait & Station: Lying on her bed Patient leans: N/A  Psychiatric Specialty Exam: Physical Exam  Review of Systems  HENT: Negative.   Musculoskeletal: Negative.   Skin: Negative.   Neurological: Positive for  weakness.  Psychiatric/Behavioral: Positive for hallucinations.    Blood pressure 102/72, pulse 78, temperature 98.4 F (36.9 C), temperature source Oral, resp. rate 18, height '5\' 3"'  (1.6 m), weight 74.9 kg (165 lb 2 oz), SpO2 97 %.Body mass index is 29.25 kg/m.  General Appearance: Guarded  Eye Contact:  Fair  Speech:  Slow  Volume:  Decreased  Mood:  Anxious, Depressed, Hopeless and Irritable  Affect:  Constricted and Depressed  Thought Process:  Goal Directed  Orientation:  Full (Time, Place, and Person)  Thought Content:  Hallucinations: Auditory and Rumination  Suicidal Thoughts:  No  Homicidal Thoughts:  No  Memory:  Immediate;   Fair Recent;   Fair Remote;   Fair  Judgement:  Impaired  Insight:  Lacking  Psychomotor Activity:  Decreased  Concentration:  Concentration: Fair and Attention Span: Fair  Recall:  Good  Fund of Knowledge:  Good  Language:  Fair  Akathisia:  No  Handed:  Right  AIMS (if indicated):     Assets:  Communication Skills Desire for Improvement Social Support  ADL's:  Impaired  Cognition:  WNL  Sleep:        Treatment Plan Summary: Daily contact with patient to assess and evaluate symptoms and progress in treatment and Encouraged to take the medication and eat  Due to her limited insight and judgment and episodes of agitation she can be placed on IVC.  She can get forced intramuscular medication and food as patient has limited insight and she continued to exhibit psychosis and severe depression.  Once medically stable she can be transferred to eating disorder treatment program at [facility].  Disposition: Supportive therapy provided about ongoing stressors.  Jjesus Dingley T., MD 09/22/2016 1:17 PM

## 2016-09-22 NOTE — Progress Notes (Signed)
PROGRESS NOTE    Brandi Love  ZOX:[id number] DOB: Aug 02, 1975 DOA: 09/18/2016 PCP: Vickii Penna, MD   Brief Narrative:  41 y.o.WF PMHx Anxiety/Depression, Deaf(cochlear implants), CKD, diabetes, eating disorders, endometriosis, GERD, hyperlipidemia, hypertension, hypothyroidism, migraines, DVT/pulmonary embolus, seizures. Patient attended to at time of presentation by her in-home health care aide. Limited history provided by patient due to acuity of illness, difficulty hearing and patient not wanting to participate in exam. Patient with very little oral intake over the last 2 days. Intermittent dry cough over the last days. Suprapubic abdominal pain consistent with the patient describes as her UTI symptoms. Urine is become very dark, odorous and quality. Denies any fevers, chest pain, palpitations, diarrhea, focal neurological deficit, headache. Does endorse bilateral flank pain. No further history able to be obtained.    Subjective: 6/3  difficult to arouse, once awake patient refuses to speak except to say she does not feel well.. Patient still refuses  PO medication.    Assessment & Plan:   Principal Problem:   Eating disorder, unspecified Active Problems:   Borderline personality disorder   CKD (chronic kidney disease) stage 3, GFR 30-59 ml/min   Hypothyroidism   MDD (major depressive disorder), recurrent, severe, with psychosis ([place])   Sepsis ([place])   Acute pyelonephritis   Sepsis: Pyelonephritis  (lactic acid 2.77, wbc 12.4)  -Normal saline 100 ml/hr -Blood culture NGTD -6/1 urine culture negative -Complete 5 day course Ceftriaxone for pyelonephritis  Hypotension  -Resolved   Tachycardia  -likely from sepsis -Check trop: Negative  -Check d dimer: Negative  Depression/Eating disorder/ Acute Psychosis/HA: -Currently patient totally uncooperative with taking medication. On 6/2 reconsulted Psychiatry appears will require IVC -Seroquel on hold patient refuses all  oral medication  -Latuda 40 mg daily Crush tablet and stir into the patients ensure -Haldol 5 mg daily PO ---> 2 mg IV TID - Effexor on hold patient refuses all oral medication  -Topamax, Imitrex:on hold patient refuses all oral medication  -6/3 reconsulted psychiatry secondary to patient's continued psychosis. Continues to refuse oral medication, refuses to communicate with staff and at times will become extremely agitated. -Per Psychiatry concur that patient requires IVC. In addition per their recommendation concur can get forced intramuscular medication and food as patient has limited insight and she continued to exhibit psychosis and severe depression.   -On 6/4 begin IVC    CKD: Cr 1.9. At [facility] Lab Results  Component Value Date   CREATININE 1.50 (H) 09/20/2016   CREATININE 1.55 (H) 09/19/2016   CREATININE 1.66 (H) 09/18/2016  -At baseline   Seizures: -Lamictal  200 mg daily: Crush tablet and stir into the patients ensure  Hypothyroidism,: -Synthroid PO---> Synthroid 75 g IV daily  Hypertension: -Hold metoprolol due to hypotension  Chronic pain: -continue Flexeril if patient will take PO meds  Hyperlipidemia: -continue Lipitor  GERD: -continue protonix  Insomnia -continue melatonoin  Prediabetes:  -Diet controlled. -RN obtain CBG before meals at bedtime from CVL   DVT prophylaxis: Subcutaneous heparin Code Status: Full Family Communication: None Disposition Plan: On 6/4 begin IVC proceedings     Consultants:  Psychiatry Dr.Arfeen, Syed     Procedures/Significant Events:  NA   VENTILATOR SETTINGS: NA   Cultures 5/30 Blood culture Rigt AC;  NGTD 5/30 Blood culture Right hand: NGTD 5/30 urine positive multiple species 6/1 urine negative    Antimicrobials:Anti-infectives    Start     Stop   09/20/16 2200  cefTRIAXone (ROCEPHIN) 1 g in dextrose 5 %  50 mL IVPB         09/19/16 1200  cefTRIAXone (ROCEPHIN) 1 g in dextrose 5 % 50  mL IVPB  Status:  Discontinued     09/19/16 0450   09/19/16 0600  vancomycin (VANCOCIN) 1,250 mg in sodium chloride 0.9 % 250 mL IVPB  Status:  Discontinued     09/20/16 1820   09/19/16 0600  piperacillin-tazobactam (ZOSYN) IVPB 3.375 g  Status:  Discontinued     09/20/16 1820   09/18/16 1200  cefTRIAXone (ROCEPHIN) 2 g in dextrose 5 % 50 mL IVPB     09/18/16 1304       Devices NA   LINES / TUBES:  NA    Continuous Infusions: . sodium chloride 100 mL/hr at 09/21/16 1206  . cefTRIAXone (ROCEPHIN)  IV Stopped (09/21/16 2251)     Objective: Vitals:   09/21/16 1600 09/21/16 1956 09/21/16 2323 09/22/16 0312  BP: 121/88 123/86 (!) 125/95 117/82  Pulse:  91 80 82  Resp:  14 (!) 21 (!) 22  Temp: 98.6 F (37 C) 98.5 F (36.9 C) 98.7 F (37.1 C) 98.2 F (36.8 C)  TempSrc: Oral Oral Oral Oral  SpO2:  98% 98% 97%  Weight:      Height:        Intake/Output Summary (Last 24 hours) at 09/22/16 0643 Last data filed at 09/22/16 0600  Gross per 24 hour  Intake             2450 ml  Output              500 ml  Net             1950 ml   Filed Weights   09/18/16 2302  Weight: 165 lb 2 oz (74.9 kg)    Examination:  General: Patient sleepy upon exam, was arousable. Unable/unwilling to follow commands, No acute respiratory distress Eyes: negative scleral hemorrhage, negative anisocoria, negative icterus ENT: Negative Runny nose, negative gingival bleeding, Neck:  Negative scars, masses, torticollis, lymphadenopathy, JVD, right IJ triple-lumen present negative sign of infection Lungs: Clear to auscultation bilaterally without wheezes or crackles Cardiovascular: Regular rate and rhythm without murmur gallop or rub normal S1 and S2 Abdomen: Positive suprapubic pain to palpation, positive CVA tenderness bilateral  Rt> Lt , positive bowel sounds, no rebound, no ascites, no appreciable mass Extremities: No significant cyanosis, clubbing, or edema bilateral lower extremities Skin:  Negative rashes, lesions, ulcers Psychiatric:  Unable to evaluate. Patient not cooperative.  Central nervous system:  Patient spontaneously moves all extremities, awake. Unable to complete exam   .     Data Reviewed: Care during the described time interval was provided by me .  I have reviewed this patient's available data, including medical history, events of note, physical examination, and all test results as part of my evaluation. I have personally reviewed and interpreted all radiology studies.  CBC:  Recent Labs Lab 09/18/16 1104 09/19/16 0340 09/20/16 0637 09/22/16 0632  WBC 12.4* 9.0 7.1 6.6  HGB 13.2 10.4* 10.4* 10.8*  HCT 38.8 31.7* 31.6* 32.1*  MCV 91.3 93.0 91.1 90.7  PLT 245 189 174 829   Basic Metabolic Panel:  Recent Labs Lab 09/18/16 1104 09/18/16 2225 09/19/16 0340 09/20/16 0637  NA 134* 139 136 141  K 4.5 4.0 3.9 4.0  CL 101 108 108 110  CO2 21* 21* 21* 23  GLUCOSE 132* 126* 139* 127*  BUN 17 14 14 16   CREATININE 1.90* 1.66*  1.55* 1.50*  CALCIUM 9.4 8.5* 8.1* 9.0   GFR: Estimated Creatinine Clearance: 49.3 mL/min (A) (by C-G formula based on SCr of 1.5 mg/dL (H)). Liver Function Tests:  Recent Labs Lab 09/18/16 1104 09/18/16 2225 09/20/16 0637  AST 15 18 15   ALT 19 17 19   ALKPHOS 69 66 67  BILITOT 0.9 0.5 0.9  PROT 6.4* 6.0* 5.5*  ALBUMIN 3.4* 2.9* 2.4*    Recent Labs Lab 09/18/16 1104  LIPASE 37   No results for input(s): AMMONIA in the last 168 hours. Coagulation Profile:  Recent Labs Lab 09/18/16 2225  INR 1.13   Cardiac Enzymes:  Recent Labs Lab 09/19/16 0835  TROPONINI <0.03   BNP (last 3 results) No results for input(s): PROBNP in the last 8760 hours. HbA1C: No results for input(s): HGBA1C in the last 72 hours. CBG:  Recent Labs Lab 09/20/16 1224 09/21/16 0911 09/21/16 1413 09/21/16 1805 09/21/16 2110  GLUCAP 130* 95 135* 106* 106*   Lipid Profile: No results for input(s): CHOL, HDL, LDLCALC, TRIG,  CHOLHDL, LDLDIRECT in the last 72 hours. Thyroid Function Tests: No results for input(s): TSH, T4TOTAL, FREET4, T3FREE, THYROIDAB in the last 72 hours. Anemia Panel: No results for input(s): VITAMINB12, FOLATE, FERRITIN, TIBC, IRON, RETICCTPCT in the last 72 hours. Urine analysis:    Component Value Date/Time   COLORURINE YELLOW 09/18/2016 1354   APPEARANCEUR CLOUDY (A) 09/18/2016 1354   LABSPEC 1.011 09/18/2016 1354   PHURINE 5.0 09/18/2016 1354   GLUCOSEU NEGATIVE 09/18/2016 1354   HGBUR MODERATE (A) 09/18/2016 1354   BILIRUBINUR NEGATIVE 09/18/2016 1354   KETONESUR NEGATIVE 09/18/2016 1354   PROTEINUR 30 (A) 09/18/2016 1354   UROBILINOGEN 0.2 12/14/2012 0816   NITRITE POSITIVE (A) 09/18/2016 1354   LEUKOCYTESUR LARGE (A) 09/18/2016 1354   Sepsis Labs: @LABRCNTIP (procalcitonin:4,lacticidven:4)  ) Recent Results (from the past 240 hour(s))  Blood Culture (routine x 2)     Status: None (Preliminary result)   Collection Time: 09/18/16 12:02 PM  Result Value Ref Range Status   Specimen Description BLOOD RIGHT ANTECUBITAL  Final   Special Requests   Final    BOTTLES DRAWN AEROBIC AND ANAEROBIC Blood Culture adequate volume   Culture NO GROWTH 3 DAYS  Final   Report Status PENDING  Incomplete  Blood Culture (routine x 2)     Status: None (Preliminary result)   Collection Time: 09/18/16 12:25 PM  Result Value Ref Range Status   Specimen Description BLOOD RIGHT HAND  Final   Special Requests IN PEDIATRIC BOTTLE Blood Culture adequate volume  Final   Culture NO GROWTH 3 DAYS  Final   Report Status PENDING  Incomplete  Urine culture     Status: Abnormal   Collection Time: 09/18/16  1:54 PM  Result Value Ref Range Status   Specimen Description URINE, RANDOM  Final   Special Requests NONE  Final   Culture MULTIPLE SPECIES PRESENT, SUGGEST RECOLLECTION (A)  Final   Report Status 09/19/2016 FINAL  Final  Culture, Urine     Status: None   Collection Time: 09/20/16 12:42 PM    Result Value Ref Range Status   Specimen Description URINE, RANDOM  Final   Special Requests NONE  Final   Culture NO GROWTH  Final   Report Status 09/21/2016 FINAL  Final         Radiology Studies: No results found.      Scheduled Meds: . aspirin EC  81 mg Oral QHS  . atorvastatin  20 mg  Oral q1800  . Chlorhexidine Gluconate Cloth  6 each Topical Daily  . feeding supplement (ENSURE ENLIVE)  237 mL Oral QID  . heparin  5,000 Units Subcutaneous Q8H  . lamoTRIgine  200 mg Oral Daily  . levothyroxine  75 mcg Intravenous Daily  . lurasidone  40 mg Oral q morning - 10a  . Melatonin  9 mg Oral QHS  . pantoprazole  40 mg Oral Daily  . QUEtiapine  150 mg Oral QHS  . sodium chloride flush  10-40 mL Intracatheter Q12H  . topiramate  50 mg Oral BID  . venlafaxine XR  150 mg Oral Q breakfast   Continuous Infusions: . sodium chloride 100 mL/hr at 09/21/16 1206  . cefTRIAXone (ROCEPHIN)  IV Stopped (09/21/16 2251)     LOS: 4 days    Time spent: 40 minutes    Jodelle Fausto, Geraldo Docker, MD [facility] Pager [phone]   If 7PM-7AM, please contact night-coverage [email or web address] Password [facility] 09/22/2016, 6:43 AM

## 2016-09-22 NOTE — Progress Notes (Signed)
Patient refuse nursing to perform fingerstick for CBG

## 2016-09-22 NOTE — Progress Notes (Signed)
Refuses all POCT tests

## 2016-09-23 LAB — CULTURE, BLOOD (ROUTINE X 2)
Culture: NO GROWTH
Culture: NO GROWTH
Special Requests: ADEQUATE
Special Requests: ADEQUATE

## 2016-09-23 LAB — GLUCOSE, CAPILLARY: Glucose-Capillary: 108 mg/dL — ABNORMAL HIGH (ref 65–99)

## 2016-09-23 NOTE — Progress Notes (Addendum)
4:30pm CSW received call back from [facility]- they are reviewing referral- main admissions person will be back tomorrow and will follow up with CSW  Spoke with pt dietician, Mickel Baas, and discussed pt situation and progress with referrals  2:20 CSW continuing to follow to assist with eating disorder treatment placement if possible  Left message for [facility]- still no response from referral that was sent last week  Received call back from [facility] [place] who states they have a residential program in [place] for adult women- left message for coordinator to discuss if pt is a candidate [phone]  CSW left message for pt dietician who is back in town to discuss progress  CSW will continue to follow  Jorge Ny, [place] Worker [phone]

## 2016-09-23 NOTE — Progress Notes (Addendum)
Pt. refusing some meds and Heparin injections; also refusing CBG checks.

## 2016-09-23 NOTE — Plan of Care (Signed)
Problem: Education: Goal: Knowledge of [facility] Education information/materials will improve Outcome: Progressing POC reviewed with pt.; refusing some meds and treaments.

## 2016-09-23 NOTE — Progress Notes (Signed)
Pharmacy Antibiotic Note  Brandi Love is a 41 y.o. female admitted on 09/18/2016 with pyelonephritis.  Pharmacy has been consulted for Ceftriaxone dosing.  Plan: Continue Ceftriaxone 1g IV q25 Today is day#6 of antibiotics, consider stopping- pt afebrile x several days and culture negative.  Height: 5\' 3"  (160 cm) Weight: 165 lb 2 oz (74.9 kg) IBW/kg (Calculated) : 52.4  Temp (24hrs), Avg:98.5 F (36.9 C), Min:98.4 F (36.9 C), Max:98.7 F (37.1 C)   Recent Labs Lab 09/18/16 1104 09/18/16 1129 09/18/16 1418 09/18/16 2225 09/19/16 0340 09/20/16 0637 09/20/16 0735 09/22/16 0632  WBC 12.4*  --   --   --  9.0 7.1  --  6.6  CREATININE 1.90*  --   --  1.66* 1.55* 1.50*  --  1.21*  LATICACIDVEN  --  2.69* 2.77* 2.6* 2.3*  --  1.0  --     Estimated Creatinine Clearance: 61.1 mL/min (A) (by C-G formula based on SCr of 1.21 mg/dL (H)).    Allergies  Allergen Reactions  . Dhea [Nutritional Supplements] Nausea And Vomiting  . Entex Lq [Phenylephrine-Guaifenesin] Shortness Of Breath and Swelling    Throat swelling  . Indomethacin Nausea And Vomiting  . Iodinated Diagnostic Agents Anaphylaxis    'cant breathe'  . Propoxyphene Anaphylaxis  . Risperidone And Related Other (See Comments)    Out of body experience  . Shellfish Allergy Other (See Comments)    unspecified  . Depakote [Divalproex Sodium] Nausea And Vomiting and Other (See Comments)    Weight gain  . Abilify [Aripiprazole] Nausea And Vomiting  . Dihydroergotamine Nausea And Vomiting  . Doxycycline Nausea And Vomiting  . Erythromycin Nausea And Vomiting  . Lactose Intolerance (Gi) Nausea And Vomiting  . Nsaids Other (See Comments)    Unable to ttake due to renal insufficency  . Tape Other (See Comments)    Redness, can use paper tape    Antimicrobials this admission: Vancomycin 5/31>> 6/1 Zosyn 5/31>> 6/1 Ceftriaxone 5/30 x1, resume 6/1 >>  Microbiology results: 5/30: Urine >> multiple species  present 5/30: Blood >> ngtd 6/1: Urine >> neg  Thank you for allowing pharmacy to be a part of this patient's care.   Lewie Chamber., PharmD Clinical Pharmacist [facility]

## 2016-09-23 NOTE — Progress Notes (Signed)
[place] TEAM 1 - Stepdown/ICU TEAM  Christianne Zacher Markham  IRW:[id number] DOB: 24-Jan-1976 DOA: 09/18/2016 PCP: Vickii Penna, MD    Brief Narrative:  41 y.o.F Hx Anxiety/Depression, Deafness w/ cochlear implants, CKD, DM, eating disorders, endometriosis, GERD, hyperlipidemia, hypertension, hypothyroidism, migraines, DVT/pulmonary embolus, and seizures who presented to the ED w/ suprapubic abdominal pain w/ dark malodorous urine.   Subjective: Patient is frequently refusing medications and refusing vitals/CBG checks intermittently.  She tells me the birth control anything she takes in by mouth "severely irritates my stomach and makes may need to vomit."  She explains that she can only tolerate ensure intermittently and that she is aware they are "sneaking medications into my ensure" and that when she takes the medication in her ensure she is spitting it out.  She tells me she would prefer to take the medicines by mouth rather than have the placed in her ensure thereby making it unfit for her to drink.  Assessment & Plan:  Sepsis due to Pyelonephritis No culture data to confirm pathogen - has completed 5 days of empiric tx - afebrile w/ normal WBC - stop abx and follow clinically - sepsis physiology has resolved  Hypotension BP is now in normal range   Depression / Eating disorder/ Acute Psychosis - Borderline Personality d/o -uncooperative with taking medication -per Psychiatry patient requires IVC - can get forced intramuscular medication and food as patient has limited insight and has continued to exhibit psychosis and severe depression  -goal is for transfer to [facility] - awaiting acceptance - medically stable for transfer when bed located    CKD Baseline crt is 1.9  Recent Labs Lab 09/18/16 1104 09/18/16 2225 09/19/16 0340 09/20/16 0637 09/22/16 7619  CREATININE 1.90* 1.66* 1.55* 1.50* 1.21*   Seizures Cont Lamictal  200 mg daily    Hypothyroidism cont IV synthroid   GERD continue protonix   Insomnia continue melatonin when pt will take   MRSA screen +  DVT prophylaxis: SQ heparin  Code Status: FULL CODE Family Communication: no family present at time of exam  Disposition Plan: awaiting bed at appropriated psychiatric treatment facility   Consultants:  Psychiatry   Procedures: none  Antimicrobials:  Ceftriaxone 5/30 > 6/4 Zosyn 5/31 > 6/1 Vancomycin 5/30 > 5/31  Objective: Blood pressure 123/85, pulse 91, temperature 99.1 F (37.3 C), temperature source Oral, resp. rate 18, height 5\' 3"  (1.6 m), weight 74.9 kg (165 lb 2 oz), SpO2 99 %.  Intake/Output Summary (Last 24 hours) at 09/23/16 1711 Last data filed at 09/23/16 1505  Gross per 24 hour  Intake          3161.67 ml  Output             2400 ml  Net           761.67 ml   Filed Weights   09/18/16 2302  Weight: 74.9 kg (165 lb 2 oz)    Examination: General: No acute respiratory distress Lungs: Clear to auscultation bilaterally without wheezes or crackles Cardiovascular: Regular rate and rhythm without murmur gallop or rub normal S1 and S2 Abdomen: Nontender, nondistended, soft, bowel sounds positive, no rebound, no ascites, no appreciable mass Extremities: No significant cyanosis, clubbing, or edema bilateral lower extremities  CBC:  Recent Labs Lab 09/18/16 1104 09/19/16 0340 09/20/16 0637 09/22/16 0632  WBC 12.4* 9.0 7.1 6.6  HGB 13.2 10.4* 10.4* 10.8*  HCT 38.8 31.7* 31.6* 32.1*  MCV 91.3 93.0 91.1 90.7  PLT 245 189 174 244   Basic Metabolic Panel:  Recent Labs Lab 09/18/16 1104 09/18/16 2225 09/19/16 0340 09/20/16 0637 09/22/16 0632  NA 134* 139 136 141 139  K 4.5 4.0 3.9 4.0 4.4  CL 101 108 108 110 109  CO2 21* 21* 21* 23 22  GLUCOSE 132* 126* 139* 127* 116*  BUN 17 14 14 16 16   CREATININE 1.90* 1.66* 1.55* 1.50* 1.21*  CALCIUM 9.4 8.5* 8.1* 9.0 10.2   GFR: Estimated Creatinine Clearance: 61.1 mL/min  (A) (by C-G formula based on SCr of 1.21 mg/dL (H)).  Liver Function Tests:  Recent Labs Lab 09/18/16 1104 09/18/16 2225 09/20/16 0637  AST 15 18 15   ALT 19 17 19   ALKPHOS 69 66 67  BILITOT 0.9 0.5 0.9  PROT 6.4* 6.0* 5.5*  ALBUMIN 3.4* 2.9* 2.4*    Recent Labs Lab 09/18/16 1104  LIPASE 37   Coagulation Profile:  Recent Labs Lab 09/18/16 2225  INR 1.13    Cardiac Enzymes:  Recent Labs Lab 09/19/16 0835  TROPONINI <0.03    HbA1C: Hgb A1c MFr Bld  Date/Time Value Ref Range Status  12/14/2012 02:49 AM 5.6 <5.7 % Final    Comment:    (NOTE)                                                                       According to the ADA Clinical Practice Recommendations for 2011, when HbA1c is used as a screening test:  >=6.5%   Diagnostic of Diabetes Mellitus           (if abnormal result is confirmed) 5.7-6.4%   Increased risk of developing Diabetes Mellitus References:Diagnosis and Classification of Diabetes Mellitus,Diabetes WNUU,7253,66(YQIHK 1):S62-S69 and Standards of Medical Care in         Diabetes - 2011,Diabetes VQQV,9563,87 (Suppl 1):S11-S61.    CBG:  Recent Labs Lab 09/20/16 1224 09/21/16 0911 09/21/16 1413 09/21/16 1805 09/21/16 2110  GLUCAP 130* 95 135* 106* 106*    Recent Results (from the past 240 hour(s))  Blood Culture (routine x 2)     Status: None   Collection Time: 09/18/16 12:02 PM  Result Value Ref Range Status   Specimen Description BLOOD RIGHT ANTECUBITAL  Final   Special Requests   Final    BOTTLES DRAWN AEROBIC AND ANAEROBIC Blood Culture adequate volume   Culture NO GROWTH 5 DAYS  Final   Report Status 09/23/2016 FINAL  Final  Blood Culture (routine x 2)     Status: None   Collection Time: 09/18/16 12:25 PM  Result Value Ref Range Status   Specimen Description BLOOD RIGHT HAND  Final   Special Requests IN PEDIATRIC BOTTLE Blood Culture adequate volume  Final   Culture NO GROWTH 5 DAYS  Final   Report Status 09/23/2016  FINAL  Final  Urine culture     Status: Abnormal   Collection Time: 09/18/16  1:54 PM  Result Value Ref Range Status   Specimen Description URINE, RANDOM  Final   Special Requests NONE  Final   Culture MULTIPLE SPECIES PRESENT, SUGGEST RECOLLECTION (A)  Final   Report Status 09/19/2016 FINAL  Final  Culture, Urine     Status: None   Collection Time: 09/20/16  12:42 PM  Result Value Ref Range Status   Specimen Description URINE, RANDOM  Final   Special Requests NONE  Final   Culture NO GROWTH  Final   Report Status 09/21/2016 FINAL  Final  MRSA PCR Screening     Status: Abnormal   Collection Time: 09/22/16  7:47 AM  Result Value Ref Range Status   MRSA by PCR POSITIVE (A) NEGATIVE Final    Comment:        The GeneXpert MRSA Assay (FDA approved for NASAL specimens only), is one component of a comprehensive MRSA colonization surveillance program. It is not intended to diagnose MRSA infection nor to guide or monitor treatment for MRSA infections. RESULT CALLED TO, READ BACK BY AND VERIFIED WITHCandie Chroman, RN AT [phone] 09/22/16 BY L BENFIELD      Scheduled Meds: . aspirin EC  81 mg Oral QHS  . atorvastatin  20 mg Oral q1800  . Chlorhexidine Gluconate Cloth  6 each Topical Daily  . feeding supplement (ENSURE ENLIVE)  237 mL Oral QID  . haloperidol lactate  2 mg Intravenous Q6H  . heparin  5,000 Units Subcutaneous Q8H  . lamoTRIgine  200 mg Oral Daily  . levothyroxine  75 mcg Intravenous Daily  . lurasidone  40 mg Oral q morning - 10a  . Melatonin  9 mg Oral QHS  . mupirocin ointment  1 application Nasal BID  . pantoprazole  40 mg Oral Daily  . QUEtiapine  150 mg Oral QHS  . sodium chloride flush  10-40 mL Intracatheter Q12H  . topiramate  50 mg Oral BID  . venlafaxine XR  150 mg Oral Q breakfast     LOS: 5 days   Cherene Altes, MD [facility] Office  [phone] Pager - Text Page per Amion as per below:  On-Call/Text Page:      [email or web address]      password  [email or web address]  If 7PM-7AM, please contact night-coverage [email or web address] Password [email or web address] 09/23/2016, 5:11 PM

## 2016-09-24 DIAGNOSIS — F321 Major depressive disorder, single episode, moderate: Secondary | ICD-10-CM

## 2016-09-24 MED ORDER — PROMETHAZINE HCL 25 MG RE SUPP: 25.0000 mg | Freq: Four times a day (QID) | RECTAL | Status: DC | PRN

## 2016-09-24 MED ORDER — ACETAMINOPHEN 325 MG PO TABS: 650.0000 mg | ORAL_TABLET | Freq: Four times a day (QID) | ORAL | Status: DC | PRN

## 2016-09-24 MED ORDER — LORAZEPAM 2 MG/ML IJ SOLN
1.0000 mg | Freq: Four times a day (QID) | INTRAMUSCULAR | Status: DC | PRN
  Administered 2016-09-24: 1 mg via INTRAVENOUS
  Filled 2016-09-24: qty 1

## 2016-09-24 MED ORDER — ACETAMINOPHEN 650 MG RE SUPP
650.0000 mg | Freq: Four times a day (QID) | RECTAL | Status: DC | PRN
  Administered 2016-09-24 – 2016-09-26 (×2): 650 mg via RECTAL
  Filled 2016-09-24 (×2): qty 1

## 2016-09-24 NOTE — Progress Notes (Addendum)
Pt woke up agitated, vomited in bed. When staff tried to clean her up, she tried to kick the staff. Yelling and screaming. Dr Sherral Hammers notified. 1200 Pt is calmer, agreed to take meds. More cooperative now. 1700 to remove central line today, pt refused. Message sent to Dr Sherral Hammers.

## 2016-09-24 NOTE — Progress Notes (Addendum)
12:30pm CSW received call back from [facility] [phone] Fax: [phone]  CSW faxed requested information to [facility] so they can initiate Single case agreement  11:30am Pt case discussed in [facility] [place] meeting this am- Medical director is looking into possible options for this patient.  CSW continuing to follow for placement needs  [facility] [place] [phone]) called CSW back and informed they can do single case agreements- pt outpatient providers continue to work on getting single case agreements through [facility]- no updates at this time.   CSW also discussed possible IVC with MD due to patient's agitation- since patient is not attempting to leave the hospital at this time there is no need for IVC and this might complicate placement in eating disorder treatment  CSW called main [facility]'s line to discuss pt single case agreement application since CSW has not heard back from multiple voicemails left for [facility]- Single Case Agreement coordinator Ubaldo Glassing will reach our to [employer] and provide with CSW information.  CSW received call back from [facility] eating disorder clinic- they will not have final determination on patient until tomorrow mid-day but they state they are likely to have beds available soon  CSW will continue to follow  Jorge Ny, [place] Social Worker [phone]

## 2016-09-24 NOTE — Progress Notes (Signed)
PROGRESS NOTE    Brandi Love  BHA:[id number] DOB: [birth year] DOA: 09/18/2016 PCP: Vickii Penna, MD   Brief Narrative:  41 y.o.WF PMHx Anxiety/Depression, Deaf(cochlear implants), CKD, diabetes, eating disorders, endometriosis, GERD, hyperlipidemia, hypertension, hypothyroidism, migraines, DVT/pulmonary embolus, seizures. Patient attended to at time of presentation by her in-home health care aide. Limited history provided by patient due to acuity of illness, difficulty hearing and patient not wanting to participate in exam. Patient with very little oral intake over the last 2 days. Intermittent dry cough over the last days. Suprapubic abdominal pain consistent with the patient describes as her UTI symptoms. Urine is become very dark, odorous and quality. Denies any fevers, chest pain, palpitations, diarrhea, focal neurological deficit, headache. Does endorse bilateral flank pain. No further history able to be obtained.    Subjective: 6/5  A/O 4, patient now interacting with staff. States having headache agrees to Tylenol and request that it be administered PR. Inquired into whether she would be discharged to [facility] eating disorder or IVC, and how long it would be prior to her discharge. Follows all commands, laughing and interacting with staff appropriately.        Assessment & Plan:   Principal Problem:   Eating disorder, unspecified Active Problems:   Borderline personality disorder   CKD (chronic kidney disease) stage 3, GFR 30-59 ml/min   Hypothyroidism   MDD (major depressive disorder), recurrent, severe, with psychosis ([place])   Sepsis ([place])   Acute pyelonephritis   Sepsis due to unspecified organism: Pyelonephritis    -Normal saline 75 ml/hr -Blood culture NGTD -6/1 urine culture negative -Completed course Ceftriaxone for pyelonephritis  Hypotension  -Resolved   Tachycardia  -Resolved  Depression/Eating disorder/ Acute Psychosis/HA: -Currently patient  partially uncooperative with taking medication. On 6/2 reconsulted Psychiatry appears will require IVC -Seroquel on hold patient refuses all oral medication  -Latuda 40 mg daily Crush tablet and stir into the patients ensure -Haldol 5 mg daily PO ---> 2 mg IV TID - Effexor on hold patient refuses all oral medication  -Topamax, Imitrex:on hold patient refuses all oral medication  -6/3 reconsulted psychiatry secondary to patient's continued psychosis. Continues to refuse oral medication, refuses to communicate with staff and at times will become extremely agitated. -Per Psychiatry concur that patient requires IVC. In addition per their recommendation concur can get forced intramuscular medication and food as patient has limited insight and she continued to exhibit psychosis and severe depression.   -On 6/5 spoke with LCSW Jenna patient's psychosis appears to be resolving may now be appropriate for [facility] eating disorder inpatient program. They are supposed to give [place] an answer on 6/6 concerning acceptance  CKD: Cr 1.9. At [facility] Lab Results  Component Value Date   CREATININE 1.21 (H) 09/22/2016   CREATININE 1.50 (H) 09/20/2016   CREATININE 1.55 (H) 09/19/2016  -At baseline   Seizures: -Lamictal  200 mg daily: Crush tablet and stir into the patients ensure  Hypothyroidism,: -Synthroid PO---> Synthroid 75 g IV daily  Hypertension: -Hold metoprolol due to hypotension  Chronic pain: -continue Flexeril if patient will take PO meds  Hyperlipidemia: -continue Lipitor  GERD: -continue protonix  Insomnia -continue melatonoin  Prediabetes:  -Diet controlled. -RN obtain CBG before meals at bedtime from CVL   DVT prophylaxis: Subcutaneous heparin Code Status: Full Family Communication: None Disposition Plan: On 6/5 Awaiting word from [facility] on acceptance to eating disorder inpatient program     Consultants:  Psychiatry Dr.Arfeen, Dossie Der  Procedures/Significant Events:  NA   VENTILATOR SETTINGS: NA   Cultures 5/30 Blood culture Rigt AC;  NGTD 5/30 Blood culture Right hand: NGTD 5/30 urine positive multiple species 6/1 urine negative    Antimicrobials:Anti-infectives    Start     Dose/Rate Stop   09/20/16 2200  cefTRIAXone (ROCEPHIN) 1 g in dextrose 5 % 50 mL IVPB  Status:  Discontinued     1 g 100 mL/hr over 30 Minutes 09/23/16 1720   09/19/16 1200  cefTRIAXone (ROCEPHIN) 1 g in dextrose 5 % 50 mL IVPB  Status:  Discontinued     1 g 100 mL/hr over 30 Minutes 09/19/16 0450   09/19/16 0600  vancomycin (VANCOCIN) 1,250 mg in sodium chloride 0.9 % 250 mL IVPB  Status:  Discontinued     1,250 mg 166.7 mL/hr over 90 Minutes 09/20/16 1820   09/19/16 0600  piperacillin-tazobactam (ZOSYN) IVPB 3.375 g  Status:  Discontinued     3.375 g 12.5 mL/hr over 240 Minutes 09/20/16 1820   09/18/16 1200  cefTRIAXone (ROCEPHIN) 2 g in dextrose 5 % 50 mL IVPB     2 g 100 mL/hr over 30 Minutes 09/18/16 1304       Devices NA   LINES / TUBES:  NA    Continuous Infusions: . sodium chloride 75 mL/hr at 09/24/16 0233     Objective: Vitals:   09/23/16 0611 09/23/16 1505 09/23/16 2126 09/24/16 0533  BP: 109/64 123/85 (!) 128/94 (!) 128/95  Pulse: 82 91 91 95  Resp:  18  18  Temp:  99.1 F (37.3 C) 99 F (37.2 C) 99.2 F (37.3 C)  TempSrc:  Oral Oral Oral  SpO2:  99% 99% 99%  Weight:      Height:        Intake/Output Summary (Last 24 hours) at 09/24/16 1017 Last data filed at 09/24/16 0534  Gross per 24 hour  Intake             2370 ml  Output             3650 ml  Net            -1280 ml   Filed Weights   09/18/16 2302  Weight: 165 lb 2 oz (74.9 kg)    Examination:  General: A/O 4, patient put on her cochlear implants when we entered the room, answered all questions appropriately follow all commands, interactive with staff appropriately, No acute respiratory distress Eyes: negative scleral  hemorrhage, negative anisocoria, negative icterus ENT: Negative Runny nose, negative gingival bleeding, Neck:  Negative scars, masses, torticollis, lymphadenopathy, JVD, right IJ triple-lumen present negative sign of infection Lungs: Clear to auscultation bilaterally without wheezes or crackles Cardiovascular: Regular rate and rhythm without murmur gallop or rub normal S1 and S2 Abdomen: Negative pain to palpation, no rebound, no ascites, no appreciable mass Extremities: No significant cyanosis, clubbing, or edema bilateral lower extremities Skin: Negative rashes, lesions, ulcers Psychiatric:  Unable to evaluate. Patient not cooperative.  Central nervous system:  Cranial nerves II through XII intact, tongue/uvula midline, all extremities muscle strength 5/5, sensation intact throughout, negative dysarthria, negative expressive aphasia, negative receptive aphasia.   .     Data Reviewed: Care during the described time interval was provided by me .  I have reviewed this patient's available data, including medical history, events of note, physical examination, and all test results as part of my evaluation. I have personally reviewed and interpreted all radiology studies.  CBC:  Recent Labs Lab 09/18/16 1104 09/19/16 0340 09/20/16 0637 09/22/16 0632  WBC 12.4* 9.0 7.1 6.6  HGB 13.2 10.4* 10.4* 10.8*  HCT 38.8 31.7* 31.6* 32.1*  MCV 91.3 93.0 91.1 90.7  PLT 245 189 174 829   Basic Metabolic Panel:  Recent Labs Lab 09/18/16 1104 09/18/16 2225 09/19/16 0340 09/20/16 0637 09/22/16 0632  NA 134* 139 136 141 139  K 4.5 4.0 3.9 4.0 4.4  CL 101 108 108 110 109  CO2 21* 21* 21* 23 22  GLUCOSE 132* 126* 139* 127* 116*  BUN 17 14 14 16 16   CREATININE 1.90* 1.66* 1.55* 1.50* 1.21*  CALCIUM 9.4 8.5* 8.1* 9.0 10.2   GFR: Estimated Creatinine Clearance: 61.1 mL/min (A) (by C-G formula based on SCr of 1.21 mg/dL (H)). Liver Function Tests:  Recent Labs Lab 09/18/16 1104  09/18/16 2225 09/20/16 0637  AST 15 18 15   ALT 19 17 19   ALKPHOS 69 66 67  BILITOT 0.9 0.5 0.9  PROT 6.4* 6.0* 5.5*  ALBUMIN 3.4* 2.9* 2.4*    Recent Labs Lab 09/18/16 1104  LIPASE 37   No results for input(s): AMMONIA in the last 168 hours. Coagulation Profile:  Recent Labs Lab 09/18/16 2225  INR 1.13   Cardiac Enzymes:  Recent Labs Lab 09/19/16 0835  TROPONINI <0.03   BNP (last 3 results) No results for input(s): PROBNP in the last 8760 hours. HbA1C: No results for input(s): HGBA1C in the last 72 hours. CBG:  Recent Labs Lab 09/21/16 0911 09/21/16 1413 09/21/16 1805 09/21/16 2110 09/23/16 2136  GLUCAP 95 135* 106* 106* 108*   Lipid Profile: No results for input(s): CHOL, HDL, LDLCALC, TRIG, CHOLHDL, LDLDIRECT in the last 72 hours. Thyroid Function Tests: No results for input(s): TSH, T4TOTAL, FREET4, T3FREE, THYROIDAB in the last 72 hours. Anemia Panel: No results for input(s): VITAMINB12, FOLATE, FERRITIN, TIBC, IRON, RETICCTPCT in the last 72 hours. Urine analysis:    Component Value Date/Time   COLORURINE YELLOW 09/18/2016 1354   APPEARANCEUR CLOUDY (A) 09/18/2016 1354   LABSPEC 1.011 09/18/2016 1354   PHURINE 5.0 09/18/2016 1354   GLUCOSEU NEGATIVE 09/18/2016 1354   HGBUR MODERATE (A) 09/18/2016 1354   BILIRUBINUR NEGATIVE 09/18/2016 1354   KETONESUR NEGATIVE 09/18/2016 1354   PROTEINUR 30 (A) 09/18/2016 1354   UROBILINOGEN 0.2 12/14/2012 0816   NITRITE POSITIVE (A) 09/18/2016 1354   LEUKOCYTESUR LARGE (A) 09/18/2016 1354   Sepsis Labs: @LABRCNTIP (procalcitonin:4,lacticidven:4)  ) Recent Results (from the past 240 hour(s))  Blood Culture (routine x 2)     Status: None   Collection Time: 09/18/16 12:02 PM  Result Value Ref Range Status   Specimen Description BLOOD RIGHT ANTECUBITAL  Final   Special Requests   Final    BOTTLES DRAWN AEROBIC AND ANAEROBIC Blood Culture adequate volume   Culture NO GROWTH 5 DAYS  Final   Report Status  09/23/2016 FINAL  Final  Blood Culture (routine x 2)     Status: None   Collection Time: 09/18/16 12:25 PM  Result Value Ref Range Status   Specimen Description BLOOD RIGHT HAND  Final   Special Requests IN PEDIATRIC BOTTLE Blood Culture adequate volume  Final   Culture NO GROWTH 5 DAYS  Final   Report Status 09/23/2016 FINAL  Final  Urine culture     Status: Abnormal   Collection Time: 09/18/16  1:54 PM  Result Value Ref Range Status   Specimen Description URINE, RANDOM  Final   Special Requests NONE  Final  Culture MULTIPLE SPECIES PRESENT, SUGGEST RECOLLECTION (A)  Final   Report Status 09/19/2016 FINAL  Final  Culture, Urine     Status: None   Collection Time: 09/20/16 12:42 PM  Result Value Ref Range Status   Specimen Description URINE, RANDOM  Final   Special Requests NONE  Final   Culture NO GROWTH  Final   Report Status 09/21/2016 FINAL  Final  MRSA PCR Screening     Status: Abnormal   Collection Time: 09/22/16  7:47 AM  Result Value Ref Range Status   MRSA by PCR POSITIVE (A) NEGATIVE Final    Comment:        The GeneXpert MRSA Assay (FDA approved for NASAL specimens only), is one component of a comprehensive MRSA colonization surveillance program. It is not intended to diagnose MRSA infection nor to guide or monitor treatment for MRSA infections. RESULT CALLED TO, READ BACK BY AND VERIFIED WITH: Candie Chroman, RN AT [phone] 09/22/16 BY L BENFIELD          Radiology Studies: No results found.      Scheduled Meds: . aspirin EC  81 mg Oral QHS  . atorvastatin  20 mg Oral q1800  . Chlorhexidine Gluconate Cloth  6 each Topical Daily  . feeding supplement (ENSURE ENLIVE)  237 mL Oral QID  . haloperidol lactate  2 mg Intravenous Q6H  . heparin  5,000 Units Subcutaneous Q8H  . lamoTRIgine  200 mg Oral Daily  . levothyroxine  75 mcg Intravenous Daily  . lurasidone  40 mg Oral q morning - 10a  . Melatonin  9 mg Oral QHS  . mupirocin ointment  1 application Nasal  BID  . pantoprazole  40 mg Oral Daily  . QUEtiapine  150 mg Oral QHS  . sodium chloride flush  10-40 mL Intracatheter Q12H  . topiramate  50 mg Oral BID  . venlafaxine XR  150 mg Oral Q breakfast   Continuous Infusions: . sodium chloride 75 mL/hr at 09/24/16 0233     LOS: 6 days    Time spent: 40 minutes    WOODS, Geraldo Docker, MD [facility] Pager [phone]   If 7PM-7AM, please contact night-coverage [email or web address] Password [email or web address] 09/24/2016, 8:28 AM

## 2016-09-25 MED ORDER — SODIUM CHLORIDE 0.9 % IV SOLN
25.0000 mg | Freq: Once | INTRAVENOUS | Status: DC
Start: 2016-09-25 — End: 2016-09-25
  Filled 2016-09-25: qty 0.5

## 2016-09-25 MED ORDER — HALOPERIDOL LACTATE 5 MG/ML IJ SOLN: 2.0000 mg | Freq: Four times a day (QID) | INTRAMUSCULAR | Status: DC | PRN

## 2016-09-25 MED ORDER — DIPHENHYDRAMINE HCL 50 MG/ML IJ SOLN: 12.5000 mg | Freq: Four times a day (QID) | INTRAMUSCULAR | Status: DC | PRN

## 2016-09-25 MED ORDER — DIPHENHYDRAMINE HCL 50 MG/ML IJ SOLN
25.0000 mg | Freq: Once | INTRAMUSCULAR | Status: AC
  Administered 2016-09-25: 25 mg via INTRAVENOUS
  Filled 2016-09-25: qty 1

## 2016-09-25 MED ORDER — LEVOTHYROXINE SODIUM 75 MCG PO TABS
150.0000 ug | ORAL_TABLET | Freq: Every day | ORAL | Status: DC
  Administered 2016-09-26 – 2016-09-27 (×2): 150 ug via ORAL
  Filled 2016-09-25 (×2): qty 2

## 2016-09-25 NOTE — Progress Notes (Signed)
Patient instructed that there was an order from MD for her central line to be discontinued. Patient stated, "no, I'm a hard stick. Pull it when I leave please." Rn explained importance of central line removal to patient, she verbalized understanding but continued to refuse for IV team to d/c central line.

## 2016-09-25 NOTE — Progress Notes (Signed)
Wrong patient

## 2016-09-25 NOTE — Progress Notes (Signed)
Patient cried out and scratching all over her body after a few minutes coming out from the bathroom to void with the CNA assistance.  Assessed patient's body but could not find any rashes but patient continued to scratch kicking her legs on the bed.  Gave her the PRN Benadryl 25 mg capsule but patient refused and stated it does not work fast.  Paged on-call [facility] MD and Dr. Jackqulyn Livings responded an ordered Benadryl 25 mg IV.  Administered one time Benadryl 25 mg IV as ordered and noted effective.  Patient now resting on bed comfortably with both eyes closed.

## 2016-09-25 NOTE — Progress Notes (Signed)
Nutrition Follow-up  DOCUMENTATION CODES:   Not applicable  INTERVENTION:   -Continue Ensure Enlive po QID, each supplement provides 350 kcal and 20 grams of protein  NUTRITION DIAGNOSIS:   Inadequate oral intake related to chronic illness (ED) as evidenced by meal completion < 25%, per patient/family report.  Ongoing  GOAL:   Patient will meet greater than or equal to 90% of their needs  Progressing  MONITOR:   PO intake, Supplement acceptance, Labs, Weight trends, Skin, I & O's  REASON FOR ASSESSMENT:   Malnutrition Screening Tool    ASSESSMENT:   Brandi Love is a 41 y.o. female with medical history significant of anxiety/depression, deafness, C KD, diabetes, eating disorders, endometriosis, GERD, hyperlipidemia, hypertension, hypothyroidism, migraines, DVT/pulmonary embolus, seizures. Patient attended to at time of presentation by her in-home health care aide. Limited history provided by patient due to acuity of illness, difficulty hearing and patient not wanting to participate in exam. Patient with very little oral intake over the last 2 days.  6/3- transferred from SDU to surgical floor  Pt sleeping soundly at time of visit. RD did not disturb.   Pt continues to refuse food, but is consuming Ensure Enlive. Pt was intermittently refusing, related to nausea and medications being mixed into the Ensure. Reviewed MAR records since last visit- pt is consuming approximately 980 kcals and 56 grams protein daily (meeting 59% of estimated kcal needs and 70% of estimated protein needs) over the past 5 days. Intake of Ensure appears to be increasing.   Psychiatry and CSW following; working on placement for eating disorder treatment program.   Labs reviewed: CBGS: 106-108.   Diet Order:  Diet regular Room service appropriate? Yes; Fluid consistency: Thin  Skin:  Reviewed, no issues  Last BM:  09/21/16  Height:   Ht Readings from Last 1 Encounters:  09/18/16 5\' 3"  (1.6 m)     Weight:   Wt Readings from Last 1 Encounters:  09/18/16 165 lb 2 oz (74.9 kg)    Ideal Body Weight:  52.3 kg  BMI:  Body mass index is 29.25 kg/m.  Estimated Nutritional Needs:   Kcal:  1650-1850  Protein:  80-95 grams  Fluid:  >1.6 L  EDUCATION NEEDS:   Education needs addressed  Gwenlyn Hottinger A. Jimmye Norman, RD, LDN, CDE Pager: [phone] After hours Pager: [phone]

## 2016-09-25 NOTE — Progress Notes (Addendum)
3:10pm [facility] in [place] unable to accept patient- cannot do single case agreement with [place] Medicaid  Spoke with Amber at [facility]- they also believe they cannot do single case agreement with [place] Medicaid- is checking with director and will get back with CSW  12:45pm CSW continuing to work on placement for patient.   [facility] requires information about the admitting program before they will approve single case agreement- CSW contacted Tsos[facility] Billing in [place] to make referral- left message for admissions  Referral faxed to [facility] in [place]- they are reviewing  CSW spoke with [facility]- still no determination but intake coordinator does not think pt is appropriate due to weight- states their program focuses on weight restoration and pt is still above ideal body weight despite rapid weight loss  [facility]-can accept single case agreement- completed phone pre-screen for patient- they will review- file number is [id number]- Direct admissions number is [phone] (press option for "receptionist")  CSW will continue to follow  Jorge Ny, [place] Social Worker [phone]

## 2016-09-25 NOTE — Progress Notes (Signed)
[place] TEAM 1 - Stepdown/ICU TEAM  Brandi Love  IEP:[id number] DOB: Jun 30, 1975 DOA: 09/18/2016 PCP: Vickii Penna, MD    Brief Narrative:  41 y.o.F Hx Anxiety/Depression, Deafness w/ cochlear implants, CKD, DM, eating disorder, endometriosis, GERD, hyperlipidemia, hypertension, hypothyroidism, migraines, DVT/pulmonary embolus, and seizures who presented to the ED w/ suprapubic abdominal pain w/ dark malodorous urine.   Subjective: The pt has refused to allow her CVL to be discontinued.  She reports ongoing nausea and discomfort w/ attempts to eat, but states that she is taking all of her medications as requested.  She denies cp, sob, or HA.  She is willing to be transferred voluntarily to [facility] Disorder inpatient tx center.      Assessment & Plan:  Sepsis due to Pyelonephritis No culture data to confirm pathogen - has completed 5 days of empiric tx - afebrile w/ normal WBC - stop abx and follow clinically - sepsis physiology has resolved  Hypotension Due to simple DH - resolved w/ IVF   CVL I have explained to the pt that there is a risk of infection w/ continuation of her CVL - I have compromised w/ her and agreed to leave the CVL in place for today - she is aware that if [facility] has a bed for her it will need to be d/c'ed today prior to her d/c - if she is not transferred today, I have informed her that the CVL will HAVE to be removed regardless tomorrow   Depression / Eating disorder/ Acute Psychosis - Borderline Personality d/o -uncooperative with taking medication -per Psychiatry patient requires IVC - can get forced intramuscular medication and food as patient has limited insight and has continued to exhibit psychosis and severe depression  -goal is for transfer to [place] [facility] treatment center - awaiting acceptance - medically stable for transfer when bed located   -current sx of abdom pain and nausea leading to virtually no oral intake appears c/w her chronic eating  disorder manifestation - for his reason I will not pursue an extensive GI eval at this time - she is known the to the eating disorder clinic at [facility] where she is to be dispositioned - will leave decision on need for further eval to this clinic   CKD Baseline crt is 1.9 - stable   Recent Labs Lab 09/18/16 2225 09/19/16 0340 09/20/16 0637 09/22/16 6606  CREATININE 1.66* 1.55* 1.50* 1.21*   Seizures Cont Lamictal  200 mg daily   Hypothyroidism cont synthroid   GERD continue protonix   Insomnia continue melatonin when pt will take   MRSA screen +  DVT prophylaxis: SQ heparin  Code Status: FULL CODE Family Communication: no family present at time of exam  Disposition Plan: awaiting bed at appropriated psychiatric treatment facility   Consultants:  Psychiatry   Procedures: none  Antimicrobials:  Ceftriaxone 5/30 > 6/4 Zosyn 5/31 > 6/1 Vancomycin 5/30 > 5/31  Objective: Blood pressure 119/77, pulse 90, temperature 99.2 F (37.3 C), resp. rate 16, height 5\' 3"  (1.6 m), weight 74.9 kg (165 lb 2 oz), SpO2 96 %.  Intake/Output Summary (Last 24 hours) at 09/25/16 1147 Last data filed at 09/25/16 0820  Gross per 24 hour  Intake             2370 ml  Output             2200 ml  Net  170 ml   Filed Weights   09/18/16 2302  Weight: 74.9 kg (165 lb 2 oz)    Examination: General: No acute respiratory distress Lungs: CTA B w/o wheeze  Cardiovascular: RRR w/o M  Abdomen: mildly tender diffusely - no mass, BS+, non-distended, no rebound Extremities: No significant edema bilateral lower extremities  CBC:  Recent Labs Lab 09/19/16 0340 09/20/16 0637 09/22/16 0632  WBC 9.0 7.1 6.6  HGB 10.4* 10.4* 10.8*  HCT 31.7* 31.6* 32.1*  MCV 93.0 91.1 90.7  PLT 189 174 825   Basic Metabolic Panel:  Recent Labs Lab 09/18/16 2225 09/19/16 0340 09/20/16 0637 09/22/16 0632  NA 139 136 141 139  K 4.0 3.9 4.0 4.4  CL 108 108 110 109  CO2 21* 21* 23 22    GLUCOSE 126* 139* 127* 116*  BUN 14 14 16 16   CREATININE 1.66* 1.55* 1.50* 1.21*  CALCIUM 8.5* 8.1* 9.0 10.2   GFR: Estimated Creatinine Clearance: 61.1 mL/min (A) (by C-G formula based on SCr of 1.21 mg/dL (H)).  Liver Function Tests:  Recent Labs Lab 09/18/16 2225 09/20/16 0637  AST 18 15  ALT 17 19  ALKPHOS 66 67  BILITOT 0.5 0.9  PROT 6.0* 5.5*  ALBUMIN 2.9* 2.4*   Coagulation Profile:  Recent Labs Lab 09/18/16 2225  INR 1.13    Cardiac Enzymes:  Recent Labs Lab 09/19/16 0835  TROPONINI <0.03    HbA1C: Hgb A1c MFr Bld  Date/Time Value Ref Range Status  12/14/2012 02:49 AM 5.6 <5.7 % Final    Comment:    (NOTE)                                                                       According to the ADA Clinical Practice Recommendations for 2011, when HbA1c is used as a screening test:  >=6.5%   Diagnostic of Diabetes Mellitus           (if abnormal result is confirmed) 5.7-6.4%   Increased risk of developing Diabetes Mellitus References:Diagnosis and Classification of Diabetes Mellitus,Diabetes KNLZ,7673,41(PFXTK 1):S62-S69 and Standards of Medical Care in         Diabetes - 2011,Diabetes WIOX,7353,29 (Suppl 1):S11-S61.    CBG:  Recent Labs Lab 09/21/16 0911 09/21/16 1413 09/21/16 1805 09/21/16 2110 09/23/16 2136  GLUCAP 95 135* 106* 106* 108*    Recent Results (from the past 240 hour(s))  Blood Culture (routine x 2)     Status: None   Collection Time: 09/18/16 12:02 PM  Result Value Ref Range Status   Specimen Description BLOOD RIGHT ANTECUBITAL  Final   Special Requests   Final    BOTTLES DRAWN AEROBIC AND ANAEROBIC Blood Culture adequate volume   Culture NO GROWTH 5 DAYS  Final   Report Status 09/23/2016 FINAL  Final  Blood Culture (routine x 2)     Status: None   Collection Time: 09/18/16 12:25 PM  Result Value Ref Range Status   Specimen Description BLOOD RIGHT HAND  Final   Special Requests IN PEDIATRIC BOTTLE Blood Culture  adequate volume  Final   Culture NO GROWTH 5 DAYS  Final   Report Status 09/23/2016 FINAL  Final  Urine culture     Status: Abnormal  Collection Time: 09/18/16  1:54 PM  Result Value Ref Range Status   Specimen Description URINE, RANDOM  Final   Special Requests NONE  Final   Culture MULTIPLE SPECIES PRESENT, SUGGEST RECOLLECTION (A)  Final   Report Status 09/19/2016 FINAL  Final  Culture, Urine     Status: None   Collection Time: 09/20/16 12:42 PM  Result Value Ref Range Status   Specimen Description URINE, RANDOM  Final   Special Requests NONE  Final   Culture NO GROWTH  Final   Report Status 09/21/2016 FINAL  Final  MRSA PCR Screening     Status: Abnormal   Collection Time: 09/22/16  7:47 AM  Result Value Ref Range Status   MRSA by PCR POSITIVE (A) NEGATIVE Final    Comment:        The GeneXpert MRSA Assay (FDA approved for NASAL specimens only), is one component of a comprehensive MRSA colonization surveillance program. It is not intended to diagnose MRSA infection nor to guide or monitor treatment for MRSA infections. RESULT CALLED TO, READ BACK BY AND VERIFIED WITHCandie Chroman, RN AT [phone] 09/22/16 BY L BENFIELD      Scheduled Meds: . aspirin EC  81 mg Oral QHS  . atorvastatin  20 mg Oral q1800  . Chlorhexidine Gluconate Cloth  6 each Topical Daily  . feeding supplement (ENSURE ENLIVE)  237 mL Oral QID  . haloperidol lactate  2 mg Intravenous Q6H  . heparin  5,000 Units Subcutaneous Q8H  . lamoTRIgine  200 mg Oral Daily  . levothyroxine  75 mcg Intravenous Daily  . lurasidone  40 mg Oral q morning - 10a  . Melatonin  9 mg Oral QHS  . mupirocin ointment  1 application Nasal BID  . pantoprazole  40 mg Oral Daily  . QUEtiapine  150 mg Oral QHS  . sodium chloride flush  10-40 mL Intracatheter Q12H  . topiramate  50 mg Oral BID  . venlafaxine XR  150 mg Oral Q breakfast     LOS: 7 days   Cherene Altes, MD [facility] Office  [phone] Pager -  Text Page per Amion as per below:  On-Call/Text Page:      [email or web address]      password [email or web address]  If 7PM-7AM, please contact night-coverage [email or web address] Password [email or web address] 09/25/2016, 11:47 AM

## 2016-09-26 ENCOUNTER — Encounter: Payer: Self-pay | Admitting: *Deleted

## 2016-09-26 MED ORDER — DIPHENHYDRAMINE HCL 25 MG PO CAPS
25.0000 mg | ORAL_CAPSULE | ORAL | Status: DC | PRN
  Administered 2016-09-26 (×3): 50 mg via ORAL
  Filled 2016-09-26 (×3): qty 2

## 2016-09-26 NOTE — Progress Notes (Signed)
Patient refused blood draw for AM labs.

## 2016-09-26 NOTE — Progress Notes (Signed)
PROGRESS NOTE    Brandi Love  CXK:[id number] DOB: [birth year] DOA: 09/18/2016 PCP: Vickii Penna, MD   Brief Narrative: 41 y.o.FHx Anxiety/Depression, Deafness w/ cochlear implants, CKD, DM, eating disorder, endometriosis, GERD, hyperlipidemia, hypertension, hypothyroidism, migraines, DVT/pulmonary embolus, and seizures who presented to the ED w/ suprapubic abdominal pain w/ dark malodorous urine.   Patient was completed antibiotics. Seen by psychiatrist. Safe discharge planning on going. Patient was transferred to my care on 09/26/2016.  Assessment & Plan:  # Sepsis due to pyelonephritis: Urine culture likely contaminated. Patient completed 5 days of empiric antibiotics and clinically improved. Blood cultures negative.  #Hypotension: Due to dehydration. Improved with IV fluid. Continue to monitor blood pressure.  #Depression/eating disorder/acute psychosis/borderline personality disorder: -Evaluated by psychiatrist. Patient has been uncooperative with medications and management. The last psychiatrist note from 09/22/2016 by Dr. Adele Schilder, recommended transferring to eating disorder treatment program at [facility]. She also recommended that "Due to her limited insight and judgment and episodes of agitation she can be placed on IVC." -I discussed with the social worker today, no bed available at [facility]. Outpatient she had discharge planning on going.  -Patient was pleasant, no suicidal or homicidal this morning. I re-consulted psychiatrist and discussed with Dr. Louretta Shorten. -Continue Lamictal, latuda, Seroquel, Topamax and Effexor. Patient has been refusing medication occasionally. Continue supportive care.  # Seizure disorder: Currently on Lamictal.   # hypothyroidism: Continue Synthroid  #GERD: Continue Protonix  # Insomnia: Continue melatonin  # CKD stage 3: monitor BMP. Stable now.  #Deafness: Patient uses her cochlear implant.  Principal Problem:   Eating  disorder, unspecified Active Problems:   Borderline personality disorder   CKD (chronic kidney disease) stage 3, GFR 30-59 ml/min   Hypothyroidism   MDD (major depressive disorder), recurrent, severe, with psychosis ([place])   Sepsis ([place])   Acute pyelonephritis  DVT prophylaxis:Heparin subcutaneous Code Status: full code  Family Communication: no family at bedside  Disposition Plan: likely inpatient psych when bed is available. Waiting for psychiatrist reevaluation. Social worker eval is ongoing for safe discharge planning.     Consultants:    psychiatrist   Procedures: none  Antimicrobials: Ceftriaxone 5/30 > 6/4 Zosyn 5/31 > 6/1 Vancomycin 5/30 > 5/31 Subjective:   Objective: Vitals:   09/25/16 1454 09/25/16 2138 09/25/16 2205 09/26/16 0608  BP: 120/82 (!) 129/91  128/85  Pulse: 98 (!) 102  95  Resp: 18 16  16   Temp: 99 F (37.2 C) 98.5 F (36.9 C) 99 F (37.2 C) 98.8 F (37.1 C)  TempSrc: Oral Oral Oral Oral  SpO2: 99% 100%  98%  Weight:      Height:        Intake/Output Summary (Last 24 hours) at 09/26/16 1049 Last data filed at 09/26/16 1003  Gross per 24 hour  Intake             1440 ml  Output             1300 ml  Net              140 ml   Filed Weights   09/18/16 2302  Weight: 74.9 kg (165 lb 2 oz)    Examination:  General exam: Appears calm and comfortable  Respiratory system: Clear to auscultation. Respiratory effort normal. No wheezing or crackle Cardiovascular system: S1 & S2 heard, RRR.  No pedal edema. Gastrointestinal system: Abdomen is nondistended, soft and nontender. Normal bowel sounds heard. Central nervous system: Alert  and oriented. No focal neurological deficits. Extremities: Symmetric 5 x 5 power. Skin: No rashes, lesions or ulcers Psychiatry: Judgement and insight appear normal. Mood & affect appropriate.     Data Reviewed: I have personally reviewed following labs and imaging studies  CBC:  Recent Labs Lab  09/20/16 0637 09/22/16 0632  WBC 7.1 6.6  HGB 10.4* 10.8*  HCT 31.6* 32.1*  MCV 91.1 90.7  PLT 174 101   Basic Metabolic Panel:  Recent Labs Lab 09/20/16 0637 09/22/16 0632  NA 141 139  K 4.0 4.4  CL 110 109  CO2 23 22  GLUCOSE 127* 116*  BUN 16 16  CREATININE 1.50* 1.21*  CALCIUM 9.0 10.2   GFR: Estimated Creatinine Clearance: 61.1 mL/min (A) (by C-G formula based on SCr of 1.21 mg/dL (H)). Liver Function Tests:  Recent Labs Lab 09/20/16 0637  AST 15  ALT 19  ALKPHOS 67  BILITOT 0.9  PROT 5.5*  ALBUMIN 2.4*   No results for input(s): LIPASE, AMYLASE in the last 168 hours. No results for input(s): AMMONIA in the last 168 hours. Coagulation Profile: No results for input(s): INR, PROTIME in the last 168 hours. Cardiac Enzymes: No results for input(s): CKTOTAL, CKMB, CKMBINDEX, TROPONINI in the last 168 hours. BNP (last 3 results) No results for input(s): PROBNP in the last 8760 hours. HbA1C: No results for input(s): HGBA1C in the last 72 hours. CBG:  Recent Labs Lab 09/21/16 0911 09/21/16 1413 09/21/16 1805 09/21/16 2110 09/23/16 2136  GLUCAP 95 135* 106* 106* 108*   Lipid Profile: No results for input(s): CHOL, HDL, LDLCALC, TRIG, CHOLHDL, LDLDIRECT in the last 72 hours. Thyroid Function Tests: No results for input(s): TSH, T4TOTAL, FREET4, T3FREE, THYROIDAB in the last 72 hours. Anemia Panel: No results for input(s): VITAMINB12, FOLATE, FERRITIN, TIBC, IRON, RETICCTPCT in the last 72 hours. Sepsis Labs:  Recent Labs Lab 09/20/16 0735  LATICACIDVEN 1.0    Recent Results (from the past 240 hour(s))  Blood Culture (routine x 2)     Status: None   Collection Time: 09/18/16 12:02 PM  Result Value Ref Range Status   Specimen Description BLOOD RIGHT ANTECUBITAL  Final   Special Requests   Final    BOTTLES DRAWN AEROBIC AND ANAEROBIC Blood Culture adequate volume   Culture NO GROWTH 5 DAYS  Final   Report Status 09/23/2016 FINAL  Final  Blood  Culture (routine x 2)     Status: None   Collection Time: 09/18/16 12:25 PM  Result Value Ref Range Status   Specimen Description BLOOD RIGHT HAND  Final   Special Requests IN PEDIATRIC BOTTLE Blood Culture adequate volume  Final   Culture NO GROWTH 5 DAYS  Final   Report Status 09/23/2016 FINAL  Final  Urine culture     Status: Abnormal   Collection Time: 09/18/16  1:54 PM  Result Value Ref Range Status   Specimen Description URINE, RANDOM  Final   Special Requests NONE  Final   Culture MULTIPLE SPECIES PRESENT, SUGGEST RECOLLECTION (A)  Final   Report Status 09/19/2016 FINAL  Final  Culture, Urine     Status: None   Collection Time: 09/20/16 12:42 PM  Result Value Ref Range Status   Specimen Description URINE, RANDOM  Final   Special Requests NONE  Final   Culture NO GROWTH  Final   Report Status 09/21/2016 FINAL  Final  MRSA PCR Screening     Status: Abnormal   Collection Time: 09/22/16  7:47 AM  Result Value Ref Range Status   MRSA by PCR POSITIVE (A) NEGATIVE Final    Comment:        The GeneXpert MRSA Assay (FDA approved for NASAL specimens only), is one component of a comprehensive MRSA colonization surveillance program. It is not intended to diagnose MRSA infection nor to guide or monitor treatment for MRSA infections. RESULT CALLED TO, READ BACK BY AND VERIFIED WITH: Candie Chroman, RN AT [phone] 09/22/16 BY L BENFIELD          Radiology Studies: No results found.      Scheduled Meds: . aspirin EC  81 mg Oral QHS  . feeding supplement (ENSURE ENLIVE)  237 mL Oral QID  . heparin  5,000 Units Subcutaneous Q8H  . lamoTRIgine  200 mg Oral Daily  . levothyroxine  150 mcg Oral QAC breakfast  . lurasidone  40 mg Oral q morning - 10a  . Melatonin  9 mg Oral QHS  . mupirocin ointment  1 application Nasal BID  . pantoprazole  40 mg Oral Daily  . QUEtiapine  150 mg Oral QHS  . topiramate  50 mg Oral BID  . venlafaxine XR  150 mg Oral Q breakfast   Continuous  Infusions:   LOS: 8 days    Dron Tanna Furry, MD [facility] Pager [phone]  If 7PM-7AM, please contact night-coverage [email or web address] Password [email or web address] 09/26/2016, 10:49 AM

## 2016-09-26 NOTE — Consult Note (Signed)
[facility] Consult   Reason for Consult:  Agitation and refusing to take medicine and food Referring Physician:  Dr.Woods Patient Identification: Brandi Love MRN:  [id number] Principal Diagnosis: Eating disorder, unspecified Diagnosis:   Patient Active Problem List   Diagnosis Date Noted  . Sepsis ([place]) [A41.9] 09/18/2016  . Acute pyelonephritis [N10] 09/18/2016  . MDD (major depressive disorder), recurrent, severe, with psychosis ([place]) [F33.3] 12/11/2014  . Eating disorder, unspecified [F50.9] 11/23/2012  . Hypothyroidism [E03.9] 11/23/2012  . Dyslipidemia [E78.5] 11/23/2012  . Sinus tachycardia [R00.0] 11/23/2012  . CKD (chronic kidney disease) stage 3, GFR 30-59 ml/min [N18.3] 11/18/2012  . Protein-calorie malnutrition, severe ([place]) [E43] 11/13/2012  . Abdominal pain, epigastric [R10.13] 11/12/2012  . Dehydration [E86.0] 11/12/2012  . Acute on chronic renal failure ([place]) [N17.9, N18.9] 11/12/2012  . Leukocytosis, unspecified [D72.829] 11/12/2012  . Hyponatremia [E87.1] 11/12/2012  . Borderline personality disorder [F60.3] 11/12/2012    Total Time spent with patient: 30 minutes  Subjective:   Brandi Love is a 41 y.o. female patient admitted with abdominal pain and decreased oral intake.  HPI:  Patient is 41 year old female with significant history of depression, anxiety, diabetes, eating disorder endometriosis GERD, hyperlipidemia hearing impairment, hypothyroidism migraine and pulmonary embolism.  Patient was seen by psychiatry a few days ago and recommended to transfer eating disorder treatment program at [facility].  However patient refusing to take the medication and eating.  She also notices irritable, agitated and lately started to hear voices.  She admitted noncompliant with medication for past 2 weeks.  She is feeling more depressed and she like to lose another 30 pounds by restricting her diet.  Patient hospitalize in the past multiple times and in  recent months this is her third hospitalization.  Patient diagnosed with [place] personality disorder and getting treatment in the past DBT.  Patient has at [facility] team.  She is easily irritable and angry and labile.  Patient uses cochlear implant.  She admitted that she lost 28 pounds since February.  Her last hospitalization was at [facility].  She denies any suicidal thoughts but feels very depressed anxious and bothered by the auditory hallucinations.  She has no plan or intent to kill herself but endorsed that her current and concentration is distracted.  She shows poor insight and judgment into her illness.  Though she wants to get better but she is reluctant to take medication and food.  She is prescribed Latuda, Lamictal, Seroquel, Effexor.  She do not recall any side effects of the medication.  Past Psychiatric History: Patient has multiple hospitalization in the past.  She has history of suicidal attempt by strangulating herself while she was in the hospital.  She stated 3 years in [facility].  She was diagnosed with borderline personality disorder, major depression, bipolar disorder and eating disorder.  09/26/2016 Patient seen, chart reviewed and case discussed with the staff RN for this face-to-face psychiatric consultation and evaluation follow-up. Patient appeared awake, alert, oriented to time place person and situation. Patient reported she has been rehydrated well, drinking her Ensure 3 times daily, compliant with her antibiotics and other medications as prescribed and not taking any solid food with the fear of stomach pain. Patient denies symptoms of depression, anxiety, irritability, agitation or aggressive behavior. Staff nurse reported sometimes she screams and his stated to take her oral medication. Patient is aware of not accepted by [facility] eating disorder inpatient program which she and her eating disorder team has  been hoping for. Patient reported she would like to go  home and continue with her treatment team including ACT team and nutritionist. Patient was encouraged to give a trial of semisolid food while in the hospital.   Risk to Self: Is patient at risk for suicide?: No Risk to Others:   Prior Inpatient Therapy:   Prior Outpatient Therapy:    Past Medical History:  Past Medical History:  Diagnosis Date  . Anemia   . Anxiety   . Arthritis    "hands" (09/18/2016)  . Chronic lower back pain   . Chronic renal insufficiency   . Deafness   . Depression   . Diabetes ([place])    "borderline"  . Eating disorder   . Endometriosis   . GERD (gastroesophageal reflux disease)   . History of kidney stones   . Hyperlipidemia   . Hypertension   . 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 Migraine    "frequency varies" (09/18/2016)  . OCD (obsessive compulsive disorder)   . Pneumonia 2011  . PONV (postoperative nausea and vomiting)   . Pulmonary embolism ([place]) 10/2001  . Seizures ([place]) 09/2001 X 1   "day after my hysterectomy"  . Tachycardia     Past Surgical History:  Procedure Laterality Date  . ABDOMINAL HYSTERECTOMY  09/2001   'except for right ovary'  . BREAST LUMPECTOMY Left 2000   benign  . BREAST LUMPECTOMY Right 2002   benign  . COCHLEAR IMPLANT Bilateral 2004-2010   "right-left"  . EYE MUSCLE SURGERY Left 1980   "lazy eye"  . EYE MUSCLE SURGERY Right 1990   "lazy eye"  . LAPAROSCOPIC ABDOMINAL EXPLORATION  2001 and 2002   for endometriosis   Family History:  Family History  Problem Relation Age of Onset  . Cancer Father   . Hyperlipidemia Father   . Osteoarthritis Mother   . Hyperlipidemia Mother    Family Psychiatric  History: Patient endorsed multiple family member has mental illness. Social History:  History  Alcohol Use No     History  Drug Use No    Social History   Social History  . Marital status: Single    Spouse name: N/A  . Number of children: 0  . Years of education: 14   Occupational  History  . Disabled.    Social History Main Topics  . Smoking status: Never Smoker  . Smokeless tobacco: Never Used  . Alcohol use No  . Drug use: No  . Sexual activity: No   Other Topics Concern  . None   Social History Narrative   Lives alone.  Single.  No family in town.     Additional Social History:    Allergies:   Allergies  Allergen Reactions  . Dhea [Nutritional Supplements] Nausea And Vomiting  . Entex Lq [Phenylephrine-Guaifenesin] Shortness Of Breath and Swelling    Throat swelling  . Indomethacin Nausea And Vomiting  . Iodinated Diagnostic Agents Anaphylaxis    'cant breathe'  . Propoxyphene Anaphylaxis  . Risperidone And Related Other (See Comments)    Out of body experience  . Shellfish Allergy Other (See Comments)    unspecified  . Depakote [Divalproex Sodium] Nausea And Vomiting and Other (See Comments)    Weight gain  . Abilify [Aripiprazole] Nausea And Vomiting  . Dihydroergotamine Nausea And Vomiting  . Doxycycline Nausea And Vomiting  . Erythromycin Nausea And Vomiting  . Lactose Intolerance (Gi) Nausea And Vomiting  . Nsaids Other (  See Comments)    Unable to ttake due to renal insufficency  . Tape Other (See Comments)    Redness, can use paper tape    Labs:  No results found for this or any previous visit (from the past 48 hour(s)).  Current Facility-Administered Medications  Medication Dose Route Frequency Provider Last Rate Last Dose  . acetaminophen (TYLENOL) suppository 650 mg  650 mg Rectal Q6H PRN Allie Bossier, MD   650 mg at 09/26/16 0033   Or  . acetaminophen (TYLENOL) tablet 650 mg  650 mg Oral Q6H PRN Allie Bossier, MD      . aspirin EC tablet 81 mg  81 mg Oral QHS Waldemar Dickens, MD   81 mg at 09/25/16 2116  . cyclobenzaprine (FLEXERIL) tablet 10 mg  10 mg Oral BID PRN Waldemar Dickens, MD      . diphenhydrAMINE (BENADRYL) capsule 25-50 mg  25-50 mg Oral Q4H PRN Schorr, Rhetta Mura, NP   50 mg at 09/26/16 0223  . feeding  supplement (ENSURE ENLIVE) (ENSURE ENLIVE) liquid 237 mL  237 mL Oral QID Allie Bossier, MD   237 mL at 09/26/16 0830  . haloperidol lactate (HALDOL) injection 2 mg  2 mg Intravenous Q6H PRN Cherene Altes, MD      . heparin injection 5,000 Units  5,000 Units Subcutaneous Q8H Waldemar Dickens, MD   5,000 Units at 09/25/16 2117  . lamoTRIgine (LAMICTAL) tablet 200 mg  200 mg Oral Daily Allie Bossier, MD   200 mg at 09/26/16 7124  . levothyroxine (SYNTHROID, LEVOTHROID) tablet 150 mcg  150 mcg Oral QAC breakfast Cherene Altes, MD   150 mcg at 09/26/16 0830  . LORazepam (ATIVAN) injection 1-2 mg  1-2 mg Intravenous Q6H PRN Allie Bossier, MD   1 mg at 09/24/16 5809  . lurasidone (LATUDA) tablet 40 mg  40 mg Oral q morning - 10a Cherene Altes, MD   40 mg at 09/26/16 0830  . Melatonin TABS 9 mg  9 mg Oral QHS Waldemar Dickens, MD   9 mg at 09/25/16 2117  . mupirocin ointment (BACTROBAN) 2 % 1 application  1 application Nasal BID Allie Bossier, MD   1 application at 98/33/82 0831  . ondansetron (ZOFRAN) injection 4 mg  4 mg Intravenous Q6H PRN Opyd, Ilene Qua, MD   4 mg at 09/25/16 0904  . pantoprazole (PROTONIX) EC tablet 40 mg  40 mg Oral Daily Waldemar Dickens, MD   40 mg at 09/26/16 0830  . promethazine (PHENERGAN) suppository 25 mg  25 mg Rectal Q6H PRN Allie Bossier, MD      . QUEtiapine (SEROQUEL XR) 24 hr tablet 150 mg  150 mg Oral QHS Waldemar Dickens, MD   150 mg at 09/22/16 2213  . SUMAtriptan (IMITREX) tablet 100 mg  100 mg Oral Q2H PRN Waldemar Dickens, MD      . topiramate (TOPAMAX) tablet 50 mg  50 mg Oral BID Waldemar Dickens, MD   50 mg at 09/26/16 0830  . venlafaxine XR (EFFEXOR-XR) 24 hr capsule 150 mg  150 mg Oral Q breakfast Waldemar Dickens, MD   150 mg at 09/26/16 0830    Musculoskeletal: Strength & Muscle Tone: increased and decreased Gait & Station: Lying on her bed Patient leans: N/A  Psychiatric Specialty Exam: Physical Exam  Review of Systems   HENT: Negative.   Musculoskeletal: Negative.   Skin:  Negative.   Neurological: Positive for weakness.  Psychiatric/Behavioral: Positive for hallucinations.    Blood pressure 128/85, pulse 95, temperature 98.8 F (37.1 C), temperature source Oral, resp. rate 16, height 5\' 3"  (1.6 m), weight 74.9 kg (165 lb 2 oz), SpO2 98 %.Body mass index is 29.25 kg/m.  General Appearance: Guarded  Eye Contact:  Fair  Speech:  Slow  Volume:  Decreased  Mood:  Anxious, Depressed, Hopeless and Irritable  Affect:  Constricted and Depressed  Thought Process:  Goal Directed  Orientation:  Full (Time, Place, and Person)  Thought Content:  Hallucinations: Auditory and Rumination  Suicidal Thoughts:  No  Homicidal Thoughts:  No  Memory:  Immediate;   Fair Recent;   Fair Remote;   Fair  Judgement:  Impaired  Insight:  Lacking  Psychomotor Activity:  Decreased  Concentration:  Concentration: Fair and Attention Span: Fair  Recall:  Good  Fund of Knowledge:  Good  Language:  Fair  Akathisia:  No  Handed:  Right  AIMS (if indicated):     Assets:  Communication Skills Desire for Improvement Social Support  ADL's:  Impaired  Cognition:  WNL  Sleep:        Treatment Plan Summary:  Patient has no suicidal/homicidal ideation or evidence of psychosis. Recent does not meet criteria for acute psychiatric hospitalization.  Patient does not required involuntary commitment as she has no danger to self or others and able to compliant with treatment even though refusing to eat solid foods.  Patient does not meet criteria for forced psychiatric medication management at this visit  Disposition: Patient may be discharged to the home and outpatient eating disorder treatment team including ACT team and eating disorder nutritionist. Supportive therapy provided about ongoing stressors.  Ambrose Finland, MD 09/26/2016 11:04 AM

## 2016-09-26 NOTE — Progress Notes (Signed)
CSW met with patient this morning for an hour and completed intake paperwork for [facility].  Intake form faxed into [facility] for review but likely not determination will be made today and MD thinks pt may be medically stable for DC today.  CSW called [facility] again- MDs have officially denied patient due to age, non-compliance, and pt BMI (states that pt is 115% of ideal body weight and their program usually discharges pts at 80-85% of ideal body weight)  CSW spoke with pt about possibility of needing to go home before eating disorder rehab- patient became tearful and states she would just go home and die.  CSW asked for clarification- pt states she does NOT intend to hurt herself but does feel as if she could die from being malnurished and dehydrated if she goes home.    States that she was crawling at home prior to admission due to weakness and does not feel any stronger than when she came in.  Patient does not think she could get to appointments if discharged home- usually uses [facility] but is too weak to get in a car.  CSW continuing to work with pt outpatient providers to examine options  Jorge Ny, [place] Social Worker [phone]

## 2016-09-27 LAB — URINALYSIS, ROUTINE W REFLEX MICROSCOPIC
BILIRUBIN URINE: NEGATIVE
GLUCOSE, UA: NEGATIVE mg/dL
HGB URINE DIPSTICK: NEGATIVE
KETONES UR: NEGATIVE mg/dL
Leukocytes, UA: NEGATIVE
Nitrite: NEGATIVE
PH: 6 (ref 5.0–8.0)
Protein, ur: NEGATIVE mg/dL
Specific Gravity, Urine: 1.013 (ref 1.005–1.030)

## 2016-09-27 NOTE — Discharge Summary (Signed)
Physician Discharge Summary  Brandi Love YQI:[id number] DOB: 1976/04/02 DOA: 09/18/2016  PCP: Vickii Penna, MD  Admit date: 09/18/2016 Discharge date: 09/27/2016  Admitted From:Home Disposition: Home with home care services    Recommendations for Outpatient Follow-up:  1. Follow up with PCP and psychiatrist in 1-2 weeks 2. Please obtain BMP/CBC in one week   Home Health: Yes Equipment/Devices: No Discharge Condition: Stable CODE STATUS: Full code Diet recommendation: Heart healthy  Brief/Interim Summary: 41 y.o.FHx Anxiety/Depression, Deafness w/ cochlear implants, CKD, DM, eating disorder, endometriosis, GERD, hyperlipidemia, hypertension, hypothyroidism, migraines, DVT/pulmonary embolus, and seizures who presented to the ED w/ suprapubic abdominal pain w/ dark malodorous urine.   # Sepsis due to pyelonephritis: Urine culture likely contaminated. Patient completed 5 days of empiric antibiotics and clinically improved. Blood cultures negative. -Repeat a urine culture negative today. Recommended to follow-up with PCP.  #Hypotension: Due to dehydration. Improved with IV fluid. Continue to monitor blood pressure.  #Depression/eating disorder/acute psychosis/borderline personality disorder: -Evaluated by psychiatrist. Unable to transfer to outside facility. Discussed with the social worker, psychiatrist and the patient in detail. Patient is clinically improved and able to tolerate diet and medications. She wants to go home and reported that she has support at home. Home care services arranged on discharge. I recommended her to follow-up with her eating disorder clinic and her psychiatrist. She verbalized understanding. At this time patient is medically stable to transfer her care to outpatient with close observation. Resume home medications.  # Seizure disorder: Currently on Lamictal.   # hypothyroidism: Continue Synthroid  #GERD: Continue Protonix  # Insomnia: Continue  melatonin  # CKD stage 3: monitor BMP. Stable now.  #Deafness: Patient uses her cochlear implant.  Patient is clinically stable. There is outpatient follow-up. She was very pleasant and verbalized understanding of follow-up instruction. She denied low mood or suicidal or homicidal ideation.  Discharge Diagnoses:  Principal Problem:   Eating disorder, unspecified Active Problems:   Borderline personality disorder   CKD (chronic kidney disease) stage 3, GFR 30-59 ml/min   Hypothyroidism   MDD (major depressive disorder), recurrent, severe, with psychosis ([place])   Sepsis ([place])   Acute pyelonephritis    Discharge Instructions  Discharge Instructions    Activity as tolerated - No restrictions    Complete by:  As directed    Call MD for:  difficulty breathing, headache or visual disturbances    Complete by:  As directed    Call MD for:  extreme fatigue    Complete by:  As directed    Call MD for:  hives    Complete by:  As directed    Call MD for:  persistant dizziness or light-headedness    Complete by:  As directed    Call MD for:  persistant nausea and vomiting    Complete by:  As directed    Call MD for:  severe uncontrolled pain    Complete by:  As directed    Call MD for:  temperature >100.4    Complete by:  As directed    Diet - low sodium heart healthy    Complete by:  As directed    Increase activity slowly    Complete by:  As directed      Allergies as of 09/27/2016      Reactions   Dhea [nutritional Supplements] Nausea And Vomiting   Entex Lq [phenylephrine-guaifenesin] Shortness Of Breath, Swelling   Throat swelling   Indomethacin Nausea And Vomiting   Iodinated  Diagnostic Agents Anaphylaxis   'cant breathe'   Propoxyphene Anaphylaxis   Risperidone And Related Other (See Comments)   Out of body experience   Shellfish Allergy Other (See Comments)   unspecified   Depakote [divalproex Sodium] Nausea And Vomiting, Other (See Comments)   Weight gain    Abilify [aripiprazole] Nausea And Vomiting   Dihydroergotamine Nausea And Vomiting   Doxycycline Nausea And Vomiting   Erythromycin Nausea And Vomiting   Lactose Intolerance (gi) Nausea And Vomiting   Nsaids Other (See Comments)   Unable to ttake due to renal insufficency   Tape Other (See Comments)   Redness, can use paper tape      Medication List    STOP taking these medications   omeprazole 20 MG capsule Commonly known as:  PRILOSEC   oxyCODONE-acetaminophen 5-325 MG tablet Commonly known as:  PERCOCET/ROXICET   pravastatin 20 MG tablet Commonly known as:  PRAVACHOL     TAKE these medications   aspirin EC 81 MG tablet Take 81 mg by mouth at bedtime.   atorvastatin 20 MG tablet Commonly known as:  LIPITOR Take 20 mg by mouth daily.   cetirizine 10 MG tablet Commonly known as:  ZYRTEC Take 10 mg by mouth daily.   cyclobenzaprine 10 MG tablet Commonly known as:  FLEXERIL Take 1 tablet (10 mg total) by mouth 2 (two) times daily as needed for muscle spasms.   docusate sodium 100 MG capsule Commonly known as:  COLACE Take 100 mg by mouth 2 (two) times daily.   haloperidol 5 MG tablet Commonly known as:  HALDOL Take 5 mg by mouth at bedtime.   hydrOXYzine 50 MG tablet Commonly known as:  ATARAX/VISTARIL Take 50 mg by mouth 3 (three) times daily as needed.   lamoTRIgine 200 MG tablet Commonly known as:  LAMICTAL Take 200 mg by mouth daily.   LATUDA 20 MG Tabs tablet Generic drug:  lurasidone Take 40 mg by mouth.   levothyroxine 150 MCG tablet Commonly known as:  SYNTHROID, LEVOTHROID Take 150 mcg by mouth daily.   magnesium oxide 400 MG tablet Commonly known as:  MAG-OX Take 400 mg by mouth 2 (two) times daily.   Melatonin 10 MG Tabs Take 10 mg by mouth at bedtime.   metoprolol succinate 50 MG 24 hr tablet Commonly known as:  TOPROL-XL Take 25 mg by mouth every evening.   multivitamin with minerals Tabs tablet Take 1 tablet by mouth daily.    ondansetron 4 MG tablet Commonly known as:  ZOFRAN Take 4 mg by mouth every 8 (eight) hours as needed for nausea.   pantoprazole 40 MG tablet Commonly known as:  PROTONIX Take 40 mg by mouth daily.   promethazine 25 MG tablet Commonly known as:  PHENERGAN Take 1 tablet (25 mg total) by mouth every 6 (six) hours as needed for nausea or vomiting.   SEROQUEL XR 150 MG 24 hr tablet Generic drug:  QUEtiapine Fumarate Take 150 mg by mouth at bedtime.   SUMAtriptan 100 MG tablet Commonly known as:  IMITREX Take 100 mg by mouth every 2 (two) hours as needed for migraine.   topiramate 50 MG tablet Commonly known as:  TOPAMAX Take 50 mg by mouth 2 (two) times daily.   venlafaxine XR 150 MG 24 hr capsule Commonly known as:  EFFEXOR-XR Take 150 mg by mouth daily with breakfast.   Vitamin D (Ergocalciferol) 50000 units Caps capsule Commonly known as:  DRISDOL Take 50,000 Units by mouth every  7 (seven) days. On Sunday      Follow-up Information    Vickii Penna, MD. Schedule an appointment as soon as possible for a visit in 1 week(s).   Specialty:  Family Medicine Contact information: [address] [place] [address] [phone]          Allergies  Allergen Reactions  . Dhea [Nutritional Supplements] Nausea And Vomiting  . Entex Lq [Phenylephrine-Guaifenesin] Shortness Of Breath and Swelling    Throat swelling  . Indomethacin Nausea And Vomiting  . Iodinated Diagnostic Agents Anaphylaxis    'cant breathe'  . Propoxyphene Anaphylaxis  . Risperidone And Related Other (See Comments)    Out of body experience  . Shellfish Allergy Other (See Comments)    unspecified  . Depakote [Divalproex Sodium] Nausea And Vomiting and Other (See Comments)    Weight gain  . Abilify [Aripiprazole] Nausea And Vomiting  . Dihydroergotamine Nausea And Vomiting  . Doxycycline Nausea And Vomiting  . Erythromycin Nausea And Vomiting  . Lactose Intolerance (Gi) Nausea And Vomiting   . Nsaids Other (See Comments)    Unable to ttake due to renal insufficency  . Tape Other (See Comments)    Redness, can use paper tape    Consultations: Psychiatrist  Procedures/Studies: None Antimicrobials: Ceftriaxone 5/30 > 6/4 Zosyn 5/31 > 6/1 Vancomycin 5/30 > 5/31 Subjective: Seen and examined at bedside. She was alert awake and comfortable. Denied headache, dizziness, nausea, vomiting, chest pain, shortness of breath.  Discharge Exam: Vitals:   09/26/16 1406 09/26/16 2109  BP: 125/80 121/78  Pulse: 85 91  Resp: 18 17  Temp: 98 F (36.7 C) 99.2 F (37.3 C)   Vitals:   09/25/16 2205 09/26/16 0608 09/26/16 1406 09/26/16 2109  BP:  128/85 125/80 121/78  Pulse:  95 85 91  Resp:  16 18 17   Temp:  98.8 F (37.1 C) 98 F (36.7 C) 99.2 F (37.3 C)  TempSrc: Oral Oral Oral Oral  SpO2:  98% 99% 97%  Weight:      Height:        General: Pt is alert, awake, not in acute distress Cardiovascular: RRR, S1/S2 +, no rubs, no gallops Respiratory: CTA bilaterally, no wheezing, no rhonchi Abdominal: Soft, NT, ND, bowel sounds + Extremities: no edema, no cyanosis Psychiatrist: Pleasant, mood and judgment normal. Denied suicidal or homicidal ideation.   The results of significant diagnostics from this hospitalization (including imaging, microbiology, ancillary and laboratory) are listed below for reference.     Microbiology: Recent Results (from the past 240 hour(s))  Blood Culture (routine x 2)     Status: None   Collection Time: 09/18/16 12:02 PM  Result Value Ref Range Status   Specimen Description BLOOD RIGHT ANTECUBITAL  Final   Special Requests   Final    BOTTLES DRAWN AEROBIC AND ANAEROBIC Blood Culture adequate volume   Culture NO GROWTH 5 DAYS  Final   Report Status 09/23/2016 FINAL  Final  Blood Culture (routine x 2)     Status: None   Collection Time: 09/18/16 12:25 PM  Result Value Ref Range Status   Specimen Description BLOOD RIGHT HAND  Final    Special Requests IN PEDIATRIC BOTTLE Blood Culture adequate volume  Final   Culture NO GROWTH 5 DAYS  Final   Report Status 09/23/2016 FINAL  Final  Urine culture     Status: Abnormal   Collection Time: 09/18/16  1:54 PM  Result Value Ref Range Status  Specimen Description URINE, RANDOM  Final   Special Requests NONE  Final   Culture MULTIPLE SPECIES PRESENT, SUGGEST RECOLLECTION (A)  Final   Report Status 09/19/2016 FINAL  Final  Culture, Urine     Status: None   Collection Time: 09/20/16 12:42 PM  Result Value Ref Range Status   Specimen Description URINE, RANDOM  Final   Special Requests NONE  Final   Culture NO GROWTH  Final   Report Status 09/21/2016 FINAL  Final  MRSA PCR Screening     Status: Abnormal   Collection Time: 09/22/16  7:47 AM  Result Value Ref Range Status   MRSA by PCR POSITIVE (A) NEGATIVE Final    Comment:        The GeneXpert MRSA Assay (FDA approved for NASAL specimens only), is one component of a comprehensive MRSA colonization surveillance program. It is not intended to diagnose MRSA infection nor to guide or monitor treatment for MRSA infections. RESULT CALLED TO, READ BACK BY AND VERIFIED WITHCandie Chroman, RN AT [phone] 09/22/16 BY L BENFIELD      Labs: BNP (last 3 results) No results for input(s): BNP in the last 8760 hours. Basic Metabolic Panel:  Recent Labs Lab 09/22/16 0632  NA 139  K 4.4  CL 109  CO2 22  GLUCOSE 116*  BUN 16  CREATININE 1.21*  CALCIUM 10.2   Liver Function Tests: No results for input(s): AST, ALT, ALKPHOS, BILITOT, PROT, ALBUMIN in the last 168 hours. No results for input(s): LIPASE, AMYLASE in the last 168 hours. No results for input(s): AMMONIA in the last 168 hours. CBC:  Recent Labs Lab 09/22/16 0632  WBC 6.6  HGB 10.8*  HCT 32.1*  MCV 90.7  PLT 278   Cardiac Enzymes: No results for input(s): CKTOTAL, CKMB, CKMBINDEX, TROPONINI in the last 168 hours. BNP: Invalid input(s): POCBNP CBG:  Recent  Labs Lab 09/21/16 0911 09/21/16 1413 09/21/16 1805 09/21/16 2110 09/23/16 2136  GLUCAP 95 135* 106* 106* 108*   D-Dimer No results for input(s): DDIMER in the last 72 hours. Hgb A1c No results for input(s): HGBA1C in the last 72 hours. Lipid Profile No results for input(s): CHOL, HDL, LDLCALC, TRIG, CHOLHDL, LDLDIRECT in the last 72 hours. Thyroid function studies No results for input(s): TSH, T4TOTAL, T3FREE, THYROIDAB in the last 72 hours.  Invalid input(s): FREET3 Anemia work up No results for input(s): VITAMINB12, FOLATE, FERRITIN, TIBC, IRON, RETICCTPCT in the last 72 hours. Urinalysis    Component Value Date/Time   COLORURINE YELLOW 09/27/2016 [place] 09/27/2016 0954   LABSPEC 1.013 09/27/2016 0954   PHURINE 6.0 09/27/2016 0954   GLUCOSEU NEGATIVE 09/27/2016 0954   HGBUR NEGATIVE 09/27/2016 0954   BILIRUBINUR NEGATIVE 09/27/2016 0954   KETONESUR NEGATIVE 09/27/2016 0954   PROTEINUR NEGATIVE 09/27/2016 0954   UROBILINOGEN 0.2 12/14/2012 0816   NITRITE NEGATIVE 09/27/2016 0954   LEUKOCYTESUR NEGATIVE 09/27/2016 0954   Sepsis Labs Invalid input(s): PROCALCITONIN,  WBC,  LACTICIDVEN Microbiology Recent Results (from the past 240 hour(s))  Blood Culture (routine x 2)     Status: None   Collection Time: 09/18/16 12:02 PM  Result Value Ref Range Status   Specimen Description BLOOD RIGHT ANTECUBITAL  Final   Special Requests   Final    BOTTLES DRAWN AEROBIC AND ANAEROBIC Blood Culture adequate volume   Culture NO GROWTH 5 DAYS  Final   Report Status 09/23/2016 FINAL  Final  Blood Culture (routine x 2)  Status: None   Collection Time: 09/18/16 12:25 PM  Result Value Ref Range Status   Specimen Description BLOOD RIGHT HAND  Final   Special Requests IN PEDIATRIC BOTTLE Blood Culture adequate volume  Final   Culture NO GROWTH 5 DAYS  Final   Report Status 09/23/2016 FINAL  Final  Urine culture     Status: Abnormal   Collection Time: 09/18/16   1:54 PM  Result Value Ref Range Status   Specimen Description URINE, RANDOM  Final   Special Requests NONE  Final   Culture MULTIPLE SPECIES PRESENT, SUGGEST RECOLLECTION (A)  Final   Report Status 09/19/2016 FINAL  Final  Culture, Urine     Status: None   Collection Time: 09/20/16 12:42 PM  Result Value Ref Range Status   Specimen Description URINE, RANDOM  Final   Special Requests NONE  Final   Culture NO GROWTH  Final   Report Status 09/21/2016 FINAL  Final  MRSA PCR Screening     Status: Abnormal   Collection Time: 09/22/16  7:47 AM  Result Value Ref Range Status   MRSA by PCR POSITIVE (A) NEGATIVE Final    Comment:        The GeneXpert MRSA Assay (FDA approved for NASAL specimens only), is one component of a comprehensive MRSA colonization surveillance program. It is not intended to diagnose MRSA infection nor to guide or monitor treatment for MRSA infections. RESULT CALLED TO, READ BACK BY AND VERIFIED WITH: Candie Chroman, RN AT [phone] 09/22/16 BY L BENFIELD      Time coordinating discharge: 32 minutes  SIGNED:   Rosita Fire, MD  [facility] 09/27/2016, 10:45 AM  If 7PM-7AM, please contact night-coverage [email or web address] Password [email or web address]

## 2016-09-27 NOTE — Progress Notes (Signed)
Reviewed with patient her discharge paperwork, got patient ready, and packed up belongings. Awaiting her ride.

## 2016-09-27 NOTE — Progress Notes (Signed)
Brandi Love to be D/C'd  per MD order. Discussed with the patient and all questions fully answered.  VSS, Skin clean, dry and intact without evidence of skin break down, no evidence of skin tears noted.  IV catheter discontinued intact. Site without signs and symptoms of complications. Dressing and pressure applied.  An After Visit Summary was printed and given to the patient. Patient received prescription.  D/c education completed with patient/family including follow up instructions, medication list, d/c activities limitations if indicated, with other d/c instructions as indicated by MD - patient able to verbalize understanding, all questions fully answered.   Patient instructed to return to ED, call 911, or call MD for any changes in condition.   Patient to be escorted via [place], and D/C home via private auto.

## 2016-09-27 NOTE — Progress Notes (Signed)
CSW spoke with pt concerning return home today.  Pt has been very proactive this morning organizing her discharge home. Pt much more alert than she has been and smiled and laughed during conversation- feels ok about going home today with her outpatient support.  Pt parents will be arriving between 11:30-12pm to pick patient up and take back to her appointment.  Pt has been in contact with ACT team (Phone: [phone], Fax: [phone]) and they will be checking in on patient at home today.  CSW to fax DC summary to ACT team so they have current medication list.  CSW attempted to complete [employer] application with patient she is adamantly against going to [place].  CSW updated MD that pt is prepared to go home today  Jorge Ny, [place] Social Worker [phone]

## 2016-09-27 NOTE — Progress Notes (Signed)
Patient let me know her only ride has to pick her up before noon today, I paged MD and let him know so he could put in her d/c paperwork.

## 2016-09-29 ENCOUNTER — Inpatient Hospital Stay (HOSPITAL_COMMUNITY)
Admission: EM | Admit: 2016-09-29 | Discharge: 2016-10-11 | DRG: 887 | Disposition: A | Discharge status: Home / Self Care | Payer: Medicare Other | Attending: Internal Medicine | Admitting: Internal Medicine

## 2016-09-29 ENCOUNTER — Inpatient Hospital Stay (HOSPITAL_COMMUNITY): Payer: Medicare Other

## 2016-09-29 ENCOUNTER — Encounter (HOSPITAL_COMMUNITY): Payer: Self-pay | Admitting: Emergency Medicine

## 2016-09-29 DIAGNOSIS — R112 Nausea with vomiting, unspecified: Secondary | ICD-10-CM | POA: Diagnosis not present

## 2016-09-29 DIAGNOSIS — Z888 Allergy status to other drugs, medicaments and biological substances status: Secondary | ICD-10-CM

## 2016-09-29 DIAGNOSIS — E1122 Type 2 diabetes mellitus with diabetic chronic kidney disease: Secondary | ICD-10-CM | POA: Diagnosis present

## 2016-09-29 DIAGNOSIS — D638 Anemia in other chronic diseases classified elsewhere: Secondary | ICD-10-CM | POA: Diagnosis present

## 2016-09-29 DIAGNOSIS — Z7982 Long term (current) use of aspirin: Secondary | ICD-10-CM

## 2016-09-29 DIAGNOSIS — F329 Major depressive disorder, single episode, unspecified: Secondary | ICD-10-CM | POA: Diagnosis present

## 2016-09-29 DIAGNOSIS — R32 Unspecified urinary incontinence: Secondary | ICD-10-CM | POA: Diagnosis present

## 2016-09-29 DIAGNOSIS — F603 Borderline personality disorder: Secondary | ICD-10-CM | POA: Diagnosis present

## 2016-09-29 DIAGNOSIS — N179 Acute kidney failure, unspecified: Secondary | ICD-10-CM | POA: Diagnosis present

## 2016-09-29 DIAGNOSIS — Z8719 Personal history of other diseases of the digestive system: Secondary | ICD-10-CM

## 2016-09-29 DIAGNOSIS — R000 Tachycardia, unspecified: Secondary | ICD-10-CM | POA: Diagnosis present

## 2016-09-29 DIAGNOSIS — D649 Anemia, unspecified: Secondary | ICD-10-CM | POA: Diagnosis not present

## 2016-09-29 DIAGNOSIS — F429 Obsessive-compulsive disorder, unspecified: Secondary | ICD-10-CM | POA: Diagnosis present

## 2016-09-29 DIAGNOSIS — Z0189 Encounter for other specified special examinations: Secondary | ICD-10-CM

## 2016-09-29 DIAGNOSIS — E860 Dehydration: Secondary | ICD-10-CM | POA: Diagnosis present

## 2016-09-29 DIAGNOSIS — R1013 Epigastric pain: Secondary | ICD-10-CM | POA: Diagnosis present

## 2016-09-29 DIAGNOSIS — F502 Bulimia nervosa: Principal | ICD-10-CM | POA: Diagnosis present

## 2016-09-29 DIAGNOSIS — E875 Hyperkalemia: Secondary | ICD-10-CM | POA: Diagnosis not present

## 2016-09-29 DIAGNOSIS — E781 Pure hyperglyceridemia: Secondary | ICD-10-CM | POA: Diagnosis present

## 2016-09-29 DIAGNOSIS — Z4659 Encounter for fitting and adjustment of other gastrointestinal appliance and device: Secondary | ICD-10-CM

## 2016-09-29 DIAGNOSIS — Z79899 Other long term (current) drug therapy: Secondary | ICD-10-CM

## 2016-09-29 DIAGNOSIS — E785 Hyperlipidemia, unspecified: Secondary | ICD-10-CM | POA: Diagnosis present

## 2016-09-29 DIAGNOSIS — Z9621 Cochlear implant status: Secondary | ICD-10-CM | POA: Diagnosis present

## 2016-09-29 DIAGNOSIS — H905 Unspecified sensorineural hearing loss: Secondary | ICD-10-CM | POA: Diagnosis not present

## 2016-09-29 DIAGNOSIS — Z8742 Personal history of other diseases of the female genital tract: Secondary | ICD-10-CM

## 2016-09-29 DIAGNOSIS — R197 Diarrhea, unspecified: Secondary | ICD-10-CM | POA: Diagnosis not present

## 2016-09-29 DIAGNOSIS — Z915 Personal history of self-harm: Secondary | ICD-10-CM

## 2016-09-29 DIAGNOSIS — E43 Unspecified severe protein-calorie malnutrition: Secondary | ICD-10-CM | POA: Diagnosis present

## 2016-09-29 DIAGNOSIS — E872 Acidosis: Secondary | ICD-10-CM | POA: Diagnosis present

## 2016-09-29 DIAGNOSIS — K921 Melena: Secondary | ICD-10-CM | POA: Diagnosis present

## 2016-09-29 DIAGNOSIS — F5089 Other specified eating disorder: Secondary | ICD-10-CM | POA: Diagnosis not present

## 2016-09-29 DIAGNOSIS — M19041 Primary osteoarthritis, right hand: Secondary | ICD-10-CM | POA: Diagnosis present

## 2016-09-29 DIAGNOSIS — Z881 Allergy status to other antibiotic agents status: Secondary | ICD-10-CM

## 2016-09-29 DIAGNOSIS — G40909 Epilepsy, unspecified, not intractable, without status epilepticus: Secondary | ICD-10-CM | POA: Diagnosis present

## 2016-09-29 DIAGNOSIS — Z90710 Acquired absence of both cervix and uterus: Secondary | ICD-10-CM

## 2016-09-29 DIAGNOSIS — Z5329 Procedure and treatment not carried out because of patient's decision for other reasons: Secondary | ICD-10-CM | POA: Diagnosis not present

## 2016-09-29 DIAGNOSIS — F419 Anxiety disorder, unspecified: Secondary | ICD-10-CM | POA: Diagnosis present

## 2016-09-29 DIAGNOSIS — K219 Gastro-esophageal reflux disease without esophagitis: Secondary | ICD-10-CM | POA: Diagnosis present

## 2016-09-29 DIAGNOSIS — H903 Sensorineural hearing loss, bilateral: Secondary | ICD-10-CM | POA: Diagnosis present

## 2016-09-29 DIAGNOSIS — N183 Chronic kidney disease, stage 3 unspecified: Secondary | ICD-10-CM | POA: Diagnosis present

## 2016-09-29 DIAGNOSIS — G43909 Migraine, unspecified, not intractable, without status migrainosus: Secondary | ICD-10-CM | POA: Diagnosis present

## 2016-09-29 DIAGNOSIS — E1165 Type 2 diabetes mellitus with hyperglycemia: Secondary | ICD-10-CM | POA: Diagnosis present

## 2016-09-29 DIAGNOSIS — M19042 Primary osteoarthritis, left hand: Secondary | ICD-10-CM | POA: Diagnosis present

## 2016-09-29 DIAGNOSIS — R109 Unspecified abdominal pain: Secondary | ICD-10-CM | POA: Diagnosis present

## 2016-09-29 DIAGNOSIS — Z8659 Personal history of other mental and behavioral disorders: Secondary | ICD-10-CM

## 2016-09-29 DIAGNOSIS — Z886 Allergy status to analgesic agent status: Secondary | ICD-10-CM

## 2016-09-29 DIAGNOSIS — N809 Endometriosis, unspecified: Secondary | ICD-10-CM | POA: Diagnosis present

## 2016-09-29 DIAGNOSIS — G8929 Other chronic pain: Secondary | ICD-10-CM | POA: Diagnosis not present

## 2016-09-29 DIAGNOSIS — Z91048 Other nonmedicinal substance allergy status: Secondary | ICD-10-CM

## 2016-09-29 DIAGNOSIS — R5381 Other malaise: Secondary | ICD-10-CM | POA: Diagnosis present

## 2016-09-29 DIAGNOSIS — R1310 Dysphagia, unspecified: Secondary | ICD-10-CM | POA: Diagnosis present

## 2016-09-29 DIAGNOSIS — D72829 Elevated white blood cell count, unspecified: Secondary | ICD-10-CM | POA: Diagnosis present

## 2016-09-29 DIAGNOSIS — E031 Congenital hypothyroidism without goiter: Secondary | ICD-10-CM | POA: Diagnosis present

## 2016-09-29 DIAGNOSIS — Z86711 Personal history of pulmonary embolism: Secondary | ICD-10-CM

## 2016-09-29 DIAGNOSIS — F509 Eating disorder, unspecified: Secondary | ICD-10-CM | POA: Diagnosis not present

## 2016-09-29 DIAGNOSIS — N3281 Overactive bladder: Secondary | ICD-10-CM | POA: Diagnosis present

## 2016-09-29 DIAGNOSIS — F552 Abuse of laxatives: Secondary | ICD-10-CM | POA: Diagnosis present

## 2016-09-29 DIAGNOSIS — Z91041 Radiographic dye allergy status: Secondary | ICD-10-CM

## 2016-09-29 DIAGNOSIS — Z91013 Allergy to seafood: Secondary | ICD-10-CM

## 2016-09-29 DIAGNOSIS — Z6823 Body mass index (BMI) 23.0-23.9, adult: Secondary | ICD-10-CM

## 2016-09-29 DIAGNOSIS — I129 Hypertensive chronic kidney disease with stage 1 through stage 4 chronic kidney disease, or unspecified chronic kidney disease: Secondary | ICD-10-CM | POA: Diagnosis present

## 2016-09-29 DIAGNOSIS — F5002 Anorexia nervosa, binge eating/purging type: Secondary | ICD-10-CM | POA: Diagnosis present

## 2016-09-29 DIAGNOSIS — M545 Low back pain: Secondary | ICD-10-CM | POA: Diagnosis present

## 2016-09-29 LAB — BASIC METABOLIC PANEL
ANION GAP: 15 (ref 5–15)
BUN: 29 mg/dL — AB (ref 6–20)
CALCIUM: 11.1 mg/dL — AB (ref 8.9–10.3)
CO2: 18 mmol/L — ABNORMAL LOW (ref 22–32)
CREATININE: 2.2 mg/dL — AB (ref 0.44–1.00)
Chloride: 104 mmol/L (ref 101–111)
GFR calc Af Amer: 32 mL/min — ABNORMAL LOW (ref >60)
GFR, EST NON AFRICAN AMERICAN: 27 mL/min — AB (ref >60)
Glucose, Bld: 141 mg/dL — ABNORMAL HIGH (ref 65–99)
Potassium: 4.2 mmol/L (ref 3.5–5.1)
Sodium: 137 mmol/L (ref 135–145)

## 2016-09-29 LAB — CBC WITH DIFFERENTIAL/PLATELET
BASOS ABS: 0.1 10*3/uL (ref 0.0–0.1)
Basophils Relative: 0 %
EOS PCT: 1 %
Eosinophils Absolute: 0.1 10*3/uL (ref 0.0–0.7)
HCT: 45.4 % (ref 36.0–46.0)
Hemoglobin: 15.6 g/dL — ABNORMAL HIGH (ref 12.0–15.0)
LYMPHS PCT: 25 %
Lymphs Abs: 3 10*3/uL (ref 0.7–4.0)
MCH: 31.1 pg (ref 26.0–34.0)
MCHC: 34.4 g/dL (ref 30.0–36.0)
MCV: 90.4 fL (ref 78.0–100.0)
MONO ABS: 1.1 10*3/uL — AB (ref 0.1–1.0)
MONOS PCT: 9 %
Neutro Abs: 7.5 10*3/uL (ref 1.7–7.7)
Neutrophils Relative %: 65 %
PLATELETS: 402 10*3/uL — AB (ref 150–400)
RBC: 5.02 MIL/uL (ref 3.87–5.11)
RDW: 13.6 % (ref 11.5–15.5)
WBC: 11.7 10*3/uL — ABNORMAL HIGH (ref 4.0–10.5)

## 2016-09-29 LAB — C DIFFICILE QUICK SCREEN W PCR REFLEX
C Diff antigen: NEGATIVE
C Diff interpretation: NOT DETECTED
C Diff toxin: NEGATIVE

## 2016-09-29 LAB — URINALYSIS, ROUTINE W REFLEX MICROSCOPIC
Bacteria, UA: NONE SEEN
Bilirubin Urine: NEGATIVE
GLUCOSE, UA: NEGATIVE mg/dL
HGB URINE DIPSTICK: NEGATIVE
KETONES UR: NEGATIVE mg/dL
Leukocytes, UA: NEGATIVE
NITRITE: NEGATIVE
PH: 7 (ref 5.0–8.0)
PROTEIN: 30 mg/dL — AB
Specific Gravity, Urine: 1.015 (ref 1.005–1.030)

## 2016-09-29 LAB — I-STAT BETA HCG BLOOD, ED (MC, WL, AP ONLY): I-stat hCG, quantitative: <5 m[IU]/mL (ref <5)

## 2016-09-29 MED ORDER — FOLIC ACID 5 MG/ML IJ SOLN
1.0000 mg | Freq: Every day | INTRAMUSCULAR | Status: DC
  Administered 2016-09-30 – 2016-10-03 (×4): 1 mg via INTRAVENOUS
  Filled 2016-09-29 (×5): qty 0.2

## 2016-09-29 MED ORDER — LORAZEPAM 1 MG PO TABS
1.0000 mg | ORAL_TABLET | Freq: Once | ORAL | Status: AC
  Administered 2016-09-29: 1 mg via ORAL
  Filled 2016-09-29: qty 1

## 2016-09-29 MED ORDER — DEXTROSE-NACL 5-0.9 % IV SOLN
INTRAVENOUS | Status: DC
  Administered 2016-09-29: 16:00:00 via INTRAVENOUS

## 2016-09-29 MED ORDER — PANTOPRAZOLE SODIUM 40 MG IV SOLR
40.0000 mg | Freq: Two times a day (BID) | INTRAVENOUS | Status: DC
  Administered 2016-09-29 – 2016-10-03 (×9): 40 mg via INTRAVENOUS
  Filled 2016-09-29 (×9): qty 40

## 2016-09-29 MED ORDER — THIAMINE HCL 100 MG/ML IJ SOLN
100.0000 mg | Freq: Every day | INTRAMUSCULAR | Status: DC
  Administered 2016-09-29 – 2016-10-02 (×4): 100 mg via INTRAVENOUS
  Filled 2016-09-29 (×4): qty 2

## 2016-09-29 MED ORDER — ACETAMINOPHEN 650 MG RE SUPP: 650.0000 mg | Freq: Four times a day (QID) | RECTAL | Status: DC | PRN

## 2016-09-29 MED ORDER — SODIUM CHLORIDE 0.9 % IV BOLUS (SEPSIS)
2000.0000 mL | Freq: Once | INTRAVENOUS | Status: AC
  Administered 2016-09-29: 2000 mL via INTRAVENOUS

## 2016-09-29 MED ORDER — METRONIDAZOLE IN NACL 5-0.79 MG/ML-% IV SOLN
500.0000 mg | Freq: Three times a day (TID) | INTRAVENOUS | Status: DC
  Administered 2016-09-29 – 2016-10-01 (×7): 500 mg via INTRAVENOUS
  Filled 2016-09-29 (×7): qty 100

## 2016-09-29 MED ORDER — ONDANSETRON HCL 4 MG/2ML IJ SOLN
4.0000 mg | Freq: Four times a day (QID) | INTRAMUSCULAR | Status: DC | PRN
  Administered 2016-09-29 – 2016-10-03 (×9): 4 mg via INTRAVENOUS
  Filled 2016-09-29 (×9): qty 2

## 2016-09-29 MED ORDER — SODIUM CHLORIDE 0.9 % IV SOLN
INTRAVENOUS | Status: DC
  Administered 2016-09-29: 14:00:00 via INTRAVENOUS
  Administered 2016-09-29: 1000 mL via INTRAVENOUS
  Administered 2016-09-30: 08:00:00 via INTRAVENOUS

## 2016-09-29 MED ORDER — PROMETHAZINE HCL 25 MG/ML IJ SOLN: 25.0000 mg | Freq: Four times a day (QID) | INTRAMUSCULAR | Status: DC | PRN

## 2016-09-29 MED ORDER — MORPHINE SULFATE (PF) 4 MG/ML IV SOLN
1.0000 mg | INTRAVENOUS | Status: DC | PRN
  Administered 2016-09-30 – 2016-10-03 (×7): 2 mg via INTRAVENOUS
  Administered 2016-10-03 – 2016-10-10 (×24): 4 mg via INTRAVENOUS
  Administered 2016-10-10: 2 mg via INTRAVENOUS
  Administered 2016-10-10 – 2016-10-11 (×8): 4 mg via INTRAVENOUS
  Filled 2016-09-29 (×41): qty 1

## 2016-09-29 MED ORDER — OLANZAPINE 5 MG PO TBDP
5.0000 mg | ORAL_TABLET | Freq: Once | ORAL | Status: DC
  Filled 2016-09-29: qty 1

## 2016-09-29 MED ORDER — ACETAMINOPHEN 325 MG PO TABS
650.0000 mg | ORAL_TABLET | Freq: Four times a day (QID) | ORAL | Status: DC | PRN
  Filled 2016-09-29: qty 2

## 2016-09-29 MED ORDER — ONDANSETRON HCL 4 MG/2ML IJ SOLN
4.0000 mg | Freq: Once | INTRAMUSCULAR | Status: AC
  Administered 2016-09-29: 4 mg via INTRAVENOUS
  Filled 2016-09-29: qty 2

## 2016-09-29 MED ORDER — ENOXAPARIN SODIUM 40 MG/0.4ML ~~LOC~~ SOLN
40.0000 mg | SUBCUTANEOUS | Status: DC
  Filled 2016-09-29: qty 0.4

## 2016-09-29 MED ORDER — LEVOTHYROXINE SODIUM 100 MCG IV SOLR
75.0000 ug | Freq: Every day | INTRAVENOUS | Status: DC
  Administered 2016-09-29 – 2016-09-30 (×2): 75 ug via INTRAVENOUS
  Filled 2016-09-29 (×2): qty 5

## 2016-09-29 NOTE — ED Provider Notes (Signed)
[facility] DEPT Provider Note   CSN: [id number] Arrival date & time: 09/29/16  1027     History   Chief Complaint Chief Complaint  Patient presents with  . Vomiting  . Diarrhea    HPI Brandi Love is a 41 y.o. female.  41 year old female presents with several day history of watery diarrhea as well as emesis. Recently diagnosed with urosepsis and discharged from the hospital 2 days ago. Urinalysis at that time was negative. She also has a history of eating disorder as well. States that she cannot keep anything down his had some associated weakness. Denies any fever or chills. Has had some dark urine but denies any dysuria or hematuria. Symptoms progressively worse and makes them better. No treatment use prior to arrival.      Past Medical History:  Diagnosis Date  . Anemia   . Anxiety   . Arthritis    "hands" (09/18/2016)  . Chronic lower back pain   . Chronic renal insufficiency   . Deafness   . Depression   . Diabetes ([place])    "borderline"  . Eating disorder   . Endometriosis   . GERD (gastroesophageal reflux disease)   . History of kidney stones   . Hyperlipidemia   . Hypertension   . 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 Migraine    "frequency varies" (09/18/2016)  . OCD (obsessive compulsive disorder)   . Pneumonia 2011  . PONV (postoperative nausea and vomiting)   . Pulmonary embolism ([place]) 10/2001  . Seizures ([place]) 09/2001 X 1   "day after my hysterectomy"  . Tachycardia     Patient Active Problem List   Diagnosis Date Noted  . Sepsis ([place]) 09/18/2016  . Acute pyelonephritis 09/18/2016  . MDD (major depressive disorder), recurrent, severe, with psychosis ([place]) 12/11/2014  . Eating disorder, unspecified 11/23/2012  . Hypothyroidism 11/23/2012  . Dyslipidemia 11/23/2012  . Sinus tachycardia 11/23/2012  . CKD (chronic kidney disease) stage 3, GFR 30-59 ml/min 11/18/2012  . Protein-calorie malnutrition, severe ([place]) 11/13/2012  .  Abdominal pain, epigastric 11/12/2012  . Dehydration 11/12/2012  . Acute on chronic renal failure ([place]) 11/12/2012  . Leukocytosis, unspecified 11/12/2012  . Hyponatremia 11/12/2012  . Borderline personality disorder 11/12/2012    Past Surgical History:  Procedure Laterality Date  . ABDOMINAL HYSTERECTOMY  09/2001   'except for right ovary'  . BREAST LUMPECTOMY Left 2000   benign  . BREAST LUMPECTOMY Right 2002   benign  . COCHLEAR IMPLANT Bilateral 2004-2010   "right-left"  . EYE MUSCLE SURGERY Left 1980   "lazy eye"  . EYE MUSCLE SURGERY Right 1990   "lazy eye"  . LAPAROSCOPIC ABDOMINAL EXPLORATION  2001 and 2002   for endometriosis    OB History    No data available       Home Medications    Prior to Admission medications   Medication Sig Start Date End Date Taking? Authorizing Provider  aspirin EC 81 MG tablet Take 81 mg by mouth at bedtime.     [provider]  atorvastatin (LIPITOR) 20 MG tablet Take 20 mg by mouth daily.    [provider]  cetirizine (ZYRTEC) 10 MG tablet Take 10 mg by mouth daily.    [provider]  cyclobenzaprine (FLEXERIL) 10 MG tablet Take 1 tablet (10 mg total) by mouth 2 (two) times daily as needed for muscle spasms. 07/17/15   Mackuen, Courteney Lyn, MD  docusate sodium (COLACE) 100  MG capsule Take 100 mg by mouth 2 (two) times daily.    [provider]  haloperidol (HALDOL) 5 MG tablet Take 5 mg by mouth at bedtime.     [provider]  hydrOXYzine (ATARAX/VISTARIL) 50 MG tablet Take 50 mg by mouth 3 (three) times daily as needed.    [provider]  lamoTRIgine (LAMICTAL) 200 MG tablet Take 200 mg by mouth daily.    [provider]  levothyroxine (SYNTHROID, LEVOTHROID) 150 MCG tablet Take 150 mcg by mouth daily.    [provider]  lurasidone (LATUDA) 20 MG TABS tablet Take 40 mg by mouth.     [provider]  magnesium oxide (MAG-OX) 400 MG tablet Take  400 mg by mouth 2 (two) times daily.     [provider]  Melatonin 10 MG TABS Take 10 mg by mouth at bedtime.    [provider]  metoprolol succinate (TOPROL-XL) 50 MG 24 hr tablet Take 25 mg by mouth every evening.     [provider]  Multiple Vitamin (MULTIVITAMIN WITH MINERALS) TABS Take 1 tablet by mouth daily.    [provider]  ondansetron (ZOFRAN) 4 MG tablet Take 4 mg by mouth every 8 (eight) hours as needed for nausea.    [provider]  pantoprazole (PROTONIX) 40 MG tablet Take 40 mg by mouth daily.    [provider]  promethazine (PHENERGAN) 25 MG tablet Take 1 tablet (25 mg total) by mouth every 6 (six) hours as needed for nausea or vomiting. 06/24/14   Alvina Chou, PA-C  QUEtiapine Fumarate (SEROQUEL XR) 150 MG 24 hr tablet Take 150 mg by mouth at bedtime.    [provider]  SUMAtriptan (IMITREX) 100 MG tablet Take 100 mg by mouth every 2 (two) hours as needed for migraine.    [provider]  topiramate (TOPAMAX) 50 MG tablet Take 50 mg by mouth 2 (two) times daily.     [provider]  venlafaxine XR (EFFEXOR-XR) 150 MG 24 hr capsule Take 150 mg by mouth daily with breakfast.    [provider]  Vitamin D, Ergocalciferol, (DRISDOL) 50000 UNITS CAPS Take 50,000 Units by mouth every 7 (seven) days. On Sunday    [provider]    Family History Family History  Problem Relation Age of Onset  . Cancer Father   . Hyperlipidemia Father   . Osteoarthritis Mother   . Hyperlipidemia Mother     Social History Social History  Substance Use Topics  . Smoking status: Never Smoker  . Smokeless tobacco: Never Used  . Alcohol use No     Allergies   Dhea [nutritional supplements]; Entex lq [phenylephrine-guaifenesin]; Indomethacin; Iodinated diagnostic agents; Propoxyphene; Risperidone and related; Shellfish allergy; Depakote [divalproex sodium]; Abilify [aripiprazole];  Dihydroergotamine; Doxycycline; Erythromycin; Lactose intolerance (gi); Nsaids; and Tape   Review of Systems Review of Systems  All other systems reviewed and are negative.    Physical Exam Updated Vital Signs BP 109/90   Pulse (!) 127   Temp 98.5 F (36.9 C) (Oral)   Resp 17   SpO2 96%   Physical Exam  Constitutional: She is oriented to person, place, and time. She appears well-developed and well-nourished.  Non-toxic appearance. No distress.  HENT:  Head: Normocephalic and atraumatic.  Eyes: Conjunctivae, EOM and lids are normal. Pupils are equal, round, and reactive to light.  Neck: Normal range of motion. Neck supple. No tracheal deviation present. No thyroid mass present.  Cardiovascular: Normal rate, regular rhythm and normal heart sounds.  Exam reveals no gallop.   No murmur heard. Pulmonary/Chest: Effort normal and breath sounds normal. No stridor. No respiratory distress. She has no decreased breath sounds. She has no wheezes. She has no rhonchi. She has no rales.  Abdominal: Soft. Normal appearance and bowel sounds are normal. She exhibits no distension. There is no tenderness. There is no rebound and no CVA tenderness.  Musculoskeletal: Normal range of motion. She exhibits no edema or tenderness.  Neurological: She is alert and oriented to person, place, and time. She has normal strength. No cranial nerve deficit or sensory deficit. GCS eye subscore is 4. GCS verbal subscore is 5. GCS motor subscore is 6.  Skin: Skin is warm and dry. No abrasion and no rash noted.  Psychiatric: Her speech is normal and behavior is normal. Her affect is blunt.  Nursing note and vitals reviewed.    ED Treatments / Results  Labs (all labs ordered are listed, but only abnormal results are displayed) Labs Reviewed  CBC WITH DIFFERENTIAL/PLATELET - Abnormal; Notable for the following:       Result Value   WBC 11.7 (*)    Hemoglobin 15.6 (*)    Platelets 402 (*)    Monocytes Absolute  1.1 (*)    All other components within normal limits  GASTROINTESTINAL PANEL BY PCR, STOOL (REPLACES STOOL CULTURE)  C DIFFICILE QUICK SCREEN W PCR REFLEX  URINALYSIS, ROUTINE W REFLEX MICROSCOPIC  BASIC METABOLIC PANEL    EKG  EKG Interpretation None       Radiology No results found.  Procedures Procedures (including critical care time)  Medications Ordered in ED Medications  sodium chloride 0.9 % bolus 2,000 mL (not administered)  0.9 %  sodium chloride infusion (not administered)  ondansetron (ZOFRAN) injection 4 mg (not administered)     Initial Impression / Assessment and Plan / ED Course  I have reviewed the triage vital signs and the nursing notes.  Pertinent labs & imaging results that were available during my care of the patient were reviewed by me and considered in my medical decision making (see chart for details).     Patient hydrated here with 2 L of saline. Has evidence of acute kidney injury. Will be admitted for dehydration. Stool cultures ordered as well as C. difficile  Final Clinical Impressions(s) / ED Diagnoses   Final diagnoses:  None    New Prescriptions New Prescriptions   No medications on file     Lacretia Leigh, MD 09/29/16 1253

## 2016-09-29 NOTE — ED Triage Notes (Signed)
Pt here for N/V/D; pt discharged Friday from here for pylo; pt sts unable to hold down liquids

## 2016-09-29 NOTE — Progress Notes (Signed)
Patient refused zyprexa and decided to  Take the ativan. I sat with her and  Talked several minutes.  She was telling me how she became a cutter, why she stopped eating, etc.  After several minutes, she agreed the ativan had helped her. Will continue to monitor patient. At this moment she denies suicidal ideations. Bed alarm is on.  Safety maintained.

## 2016-09-29 NOTE — Progress Notes (Signed)
Received call back from Dr Myna Hidalgo.  Will not administer Xanax: same class as ativan. He will place an order for zyprexa which will disolve under tongue. Will offer this to patient.

## 2016-09-29 NOTE — Progress Notes (Addendum)
CSW received a call from pt's NP regarding consult for pt, describing need for IVC.  NP stated at this time IVC not needed as family has hope for placement in eating disorder faiclity and that plan is to provide nourishment to the pt in hopes of pt regaining her strength.  NP will adjust consult so CSW on pt's floor is alerted pt has returned.  Please reconsult if future social work needs arise.    Alphonse Guild. Jaeanna Mccomber, [place], LCAS Clinical Social Worker Ph: [phone]

## 2016-09-29 NOTE — ED Notes (Signed)
Per Ebony Hail, NP, pt to get NG tube with [facility] tomorrow with radiology d/t NG tube needs to be placed post pyloric.

## 2016-09-29 NOTE — Progress Notes (Signed)
Received bedside report at change of shift. Patient is being admitted with N/V with AKI. The plan is to place NG tube tomorrow by IR. Patient is crying/ adamant about not wanting NG tube. She also states she needs something for anxiety. We got an order for ativan, she refused saying she cannot take ativan, it doesn't work. She can't take klonopin, haldol and other meds. She did say, however, "my therapist said Xanax might help" She read this from notes on her phone. She is deaf with a cochlear implant and reads lips. I will page on call with new update.                                                                                                                                                                              Brandi Love, Brandi Love "can't take ativan, klonopin, haldol, seroquel. Reports her therapist state perhaps Xanax. Does NOT want NG tube placement. TY                Awaiting to hear from MD.

## 2016-09-29 NOTE — H&P (Signed)
History and Physical    Brandi Love QQI:[id number] DOB: 10-Nov-1975 DOA: 09/29/2016   PCP: Vickii Penna, MD   Patient coming from/Resides with: Private residence/lives alone  Admission status: Inpatient/telemetry -medically necessary to stay a minimum 2 midnights to rule out impending and/or unexpected changes in physiologic status that may differ from initial evaluation performed in the ER and/or at time of admission. Presents with intractable nausea and vomiting and setting of known severe eating disorder and inability to eat for nearly 30 days. Also reporting diffuse abdominal cramping and bloody diarrhea since discharge from hospital with subjective fevers and chills. Differential does include C. difficile colitis versus colitis secondary to severe protein calorie malnutrition/starvation-ketogenesis. Patient will require telemetry monitoring especially after refeeding, frequent labs to monitor for refeeding syndrome and other electrolyte abnormalities, IV medications until proves tolerant of tube feedings, placement of postpyloric feeding tube and fluoroscopy by radiology. She also will require inpatient nutrition evaluation. She will also require inpatient social worker evaluation noting a plan has been in place to have the patient admitted to an eating disorder specific facility which hopefully can be accomplished when she is medically stable for discharge during this admission.  Chief Complaint: Intractable nausea and vomiting  HPI: Brandi Love is a 41 y.o. female with medical history significant for congenital hypothyroidism, hypertriglyceridemia with dyslipidemia, major depressive disorder with OCD and severe eating disorder manifested by bulimia and history of laxative abuse. She also has a history of endometriosis status post partial hysterectomy, reflux, hypertension, remote PE, and genital deafness, stage III chronic kidney disease and overactive bladder as well as anemia of chronic  disease. She was recently discharged on 6/8 And admission for sepsis related to pyelonephritis. During that hospitalization she also required short-term IVC due to exacerbation of her underlying psychiatric conditions manifested by auditory hallucinations associated with limited insight and judgment and recurrent agitation. According to the psychiatric note she has been diagnosed with Moreland personality disorder and told the intake psychiatrist that she desired to lose another 30 pounds by restricting her diet. She endorses a desire to improve in her psychiatric condition but was reluctant to take her prescribed psychotropic medications and was reluctant to eat. By time of discharge she was not exhibiting suicidal or homicidal ideation and IVC was removed. She was not eating but was deemed medically and psychiatrically stable to discharge and had plans in place to follow-up with multiple psychiatric practitioners including social worker, physician and nutritionist. Of note patient was not experiencing any abdominal pain or bloody stools prior to discharge. She reports that since discharge she still has not eaten anything and has not been at a keep liquids down stating it has now been 27 days since she has had any oral intake. She attempted to eat a pretty lean at 4 AM on Saturday but had immediate emesis. She has developed diffuse abdominal cramping with reports of bloody and mucoid stools. She has not had any stool since arrival to the ER. She does not use NSAIDs. She reports similar symptoms in 2017. In review of the medical record she underwent a colonoscopy that revealed small internal hemorrhoids and a small anal fissure but no other significant abnormalities. At the patient's insistence I called her feeding disorders nutritionist who relayed that her recommendation was for the patient to have short-term feeding tube placed to assist with nutrition and electrolyte replacement. In the ER she was found to have  acute kidney injury, subtle leukocytosis, obvious signs of dehydration and metabolic  acidemia. Her urinalysis was unremarkable.  ED Course:  Vital Signs: BP 112/80   Pulse (!) 102   Temp 98.5 F (36.9 C) (Oral)   Resp 17   SpO2 96%  Lab data: C. difficile PCR and gastrointestinal PCR panel ordered by ED PE but has not yet been collected secondary to no bowel movement since arrival; sodium 137, potassium 4.2, chloride 104, CO2 18, glucose 141, BUN 29, creatinine 2.2, calcium 11.1, anion gap 15, white count 11,700 with normal differential, hemoglobin 15.6, platelets 402,000 Medications and treatments: Normal saline bolus 2 L, Zofran 4 mg IV 1  Review of Systems:  In addition to the HPI above,  No Headache, changes with Vision or hearing, new weakness, tingling, numbness in any extremity, dizziness, dysarthria or word finding difficulty, gait disturbance or imbalance, tremors or seizure activity No problems swallowing food or Liquids, indigestion/reflux, choking or coughing while eating, abdominal pain with or after eating No Chest pain, Cough or Shortness of Breath, palpitations, orthopnea or DOE No dysuria, malodorous urine, hematuria or flank pain No new skin rashes, lesions, masses or bruises, No new joint pains, aches, swelling or redness No recent unintentional weight gain or loss No polyuria, polydypsia or polyphagia   Past Medical History:  Diagnosis Date  . Anemia   . Anxiety   . Arthritis    "hands" (09/18/2016)  . Chronic lower back pain   . Chronic renal insufficiency   . Deafness   . Depression   . Diabetes ([place])    "borderline"  . Eating disorder   . Endometriosis   . GERD (gastroesophageal reflux disease)   . History of kidney stones   . Hyperlipidemia   . Hypertension   . 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 Migraine    "frequency varies" (09/18/2016)  . OCD (obsessive compulsive disorder)   . Pneumonia 2011  . PONV (postoperative nausea and  vomiting)   . Pulmonary embolism ([place]) 10/2001  . Seizures ([place]) 09/2001 X 1   "day after my hysterectomy"  . Tachycardia     Past Surgical History:  Procedure Laterality Date  . ABDOMINAL HYSTERECTOMY  09/2001   'except for right ovary'  . BREAST LUMPECTOMY Left 2000   benign  . BREAST LUMPECTOMY Right 2002   benign  . COCHLEAR IMPLANT Bilateral 2004-2010   "right-left"  . EYE MUSCLE SURGERY Left 1980   "lazy eye"  . EYE MUSCLE SURGERY Right 1990   "lazy eye"  . LAPAROSCOPIC ABDOMINAL EXPLORATION  2001 and 2002   for endometriosis    Social History   Social History  . Marital status: Single    Spouse name: N/A  . Number of children: 0  . Years of education: 14   Occupational History  . Disabled.    Social History Main Topics  . Smoking status: Never Smoker  . Smokeless tobacco: Never Used  . Alcohol use No  . Drug use: No  . Sexual activity: No   Other Topics Concern  . Not on file   Social History Narrative   Lives alone.  Single.  No family in town.      Mobility: Independent Work history: Disabled   Allergies  Allergen Reactions  . Dhea [Nutritional Supplements] Nausea And Vomiting  . Entex Lq [Phenylephrine-Guaifenesin] Shortness Of Breath and Swelling    Throat swelling  . Indomethacin Nausea And Vomiting  . Iodinated Diagnostic Agents Anaphylaxis    'cant breathe'  . Melatonin Itching  .  Propoxyphene Anaphylaxis  . Risperidone And Related Other (See Comments)    Out of body experience  . Shellfish Allergy Other (See Comments)    unspecified  . Depakote [Divalproex Sodium] Nausea And Vomiting and Other (See Comments)    Weight gain  . Abilify [Aripiprazole] Nausea And Vomiting  . Dihydroergotamine Nausea And Vomiting  . Doxycycline Nausea And Vomiting  . Erythromycin Nausea And Vomiting  . Lactose Intolerance (Gi) Nausea And Vomiting  . Nsaids Other (See Comments)    Unable to ttake due to renal insufficency  . Tape Other (See  Comments)    Redness, can use paper tape    Family History  Problem Relation Age of Onset  . Cancer Father   . Hyperlipidemia Father   . Osteoarthritis Mother   . Hyperlipidemia Mother     Prior to Admission medications   Medication Sig Start Date End Date Taking? Authorizing Provider  lamoTRIgine (LAMICTAL) 200 MG tablet Take 200 mg by mouth daily.   Yes [provider]  aspirin EC 81 MG tablet Take 81 mg by mouth at bedtime.     [provider]  atorvastatin (LIPITOR) 20 MG tablet Take 20 mg by mouth daily.    [provider]  cetirizine (ZYRTEC) 10 MG tablet Take 10 mg by mouth daily.    [provider]  cyclobenzaprine (FLEXERIL) 10 MG tablet Take 1 tablet (10 mg total) by mouth 2 (two) times daily as needed for muscle spasms. 07/17/15   Mackuen, Courteney Lyn, MD  docusate sodium (COLACE) 100 MG capsule Take 100 mg by mouth 2 (two) times daily.    [provider]  haloperidol (HALDOL) 5 MG tablet Take 5 mg by mouth at bedtime.     [provider]  hydrOXYzine (ATARAX/VISTARIL) 50 MG tablet Take 50 mg by mouth 3 (three) times daily as needed.    [provider]  levothyroxine (SYNTHROID, LEVOTHROID) 150 MCG tablet Take 150 mcg by mouth daily.    [provider]  lurasidone (LATUDA) 20 MG TABS tablet Take 20 mg by mouth daily.     [provider]  magnesium oxide (MAG-OX) 400 MG tablet Take 400 mg by mouth 2 (two) times daily.     [provider]  Melatonin 10 MG TABS Take 10 mg by mouth at bedtime.    [provider]  metoprolol succinate (TOPROL-XL) 50 MG 24 hr tablet Take 25 mg by mouth every evening.     [provider]  Multiple Vitamin (MULTIVITAMIN WITH MINERALS) TABS Take 1 tablet by mouth daily.    [provider]  ondansetron (ZOFRAN) 4 MG tablet Take 4 mg by mouth every 8 (eight) hours as needed for nausea.    [provider]  pantoprazole (PROTONIX)  40 MG tablet Take 40 mg by mouth daily.    [provider]  promethazine (PHENERGAN) 25 MG tablet Take 1 tablet (25 mg total) by mouth every 6 (six) hours as needed for nausea or vomiting. 06/24/14   Alvina Chou, PA-C  QUEtiapine Fumarate (SEROQUEL XR) 150 MG 24 hr tablet Take 150 mg by mouth at bedtime.    [provider]  SUMAtriptan (IMITREX) 100 MG tablet Take 100 mg by mouth every 2 (two) hours as needed for migraine.    [provider]  topiramate (TOPAMAX) 50 MG tablet Take 50 mg by mouth 2 (two) times daily.     [provider]  venlafaxine XR (EFFEXOR-XR) 150 MG 24  hr capsule Take 150 mg by mouth daily with breakfast.    [provider]  Vitamin D, Ergocalciferol, (DRISDOL) 50000 UNITS CAPS Take 50,000 Units by mouth every 7 (seven) days. On Sunday    [provider]    Physical Exam: Vitals:   09/29/16 1315 09/29/16 1330 09/29/16 1345 09/29/16 1400  BP: 116/85 109/79 103/87 112/80  Pulse: (!) 105 99 (!) 101 (!) 102  Resp:      Temp:      TempSrc:      SpO2: 95% 96% 100% 96%      Constitutional: NAD, Anxious and restless and setting of IV placement and expressing fear of needles, appears pale and chronically ill Eyes: PERRL, lids and conjunctivae normal ENMT: Mucous membranes are dry Posterior pharynx clear of any exudate or lesions. Poor dentition. Bilateral hearing aids in place Neck: normal, supple, no masses, no thyromegaly Respiratory: clear to auscultation bilaterally, no wheezing, no crackles. Normal respiratory effort. No accessory muscle use.  Cardiovascular: Regular rate and rhythm, no murmurs / rubs / gallops. No extremity edema. 2+ pedal pulses. No carotid bruits.  Abdomen: Diffuse tenderness that guarding or rebounding and abdomen is otherwise soft, no masses palpated. No hepatosplenomegaly. Bowel sounds positive. Rectal exam deferred due to underlying agitation. Musculoskeletal: no clubbing / cyanosis. No  joint deformity upper and lower extremities. Good ROM, no contractures. Slightly diminished bilateral muscle tone.  Skin: no rashes, lesions, ulcers. No induration Neurologic: CN 2-12 grossly intact. Sensation intact, DTR equivocal. Strength 5/5 x all 4 extremities. Patient able to sit self up in bed without assistance Psychiatric: Poor judgment and insight. At times is quite childlike. Becomes very agitated and restless and flexes at the hips and a partial fetal position when stressed. Able to verbalize fear of needles. Repeatedly asked me to contact her eating disorders nutritionist and let her know of the admission. Alert and oriented x 3.    Labs on Admission: I have personally reviewed following labs and imaging studies  CBC:  Recent Labs Lab 09/29/16 1142  WBC 11.7*  NEUTROABS 7.5  HGB 15.6*  HCT 45.4  MCV 90.4  PLT 147*   Basic Metabolic Panel:  Recent Labs Lab 09/29/16 1142  NA 137  K 4.2  CL 104  CO2 18*  GLUCOSE 141*  BUN 29*  CREATININE 2.20*  CALCIUM 11.1*   GFR: Estimated Creatinine Clearance: 33.6 mL/min (A) (by C-G formula based on SCr of 2.2 mg/dL (H)). Liver Function Tests: No results for input(s): AST, ALT, ALKPHOS, BILITOT, PROT, ALBUMIN in the last 168 hours. No results for input(s): LIPASE, AMYLASE in the last 168 hours. No results for input(s): AMMONIA in the last 168 hours. Coagulation Profile: No results for input(s): INR, PROTIME in the last 168 hours. Cardiac Enzymes: No results for input(s): CKTOTAL, CKMB, CKMBINDEX, TROPONINI in the last 168 hours. BNP (last 3 results) No results for input(s): PROBNP in the last 8760 hours. HbA1C: No results for input(s): HGBA1C in the last 72 hours. CBG:  Recent Labs Lab 09/23/16 2136  GLUCAP 108*   Lipid Profile: No results for input(s): CHOL, HDL, LDLCALC, TRIG, CHOLHDL, LDLDIRECT in the last 72 hours. Thyroid Function Tests: No results for input(s): TSH, T4TOTAL, FREET4, T3FREE, THYROIDAB in  the last 72 hours. Anemia Panel: No results for input(s): VITAMINB12, FOLATE, FERRITIN, TIBC, IRON, RETICCTPCT in the last 72 hours. Urine analysis:    Component Value Date/Time   COLORURINE YELLOW 09/27/2016 [place] 09/27/2016 0954  LABSPEC 1.013 09/27/2016 0954   PHURINE 6.0 09/27/2016 0954   GLUCOSEU NEGATIVE 09/27/2016 0954   HGBUR NEGATIVE 09/27/2016 0954   BILIRUBINUR NEGATIVE 09/27/2016 [place] 09/27/2016 0954   PROTEINUR NEGATIVE 09/27/2016 0954   UROBILINOGEN 0.2 12/14/2012 0816   NITRITE NEGATIVE 09/27/2016 0954   LEUKOCYTESUR NEGATIVE 09/27/2016 0954   Sepsis Labs: @LABRCNTIP (procalcitonin:4,lacticidven:4) ) Recent Results (from the past 240 hour(s))  Culture, Urine     Status: None   Collection Time: 09/20/16 12:42 PM  Result Value Ref Range Status   Specimen Description URINE, RANDOM  Final   Special Requests NONE  Final   Culture NO GROWTH  Final   Report Status 09/21/2016 FINAL  Final  MRSA PCR Screening     Status: Abnormal   Collection Time: 09/22/16  7:47 AM  Result Value Ref Range Status   MRSA by PCR POSITIVE (A) NEGATIVE Final    Comment:        The GeneXpert MRSA Assay (FDA approved for NASAL specimens only), is one component of a comprehensive MRSA colonization surveillance program. It is not intended to diagnose MRSA infection nor to guide or monitor treatment for MRSA infections. RESULT CALLED TO, READ BACK BY AND VERIFIED WITH: Candie Chroman, RN AT [phone] 09/22/16 BY L BENFIELD      Radiological Exams on Admission: No results found.   Assessment/Plan Principal Problem:   Acute kidney injury/CKD (chronic kidney disease), stage III -Presents with intractable nausea and vomiting and reports of bloody diarrhea in setting of poor oral intake chronically relating to eat a disorder and reports of no oral intake for at least 27 days although did receive IV fluids during recent hospitalization and was discharged on  6/8 -Baseline renal function: 16 and 1.21 -Current renal function: 29 and 2.20 -Vigorous IV fluid hydration -Follow labs -Treat underlying causes  Active Problems:   Bloody diarrhea -Patient reports onset after discharge although no bowel movement since arrival -Recent antibiotics so checked for C. difficile colitis and check gastrointestinal PCR panel -Check abdominal x-ray -Empiric Flagyl -Morphine for pain -Clear liquids until diarrhea and pain resolved (see below regarding placement of feeding tube)    Eating disorder/MDD (major depressive disorder)/ OCD (obsessive compulsive disorder)/Moreland personality disorder -Evaluated by psychiatrist previous admission due to poor insight, auditory hallucinations and agitation-those symptoms have resolved -Documented reluctance in the past to take psychotropic medications despite professed desire to improve-currently unable to keep liquids food or medications down -No acute psychiatric needs at this juncture but we'll need to evaluate during hospitalization -Social work consulted; apparently facility search in progress regarding placement at an inpatient eating disorders facility -Likely can resume psych medications once feeding tube placed (see below): Patient was on Haldol, Latuda, Seroquel, Topamax and effects or prior to admission -Discussed with patient as well as her parents that if patient medically stable for discharge and eating disorder facility without available opening that patient may be best served by not discharging home alone and instead having family member stay with her or she should transiently live with her family until bed available.    Intractable nausea and vomiting/Severe dehydration with Severe protein-calorie malnutrition  -In setting of underlying severe eating disorder with history of laxative abuse and apparent bulimia without anorexia -Patient has been unable to tolerate oral liquids, solids or medications for at  least 27 days -I spoke at length with her outpatient eating disorders nutritionist's recommendation was to begin tube feedings -We will have postpyloric feeding tube placed under  fluoroscopy in radiology on 6/11-order placed in Epic-clear liquids ordered but it is likely patient will not tolerate given history -Nutrition insulted regarding appropriate tube feeding especially with postpyloric feeds -Monitor closely for refeeding syndrome including telemetry monitoring -Check baseline pre-albumin, magnesium and phosphorus -IV fluids as above -As chronic reflux so we'll begin Protonix IV twice a day -IV Zofran/Phenergan prn    Seizure disorder -On Lamictal at home    Hypertension -On Toprol at home -Current blood pressure suboptimal in setting of severe dehydration and malnutrition so will hold antihypertensive medication    Congenital hypothyroidism/ Deafness congenital -Followed by endocrinology at [facility] Synthroid to IV and check TSH -May have concomitant developmental delay which is worsening patient's neuropsychiatric problems    Endometriosis -Status post partial hysterectomy therefore not likely causing patient's current diffuse abdominal cramping    Hypertriglyceridemia/Hyperlipidemia -Prescribed Lipitor at home    Anemia -Baseline hemoglobin 10.8 with current hemoglobin 15.6 reflective of hemoconcentration from dehydration    OAB (overactive bladder)/dress urinary incontinence -As been evaluated by urology in the outpatient setting with recommendations for Kegel exercises; pessary recommended the patient did not try-other recommendation was for surgery with sling -Patient reports chronic bladder leakage and utilizes pads   Diabetes mellitus -Not on medications with most recent hemoglobin A1c documented 5.5 -HgbA1c -Mildly hyperglycemic-check a.m. CBGs for now -Suspect sugars well controlled in setting of poor oral intake-malnutrition      DVT prophylaxis:  Lovenox Code Status: Full  Family Communication: Parents  Disposition Plan: To be determined-home vs eating disorder facility Consults called: None     Taiwo Fish L. ANP-BC [facility] Pager [phone]   If 7PM-7AM, please contact night-coverage [email or web address] Password [facility]  09/29/2016, 2:39 PM

## 2016-09-30 ENCOUNTER — Inpatient Hospital Stay (HOSPITAL_COMMUNITY): Payer: Medicare Other

## 2016-09-30 ENCOUNTER — Ambulatory Visit: Payer: Self-pay | Admitting: *Deleted

## 2016-09-30 DIAGNOSIS — E785 Hyperlipidemia, unspecified: Secondary | ICD-10-CM

## 2016-09-30 DIAGNOSIS — R197 Diarrhea, unspecified: Secondary | ICD-10-CM

## 2016-09-30 DIAGNOSIS — E860 Dehydration: Secondary | ICD-10-CM

## 2016-09-30 DIAGNOSIS — N179 Acute kidney failure, unspecified: Secondary | ICD-10-CM

## 2016-09-30 DIAGNOSIS — H905 Unspecified sensorineural hearing loss: Secondary | ICD-10-CM

## 2016-09-30 LAB — GASTROINTESTINAL PANEL BY PCR, STOOL (REPLACES STOOL CULTURE)

## 2016-09-30 LAB — ECHOCARDIOGRAM COMPLETE
Height: 68 in
Weight: 2484.8 oz

## 2016-09-30 LAB — BASIC METABOLIC PANEL
Anion gap: 10 (ref 5–15)
BUN: 22 mg/dL — ABNORMAL HIGH (ref 6–20)
CALCIUM: 8.8 mg/dL — AB (ref 8.9–10.3)
CHLORIDE: 114 mmol/L — AB (ref 101–111)
CO2: 16 mmol/L — AB (ref 22–32)
CREATININE: 1.84 mg/dL — AB (ref 0.44–1.00)
GFR calc Af Amer: 39 mL/min — ABNORMAL LOW (ref >60)
GFR calc non Af Amer: 34 mL/min — ABNORMAL LOW (ref >60)
GLUCOSE: 107 mg/dL — AB (ref 65–99)
Potassium: 3.9 mmol/L (ref 3.5–5.1)
Sodium: 140 mmol/L (ref 135–145)

## 2016-09-30 LAB — GLUCOSE, CAPILLARY
GLUCOSE-CAPILLARY: 86 mg/dL (ref 65–99)
GLUCOSE-CAPILLARY: 85 mg/dL (ref 65–99)

## 2016-09-30 MED ORDER — ATORVASTATIN CALCIUM 20 MG PO TABS
20.0000 mg | ORAL_TABLET | Freq: Every day | ORAL | Status: DC
  Administered 2016-10-01 – 2016-10-07 (×7): 20 mg
  Filled 2016-09-30 (×7): qty 1

## 2016-09-30 MED ORDER — PHENOL 1.4 % MT LIQD
1.0000 | OROMUCOSAL | Status: DC | PRN
  Administered 2016-09-30: 1 via OROMUCOSAL
  Filled 2016-09-30: qty 177

## 2016-09-30 MED ORDER — VENLAFAXINE HCL ER 75 MG PO CP24
150.0000 mg | ORAL_CAPSULE | Freq: Every day | ORAL | Status: DC
  Administered 2016-10-01 – 2016-10-11 (×11): 150 mg via ORAL
  Filled 2016-09-30 (×11): qty 2

## 2016-09-30 MED ORDER — LEVOTHYROXINE SODIUM 75 MCG PO TABS
150.0000 ug | ORAL_TABLET | Freq: Every day | ORAL | Status: DC
  Administered 2016-10-01 – 2016-10-08 (×8): 150 ug
  Filled 2016-09-30 (×8): qty 2

## 2016-09-30 MED ORDER — QUETIAPINE FUMARATE ER 50 MG PO TB24
150.0000 mg | ORAL_TABLET | Freq: Every day | ORAL | Status: DC
  Administered 2016-10-03 – 2016-10-10 (×8): 150 mg via ORAL
  Filled 2016-09-30 (×14): qty 3

## 2016-09-30 MED ORDER — OSMOLITE 1.2 CAL PO LIQD
1000.0000 mL | ORAL | Status: DC
  Administered 2016-09-30 – 2016-10-07 (×6): 1000 mL
  Filled 2016-09-30 (×15): qty 1000

## 2016-09-30 MED ORDER — LAMOTRIGINE 100 MG PO TABS
200.0000 mg | ORAL_TABLET | Freq: Every day | ORAL | Status: DC
Start: 2016-09-30 — End: 2016-10-08
  Administered 2016-10-01 – 2016-10-08 (×7): 200 mg
  Filled 2016-09-30 (×9): qty 2

## 2016-09-30 MED ORDER — LORAZEPAM 1 MG PO TABS
1.0000 mg | ORAL_TABLET | Freq: Once | ORAL | Status: AC
  Administered 2016-10-01: 1 mg
  Filled 2016-09-30 (×2): qty 1

## 2016-09-30 MED ORDER — HYDROXYZINE HCL 25 MG PO TABS
50.0000 mg | ORAL_TABLET | Freq: Three times a day (TID) | ORAL | Status: DC | PRN
  Administered 2016-09-30 – 2016-10-05 (×4): 50 mg
  Filled 2016-09-30 (×5): qty 2

## 2016-09-30 MED ORDER — TOPIRAMATE 25 MG PO TABS
50.0000 mg | ORAL_TABLET | Freq: Two times a day (BID) | ORAL | Status: DC
  Administered 2016-10-01 – 2016-10-08 (×14): 50 mg
  Filled 2016-09-30 (×18): qty 2

## 2016-09-30 MED ORDER — SODIUM CHLORIDE 0.9 % IV SOLN
INTRAVENOUS | Status: AC
  Administered 2016-09-30 – 2016-10-01 (×2): via INTRAVENOUS

## 2016-09-30 MED ORDER — LORAZEPAM 2 MG/ML IJ SOLN
1.0000 mg | Freq: Once | INTRAMUSCULAR | Status: AC
  Administered 2016-09-30: 1 mg via INTRAVENOUS
  Filled 2016-09-30: qty 1

## 2016-09-30 MED ORDER — ADULT MULTIVITAMIN LIQUID CH
15.0000 mL | Freq: Every day | ORAL | Status: DC
  Administered 2016-10-01 – 2016-10-06 (×6): 15 mL
  Filled 2016-09-30 (×9): qty 15

## 2016-09-30 MED ORDER — LURASIDONE HCL 20 MG PO TABS
20.0000 mg | ORAL_TABLET | Freq: Every day | ORAL | Status: DC
  Administered 2016-10-01 – 2016-10-08 (×8): 20 mg
  Filled 2016-09-30 (×8): qty 1

## 2016-09-30 MED ORDER — MELATONIN 3 MG PO TABS
9.0000 mg | ORAL_TABLET | Freq: Every day | ORAL | Status: DC
  Administered 2016-10-03 – 2016-10-07 (×5): 9 mg
  Filled 2016-09-30 (×8): qty 3

## 2016-09-30 MED ORDER — HALOPERIDOL 5 MG PO TABS
5.0000 mg | ORAL_TABLET | Freq: Every day | ORAL | Status: DC
  Administered 2016-10-01 – 2016-10-07 (×7): 5 mg
  Filled 2016-09-30 (×8): qty 1

## 2016-09-30 MED ORDER — LORATADINE 10 MG PO TABS
10.0000 mg | ORAL_TABLET | Freq: Every day | ORAL | Status: DC
  Administered 2016-10-04 – 2016-10-08 (×4): 10 mg
  Filled 2016-09-30 (×7): qty 1

## 2016-09-30 MED ORDER — JEVITY 1.2 CAL PO LIQD: 1000.0000 mL | ORAL | Status: DC

## 2016-09-30 NOTE — Progress Notes (Signed)
Pt refuses to take any medication she considers to be psych meds. She also states she does not want to be woken up for q4 neuro checks, CBG monitoring, or vital signs during the night.

## 2016-09-30 NOTE — Progress Notes (Signed)
Spoke at length with patient. She shared how she started cutting herself. She shared that she's "been on every medication" She calmed down as I talked with her and asked if I would come back and talk again.

## 2016-09-30 NOTE — Progress Notes (Signed)
CSW worked with patient last admission and will continue to assist as needed with placement at eating disorder treatment center.  [employer] still has patient under review and plans to contact [facility] regarding single case agreement for admission into their program.  CSW continuing to follow  Jorge Ny, [place] Social Worker [phone]

## 2016-09-30 NOTE — Progress Notes (Signed)
Patient has slept through the later part of the night.

## 2016-09-30 NOTE — Progress Notes (Signed)
Initial Nutrition Assessment  DOCUMENTATION CODES:   Not applicable  INTERVENTION:   -Initiate Osmolite 1.2 @ 20 ml/hr via cortrak tube and increase by 10 ml every 12 hours to goal rate of 60 ml/hr.   Tube feeding regimen provides 1728 kcal (100% of needs), 80 grams of protein, and 1180 ml of H2O.   -Recommend monitor K, Mg, and Phos daily x 3 days, due to high refeeding risk  NUTRITION DIAGNOSIS:   Inadequate oral intake related to nausea, vomiting, chronic illness (eating disorder) as evidenced by meal completion < 25%, per patient/family report.  GOAL:   Patient will meet greater than or equal to 90% of their needs  MONITOR:   PO intake, Diet advancement, Labs, Weight trends, TF tolerance, Skin, I & O's  REASON FOR ASSESSMENT:   Consult, Malnutrition Screening Tool Enteral/tube feeding initiation and management  ASSESSMENT:   Brandi Love is a 41 y.o. female with medical history significant for congenital hypothyroidism, hypertriglyceridemia with dyslipidemia, major depressive disorder with OCD and severe eating disorder manifested by bulimia and history of laxative abuse. She also has a history of endometriosis status post partial hysterectomy, reflux, hypertension, remote PE, and genital deafness, stage III chronic kidney disease and overactive bladder as well as anemia of chronic disease. She was recently discharged on 6/8 And admission for sepsis related to pyelonephritis.  Pt admitted with AKI.   Pt sleeping soundly at time of visit. Per staff report, pt has been very anxious today. Nutrition-focused physical exam deferred, due to anxiety. Previous NFPE done on 09/20/16 from previous admission revealed no fat or muscle depletion nor evidence of micronutrient deficiencies.   Pt familiar to this writer from previous admission (discharged on 09/27/16). Pt continues to refuse food or have meal trays delivered to her room. Per outpatient ED RD notes, choices for oral intake  appear limited at baseline (safe foods include cereal, [employer], goldfish, and salad with light dressing). Per previous RD notes from prior hospitalization, pt consuming only Ensure Enlive (2-4 bottles daily) and occasional sips of fruit juice, approximately 980 kcals and 56 grams protein daily (meeting 59% of estimated kcal needs and 70% of estimated protein needs).   Reviewed wt hx; noted a 7.2% wt loss over the past month. Suspect some wt loss is likely related to dehydration, due to frequent nausea and vomiting.   Plan is for pt to receive medications and nutrition via feeding tube due to continued poor oral intake. Cortrak tube team placed tube earlier today; KUB confirms tip of tube at the ligament of Treitz.   Labs reviewed.   Diet Order:  Diet clear liquid Room service appropriate? Yes; Fluid consistency: Thin  Skin:  Reviewed, no issues  Last BM:  09/30/16  Height:   Ht Readings from Last 1 Encounters:  09/29/16 5\' 8"  (1.727 m)    Weight:   Wt Readings from Last 1 Encounters:  09/29/16 155 lb 4.8 oz (70.4 kg)    Ideal Body Weight:  63.6 kg  BMI:  Body mass index is 23.61 kg/m.  Estimated Nutritional Needs:   Kcal:  1650-1850  Protein:  80-95 grams  Fluid:  >1.6 L  EDUCATION NEEDS:   Education needs no appropriate at this time  Gadge Hermiz A. Jimmye Norman, RD, LDN, CDE Pager: [phone] After hours Pager: [phone]

## 2016-09-30 NOTE — Progress Notes (Signed)
  Echocardiogram 2D Echocardiogram has been performed.  Donata Clay 09/30/2016, 12:44 PM

## 2016-09-30 NOTE — Progress Notes (Addendum)
PROGRESS NOTE   Brandi Love  FUX:[id number]    DOB: 1975/10/29    DOA: 09/29/2016  PCP: Vickii Penna, MD   I have briefly reviewed patients previous medical records in [facility].  Brief Narrative:  41 year old female with PMH of anemia, anxiety and depression, chronic kidney disease, borderline DM, severe eating disorder/bulimia/laxative abuse history, OCD, GERD, HTN, HLD, congenital hypothyroid, hearing loss/cochlear implant, remote PE, seizure, recent hospitalization 09/18/16-09/27/16 for sepsis due to pyelonephritis, hypotension, acute psychosis and evaluated by psychiatry, needed short-term IVC, stabilized and discharged home, return to ED on 09/29/16 reporting that she had not eaten anything since that discharge (stated that she had not eaten for 27 days on admission), intractable nausea and vomiting, inability to keep anything by mouth, diffuse abdominal cramping and blood or mucus in stools. She had similar episode in 2017 when she underwent colonoscopy that revealed small internal hemorrhoids and a small anal fissure but no significant abnormalities. Admitted for intractable nausea, vomiting, associated dehydration, acute kidney injury and metabolic acidosis. Case discussed with her outpatient eating disorder dietitian and recommended tube feedings. Cor track placed and tube feeding initiated 6/11.   Assessment & Plan:   Principal Problem:   Acute kidney injury ([place]) Active Problems:   OCD (obsessive compulsive disorder)   Eating disorder   Deafness congenital   Intractable nausea and vomiting   Bloody diarrhea   AKI (acute kidney injury) ([place])   Severe dehydration   Severe protein-calorie malnutrition (H[facility])   Congenital hypothyroidism   Hypertriglyceridemia   Anemia   Endometriosis   MDD (major depressive disorder)   Hyperlipidemia   CKD (chronic kidney disease), stage III   OAB (overactive bladder)   1. Intractable nausea and vomiting: In the setting of severe  eating disorder, laxative abuse and apparent bulimia. As per patient's report, has been unable to tolerate any oral intake for 27 days on admission. Admitting team discussed with her outpatient eating disorder nutritionist who recommended tube feedings. Cor track was placed on 6/11 and continue postpyloric tube feeding. Continue IV PPI and when necessary IV antiemetics. Monitor closely. 2. Dehydration: Secondary to GI losses and poor oral intake. IV fluids and tube feeding. 3. Acute kidney injury complicating stage III chronic kidney disease: Baseline creatinine not known but may be in the 1.5-1.6. Presented with creatinine of 2.2. Secondary to dehydration. Continue IV fluids. Improved to 1.8. 4. Non-anion gap metabolic acidosis: Secondary to GI losses. IV fluids as above and follow BMP closely. 5. Blood and mucus in stools: Reviewed stool in bedside commode on 6/11 and no further blood noted. Continue to monitor. C. difficile testing negative. Abdominal x-ray without acute findings. Started empirically on Flagyl on admission,? DC 6. Psychiatry (severe eating disorder/anxiety/depression/OCD/borderline personality disorder): Evaluated by psychiatry during previous admission. Documented reluctance in the past to take psychotropic medications despite professed desire to improve. No acute psychiatric issues at this time. Resume medications via tube feeding. Social worker consulted for placement 20 inpatient eating disorder facility at discharge. 7. Seizure disorder: Reviewed her PTA home medication list in detail with pharmacist and resume as many as meds via tube feed or one or 2 oral as tolerated. 8. Essential hypertension: Toprol held due to soft blood pressures in the context of dehydration. Stable. 9. Congenital hypothyroidism: Followed by endocrinology at [facility]. Continue Synthroid. 10. Congenital deafness/cochlear implants 11. Hyperlipidemia: Resume statins when able to take. 12. Anemia: Likely  hemoconcentration on admission. Follow CBCs. 13. Overactive bladder: 14. Borderline DM: Not on  medications at home. Most recent A1c: 5.5.   DVT prophylaxis: DC Lovenox due to reported blood in stools on admission. SCDs. Code Status: Full Family Communication: None at bedside Disposition: DC when medically stable   Consultants:  None   Procedures:  Cor track placed 6/11  Antimicrobials:  Flagyl    Subjective: Seen this morning prior to cor track placement. Dry heaves without much emesis. Diffuse abdominal cramping/pain. Had small soft BM-witnessed small soft and green stools without blood in bedside commode and clear straw-colored urine. Reported anxiousness about getting the feeding tube.   ROS: No suicidal or homicidal ideations or auditory or visual hallucinations reported.  Objective:  Vitals:   09/29/16 1521 09/29/16 2147 09/30/16 0530 09/30/16 1036  BP: 107/81 111/74 117/77 115/79  Pulse: 99 96 93 94  Resp: 18 18 18    Temp: 98.4 F (36.9 C) 98.5 F (36.9 C) 98.1 F (36.7 C) 98.4 F (36.9 C)  TempSrc: Oral Oral  Oral  SpO2: 100% 96% 95% 98%  Weight: 70.4 kg (155 lb 4.8 oz)     Height: 5\' 8"  (1.727 m)       Examination:  General exam: Pleasant young female, lying comfortably propped up in bed. Patient was interviewed and examined with her RN in room. Respiratory system: Clear to auscultation. Respiratory effort normal. Cardiovascular system: S1 & S2 heard, RRR. No JVD, murmurs, rubs, gallops or clicks. No pedal edema. Telemetry: Sinus rhythm. Occasional mild sinus tachycardia. Gastrointestinal system: Abdomen is nondistended, soft. Diffuse mild tenderness/inconsistent but no rigidity, guarding or rebound. No organomegaly or masses felt. Normal bowel sounds heard. Central nervous system: Alert and oriented 2. No focal neurological deficits. Extremities: Symmetric 5 x 5 power. Skin: No rashes, lesions or ulcers Psychiatry: Judgement and insight appear impaired.  Mood & affect appears anxious slightly     Data Reviewed: I have personally reviewed following labs and imaging studies  CBC:  Recent Labs Lab 09/29/16 1142  WBC 11.7*  NEUTROABS 7.5  HGB 15.6*  HCT 45.4  MCV 90.4  PLT 053*   Basic Metabolic Panel:  Recent Labs Lab 09/29/16 1142 09/30/16 0752  NA 137 140  K 4.2 3.9  CL 104 114*  CO2 18* 16*  GLUCOSE 141* 107*  BUN 29* 22*  CREATININE 2.20* 1.84*  CALCIUM 11.1* 8.8*   CBG:  Recent Labs Lab 09/23/16 2136  GLUCAP 108*    Recent Results (from the past 240 hour(s))  MRSA PCR Screening     Status: Abnormal   Collection Time: 09/22/16  7:47 AM  Result Value Ref Range Status   MRSA by PCR POSITIVE (A) NEGATIVE Final    Comment:        The GeneXpert MRSA Assay (FDA approved for NASAL specimens only), is one component of a comprehensive MRSA colonization surveillance program. It is not intended to diagnose MRSA infection nor to guide or monitor treatment for MRSA infections. RESULT CALLED TO, READ BACK BY AND VERIFIED WITHCandie Chroman, RN AT [phone] 09/22/16 BY L BENFIELD   C difficile quick scan w PCR reflex     Status: None   Collection Time: 09/29/16  2:30 PM  Result Value Ref Range Status   C Diff antigen NEGATIVE NEGATIVE Final   C Diff toxin NEGATIVE NEGATIVE Final   C Diff interpretation No C. difficile detected.  Final         Radiology Studies: Dg Abd 2 Views  Result Date: 09/29/2016 CLINICAL DATA:  Nausea and vomiting for 3  days. EXAM: ABDOMEN - 2 VIEW COMPARISON:  CT 06/28/2014 FINDINGS: Supine and right-sided decubitus views. No gaseous distention of bowel loops on supine imaging. Distal gas identified. No abnormal abdominal calcifications. No appendicolith. Right-sided decubitus view demonstrates no free intraperitoneal air or significant air-fluid levels. Right hemidiaphragm elevation. IMPRESSION: No acute findings. Electronically Signed   By: Abigail Miyamoto M.D.   On: 09/29/2016 18:00         Scheduled Meds: . enoxaparin (LOVENOX) injection  40 mg Subcutaneous Q24H  . folic acid  1 mg Intravenous Daily  . levothyroxine  75 mcg Intravenous Daily  . OLANZapine zydis  5 mg Oral Once  . pantoprazole (PROTONIX) IV  40 mg Intravenous Q12H  . thiamine  100 mg Intravenous Daily   Continuous Infusions: . sodium chloride 125 mL/hr at 09/30/16 0818  . dextrose 5 % and 0.9% NaCl 125 mL/hr at 09/29/16 1549  . metronidazole Stopped (09/30/16 1429)     LOS: 1 day     HONGALGI,ANAND, MD, FACP, FHM. [facility] Pager [phone]  If 7PM-7AM, please contact night-coverage [email or web address] Password [facility] 09/30/2016, 3:23 PM

## 2016-09-30 NOTE — Progress Notes (Signed)
Cortrak Tube Team Note:  Consult received to place a Cortrak feeding tube.   A 10 F Cortrak tube was placed in the L nare and secured with a nasal bridle at 91 cm. Per the Cortrak monitor reading the tube tip is post-pyloric.    X-ray is required, abdominal x-ray has been ordered by the Cortrak team. Please confirm tube placement before using the Cortrak tube.   If the tube becomes dislodged please keep the tube and contact the Cortrak team at [email or web address] (password [email or web address]) for replacement.  If after hours and replacement cannot be delayed, place a NG tube and confirm placement with an abdominal x-ray.     Jarome Matin, MS, RD, LDN, [facility] Inpatient Clinical Dietitian Pager # [phone] After hours/weekend pager # [phone]

## 2016-09-30 NOTE — Progress Notes (Signed)
Coding clarification: Patient has eating disorder, exact type clinically undetermined. Seen by psychiatrist. F/u with psych outpatient.

## 2016-09-30 NOTE — Progress Notes (Signed)
Spoke to [place] in IT consultant. She stated we need to call Cortrak and  Have them attempt to put tube in. Paged Cortrak. Spoke to patient. She stated "the doctor told me I would be sedated. The last time I had it in I went [facility]" Patient gets upset when it is even mentioned, crying, thrashing. Will monitor.

## 2016-10-01 DIAGNOSIS — F329 Major depressive disorder, single episode, unspecified: Secondary | ICD-10-CM

## 2016-10-01 DIAGNOSIS — R319 Hematuria, unspecified: Secondary | ICD-10-CM

## 2016-10-01 DIAGNOSIS — D649 Anemia, unspecified: Secondary | ICD-10-CM

## 2016-10-01 DIAGNOSIS — N3281 Overactive bladder: Secondary | ICD-10-CM

## 2016-10-01 DIAGNOSIS — E781 Pure hyperglyceridemia: Secondary | ICD-10-CM

## 2016-10-01 DIAGNOSIS — E43 Unspecified severe protein-calorie malnutrition: Secondary | ICD-10-CM

## 2016-10-01 DIAGNOSIS — F429 Obsessive-compulsive disorder, unspecified: Secondary | ICD-10-CM

## 2016-10-01 DIAGNOSIS — N390 Urinary tract infection, site not specified: Secondary | ICD-10-CM

## 2016-10-01 DIAGNOSIS — N809 Endometriosis, unspecified: Secondary | ICD-10-CM

## 2016-10-01 LAB — BASIC METABOLIC PANEL
ANION GAP: 8 (ref 5–15)
BUN: 14 mg/dL (ref 6–20)
CHLORIDE: 113 mmol/L — AB (ref 101–111)
CO2: 19 mmol/L — AB (ref 22–32)
Calcium: 9.1 mg/dL (ref 8.9–10.3)
Creatinine, Ser: 1.61 mg/dL — ABNORMAL HIGH (ref 0.44–1.00)
GFR calc non Af Amer: 40 mL/min — ABNORMAL LOW (ref >60)
GFR, EST AFRICAN AMERICAN: 46 mL/min — AB (ref >60)
Glucose, Bld: 101 mg/dL — ABNORMAL HIGH (ref 65–99)
Potassium: 3.7 mmol/L (ref 3.5–5.1)
SODIUM: 140 mmol/L (ref 135–145)

## 2016-10-01 LAB — CBC
HCT: 35.7 % — ABNORMAL LOW (ref 36.0–46.0)
HEMOGLOBIN: 11.8 g/dL — AB (ref 12.0–15.0)
MCH: 30.3 pg (ref 26.0–34.0)
MCHC: 33.1 g/dL (ref 30.0–36.0)
MCV: 91.8 fL (ref 78.0–100.0)
Platelets: 272 10*3/uL (ref 150–400)
RBC: 3.89 MIL/uL (ref 3.87–5.11)
RDW: 13.7 % (ref 11.5–15.5)
WBC: 7.5 10*3/uL (ref 4.0–10.5)

## 2016-10-01 LAB — GLUCOSE, CAPILLARY
Glucose-Capillary: 89 mg/dL (ref 65–99)
Glucose-Capillary: 97 mg/dL (ref 65–99)
Glucose-Capillary: 100 mg/dL — ABNORMAL HIGH (ref 65–99)
Glucose-Capillary: 118 mg/dL — ABNORMAL HIGH (ref 65–99)
Glucose-Capillary: 97 mg/dL (ref 65–99)

## 2016-10-01 NOTE — Progress Notes (Signed)
Nutrition Follow-up  DOCUMENTATION CODES:   Not applicable  INTERVENTION:   -Continue Osmolite 1.2 @ 30 ml/hr via cortrak tube and increase by 10 ml every 12 hours to goal rate of 60 ml/hr.   Tube feeding regimen provides 1728 kcal (100% of needs), 80 grams of protein, and 1180 ml of H2O.   -Recommend monitor K, Mg, and Phos daily x 3 days, due to high refeeding risk  NUTRITION DIAGNOSIS:   Inadequate oral intake related to nausea, vomiting, chronic illness (eating disorder) as evidenced by meal completion < 25%, per patient/family report.  Ongoing  GOAL:   Patient will meet greater than or equal to 90% of their needs  Progressing  MONITOR:   PO intake, Diet advancement, Labs, Weight trends, TF tolerance, Skin, I & O's  REASON FOR ASSESSMENT:   Consult, Malnutrition Screening Tool Enteral/tube feeding initiation and management  ASSESSMENT:   Brandi Love is a 41 y.o. female with medical history significant for congenital hypothyroidism, hypertriglyceridemia with dyslipidemia, major depressive disorder with OCD and severe eating disorder manifested by bulimia and history of laxative abuse. She also has a history of endometriosis status post partial hysterectomy, reflux, hypertension, remote PE, and genital deafness, stage III chronic kidney disease and overactive bladder as well as anemia of chronic disease. She was recently discharged on 6/8 And admission for sepsis related to pyelonephritis.  Pt very cheerful at time of visit. She immediately recognized this RD and remembered name from previous hospitalization.   Pt reports extremely poor oral intake related to nausea and vomiting with any type of food or liquids, which started the evening she was previously discharged from the hospital. She reports she tried to eat foods such as gatorade, water, Ensure, and praline cookies, all of which automatically came back up. Spoke with cortrak tube placement RD, who also shared  that pt vomited water during tube placement yesterday.   Pt consumed a mango ice pop prior to RD visit, to which she complained of abdominal pain. Pt reports very mild pain from tube feeding, however denies any nausea or vomiting. Briefly discussed with pt rationale for TF. Osmolite 1.2 is currently infusing at 30 ml/hr via cortrak tube, currently providing 864 kcals, 40 grams protein, and 590 ml free water daily (52% of estimated kcal needs and 50% of estimated protein needs).   Limited Nutrition-Focused physical exam completed. Findings are no fat depletion, no muscle depletion, and no edema. No micronutrient deficiencies found on hair, skin, or nails.  Exam was ended prematurely, due to psychiatry evaluation. Discussed case with psychiatrist prior to his visit.   Labs reviewed: CBGS: 85-118.   Diet Order:  Diet clear liquid Room service appropriate? Yes; Fluid consistency: Thin  Skin:  Reviewed, no issues  Last BM:  10/01/16  Height:   Ht Readings from Last 1 Encounters:  09/29/16 5\' 8"  (1.727 m)    Weight:   Wt Readings from Last 1 Encounters:  09/30/16 155 lb 9.6 oz (70.6 kg)    Ideal Body Weight:  63.6 kg  BMI:  Body mass index is 23.66 kg/m.  Estimated Nutritional Needs:   Kcal:  1650-1850  Protein:  80-95 grams  Fluid:  >1.6 L  EDUCATION NEEDS:   Education needs no appropriate at this time  Zacary Bauer A. Jimmye Norman, RD, LDN, CDE Pager: [phone] After hours Pager: [phone]

## 2016-10-01 NOTE — Progress Notes (Signed)
PROGRESS NOTE   Brandi Love  IPJ:[id number]    DOB: 06-01-1975    DOA: 09/29/2016  PCP: Brandi Penna, MD   I have briefly reviewed patients previous medical records in [facility].  Brief Narrative:  41 year old female with PMH of anemia, anxiety and depression, chronic kidney disease, borderline DM, severe eating disorder/bulimia/laxative abuse history, OCD, GERD, HTN, HLD, congenital hypothyroid, hearing loss/cochlear implant, remote PE, seizure, recent hospitalization 09/18/16-09/27/16 for sepsis due to pyelonephritis, hypotension, acute psychosis and evaluated by psychiatry, needed short-term IVC, stabilized and discharged home, return to ED on 09/29/16 reporting that she had not eaten anything since that discharge (stated that she had not eaten for 27 days on admission), intractable nausea and vomiting, inability to keep anything by mouth, diffuse abdominal cramping and blood or mucus in stools. She had similar episode in 2017 when she underwent colonoscopy that revealed small internal hemorrhoids and a small anal fissure but no significant abnormalities. Admitted for intractable nausea, vomiting, associated dehydration, acute kidney injury and metabolic acidosis. Case discussed with her outpatient eating disorder dietitian and recommended tube feedings. Cor track placed and tube feeding initiated 6/11.Patient has been refusing her psychiatric medications. Psychiatry was consulted on 6/11.   Assessment & Plan:   Principal Problem:   Acute kidney injury ([name]) Active Problems:   OCD (obsessive compulsive disorder)   Eating disorder   Deafness congenital   Intractable nausea and vomiting   Bloody diarrhea   AKI (acute kidney injury) ([name])   Severe dehydration   Severe protein-calorie malnutrition ([facility])   Congenital hypothyroidism   Hypertriglyceridemia   Anemia   Endometriosis   MDD (major depressive disorder)   Hyperlipidemia   CKD (chronic kidney disease), stage III   OAB  (overactive bladder)   1. Intractable nausea and vomiting: In the setting of severe eating disorder, laxative abuse and apparent bulimia. As per patient's report, has been unable to tolerate any oral intake for 27 days on admission. Admitting team discussed with her outpatient eating disorder nutritionist who recommended tube feedings. Cor track was placed on 6/11 and continue postpyloric tube feeding, tolerating same. Continue IV PPI and when necessary IV antiemetics. No reports of nausea or vomiting since last night. Encouraged trial of clear liquids but patient refusing and states that she wants to continue tube feeding. Advised her that tube feeding is not a long-term option. 2. Dehydration: Secondary to GI losses and poor oral intake. IV fluids and tube feeding. 3. Acute kidney injury complicating stage III chronic kidney disease: Baseline creatinine not known but may be in the 1.5-1.6. Presented with creatinine of 2.2. Secondary to dehydration. Continue IV fluids. Improved to 1.8. No BMP seen for today, we'll order stat. 4. Non-anion gap metabolic acidosis: Secondary to GI losses. IV fluids as above and follow BMP closely. 5. Blood and mucus in stools: Reviewed stool in bedside commode on 6/11 and no further blood noted. Continue to monitor. C. difficile testing negative. Abdominal x-ray without acute findings. Started empirically on Flagyl on admission,? DC. No further blood in stools. Diarrhea seems to be improving. C. difficile testing and GI pathogen panel PCR negative-discontinued Flagyl. 6. Psychiatry (severe eating disorder/anxiety/depression/OCD/borderline personality disorder): Evaluated by psychiatry during previous admission. Documented reluctance in the past to take psychotropic medications despite professed desire to improve. No acute psychiatric issues at this time. Social worker consulted for placement to inpatient eating disorder facility at discharge. Resumed psychiatric medications on  6/11 but patient refusing to take. States that they  make her sleepy/"foggy headed", has not taken for a few days and mind fields clear. Intermittently agitated and screaming. Psychiatry consulted. 7. Seizure disorder: Reviewed her PTA home medication list in detail with pharmacist and resume as many as meds via tube feed or one or 2 oral as tolerated. Patient refuses her seizure medications and states that she had a remote seizure 1 due to "overdose of pain medications" that were given to her. 8. Essential hypertension: Toprol held due to soft blood pressures in the context of dehydration. Stable. 9. Congenital hypothyroidism: Followed by endocrinology at [facility]. Continue Synthroid. 10. Congenital deafness/cochlear implants 11. Hyperlipidemia: Resume statins when able to take. 12. Anemia: Likely hemoconcentration on admission. Follow CBCs. 13. Overactive bladder: 14. Borderline DM: Not on medications at home. Most recent A1c: 5.5.   DVT prophylaxis: DC Lovenox due to reported blood in stools on admission. SCDs. Code Status: Full Family Communication: None at bedside Disposition: DC when medically stable   Consultants:  Psychiatry  Procedures:  Cor track placed 6/11  Antimicrobials:  Flagyl    Subjective: No nausea or vomiting since last night. Tolerating tube feeds. No blood or mucus in stools as per RN witness and report. Denied abdominal pain except when examined. Refusing psychiatric and seizure medications.  ROS: No suicidal or homicidal ideations or auditory or visual hallucinations reported.  Objective:  Vitals:   09/30/16 1700 09/30/16 2020 10/01/16 0824 10/01/16 1443  BP:  114/86 109/73 123/87  Pulse:  (!) 102 89 87  Resp:  '18 18 16  ' Temp:  98.1 F (36.7 C) 98.2 F (36.8 C) 97.4 F (36.3 C)  TempSrc:  Oral  Oral  SpO2:  100% 98% 100%  Weight: 70.6 kg (155 lb 9.6 oz)     Height:        Examination:  General exam: Pleasant young female, lying comfortably  propped up in bed. Patient was interviewed and examined with her RN in room. Respiratory system: Clear to auscultation. Respiratory effort normal. Cardiovascular system: S1 & S2 heard, RRR. No JVD, murmurs, rubs, gallops or clicks. No pedal edema. Telemetry: Sinus rhythm. Occasional mild sinus tachycardia. Gastrointestinal system: Abdomen is nondistended, soft. ??diffuse mild tenderness/inconsistent(many times starts to complain of pain even before abdomen is touched) but no obvious rigidity, guarding or rebound. No organomegaly or masses felt. Normal bowel sounds heard. Central nervous system: Alert and oriented 3. No focal neurological deficits. Extremities: Symmetric 5 x 5 power. Skin: No rashes, lesions or ulcers Psychiatry: Judgement and insight appear impaired. Mood & affect appears anxious slightly     Data Reviewed: I have personally reviewed following labs and imaging studies  CBC:  Recent Labs Lab 09/29/16 1142  WBC 11.7*  NEUTROABS 7.5  HGB 15.6*  HCT 45.4  MCV 90.4  PLT 856*   Basic Metabolic Panel:  Recent Labs Lab 09/29/16 1142 09/30/16 0752  NA 137 140  K 4.2 3.9  CL 104 114*  CO2 18* 16*  GLUCOSE 141* 107*  BUN 29* 22*  CREATININE 2.20* 1.84*  CALCIUM 11.1* 8.8*   CBG:  Recent Labs Lab 09/30/16 2018 10/01/16 0141 10/01/16 0819 10/01/16 1225 10/01/16 1628  GLUCAP 85 89 97 100* 118*    Recent Results (from the past 240 hour(s))  MRSA PCR Screening     Status: Abnormal   Collection Time: 09/22/16  7:47 AM  Result Value Ref Range Status   MRSA by PCR POSITIVE (A) NEGATIVE Final    Comment:  The GeneXpert MRSA Assay (FDA approved for NASAL specimens only), is one component of a comprehensive MRSA colonization surveillance program. It is not intended to diagnose MRSA infection nor to guide or monitor treatment for MRSA infections. RESULT CALLED TO, READ BACK BY AND VERIFIED WITHCandie Chroman, RN AT [phone] 09/22/16 BY L BENFIELD     Gastrointestinal Panel by PCR , Stool     Status: None   Collection Time: 09/29/16  2:30 PM  Result Value Ref Range Status   Campylobacter species NOT DETECTED NOT DETECTED Final   Plesimonas shigelloides NOT DETECTED NOT DETECTED Final   Salmonella species NOT DETECTED NOT DETECTED Final   Yersinia enterocolitica NOT DETECTED NOT DETECTED Final   Vibrio species NOT DETECTED NOT DETECTED Final   Vibrio cholerae NOT DETECTED NOT DETECTED Final   Enteroaggregative E coli (EAEC) NOT DETECTED NOT DETECTED Final   Enteropathogenic E coli (EPEC) NOT DETECTED NOT DETECTED Final   Enterotoxigenic E coli (ETEC) NOT DETECTED NOT DETECTED Final   Shiga like toxin producing E coli (STEC) NOT DETECTED NOT DETECTED Final   Shigella/Enteroinvasive E coli (EIEC) NOT DETECTED NOT DETECTED Final   Cryptosporidium NOT DETECTED NOT DETECTED Final   Cyclospora cayetanensis NOT DETECTED NOT DETECTED Final   Entamoeba histolytica NOT DETECTED NOT DETECTED Final   Giardia lamblia NOT DETECTED NOT DETECTED Final   Adenovirus F40/41 NOT DETECTED NOT DETECTED Final   Astrovirus NOT DETECTED NOT DETECTED Final   Norovirus GI/GII NOT DETECTED NOT DETECTED Final   Rotavirus A NOT DETECTED NOT DETECTED Final   Sapovirus (I, II, IV, and V) NOT DETECTED NOT DETECTED Final  C difficile quick scan w PCR reflex     Status: None   Collection Time: 09/29/16  2:30 PM  Result Value Ref Range Status   C Diff antigen NEGATIVE NEGATIVE Final   C Diff toxin NEGATIVE NEGATIVE Final   C Diff interpretation No C. difficile detected.  Final         Radiology Studies: Dg Abd 2 Views  Result Date: 09/29/2016 CLINICAL DATA:  Nausea and vomiting for 3 days. EXAM: ABDOMEN - 2 VIEW COMPARISON:  CT 06/28/2014 FINDINGS: Supine and right-sided decubitus views. No gaseous distention of bowel loops on supine imaging. Distal gas identified. No abnormal abdominal calcifications. No appendicolith. Right-sided decubitus view demonstrates  no free intraperitoneal air or significant air-fluid levels. Right hemidiaphragm elevation. IMPRESSION: No acute findings. Electronically Signed   By: Abigail Miyamoto M.D.   On: 09/29/2016 18:00   Dg Abd Portable 1v  Result Date: 09/30/2016 CLINICAL DATA:  Feeding tube placement. EXAM: PORTABLE ABDOMEN - 1 VIEW COMPARISON:  09/29/2017 FINDINGS: Feeding tube enters the stomach, passes into the duodenum with its tip at the level of ligament of Treitz. IMPRESSION: Feeding tube tip at the ligament of Treitz. Electronically Signed   By: Nelson Chimes M.D.   On: 09/30/2016 16:30        Scheduled Meds: . atorvastatin  20 mg Per Tube q1800  . folic acid  1 mg Intravenous Daily  . haloperidol  5 mg Per Tube QHS  . lamoTRIgine  200 mg Per Tube Daily  . levothyroxine  150 mcg Per Tube QAC breakfast  . loratadine  10 mg Per Tube Daily  . lurasidone  20 mg Per Tube Q breakfast  . Melatonin  9 mg Tube Q2000  . multivitamin  15 mL Per Tube Daily  . pantoprazole (PROTONIX) IV  40 mg Intravenous Q12H  . QUEtiapine  150 mg Oral QHS  . thiamine  100 mg Intravenous Daily  . topiramate  50 mg Per Tube BID  . venlafaxine XR  150 mg Oral Q breakfast   Continuous Infusions: . feeding supplement (OSMOLITE 1.2 CAL) 1,000 mL (10/01/16 8721)  . metronidazole Stopped (10/01/16 1530)     LOS: 2 days   As per nursing update, patient's registered dietitian came by and met with patient following which patient has been compliant with her medications.  Vernell Leep, MD, FACP, FHM. [facility] Pager [phone]  If 7PM-7AM, please contact night-coverage [email or web address] Password [facility] 10/01/2016, 4:53 PM

## 2016-10-01 NOTE — Consult Note (Addendum)
[facility] Consult   Reason for Consult:  Refusing psych medication and screaming or yelling Referring Physician:  Dr.Hongalgi Patient Identification: Brandi Love MRN:  [id number] Principal Diagnosis: Acute kidney injury [facility]) Diagnosis:   Patient Active Problem List   Diagnosis Date Noted  . OCD (obsessive compulsive disorder) [F42.9] 09/29/2016  . Eating disorder [F50.9] 09/29/2016  . Deafness congenital [H90.5] 09/29/2016  . Intractable nausea and vomiting [R11.2] 09/29/2016  . Bloody diarrhea [R19.7] 09/29/2016  . AKI (acute kidney injury) ([place]) [N17.9] 09/29/2016  . Severe dehydration [E86.0] 09/29/2016  . Severe protein-calorie malnutrition ([place]) [E43] 09/29/2016  . Congenital hypothyroidism [E03.1] 09/29/2016  . Hypertriglyceridemia [E78.1] 09/29/2016  . Anemia [D64.9] 09/29/2016  . Endometriosis [N80.9] 09/29/2016  . MDD (major depressive disorder) [F32.9] 09/29/2016  . Hyperlipidemia [E78.5] 09/29/2016  . CKD (chronic kidney disease), stage III [N18.3] 09/29/2016  . Acute kidney injury ([place]) [N17.9] 09/29/2016  . OAB (overactive bladder) [N32.81] 09/29/2016  . Sepsis ([place]) [A41.9] 09/18/2016  . Acute pyelonephritis [N10] 09/18/2016  . MDD (major depressive disorder), recurrent, severe, with psychosis ([place]) [F33.3] 12/11/2014  . Eating disorder, unspecified [F50.9] 11/23/2012  . Hypothyroidism [E03.9] 11/23/2012  . Dyslipidemia [E78.5] 11/23/2012  . Sinus tachycardia [R00.0] 11/23/2012  . CKD (chronic kidney disease) stage 3, GFR 30-59 ml/min [N18.3] 11/18/2012  . Protein-calorie malnutrition, severe ([place]) [E43] 11/13/2012  . Abdominal pain, epigastric [R10.13] 11/12/2012  . Dehydration [E86.0] 11/12/2012  . Acute on chronic renal failure ([place]) [N17.9, N18.9] 11/12/2012  . Leukocytosis, unspecified [D72.829] 11/12/2012  . Hyponatremia [E87.1] 11/12/2012  . Borderline personality disorder [F60.3] 11/12/2012    Total Time spent with patient: 30  minutes  Subjective:   Brandi Love is a 41 y.o. female patient admitted with abdominal pain and decreased oral intake.  HPI:  Brandi Love is a 41 years old female with a history of depression, anxiety, eating disorder, endometriosis GERD, hyperlipidemia, bilateral hearing impairment, hypothyroidism migraine and pulmonary embolism admitted to hospital within 48 hours after discharged to home with the increased stomach upset, nausea, vomiting, dehydration and reportedly her outpatient team members including nutritionist recommended rehospitalization. Patient is known to this provider from her recent hospitalization at [facility] and provided psychiatric consultation. Patient was able to recognize this provider with the name and has no difficulty to cooperate with this evaluation.  Patient has been receiving nasogastric tube and receiving her current feedings and few medications without difficulties. Patient reported she has been compliant with her psychiatric medication as long as they provided through feeding tube and also reported she was able to swallow Effexor XR this morning. Patient stated she has intended to continue taking her medication as long as she does not have to swallow because of pain associated with swallowing.   Patient also reported her current treatment team is looking into her insurance information and possible placement at [place] for eating disorder treatment.   Patient diagnosed with borderline personality disorder and getting treatment including past DBT.  Patient has at [facility] team. Patient uses cochlear implant. Her last hospitalization was at [facility].  She denies any suicidal thoughts but feels very depressed and anxious and denied the auditory hallucinations.  She has no plan or intent to kill herself but endorsed. She is prescribed Latuda, Lamictal, Seroquel, Effexor. Marland Kitchen  Past Psychiatric History: Patient has multiple hospitalization in the past.  She  has history of suicidal attempt by strangulating herself while she was in the hospital.  She stated 3 years in  [facility].  She was diagnosed with borderline personality disorder, major depression, bipolar disorder and eating disorder.    Risk to Self: Is patient at risk for suicide?: No Risk to Others:   Prior Inpatient Therapy:   Prior Outpatient Therapy:    Past Medical History:  Past Medical History:  Diagnosis Date  . Anemia   . Anxiety   . Arthritis    "hands" (09/18/2016)  . Chronic lower back pain   . Chronic renal insufficiency   . Deafness   . Depression   . Diabetes ([place])    "borderline"  . Eating disorder   . Endometriosis   . GERD (gastroesophageal reflux disease)   . History of kidney stones   . Hyperlipidemia   . Hypertension   . 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 Migraine    "frequency varies" (09/18/2016)  . OCD (obsessive compulsive disorder)   . Pneumonia 2011  . PONV (postoperative nausea and vomiting)   . Pulmonary embolism ([place]) 10/2001  . Seizures ([place]) 09/2001 X 1   "day after my hysterectomy"  . Tachycardia     Past Surgical History:  Procedure Laterality Date  . ABDOMINAL HYSTERECTOMY  09/2001   'except for right ovary'  . BREAST LUMPECTOMY Left 2000   benign  . BREAST LUMPECTOMY Right 2002   benign  . COCHLEAR IMPLANT Bilateral 2004-2010   "right-left"  . EYE MUSCLE SURGERY Left 1980   "lazy eye"  . EYE MUSCLE SURGERY Right 1990   "lazy eye"  . LAPAROSCOPIC ABDOMINAL EXPLORATION  2001 and 2002   for endometriosis   Family History:  Family History  Problem Relation Age of Onset  . Cancer Father   . Hyperlipidemia Father   . Osteoarthritis Mother   . Hyperlipidemia Mother    Family Psychiatric  History: Patient endorsed multiple family member has mental illness. Social History:  History  Alcohol Use No     History  Drug Use No    Social History   Social History  . Marital status: Single     Spouse name: N/A  . Number of children: 0  . Years of education: 14   Occupational History  . Disabled.    Social History Main Topics  . Smoking status: Never Smoker  . Smokeless tobacco: Never Used  . Alcohol use No  . Drug use: No  . Sexual activity: No   Other Topics Concern  . None   Social History Narrative   Lives alone.  Single.  No family in town.     Additional Social History:    Allergies:   Allergies  Allergen Reactions  . Dhea [Nutritional Supplements] Nausea And Vomiting  . Entex Lq [Phenylephrine-Guaifenesin] Shortness Of Breath and Swelling    Throat swelling  . Indomethacin Nausea And Vomiting  . Iodinated Diagnostic Agents Anaphylaxis    'cant breathe'  . Melatonin Itching  . Propoxyphene Anaphylaxis  . Risperidone And Related Other (See Comments)    Out of body experience  . Shellfish Allergy Other (See Comments)    unspecified  . Depakote [Divalproex Sodium] Nausea And Vomiting and Other (See Comments)    Weight gain  . Abilify [Aripiprazole] Nausea And Vomiting  . Dihydroergotamine Nausea And Vomiting  . Doxycycline Nausea And Vomiting  . Erythromycin Nausea And Vomiting  . Lactose Intolerance (Gi) Nausea And Vomiting  . Nsaids Other (See Comments)    Unable to ttake due to renal insufficency  . Tape  Other (See Comments)    Redness, can use paper tape    Labs:  Results for orders placed or performed during the hospital encounter of 09/29/16 (from the past 48 hour(s))  CBC with Differential/Platelet     Status: Abnormal   Collection Time: 09/29/16 11:42 AM  Result Value Ref Range   WBC 11.7 (H) 4.0 - 10.5 K/uL   RBC 5.02 3.87 - 5.11 MIL/uL   Hemoglobin 15.6 (H) 12.0 - 15.0 g/dL   HCT 45.4 36.0 - 46.0 %   MCV 90.4 78.0 - 100.0 fL   MCH 31.1 26.0 - 34.0 pg   MCHC 34.4 30.0 - 36.0 g/dL   RDW 13.6 11.5 - 15.5 %   Platelets 402 (H) 150 - 400 K/uL   Neutrophils Relative % 65 %   Neutro Abs 7.5 1.7 - 7.7 K/uL   Lymphocytes Relative 25 %    Lymphs Abs 3.0 0.7 - 4.0 K/uL   Monocytes Relative 9 %   Monocytes Absolute 1.1 (H) 0.1 - 1.0 K/uL   Eosinophils Relative 1 %   Eosinophils Absolute 0.1 0.0 - 0.7 K/uL   Basophils Relative 0 %   Basophils Absolute 0.1 0.0 - 0.1 K/uL  Basic metabolic panel     Status: Abnormal   Collection Time: 09/29/16 11:42 AM  Result Value Ref Range   Sodium 137 135 - 145 mmol/L   Potassium 4.2 3.5 - 5.1 mmol/L   Chloride 104 101 - 111 mmol/L   CO2 18 (L) 22 - 32 mmol/L   Glucose, Bld 141 (H) 65 - 99 mg/dL   BUN 29 (H) 6 - 20 mg/dL   Creatinine, Ser 2.20 (H) 0.44 - 1.00 mg/dL   Calcium 11.1 (H) 8.9 - 10.3 mg/dL   GFR calc non Af Amer 27 (L) >60 mL/min   GFR calc Af Amer 32 (L) >60 mL/min    Comment: (NOTE) The eGFR has been calculated using the CKD EPI equation. This calculation has not been validated in all clinical situations. eGFR's persistently <60 mL/min signify possible Chronic Kidney Disease.    Anion gap 15 5 - 15  I-Stat Beta hCG blood, ED ([facility], [facility], [facility] only)     Status: None   Collection Time: 09/29/16  1:35 PM  Result Value Ref Range   I-stat hCG, quantitative <5.0 <5 mIU/mL   Comment 3            Comment:   GEST. AGE      CONC.  (mIU/mL)   <=1 WEEK        5 - 50     2 WEEKS       50 - 500     3 WEEKS       100 - 10,000     4 WEEKS     1,000 - 30,000        FEMALE AND NON-PREGNANT FEMALE:     LESS THAN 5 mIU/mL   Gastrointestinal Panel by PCR , Stool     Status: None   Collection Time: 09/29/16  2:30 PM  Result Value Ref Range   Campylobacter species NOT DETECTED NOT DETECTED   Plesimonas shigelloides NOT DETECTED NOT DETECTED   Salmonella species NOT DETECTED NOT DETECTED   Yersinia enterocolitica NOT DETECTED NOT DETECTED   Vibrio species NOT DETECTED NOT DETECTED   Vibrio cholerae NOT DETECTED NOT DETECTED   Enteroaggregative E coli (EAEC) NOT DETECTED NOT DETECTED   Enteropathogenic E coli (EPEC) NOT DETECTED NOT  DETECTED   Enterotoxigenic E coli (ETEC) NOT  DETECTED NOT DETECTED   Shiga like toxin producing E coli (STEC) NOT DETECTED NOT DETECTED   Shigella/Enteroinvasive E coli (EIEC) NOT DETECTED NOT DETECTED   Cryptosporidium NOT DETECTED NOT DETECTED   Cyclospora cayetanensis NOT DETECTED NOT DETECTED   Entamoeba histolytica NOT DETECTED NOT DETECTED   Giardia lamblia NOT DETECTED NOT DETECTED   Adenovirus F40/41 NOT DETECTED NOT DETECTED   Astrovirus NOT DETECTED NOT DETECTED   Norovirus GI/GII NOT DETECTED NOT DETECTED   Rotavirus A NOT DETECTED NOT DETECTED   Sapovirus (I, II, IV, and V) NOT DETECTED NOT DETECTED  C difficile quick scan w PCR reflex     Status: None   Collection Time: 09/29/16  2:30 PM  Result Value Ref Range   C Diff antigen NEGATIVE NEGATIVE   C Diff toxin NEGATIVE NEGATIVE   C Diff interpretation No C. difficile detected.   Urinalysis, Routine w reflex microscopic     Status: Abnormal   Collection Time: 09/29/16  2:31 PM  Result Value Ref Range   Color, Urine YELLOW YELLOW   APPearance CLEAR CLEAR   Specific Gravity, Urine 1.015 1.005 - 1.030   pH 7.0 5.0 - 8.0   Glucose, UA NEGATIVE NEGATIVE mg/dL   Hgb urine dipstick NEGATIVE NEGATIVE   Bilirubin Urine NEGATIVE NEGATIVE   Ketones, ur NEGATIVE NEGATIVE mg/dL   Protein, ur 30 (A) NEGATIVE mg/dL   Nitrite NEGATIVE NEGATIVE   Leukocytes, UA NEGATIVE NEGATIVE   RBC / HPF 0-5 0 - 5 RBC/hpf   WBC, UA 0-5 0 - 5 WBC/hpf   Bacteria, UA NONE SEEN NONE SEEN   Squamous Epithelial / LPF 0-5 (A) NONE SEEN   Mucous PRESENT   Basic metabolic panel     Status: Abnormal   Collection Time: 09/30/16  7:52 AM  Result Value Ref Range   Sodium 140 135 - 145 mmol/L   Potassium 3.9 3.5 - 5.1 mmol/L   Chloride 114 (H) 101 - 111 mmol/L   CO2 16 (L) 22 - 32 mmol/L   Glucose, Bld 107 (H) 65 - 99 mg/dL   BUN 22 (H) 6 - 20 mg/dL   Creatinine, Ser 1.84 (H) 0.44 - 1.00 mg/dL   Calcium 8.8 (L) 8.9 - 10.3 mg/dL    Comment: DELTA CHECK NOTED   GFR calc non Af Amer 34 (L) >60  mL/min   GFR calc Af Amer 39 (L) >60 mL/min    Comment: (NOTE) The eGFR has been calculated using the CKD EPI equation. This calculation has not been validated in all clinical situations. eGFR's persistently <60 mL/min signify possible Chronic Kidney Disease.    Anion gap 10 5 - 15  Glucose, capillary     Status: None   Collection Time: 09/30/16  6:27 PM  Result Value Ref Range   Glucose-Capillary 86 65 - 99 mg/dL  Glucose, capillary     Status: None   Collection Time: 09/30/16  8:18 PM  Result Value Ref Range   Glucose-Capillary 85 65 - 99 mg/dL  Glucose, capillary     Status: None   Collection Time: 10/01/16  1:41 AM  Result Value Ref Range   Glucose-Capillary 89 65 - 99 mg/dL  Glucose, capillary     Status: None   Collection Time: 10/01/16  8:19 AM  Result Value Ref Range   Glucose-Capillary 97 65 - 99 mg/dL    Current Facility-Administered Medications  Medication Dose Route Frequency Provider Last  Rate Last Dose  . acetaminophen (TYLENOL) tablet 650 mg  650 mg Oral Q6H PRN Samella Parr, NP       Or  . acetaminophen (TYLENOL) suppository 650 mg  650 mg Rectal Q6H PRN Samella Parr, NP      . atorvastatin (LIPITOR) tablet 20 mg  20 mg Per Tube q1800 Hongalgi, Anand D, MD      . feeding supplement (OSMOLITE 1.2 CAL) liquid 1,000 mL  1,000 mL Per Tube Continuous Modena Jansky, MD 30 mL/hr at 10/01/16 0613 1,000 mL at 10/01/16 0613  . folic acid injection 1 mg  1 mg Intravenous Daily Samella Parr, NP   1 mg at 09/30/16 0819  . haloperidol (HALDOL) tablet 5 mg  5 mg Per Tube QHS Hongalgi, Anand D, MD      . hydrOXYzine (ATARAX/VISTARIL) tablet 50 mg  50 mg Per Tube TID PRN Modena Jansky, MD   50 mg at 09/30/16 2144  . lamoTRIgine (LAMICTAL) tablet 200 mg  200 mg Per Tube Daily Hongalgi, Lenis Dickinson, MD      . levothyroxine (SYNTHROID, LEVOTHROID) tablet 150 mcg  150 mcg Per Tube QAC breakfast Hongalgi, Lenis Dickinson, MD      . loratadine (CLARITIN) tablet 10 mg  10 mg  Per Tube Daily Hongalgi, Anand D, MD      . lurasidone (LATUDA) tablet 20 mg  20 mg Per Tube Q breakfast Hongalgi, Lenis Dickinson, MD      . Melatonin TABS 9 mg  9 mg Tube Q2000 Hongalgi, Anand D, MD      . metroNIDAZOLE (FLAGYL) IVPB 500 mg  500 mg Intravenous Q8H Samella Parr, NP   Stopped at 10/01/16 [phone]  . morphine 4 MG/ML injection 1-4 mg  1-4 mg Intravenous Q2H PRN Samella Parr, NP   2 mg at 09/30/16 2005  . multivitamin liquid 15 mL  15 mL Per Tube Daily Hongalgi, Anand D, MD      . ondansetron [facility]) injection 4 mg  4 mg Intravenous Q6H PRN Samella Parr, NP   4 mg at 09/30/16 1921   Or  . promethazine (PHENERGAN) injection 25 mg  25 mg Intravenous Q6H PRN Samella Parr, NP      . pantoprazole (PROTONIX) injection 40 mg  40 mg Intravenous Q12H Samella Parr, NP   40 mg at 09/30/16 2116  . phenol (CHLORASEPTIC) mouth spray 1 spray  1 spray Mouth/Throat PRN Modena Jansky, MD   1 spray at 09/30/16 1329  . QUEtiapine (SEROQUEL XR) 24 hr tablet 150 mg  150 mg Oral QHS Hongalgi, Anand D, MD      . thiamine (B-1) injection 100 mg  100 mg Intravenous Daily Samella Parr, NP   100 mg at 09/30/16 0819  . topiramate (TOPAMAX) tablet 50 mg  50 mg Per Tube BID Hongalgi, Anand D, MD      . venlafaxine XR (EFFEXOR-XR) 24 hr capsule 150 mg  150 mg Oral Q breakfast Hongalgi, Lenis Dickinson, MD        Musculoskeletal: Strength & Muscle Tone: increased and decreased Gait & Station: Lying on her bed Patient leans: N/A  Psychiatric Specialty Exam: Physical Exam  Review of Systems  HENT: Negative.   Musculoskeletal: Negative.   Skin: Negative.   Neurological: Positive for weakness.  Psychiatric/Behavioral: Positive for hallucinations.    Blood pressure 109/73, pulse 89, temperature 98.2 F (36.8 C), resp. rate 18, height '5\' 8"'  (  1.727 m), weight 70.6 kg (155 lb 9.6 oz), SpO2 98 %.Body mass index is 23.66 kg/m.  General Appearance: Guarded  Eye Contact:  Fair  Speech:  Slow  Volume:   Decreased  Mood:  Anxious, Depressed, Hopeless and Irritable  Affect:  Constricted and Depressed  Thought Process:  Goal Directed  Orientation:  Full (Time, Place, and Person)  Thought Content:  Hallucinations: Auditory and Rumination  Suicidal Thoughts:  No  Homicidal Thoughts:  No  Memory:  Immediate;   Fair Recent;   Fair Remote;   Fair  Judgement:  Impaired  Insight:  Lacking  Psychomotor Activity:  Decreased  Concentration:  Concentration: Fair and Attention Span: Fair  Recall:  Good  Fund of Knowledge:  Good  Language:  Fair  Akathisia:  No  Handed:  Right  AIMS (if indicated):     Assets:  Communication Skills Desire for Improvement Social Support  ADL's:  Impaired  Cognition:  WNL  Sleep:        Treatment Plan Summary: Patient was readmitted to the hospital with the stomach upset, nausea, vomiting, dehydration within 48 AFTER being discharge from the hospital. Patient does stayed extensive length during her recent hospitalization, patient reportedly looking for eating disorder placement once medically stable. She was denied at [facility] hospital eating disorder program.  Patient has no suicidal/homicidal ideation or evidence of psychosis.  Recent does not meet criteria for acute psychiatric hospitalization.  Patient has been compliant with the Nasogastric tube placement and feedings and also feeding medications as possible and reportedly she swallowed Effexor XR today with some difficulty.  Patient is asking physician to continue her he NG tube placement until she feels much better and her individual private nutritionist recommended the same.   Patient will continue her home psychotropic medication without any changes during this visit.   Disposition: Patient may be discharged to the home and outpatient eating disorder treatment team including ACT team and eating disorder nutritionist. She has plans to  Apply to [place] - inpatient eating disorder program at  [facility].  Supportive therapy provided about ongoing stressors.  Ambrose Finland, MD 10/01/2016 10:31 AM

## 2016-10-02 LAB — BASIC METABOLIC PANEL
ANION GAP: 6 (ref 5–15)
BUN: 12 mg/dL (ref 6–20)
CO2: 21 mmol/L — ABNORMAL LOW (ref 22–32)
CREATININE: 1.62 mg/dL — AB (ref 0.44–1.00)
Calcium: 9 mg/dL (ref 8.9–10.3)
Chloride: 110 mmol/L (ref 101–111)
GFR, EST AFRICAN AMERICAN: 46 mL/min — AB (ref >60)
GFR, EST NON AFRICAN AMERICAN: 39 mL/min — AB (ref >60)
GLUCOSE: 105 mg/dL — AB (ref 65–99)
Potassium: 5.4 mmol/L — ABNORMAL HIGH (ref 3.5–5.1)
Sodium: 137 mmol/L (ref 135–145)

## 2016-10-02 LAB — GLUCOSE, CAPILLARY
Glucose-Capillary: 125 mg/dL — ABNORMAL HIGH (ref 65–99)
Glucose-Capillary: 113 mg/dL — ABNORMAL HIGH (ref 65–99)
Glucose-Capillary: 115 mg/dL — ABNORMAL HIGH (ref 65–99)

## 2016-10-02 NOTE — Consult Note (Signed)
Patient improving.  Is trying to eating/drink some clear liquids.  Is  Still complaining of N/V, but that is improving.  Overall feeling better, but not all better.   Taking medication as prescribed.  Not taking meds by mouth, but by tube.  I want her to tolerate meds by mouth before she is discharged.  Would also like her to be able to keep liquids down before discharge.  As of right now not able to consume much nutrition by mouth.  I would like to see her tolerate 90% nutrition goal by mouth before discharge.  Gut needs time to heal.  NG tube is providing necessary fuel for now.  Would like to see that in place until she can consume 75% calorie goal by mouth if possible   Outpatient RD available for consult as needed.  Cell is [phone]

## 2016-10-02 NOTE — Progress Notes (Signed)
Pt allowed this RN to administer all scheduled meds for today, 10/02/16. Pt did take Effexor PO but wanted to take all other meds through NG tube r/t feeling nauseous.

## 2016-10-02 NOTE — Progress Notes (Signed)
PROGRESS NOTE   Brandi Love  MGQ:[id number]    DOB: Mar 22, 1976    DOA: 09/29/2016  PCP: Vickii Penna, MD   I have briefly reviewed patients previous medical records in [facility].  Brief Narrative:  41 year old female with PMH of anemia, anxiety and depression, chronic kidney disease, borderline DM, severe eating disorder/bulimia/laxative abuse history, OCD, GERD, HTN, HLD, congenital hypothyroid, hearing loss/cochlear implant, remote PE, seizure, recent hospitalization 09/18/16-09/27/16 for sepsis due to pyelonephritis, hypotension, acute psychosis and evaluated by psychiatry, needed short-term IVC, stabilized and discharged home, return to ED on 09/29/16 reporting that she had not eaten anything since that discharge (stated that she had not eaten for 27 days on admission), intractable nausea and vomiting, inability to keep anything by mouth, diffuse abdominal cramping and blood or mucus in stools. She had similar episode in 2017 when she underwent colonoscopy that revealed small internal hemorrhoids and a small anal fissure but no significant abnormalities. Admitted for intractable nausea, vomiting, associated dehydration, acute kidney injury and metabolic acidosis. Case discussed with her outpatient eating disorder dietitian and recommended tube feedings. Cor track placed and tube feeding initiated 6/11.Patient has been refusing her psychiatric medications. Psychiatry was consulted on 6/11.   Assessment & Plan:   Principal Problem:   Acute kidney injury ([place]) Active Problems:   OCD (obsessive compulsive disorder)   Eating disorder   Deafness congenital   Intractable nausea and vomiting   Bloody diarrhea   AKI (acute kidney injury) ([place])   Severe dehydration   Severe protein-calorie malnutrition (HCC)   Congenital hypothyroidism   Hypertriglyceridemia   Anemia   Endometriosis   MDD (major depressive disorder)   Hyperlipidemia   CKD (chronic kidney disease), stage III   OAB  (overactive bladder)   1. Intractable nausea and vomiting: In the setting of severe eating disorder, laxative abuse and apparent bulimia. As per patient's report, has been unable to tolerate any oral intake for 27 days on admission. Admitting team discussed with her outpatient eating disorder nutritionist who recommended tube feedings. Cor track was placed on 6/11 and continue postpyloric tube feeding, tolerating same. Continue IV PPI and when necessary IV antiemetics. No vomiting in the last 24 hours. Occasional nausea. Reports abdominal pain. Discussed in detail with patient's outpatient RD Ms. Lynden Ang on 6/12 >she indicated that patient's recent eating disorder relapse has drastically affected her mental and physical health and strongly recommends ongoing inpatient care including tube feedings until patient is able to gradually tolerate adequate oral intake to sustain nutritional needs prior to considering discharge. Search ongoing for inpatient eating disorder facility placement. 2. Dehydration: Secondary to GI losses and poor oral intake. IV fluids and tube feeding. Dehydration resolved. 3. Acute kidney injury complicating stage III chronic kidney disease: Baseline creatinine not known but may be in the 1.5-1.6. Presented with creatinine of 2.2. Secondary to dehydration. Continue IV fluids. Creatinine has plateaued at 1.6 which may be her baseline. Mild hyperkalemia, unclear etiology. Follow BMP in a.m. 4. Non-anion gap metabolic acidosis: Secondary to GI losses. Improved. 5. Blood and mucus in stools: Reviewed stool in bedside commode on 6/11 and no further blood noted. Continue to monitor. C. difficile testing negative. Abdominal x-ray without acute findings. Started empirically on Flagyl on admission,? DC. No further blood in stools. Diarrhea seems to be improving. C. difficile testing and GI pathogen panel PCR negative-discontinued Flagyl. Seems to resolve. 6. Psychiatry (severe eating  disorder/anxiety/depression/OCD/borderline personality disorder): Evaluated by psychiatry during previous admission. Documented reluctance  in the past to take psychotropic medications despite professed desire to improve. No acute psychiatric issues at this time. Social worker consulted for placement to inpatient eating disorder facility at discharge. Psychiatry consultation appreciated, recommend continued current treatment. Patient now taking her medications. 7. Seizure disorder: Reviewed her PTA home medication list in detail with pharmacist and resume as many as meds via tube feed or one or 2 oral as tolerated. States that she had a remote seizure 1 due to "overdose of pain medications" that were given to her. Patient not taking her medications. 8. Essential hypertension: Toprol held due to soft blood pressures in the context of dehydration. Stable. 9. Congenital hypothyroidism: Followed by endocrinology at [facility]. Continue Synthroid. 10. Congenital deafness/cochlear implants 11. Hyperlipidemia: Resume statins when able to take. 12. Anemia: Likely hemoconcentration on admission. Stable. 13. Overactive bladder: 14. Borderline DM: Not on medications at home. Most recent A1c: 5.5.   DVT prophylaxis: DC Lovenox due to reported blood in stools on admission. SCDs. Code Status: Full Family Communication: None at bedside Disposition: DC when medically stable   Consultants:  Psychiatry  Procedures:  Cor track placed 6/11  Antimicrobials:  Flagyl    Subjective: No vomiting since yesterday morning. Mild intermittent nausea. Tolerating tube feeds. Still reports abdominal pain but may be slightly better. No blood in stools. Only taking Flexeril by mouth, rest by tube. Ate a Popsicle and some clear liquids by mouth yesterday.  ROS: No suicidal or homicidal ideations or auditory or visual hallucinations reported.  Objective:  Vitals:   10/01/16 1443 10/01/16 2139 10/02/16 0548 10/02/16 1510    BP: 123/87 121/80 (!) 119/93 (!) 117/96  Pulse: 87 70 88 100  Resp: 16 18 18 18   Temp: 97.4 F (36.3 C) 98.1 F (36.7 C) 97.7 F (36.5 C) 97.8 F (36.6 C)  TempSrc: Oral Oral Oral Oral  SpO2: 100% 99% 99% 100%  Weight:      Height:        Examination:  General exam: Pleasant young female, Sitting up comfortably in bed. Respiratory system: Clear to auscultation. Respiratory effort normal. Stable. Cardiovascular system: S1 & S2 heard, RRR. No JVD, murmurs, rubs, gallops or clicks. No pedal edema. Telemetry: Sinus rhythm. Gastrointestinal system: Abdomen is nondistended, soft. ??diffuse mild tenderness/inconsistent(many times starts to complain of pain even before abdomen is touched) but no obvious rigidity, guarding or rebound. No organomegaly or masses felt. Normal bowel sounds heard. Central nervous system: Alert and oriented 3. No focal neurological deficits. Extremities: Symmetric 5 x 5 power. Skin: No rashes, lesions or ulcers Psychiatry: Judgement and insight appear impaired. Mood & affect appears pleasant and appropriate.    Data Reviewed: I have personally reviewed following labs and imaging studies  CBC:  Recent Labs Lab 09/29/16 1142 10/01/16 1855  WBC 11.7* 7.5  NEUTROABS 7.5  --   HGB 15.6* 11.8*  HCT 45.4 35.7*  MCV 90.4 91.8  PLT 402* 272   Basic Metabolic Panel:  Recent Labs Lab 09/29/16 1142 09/30/16 0752 10/01/16 1855 10/02/16 0758  NA 137 140 140 137  K 4.2 3.9 3.7 5.4*  CL 104 114* 113* 110  CO2 18* 16* 19* 21*  GLUCOSE 141* 107* 101* 105*  BUN 29* 22* 14 12  CREATININE 2.20* 1.84* 1.61* 1.62*  CALCIUM 11.1* 8.8* 9.1 9.0   CBG:  Recent Labs Lab 10/01/16 1225 10/01/16 1628 10/01/16 2057 10/02/16 1215 10/02/16 1641  GLUCAP 100* 118* 97 125* 113*    Recent Results (from the past  240 hour(s))  Gastrointestinal Panel by PCR , Stool     Status: None   Collection Time: 09/29/16  2:30 PM  Result Value Ref Range Status    Campylobacter species NOT DETECTED NOT DETECTED Final   Plesimonas shigelloides NOT DETECTED NOT DETECTED Final   Salmonella species NOT DETECTED NOT DETECTED Final   Yersinia enterocolitica NOT DETECTED NOT DETECTED Final   Vibrio species NOT DETECTED NOT DETECTED Final   Vibrio cholerae NOT DETECTED NOT DETECTED Final   Enteroaggregative E coli (EAEC) NOT DETECTED NOT DETECTED Final   Enteropathogenic E coli (EPEC) NOT DETECTED NOT DETECTED Final   Enterotoxigenic E coli (ETEC) NOT DETECTED NOT DETECTED Final   Shiga like toxin producing E coli (STEC) NOT DETECTED NOT DETECTED Final   Shigella/Enteroinvasive E coli (EIEC) NOT DETECTED NOT DETECTED Final   Cryptosporidium NOT DETECTED NOT DETECTED Final   Cyclospora cayetanensis NOT DETECTED NOT DETECTED Final   Entamoeba histolytica NOT DETECTED NOT DETECTED Final   Giardia lamblia NOT DETECTED NOT DETECTED Final   Adenovirus F40/41 NOT DETECTED NOT DETECTED Final   Astrovirus NOT DETECTED NOT DETECTED Final   Norovirus GI/GII NOT DETECTED NOT DETECTED Final   Rotavirus A NOT DETECTED NOT DETECTED Final   Sapovirus (I, II, IV, and V) NOT DETECTED NOT DETECTED Final  C difficile quick scan w PCR reflex     Status: None   Collection Time: 09/29/16  2:30 PM  Result Value Ref Range Status   C Diff antigen NEGATIVE NEGATIVE Final   C Diff toxin NEGATIVE NEGATIVE Final   C Diff interpretation No C. difficile detected.  Final         Radiology Studies: No results found.      Scheduled Meds: . atorvastatin  20 mg Per Tube q1800  . folic acid  1 mg Intravenous Daily  . haloperidol  5 mg Per Tube QHS  . lamoTRIgine  200 mg Per Tube Daily  . levothyroxine  150 mcg Per Tube QAC breakfast  . loratadine  10 mg Per Tube Daily  . lurasidone  20 mg Per Tube Q breakfast  . Melatonin  9 mg Tube Q2000  . multivitamin  15 mL Per Tube Daily  . pantoprazole (PROTONIX) IV  40 mg Intravenous Q12H  . QUEtiapine  150 mg Oral QHS  . thiamine   100 mg Intravenous Daily  . topiramate  50 mg Per Tube BID  . venlafaxine XR  150 mg Oral Q breakfast   Continuous Infusions: . feeding supplement (OSMOLITE 1.2 CAL) 1,000 mL (10/01/16 1800)     LOS: 3 days    Tasean Mancha, MD, FACP, FHM. [facility] Pager [phone]  If 7PM-7AM, please contact night-coverage [email or web address] Password [facility] 10/02/2016, 4:49 PM

## 2016-10-02 NOTE — Progress Notes (Signed)
Pt states she dose not want to be woken up during the night for vitals, CBGs, neuro checks or any other reason.

## 2016-10-03 ENCOUNTER — Encounter (HOSPITAL_COMMUNITY): Payer: Self-pay | Admitting: Physician Assistant

## 2016-10-03 DIAGNOSIS — R112 Nausea with vomiting, unspecified: Secondary | ICD-10-CM

## 2016-10-03 DIAGNOSIS — G8929 Other chronic pain: Secondary | ICD-10-CM

## 2016-10-03 DIAGNOSIS — R1013 Epigastric pain: Secondary | ICD-10-CM

## 2016-10-03 LAB — BASIC METABOLIC PANEL
Anion gap: 8 (ref 5–15)
BUN: 14 mg/dL (ref 6–20)
CALCIUM: 9 mg/dL (ref 8.9–10.3)
CHLORIDE: 106 mmol/L (ref 101–111)
CO2: 23 mmol/L (ref 22–32)
CREATININE: 1.67 mg/dL — AB (ref 0.44–1.00)
GFR, EST AFRICAN AMERICAN: 44 mL/min — AB (ref >60)
GFR, EST NON AFRICAN AMERICAN: 38 mL/min — AB (ref >60)
Glucose, Bld: 110 mg/dL — ABNORMAL HIGH (ref 65–99)
Potassium: 3.9 mmol/L (ref 3.5–5.1)
SODIUM: 137 mmol/L (ref 135–145)

## 2016-10-03 LAB — GLUCOSE, CAPILLARY
GLUCOSE-CAPILLARY: 105 mg/dL — AB (ref 65–99)
GLUCOSE-CAPILLARY: 105 mg/dL — AB (ref 65–99)
Glucose-Capillary: 110 mg/dL — ABNORMAL HIGH (ref 65–99)
Glucose-Capillary: 100 mg/dL — ABNORMAL HIGH (ref 65–99)
Glucose-Capillary: 113 mg/dL — ABNORMAL HIGH (ref 65–99)

## 2016-10-03 MED ORDER — VITAMIN B-1 100 MG PO TABS
100.0000 mg | ORAL_TABLET | Freq: Every day | ORAL | Status: DC
  Administered 2016-10-04 – 2016-10-08 (×4): 100 mg
  Filled 2016-10-03 (×5): qty 1

## 2016-10-03 MED ORDER — SODIUM CHLORIDE 0.9 % IV SOLN
8.0000 mg | Freq: Three times a day (TID) | INTRAVENOUS | Status: DC | PRN
  Filled 2016-10-03: qty 4

## 2016-10-03 MED ORDER — PROMETHAZINE HCL 25 MG/ML IJ SOLN
25.0000 mg | Freq: Three times a day (TID) | INTRAMUSCULAR | Status: DC | PRN
  Administered 2016-10-03 – 2016-10-04 (×3): 25 mg via INTRAVENOUS
  Filled 2016-10-03 (×3): qty 1

## 2016-10-03 MED ORDER — FOLIC ACID 1 MG PO TABS
1.0000 mg | ORAL_TABLET | Freq: Every day | ORAL | Status: DC
  Administered 2016-10-04 – 2016-10-08 (×4): 1 mg
  Filled 2016-10-03 (×5): qty 1

## 2016-10-03 MED ORDER — DOCUSATE SODIUM 50 MG/5ML PO LIQD
100.0000 mg | Freq: Two times a day (BID) | ORAL | Status: DC
  Administered 2016-10-03 – 2016-10-07 (×8): 100 mg
  Filled 2016-10-03 (×10): qty 10

## 2016-10-03 MED ORDER — PANTOPRAZOLE SODIUM 40 MG PO PACK
40.0000 mg | PACK | Freq: Two times a day (BID) | ORAL | Status: DC
  Administered 2016-10-03 – 2016-10-07 (×8): 40 mg
  Filled 2016-10-03 (×10): qty 20

## 2016-10-03 MED ORDER — BOOST / RESOURCE BREEZE PO LIQD
1.0000 | Freq: Three times a day (TID) | ORAL | Status: DC
  Administered 2016-10-03 – 2016-10-11 (×23): 1 via ORAL

## 2016-10-03 MED ORDER — DOCUSATE SODIUM 50 MG/5ML PO LIQD
100.0000 mg | Freq: Two times a day (BID) | ORAL | Status: DC
  Administered 2016-10-03: 100 mg via ORAL
  Filled 2016-10-03: qty 10

## 2016-10-03 NOTE — Consult Note (Addendum)
Consultation  Referring Provider: Vernell Leep, MD     Primary Care Physician:  Vickii Penna, MD Primary Gastroenterologist: Silvano Rusk, MD        Reason for Consultation: Intractable N/V         Impression / Plan:   1. Intractable nausea and vomiting with epigastric pain: With history of severe eating disorder and GERD. Tube feedings do not exacerbate her nausea or cause vomiting. With normal abdominal films, it is unlikely that she has any obstruction. Consider EGD vs gastric emptying study.   2.? Dysphagia: Could be related to GERD or her longstanding anxiety. Consider EGD vs barium esophagram +/- upper GI series.   3. Mild normocytic anemia: Current Hg at baseline.   4. Protein calorie malnutrition: Followed by outpatient dietitian.    I have personally seen the patient, reviewed and repeated key elements of the history and physical and participated in formation of the assessment and plan the student has documented.   The history changes and not entirely clear she has dysphagia. It is highly unlikely that an EGD would add any new significant information but could be needed to make sure there are no mucosal or other structural changes that would be contributing. At this point will try scheduled Zofran 8 mg q6. I suspect she has some globus like sxs and anxiety can do that. She has been off her psych meds and that may be playing a role - levels not back up. She is concerned about losing the enteric tube and having it replaced if she has an EGD.  ? If mirtazapine is an option - helps nausea and stimulates appetite.  Would have to defer to psychiatry - complicated situation overall.  Topiramate makes people eat less but w/ sz d/o probably cannot change  I dced hemoccults - a heme + stool will not change anything we do here. Will f/u.  I appreciate the opportunity to care for this patient. Gatha Mayer, MD, [facility] [phone]  (pager) [phone] after 5 PM, weekends and holidays  10/03/2016 5:58 PM     HPI:   Brandi Love is a 41 y.o. female with extensive medical and psychiatric history (see list below), including severe eating disorder with anorexia, bulimia, and laxative abuse. She also reports a history of GERD, for which she had not been taking her prescribed PPI before being admitted to the hospital. As reported by patient and noted by hospitalist, she has had no oral intake for 27 days. Last bowel movement was two days ago, which she described as "soft." She reports epigastric pain and constant nausea, which is somewhat alleviated by Zofran, and exacerbated by PO intake. Every time she tries to eat or drink "it comes right back up." She also reports difficulty with swallowing, "like there's a bulb in my throat." Tube feedings reportedly do not make her nauseous or vomit. When asked whether this is a new problem, or different from her established problems, she reported that this was not new but seemed "worse than usual."  Past Medical History:  Diagnosis Date  . Anemia   . Anxiety   . Arthritis    "hands" (09/18/2016)  . Chronic lower back pain   . Chronic renal insufficiency   . Deafness   . Depression   . Diabetes ([place])    "borderline"  . Eating disorder   . Endometriosis   . GERD (gastroesophageal reflux disease)   . History of kidney stones   .  Hyperlipidemia   . Hypertension   . 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 Migraine    "frequency varies" (09/18/2016)  . OCD (obsessive compulsive disorder)   . Pneumonia 2011  . PONV (postoperative nausea and vomiting)   . Pulmonary embolism ([place]) 10/2001  . Seizures ([place]) 09/2001 X 1   "day after my hysterectomy"  . Tachycardia     Past Surgical History:  Procedure Laterality Date  . ABDOMINAL HYSTERECTOMY  09/2001   'except for right ovary'  . BREAST LUMPECTOMY Left 2000   benign  . BREAST LUMPECTOMY Right 2002   benign  .  COCHLEAR IMPLANT Bilateral 2004-2010   "right-left"  . EYE MUSCLE SURGERY Left 1980   "lazy eye"  . EYE MUSCLE SURGERY Right 1990   "lazy eye"  . FLEXIBLE SIGMOIDOSCOPY  06/2015   Dr Gerilyn Nestle.    Marland Kitchen LAPAROSCOPIC ABDOMINAL EXPLORATION  2001 and 2002   for endometriosis    Family History  Problem Relation Age of Onset  . Cancer Father   . Hyperlipidemia Father   . Osteoarthritis Mother   . Hyperlipidemia Mother      Social History  Substance Use Topics  . Smoking status: Never Smoker  . Smokeless tobacco: Never Used  . Alcohol use No    Prior to Admission medications   Medication Sig Start Date End Date Taking? Authorizing Provider  aspirin EC 81 MG tablet Take 81 mg by mouth at bedtime.     [provider]  atorvastatin (LIPITOR) 20 MG tablet Take 20 mg by mouth daily.    [provider]  cetirizine (ZYRTEC) 10 MG tablet Take 10 mg by mouth daily.    [provider]  cyclobenzaprine (FLEXERIL) 10 MG tablet Take 1 tablet (10 mg total) by mouth 2 (two) times daily as needed for muscle spasms. Patient not taking: Reported on 09/29/2016 07/17/15   Mackuen, Courteney Lyn, MD  docusate sodium (COLACE) 100 MG capsule Take 100 mg by mouth 2 (two) times daily.    [provider]  haloperidol (HALDOL) 5 MG tablet Take 5 mg by mouth at bedtime.     [provider]  hydrOXYzine (ATARAX/VISTARIL) 50 MG tablet Take 50 mg by mouth 3 (three) times daily as needed.    [provider]  lamoTRIgine (LAMICTAL) 200 MG tablet Take 200 mg by mouth daily.    [provider]  levothyroxine (SYNTHROID, LEVOTHROID) 150 MCG tablet Take 150 mcg by mouth daily.    [provider]  lurasidone (LATUDA) 20 MG TABS tablet Take 20 mg by mouth daily.     [provider]  magnesium oxide (MAG-OX) 400 MG tablet Take 400 mg by mouth 2 (two) times daily.     [provider]  Melatonin 10 MG TABS Take 10 mg by mouth at bedtime.     [provider]  metoprolol succinate (TOPROL-XL) 25 MG 24 hr tablet Take 25 mg by mouth every evening.     [provider]  Multiple Vitamin (MULTIVITAMIN WITH MINERALS) TABS Take 1 tablet by mouth daily.    [provider]  ondansetron (ZOFRAN) 4 MG tablet Take 4 mg by mouth every 8 (eight) hours as needed for nausea.    [provider]  pantoprazole (PROTONIX) 40 MG tablet Take 40 mg by mouth daily.    [provider]  promethazine (PHENERGAN) 25 MG tablet Take 1 tablet (25 mg total) by mouth every 6 (six) hours  as needed for nausea or vomiting. Patient not taking: Reported on 09/29/2016 06/24/14   Alvina Chou, PA-C  QUEtiapine Fumarate (SEROQUEL XR) 150 MG 24 hr tablet Take 150 mg by mouth at bedtime.    [provider]  SUMAtriptan (IMITREX) 100 MG tablet Take 100 mg by mouth every 2 (two) hours as needed for migraine.    [provider]  topiramate (TOPAMAX) 50 MG tablet Take 50 mg by mouth 2 (two) times daily.     [provider]  venlafaxine XR (EFFEXOR-XR) 150 MG 24 hr capsule Take 150 mg by mouth daily with breakfast.    [provider]  Vitamin D, Ergocalciferol, (DRISDOL) 50000 UNITS CAPS Take 50,000 Units by mouth every 7 (seven) days. On Sunday    [provider]    Current Facility-Administered Medications  Medication Dose Route Frequency Provider Last Rate Last Dose  . acetaminophen (TYLENOL) tablet 650 mg  650 mg Oral Q6H PRN Samella Parr, NP       Or  . acetaminophen (TYLENOL) suppository 650 mg  650 mg Rectal Q6H PRN Samella Parr, NP      . atorvastatin (LIPITOR) tablet 20 mg  20 mg Per Tube q1800 Modena Jansky, MD   20 mg at 10/02/16 1812  . docusate (COLACE) 50 MG/5ML liquid 100 mg  100 mg Per Tube BID Hongalgi, Lenis Dickinson, MD      . feeding supplement (BOOST / RESOURCE BREEZE) liquid 1 Container  1 Container Oral TID BM Modena Jansky, MD   1 Container at 10/03/16 1400    . feeding supplement (OSMOLITE 1.2 CAL) liquid 1,000 mL  1,000 mL Per Tube Continuous Modena Jansky, MD 60 mL/hr at 10/03/16 1155 1,000 mL at 10/03/16 1155  . [START ON 1/82/9937] folic acid (FOLVITE) tablet 1 mg  1 mg Per Tube Daily Hongalgi, Anand D, MD      . haloperidol (HALDOL) tablet 5 mg  5 mg Per Tube QHS Modena Jansky, MD   5 mg at 10/02/16 2157  . hydrOXYzine (ATARAX/VISTARIL) tablet 50 mg  50 mg Per Tube TID PRN Modena Jansky, MD   50 mg at 10/01/16 1739  . lamoTRIgine (LAMICTAL) tablet 200 mg  200 mg Per Tube Daily Modena Jansky, MD   200 mg at 10/03/16 1696  . levothyroxine (SYNTHROID, LEVOTHROID) tablet 150 mcg  150 mcg Per Tube QAC breakfast Modena Jansky, MD   150 mcg at 10/03/16 7893  . loratadine (CLARITIN) tablet 10 mg  10 mg Per Tube Daily Hongalgi, Anand D, MD      . lurasidone (LATUDA) tablet 20 mg  20 mg Per Tube Q breakfast Modena Jansky, MD   20 mg at 10/03/16 8101  . Melatonin TABS 9 mg  9 mg Tube Q2000 Hongalgi, Anand D, MD      . morphine 4 MG/ML injection 1-4 mg  1-4 mg Intravenous Q2H PRN Samella Parr, NP   2 mg at 10/03/16 1208  . multivitamin liquid 15 mL  15 mL Per Tube Daily Modena Jansky, MD   15 mL at 10/03/16 0928  . ondansetron (ZOFRAN) injection 4 mg  4 mg Intravenous Q6H PRN Samella Parr, NP   4 mg at 10/03/16 7510   Or  . promethazine (PHENERGAN) injection 25 mg  25 mg Intravenous Q6H PRN Samella Parr, NP      . pantoprazole sodium (PROTONIX) 40 mg/20 mL oral suspension 40  mg  40 mg Per Tube BID Hongalgi, Anand D, MD      . phenol (CHLORASEPTIC) mouth spray 1 spray  1 spray Mouth/Throat PRN Modena Jansky, MD   1 spray at 09/30/16 1329  . QUEtiapine (SEROQUEL XR) 24 hr tablet 150 mg  150 mg Oral QHS Hongalgi, Lenis Dickinson, MD      . Derrill Memo ON 10/04/2016] thiamine (VITAMIN B-1) tablet 100 mg  100 mg Per Tube Daily Hongalgi, Anand D, MD      . topiramate (TOPAMAX) tablet 50 mg  50 mg Per Tube BID Modena Jansky, MD    50 mg at 10/03/16 2353  . venlafaxine XR (EFFEXOR-XR) 24 hr capsule 150 mg  150 mg Oral Q breakfast Modena Jansky, MD   150 mg at 10/03/16 6144    Allergies as of 09/29/2016 - Review Complete 09/29/2016  Allergen Reaction Noted  . Dhea [nutritional supplements] Nausea And Vomiting 07/10/2012  . Entex lq [phenylephrine-guaifenesin] Shortness Of Breath and Swelling 07/10/2012  . Indomethacin Nausea And Vomiting 05/06/2016  . Iodinated diagnostic agents Anaphylaxis 07/10/2012  . Melatonin Itching 09/29/2016  . Propoxyphene Anaphylaxis 05/06/2016  . Risperidone and related Other (See Comments) 02/08/2015  . Shellfish allergy Other (See Comments) 08/15/2016  . Depakote [divalproex sodium] Nausea And Vomiting and Other (See Comments) 05/06/2016  . Abilify [aripiprazole] Nausea And Vomiting 07/10/2012  . Dihydroergotamine Nausea And Vomiting 07/10/2012  . Doxycycline Nausea And Vomiting 08/12/2012  . Erythromycin Nausea And Vomiting 07/10/2012  . Lactose intolerance (gi) Nausea And Vomiting 07/10/2012  . Nsaids Other (See Comments) 08/15/2012  . Tape Other (See Comments) 07/10/2012     Review of Systems:    This is positive for those things mentioned in the HPI. All other review of systems are negative.       Physical Exam:  Vital signs in last 24 hours: Temp:  [98.6 F (37 C)-99 F (37.2 C)] 98.6 F (37 C) (06/14 1508) Pulse Rate:  [98-109] 109 (06/14 1508) Resp:  [16-17] 17 (06/14 1508) BP: (116-129)/(92) 116/92 (06/14 1508) SpO2:  [99 %-100 %] 99 % (06/14 1508) Weight:  [163 lb 4.8 oz (74.1 kg)] 163 lb 4.8 oz (74.1 kg) (06/14 0829) Last BM Date: 10/01/16  General:  Notably not kachectic in appearance. Friendly and cooperative. No acute distress Stuffed animal in bed Eyes:  anicteric. ENT:   Mouth and posterior pharynx free of lesions.  Neck:   supple w/o thyromegaly or mass.  Lungs: Clear to auscultation bilaterally. Heart:  S1S2, no rubs, murmurs,  gallops. Abdomen:  Very tender in epigastric. Hypoactive BS. Soft, no hepatosplenomegaly, hernia, or mass. Rectal: Not done Lymph:  no cervical or supraclavicular adenopathy. Extremities:   no edema Skin   no rash. Scars from self-inflicted cuts on forearms Neuro:  A&O x 3.  Psych:  appropriate mood and  Affect.   Data Reviewed:   LAB RESULTS:  Recent Labs  10/01/16 1855  WBC 7.5  HGB 11.8*  HCT 35.7*  PLT 272   BMET  Recent Labs  10/01/16 1855 10/02/16 0758 10/03/16 0436  NA 140 137 137  K 3.7 5.4* 3.9  CL 113* 110 106  CO2 19* 21* 23  GLUCOSE 101* 105* 110*  BUN 14 12 14   CREATININE 1.61* 1.62* 1.67*  CALCIUM 9.1 9.0 9.0      PREVIOUS ENDOSCOPIES:            2017 Colonoscopy for rectal bleeding, showed small internal hemorrhoids and anal fissure.  Patient reported previous EGD "some time between 2007-2011" but we have no record.    Thanks  Janeece Riggers, PA-S   LOS: 4 days   @Teyon Odette  Simonne Maffucci, MD, [facility] @  10/03/2016, 4:42 PM

## 2016-10-03 NOTE — Progress Notes (Signed)
Nutrition Follow-up  DOCUMENTATION CODES:   Not applicable  INTERVENTION:   -Boost Breeze po TID, each supplement provides 250 kcal and 9 grams of protein -Continue Osmolite 1.2@ 102ml/hr via cortrak tube   Tube feeding regimen provides 1728kcal (100% of needs), 80grams of protein, and 1143ml of H2O.   NUTRITION DIAGNOSIS:   Inadequate oral intake related to nausea, vomiting, chronic illness (eating disorder) as evidenced by meal completion < 25%, per patient/family report.  Ongoing  GOAL:   Patient will meet greater than or equal to 90% of their needs  Progressing  MONITOR:   PO intake, Diet advancement, Labs, Weight trends, TF tolerance, Skin, I & O's  REASON FOR ASSESSMENT:   Consult, Malnutrition Screening Tool Enteral/tube feeding initiation and management  ASSESSMENT:   Brandi Love is a 41 y.o. female with medical history significant for congenital hypothyroidism, hypertriglyceridemia with dyslipidemia, major depressive disorder with OCD and severe eating disorder manifested by bulimia and history of laxative abuse. She also has a history of endometriosis status post partial hysterectomy, reflux, hypertension, remote PE, and genital deafness, stage III chronic kidney disease and overactive bladder as well as anemia of chronic disease. She was recently discharged on 6/8 And admission for sepsis related to pyelonephritis.  Pt re-evaluated per request from RN.   Pt just advanced to a full liquid diet for lunch. Pt reports she is able to take some oral intake, but still complains of nausea and abdominal pain. She is consuming gingergale to help relieve nausea. Informed pt of diet advancement; pt only agreeable to eat jello, gingerale, and mango ice pops at this time. Offered Ensure, but pt refused, stating she does not think she will tolerate. Pt amenable to try peach Boost Breeze, due to it being a clear liquid consistency.   Nutrition-Focused physical exam  completed. Findings are no fat depletion, no muscle depletion, and no edema. Hair, skin, and nails examined and not signs of micronutrient deficiencies noted.   Noted 8# wt gain (5.2%) wt gain over the past 4 days.   Pt remains on enteral nutrition support via post-pyloric cortrak tube. Osmolite 1.2 is infusing at goal rate of 60 ml/hr via cortrak tube; tolerating well per RN.   Labs reviewed: CBGS: 100-115.   Diet Order:  Diet full liquid Room service appropriate? Yes; Fluid consistency: Thin  Skin:  Reviewed, no issues  Last BM:  10/01/16  Height:   Ht Readings from Last 1 Encounters:  10/03/16 5\' 8"  (1.727 m)    Weight:   Wt Readings from Last 1 Encounters:  10/03/16 163 lb 4.8 oz (74.1 kg)    Ideal Body Weight:  63.6 kg  BMI:  Body mass index is 24.83 kg/m.  Estimated Nutritional Needs:   Kcal:  1650-1850  Protein:  80-95 grams  Fluid:  >1.6 L  EDUCATION NEEDS:   Education needs no appropriate at this time  Adella Manolis A. Jimmye Norman, RD, LDN, CDE Pager: [phone] After hours Pager: [phone]

## 2016-10-03 NOTE — Progress Notes (Signed)
Patient refused to check her cbg for midnight and she  also refused to check her vital signs for this morning at 6 o clock.

## 2016-10-03 NOTE — Progress Notes (Signed)
PROGRESS NOTE   Brandi Love  JSH:[id number]    DOB: [birth year]    DOA: 09/29/2016  PCP: Vickii Penna, MD   I have briefly reviewed patients previous medical records in [facility].  Brief Narrative:  41 year old female with PMH of anemia, anxiety and depression, chronic kidney disease, borderline DM, severe eating disorder/bulimia/laxative abuse history, OCD, GERD, HTN, HLD, congenital hypothyroid, hearing loss/cochlear implant, remote PE, seizure, recent hospitalization 09/18/16-09/27/16 for sepsis due to pyelonephritis, hypotension, acute psychosis and evaluated by psychiatry, needed short-term IVC, stabilized and discharged home, return to ED on 09/29/16 reporting that she had not eaten anything since that discharge (stated that she had not eaten for 27 days on admission), intractable nausea and vomiting, inability to keep anything by mouth, diffuse abdominal cramping and blood or mucus in stools. She had similar episode in 2017 when she underwent colonoscopy that revealed small internal hemorrhoids and a small anal fissure but no significant abnormalities. Admitted for intractable nausea, vomiting, associated dehydration, acute kidney injury and metabolic acidosis. Case discussed with her outpatient eating disorder dietitian and recommended tube feedings. Cor track placed and tube feeding initiated 6/11. Patient more cooperative with taking her medications via NG tube. Tolerating some oral clear liquids and willing to try full liquids.  Assessment & Plan:   Principal Problem:   Acute kidney injury ([place]) Active Problems:   OCD (obsessive compulsive disorder)   Eating disorder   Deafness congenital   Intractable nausea and vomiting   Bloody diarrhea   AKI (acute kidney injury) ([place])   Severe dehydration   Severe protein-calorie malnutrition (HCC)   Congenital hypothyroidism   Hypertriglyceridemia   Anemia   Endometriosis   MDD (major depressive disorder)   Hyperlipidemia   CKD  (chronic kidney disease), stage III   OAB (overactive bladder)   1. Intractable nausea and vomiting: In the setting of severe eating disorder, laxative abuse and apparent bulimia. As per patient's report, has been unable to tolerate any oral intake for 27 days on admission. Admitting team discussed with her outpatient eating disorder nutritionist who recommended tube feedings. Cor track was placed on 6/11 and continue postpyloric tube feeding, tolerating same. Continue IV PPI and when necessary IV antiemetics. Discussed in detail with patient's outpatient RD Ms. Lynden Ang on 6/12 >she indicated that patient's recent eating disorder relapse has drastically affected her mental and physical health and strongly recommends ongoing inpatient care including tube feedings until patient is able to gradually tolerate adequate oral intake to sustain nutritional needs prior to considering discharge. Search ongoing for inpatient eating disorder facility placement. [facility] consulted to see if there are any mechanical causes for her symptoms. Patient reported that she had prior EGD in our system but do not see one nor a GI consult. 2. Dehydration: Secondary to GI losses and poor oral intake. IV fluids and tube feeding. Dehydration resolved. 3. Acute kidney injury complicating stage III chronic kidney disease: Baseline creatinine not known but may be in the 1.5-1.6. Presented with creatinine of 2.2. Secondary to dehydration. Creatinine has plateaued at 1.6 which may be her baseline. Hyperkalemia resolved. Periodically follow BMP. 4. Non-anion gap metabolic acidosis: Secondary to GI losses. Resolved. 5. Blood and mucus in stools: Reviewed stool in bedside commode on 6/11 and no further blood noted. Continue to monitor. C. difficile testing negative. Abdominal x-ray without acute findings. Started empirically on Flagyl on admission,? DC. No further blood in stools. Diarrhea seems to be improving. C. difficile testing  and  GI pathogen panel PCR negative-discontinued Flagyl. Resolved. 6. Psychiatry (severe eating disorder/anxiety/depression/OCD/borderline personality disorder): Evaluated by psychiatry during previous admission. Documented reluctance in the past to take psychotropic medications despite professed desire to improve. No acute psychiatric issues at this time. Social worker consulted for placement to inpatient eating disorder facility at discharge. Psychiatry consultation appreciated, recommend continued current treatment. Patient now taking her medications. 7. Seizure disorder: Reviewed her PTA home medication list in detail with pharmacist and resume as many as meds via tube feed or one or 2 oral as tolerated. States that she had a remote seizure 1 due to "overdose of pain medications" that were given to her. Patient now taking her medications. 8. Essential hypertension: Toprol held due to soft blood pressures in the context of dehydration. Stable. 9. Congenital hypothyroidism: Followed by endocrinology at [facility]. Continue Synthroid. 10. Congenital deafness/cochlear implants 11. Hyperlipidemia: Resume statins when able to take. 12. Anemia: Likely hemoconcentration on admission. Stable. 13. Overactive bladder: 14. Borderline DM: Not on medications at home. Most recent A1c: 5.5.   DVT prophylaxis: DC Lovenox due to reported blood in stools on admission. SCDs. Code Status: Full Family Communication: None at bedside Disposition: DC when medically stable   Consultants:  Psychiatry  Procedures:  Cor track placed 6/11  Antimicrobials:  Flagyl    Subjective: Seen this morning. Has been trying some clear liquids. One or 2 occasions of food regurgitating through the throat. Intermittent nausea. Willing to try full liquids.  ROS: No suicidal or homicidal ideations or auditory or visual hallucinations reported.  Objective:  Vitals:   10/02/16 0548 10/02/16 1510 10/02/16 2112 10/03/16 0829  BP:  (!) 119/93 (!) 117/96 (!) 129/92   Pulse: 88 100 98   Resp: 18 18 16    Temp: 97.7 F (36.5 C) 97.8 F (36.6 C) 99 F (37.2 C)   TempSrc: Oral Oral Oral   SpO2: 99% 100% 100%   Weight:    74.1 kg (163 lb 4.8 oz)  Height:    5\' 8"  (1.727 m)    Examination:  General exam: Pleasant young female, moderately built and nourished, lying comfortably propped up in bed. Respiratory system: Clear to auscultation without increased work of breathing. Cardiovascular system: S1 and S2 heard, RRR. No JVD murmurs or pedal edema. Telemetry: Sinus rhythm. Gastrointestinal system: Nondistended, soft, mild diffuse tenderness but cannot be sure because of inconsistent exam. Normal bowel sounds heard. Central nervous system: Alert and oriented 3. No focal neurological deficits. Hard of hearing. Extremities: Symmetric 5 x 5 power. Skin: No rashes, lesions or ulcers Psychiatry: Judgement and insight appear impaired. Mood & affect appears pleasant and appropriate.    Data Reviewed: I have personally reviewed following labs and imaging studies  CBC:  Recent Labs Lab 09/29/16 1142 10/01/16 1855  WBC 11.7* 7.5  NEUTROABS 7.5  --   HGB 15.6* 11.8*  HCT 45.4 35.7*  MCV 90.4 91.8  PLT 402* 825   Basic Metabolic Panel:  Recent Labs Lab 09/29/16 1142 09/30/16 0752 10/01/16 1855 10/02/16 0758 10/03/16 0436  NA 137 140 140 137 137  K 4.2 3.9 3.7 5.4* 3.9  CL 104 114* 113* 110 106  CO2 18* 16* 19* 21* 23  GLUCOSE 141* 107* 101* 105* 110*  BUN 29* 22* 14 12 14   CREATININE 2.20* 1.84* 1.61* 1.62* 1.67*  CALCIUM 11.1* 8.8* 9.1 9.0 9.0   CBG:  Recent Labs Lab 10/02/16 1641 10/02/16 2044 10/03/16 0441 10/03/16 0842 10/03/16 1207  GLUCAP 113* 115* 105* 110*  100*    Recent Results (from the past 240 hour(s))  Gastrointestinal Panel by PCR , Stool     Status: None   Collection Time: 09/29/16  2:30 PM  Result Value Ref Range Status   Campylobacter species NOT DETECTED NOT DETECTED Final     Plesimonas shigelloides NOT DETECTED NOT DETECTED Final   Salmonella species NOT DETECTED NOT DETECTED Final   Yersinia enterocolitica NOT DETECTED NOT DETECTED Final   Vibrio species NOT DETECTED NOT DETECTED Final   Vibrio cholerae NOT DETECTED NOT DETECTED Final   Enteroaggregative E coli (EAEC) NOT DETECTED NOT DETECTED Final   Enteropathogenic E coli (EPEC) NOT DETECTED NOT DETECTED Final   Enterotoxigenic E coli (ETEC) NOT DETECTED NOT DETECTED Final   Shiga like toxin producing E coli (STEC) NOT DETECTED NOT DETECTED Final   Shigella/Enteroinvasive E coli (EIEC) NOT DETECTED NOT DETECTED Final   Cryptosporidium NOT DETECTED NOT DETECTED Final   Cyclospora cayetanensis NOT DETECTED NOT DETECTED Final   Entamoeba histolytica NOT DETECTED NOT DETECTED Final   Giardia lamblia NOT DETECTED NOT DETECTED Final   Adenovirus F40/41 NOT DETECTED NOT DETECTED Final   Astrovirus NOT DETECTED NOT DETECTED Final   Norovirus GI/GII NOT DETECTED NOT DETECTED Final   Rotavirus A NOT DETECTED NOT DETECTED Final   Sapovirus (I, II, IV, and V) NOT DETECTED NOT DETECTED Final  C difficile quick scan w PCR reflex     Status: None   Collection Time: 09/29/16  2:30 PM  Result Value Ref Range Status   C Diff antigen NEGATIVE NEGATIVE Final   C Diff toxin NEGATIVE NEGATIVE Final   C Diff interpretation No C. difficile detected.  Final         Radiology Studies: No results found.      Scheduled Meds: . atorvastatin  20 mg Per Tube q1800  . docusate  100 mg Per Tube BID  . feeding supplement  1 Container Oral TID BM  . [START ON 08/07/4079] folic acid  1 mg Per Tube Daily  . haloperidol  5 mg Per Tube QHS  . lamoTRIgine  200 mg Per Tube Daily  . levothyroxine  150 mcg Per Tube QAC breakfast  . loratadine  10 mg Per Tube Daily  . lurasidone  20 mg Per Tube Q breakfast  . Melatonin  9 mg Tube Q2000  . multivitamin  15 mL Per Tube Daily  . pantoprazole sodium  40 mg Per Tube BID  .  QUEtiapine  150 mg Oral QHS  . [START ON 10/04/2016] thiamine  100 mg Per Tube Daily  . topiramate  50 mg Per Tube BID  . venlafaxine XR  150 mg Oral Q breakfast   Continuous Infusions: . feeding supplement (OSMOLITE 1.2 CAL) 1,000 mL (10/03/16 1155)     LOS: 4 days    HONGALGI,ANAND, MD, FACP, FHM. [facility] Pager [phone]  If 7PM-7AM, please contact night-coverage [email or web address] Password [email or web address] 10/03/2016, 3:01 PM

## 2016-10-04 DIAGNOSIS — F509 Eating disorder, unspecified: Secondary | ICD-10-CM

## 2016-10-04 LAB — GLUCOSE, CAPILLARY
GLUCOSE-CAPILLARY: 136 mg/dL — AB (ref 65–99)
Glucose-Capillary: 112 mg/dL — ABNORMAL HIGH (ref 65–99)
Glucose-Capillary: 118 mg/dL — ABNORMAL HIGH (ref 65–99)
Glucose-Capillary: 150 mg/dL — ABNORMAL HIGH (ref 65–99)
Glucose-Capillary: 179 mg/dL — ABNORMAL HIGH (ref 65–99)

## 2016-10-04 MED ORDER — DIPHENHYDRAMINE HCL 25 MG PO CAPS
25.0000 mg | ORAL_CAPSULE | ORAL | Status: DC | PRN
  Administered 2016-10-05 – 2016-10-07 (×5): 50 mg via ORAL
  Filled 2016-10-04: qty 2
  Filled 2016-10-04: qty 1
  Filled 2016-10-04 (×4): qty 2

## 2016-10-04 MED ORDER — ONDANSETRON HCL 4 MG/2ML IJ SOLN: 8.0000 mg | Freq: Four times a day (QID) | INTRAMUSCULAR | Status: DC

## 2016-10-04 MED ORDER — SODIUM CHLORIDE 0.9 % IV SOLN
8.0000 mg | Freq: Four times a day (QID) | INTRAVENOUS | Status: DC
  Administered 2016-10-04 – 2016-10-11 (×27): 8 mg via INTRAVENOUS
  Filled 2016-10-04 (×32): qty 4

## 2016-10-04 MED ORDER — PROMETHAZINE HCL 25 MG/ML IJ SOLN
25.0000 mg | Freq: Four times a day (QID) | INTRAMUSCULAR | Status: DC | PRN
  Administered 2016-10-05 – 2016-10-11 (×11): 25 mg via INTRAVENOUS
  Filled 2016-10-04 (×11): qty 1

## 2016-10-04 NOTE — Progress Notes (Signed)
PROGRESS NOTE   Brandi Love  IPJ:[id number]    DOB: 08-Feb-1976    DOA: 09/29/2016  PCP: Vickii Penna, MD   I have briefly reviewed patients previous medical records in [facility].  Brief Narrative:  41 year old female with PMH of anemia, anxiety and depression, chronic kidney disease, borderline DM, severe eating disorder/bulimia/laxative abuse history, OCD, GERD, HTN, HLD, congenital hypothyroid, hearing loss/cochlear implant, remote PE, seizure, recent hospitalization 09/18/16-09/27/16 for sepsis due to pyelonephritis, hypotension, acute psychosis and evaluated by psychiatry, needed short-term IVC, stabilized and discharged home, return to ED on 09/29/16 reporting that she had not eaten anything since that discharge (stated that she had not eaten for 27 days on admission), intractable nausea and vomiting, inability to keep anything by mouth, diffuse abdominal cramping and blood or mucus in stools. She had similar episode in 2017 when she underwent colonoscopy that revealed small internal hemorrhoids and a small anal fissure but no significant abnormalities. Admitted for intractable nausea, vomiting, associated dehydration, acute kidney injury and metabolic acidosis. Case discussed with her outpatient eating disorder dietitian and recommended tube feedings. Cor track placed and tube feeding initiated 6/11. Patient more cooperative with taking her medications via NG tube. Tolerating some oral clear liquids and willing to try full liquids. [facility] input appreciated> possible EGD 6/18  Assessment & Plan:   Principal Problem:   Acute kidney injury ([place]) Active Problems:   OCD (obsessive compulsive disorder)   Eating disorder   Deafness congenital   Intractable nausea and vomiting   Bloody diarrhea   AKI (acute kidney injury) ([place])   Severe dehydration   Severe protein-calorie malnutrition (HCC)   Congenital hypothyroidism   Hypertriglyceridemia   Anemia   Endometriosis   MDD (major  depressive disorder)   Hyperlipidemia   CKD (chronic kidney disease), stage III   OAB (overactive bladder)   1. Intractable nausea and vomiting: In the setting of severe eating disorder, laxative abuse and apparent bulimia. As per patient's report, has been unable to tolerate any oral intake for 27 days on admission. Admitting team discussed with her outpatient eating disorder nutritionist who recommended tube feedings. Cor track was placed on 6/11 and continue postpyloric tube feeding, tolerating same. Continue IV PPI and when necessary IV antiemetics. Discussed in detail with patient's outpatient RD Ms. Lynden Ang on 6/12 >she indicated that patient's recent eating disorder relapse has drastically affected her mental and physical health and strongly recommends ongoing inpatient care including tube feedings until patient is able to gradually tolerate adequate oral intake to sustain nutritional needs prior to considering discharge. Search ongoing for inpatient eating disorder facility placement. [facility] input appreciated> discussed with Dr. Carlean Purl, considering EGD 6/18 to r/o mechanical causes. Patient reported that she had prior EGD in our system but do not see one nor a GI consult. Oral intake not progressing> suspect all psych. 2. Dehydration: Secondary to GI losses and poor oral intake. IV fluids and tube feeding. Dehydration resolved. 3. Acute kidney injury complicating stage III chronic kidney disease: Baseline creatinine not known but may be in the 1.5-1.6. Presented with creatinine of 2.2. Secondary to dehydration. Creatinine has plateaued at 1.6 which may be her baseline. Hyperkalemia resolved. Periodically follow BMP. 4. Non-anion gap metabolic acidosis: Secondary to GI losses. Resolved. 5. Blood and mucus in stools: Reviewed stool in bedside commode on 6/11 and no further blood noted. Continue to monitor. C. difficile testing negative. Abdominal x-ray without acute findings. Started  empirically on Flagyl on admission,?  DC. No further blood in stools. Diarrhea seems to be improving. C. difficile testing and GI pathogen panel PCR negative-discontinued Flagyl. Resolved. 6. Psychiatry (severe eating disorder/anxiety/depression/OCD/borderline personality disorder): Evaluated by psychiatry during previous admission. Documented reluctance in the past to take psychotropic medications despite professed desire to improve. No acute psychiatric issues at this time. Social worker consulted for placement to inpatient eating disorder facility at discharge. Psychiatry consultation appreciated, recommend continued current treatment. Patient now taking her medications. 7. Seizure disorder: Reviewed her PTA home medication list in detail with pharmacist and resume as many as meds via tube feed or one or 2 oral as tolerated. States that she had a remote seizure 1 due to "overdose of pain medications" that were given to her. Patient now taking her medications. 8. Essential hypertension: Toprol held due to soft blood pressures in the context of dehydration. Stable. 9. Congenital hypothyroidism: Followed by [facility]ocr[facility]ology at [facility]. Continue Synthroid. 10. Congenital deafness/cochlear implants 11. Hyperlipidemia: Resume statins when able to take. 12. Anemia: Likely hemoconcentration on admission. Stable. 13. Overactive bladder: 14. Borderline DM: Not on medications at home. Most recent A1c: 5.5.   DVT prophylaxis: DC Lovenox due to reported blood in stools on admission. SCDs. Code Status: Full Family Communication: None at bedside Disposition: DC when medically stable   Consultants:  Psychiatry [facility]  Procedures:  Cor track placed 6/11  Antimicrobials:  Flagyl    Subjective: Ongoing issues with nausea, regurgitation of PO intake, extreme reluctance to take PO. No Headache. Some abdominal pain.  ROS: No suicidal or homicidal ideations or auditory or visual hallucinations  reported.  Objective:  Vitals:   10/03/16 1508 10/03/16 2026 10/04/16 0500 10/04/16 1644  BP: (!) 116/92 121/83  122/74  Pulse: (!) 109 (!) 103  (!) 106  Resp: 17 18  18   Temp: 98.6 F (37 C) 99.1 F (37.3 C)  99.1 F (37.3 C)  TempSrc:    Oral  SpO2: 99% 100%  99%  Weight:   70.5 kg (155 lb 8 oz)   Height:        Examination:  General exam: Pleasant young female, moderately built and nourished, lying comfortably propped up in bed. Respiratory system: Clear to auscultation without increased work of breathing. Cardiovascular system: S1 and S2 heard, RRR. No JVD murmurs or pedal edema. Gastrointestinal system: Nondistended, soft, mild diffuse tenderness but cannot be sure because of inconsistent exam. Normal bowel sounds heard. Central nervous system: Alert and oriented 3. No focal neurological deficits. Hard of hearing. Extremities: Symmetric 5 x 5 power. Skin: No rashes, lesions or ulcers Psychiatry: Judgement and insight appear impaired. Mood & affect appears pleasant and appropriate.    Data Reviewed: I have personally reviewed following labs and imaging studies  CBC:  Recent Labs Lab 09/29/16 1142 10/01/16 1855  WBC 11.7* 7.5  NEUTROABS 7.5  --   HGB 15.6* 11.8*  HCT 45.4 35.7*  MCV 90.4 91.8  PLT 402* 621   Basic Metabolic Panel:  Recent Labs Lab 09/29/16 1142 09/30/16 0752 10/01/16 1855 10/02/16 0758 10/03/16 0436  NA 137 140 140 137 137  K 4.2 3.9 3.7 5.4* 3.9  CL 104 114* 113* 110 106  CO2 18* 16* 19* 21* 23  GLUCOSE 141* 107* 101* 105* 110*  BUN 29* 22* 14 12 14   CREATININE 2.20* 1.84* 1.61* 1.62* 1.67*  CALCIUM 11.1* 8.8* 9.1 9.0 9.0   CBG:  Recent Labs Lab 10/03/16 2024 10/04/16 0029 10/04/16 0623 10/04/16 1120 10/04/16 1718  GLUCAP  113* 150* 112* 179* 118*    Recent Results (from the past 240 hour(s))  Gastrointestinal Panel by PCR , Stool     Status: None   Collection Time: 09/29/16  2:30 PM  Result Value Ref Range Status    Campylobacter species NOT DETECTED NOT DETECTED Final   Plesimonas shigelloides NOT DETECTED NOT DETECTED Final   Salmonella species NOT DETECTED NOT DETECTED Final   Yersinia enterocolitica NOT DETECTED NOT DETECTED Final   Vibrio species NOT DETECTED NOT DETECTED Final   Vibrio cholerae NOT DETECTED NOT DETECTED Final   Enteroaggregative E coli (EAEC) NOT DETECTED NOT DETECTED Final   Enteropathogenic E coli (EPEC) NOT DETECTED NOT DETECTED Final   Enterotoxigenic E coli (ETEC) NOT DETECTED NOT DETECTED Final   Shiga like toxin producing E coli (STEC) NOT DETECTED NOT DETECTED Final   Shigella/Enteroinvasive E coli (EIEC) NOT DETECTED NOT DETECTED Final   Cryptosporidium NOT DETECTED NOT DETECTED Final   Cyclospora cayetanensis NOT DETECTED NOT DETECTED Final   Entamoeba histolytica NOT DETECTED NOT DETECTED Final   Giardia lamblia NOT DETECTED NOT DETECTED Final   Adenovirus F40/41 NOT DETECTED NOT DETECTED Final   Astrovirus NOT DETECTED NOT DETECTED Final   Norovirus GI/GII NOT DETECTED NOT DETECTED Final   Rotavirus A NOT DETECTED NOT DETECTED Final   Sapovirus (I, II, IV, and V) NOT DETECTED NOT DETECTED Final  C difficile quick scan w PCR reflex     Status: None   Collection Time: 09/29/16  2:30 PM  Result Value Ref Range Status   C Diff antigen NEGATIVE NEGATIVE Final   C Diff toxin NEGATIVE NEGATIVE Final   C Diff interpretation No C. difficile detected.  Final         Radiology Studies: No results found.      Scheduled Meds: . atorvastatin  20 mg Per Tube q1800  . docusate  100 mg Per Tube BID  . feeding supplement  1 Container Oral TID BM  . folic acid  1 mg Per Tube Daily  . haloperidol  5 mg Per Tube QHS  . lamoTRIgine  200 mg Per Tube Daily  . levothyroxine  150 mcg Per Tube QAC breakfast  . loratadine  10 mg Per Tube Daily  . lurasidone  20 mg Per Tube Q breakfast  . Melatonin  9 mg Tube Q2000  . multivitamin  15 mL Per Tube Daily  . pantoprazole  sodium  40 mg Per Tube BID  . QUEtiapine  150 mg Oral QHS  . thiamine  100 mg Per Tube Daily  . topiramate  50 mg Per Tube BID  . venlafaxine XR  150 mg Oral Q breakfast   Continuous Infusions: . feeding supplement (OSMOLITE 1.2 CAL) 1,000 mL (10/04/16 0544)  . ondansetron (ZOFRAN) IV       LOS: 5 days    Trena Dunavan, MD, FACP, FHM. [facility] Pager [phone]  If 7PM-7AM, please contact night-coverage [email or web address] Password [email or web address] 10/04/2016, 7:01 PM

## 2016-10-04 NOTE — Progress Notes (Signed)
Patient stated she doesn't want to be awaken through the night.  Patient stated she doesn't want vitals , neuro checks,  Or blood glucose checked throughout the night.  Educated the patient on the importance of assessing these things but she continued to refuse. Will continue to monitor the patient

## 2016-10-04 NOTE — Progress Notes (Signed)
   Patient Name: Brandi Love Date of Encounter: 10/04/2016, 7:45 PM    Subjective  Pretty much the same. Afraid to eat. Still having a lot of nausea. Not sure if she is vomiting. I had changed her Zofran to 6 hours or as scheduled yesterday but somehow the way orders were set up that didn't happen.  Objective  BP 122/74 (BP Location: Left Arm)   Pulse (!) 106   Temp 99.1 F (37.3 C) (Oral)   Resp 18   Ht 5\' 8"  (1.727 m)   Wt 155 lb 8 oz (70.5 kg)   SpO2 99%   BMI 23.64 kg/m  No acute distress. Lying in bed with nasoenteric tube in and stuffed animal in bed.    Assessment and Plan  Chronic intractable nausea and vomiting. Eating disorder. Multiple psychiatric comorbidities complicating things.  I straightened out her Zofran and Phenergan orders. I think it's quite unlikely that there is a gastrointestinal problem causing this. However it may be necessary to do an upper endoscopy to exclude that.  We will reassess June 18. I will keep her nothing by mouth that morning. She has been a bit reluctant to undergo EGD because she does not want to nasoenteric tube replaced.  Gatha Mayer, MD, [facility] [phone] (pager) [phone] after 5 PM, weekends and holidays  10/04/2016 7:45 PM

## 2016-10-05 LAB — GLUCOSE, CAPILLARY
GLUCOSE-CAPILLARY: 161 mg/dL — AB (ref 65–99)
Glucose-Capillary: 119 mg/dL — ABNORMAL HIGH (ref 65–99)
Glucose-Capillary: 166 mg/dL — ABNORMAL HIGH (ref 65–99)

## 2016-10-05 NOTE — Progress Notes (Signed)
PROGRESS NOTE   Brandi Love  CHE:[id number]    DOB: [birth year]    DOA: 09/29/2016  PCP: Vickii Penna, MD   I have briefly reviewed patients previous medical records in [facility].  Brief Narrative:  41 year old female with PMH of anemia, anxiety and depression, chronic kidney disease, borderline DM, severe eating disorder/bulimia/laxative abuse history, OCD, GERD, HTN, HLD, congenital hypothyroid, hearing loss/cochlear implant, remote PE, seizure, recent hospitalization 09/18/16-09/27/16 for sepsis due to pyelonephritis, hypotension, acute psychosis and evaluated by psychiatry, needed short-term IVC, stabilized and discharged home, return to ED on 09/29/16 reporting that she had not eaten anything since that discharge (stated that she had not eaten for 27 days on admission), intractable nausea and vomiting, inability to keep anything by mouth, diffuse abdominal cramping and blood or mucus in stools. She had similar episode in 2017 when she underwent colonoscopy that revealed small internal hemorrhoids and a small anal fissure but no significant abnormalities. Admitted for intractable nausea, vomiting, associated dehydration, acute kidney injury and metabolic acidosis. Case discussed with her outpatient eating disorder dietitian and recommended tube feedings. Cor track placed and tube feeding initiated 6/11. Patient more cooperative with taking her medications via NG tube. Tolerating some oral clear liquids and willing to try full liquids. [facility] input appreciated> possible EGD 6/18  Assessment & Plan:   Principal Problem:   Acute kidney injury ([place]) Active Problems:   OCD (obsessive compulsive disorder)   Eating disorder   Deafness congenital   Intractable nausea and vomiting   Bloody diarrhea   AKI (acute kidney injury) ([place])   Severe dehydration   Severe protein-calorie malnutrition ([facility])   Congenital hypothyroidism   Hypertriglyceridemia   Anemia   Endometriosis   MDD (major  depressive disorder)   Hyperlipidemia   CKD (chronic kidney disease), stage III   OAB (overactive bladder)   1. Intractable nausea and vomiting: In the setting of severe eating disorder, laxative abuse and apparent bulimia. As per patient's report, has been unable to tolerate any oral intake for 27 days on admission. Admitting team discussed with her outpatient eating disorder nutritionist who recommended tube feedings. Cor track was placed on 6/11 and continue postpyloric tube feeding, tolerating same. Continue IV PPI and when necessary IV antiemetics. Discussed in detail with patient's outpatient RD Ms. Lynden Ang on 6/12 >she indicated that patient's recent eating disorder relapse has drastically affected her mental and physical health and strongly recommends ongoing inpatient care including tube feedings until patient is able to gradually tolerate adequate oral intake to sustain nutritional needs prior to considering discharge. Search ongoing for inpatient eating disorder facility placement. [facility] input appreciated> discussed with Dr. Carlean Purl, considering EGD 6/18 to r/o mechanical causes. Oral intake not progressing> suspect all psych. Today she indicated that her stomach feels better after tube feeding and thinks that she can eat better, encouraged to do so. 2. Dehydration: Secondary to GI losses and poor oral intake.Tube feeding. Dehydration resolved. 3. Acute kidney injury complicating stage III chronic kidney disease: Baseline creatinine not known but may be in the 1.5-1.6. Presented with creatinine of 2.2. Secondary to dehydration. Creatinine has plateaued at 1.6 which may be her baseline. Hyperkalemia resolved. Periodically follow BMP. 4. Non-anion gap metabolic acidosis: Secondary to GI losses. Resolved. 5. Blood and mucus in stools: Reviewed stool in bedside commode on 6/11 and no further blood noted. Continue to monitor. C. difficile testing negative. Abdominal x-ray without acute  findings. Started empirically on Flagyl on admission,? DC.  No further blood in stools. Diarrhea seems to be improving. C. difficile testing and GI pathogen panel PCR negative-discontinued Flagyl. Resolved. 6. Psychiatry (severe eating disorder/anxiety/depression/OCD/borderline personality disorder): Evaluated by psychiatry during previous admission. Documented reluctance in the past to take psychotropic medications despite professed desire to improve. No acute psychiatric issues at this time. Social worker consulted for placement to inpatient eating disorder facility at discharge. Psychiatry consultation appreciated, recommend continued current treatment. Patient now taking her medications. 7. Seizure disorder: Reviewed her PTA home medication list in detail with pharmacist and resume as many as meds via tube feed or one or 2 oral as tolerated. States that she had a remote seizure 1 due to "overdose of pain medications" that were given to her. Patient now taking her medications. 8. Essential hypertension: Toprol held due to soft blood pressures in the context of dehydration. Stable. 9. Congenital hypothyroidism: Followed by endocrinology at [facility]. Continue Synthroid. 10. Congenital deafness/cochlear implants 11. Hyperlipidemia: Resume statins when able to take. 12. Anemia: Likely hemoconcentration on admission. Stable. 13. Overactive bladder: 14. Borderline DM: Not on medications at home. Most recent A1c: 5.5.   DVT prophylaxis: DC Lovenox due to reported blood in stools on admission. SCDs. Code Status: Full Family Communication: None at bedside Disposition: DC when medically stable,? to inpatient eating disorder facility.   Consultants:  Psychiatry [facility]  Procedures:  Cor track placed 6/11  Antimicrobials:  Flagyl    Subjective: Occasional nausea. No vomiting or regurgitation reported. States that she has started to eat better and indicates that "since my stomach is feeling better  after tube feeds, I think I can eat better".  ROS: No suicidal or homicidal ideations or auditory or visual hallucinations reported.  Objective:  Vitals:   10/04/16 0500 10/04/16 1644 10/04/16 2107 10/05/16 1416  BP:  122/74 109/72   Pulse:  (!) 106 (!) 107   Resp:  18 18   Temp:  99.1 F (37.3 C) 98.9 F (37.2 C)   TempSrc:  Oral    SpO2:  99% 100%   Weight: 70.5 kg (155 lb 8 oz)   74 kg (163 lb 3.2 oz)  Height:        Examination:  General exam: Pleasant young female, moderately built and nourished, lying comfortably propped up in bed. Respiratory system: Clear to auscultation without increased work of breathing. Cardiovascular system: S1 and S2 heard, RRR. No JVD murmurs or pedal edema. Gastrointestinal system: Abdomen is nondistended, soft and nontender. Normal bowel sounds heard. Central nervous system: Alert and oriented 3. No focal neurological deficits. Hard of hearing. Extremities: Symmetric 5 x 5 power. Skin: No rashes, lesions or ulcers Psychiatry: Judgement and insight appear impaired. Mood & affect appears pleasant and appropriate.    Data Reviewed: I have personally reviewed following labs and imaging studies  CBC:  Recent Labs Lab 09/29/16 1142 10/01/16 1855  WBC 11.7* 7.5  NEUTROABS 7.5  --   HGB 15.6* 11.8*  HCT 45.4 35.7*  MCV 90.4 91.8  PLT 402* 937   Basic Metabolic Panel:  Recent Labs Lab 09/29/16 1142 09/30/16 0752 10/01/16 1855 10/02/16 0758 10/03/16 0436  NA 137 140 140 137 137  K 4.2 3.9 3.7 5.4* 3.9  CL 104 114* 113* 110 106  CO2 18* 16* 19* 21* 23  GLUCOSE 141* 107* 101* 105* 110*  BUN 29* 22* 14 12 14   CREATININE 2.20* 1.84* 1.61* 1.62* 1.67*  CALCIUM 11.1* 8.8* 9.1 9.0 9.0   CBG:  Recent Labs Lab  10/04/16 0623 10/04/16 1120 10/04/16 1718 10/04/16 2104 10/05/16 1209  GLUCAP 112* 179* 118* 136* 161*    Recent Results (from the past 240 hour(s))  Gastrointestinal Panel by PCR , Stool     Status: None    Collection Time: 09/29/16  2:30 PM  Result Value Ref Range Status   Campylobacter species NOT DETECTED NOT DETECTED Final   Plesimonas shigelloides NOT DETECTED NOT DETECTED Final   Salmonella species NOT DETECTED NOT DETECTED Final   Yersinia enterocolitica NOT DETECTED NOT DETECTED Final   Vibrio species NOT DETECTED NOT DETECTED Final   Vibrio cholerae NOT DETECTED NOT DETECTED Final   Enteroaggregative E coli (EAEC) NOT DETECTED NOT DETECTED Final   Enteropathogenic E coli (EPEC) NOT DETECTED NOT DETECTED Final   Enterotoxigenic E coli (ETEC) NOT DETECTED NOT DETECTED Final   Shiga like toxin producing E coli (STEC) NOT DETECTED NOT DETECTED Final   Shigella/Enteroinvasive E coli (EIEC) NOT DETECTED NOT DETECTED Final   Cryptosporidium NOT DETECTED NOT DETECTED Final   Cyclospora cayetanensis NOT DETECTED NOT DETECTED Final   Entamoeba histolytica NOT DETECTED NOT DETECTED Final   Giardia lamblia NOT DETECTED NOT DETECTED Final   Adenovirus F40/41 NOT DETECTED NOT DETECTED Final   Astrovirus NOT DETECTED NOT DETECTED Final   Norovirus GI/GII NOT DETECTED NOT DETECTED Final   Rotavirus A NOT DETECTED NOT DETECTED Final   Sapovirus (I, II, IV, and V) NOT DETECTED NOT DETECTED Final  C difficile quick scan w PCR reflex     Status: None   Collection Time: 09/29/16  2:30 PM  Result Value Ref Range Status   C Diff antigen NEGATIVE NEGATIVE Final   C Diff toxin NEGATIVE NEGATIVE Final   C Diff interpretation No C. difficile detected.  Final         Radiology Studies: No results found.      Scheduled Meds: . atorvastatin  20 mg Per Tube q1800  . docusate  100 mg Per Tube BID  . feeding supplement  1 Container Oral TID BM  . folic acid  1 mg Per Tube Daily  . haloperidol  5 mg Per Tube QHS  . lamoTRIgine  200 mg Per Tube Daily  . levothyroxine  150 mcg Per Tube QAC breakfast  . loratadine  10 mg Per Tube Daily  . lurasidone  20 mg Per Tube Q breakfast  . Melatonin  9 mg  Tube Q2000  . multivitamin  15 mL Per Tube Daily  . pantoprazole sodium  40 mg Per Tube BID  . QUEtiapine  150 mg Oral QHS  . thiamine  100 mg Per Tube Daily  . topiramate  50 mg Per Tube BID  . venlafaxine XR  150 mg Oral Q breakfast   Continuous Infusions: . feeding supplement (OSMOLITE 1.2 CAL) 1,000 mL (10/04/16 0544)  . ondansetron (ZOFRAN) IV Stopped (10/05/16 1200)     LOS: 6 days    Munachimso Palin, MD, FACP, FHM. [facility] Pager [phone]  If 7PM-7AM, please contact night-coverage [email or web address] Password [email or web address] 10/05/2016, 4:05 PM

## 2016-10-05 NOTE — Progress Notes (Signed)
While completing shift assessment; patient stated she doesn't want anyone waking her up in the middle of the night. Patient stated she was very upset with the tech woke up her up. Stated to the patient she has neuro checks, blood sugars, and vitals due throughout the night.  Patient refused.  Will continue to monitor the patient

## 2016-10-06 LAB — GLUCOSE, CAPILLARY
Glucose-Capillary: 145 mg/dL — ABNORMAL HIGH (ref 65–99)
Glucose-Capillary: 196 mg/dL — ABNORMAL HIGH (ref 65–99)
Glucose-Capillary: 209 mg/dL — ABNORMAL HIGH (ref 65–99)

## 2016-10-06 NOTE — Progress Notes (Signed)
PROGRESS NOTE    Brandi Love  HBZ:[id number] DOB: 01/21/1976 DOA: 09/29/2016 PCP: Vickii Penna, MD    Brief Narrative:  41 year old female who presented with a chief complaint of intractable nausea and vomiting. Patient is known to have hypothyroidism, dyslipidemia, major depressive disorder obsessive-compulsive disorder, eating disorder and laxative abuse. Presently admitted for pyelonephritis, discharged June 8, at home with intractable nausea and vomiting unable to keep liquids down, associated with mucous bloody stools.  On initial physical examination blood pressure 112/80, heart 102, temperature 98.5, aspirin 3 rate 17, oxygen saturations 96%. Glucose minutes were dry, her lungs are clear to auscultation bilaterally, heart is present rhythmic, abdomen soft with diffuse tenderness no guarding or rebound, no lower lower extremity edema. Sodium 137, potassium 4.2, chloride 104, bicarbonate 18, glucose 141, BUN 9, creatinine 2.20, white count 11.7, hemoglobin 15.6 hematocrit 45.4, platelets 402. Urinalysis negative for infection. Abdominal films negative for acute changes.  Patient admitted to the hospital working diagnosis acute kidney injury chronic kidney disease, due to dehydration related with nausea vomiting in the setting of eating disorder.  Assessment & Plan:   Principal Problem:   Acute kidney injury ([place]) Active Problems:   OCD (obsessive compulsive disorder)   Eating disorder   Deafness congenital   Intractable nausea and vomiting   Bloody diarrhea   AKI (acute kidney injury) ([place])   Severe dehydration   Severe protein-calorie malnutrition ([facility])   Congenital hypothyroidism   Hypertriglyceridemia   Anemia   Endometriosis   MDD (major depressive disorder)   Hyperlipidemia   CKD (chronic kidney disease), stage III   OAB (overactive bladder)   1. Intractable nausea and vomiting. Patient continue with full liquid diet, will continue with antiemetics, and tube  feedings per ng tube.  2. AKI on CKD stage 3. Stable renal function with cr at 1.67 with K at 3,9 and Na at 137, will continue to follow on renal panel in am, avoid hypotension or nephrotoxic medications.   3. Eating disorder. Will continue venlafaxine, seroquel. Melatonin, latuda. Haldol and hydroxyzine.   4. HTN. Continue blood pressure monitoring.   5. Hypothyroid. Continue levothyroxine   6. Seizures. Continue topiramate and lamotrigine.   DVT prophylaxis: scd Code Status: Full  Family Communication:  Disposition Plan: psych facility.    Consultants:     Procedures:     Antimicrobials:       Subjective: Patient feeling better, tolerating po well, improved nausea, no vomiting, no abdominal pain, no chest pain or dyspnea.   Objective: Vitals:   10/04/16 2107 10/05/16 1416 10/05/16 2218 10/06/16 0448  BP: 109/72  110/66 96/69  Pulse: (!) 107  (!) 117 (!) 114  Resp: 18  18 18   Temp: 98.9 F (37.2 C)  99 F (37.2 C) 98.8 F (37.1 C)  TempSrc:      SpO2: 100%  99% 99%  Weight:  74 kg (163 lb 3.2 oz)    Height:        Intake/Output Summary (Last 24 hours) at 10/06/16 1325 Last data filed at 10/06/16 0600  Gross per 24 hour  Intake              150 ml  Output                0 ml  Net              150 ml   Filed Weights   10/03/16 0829 10/04/16 0500 10/05/16 1416  Weight: 74.1 kg (  163 lb 4.8 oz) 70.5 kg (155 lb 8 oz) 74 kg (163 lb 3.2 oz)    Examination:  General exam: not in pain or dyspnea E ENT. No pallor or icterus Respiratory system: Clear to auscultation. Respiratory effort normal. No wheezing, rales or rhonchi Cardiovascular system: S1 & S2 heard, RRR. No JVD, murmurs, rubs, gallops or clicks. No pedal edema. Gastrointestinal system: Abdomen is nondistended, soft and nontender. No organomegaly or masses felt. Normal bowel sounds heard. Central nervous system: Alert and oriented. No focal neurological deficits. Extremities: Symmetric 5 x 5  power. Skin: No rashes, lesions or ulcers     Data Reviewed: I have personally reviewed following labs and imaging studies  CBC:  Recent Labs Lab 10/01/16 1855  WBC 7.5  HGB 11.8*  HCT 35.7*  MCV 91.8  PLT 423   Basic Metabolic Panel:  Recent Labs Lab 09/30/16 0752 10/01/16 1855 10/02/16 0758 10/03/16 0436  NA 140 140 137 137  K 3.9 3.7 5.4* 3.9  CL 114* 113* 110 106  CO2 16* 19* 21* 23  GLUCOSE 107* 101* 105* 110*  BUN 22* 14 12 14   CREATININE 1.84* 1.61* 1.62* 1.67*  CALCIUM 8.8* 9.1 9.0 9.0   GFR: Estimated Creatinine Clearance: 46.1 mL/min (A) (by C-G formula based on SCr of 1.67 mg/dL (H)). Liver Function Tests: No results for input(s): AST, ALT, ALKPHOS, BILITOT, PROT, ALBUMIN in the last 168 hours. No results for input(s): LIPASE, AMYLASE in the last 168 hours. No results for input(s): AMMONIA in the last 168 hours. Coagulation Profile: No results for input(s): INR, PROTIME in the last 168 hours. Cardiac Enzymes: No results for input(s): CKTOTAL, CKMB, CKMBINDEX, TROPONINI in the last 168 hours. BNP (last 3 results) No results for input(s): PROBNP in the last 8760 hours. HbA1C: No results for input(s): HGBA1C in the last 72 hours. CBG:  Recent Labs Lab 10/04/16 2104 10/05/16 1209 10/05/16 1700 10/05/16 1956 10/06/16 0446  GLUCAP 136* 161* 119* 166* 145*   Lipid Profile: No results for input(s): CHOL, HDL, LDLCALC, TRIG, CHOLHDL, LDLDIRECT in the last 72 hours. Thyroid Function Tests: No results for input(s): TSH, T4TOTAL, FREET4, T3FREE, THYROIDAB in the last 72 hours. Anemia Panel: No results for input(s): VITAMINB12, FOLATE, FERRITIN, TIBC, IRON, RETICCTPCT in the last 72 hours. Sepsis Labs: No results for input(s): PROCALCITON, LATICACIDVEN in the last 168 hours.  Recent Results (from the past 240 hour(s))  Gastrointestinal Panel by PCR , Stool     Status: None   Collection Time: 09/29/16  2:30 PM  Result Value Ref Range Status    Campylobacter species NOT DETECTED NOT DETECTED Final   Plesimonas shigelloides NOT DETECTED NOT DETECTED Final   Salmonella species NOT DETECTED NOT DETECTED Final   Yersinia enterocolitica NOT DETECTED NOT DETECTED Final   Vibrio species NOT DETECTED NOT DETECTED Final   Vibrio cholerae NOT DETECTED NOT DETECTED Final   Enteroaggregative E coli (EAEC) NOT DETECTED NOT DETECTED Final   Enteropathogenic E coli (EPEC) NOT DETECTED NOT DETECTED Final   Enterotoxigenic E coli (ETEC) NOT DETECTED NOT DETECTED Final   Shiga like toxin producing E coli (STEC) NOT DETECTED NOT DETECTED Final   Shigella/Enteroinvasive E coli (EIEC) NOT DETECTED NOT DETECTED Final   Cryptosporidium NOT DETECTED NOT DETECTED Final   Cyclospora cayetanensis NOT DETECTED NOT DETECTED Final   Entamoeba histolytica NOT DETECTED NOT DETECTED Final   Giardia lamblia NOT DETECTED NOT DETECTED Final   Adenovirus F40/41 NOT DETECTED NOT DETECTED Final  Astrovirus NOT DETECTED NOT DETECTED Final   Norovirus GI/GII NOT DETECTED NOT DETECTED Final   Rotavirus A NOT DETECTED NOT DETECTED Final   Sapovirus (I, II, IV, and V) NOT DETECTED NOT DETECTED Final  C difficile quick scan w PCR reflex     Status: None   Collection Time: 09/29/16  2:30 PM  Result Value Ref Range Status   C Diff antigen NEGATIVE NEGATIVE Final   C Diff toxin NEGATIVE NEGATIVE Final   C Diff interpretation No C. difficile detected.  Final         Radiology Studies: No results found.      Scheduled Meds: . atorvastatin  20 mg Per Tube q1800  . docusate  100 mg Per Tube BID  . feeding supplement  1 Container Oral TID BM  . folic acid  1 mg Per Tube Daily  . haloperidol  5 mg Per Tube QHS  . lamoTRIgine  200 mg Per Tube Daily  . levothyroxine  150 mcg Per Tube QAC breakfast  . loratadine  10 mg Per Tube Daily  . lurasidone  20 mg Per Tube Q breakfast  . Melatonin  9 mg Tube Q2000  . multivitamin  15 mL Per Tube Daily  . pantoprazole  sodium  40 mg Per Tube BID  . QUEtiapine  150 mg Oral QHS  . thiamine  100 mg Per Tube Daily  . topiramate  50 mg Per Tube BID  . venlafaxine XR  150 mg Oral Q breakfast   Continuous Infusions: . feeding supplement (OSMOLITE 1.2 CAL) 1,000 mL (10/06/16 0458)  . ondansetron (ZOFRAN) IV Stopped (10/06/16 1022)     LOS: 7 days        Mauricio Gerome Apley, MD [facility] Pager [phone]  If 7PM-7AM, please contact night-coverage [email or web address] Password [email or web address] 10/06/2016, 1:25 PM

## 2016-10-07 ENCOUNTER — Encounter (HOSPITAL_COMMUNITY)
Admission: EM | Disposition: A | Discharge status: Home / Self Care | Payer: Self-pay | Source: Home / Self Care | Attending: Internal Medicine

## 2016-10-07 ENCOUNTER — Inpatient Hospital Stay (HOSPITAL_COMMUNITY): Payer: Medicare Other | Admitting: Anesthesiology

## 2016-10-07 ENCOUNTER — Inpatient Hospital Stay (HOSPITAL_COMMUNITY): Payer: Medicare Other

## 2016-10-07 ENCOUNTER — Encounter (HOSPITAL_COMMUNITY): Payer: Self-pay | Admitting: *Deleted

## 2016-10-07 HISTORY — PX: ESOPHAGOGASTRODUODENOSCOPY: SHX5428

## 2016-10-07 LAB — BASIC METABOLIC PANEL
ANION GAP: 10 (ref 5–15)
BUN: 23 mg/dL — ABNORMAL HIGH (ref 6–20)
CALCIUM: 9 mg/dL (ref 8.9–10.3)
CO2: 24 mmol/L (ref 22–32)
CREATININE: 1.63 mg/dL — AB (ref 0.44–1.00)
Chloride: 101 mmol/L (ref 101–111)
GFR, EST AFRICAN AMERICAN: 45 mL/min — AB (ref >60)
GFR, EST NON AFRICAN AMERICAN: 39 mL/min — AB (ref >60)
Glucose, Bld: 112 mg/dL — ABNORMAL HIGH (ref 65–99)
Potassium: 4 mmol/L (ref 3.5–5.1)
Sodium: 135 mmol/L (ref 135–145)

## 2016-10-07 LAB — GLUCOSE, CAPILLARY
GLUCOSE-CAPILLARY: 93 mg/dL (ref 65–99)
GLUCOSE-CAPILLARY: 144 mg/dL — AB (ref 65–99)
Glucose-Capillary: 164 mg/dL — ABNORMAL HIGH (ref 65–99)
Glucose-Capillary: 85 mg/dL (ref 65–99)
Glucose-Capillary: 156 mg/dL — ABNORMAL HIGH (ref 65–99)

## 2016-10-07 SURGERY — EGD (ESOPHAGOGASTRODUODENOSCOPY)
Anesthesia: Monitor Anesthesia Care

## 2016-10-07 MED ORDER — BUTAMBEN-TETRACAINE-BENZOCAINE 2-2-14 % EX AERO
INHALATION_SPRAY | CUTANEOUS | Status: DC | PRN
  Administered 2016-10-07: 2 via TOPICAL

## 2016-10-07 MED ORDER — ONDANSETRON HCL 4 MG/2ML IJ SOLN
INTRAMUSCULAR | Status: DC | PRN
  Administered 2016-10-07: 4 mg via INTRAVENOUS

## 2016-10-07 MED ORDER — SODIUM CHLORIDE 0.9 % IV SOLN
INTRAVENOUS | Status: DC
  Administered 2016-10-07: 500 mL via INTRAVENOUS
  Administered 2016-10-07: 11:00:00 via INTRAVENOUS

## 2016-10-07 MED ORDER — PROPOFOL 500 MG/50ML IV EMUL
INTRAVENOUS | Status: DC | PRN
  Administered 2016-10-07: 100 ug/kg/min via INTRAVENOUS

## 2016-10-07 MED ORDER — PROPOFOL 10 MG/ML IV BOLUS
INTRAVENOUS | Status: DC | PRN
  Administered 2016-10-07: 20 mg via INTRAVENOUS
  Administered 2016-10-07: 10 mg via INTRAVENOUS

## 2016-10-07 NOTE — H&P (View-Only) (Signed)
Daily Rounding Note  10/07/2016, 8:40 AM  LOS: 8 days   SUBJECTIVE:   Chief complaint: N/V abd pain.  All are better, not resolved, with scheduled Zofran and prn Phenergan.  Trying clears such as itialian ices, this seems to slightly increase nausea.  Stools loose since TF initiated.  Sleeping well   OBJECTIVE:         Vital signs in last 24 hours:    Temp:  [98.7 F (37.1 C)-99 F (37.2 C)] 98.7 F (37.1 C) (06/18 0510) Pulse Rate:  [113-120] 113 (06/18 0510) Resp:  [18] 18 (06/18 0510) BP: (102-111)/(70-75) 111/75 (06/18 0510) SpO2:  [98 %-100 %] 98 % (06/18 0510) Weight:  [77.9 kg (171 lb 11.2 oz)] 77.9 kg (171 lb 11.2 oz) (06/18 0724) Last BM Date: 10/05/16 Filed Weights   10/04/16 0500 10/05/16 1416 10/07/16 0724  Weight: 70.5 kg (155 lb 8 oz) 74 kg (163 lb 3.2 oz) 77.9 kg (171 lb 11.2 oz)   General: comfortable, not ill looking  ENT: Cortrac feeding tube in left nares, blue tubing "anchor" in right nares.   Heart: reg, slightly tachy.   Chest: clear bil.  Unlabored breathing and no cough Abdomen: soft, BS active.  Diffusely tender to slight pressure.  No guard or rebound.    Extremities: no CCE Derm: multiple healed horizontal "cutting" scars on left wrist.   Neuro/Psych:  Cochlear implant placed by pt into her right ear. Oriented x 3.  No tremor.    Intake/Output from previous day: No intake/output data recorded.  Intake/Output this shift: No intake/output data recorded.  Lab Results: No results for input(s): WBC, HGB, HCT, PLT in the last 72 hours. BMET  Recent Labs  10/07/16 0559  NA 135  K 4.0  CL 101  CO2 24  GLUCOSE 112*  BUN 23*  CREATININE 1.63*  CALCIUM 9.0    Studies/Results: No results found.   Scheduled Meds: . atorvastatin  20 mg Per Tube q1800  . docusate  100 mg Per Tube BID  . feeding supplement  1 Container Oral TID BM  . folic acid  1 mg Per Tube Daily  . haloperidol   5 mg Per Tube QHS  . lamoTRIgine  200 mg Per Tube Daily  . levothyroxine  150 mcg Per Tube QAC breakfast  . loratadine  10 mg Per Tube Daily  . lurasidone  20 mg Per Tube Q breakfast  . Melatonin  9 mg Tube Q2000  . multivitamin  15 mL Per Tube Daily  . pantoprazole sodium  40 mg Per Tube BID  . QUEtiapine  150 mg Oral QHS  . thiamine  100 mg Per Tube Daily  . topiramate  50 mg Per Tube BID  . venlafaxine XR  150 mg Oral Q breakfast   Continuous Infusions: . feeding supplement (OSMOLITE 1.2 CAL) Stopped (10/06/16 2319)  . ondansetron (ZOFRAN) IV Stopped (10/07/16 0522)   PRN Meds:.acetaminophen **OR** acetaminophen, diphenhydrAMINE, hydrOXYzine, morphine injection, phenol, promethazine   ASSESMENT:   *  Acute on chronic N/V/abd pain in pt with eating disorder and deep psychiatric issues.  sxs improved but not resolved.  Pt is interested in finding out if something is causing this other than functional/psych issue.  Reluctance is need to remove and then replace Cortrak nasal feeding tube if this done when she is not sedated.   PLAN   *  EGD today.  If Cortrak dislodged, the dietary team  that places them should be able to come to endo to replace it.  Today that dietician is Myriam Jacobson at [phone].  I discussed situation with her.     Azucena Freed  10/07/2016, 8:40 AM Pager: [phone]    Attending physician's note   I have taken an interval history, reviewed the chart and examined the patient. I agree with the Advanced Practitioner's note, impression and recommendations. EGD today.   Lucio Edward, MD [facility] [phone] Mon-Fri 8a-5p [phone] after 5p, weekends, holidays

## 2016-10-07 NOTE — Progress Notes (Signed)
PROGRESS NOTE   Brandi Love  OAC:[id number]    DOB: 1975-12-05    DOA: 09/29/2016  PCP: Vickii Penna, MD   I have briefly reviewed patients previous medical records in [facility].  Brief Narrative:  41 year old female with PMH of anemia, anxiety and depression, chronic kidney disease, borderline DM, severe eating disorder/bulimia/laxative abuse history, OCD, GERD, HTN, HLD, congenital hypothyroid, hearing loss/cochlear implant, remote PE, seizure, recent hospitalization 09/18/16-09/27/16 for sepsis due to pyelonephritis, hypotension, acute psychosis and evaluated by psychiatry, needed short-term IVC, stabilized and discharged home, return to ED on 09/29/16 reporting that she had not eaten anything since that discharge (stated that she had not eaten for 27 days on admission), intractable nausea and vomiting, inability to keep anything by mouth, diffuse abdominal cramping and blood or mucus in stools. She had similar episode in 2017 when she underwent colonoscopy that revealed small internal hemorrhoids and a small anal fissure but no significant abnormalities. Admitted for intractable nausea, vomiting, associated dehydration, acute kidney injury and metabolic acidosis. Case discussed with her outpatient eating disorder dietitian and recommended tube feedings. Cor track placed and tube feeding initiated 6/11. Status post EGD 6/18, normal. Advance diet as tolerated.  Assessment & Plan:   Principal Problem:   Acute kidney injury ([place]) Active Problems:   OCD (obsessive compulsive disorder)   Eating disorder   Deafness congenital   Intractable nausea and vomiting   Bloody diarrhea   AKI (acute kidney injury) ([place])   Severe dehydration   Severe protein-calorie malnutrition (HCC)   Congenital hypothyroidism   Hypertriglyceridemia   Anemia   Endometriosis   MDD (major depressive disorder)   Hyperlipidemia   CKD (chronic kidney disease), stage III   OAB (overactive  bladder)   1. Intractable nausea and vomiting: In the setting of severe eating disorder, laxative abuse and apparent bulimia. As per patient's report, has been unable to tolerate any oral intake for 27 days on admission. Admitting team discussed with her outpatient eating disorder nutritionist who recommended tube feedings. Cor track was placed on 6/11 and continue postpyloric tube feeding, tolerating same. Continue IV PPI and when necessary IV antiemetics. Discussed in detail with patient's outpatient RD Ms. Lynden Ang on 6/12 >she indicated that patient's recent eating disorder relapse has drastically affected her mental and physical health and strongly recommends ongoing inpatient care including tube feedings until patient is able to gradually tolerate adequate oral intake to sustain nutritional needs prior to considering discharge. Search ongoing for inpatient eating disorder facility placement.  GI was consulted. EGD 6/18 normal. GI signed off. Resume tube feeds and advance oral diet as tolerated with aim to discontinue tube feeds when she is able to tolerate adequate oral intake. 2. Dehydration: Secondary to GI losses and poor oral intake.Tube feeding. Dehydration resolved. 3. Acute kidney injury complicating stage III chronic kidney disease: Baseline creatinine not known but may be in the 1.5-1.6. Presented with creatinine of 2.2. Secondary to dehydration. Creatinine has plateaued at 1.6 which may be her baseline. Hyperkalemia resolved. Periodically follow BMP. 4. Non-anion gap metabolic acidosis: Secondary to GI losses. Resolved. 5. Blood and mucus in stools: Reviewed stool in bedside commode on 6/11 and no further blood noted. Continue to monitor. C. difficile testing negative. Abdominal x-ray without acute findings. Started empirically on Flagyl on admission,? DC. No further blood in stools. Diarrhea seems to be improving. C. difficile testing and GI pathogen panel PCR negative-discontinued  Flagyl. Resolved. 6. Psychiatry (severe eating disorder/anxiety/depression/OCD/borderline personality disorder): Evaluated  by psychiatry during previous admission. Documented reluctance in the past to take psychotropic medications despite professed desire to improve. No acute psychiatric issues at this time. Social worker consulted for placement to inpatient eating disorder facility at discharge. Psychiatry consultation appreciated, recommend continued current treatment. Patient now taking her medications. 7. Seizure disorder: Reviewed her PTA home medication list in detail with pharmacist and resume as many as meds via tube feed or one or 2 oral as tolerated. States that she had a remote seizure 1 due to "overdose of pain medications" that were given to her. Patient now taking her medications. 8. Essential hypertension: Toprol held due to soft blood pressures in the context of dehydration. Stable. 9. Congenital hypothyroidism: Followed by endocrinology at [facility]. Continue Synthroid. 10. Congenital deafness/cochlear implants 11. Hyperlipidemia: Resume statins when able to take. 12. Anemia: Likely hemoconcentration on admission. Stable. 13. Overactive bladder: 14. Borderline DM: Not on medications at home. Most recent A1c: 5.5.   DVT prophylaxis: DC Lovenox due to reported blood in stools on admission. SCDs. Code Status: Full Family Communication: None at bedside Disposition: DC when medically stable,? to inpatient eating disorder facility.   Consultants:  Psychiatry [facility]  Procedures:  Cor track placed 6/11 EGD 6/18  Antimicrobials:  Flagyl -discontinued   Subjective: Seen this afternoon after EGD. Sleeping and has her hearing aids out. Did not attempt to wake up.  ROS: Unable  Objective:  Vitals:   10/07/16 1130 10/07/16 1135 10/07/16 1140 10/07/16 1431  BP:  117/81  105/62  Pulse: (!) 107 97 99 (!) 114  Resp: 15 13 10 15   Temp:    99 F (37.2 C)  TempSrc:    Oral  SpO2:  97% 98% 98% 93%  Weight:      Height:        Examination:  General exam: Pleasant young female, moderately built and nourished, lying comfortably propped up in bed. Cor track in place.  Did not perform rest of exam because didn't want to wake her up at this time.     Data Reviewed: I have personally reviewed following labs and imaging studies  CBC:  Recent Labs Lab 10/01/16 1855  WBC 7.5  HGB 11.8*  HCT 35.7*  MCV 91.8  PLT 280   Basic Metabolic Panel:  Recent Labs Lab 10/01/16 1855 10/02/16 0758 10/03/16 0436 10/07/16 0559  NA 140 137 137 135  K 3.7 5.4* 3.9 4.0  CL 113* 110 106 101  CO2 19* 21* 23 24  GLUCOSE 101* 105* 110* 112*  BUN 14 12 14  23*  CREATININE 1.61* 1.62* 1.67* 1.63*  CALCIUM 9.1 9.0 9.0 9.0   CBG:  Recent Labs Lab 10/06/16 2010 10/07/16 0508 10/07/16 0920 10/07/16 1202 10/07/16 1643  GLUCAP 209* 164* 93 85 144*    Recent Results (from the past 240 hour(s))  Gastrointestinal Panel by PCR , Stool     Status: None   Collection Time: 09/29/16  2:30 PM  Result Value Ref Range Status   Campylobacter species NOT DETECTED NOT DETECTED Final   Plesimonas shigelloides NOT DETECTED NOT DETECTED Final   Salmonella species NOT DETECTED NOT DETECTED Final   Yersinia enterocolitica NOT DETECTED NOT DETECTED Final   Vibrio species NOT DETECTED NOT DETECTED Final   Vibrio cholerae NOT DETECTED NOT DETECTED Final   Enteroaggregative E coli (EAEC) NOT DETECTED NOT DETECTED Final   Enteropathogenic E coli (EPEC) NOT DETECTED NOT DETECTED Final   Enterotoxigenic E coli (ETEC) NOT DETECTED NOT DETECTED  Final   Shiga like toxin producing E coli (STEC) NOT DETECTED NOT DETECTED Final   Shigella/Enteroinvasive E coli (EIEC) NOT DETECTED NOT DETECTED Final   Cryptosporidium NOT DETECTED NOT DETECTED Final   Cyclospora cayetanensis NOT DETECTED NOT DETECTED Final   Entamoeba histolytica NOT DETECTED NOT DETECTED Final   Giardia lamblia NOT DETECTED NOT  DETECTED Final   Adenovirus F40/41 NOT DETECTED NOT DETECTED Final   Astrovirus NOT DETECTED NOT DETECTED Final   Norovirus GI/GII NOT DETECTED NOT DETECTED Final   Rotavirus A NOT DETECTED NOT DETECTED Final   Sapovirus (I, II, IV, and V) NOT DETECTED NOT DETECTED Final  C difficile quick scan w PCR reflex     Status: None   Collection Time: 09/29/16  2:30 PM  Result Value Ref Range Status   C Diff antigen NEGATIVE NEGATIVE Final   C Diff toxin NEGATIVE NEGATIVE Final   C Diff interpretation No C. difficile detected.  Final         Radiology Studies: Dg Abd Portable 1v  Result Date: 10/07/2016 CLINICAL DATA:  Enteric tube placement. EXAM: PORTABLE ABDOMEN - 1 VIEW COMPARISON:  09/30/2016 FINDINGS: The feeding tube terminates over the midline of the central abdomen, more proximal in location than on the prior study and likely in the proximal third portion of the duodenum. Gas is present in loops of grossly nondilated bowel in the central to lower abdomen, incompletely imaged though without evidence of bowel obstruction. Mild bibasilar lung opacities likely reflect atelectasis. No acute osseous abnormality is seen. IMPRESSION: Interval retraction of feeding tube into the proximal third portion of the duodenum. Electronically Signed   By: Logan Bores M.D.   On: 10/07/2016 15:04        Scheduled Meds: . atorvastatin  20 mg Per Tube q1800  . docusate  100 mg Per Tube BID  . feeding supplement  1 Container Oral TID BM  . folic acid  1 mg Per Tube Daily  . haloperidol  5 mg Per Tube QHS  . lamoTRIgine  200 mg Per Tube Daily  . levothyroxine  150 mcg Per Tube QAC breakfast  . loratadine  10 mg Per Tube Daily  . lurasidone  20 mg Per Tube Q breakfast  . Melatonin  9 mg Tube Q2000  . multivitamin  15 mL Per Tube Daily  . pantoprazole sodium  40 mg Per Tube BID  . QUEtiapine  150 mg Oral QHS  . thiamine  100 mg Per Tube Daily  . topiramate  50 mg Per Tube BID  . venlafaxine XR  150  mg Oral Q breakfast   Continuous Infusions: . feeding supplement (OSMOLITE 1.2 CAL) 1,000 mL (10/07/16 1617)  . ondansetron (ZOFRAN) IV Stopped (10/07/16 1632)     LOS: 8 days    Marylyn Appenzeller, MD, FACP, FHM. [facility] Pager [phone]  If 7PM-7AM, please contact night-coverage [email or web address] Password [facility] 10/07/2016, 8:27 PM

## 2016-10-07 NOTE — Interval H&P Note (Signed)
History and Physical Interval Note:  10/07/2016 10:43 AM  Brandi Love  has presented today for surgery, with the diagnosis of abd pain, nausea, vomiting  The various methods of treatment have been discussed with the patient and family. After consideration of risks, benefits and other options for treatment, the patient has consented to  Procedure(s): ESOPHAGOGASTRODUODENOSCOPY (EGD) (N/A) as a surgical intervention .  The patient's history has been reviewed, patient examined, no change in status, stable for surgery.  I have reviewed the patient's chart and labs.  Questions were answered to the patient's satisfaction.     Pricilla Riffle. Fuller Plan

## 2016-10-07 NOTE — Progress Notes (Signed)
Daily Rounding Note  10/07/2016, 8:40 AM  LOS: 8 days   SUBJECTIVE:   Chief complaint: N/V abd pain.  All are better, not resolved, with scheduled Zofran and prn Phenergan.  Trying clears such as itialian ices, this seems to slightly increase nausea.  Stools loose since TF initiated.  Sleeping well   OBJECTIVE:         Vital signs in last 24 hours:    Temp:  [98.7 F (37.1 C)-99 F (37.2 C)] 98.7 F (37.1 C) (06/18 0510) Pulse Rate:  [113-120] 113 (06/18 0510) Resp:  [18] 18 (06/18 0510) BP: (102-111)/(70-75) 111/75 (06/18 0510) SpO2:  [98 %-100 %] 98 % (06/18 0510) Weight:  [77.9 kg (171 lb 11.2 oz)] 77.9 kg (171 lb 11.2 oz) (06/18 0724) Last BM Date: 10/05/16 Filed Weights   10/04/16 0500 10/05/16 1416 10/07/16 0724  Weight: 70.5 kg (155 lb 8 oz) 74 kg (163 lb 3.2 oz) 77.9 kg (171 lb 11.2 oz)   General: comfortable, not ill looking  ENT: Cortrac feeding tube in left nares, blue tubing "anchor" in right nares.   Heart: reg, slightly tachy.   Chest: clear bil.  Unlabored breathing and no cough Abdomen: soft, BS active.  Diffusely tender to slight pressure.  No guard or rebound.    Extremities: no CCE Derm: multiple healed horizontal "cutting" scars on left wrist.   Neuro/Psych:  Cochlear implant placed by pt into her right ear. Oriented x 3.  No tremor.    Intake/Output from previous day: No intake/output data recorded.  Intake/Output this shift: No intake/output data recorded.  Lab Results: No results for input(s): WBC, HGB, HCT, PLT in the last 72 hours. BMET  Recent Labs  10/07/16 0559  NA 135  K 4.0  CL 101  CO2 24  GLUCOSE 112*  BUN 23*  CREATININE 1.63*  CALCIUM 9.0    Studies/Results: No results found.   Scheduled Meds: . atorvastatin  20 mg Per Tube q1800  . docusate  100 mg Per Tube BID  . feeding supplement  1 Container Oral TID BM  . folic acid  1 mg Per Tube Daily  . haloperidol   5 mg Per Tube QHS  . lamoTRIgine  200 mg Per Tube Daily  . levothyroxine  150 mcg Per Tube QAC breakfast  . loratadine  10 mg Per Tube Daily  . lurasidone  20 mg Per Tube Q breakfast  . Melatonin  9 mg Tube Q2000  . multivitamin  15 mL Per Tube Daily  . pantoprazole sodium  40 mg Per Tube BID  . QUEtiapine  150 mg Oral QHS  . thiamine  100 mg Per Tube Daily  . topiramate  50 mg Per Tube BID  . venlafaxine XR  150 mg Oral Q breakfast   Continuous Infusions: . feeding supplement (OSMOLITE 1.2 CAL) Stopped (10/06/16 2319)  . ondansetron (ZOFRAN) IV Stopped (10/07/16 0522)   PRN Meds:.acetaminophen **OR** acetaminophen, diphenhydrAMINE, hydrOXYzine, morphine injection, phenol, promethazine   ASSESMENT:   *  Acute on chronic N/V/abd pain in pt with eating disorder and deep psychiatric issues.  sxs improved but not resolved.  Pt is interested in finding out if something is causing this other than functional/psych issue.  Reluctance is need to remove and then replace Cortrak nasal feeding tube if this done when she is not sedated.   PLAN   *  EGD today.  If Cortrak dislodged, the dietary team  that places them should be able to come to endo to replace it.  Today that dietician is Myriam Jacobson at [phone].  I discussed situation with her.     Azucena Freed  10/07/2016, 8:40 AM Pager: [phone]    Attending physician's note   I have taken an interval history, reviewed the chart and examined the patient. I agree with the Advanced Practitioner's note, impression and recommendations. EGD today.   Lucio Edward, MD [facility] [phone] Mon-Fri 8a-5p [phone] after 5p, weekends, holidays

## 2016-10-07 NOTE — Transfer of Care (Signed)
Immediate Anesthesia Transfer of Care Note  Patient: Brandi Love  Procedure(s) Performed: Procedure(s): ESOPHAGOGASTRODUODENOSCOPY (EGD) (N/A)  Patient Location: Endoscopy Unit  Anesthesia Type:MAC  Level of Consciousness: awake, alert  and oriented  Airway & Oxygen Therapy: Patient Spontanous Breathing and Patient connected to nasal cannula oxygen  Post-op Assessment: Report given to RN and Post -op Vital signs reviewed and stable  Post vital signs: Reviewed and stable  Last Vitals:  Vitals:   10/07/16 0510 10/07/16 0941  BP: 111/75 120/79  Pulse: (!) 113   Resp: 18 15  Temp: 37.1 C 36.8 C    Last Pain:  Vitals:   10/07/16 0941  TempSrc: Oral  PainSc: 5       Patients Stated Pain Goal: 5 (91/79/15 0569)  Complications: No apparent anesthesia complications

## 2016-10-07 NOTE — Progress Notes (Signed)
CSW spoke with [facility] regarding patient referral- they are still following and working on single case agreement  Patient still with NG tube- [employer] will be unable to accept patient till she is stable without NG tube  CSW will continue to follow  Jorge Ny, [place] Social Worker [phone]

## 2016-10-07 NOTE — Anesthesia Postprocedure Evaluation (Signed)
Anesthesia Post Note  Patient: Brandi Love  Procedure(s) Performed: Procedure(s) (LRB): ESOPHAGOGASTRODUODENOSCOPY (EGD) (N/A)     Patient location during evaluation: PACU Anesthesia Type: MAC Level of consciousness: awake and alert Pain management: pain level controlled Vital Signs Assessment: post-procedure vital signs reviewed and stable Respiratory status: spontaneous breathing, nonlabored ventilation and respiratory function stable Cardiovascular status: stable and blood pressure returned to baseline Anesthetic complications: no    Last Vitals:  Vitals:   10/07/16 0941 10/07/16 1115  BP: 120/79   Pulse:  (!) 107  Resp: 15 16  Temp: 36.8 C     Last Pain:  Vitals:   10/07/16 0941  TempSrc: Oral  PainSc: [place]

## 2016-10-07 NOTE — Progress Notes (Signed)
Patient refuses 0000 CBG. Patient was educated on importance of monitoring CBG but still refuses at this time.  Will continue to monitor and notify MD as needed.

## 2016-10-07 NOTE — Anesthesia Preprocedure Evaluation (Addendum)
Anesthesia Evaluation  Patient identified by MRN, date of birth, ID band Patient awake    Reviewed: Allergy & Precautions, H&P , Patient's Chart, lab work & pertinent test results, reviewed documented beta blocker date and time   Airway Mallampati: II  TM Distance: >3 FB Neck ROM: full    Dental no notable dental hx.    Pulmonary    Pulmonary exam normal breath sounds clear to auscultation       Cardiovascular hypertension,  Rhythm:regular Rate:Normal     Neuro/Psych    GI/Hepatic   Endo/Other  diabetes  Renal/GU      Musculoskeletal   Abdominal   Peds  Hematology   Anesthesia Other Findings   Reproductive/Obstetrics                             Anesthesia Physical Anesthesia Plan  ASA: II  Anesthesia Plan: MAC   Post-op Pain Management:    Induction: Intravenous  PONV Risk Score and Plan:   Airway Management Planned: Mask and Natural Airway  Additional Equipment:   Intra-op Plan:   Post-operative Plan:   Informed Consent: I have reviewed the patients History and Physical, chart, labs and discussed the procedure including the risks, benefits and alternatives for the proposed anesthesia with the patient or authorized representative who has indicated his/her understanding and acceptance.   Dental Advisory Given  Plan Discussed with: CRNA and Surgeon  Anesthesia Plan Comments:         Anesthesia Quick Evaluation  

## 2016-10-07 NOTE — Progress Notes (Signed)
Patient states that she does not want to be bothered for the rest of the night. She states that after her pain medication, which has been given, she would like to go to sleep and not be bothered. She asked that she is not woken for blood sugars or vitals. Pt educated. Will continue to monitor

## 2016-10-07 NOTE — Op Note (Signed)
[facility] Patient Name: Brandi Love Procedure Date : 10/07/2016 MRN: [id number] Attending MD: Ladene Artist , MD Date of Birth: May 15, 1975 CSN: [id number] Age: 41 Admit Type: Inpatient Procedure:                Upper GI endoscopy Indications:              Epigastric abdominal pain, Nausea with vomiting Providers:                Pricilla Riffle. Fuller Plan, MD, Cleda Daub, RN, Alan Mulder, Technician Referring MD:             [facility] Medicines:                Monitored Anesthesia Care Complications:            No immediate complications. Estimated Blood Loss:     Estimated blood loss: none. Procedure:                Pre-Anesthesia Assessment:                           - Prior to the procedure, a History and Physical                            was performed, and patient medications and                            allergies were reviewed. The patient's tolerance of                            previous anesthesia was also reviewed. The risks                            and benefits of the procedure and the sedation                            options and risks were discussed with the patient.                            All questions were answered, and informed consent                            was obtained. Prior Anticoagulants: The patient has                            taken no previous anticoagulant or antiplatelet                            agents. ASA Grade Assessment: III - A patient with                            severe systemic disease. After reviewing the risks  and benefits, the patient was deemed in                            satisfactory condition to undergo the procedure.                           After obtaining informed consent, the endoscope was                            passed under direct vision. Throughout the                            procedure, the patient's blood pressure, pulse, and                       oxygen saturations were monitored continuously. The                            was introduced through the mouth, and advanced to                            the second part of duodenum. The upper GI endoscopy                            was accomplished without difficulty. The patient                            tolerated the procedure well. Scope In: Scope Out: Findings:      The esophagus was normal.      The stomach was normal.      The examined duodenum was normal.      The cardia and gastric fundus were normal on retroflexion.      - Enteric feeding tube in place and in good position with the tip in       duodenum however the tip moved to the stomach during the procedure. Impression:               - Normal esophagus.                           - Normal stomach.                           - Normal examined duodenum.                           - No specimens collected.                           - Enteral feeding tube in place. Moderate Sedation:      none/MAC Recommendation:           - Patient has a contact number available for                            emergencies. The signs and symptoms of potential  delayed complications were discussed with the                            patient. Return to normal activities tomorrow.                            Written discharge instructions were provided to the                            patient.                           - Resume previous feeding.                           - Continue present medications including scheduled                            Zofran TID dosing and Phenergan prn.                           - Return patient to hospital ward for ongoing care.                           - GI follow up as needed with Dr. Carlean Purl.                           - No GI cause for symptoms was found. No further GI                            evaluation needed at this time. GI signing off. Procedure Code(s):         --- Professional ---                           [phone], Esophagogastroduodenoscopy, flexible,                            transoral; diagnostic, including collection of                            specimen(s) by brushing or washing, when performed                            (separate procedure) Diagnosis Code(s):        --- Professional ---                           R10.13, Epigastric pain                           R11.2, Nausea with vomiting, unspecified CPT copyright 2016 [employer]. All rights reserved. The codes documented in this report are preliminary and upon coder review may  be revised to meet current compliance requirements. Ladene Artist, MD 10/07/2016 11:16:26 AM This report has been signed electronically. Number of Addenda: 0

## 2016-10-08 DIAGNOSIS — N183 Chronic kidney disease, stage 3 (moderate): Secondary | ICD-10-CM

## 2016-10-08 LAB — GLUCOSE, CAPILLARY
GLUCOSE-CAPILLARY: 105 mg/dL — AB (ref 65–99)
Glucose-Capillary: 198 mg/dL — ABNORMAL HIGH (ref 65–99)
Glucose-Capillary: 172 mg/dL — ABNORMAL HIGH (ref 65–99)

## 2016-10-08 MED ORDER — HALOPERIDOL 5 MG PO TABS
5.0000 mg | ORAL_TABLET | Freq: Every day | ORAL | Status: DC
  Administered 2016-10-08 – 2016-10-10 (×3): 5 mg via ORAL
  Filled 2016-10-08 (×7): qty 1

## 2016-10-08 MED ORDER — LORATADINE 10 MG PO TABS
10.0000 mg | ORAL_TABLET | Freq: Every day | ORAL | Status: DC
  Administered 2016-10-09 – 2016-10-11 (×3): 10 mg via ORAL
  Filled 2016-10-08 (×3): qty 1

## 2016-10-08 MED ORDER — ENSURE ENLIVE PO LIQD
237.0000 mL | Freq: Two times a day (BID) | ORAL | Status: DC
  Administered 2016-10-08 – 2016-10-09 (×2): 237 mL via ORAL

## 2016-10-08 MED ORDER — DOCUSATE SODIUM 100 MG PO CAPS
100.0000 mg | ORAL_CAPSULE | Freq: Two times a day (BID) | ORAL | Status: DC
  Administered 2016-10-10 – 2016-10-11 (×4): 100 mg via ORAL
  Filled 2016-10-08 (×7): qty 1

## 2016-10-08 MED ORDER — ADULT MULTIVITAMIN W/MINERALS CH
1.0000 | ORAL_TABLET | Freq: Every day | ORAL | Status: DC
  Administered 2016-10-09 – 2016-10-11 (×3): 1 via ORAL
  Filled 2016-10-08 (×3): qty 1

## 2016-10-08 MED ORDER — LURASIDONE HCL 20 MG PO TABS
20.0000 mg | ORAL_TABLET | Freq: Every day | ORAL | Status: DC
  Administered 2016-10-09 – 2016-10-11 (×3): 20 mg via ORAL
  Filled 2016-10-08 (×3): qty 1

## 2016-10-08 MED ORDER — VITAMIN B-1 100 MG PO TABS
100.0000 mg | ORAL_TABLET | Freq: Every day | ORAL | Status: DC
  Administered 2016-10-09 – 2016-10-11 (×3): 100 mg via ORAL
  Filled 2016-10-08 (×3): qty 1

## 2016-10-08 MED ORDER — MELATONIN 3 MG PO TABS
9.0000 mg | ORAL_TABLET | Freq: Every day | ORAL | Status: DC
  Administered 2016-10-08 – 2016-10-10 (×3): 9 mg via ORAL
  Filled 2016-10-08 (×6): qty 3

## 2016-10-08 MED ORDER — ATORVASTATIN CALCIUM 20 MG PO TABS
20.0000 mg | ORAL_TABLET | Freq: Every day | ORAL | Status: DC
  Administered 2016-10-08 – 2016-10-10 (×3): 20 mg via ORAL
  Filled 2016-10-08 (×3): qty 1

## 2016-10-08 MED ORDER — FOLIC ACID 1 MG PO TABS
1.0000 mg | ORAL_TABLET | Freq: Every day | ORAL | Status: DC
  Administered 2016-10-09 – 2016-10-11 (×3): 1 mg via ORAL
  Filled 2016-10-08 (×3): qty 1

## 2016-10-08 MED ORDER — LAMOTRIGINE 100 MG PO TABS
200.0000 mg | ORAL_TABLET | Freq: Every day | ORAL | Status: DC
  Administered 2016-10-09 – 2016-10-11 (×3): 200 mg via ORAL
  Filled 2016-10-08 (×3): qty 2

## 2016-10-08 MED ORDER — PANTOPRAZOLE SODIUM 40 MG PO TBEC
40.0000 mg | DELAYED_RELEASE_TABLET | Freq: Two times a day (BID) | ORAL | Status: DC
  Administered 2016-10-08 – 2016-10-11 (×6): 40 mg via ORAL
  Filled 2016-10-08 (×6): qty 1

## 2016-10-08 MED ORDER — LEVOTHYROXINE SODIUM 75 MCG PO TABS
150.0000 ug | ORAL_TABLET | Freq: Every day | ORAL | Status: DC
  Administered 2016-10-09 – 2016-10-11 (×3): 150 ug via ORAL
  Filled 2016-10-08 (×3): qty 2

## 2016-10-08 MED ORDER — TOPIRAMATE 25 MG PO TABS
50.0000 mg | ORAL_TABLET | Freq: Two times a day (BID) | ORAL | Status: DC
  Administered 2016-10-08 – 2016-10-11 (×6): 50 mg via ORAL
  Filled 2016-10-08 (×7): qty 2

## 2016-10-08 NOTE — Progress Notes (Signed)
Pt refuses vitals and cbg.

## 2016-10-08 NOTE — Progress Notes (Signed)
Nutrition Follow-up  DOCUMENTATION CODES:   Not applicable  INTERVENTION:   -Continue Boost Breeze po TID, each supplement provides 250 kcal and 9 grams of protein -Ensure Enlive po BID, each supplement provides 350 kcal and 20 grams of protein  NUTRITION DIAGNOSIS:   Inadequate oral intake related to nausea, vomiting, chronic illness (eating disorder) as evidenced by meal completion < 25%, per patient/family report.  Ongoing  GOAL:   Patient will meet greater than or equal to 90% of their needs  Progressing  MONITOR:   PO intake, Diet advancement, Labs, Weight trends, TF tolerance, Skin, I & O's  REASON FOR ASSESSMENT:   Consult, Malnutrition Screening Tool Enteral/tube feeding initiation and management  ASSESSMENT:   Brandi Love is a 41 y.o. female with medical history significant for congenital hypothyroidism, hypertriglyceridemia with dyslipidemia, major depressive disorder with OCD and severe eating disorder manifested by bulimia and history of laxative abuse. She also has a history of endometriosis status post partial hysterectomy, reflux, hypertension, remote PE, and genital deafness, stage III chronic kidney disease and overactive bladder as well as anemia of chronic disease. She was recently discharged on 6/8 And admission for sepsis related to pyelonephritis.  Case discussed with RN, who requested this RD speak with pt. Per RN, TF now on hold. RN has been encouraging supplements, consuming 3 supplements daily (combination of Boost Breeze and Ensure).  Spoke with pt, who was very anxious at time of visit. She expressed concern about shutting off TF. Pt still has cortrak tube in place and states that she is very upset about TF being turned off. She states "they're trying to get me out of here too fast". RD discussed role of nutrition (both PO and nutrition support). Praised pt for trying new foods that she did not try during previous admission and trialing nutritional  adequacy by mouth, especially since intake has picked up. Pt stated "It makes me very upset that they turned this off. I need nutrition and I won't be able to take all my calories in by mouth". Allowed pt to vent concerns and discussed importance of consuming both meals and supplements to assist in obtaining adequate nutrition. Provided emotional support to pt.   Pt reports she consumed 3 ice pops, 3 Boost Breeze supplements, and 1.5 Ensure supplements yesterday (1625 kcals, 57 grams protein, approximately 98% of minimum estimated kcal needs and 71% of estimated minimum protein needs). Pt reports consuming only one ice pop and Boost Breeze supplement today (350 kcals, 9 grams protein). Lunch tray is not delivered yet, however, pt expressed desire to at least try all foods on meal tray.   Spoke with RNCM regarding discharge plan, who confirms pt unable to discharge to ED facility with NGT.   Labs reviewed: CBGS: [id number].   Diet Order:  Diet full liquid Room service appropriate? Yes; Fluid consistency: Thin  Skin:  Reviewed, no issues  Last BM:  10/06/16  Height:   Ht Readings from Last 1 Encounters:  10/03/16 5\' 8"  (1.727 m)    Weight:   Wt Readings from Last 1 Encounters:  10/08/16 172 lb 1.6 oz (78.1 kg)    Ideal Body Weight:  63.6 kg  BMI:  Body mass index is 26.17 kg/m.  Estimated Nutritional Needs:   Kcal:  1650-1850  Protein:  80-95 grams  Fluid:  >1.6 L  EDUCATION NEEDS:   Education needs no appropriate at this time  Marylynne Keelin A. Jimmye Norman, RD, LDN, CDE Pager: [phone] After hours Pager: [phone]

## 2016-10-08 NOTE — Evaluation (Signed)
Physical Therapy Evaluation Patient Details Name: Brandi Love MRN: [id number] DOB: Sep 11, 1975 Today's Date: 10/08/2016   History of Present Illness  Pt is a 41 yo female admitted wtih intractable nausea, vomiting and associated dehydration dx with acute kidney injury secondary to severe eating disorder. PMH includes anxiety, depression, CKD, HTN, HLD, hearing loss and long standing eating disorder.   Clinical Impression  Pt admitted with above diagnosis. Pt currently with functional limitations due to the deficits listed below (see PT Problem List). Pt is min guard for bed mobility and minA for transfers and ambulation of 10 feet with RW. Pt is limited by weakness and fatigue.  Pt will benefit from skilled PT to increase their independence and safety with mobility to allow discharge to the venue listed below.       Follow Up Recommendations Supervision/Assistance - 24 hour;Other (comment) (inpatient rehab for eating disorders)    Equipment Recommendations  None recommended by PT    Recommendations for Other Services       Precautions / Restrictions Precautions Precautions: Fall Restrictions Weight Bearing Restrictions: No      Mobility  Bed Mobility Overal bed mobility: Needs Assistance Bed Mobility: Supine to Sit     Supine to sit: Min guard     General bed mobility comments: min guard for safety, vc for pushing into bed surface to scoot hips to EoB  Transfers Overall transfer level: Needs assistance Equipment used: Rolling walker (2 wheeled) Transfers: Sit to/from Stand Sit to Stand: Min assist         General transfer comment: minA for powerup and steadying with RW  Ambulation/Gait Ambulation/Gait assistance: Min assist Ambulation Distance (Feet): 10 Feet Assistive device: Rolling walker (2 wheeled) Gait Pattern/deviations: Shuffle;Trunk flexed;Step-through pattern;Decreased step length - right;Decreased step length - left Gait velocity: slowed Gait  velocity interpretation: Below normal speed for age/gender General Gait Details: minA for steadying pt utilizes knee hyperextension to keep upright due to decreased LE strength       Balance Overall balance assessment: Needs assistance Sitting-balance support: Bilateral upper extremity supported;Feet supported Sitting balance-Leahy Scale: Fair     Standing balance support: Bilateral upper extremity supported Standing balance-Leahy Scale: Poor Standing balance comment: requires RW for static standing                             Pertinent Vitals/Pain Pain Assessment: 0-10 Pain Score: 8  Pain Location: headache and body wide pain Pain Descriptors / Indicators: Headache;Aching;Constant Pain Intervention(s): Patient requesting pain meds-RN notified;Monitored during session;Limited activity within patient's tolerance  VSS    Home Living Family/patient expects to be discharged to:: Other (Comment) (Skilled eating disorder clinic) Living Arrangements: Alone                    Prior Function Level of Independence: Independent with assistive device(s)         Comments: household ambulation with RW     Hand Dominance        Extremity/Trunk Assessment   Upper Extremity Assessment Upper Extremity Assessment: Generalized weakness    Lower Extremity Assessment Lower Extremity Assessment: RLE deficits/detail;LLE deficits/detail RLE Deficits / Details: R LE strength grossly 3-/5  LLE Deficits / Details: L LE strength grossly 3-/5       Communication   Communication: Deaf;Other (comment) (reads lips)  Cognition Arousal/Alertness: Awake/alert Behavior During Therapy: WFL for tasks assessed/performed;Flat affect Overall Cognitive Status: Within Functional Limits for tasks assessed  Area of Impairment: Safety/judgement                         Safety/Judgement: Decreased awareness of deficits;Decreased awareness of safety     General Comments:  reportedly multiple falls while in hospital from not using her walker             Assessment/Plan    PT Assessment Patient needs continued PT services  PT Problem List Decreased strength;Decreased range of motion;Decreased activity tolerance;Decreased balance;Decreased mobility;Decreased safety awareness;Pain       PT Treatment Interventions DME instruction;Gait training;Functional mobility training;Therapeutic activities;Therapeutic exercise;Balance training;Patient/family education    PT Goals (Current goals can be found in the Care Plan section)  Acute Rehab PT Goals Patient Stated Goal: have less pain PT Goal Formulation: With patient Time For Goal Achievement: 10/22/16 Potential to Achieve Goals: Fair    Frequency Min 3X/week    AM-PAC PT "6 Clicks" Daily Activity  Outcome Measure Difficulty turning over in bed (including adjusting bedclothes, sheets and blankets)?: A Lot Difficulty moving from lying on back to sitting on the side of the bed? : A Lot Difficulty sitting down on and standing up from a chair with arms (e.g., wheelchair, bedside commode, etc,.)?: Total Help needed moving to and from a bed to chair (including a wheelchair)?: A Little Help needed walking in hospital room?: A Little Help needed climbing 3-5 steps with a railing? : Total 6 Click Score: 12    End of Session Equipment Utilized During Treatment: Gait belt Activity Tolerance: Patient limited by fatigue;Patient limited by pain Patient left: with call bell/phone within reach;with bed alarm set Nurse Communication: Mobility status;Patient requests pain meds;Precautions PT Visit Diagnosis: Unsteadiness on feet (R26.81);Other abnormalities of gait and mobility (R26.89);History of falling (Z91.81);Repeated falls (R29.6);Muscle weakness (generalized) (M62.81);Pain;Difficulty in walking, not elsewhere classified (R26.2) Pain - part of body:  (headache)    Time: 5681-2751 PT Time Calculation (min)  (ACUTE ONLY): 35 min   Charges:   PT Evaluation $PT Eval Moderate Complexity: 1 Procedure PT Treatments $Therapeutic Activity: 8-22 mins   PT G Codes:        Corey Laski B. Migdalia Dk PT, DPT Acute Rehabilitation  [phone] Pager [phone]    [place] 10/08/2016, 6:34 PM

## 2016-10-08 NOTE — Progress Notes (Signed)
PROGRESS NOTE   Brandi Love  GDJ:[id number]    DOB: 1975-08-19    DOA: 09/29/2016  PCP: Vickii Penna, MD   I have briefly reviewed patients previous medical records in [facility].  Brief Narrative:  41 year old female with PMH of anemia, anxiety and depression, chronic kidney disease, borderline DM, severe eating disorder/bulimia/laxative abuse history, OCD, GERD, HTN, HLD, congenital hypothyroid, hearing loss/cochlear implant, remote PE, seizure, recent hospitalization 09/18/16-09/27/16 for sepsis due to pyelonephritis, hypotension, acute psychosis and evaluated by psychiatry, needed short-term IVC, stabilized and discharged home, return to ED on 09/29/16 reporting that she had not eaten anything since that discharge (stated that she had not eaten for 27 days on admission), intractable nausea and vomiting, inability to keep anything by mouth, diffuse abdominal cramping and blood or mucus in stools. She had similar episode in 2017 when she underwent colonoscopy that revealed small internal hemorrhoids and a small anal fissure but no significant abnormalities. Admitted for intractable nausea, vomiting, associated dehydration, acute kidney injury and metabolic acidosis. Case discussed with her outpatient eating disorder dietitian and recommended tube feedings. Cor track placed and tube feeding initiated 6/11. Status post EGD 6/18, normal. Advance diet as tolerated.  Assessment & Plan:   Principal Problem:   Acute kidney injury ([place]) Active Problems:   OCD (obsessive compulsive disorder)   Eating disorder   Deafness congenital   Intractable nausea and vomiting   Bloody diarrhea   AKI (acute kidney injury) ([place])   Severe dehydration   Severe protein-calorie malnutrition ([facility])   Congenital hypothyroidism   Hypertriglyceridemia   Anemia   Endometriosis   MDD (major depressive disorder)   Hyperlipidemia   CKD (chronic kidney disease), stage III   OAB (overactive  bladder)   1. Intractable nausea and vomiting: In the setting of severe eating disorder, laxative abuse and apparent bulimia. As per patient's report, has been unable to tolerate any oral intake for 27 days on admission. Admitting team discussed with her outpatient eating disorder nutritionist who recommended tube feedings. Cor track was placed on 6/11 and continue postpyloric tube feeding, tolerating same. Continue IV PPI and when necessary IV antiemetics. Discussed in detail with patient's outpatient RD Ms. Lynden Ang on 6/12 >she indicated that patient's recent eating disorder relapse has drastically affected her mental and physical health and strongly recommends ongoing inpatient care including tube feedings until patient is able to gradually tolerate adequate oral intake to sustain nutritional needs prior to considering discharge. Search ongoing for inpatient eating disorder facility placement. [facility] was consulted. EGD 6/18 normal. GI signed off. Continue with tube feeds, patient tolerating clear liquid diet, asking if she can be advanced to full liquid, which I have directed, encouraged her to increase her oral intake, and if she continues to improve I told her in the next 24 to 48-hour I would change her to bland diet . 2. Dehydration: Secondary to GI losses and poor oral intake.Tube feeding. Dehydration resolved. 3. Acute kidney injury complicating stage III chronic kidney disease: Baseline creatinine not known but may be in the 1.5-1.6. Presented with creatinine of 2.2. Secondary to dehydration. Creatinine has plateaued at 1.6 which may be her baseline. Hyperkalemia resolved. Periodically follow BMP. 4. Non-anion gap metabolic acidosis: Secondary to GI losses. Resolved. 5. Blood and mucus in stools: Reviewed stool in bedside commode on 6/11 and no further blood noted. Continue to monitor. C. difficile testing negative. Abdominal x-ray without acute findings. Started empirically on Flagyl on  admission,? DC. No  further blood in stools. Diarrhea seems to be improving. C. difficile testing and GI pathogen panel PCR negative-discontinued Flagyl. Resolved. 6. Psychiatry (severe eating disorder/anxiety/depression/OCD/borderline personality disorder): Evaluated by psychiatry during previous admission. Documented reluctance in the past to take psychotropic medications despite professed desire to improve. No acute psychiatric issues at this time. Social worker consulted for placement to inpatient eating disorder facility at discharge. Psychiatry consultation appreciated, recommend continued current treatment. Patient did take her medications today 7. Seizure disorder: Reviewed her PTA home medication list in detail with pharmacist and resume as many as meds via tube feed or one or 2 oral as tolerated. States that she had a remote seizure 1 due to "overdose of pain medications" that were given to her. Patient now taking her medications. 8. Essential hypertension: Toprol held due to soft blood pressures in the context of dehydration. Stable. 9. Congenital hypothyroidism: Followed by endocrinology at [facility]. Continue Synthroid. 10. Congenital deafness/cochlear implants 11. Hyperlipidemia: Resume statins when able to take. 12. Anemia: Likely hemoconcentration on admission. Stable. 13. Overactive bladder: 14. Borderline DM: Not on medications at home. Most recent A1c: 5.5.   DVT prophylaxis: DC Lovenox due to reported blood in stools on admission. SCDs. Code Status: Full Family Communication: None at bedside Disposition: DC when medically stable,? to inpatient eating disorder facility.   Consultants:  Psychiatry [facility]  Procedures:  Cor track placed 6/11 EGD 6/18  Antimicrobials:  Flagyl -discontinued   Subjective: Reports she is feeling better today, no vomiting, had some nausea but controlled, asking to advance her diet to full liquid, she was encouraged to ambulate today     Objective:  Vitals:   10/07/16 1431 10/07/16 2210 10/08/16 0428 10/08/16 1341  BP: 105/62 107/64  (!) 101/58  Pulse: (!) 114 (!) 111  (!) 111  Resp: 15 16  16   Temp: 99 F (37.2 C) 99 F (37.2 C)  98.7 F (37.1 C)  TempSrc: Oral     SpO2: 93% 94%  96%  Weight:   78.1 kg (172 lb 1.6 oz)   Height:        Examination:  General exam: Pleasant young female, moderately built and nourished,Laying comfortably in bed Awake alert 3, no focal neurological deficits Lungs clear to auscultation bilaterally, no use of accessory muscles, good air entry S1 and S2 here, regular rate and rhythm, no rubs, gallops Abdomen soft, nontender, nondistended, bowel sounds present Extremities with no edema, clubbing or cyanosis   Data Reviewed: I have personally reviewed following labs and imaging studies  CBC:  Recent Labs Lab 10/01/16 1855  WBC 7.5  HGB 11.8*  HCT 35.7*  MCV 91.8  PLT 782   Basic Metabolic Panel:  Recent Labs Lab 10/01/16 1855 10/02/16 0758 10/03/16 0436 10/07/16 0559  NA 140 137 137 135  K 3.7 5.4* 3.9 4.0  CL 113* 110 106 101  CO2 19* 21* 23 24  GLUCOSE 101* 105* 110* 112*  BUN 14 12 14  23*  CREATININE 1.61* 1.62* 1.67* 1.63*  CALCIUM 9.1 9.0 9.0 9.0   CBG:  Recent Labs Lab 10/07/16 1202 10/07/16 1643 10/07/16 2027 10/08/16 0940 10/08/16 1138  GLUCAP 85 144* 156* 198* 172*    Recent Results (from the past 240 hour(s))  Gastrointestinal Panel by PCR , Stool     Status: None   Collection Time: 09/29/16  2:30 PM  Result Value Ref Range Status   Campylobacter species NOT DETECTED NOT DETECTED Final   Plesimonas shigelloides NOT DETECTED NOT DETECTED  Final   Salmonella species NOT DETECTED NOT DETECTED Final   Yersinia enterocolitica NOT DETECTED NOT DETECTED Final   Vibrio species NOT DETECTED NOT DETECTED Final   Vibrio cholerae NOT DETECTED NOT DETECTED Final   Enteroaggregative E coli (EAEC) NOT DETECTED NOT DETECTED Final    Enteropathogenic E coli (EPEC) NOT DETECTED NOT DETECTED Final   Enterotoxigenic E coli (ETEC) NOT DETECTED NOT DETECTED Final   Shiga like toxin producing E coli (STEC) NOT DETECTED NOT DETECTED Final   Shigella/Enteroinvasive E coli (EIEC) NOT DETECTED NOT DETECTED Final   Cryptosporidium NOT DETECTED NOT DETECTED Final   Cyclospora cayetanensis NOT DETECTED NOT DETECTED Final   Entamoeba histolytica NOT DETECTED NOT DETECTED Final   Giardia lamblia NOT DETECTED NOT DETECTED Final   Adenovirus F40/41 NOT DETECTED NOT DETECTED Final   Astrovirus NOT DETECTED NOT DETECTED Final   Norovirus GI/GII NOT DETECTED NOT DETECTED Final   Rotavirus A NOT DETECTED NOT DETECTED Final   Sapovirus (I, II, IV, and V) NOT DETECTED NOT DETECTED Final  C difficile quick scan w PCR reflex     Status: None   Collection Time: 09/29/16  2:30 PM  Result Value Ref Range Status   C Diff antigen NEGATIVE NEGATIVE Final   C Diff toxin NEGATIVE NEGATIVE Final   C Diff interpretation No C. difficile detected.  Final         Radiology Studies: Dg Abd Portable 1v  Result Date: 10/07/2016 CLINICAL DATA:  Enteric tube placement. EXAM: PORTABLE ABDOMEN - 1 VIEW COMPARISON:  09/30/2016 FINDINGS: The feeding tube terminates over the midline of the central abdomen, more proximal in location than on the prior study and likely in the proximal third portion of the duodenum. Gas is present in loops of grossly nondilated bowel in the central to lower abdomen, incompletely imaged though without evidence of bowel obstruction. Mild bibasilar lung opacities likely reflect atelectasis. No acute osseous abnormality is seen. IMPRESSION: Interval retraction of feeding tube into the proximal third portion of the duodenum. Electronically Signed   By: Logan Bores M.D.   On: 10/07/2016 15:04        Scheduled Meds: . atorvastatin  20 mg Oral q1800  . docusate sodium  100 mg Oral BID  . feeding supplement  1 Container Oral TID BM   . feeding supplement (ENSURE ENLIVE)  237 mL Oral BID BM  . [START ON 07/16/7122] folic acid  1 mg Oral Daily  . haloperidol  5 mg Oral QHS  . [START ON 10/09/2016] lamoTRIgine  200 mg Oral Daily  . [START ON 10/09/2016] levothyroxine  150 mcg Oral QAC breakfast  . [START ON 10/09/2016] loratadine  10 mg Oral Daily  . [START ON 10/09/2016] lurasidone  20 mg Oral Q breakfast  . Melatonin  9 mg Oral Q2000  . [START ON 10/09/2016] multivitamin with minerals  1 tablet Oral Daily  . pantoprazole  40 mg Oral BID  . QUEtiapine  150 mg Oral QHS  . [START ON 10/09/2016] thiamine  100 mg Oral Daily  . topiramate  50 mg Oral BID  . venlafaxine XR  150 mg Oral Q breakfast   Continuous Infusions: . feeding supplement (OSMOLITE 1.2 CAL) Stopped (10/08/16 1035)  . ondansetron (ZOFRAN) IV Stopped (10/08/16 1041)     LOS: 9 days    Orvis Stann, MD [facility] Pager [phone]  If 7PM-7AM, please contact night-coverage [email or web address] Password [facility] 10/08/2016, 4:04 PM

## 2016-10-08 NOTE — Consult Note (Signed)
Visited patient in her room today.  She was sitting up in her chair for the first time.  Endoscopy yesterday did not reveal any major medical issues.  Stefannie curious about her symptoms.  Explained that anxiety can increased GI distress.  As she has gotten better nutrition and her anxiety has improved as a result, her GI symptoms are slowly starting to improve.  She confirms improving N/V.  No vomiting, but still some nausea.  She continues to take,  medication for nausea.  She is trying to take more medications by mouth and this morning took all meds by mouth.  She is aware that [facility], residential treatment center for eating disorders in [place], will not admit her with an NG tube.  She is trying to increase her intake by mouth  She did finish a push pop while I was there today.  She confirms eating/drinking more, but is concerned about the calories.  We challenged that today and she knows she needs improved itake to get better.  She is willing to try to take all fluids/foods in her full liquid diet today and attempt to progress to bland diet tomorrow.  We continue to wait decision from Medicaid about single-case agreement for eating disorder placement.  I request she stay inpatient until she is able to physically tolerate solid food by mouth and move around on her own without human assistance as she lives alone  I am available for consultation if needed [phone]

## 2016-10-09 ENCOUNTER — Encounter: Payer: Self-pay | Admitting: *Deleted

## 2016-10-09 ENCOUNTER — Encounter (HOSPITAL_COMMUNITY): Payer: Self-pay | Admitting: Gastroenterology

## 2016-10-09 LAB — BASIC METABOLIC PANEL
ANION GAP: 9 (ref 5–15)
BUN: 25 mg/dL — ABNORMAL HIGH (ref 6–20)
CALCIUM: 9.2 mg/dL (ref 8.9–10.3)
CHLORIDE: 105 mmol/L (ref 101–111)
CO2: 24 mmol/L (ref 22–32)
Creatinine, Ser: 1.63 mg/dL — ABNORMAL HIGH (ref 0.44–1.00)
GFR calc non Af Amer: 39 mL/min — ABNORMAL LOW (ref >60)
GFR, EST AFRICAN AMERICAN: 45 mL/min — AB (ref >60)
Glucose, Bld: 115 mg/dL — ABNORMAL HIGH (ref 65–99)
POTASSIUM: 4.1 mmol/L (ref 3.5–5.1)
Sodium: 138 mmol/L (ref 135–145)

## 2016-10-09 LAB — GLUCOSE, CAPILLARY
GLUCOSE-CAPILLARY: 152 mg/dL — AB (ref 65–99)
GLUCOSE-CAPILLARY: 169 mg/dL — AB (ref 65–99)
Glucose-Capillary: 224 mg/dL — ABNORMAL HIGH (ref 65–99)
Glucose-Capillary: 184 mg/dL — ABNORMAL HIGH (ref 65–99)

## 2016-10-09 MED ORDER — INSULIN ASPART 100 UNIT/ML ~~LOC~~ SOLN
0.0000 [IU] | Freq: Three times a day (TID) | SUBCUTANEOUS | Status: DC
  Administered 2016-10-09: 2 [IU] via SUBCUTANEOUS
  Administered 2016-10-10 (×2): 3 [IU] via SUBCUTANEOUS
  Administered 2016-10-11: 2 [IU] via SUBCUTANEOUS
  Administered 2016-10-11: 1 [IU] via SUBCUTANEOUS

## 2016-10-09 MED ORDER — ENSURE ENLIVE PO LIQD
237.0000 mL | Freq: Three times a day (TID) | ORAL | Status: DC
  Administered 2016-10-10 (×2): 237 mL via ORAL

## 2016-10-09 MED ORDER — OSMOLITE 1.2 CAL PO LIQD
1000.0000 mL | ORAL | Status: DC
  Administered 2016-10-09: 1000 mL
  Filled 2016-10-09 (×2): qty 1000

## 2016-10-09 NOTE — Progress Notes (Signed)
Physical Therapy Treatment Patient Details Name: MAKENIZE MESSMAN MRN: [id number] DOB: 08-09-1975 Today's Date: 10/09/2016    History of Present Illness Pt is a 41 yo female admitted wtih intractable nausea, vomiting and associated dehydration dx with acute kidney injury secondary to severe eating disorder. PMH includes anxiety, depression, CKD, HTN, HLD, hearing loss and long standing eating disorder.     PT Comments    Pt presented supine in bed with HOB elevated, awake and willing to participate in therapy session. Pt making slow progress with mobility and continues to require min A for safety and stability with transfers. With gait, pt is unsteady but no overt LOB or need for physical assistance. However, gait speed is very slow and gait pattern is shuffling with trunk flexed. Pt is at a high risk for falls. Pt would continue to benefit from skilled physical therapy services at this time while admitted and after d/c to address the below listed limitations in order to improve overall safety and independence with functional mobility.    Follow Up Recommendations  SNF;Supervision/Assistance - 24 hour     Equipment Recommendations  None recommended by PT    Recommendations for Other Services       Precautions / Restrictions Precautions Precautions: Fall Restrictions Weight Bearing Restrictions: No    Mobility  Bed Mobility Overal bed mobility: Needs Assistance Bed Mobility: Supine to Sit;Sit to Supine     Supine to sit: Min guard Sit to supine: Min guard   General bed mobility comments: min guard for safety, use of bed rails, increased time  Transfers Overall transfer level: Needs assistance Equipment used: Rolling walker (2 wheeled) Transfers: Sit to/from [facility] Sit to Stand: Min assist Stand pivot transfers: Min assist       General transfer comment: increased time, cueing for technique and to scoot forwards, min A to power into standing and with  stability during pivot  Ambulation/Gait Ambulation/Gait assistance: Min guard Ambulation Distance (Feet): 15 Feet Assistive device: Rolling walker (2 wheeled) Gait Pattern/deviations: Shuffle;Trunk flexed;Step-through pattern;Decreased step length - right;Decreased step length - left Gait velocity: decreased Gait velocity interpretation: Below normal speed for age/gender General Gait Details: modest instability but no overt LOB or need for physical assistance, min guard for safety. At end, pt with some reported dizziness that resolved with sitting   Stairs            Wheelchair Mobility    Modified Rankin (Stroke Patients Only)       Balance Overall balance assessment: Needs assistance Sitting-balance support: Bilateral upper extremity supported;Feet supported Sitting balance-Leahy Scale: Fair     Standing balance support: Bilateral upper extremity supported Standing balance-Leahy Scale: Poor Standing balance comment: requires RW for static standing                            Cognition Arousal/Alertness: Awake/alert Behavior During Therapy: WFL for tasks assessed/performed Overall Cognitive Status: Within Functional Limits for tasks assessed                                        Exercises      General Comments        Pertinent Vitals/Pain Pain Assessment: Faces Faces Pain Scale: No hurt    Home Living  Prior Function            PT Goals (current goals can now be found in the care plan section) Acute Rehab PT Goals PT Goal Formulation: With patient Time For Goal Achievement: 10/22/16 Potential to Achieve Goals: Fair Progress towards PT goals: Progressing toward goals    Frequency    Min 3X/week      PT Plan Current plan remains appropriate;Discharge plan needs to be updated    Co-evaluation              AM-PAC PT "6 Clicks" Daily Activity  Outcome Measure  Difficulty  turning over in bed (including adjusting bedclothes, sheets and blankets)?: A Little Difficulty moving from lying on back to sitting on the side of the bed? : A Little Difficulty sitting down on and standing up from a chair with arms (e.g., wheelchair, bedside commode, etc,.)?: Total Help needed moving to and from a bed to chair (including a wheelchair)?: A Little Help needed walking in hospital room?: A Little Help needed climbing 3-5 steps with a railing? : Total 6 Click Score: 14    End of Session Equipment Utilized During Treatment: Gait belt Activity Tolerance: Patient limited by fatigue Patient left: in bed;with call bell/phone within reach;with bed alarm set Nurse Communication: Mobility status PT Visit Diagnosis: Other abnormalities of gait and mobility (R26.89);Repeated falls (R29.6);Muscle weakness (generalized) (M62.81)     Time: 2952-8413 PT Time Calculation (min) (ACUTE ONLY): 33 min  Charges:  $Gait Training: 8-22 mins $Therapeutic Activity: 8-22 mins                    G Codes:       [place], [place], [place] 10/09/2016, 10:34 AM

## 2016-10-09 NOTE — Progress Notes (Signed)
PROGRESS NOTE   Brandi Love  IRW:[id number]    DOB: June 08, 1975    DOA: 09/29/2016  PCP: Vickii Penna, MD   I have briefly reviewed patients previous medical records in [facility]  Brief Narrative:  41 year old female with PMH of anemia, anxiety and depression, chronic kidney disease, borderline DM, severe eating disorder/bulimia/laxative abuse history, OCD, GERD, HTN, HLD, congenital hypothyroid, hearing loss/cochlear implant, remote PE, seizure, recent hospitalization 09/18/16-09/27/16 for sepsis due to pyelonephritis, hypotension, acute psychosis and evaluated by psychiatry, needed short-term IVC, stabilized and discharged home, return to ED on 09/29/16 reporting that she had not eaten anything since that discharge (stated that she had not eaten for 27 days on admission), intractable nausea and vomiting, inability to keep anything by mouth, diffuse abdominal cramping and blood or mucus in stools. She had similar episode in 2017 when she underwent colonoscopy that revealed small internal hemorrhoids and a small anal fissure but no significant abnormalities. Admitted for intractable nausea, vomiting, associated dehydration, acute kidney injury and metabolic acidosis. Case discussed with her outpatient eating disorder dietitian and recommended tube feedings. Cor track placed and tube feeding initiated 6/11. Status post EGD 6/18, normal. Advance diet as tolerated.  Assessment & Plan:   Principal Problem:   Acute kidney injury ([place]) Active Problems:   OCD (obsessive compulsive disorder)   Eating disorder   Deafness congenital   Intractable nausea and vomiting   Bloody diarrhea   AKI (acute kidney injury) ([place])   Severe dehydration   Severe protein-calorie malnutrition ([facility])   Congenital hypothyroidism   Hypertriglyceridemia   Anemia   Endometriosis   MDD (major depressive disorder)   Hyperlipidemia   CKD (chronic kidney disease), stage III   OAB (overactive  bladder)  Intractable nausea and vomiting:  - In the setting of severe eating disorder, laxative abuse and apparent bulimia. As per patient's report, has been unable to tolerate any oral intake for 27 days on admission. Admitting team discussed with her outpatient eating disorder nutritionist who recommended tube feedings. Cor track was placed on 6/11 and continue postpyloric tube feeding, tolerating same. - Continue IV PPI and when necessary IV antiemetics. Discussed in detail with patient's outpatient RD Ms. Lynden Ang on 6/12 >she indicated that patient's recent eating disorder relapse has drastically affected her mental and physical health and strongly recommends ongoing inpatient care including tube feedings until patient is able to gradually tolerate adequate oral intake to sustain nutritional needs prior to considering discharge. Search ongoing for inpatient eating disorder facility placement. [facility] was consulted. EGD 6/18 normal. GI signed off. Continue with tube feeds - Patient remains with unreliable oral intake, but I have changed her tube feed only to nighttime, to allow for improvement of appetite during daytime to see if she can tolerate an oral intake.  Dehydration: -  Secondary to GI losses and poor oral intake.Tube feeding. Dehydration resolved.  Acute kidney injury complicating stage III chronic kidney disease: - Baseline creatinine not known but may be in the 1.5-1.6. Presented with creatinine of 2.2. Secondary to dehydration. Creatinine has plateaued at 1.6 which may be her baseline. Hyperkalemia resolved. Periodically follow BMP.  Non-anion gap metabolic acidosis:  - Secondary to GI losses. Resolved.  Blood and mucus in stools:  - Reviewed stool in bedside commode on 6/11 and no further blood noted. Continue to monitor. C. difficile testing negative. Abdominal x-ray without acute findings. Started empirically on Flagyl on admission,? DC. No further blood in stools.  Diarrhea seems  to be improving. C. difficile testing and GI pathogen panel PCR negative-discontinued Flagyl. Resolved.  Psychiatry (severe eating disorder/anxiety/depression/OCD/borderline personality disorder):  - Evaluated by psychiatry during previous admission. Documented reluctance in the past to take psychotropic medications despite professed desire to improve. No acute psychiatric issues at this time. Social worker consulted for placement to inpatient eating disorder facility at discharge. Psychiatry consultation appreciated, recommend continued current treatment. Patient did take her medications today  Seizure disorder:  - Reviewed her PTA home medication list in detail with pharmacist and resume as many as meds via tube feed or one or 2 oral as tolerated. States that she had a remote seizure 1 due to "overdose of pain medications" that were given to her. Patient now taking her medications.  Essential hypertension:  - Toprol held due to soft blood pressures in the context of dehydration. Stable.  Congenital hypothyroidism:  - Followed [facility] at [facility]. Continue Synthroid.  Congenital deafness/cochlear implants  Hyperlipidemia:  - Resume statins when able to take.  Anemia:  - Likely hemoconcentration on admission. Stable.  Overactive bladder:  Borderline DM: Not on medications at home. Most recent A1c: 5.5. Her CBGs are uncontrolled, will start her on insulin sliding scale   DVT prophylaxis:  SCDs. Subcutaneous heparin Code Status: Full Family Communication: None at bedside Disposition: DC when medically stable,? to inpatient eating disorder facility.   Consultants:  Psychiatry [facility]  Procedures:  Cor track placed 6/11 EGD 6/18  Antimicrobials:  Flagyl -discontinued   Subjective: Reports some nausea, report inhibitor tolerating oral intake, but no vomiting, denies  lightheadedness    Objective:  Vitals:   10/08/16 1341 10/08/16 2154 10/09/16 0500  10/09/16 1439  BP: (!) 101/58 113/79  100/68  Pulse: (!) 111 (!) 122  (!) 118  Resp: 16 18  16   Temp: 98.7 F (37.1 C) 98.3 F (36.8 C)  98.6 F (37 C)  TempSrc:  Oral    SpO2: 96% 96%  95%  Weight:   78.5 kg (173 lb 1 oz)   Height:        Examination:  General exam:   Pleasant young female, moderately built and nourished,Laying comfortably in bed Awake alert 3, no focal neurological deficits Lungs clear to auscultation bilaterally, no use of accessory muscles, good air entry, stable S1 and S2 here, regular rate and rhythm, no rubs, gallops, stable Abdomen soft, nontender, nondistended, bowel sounds present, stable Extremities with no edema, clubbing or cyanosis   Data Reviewed: I have personally reviewed following labs and imaging studies  CBC: No results for input(s): WBC, NEUTROABS, HGB, HCT, MCV, PLT in the last 168 hours. Basic Metabolic Panel:  Recent Labs Lab 10/03/16 0436 10/07/16 0559 10/09/16 0734  NA 137 135 138  K 3.9 4.0 4.1  CL 106 101 105  CO2 23 24 24   GLUCOSE 110* 112* 115*  BUN 14 23* 25*  CREATININE 1.67* 1.63* 1.63*  CALCIUM 9.0 9.0 9.2   CBG:  Recent Labs Lab 10/07/16 2027 10/08/16 0940 10/08/16 1138 10/08/16 2009 10/09/16 1142  GLUCAP 156* 198* 172* 105* 224*    No results found for this or any previous visit (from the past 240 hour(s)).       Radiology Studies: Dg Abd Portable 1v  Result Date: 10/07/2016 CLINICAL DATA:  Enteric tube placement. EXAM: PORTABLE ABDOMEN - 1 VIEW COMPARISON:  09/30/2016 FINDINGS: The feeding tube terminates over the midline of the central abdomen, more proximal in location than on the prior study and  likely in the proximal third portion of the duodenum. Gas is present in loops of grossly nondilated bowel in the central to lower abdomen, incompletely imaged though without evidence of bowel obstruction. Mild bibasilar lung opacities likely reflect atelectasis. No acute osseous abnormality is seen.  IMPRESSION: Interval retraction of feeding tube into the proximal third portion of the duodenum. Electronically Signed   By: Logan Bores M.D.   On: 10/07/2016 15:04        Scheduled Meds: . atorvastatin  20 mg Oral q1800  . docusate sodium  100 mg Oral BID  . feeding supplement  1 Container Oral TID BM  . feeding supplement (ENSURE ENLIVE)  237 mL Oral TID BM  . folic acid  1 mg Oral Daily  . haloperidol  5 mg Oral QHS  . lamoTRIgine  200 mg Oral Daily  . levothyroxine  150 mcg Oral QAC breakfast  . loratadine  10 mg Oral Daily  . lurasidone  20 mg Oral Q breakfast  . Melatonin  9 mg Oral Q2000  . multivitamin with minerals  1 tablet Oral Daily  . pantoprazole  40 mg Oral BID  . QUEtiapine  150 mg Oral QHS  . thiamine  100 mg Oral Daily  . topiramate  50 mg Oral BID  . venlafaxine XR  150 mg Oral Q breakfast   Continuous Infusions: . feeding supplement (OSMOLITE 1.2 CAL)    . ondansetron (ZOFRAN) IV Stopped (10/09/16 1138)     LOS: 10 days    Shawnee Higham, MD [facility] Pager [phone]  If 7PM-7AM, please contact night-coverage [email or web address] Password [email or web address] 10/09/2016, 2:51 PM

## 2016-10-09 NOTE — Progress Notes (Signed)
Pt was ambulated by this RN. Pt was encouraged to walk as far as she could go. Pt walked from bed to doorway and said she wanted to turn around, stated she felt dizzy. Pt tolerated well and encouraged to increase her walking and movements to improve condition. Pt agreed. Will continue to monitor.

## 2016-10-10 DIAGNOSIS — E031 Congenital hypothyroidism without goiter: Secondary | ICD-10-CM

## 2016-10-10 LAB — GLUCOSE, CAPILLARY
GLUCOSE-CAPILLARY: 227 mg/dL — AB (ref 65–99)
GLUCOSE-CAPILLARY: 218 mg/dL — AB (ref 65–99)
Glucose-Capillary: 120 mg/dL — ABNORMAL HIGH (ref 65–99)
Glucose-Capillary: 157 mg/dL — ABNORMAL HIGH (ref 65–99)

## 2016-10-10 NOTE — Progress Notes (Signed)
Nutrition Follow-up  DOCUMENTATION CODES:   Not applicable  INTERVENTION:   -Continue Boost Breeze po TID, each supplement provides 250 kcal and 9 grams of protein -D/c Ensure Enlive po BID, each supplement provides 350 kcal and 20 grams of protein -Continue nocturnal feedings of Osmolite 1.2 @ 60 ml/hr over a 12 hour period via cortrak tube. Regimen providing 864kcal (52% of needs), 40grams of protein, and 1523ml of H2O  NUTRITION DIAGNOSIS:   Inadequate oral intake related to nausea, vomiting, chronic illness (eating disorder) as evidenced by meal completion < 25%, per patient/family report.  Ongoing  GOAL:   Patient will meet greater than or equal to 90% of their needs  Progressing  MONITOR:   PO intake, Diet advancement, Labs, Weight trends, TF tolerance, Skin, I & O's  REASON FOR ASSESSMENT:   Consult, Malnutrition Screening Tool Enteral/tube feeding initiation and management  ASSESSMENT:   Brandi Love is a 41 y.o. female with medical history significant for congenital hypothyroidism, hypertriglyceridemia with dyslipidemia, major depressive disorder with OCD and severe eating disorder manifested by bulimia and history of laxative abuse. She also has a history of endometriosis status post partial hysterectomy, reflux, hypertension, remote PE, and genital deafness, stage III chronic kidney disease and overactive bladder as well as anemia of chronic disease. She was recently discharged on 6/8 And admission for sepsis related to pyelonephritis.  Spoke with CSW, plan is to pursue SNF placement to accoumate TF.   Spoke with pt at bedside. She is upset and anxious over possibility of SNF placement; RD provided emotional support and encouragement to pt and praised her for progress thus far.   Pt still complains of abdominal pain, but overall doing better. Pt shares that she is consuming 3-4 Boost Breeze supplements daily ([phone] kcals and 27-36 grams protein daily). Pt is  also consuming pudding, ice pops, and jello. She tired som soup yesterday evening, but did not like them.   She is taking meds by mouth in pudding or applesauce without difficulty.   Pt transitioned to nocturnal feeding on 09/2016. Receiving Osmolite 1.2 @ 60 ml/hr over a 12 hour period via cortrak tube. Regimen providing 864kcal (52% of needs), 40grams of protein, and 1510ml of H2O. RN reports good tolerance of TF, however, reports concern over elevated CBGS. Supplement regimen adjusted to maximize oral intake.   Labs reviewed: CBGS: 120-210.   Diet Order:  Diet full liquid Room service appropriate? Yes; Fluid consistency: Thin  Skin:  Reviewed, no issues  Last BM:  10/09/16  Height:   Ht Readings from Last 1 Encounters:  10/03/16 5\' 8"  (1.727 m)    Weight:   Wt Readings from Last 1 Encounters:  10/10/16 173 lb 3.2 oz (78.6 kg)    Ideal Body Weight:  63.6 kg  BMI:  Body mass index is 26.33 kg/m.  Estimated Nutritional Needs:   Kcal:  1650-1850  Protein:  80-95 grams  Fluid:  >1.6 L  EDUCATION NEEDS:   Education needs no appropriate at this time  Zalika Tieszen A. Jimmye Norman, RD, LDN, CDE Pager: [phone] After hours Pager: [phone]

## 2016-10-10 NOTE — Progress Notes (Signed)
CSW informed by Medicaid that patient has been denied for inpatient rehab program but they will continue to pay for current therapies/programs.  CSW informed patient that eating disorder rehab placement has been denied.  Patient upset but expressed understanding.  CSW spoke with patient about PT recommendation for SNF she originally not agreeable to this plan but acknowledges she would have no one available at home to be with her and assist.  RNCM spoke with patient and informed she would not be safe to return home with NG tube- patient now open to idea of SNF.  CSW initiating referral process and has applied for [facility]- under manual review.  CSW will continue to follow  Jorge Ny, LCSW Clinical Social Worker [phone]

## 2016-10-10 NOTE — Progress Notes (Signed)
PROGRESS NOTE   Brandi Love  OEV:[id number]    DOB: July 26, 1975    DOA: 09/29/2016  PCP: Vickii Penna, MD   I have briefly reviewed patients previous medical records in [facility].  Brief Narrative:  41 year old female with PMH of anemia, anxiety and depression, chronic kidney disease, borderline DM, severe eating disorder/bulimia/laxative abuse history, OCD, GERD, HTN, HLD, congenital hypothyroid, hearing loss/cochlear implant, remote PE, seizure, recent hospitalization 09/18/16-09/27/16 for sepsis due to pyelonephritis, hypotension, acute psychosis and evaluated by psychiatry, needed short-term IVC, stabilized and discharged home, return to ED on 09/29/16 reporting that she had not eaten anything since that discharge (stated that she had not eaten for 27 days on admission), intractable nausea and vomiting, inability to keep anything by mouth, diffuse abdominal cramping and blood or mucus in stools. She had similar episode in 2017 when she underwent colonoscopy that revealed small internal hemorrhoids and a small anal fissure but no significant abnormalities. Admitted for intractable nausea, vomiting, associated dehydration, acute kidney injury and metabolic acidosis. Case discussed with her outpatient eating disorder dietitian and recommended tube feedings. Cor track placed and tube feeding initiated 6/11. Status post EGD 6/18, normal. Advance diet as tolerated.  Assessment & Plan:   Principal Problem:   Acute kidney injury ([place]) Active Problems:   OCD (obsessive compulsive disorder)   Eating disorder   Deafness congenital   Intractable nausea and vomiting   Bloody diarrhea   AKI (acute kidney injury) ([place])   Severe dehydration   Severe protein-calorie malnutrition (HCC)   Congenital hypothyroidism   Hypertriglyceridemia   Anemia   Endometriosis   MDD (major depressive disorder)   Hyperlipidemia   CKD (chronic kidney disease), stage III   OAB (overactive  bladder)  Intractable nausea and vomiting In the setting of severe eating disorder - Chest with her long-term nutritionist, she does have nontypical anorexia, with some bulimic features. Admitting team discussed with her outpatient eating disorder nutritionist who recommended tube feedings. Cor track was placed on 6/11 and continue postpyloric tube feeding, tolerating same. - Patient continue have significant nausea, but no further vomiting, went for EGD 6/18 which was essentially normal, GI has signed off, meanwhile continue with IV PPI, and continue with IV antiemetics . - Dr. Algis Liming Discussed in detail with patient's outpatient RD Ms. Lynden Ang on 6/12 >she indicated that patient's recent eating disorder relapse has drastically affected her mental and physical health and strongly recommends ongoing inpatient care including tube feedings until patient is able to gradually tolerate adequate oral intake to sustain nutritional needs prior to considering discharge. Search ongoing for inpatient eating disorder facility placement.  - Discussed with patient her plan of care, currently will continue with nocturnal tube feeding, daytime will try to encourage oral intake, so far she has been drinking her breeze, her meds by mouth, but she still taking her insurer. She will feed during daytime.  Dehydration: -  Secondary to GI losses and poor oral intake.Tube feeding. Dehydration resolved.  Generalized weakness - Patient with significant deconditioning, seen by PT yesterday who recommended SNF placement, as well today discussed plan with the patient, will try to have her sitting in recliner most of the day, instead of laying on her bed, and will try to ambulate her a few times by staff today.  Acute kidney injury complicating stage III chronic kidney disease: - Baseline creatinine not known but may be in the 1.5-1.6. Presented with creatinine of 2.2. Secondary to dehydration. Creatinine has plateaued at  1.6 which may be her baseline. Hyperkalemia resolved. Periodically follow BMP.  Non-anion gap metabolic acidosis:  - Secondary to GI losses. Resolved.  Blood and mucus in stools:  - Reviewed stool in bedside commode on 6/11 and no further blood noted. Continue to monitor. C. difficile testing negative. Abdominal x-ray without acute findings. Started empirically on Flagyl on admission,? DC. No further blood in stools. Diarrhea seems to be improving. C. difficile testing and GI pathogen panel PCR negative-discontinued Flagyl. Resolved.  Psychiatry (severe eating disorder/anxiety/depression/OCD/borderline personality disorder):  - Evaluated by psychiatry during previous admission. Documented reluctance in the past to take psychotropic medications despite professed desire to improve. No acute psychiatric issues at this time. Social worker consulted for placement to inpatient eating disorder facility at discharge. Psychiatry consultation appreciated, recommend continued current treatment. Patient did take her medications today  Seizure disorder:  - Reviewed her PTA home medication list in detail with pharmacist and resume as many as meds via tube feed or one or 2 oral as tolerated. States that she had a remote seizure 1 due to "overdose of pain medications" that were given to her. Patient now taking her medications.  Essential hypertension:  - Toprol held due to soft blood pressures in the context of dehydration. Stable. - Patient with some mild sinus tachycardia, this is secondary to holding her beta blockers, possible secondary to deconditioning and poor mobility.  Congenital hypothyroidism:  - Followed by [facility]ocr[facility]ology at [facility]. Continue Synthroid.  Congenital deafness/cochlear implants  Hyperlipidemia:  - Resume statins when able to take.  Anemia:  - Likely hemoconcentration on admission. Stable.  Overactive bladder:  Borderline DM: - Started to become elevated, so started an insulin  sliding scale, most recent A1c 3 years ago, so will obtain new level   DVT prophylaxis:  SCDs. Subcutaneous heparin Code Status: Full Family Communication: None at bedside Disposition: DC when medically stable, awaiting when her oral intake is reliable and she doesn't require tube feeding if further   Consultants:  Psychiatry [facility]  Procedures:  Cor track placed 6/11 EGD 6/18  Antimicrobials:  Flagyl -discontinued   Subjective: Reports she was able to tolerate boost yesterday, take her medications are yesterday, had to take her insurance through cor track during the day , was on tube feeds overnight, was able to ambulate with assistant in the room with staff yesterday .   Objective:  Vitals:   10/08/16 2154 10/09/16 0500 10/09/16 1439 10/09/16 2112  BP: 113/79  100/68 117/77  Pulse: (!) 122  (!) 118 (!) 111  Resp: 18  16 17   Temp: 98.3 F (36.8 C)  98.6 F (37 C) 98.9 F (37.2 C)  TempSrc: Oral   Oral  SpO2: 96%  95% 98%  Weight:  78.5 kg (173 lb 1 oz)    Height:        Examination:  General exam:   Pleasant young female, moderately built and nourished,Laying comfortably in bed Awake alert 3, no focal neurological deficits, stable Lungs clear to auscultation bilaterally, no use of accessory muscles, good air entry, stable S1 and S2 here, regular rate and rhythm, no rubs, gallops, stable Abdomen soft, nontender, nondistended, bowel sounds present, stable Extremities with no edema, clubbing or cyanosis   Data Reviewed: I have personally reviewed following labs and imaging studies  CBC: No results for input(s): WBC, NEUTROABS, HGB, HCT, MCV, PLT in the last 168 hours. Basic Metabolic Panel:  Recent Labs Lab 10/07/16 0559 10/09/16 0734  NA 135 138  K 4.0 4.1  CL 101 105  CO2 24 24  GLUCOSE 112* 115*  BUN 23* 25*  CREATININE 1.63* 1.63*  CALCIUM 9.0 9.2   CBG:  Recent Labs Lab 10/09/16 1142 10/09/16 1603 10/09/16 1754 10/09/16 2114  10/10/16 0736  GLUCAP 224* 152* 169* 184* 227*    No results found for this or any previous visit (from the past 240 hour(s)).       Radiology Studies: No results found.      Scheduled Meds: . atorvastatin  20 mg Oral q1800  . docusate sodium  100 mg Oral BID  . feeding supplement  1 Container Oral TID BM  . feeding supplement (ENSURE ENLIVE)  237 mL Oral TID BM  . folic acid  1 mg Oral Daily  . haloperidol  5 mg Oral QHS  . insulin aspart  0-9 Units Subcutaneous TID WC  . lamoTRIgine  200 mg Oral Daily  . levothyroxine  150 mcg Oral QAC breakfast  . loratadine  10 mg Oral Daily  . lurasidone  20 mg Oral Q breakfast  . Melatonin  9 mg Oral Q2000  . multivitamin with minerals  1 tablet Oral Daily  . pantoprazole  40 mg Oral BID  . QUEtiapine  150 mg Oral QHS  . thiamine  100 mg Oral Daily  . topiramate  50 mg Oral BID  . venlafaxine XR  150 mg Oral Q breakfast   Continuous Infusions: . feeding supplement (OSMOLITE 1.2 CAL) Stopped (10/10/16 0742)  . ondansetron (ZOFRAN) IV Stopped (10/10/16 1040)     LOS: 72 days    Teresa Lemmerman, MD [facility] Pager [phone]  If 7PM-7AM, please contact night-coverage [email or web address] Password [facility] 10/10/2016, 11:51 AM

## 2016-10-10 NOTE — Progress Notes (Signed)
Pt will have bed at [facility] for tomorrow- they can accommodate NG tube  PASAR pending- pt cannot DC to SNF without pasar approval  Pt updated and agreeable  CSW will continue to follow  Jorge Ny, [place] Social Worker [phone]

## 2016-10-10 NOTE — Progress Notes (Signed)
Feeding tube was restarted at 40cc/hr. The tube was flushed before  with 30cc sterile water. Will continue to monitor.

## 2016-10-10 NOTE — Progress Notes (Signed)
Ai this time staff offered to patient to help her to get out of bed and walk, but patient refused. She said she will try later. Will continue to monitor.

## 2016-10-10 NOTE — Progress Notes (Signed)
CM received call back from Dr. Tammi Klippel , pt's PCP. Dr. Tammi Klippel shared with CM if pt is d/c to home pt will REMOVE panda tube. Stated pt  is noncompliant in regard to eating and has no incite into illness, and will need supervision if continued tube feeding is needed @ d/c. Whitman Hero RN,BSN,CM

## 2016-10-10 NOTE — Progress Notes (Signed)
CM called pt's PCP, Dr. Molli Barrows,  to see if he would be able to manage pt with panda/ tube feeding once discharge. Development worker, international aid stated she would share information with  Dr.Manning and Dr. Tammi Klippel will call CM back shortly to discuss pt's case. Whitman Hero RN,BSN,CM

## 2016-10-10 NOTE — NC FL2 (Signed)
[place] LEVEL OF CARE SCREENING TOOL     IDENTIFICATION  Patient Name: Brandi Love Birthdate: 1975/07/16 Sex: female Admission Date (Current Location): 09/29/2016  [facility] and [place] Number:  Herbalist and Address:  [facility], [place] [address], [place], [place] [address]      Provider Number: [id number]  Attending Physician Name and Address:  Elgergawy, Silver Huguenin, MD  Relative Name and Phone Number:       Current Level of Care: Hospital Recommended Level of Care: [place] Prior Approval Number:    Date Approved/Denied:   PASRR Number:    Discharge Plan: SNF    Current Diagnoses: Patient Active Problem List   Diagnosis Date Noted  . Urinary tract infection with hematuria   . OCD (obsessive compulsive disorder) 09/29/2016  . Eating disorder 09/29/2016  . Deafness congenital 09/29/2016  . Intractable nausea and vomiting 09/29/2016  . Bloody diarrhea 09/29/2016  . AKI (acute kidney injury) ([place]) 09/29/2016  . Severe dehydration 09/29/2016  . Severe protein-calorie malnutrition ([place]) 09/29/2016  . Congenital hypothyroidism 09/29/2016  . Hypertriglyceridemia 09/29/2016  . Anemia 09/29/2016  . Endometriosis 09/29/2016  . MDD (major depressive disorder) 09/29/2016  . Hyperlipidemia 09/29/2016  . CKD (chronic kidney disease), stage III 09/29/2016  . Acute kidney injury ([place]) 09/29/2016  . OAB (overactive bladder) 09/29/2016  . Sepsis ([place]) 09/18/2016  . Acute pyelonephritis 09/18/2016  . MDD (major depressive disorder), recurrent, severe, with psychosis ([place]) 12/11/2014  . Eating disorder, unspecified 11/23/2012  . Hypothyroidism 11/23/2012  . Dyslipidemia 11/23/2012  . Sinus tachycardia 11/23/2012  . CKD (chronic kidney disease) stage 3, GFR 30-59 ml/min 11/18/2012  . Protein-calorie malnutrition, severe ([place]) 11/13/2012  . Abdominal pain, epigastric 11/12/2012  . Dehydration 11/12/2012  . Acute  on chronic renal failure ([place]) 11/12/2012  . Leukocytosis, unspecified 11/12/2012  . Hyponatremia 11/12/2012  . Borderline personality disorder 11/12/2012    Orientation RESPIRATION BLADDER Height & Weight     Self, Situation, Time, Place  Normal Continent Weight: 173 lb 1 oz (78.5 kg) Height:  5\' 8"  (172.7 cm)  BEHAVIORAL SYMPTOMS/MOOD NEUROLOGICAL BOWEL NUTRITION STATUS      Continent NG/panda  AMBULATORY STATUS COMMUNICATION OF NEEDS Skin   Limited Assist Verbally Normal                       Personal Care Assistance Level of Assistance  Bathing, Dressing Bathing Assistance: Limited assistance   Dressing Assistance: Limited assistance     Functional Limitations Info  Hearing   Hearing Info: Impaired (cochlear implants)      SPECIAL CARE FACTORS FREQUENCY  PT (By licensed PT), OT (By licensed OT)     PT Frequency: 5/wk OT Frequency: 5/wk            Contractures      Additional Factors Info  Code Status, Allergies, Isolation Precautions, Insulin Sliding Scale, Psychotropic Code Status Info: FULL Allergies Info: Dhea Nutritional Supplements, Entex Lq Phenylephrine-guaifenesin, Indomethacin, Iodinated Diagnostic Agents, Melatonin, Propoxyphene, Risperidone And Related, Shellfish Allergy, Depakote Divalproex Sodium, Abilify Aripiprazole, Dihydroergotamine, Doxycycline, Erythromycin, Lactose Intolerance (Gi), Nsaids, Tape Psychotropic Info: hadol, lamictal, seroquel Insulin Sliding Scale Info: 3/day Isolation Precautions Info: MRSA     Current Medications (10/10/2016):  This is the current hospital active medication list Current Facility-Administered Medications  Medication Dose Route Frequency Provider Last Rate Last Dose  . acetaminophen (TYLENOL) tablet 650 mg  650 mg Oral Q6H PRN Lissa Merlin,  Leisa Lenz, NP       Or  . acetaminophen (TYLENOL) suppository 650 mg  650 mg Rectal Q6H PRN Samella Parr, NP      . atorvastatin (LIPITOR) tablet 20 mg  20 mg Oral  q1800 Elgergawy, Silver Huguenin, MD   20 mg at 10/09/16 1801  . diphenhydrAMINE (BENADRYL) capsule 25-50 mg  25-50 mg Oral Q4H PRN Edwin Dada, MD   50 mg at 10/07/16 2202  . docusate sodium (COLACE) capsule 100 mg  100 mg Oral BID Elgergawy, Silver Huguenin, MD   100 mg at 10/10/16 1027  . feeding supplement (BOOST / RESOURCE BREEZE) liquid 1 Container  1 Container Oral TID BM Modena Jansky, MD   1 Container at 10/10/16 1024  . feeding supplement (ENSURE ENLIVE) (ENSURE ENLIVE) liquid 237 mL  237 mL Oral TID BM Elgergawy, Silver Huguenin, MD   237 mL at 10/10/16 1033  . feeding supplement (OSMOLITE 1.2 CAL) liquid 1,000 mL  1,000 mL Per Tube Continuous Elgergawy, Silver Huguenin, MD   Stopped at 10/10/16 [phone]  . folic acid (FOLVITE) tablet 1 mg  1 mg Oral Daily Elgergawy, Silver Huguenin, MD   1 mg at 10/10/16 1026  . haloperidol (HALDOL) tablet 5 mg  5 mg Oral QHS Elgergawy, Silver Huguenin, MD   5 mg at 10/10/16 0005  . hydrOXYzine (ATARAX/VISTARIL) tablet 50 mg  50 mg Per Tube TID PRN Modena Jansky, MD   50 mg at 10/05/16 1239  . insulin aspart (novoLOG) injection 0-9 Units  0-9 Units Subcutaneous TID WC Elgergawy, Silver Huguenin, MD   3 Units at 10/10/16 0757  . lamoTRIgine (LAMICTAL) tablet 200 mg  200 mg Oral Daily Elgergawy, Silver Huguenin, MD   200 mg at 10/10/16 1027  . levothyroxine (SYNTHROID, LEVOTHROID) tablet 150 mcg  150 mcg Oral QAC breakfast Elgergawy, Silver Huguenin, MD   150 mcg at 10/10/16 0754  . loratadine (CLARITIN) tablet 10 mg  10 mg Oral Daily Elgergawy, Silver Huguenin, MD   10 mg at 10/10/16 1027  . lurasidone (LATUDA) tablet 20 mg  20 mg Oral Q breakfast Elgergawy, Silver Huguenin, MD   20 mg at 10/10/16 0754  . Melatonin TABS 9 mg  9 mg Oral Q2000 Elgergawy, Silver Huguenin, MD   9 mg at 10/10/16 0006  . morphine 4 MG/ML injection 1-4 mg  1-4 mg Intravenous Q2H PRN Samella Parr, NP   2 mg at 10/10/16 1052  . multivitamin with minerals tablet 1 tablet  1 tablet Oral Daily Elgergawy, Silver Huguenin, MD   1 tablet at 10/10/16 1026  .  ondansetron (ZOFRAN) 8 mg in sodium chloride 0.9 % 50 mL IVPB  8 mg Intravenous Q6H Rumbarger, Valeda Malm, RPH   Stopped at 10/10/16 1040  . pantoprazole (PROTONIX) EC tablet 40 mg  40 mg Oral BID Elgergawy, Silver Huguenin, MD   40 mg at 10/10/16 1026  . phenol (CHLORASEPTIC) mouth spray 1 spray  1 spray Mouth/Throat PRN Modena Jansky, MD   1 spray at 09/30/16 1329  . promethazine (PHENERGAN) injection 25 mg  25 mg Intravenous Q6H PRN Gatha Mayer, MD   25 mg at 10/10/16 6203  . QUEtiapine (SEROQUEL XR) 24 hr tablet 150 mg  150 mg Oral QHS Modena Jansky, MD   150 mg at 10/10/16 0007  . thiamine (VITAMIN B-1) tablet 100 mg  100 mg Oral Daily Elgergawy, Silver Huguenin, MD   100 mg at 10/10/16 1026  .  topiramate (TOPAMAX) tablet 50 mg  50 mg Oral BID Elgergawy, Silver Huguenin, MD   50 mg at 10/10/16 1026  . venlafaxine XR (EFFEXOR-XR) 24 hr capsule 150 mg  150 mg Oral Q breakfast Modena Jansky, MD   150 mg at 10/10/16 9122     Discharge Medications: Please see discharge summary for a list of discharge medications.  Relevant Imaging Results:  Relevant Lab Results:   Additional Information SS#: [id number]  Jorge Ny, LCSW

## 2016-10-11 ENCOUNTER — Inpatient Hospital Stay (HOSPITAL_COMMUNITY): Payer: Medicare Other

## 2016-10-11 DIAGNOSIS — F5089 Other specified eating disorder: Secondary | ICD-10-CM

## 2016-10-11 LAB — BASIC METABOLIC PANEL
Anion gap: 9 (ref 5–15)
BUN: 23 mg/dL — AB (ref 6–20)
CHLORIDE: 106 mmol/L (ref 101–111)
CO2: 22 mmol/L (ref 22–32)
Calcium: 8.9 mg/dL (ref 8.9–10.3)
Creatinine, Ser: 1.46 mg/dL — ABNORMAL HIGH (ref 0.44–1.00)
GFR calc Af Amer: 52 mL/min — ABNORMAL LOW (ref >60)
GFR, EST NON AFRICAN AMERICAN: 45 mL/min — AB (ref >60)
Glucose, Bld: 164 mg/dL — ABNORMAL HIGH (ref 65–99)
POTASSIUM: 4 mmol/L (ref 3.5–5.1)
Sodium: 137 mmol/L (ref 135–145)

## 2016-10-11 LAB — GLUCOSE, CAPILLARY
Glucose-Capillary: 150 mg/dL — ABNORMAL HIGH (ref 65–99)
Glucose-Capillary: 159 mg/dL — ABNORMAL HIGH (ref 65–99)

## 2016-10-11 LAB — HEMOGLOBIN A1C
Hgb A1c MFr Bld: 5.5 % (ref 4.8–5.6)
MEAN PLASMA GLUCOSE: 111 mg/dL

## 2016-10-11 LAB — PHOSPHORUS: Phosphorus: 3.9 mg/dL (ref 2.5–4.6)

## 2016-10-11 MED ORDER — HYDROCODONE-ACETAMINOPHEN 5-300 MG PO TABS: 1.0000 | ORAL_TABLET | Freq: Four times a day (QID) | ORAL | 0 refills | Status: DC

## 2016-10-11 MED ORDER — INSULIN ASPART 100 UNIT/ML ~~LOC~~ SOLN: 0.0000 [IU] | Freq: Three times a day (TID) | SUBCUTANEOUS | 11 refills | Status: DC

## 2016-10-11 MED ORDER — OSMOLITE 1.2 CAL PO LIQD: 1000.0000 mL | ORAL | 0 refills | Status: DC

## 2016-10-11 MED ORDER — BOOST / RESOURCE BREEZE PO LIQD: 1.0000 | Freq: Three times a day (TID) | ORAL | 0 refills | Status: DC

## 2016-10-11 NOTE — Progress Notes (Signed)
Patient Cortrack is clogged.  Unable to flush the cortrack with 1 cc of sterile water.  Notified the charge nurse [place] nurse unable to flush to follow the protocol for clogged Cortrack. Stopped the feeding to continuous error messages.  And will notify the Dr. Jeronimo Greaves on call.  Will continue to  Monitor the patient.

## 2016-10-11 NOTE — Progress Notes (Signed)
Cortrak Tube Team Note:  RN notified cortrak team that tube would not flush. Re-inserted stylet and unable to pass. Removed tube.   A 10 F Cortrak tube was placed in the L nare and secured with a nasal bridle at 85 cm. Per the Cortrak monitor reading the tube tip is post pyloric.   X-ray is required, abdominal x-ray has been ordered by the Cortrak team. Please confirm tube placement before using the Cortrak tube.   If the tube becomes dislodged please keep the tube and contact the Cortrak team at [email or web address] (password [email or web address]) for replacement.  If after hours and replacement cannot be delayed, place a NG tube and confirm placement with an abdominal x-ray.    Maylon Peppers RD, [place], [place] Pager [phone] After Hours Pager'

## 2016-10-11 NOTE — Progress Notes (Signed)
RN trying to give report to [facility], to the nurse who is going to receive the patient. Nurse not available at this time.

## 2016-10-11 NOTE — Progress Notes (Addendum)
RN called Core track team to inform them that the tube is not working, couldn't be flushed. They will come to check the tube. Will continue to monitor.

## 2016-10-11 NOTE — Progress Notes (Signed)
Patient will DC to: [facility] Anticipated DC date: 10/11/16 Family notified: Parents Transport by: Corey Harold 4:30pm   Per MD patient ready for DC to [facility]. RN, patient, patient's family, and facility notified of DC. Discharge Summary and pasrr number sent to facility. RN given number for report. DC packet on chart. Ambulance transport requested for patient.   CSW signing off.  Cedric Fishman, [place] Social Worker [phone]

## 2016-10-11 NOTE — Discharge Summary (Addendum)
Brandi Love, is a 41 y.o. female  DOB 1975-12-08  MRN [id number].  Admission date:  09/29/2016  Admitting Physician  Elwin Mocha, MD  Discharge Date:  10/11/2016   Primary MD  Vickii Penna, MD  Recommendations for primary care physician for things to follow:  - Please check CBC, BMP in 3 days - Patient with eating disorder, currently on tube feeds via core track at nighttime, supplement during daytime, on full liquid diet, diet can be advanced as patient appetite allows, as well she can  be on soft diet if she wants to.   Admission Diagnosis  Dehydration [E86.0] Nausea & vomiting [R11.2] Intractable nausea and vomiting [R11.2] Acute kidney injury ([place]) [N17.9]   Discharge Diagnosis  Dehydration [E86.0] Nausea & vomiting [R11.2] Intractable nausea and vomiting [R11.2] Acute kidney injury ([place]) [N17.9]    Principal Problem:   Acute kidney injury ([place]) Active Problems:   OCD (obsessive compulsive disorder)   Eating disorder   Deafness congenital   Intractable nausea and vomiting   Bloody diarrhea   AKI (acute kidney injury) ([place])   Severe dehydration   Severe protein-calorie malnutrition (HCC)   Congenital hypothyroidism   Hypertriglyceridemia   Anemia   Endometriosis   MDD (major depressive disorder)   Hyperlipidemia   CKD (chronic kidney disease), stage III   OAB (overactive bladder)      Past Medical History:  Diagnosis Date  . Anemia   . Anxiety   . Arthritis    "hands" (09/18/2016)  . Chronic lower back pain   . Chronic renal insufficiency   . Deafness   . Depression   . Diabetes ([place])    "borderline"  . Eating disorder   . Endometriosis   . GERD (gastroesophageal reflux disease)   . History of kidney stones   . Hyperlipidemia   . Hypertension   . 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 Migraine    "frequency varies" (09/18/2016)  . OCD  (obsessive compulsive disorder)   . Pneumonia 2011  . PONV (postoperative nausea and vomiting)   . Pulmonary embolism ([place]) 10/2001  . Seizures ([place]) 09/2001 X 1   "day after my hysterectomy"  . Tachycardia     Past Surgical History:  Procedure Laterality Date  . ABDOMINAL HYSTERECTOMY  09/2001   'except for right ovary'  . BREAST LUMPECTOMY Left 2000   benign  . BREAST LUMPECTOMY Right 2002   benign  . COCHLEAR IMPLANT Bilateral 2004-2010   "right-left"  . COLONOSCOPY  06/2015    Dr Rolm Bookbinder at [facility] for rectal bleeding: internal hemorrhoids, small anal fissure.    . ESOPHAGOGASTRODUODENOSCOPY N/A 10/07/2016   Procedure: ESOPHAGOGASTRODUODENOSCOPY (EGD);  Surgeon: Ladene Artist, MD;  Location: [facility] ENDOSCOPY;  Service: Endoscopy;  Laterality: N/A;  . EYE MUSCLE SURGERY Left 1980   "lazy eye"  . EYE MUSCLE SURGERY Right 1990   "lazy eye"  . LAPAROSCOPIC ABDOMINAL EXPLORATION  2001 and 2002   for endometriosis  History of present illness and  Hospital Course:     Kindly see H&P for history of present illness and admission details, please review complete Labs, Consult reports and Test reports for all details in brief  HPI  from the history and physical done on the day of admission 09/29/2016 HPI: Brandi Love is a 41 y.o. female with medical history significant for congenital hypothyroidism, hypertriglyceridemia with dyslipidemia, major depressive disorder with OCD and severe eating disorder manifested by bulimia and history of laxative abuse. She also has a history of endometriosis status post partial hysterectomy, reflux, hypertension, remote PE, and genital deafness, stage III chronic kidney disease and overactive bladder as well as anemia of chronic disease. She was recently discharged on 6/8 And admission for sepsis related to pyelonephritis. During that hospitalization she also required short-term IVC due to exacerbation of her underlying psychiatric  conditions manifested by auditory hallucinations associated with limited insight and judgment and recurrent agitation. According to the psychiatric note she has been diagnosed with [place] personality disorder and told the intake psychiatrist that she desired to lose another 30 pounds by restricting her diet. She endorses a desire to improve in her psychiatric condition but was reluctant to take her prescribed psychotropic medications and was reluctant to eat. By time of discharge she was not exhibiting suicidal or homicidal ideation and IVC was removed. She was not eating but was deemed medically and psychiatrically stable to discharge and had plans in place to follow-up with multiple psychiatric practitioners including social worker, physician and nutritionist. Of note patient was not experiencing any abdominal pain or bloody stools prior to discharge. She reports that since discharge she still has not eaten anything and has not been at a keep liquids down stating it has now been 27 days since she has had any oral intake. She attempted to eat a pretty lean at 4 AM on Saturday but had immediate emesis. She has developed diffuse abdominal cramping with reports of bloody and mucoid stools. She has not had any stool since arrival to the ER. She does not use NSAIDs. She reports similar symptoms in 2017. In review of the medical record she underwent a colonoscopy that revealed small internal hemorrhoids and a small anal fissure but no other significant abnormalities. At the patient's insistence I called her feeding disorders nutritionist who relayed that her recommendation was for the patient to have short-term feeding tube placed to assist with nutrition and electrolyte replacement. In the ER she was found to have acute kidney injury, subtle leukocytosis, obvious signs of dehydration and metabolic acidemia. Her urinalysis was unremarkable.  ED Course:  Vital Signs: BP 112/80   Pulse (!) 102   Temp 98.5 F (36.9  C) (Oral)   Resp 17   SpO2 96%  Lab data: C. difficile PCR and gastrointestinal PCR panel ordered by ED PE but has not yet been collected secondary to no bowel movement since arrival; sodium 137, potassium 4.2, chloride 104, CO2 18, glucose 141, BUN 29, creatinine 2.2, calcium 11.1, anion gap 15, white count 11,700 with normal differential, hemoglobin 15.6, platelets 402,000 Medications and treatments: Normal saline bolus 2 L, Zofran 4 mg IV 1   Hospital Course   41 year old female with PMH of anemia, anxiety and depression, chronic kidney disease, borderline DM, severe eating disorder/bulimia/laxative abuse history, OCD, GERD, HTN, HLD, congenital hypothyroid, hearing loss/cochlear implant, remote PE, seizure, recent hospitalization 09/18/16-09/27/16 for sepsis due to pyelonephritis, hypotension, acute psychosis and evaluated by psychiatry, needed short-term IVC, stabilized  and discharged home, return to ED on 09/29/16 reporting that she had not eaten anything since that discharge (stated that she had not eaten for 27 days on admission), intractable nausea and vomiting, inability to keep anything by mouth, diffuse abdominal cramping and blood or mucus in stools. She had similar episode in 2017 when she underwent colonoscopy that revealed small internal hemorrhoids and a small anal fissure but no significant abnormalities. Admitted for intractable nausea, vomiting, associated dehydration, acute kidney injury and metabolic acidosis. Case discussed with her outpatient eating disorder dietitian and recommended tube feedings. Cor track placed and tube feeding initiated 6/11.the patient nutritional status significantly improved after tube feed has been started , accommodation has been made for eating disorder facility, but unfortunately her insurance did not approve that , oral intake been gradually improving, where she has been on nocturnal tube feeds only , plan to discharge to SNF with goal discontinued every  feed as her oral intake continues to improve .  1. Intractable nausea and vomiting: In the setting of severe eating disorder, laxative abuse and apparent bulimia. As per patient's report, has been unable to tolerate any oral intake for 27 days on admission. Admitting team discussed with her outpatient eating disorder nutritionist who recommended tube feedings. Cor track was placed on 6/11 and continue postpyloric tube feeding, for the last 48-72 hours, she has been transitioned to nocturnal cor track every feed, and she was encouraged for oral intake during daytime, she's been doing good. With full liquid diet, and supplements, she tends on average 3 please per day, and some ensure, as her appetite picks up and her oral intake increased, nocturnal tube feeds can be decreased gradually , until its discontinued. 2. Dehydration: Secondary to GI losses and poor oral intake. Dehydration resolved. 3. Acute kidney injury complicating stage III chronic kidney disease: Baseline creatinine not known but may be in the 1.5-1.6. Presented with creatinine of 2.2. Secondary to dehydration. Acute kidney injury has resolved, creatinine is 1.4 on discharge  4. Non-anion gap metabolic acidosis: Secondary to GI losses. Improved. 5. Blood and mucus in stools: Reviewed stool in bedside commode on 6/11 and no further blood noted. Continue to monitor. C. difficile testing negative. Abdominal x-ray without acute findings. Started empirically on Flagyl on admission,? DC. No further blood in stools. Diarrhea seems to be improving. C. difficile testing and GI pathogen panel PCR negative-discontinued Flagyl. Seems to resolve. 6. Psychiatry (severe eating disorder/anxiety/depression/OCD/borderline personality disorder): Evaluated by psychiatry during previous admission. Documented reluctance in the past to take psychotropic medications despite professed desire to improve. No acute psychiatric issues at this time. Social worker consulted  for placement to inpatient eating disorder facility at discharge. Psychiatry consultation appreciated, recommend continued current treatment. Patient now taking her medications. 7. Seizure disorder: Reviewed her PTA home medication list in detail with pharmacist and resume as many as meds via tube feed or one or 2 oral as tolerated. States that she had a remote seizure 1 due to "overdose of pain medications" that were given to her. Patient not taking her medications. 8. Essential hypertension: Toprol held due to soft blood pressures, blood pressure continues to be soft, so it was not resumed on discharge, she is with some mild tachycardia, this is most likely due to deconditioning and stopping her beta blockers. 9. Congenital hypothyroidism: Followed by endocrinology at [facility]. Continue Synthroid. 10. Congenital deafness/cochlear implants 11. Hyperlipidemia: Resume statins when able to take. 12. Anemia: Likely hemoconcentration on admission. Stable. 13. Overactive bladder: 14.  Borderline DM: Not on medications at home. Most recent A1c: 5.5. Her CBG has been increased recently, so she will be discharged on insulin sliding scale   Discharge Condition:  Stable   Follow UP With SNF physician Contact information for after-discharge care    Destination    [facility] .   Specialty:  [place] information: [place] [phone]              Discharge Instructions  and  Discharge Medications    Discharge Instructions    Discharge instructions    Complete by:  As directed    Follow with SNF physician  Get CBC, CMP,checked  by Primary MD next visit.    Activity: As tolerated with Full fall precautions use walker/cane & assistance as needed   Disposition SNF   Diet: A shunt on full liquid diet, which can be advanced as appetite allows, continue nocturnal tube feeds via core track from 7 AM  to 7 PM 40 mL/h Osmolite , to drink 3 breeze and 3 ensures daily mouth .   On your next visit with your primary care physician please Get Medicines reviewed and adjusted.   Please request your Prim.MD to go over all Hospital Tests and Procedure/Radiological results at the follow up, please get all Hospital records sent to your Prim MD by signing hospital release before you go home.   If you experience worsening of your admission symptoms, develop shortness of breath, life threatening emergency, suicidal or homicidal thoughts you must seek medical attention immediately by calling 911 or calling your MD immediately  if symptoms less severe.  You Must read complete instructions/literature along with all the possible adverse reactions/side effects for all the Medicines you take and that have been prescribed to you. Take any new Medicines after you have completely understood and accpet all the possible adverse reactions/side effects.   Do not drive, operating heavy machinery, perform activities at heights, swimming or participation in water activities or provide baby sitting services if your were admitted for syncope or siezures until you have seen by Primary MD or a Neurologist and advised to do so again.  Do not drive when taking Pain medications.    Do not take more than prescribed Pain, Sleep and Anxiety Medications  Special Instructions: If you have smoked or chewed Tobacco  in the last 2 yrs please stop smoking, stop any regular Alcohol  and or any Recreational drug use.  Wear Seat belts while driving.   Please note  You were cared for by a hospitalist during your hospital stay. If you have any questions about your discharge medications or the care you received while you were in the hospital after you are discharged, you can call the unit and asked to speak with the hospitalist on call if the hospitalist that took care of you is not available. Once you are discharged, your primary care  physician will handle any further medical issues. Please note that NO REFILLS for any discharge medications will be authorized once you are discharged, as it is imperative that you return to your primary care physician (or establish a relationship with a primary care physician if you do not have one) for your aftercare needs so that they can reassess your need for medications and monitor your lab values.   Increase activity slowly    Complete by:  As directed      Allergies as of 10/11/2016  Reactions   Dhea [nutritional Supplements] Nausea And Vomiting   Entex Lq [phenylephrine-guaifenesin] Shortness Of Breath, Swelling   Throat swelling   Indomethacin Nausea And Vomiting   Iodinated Diagnostic Agents Anaphylaxis   'cant breathe'   Melatonin Itching   Propoxyphene Anaphylaxis   Risperidone And Related Other (See Comments)   Out of body experience   Shellfish Allergy Other (See Comments)   unspecified   Depakote [divalproex Sodium] Nausea And Vomiting, Other (See Comments)   Weight gain   Abilify [aripiprazole] Nausea And Vomiting   Dihydroergotamine Nausea And Vomiting   Doxycycline Nausea And Vomiting   Erythromycin Nausea And Vomiting   Lactose Intolerance (gi) Nausea And Vomiting   Nsaids Other (See Comments)   Unable to ttake due to renal insufficency   Tape Other (See Comments)   Redness, can use paper tape      Medication List    STOP taking these medications   cetirizine 10 MG tablet Commonly known as:  ZYRTEC   cyclobenzaprine 10 MG tablet Commonly known as:  FLEXERIL   docusate sodium 100 MG capsule Commonly known as:  COLACE   magnesium oxide 400 MG tablet Commonly known as:  MAG-OX   metoprolol succinate 25 MG 24 hr tablet Commonly known as:  TOPROL-XL   SUMAtriptan 100 MG tablet Commonly known as:  IMITREX     TAKE these medications   aspirin EC 81 MG tablet Take 81 mg by mouth at bedtime.   atorvastatin 20 MG tablet Commonly known as:   LIPITOR Take 20 mg by mouth daily.   feeding supplement Liqd Take 1 Container by mouth 3 (three) times daily between meals.   feeding supplement (OSMOLITE 1.2 CAL) Liqd Place 1,000 mLs into feeding tube continuous. Patient on nocturnal tube feeds from 7 PM to 7 AM at 40 mL per hour via core track   feeding supplement Liqd Take 1 Container by mouth 3 (three) times daily between meals.   haloperidol 5 MG tablet Commonly known as:  HALDOL Take 5 mg by mouth at bedtime.   Hydrocodone-Acetaminophen 5-300 MG Tabs Take 1 tablet by mouth every 6 (six) hours.   hydrOXYzine 50 MG tablet Commonly known as:  ATARAX/VISTARIL Take 50 mg by mouth 3 (three) times daily as needed.   insulin aspart 100 UNIT/ML injection Commonly known as:  novoLOG Inject 0-9 Units into the skin 3 (three) times daily with meals.   lamoTRIgine 200 MG tablet Commonly known as:  LAMICTAL Take 200 mg by mouth daily.   LATUDA 20 MG Tabs tablet Generic drug:  lurasidone Take 20 mg by mouth daily.   levothyroxine 150 MCG tablet Commonly known as:  SYNTHROID, LEVOTHROID Take 150 mcg by mouth daily.   Melatonin 10 MG Tabs Take 10 mg by mouth at bedtime.   multivitamin with minerals Tabs tablet Take 1 tablet by mouth daily.   ondansetron 4 MG tablet Commonly known as:  ZOFRAN Take 4 mg by mouth every 8 (eight) hours as needed for nausea.   pantoprazole 40 MG tablet Commonly known as:  PROTONIX Take 40 mg by mouth daily.   promethazine 25 MG tablet Commonly known as:  PHENERGAN Take 1 tablet (25 mg total) by mouth every 6 (six) hours as needed for nausea or vomiting.   SEROQUEL XR 150 MG 24 hr tablet Generic drug:  QUEtiapine Fumarate Take 150 mg by mouth at bedtime.   topiramate 50 MG tablet Commonly known as:  TOPAMAX Take 50 mg by mouth  2 (two) times daily.   venlafaxine XR 150 MG 24 hr capsule Commonly known as:  EFFEXOR-XR Take 150 mg by mouth daily with breakfast.   Vitamin D  (Ergocalciferol) 50000 units Caps capsule Commonly known as:  DRISDOL Take 50,000 Units by mouth every 7 (seven) days. On Sunday         Diet and Activity recommendation: See Discharge Instructions above   Consults obtained -  Psychiatry GI   Major procedures and Radiology Reports - PLEASE review detailed and final reports for all details, in brief -   EGD   Dg Chest Port 1 View  Result Date: 09/18/2016 CLINICAL DATA:  Encounter for central line placement EXAM: PORTABLE CHEST 1 VIEW COMPARISON:  06/23/2014 CXR FINDINGS: The heart size and mediastinal contours are within normal limits. New right IJ central line catheter tip is seen at the cavoatrial junction. No pneumothorax. Both lungs are clear. The visualized skeletal structures are unremarkable. IMPRESSION: No pneumothorax status post right IJ central line catheter placement. Tip is at the cavoatrial junction. Electronically Signed   By: Ashley Royalty M.D.   On: 09/18/2016 21:42   Dg Abd 2 Views  Result Date: 09/29/2016 CLINICAL DATA:  Nausea and vomiting for 3 days. EXAM: ABDOMEN - 2 VIEW COMPARISON:  CT 06/28/2014 FINDINGS: Supine and right-sided decubitus views. No gaseous distention of bowel loops on supine imaging. Distal gas identified. No abnormal abdominal calcifications. No appendicolith. Right-sided decubitus view demonstrates no free intraperitoneal air or significant air-fluid levels. Right hemidiaphragm elevation. IMPRESSION: No acute findings. Electronically Signed   By: Abigail Miyamoto M.D.   On: 09/29/2016 18:00   Dg Abd Portable 1v  Result Date: 10/11/2016 CLINICAL DATA:  Nasogastric tube placement. EXAM: PORTABLE ABDOMEN - 1 VIEW COMPARISON:  Radiograph of October 07, 2016. FINDINGS: The bowel gas pattern is normal. Distal tip of feeding tube is seen in expected position of third portion of duodenum. IMPRESSION: Distal tip of feeding tube is seen in expected position of third portion of duodenum. No evidence of bowel  obstruction or ileus is noted. Electronically Signed   By: Marijo Conception, M.D.   On: 10/11/2016 15:14   Dg Abd Portable 1v  Result Date: 10/07/2016 CLINICAL DATA:  Enteric tube placement. EXAM: PORTABLE ABDOMEN - 1 VIEW COMPARISON:  09/30/2016 FINDINGS: The feeding tube terminates over the midline of the central abdomen, more proximal in location than on the prior study and likely in the proximal third portion of the duodenum. Gas is present in loops of grossly nondilated bowel in the central to lower abdomen, incompletely imaged though without evidence of bowel obstruction. Mild bibasilar lung opacities likely reflect atelectasis. No acute osseous abnormality is seen. IMPRESSION: Interval retraction of feeding tube into the proximal third portion of the duodenum. Electronically Signed   By: Logan Bores M.D.   On: 10/07/2016 15:04   Dg Abd Portable 1v  Result Date: 09/30/2016 CLINICAL DATA:  Feeding tube placement. EXAM: PORTABLE ABDOMEN - 1 VIEW COMPARISON:  09/29/2017 FINDINGS: Feeding tube enters the stomach, passes into the duodenum with its tip at the level of ligament of Treitz. IMPRESSION: Feeding tube tip at the ligament of Treitz. Electronically Signed   By: Nelson Chimes M.D.   On: 09/30/2016 16:30    Micro Results     No results found for this or any previous visit (from the past 240 hour(s)).     Today   Subjective:   Trenika Sulak today has no headache,no chest or  abdominal pain, reports some nausea, but no vomiting, has been tolerating breeze yesterday and some in short, she did tolerate some soft diet including Jell-O .  Objective:   Blood pressure 116/74, pulse (!) 117, temperature 98.6 F (37 C), temperature source Oral, resp. rate 18, height 5\' 8"  (1.727 m), weight 78.6 kg (173 lb 3.2 oz), SpO2 95 %.   Intake/Output Summary (Last 24 hours) at 10/11/16 1544 Last data filed at 10/10/16 1624  Gross per 24 hour  Intake               50 ml  Output                0 ml    Net               50 ml    Exam Awake Alert, Oriented x 3,core tack +. Symmetrical Chest wall movement, Good air movement bilaterally, CTAB RRR,No Gallops,Rubs or new Murmurs, No Parasternal Heave +ve B.Sounds, Abd Soft, Non tender,  No rebound -guarding or rigidity. No Cyanosis, Clubbing or edema, No new Rash or bruise  Data Review   CBC w Diff:  Lab Results  Component Value Date   WBC 7.5 10/01/2016   HGB 11.8 (L) 10/01/2016   HCT 35.7 (L) 10/01/2016   PLT 272 10/01/2016   LYMPHOPCT 25 09/29/2016   MONOPCT 9 09/29/2016   EOSPCT 1 09/29/2016   BASOPCT 0 09/29/2016    CMP:  Lab Results  Component Value Date   NA 137 10/11/2016   K 4.0 10/11/2016   CL 106 10/11/2016   CO2 22 10/11/2016   BUN 23 (H) 10/11/2016   CREATININE 1.46 (H) 10/11/2016   PROT 5.5 (L) 09/20/2016   ALBUMIN 2.4 (L) 09/20/2016   BILITOT 0.9 09/20/2016   ALKPHOS 67 09/20/2016   AST 15 09/20/2016   ALT 19 09/20/2016  .   Total Time in preparing paper work, data evaluation and todays exam - 35 minutes  ELGERGAWY, DAWOOD M.D on 10/11/2016 at 3:44 PM  [facility]   Office  [phone]

## 2016-10-11 NOTE — Progress Notes (Signed)
Contacted rapid response in regards to the clogged Cor track in the right nare.  Informed the rapid response team that I was unable to follow the procedure for clogged Cortracks due inability to flush with 1 cc. Informed to contact the [facility] team whenever they arrived.  Reported information to the day shift nurse.  Will continue to monitor

## 2016-10-11 NOTE — Clinical Social Work Note (Signed)
Clinical Social Work Assessment  Patient Details  Name: Brandi Love MRN: [id number] Date of Birth: [birth year]  Date of referral:  10/11/16               Reason for consult:  Facility Placement                Permission sought to share information with:  Facility Sport and exercise psychologist, Family Supports Permission granted to share information::  Yes, Verbal Permission Granted  Name::     Lynden Ang, Esperanza Heir, Parents  Agency::  SNFs  Relationship::     Contact Information:     Housing/Transportation Living arrangements for the past 2 months:  Apartment Source of Information:  Patient Patient Interpreter Needed:  None Criminal Activity/Legal Involvement Pertinent to Current Situation/Hospitalization:  No - Comment as needed Significant Relationships:  Parents, Mental Health Provider Lives with:  Self Do you feel safe going back to the place where you live?  No Need for family participation in patient care:  No (Coment)  Care giving concerns:  CSW received consult for possible SNF placement at time of discharge. CSW spoke with patient regarding PT recommendation of SNF placement at time of discharge. Patient reported that she lives alone and understands the recommendation and is agreeable to SNF placement at time of discharge. CSW to continue to follow and assist with discharge planning needs.   Social Worker assessment / plan:  CSW spoke with patient concerning possibility of rehab at [facility] before returning home.  Employment status:  Disabled (Comment on whether or not currently receiving Disability) Insurance information:  Medicare PT Recommendations:  Not assessed at this time Information / Referral to community resources:  [place]  Patient/Family's Response to care:  Patient recognizes need for rehab before returning home and is agreeable to a SNF in [place]. She is grateful for the chance to have her NG tube monitored while she gets stronger.    Patient/Family's Understanding of and Emotional Response to Diagnosis, Current Treatment, and Prognosis:  Patient/family is realistic regarding therapy needs and expressed being hopeful for SNF placement. Patient expressed understanding of CSW role and discharge process as well as her medical condition. No questions/concerns about plan or treatment.    Emotional Assessment Appearance:  Appears stated age Attitude/Demeanor/Rapport:  Other (Appropriate) Affect (typically observed):  Accepting, Appropriate Orientation:  Oriented to Self, Oriented to Situation, Oriented to Place, Oriented to  Time Alcohol / Substance use:  Not Applicable Psych involvement (Current and /or in the community):  Yes (Comment)  Discharge Needs  Concerns to be addressed:  Care Coordination Readmission within the last 30 days:  Yes Current discharge risk:  None Barriers to Discharge:  No Barriers Identified   Benard Halsted, [place] 10/11/2016, 2:49 PM

## 2016-10-11 NOTE — Clinical Social Work Placement (Signed)
   CLINICAL SOCIAL WORK PLACEMENT  NOTE  Date:  10/11/2016  Patient Details  Name: Brandi Love MRN: [id number] Date of Birth: [birth year]  Clinical Social Work is seeking post-discharge placement for this patient at the [place] level of care (*CSW will initial, date and re-position this form in  chart as items are completed):  Yes   Patient/family provided with [facility] Work Department's list of facilities offering this level of care within the geographic area requested by the patient (or if unable, by the patient's family).  Yes   Patient/family informed of their freedom to choose among providers that offer the needed level of care, that participate in Medicare, Medicaid or managed care program needed by the patient, have an available bed and are willing to accept the patient.  Yes   Patient/family informed of [facility]'s ownership interest in [facility] [place] and [facility], as well as of the fact that they are under no obligation to receive care at these facilities.  PASRR submitted to EDS on 10/10/16     PASRR number received on 10/11/16     Existing PASRR number confirmed on       FL2 transmitted to all facilities in geographic area requested by pt/family on 10/11/16     FL2 transmitted to all facilities within larger geographic area on       Patient informed that his/her managed care company has contracts with or will negotiate with certain facilities, including the following:        Yes   Patient/family informed of bed offers received.  Patient [facility] bed at [facility]     Physician recommends and patient chooses bed at      Patient to be transferred to [facility] on 10/11/16.  Patient to be transferred to facility by [facility]     Patient family notified on 10/11/16 of transfer.  Name of family member notified:  Parents     PHYSICIAN       Additional Comment:     _______________________________________________ Benard Halsted, [place] 10/11/2016, 2:53 PM

## 2016-10-11 NOTE — Discharge Instructions (Signed)
Follow with SNF physician  Get CBC, CMP,checked  by Primary MD next visit.    Activity: As tolerated with Full fall precautions use walker/cane & assistance as needed   Disposition SNF   Diet: A shunt on full liquid diet, which can be advanced as appetite allows, continue nocturnal tube feeds via core track from 7 AM to 7 PM 40 mL/h Osmolite , to drink 3 breeze and 3 ensures daily mouth .   On your next visit with your primary care physician please Get Medicines reviewed and adjusted.   Please request your Prim.MD to go over all Hospital Tests and Procedure/Radiological results at the follow up, please get all Hospital records sent to your Prim MD by signing hospital release before you go home.   If you experience worsening of your admission symptoms, develop shortness of breath, life threatening emergency, suicidal or homicidal thoughts you must seek medical attention immediately by calling 911 or calling your MD immediately  if symptoms less severe.  You Must read complete instructions/literature along with all the possible adverse reactions/side effects for all the Medicines you take and that have been prescribed to you. Take any new Medicines after you have completely understood and accpet all the possible adverse reactions/side effects.   Do not drive, operating heavy machinery, perform activities at heights, swimming or participation in water activities or provide baby sitting services if your were admitted for syncope or siezures until you have seen by Primary MD or a Neurologist and advised to do so again.  Do not drive when taking Pain medications.    Do not take more than prescribed Pain, Sleep and Anxiety Medications  Special Instructions: If you have smoked or chewed Tobacco  in the last 2 yrs please stop smoking, stop any regular Alcohol  and or any Recreational drug use.  Wear Seat belts while driving.   Please note  You were cared for by a hospitalist during your  hospital stay. If you have any questions about your discharge medications or the care you received while you were in the hospital after you are discharged, you can call the unit and asked to speak with the hospitalist on call if the hospitalist that took care of you is not available. Once you are discharged, your primary care physician will handle any further medical issues. Please note that NO REFILLS for any discharge medications will be authorized once you are discharged, as it is imperative that you return to your primary care physician (or establish a relationship with a primary care physician if you do not have one) for your aftercare needs so that they can reassess your need for medications and monitor your lab values.

## 2016-10-11 NOTE — Progress Notes (Signed)
Physical Therapy Treatment Patient Details Name: Brandi Love MRN: [id number] DOB: [birth year] Today's Date: 10/11/2016    History of Present Illness Pt is a 41 yo female admitted wtih intractable nausea, vomiting and associated dehydration dx with acute kidney injury secondary to severe eating disorder. PMH includes anxiety, depression, CKD, HTN, HLD, hearing loss and long standing eating disorder.     PT Comments    Progressing steadily now.  Emphasis on resistance exercise mostly gross LE extension, bed mobility without rail, scooting, standing and further ambulation with RW.  Follow Up Recommendations  SNF;Supervision/Assistance - 24 hour     Equipment Recommendations  None recommended by PT    Recommendations for Other Services       Precautions / Restrictions Precautions Precautions: Fall    Mobility  Bed Mobility Overal bed mobility: Needs Assistance Bed Mobility: Supine to Sit;Sit to Supine     Supine to sit: Min guard Sit to supine: Min guard   General bed mobility comments: min guard for safety, use of bed rails, increased time  Transfers Overall transfer level: Needs assistance   Transfers: Sit to/from Stand Sit to Stand: Min assist         General transfer comment: increased time, cues for hand placement and steady assist  Ambulation/Gait Ambulation/Gait assistance: Min assist;Min guard Ambulation Distance (Feet): 24 Feet Assistive device: Rolling walker (2 wheeled) Gait Pattern/deviations: Step-through pattern Gait velocity: decreased Gait velocity interpretation: Below normal speed for age/gender General Gait Details: slow steps, but not shuffled.  Cues for posture and to look forward.   Stairs            Wheelchair Mobility    Modified Rankin (Stroke Patients Only)       Balance     Sitting balance-Leahy Scale: Good     Standing balance support: Bilateral upper extremity supported Standing balance-Leahy Scale:  Poor Standing balance comment: still needing exteernal support or RW for safety                            Cognition Arousal/Alertness: Awake/alert Behavior During Therapy: WFL for tasks assessed/performed Overall Cognitive Status: Within Functional Limits for tasks assessed                           Safety/Judgement: Decreased awareness of deficits;Decreased awareness of safety            Exercises General Exercises - Lower Extremity Heel Slides: AROM;Strengthening;Both;10 reps;Supine (graded resistance in gross extention)    General Comments        Pertinent Vitals/Pain Pain Assessment: Faces Faces Pain Scale: Hurts a little bit Pain Location: knees, feet Pain Descriptors / Indicators: Grimacing Pain Intervention(s): Monitored during session    Home Living                      Prior Function            PT Goals (current goals can now be found in the care plan section) Acute Rehab PT Goals Patient Stated Goal: have less pain PT Goal Formulation: With patient Time For Goal Achievement: 10/22/16 Potential to Achieve Goals: Fair Progress towards PT goals: Progressing toward goals    Frequency    Min 3X/week      PT Plan Current plan remains appropriate    Co-evaluation              AM-PAC  PT "6 Clicks" Daily Activity  Outcome Measure  Difficulty turning over in bed (including adjusting bedclothes, sheets and blankets)?: A Little Difficulty moving from lying on back to sitting on the side of the bed? : A Little Difficulty sitting down on and standing up from a chair with arms (e.g., wheelchair, bedside commode, etc,.)?: A Little Help needed moving to and from a bed to chair (including a wheelchair)?: A Little Help needed walking in hospital room?: A Little Help needed climbing 3-5 steps with a railing? : A Lot 6 Click Score: 17    End of Session   Activity Tolerance: Patient tolerated treatment well;Patient  limited by fatigue Patient left: in bed;with call bell/phone within reach;with bed alarm set Nurse Communication: Mobility status PT Visit Diagnosis: Other abnormalities of gait and mobility (R26.89) Pain - part of body:  (bil knees/feet)     Time: 1428-1500 PT Time Calculation (min) (ACUTE ONLY): 32 min  Charges:  $Gait Training: 8-22 mins $Therapeutic Exercise: 8-22 mins                    G Codes:       10-15-2016  Donnella Sham, PT [phone]  (pager)   Brandi Love 10/15/2016, 3:09 PM

## 2016-10-11 NOTE — Progress Notes (Signed)
Patient Brandi Love is clogged and unable to be flushed with sterile water.  Feeding has been stopped.  Notified the MD.  Will continue to monitor the patient

## 2016-10-11 NOTE — Progress Notes (Signed)
Patient refused this AM CBG to be checked and also she refused her 8:00 o'clock medicine. She started to yell at staff to get out of her room. MD made aware.

## 2016-10-11 NOTE — Progress Notes (Addendum)
Patient was discharged to nursing home [facility]) by MD order; discharged instructions  review and sent to facility with care notes and prescription via [facility]; IV DIC; core track in place, patent; multiple unsuccessfully atemps to call facility to give report; patient will be transported to facility via [place] [facility]. Family at bedside.

## 2016-10-11 NOTE — Progress Notes (Signed)
Patient refused her CBG checks throughout the night.  Will continue to monitor the patient and notify as needed

## 2016-10-15 ENCOUNTER — Ambulatory Visit (HOSPITAL_COMMUNITY)
Admission: RE | Admit: 2016-10-15 | Discharge: 2016-10-15 | Disposition: A | Discharge status: Home / Self Care | Payer: Medicare Other | Source: Ambulatory Visit | Attending: Internal Medicine | Admitting: Internal Medicine

## 2016-10-15 ENCOUNTER — Other Ambulatory Visit (HOSPITAL_COMMUNITY): Payer: Self-pay | Admitting: Internal Medicine

## 2016-10-15 DIAGNOSIS — T85598A Other mechanical complication of other gastrointestinal prosthetic devices, implants and grafts, initial encounter: Secondary | ICD-10-CM

## 2016-10-15 DIAGNOSIS — Z431 Encounter for attention to gastrostomy: Secondary | ICD-10-CM | POA: Insufficient documentation

## 2016-10-15 MED ORDER — LIDOCAINE VISCOUS 2 % MT SOLN
OROMUCOSAL | Status: AC
  Filled 2016-10-15: qty 15

## 2016-10-15 MED ORDER — LIDOCAINE VISCOUS 2 % MT SOLN
15.0000 mL | Freq: Once | OROMUCOSAL | Status: AC
  Administered 2016-10-15: 2 mL via OROMUCOSAL

## 2016-10-29 ENCOUNTER — Encounter: Payer: Self-pay | Admitting: *Deleted

## 2016-10-30 ENCOUNTER — Emergency Department (HOSPITAL_COMMUNITY)
Admission: EM | Admit: 2016-10-30 | Discharge: 2016-10-31 | Disposition: A | Discharge status: Home / Self Care | Payer: Medicare Other | Attending: Emergency Medicine | Admitting: Emergency Medicine

## 2016-10-30 ENCOUNTER — Encounter (HOSPITAL_COMMUNITY): Payer: Self-pay

## 2016-10-30 DIAGNOSIS — R1110 Vomiting, unspecified: Secondary | ICD-10-CM | POA: Insufficient documentation

## 2016-10-30 DIAGNOSIS — N183 Chronic kidney disease, stage 3 (moderate): Secondary | ICD-10-CM | POA: Insufficient documentation

## 2016-10-30 DIAGNOSIS — E039 Hypothyroidism, unspecified: Secondary | ICD-10-CM | POA: Diagnosis not present

## 2016-10-30 DIAGNOSIS — Z7982 Long term (current) use of aspirin: Secondary | ICD-10-CM | POA: Diagnosis not present

## 2016-10-30 DIAGNOSIS — F419 Anxiety disorder, unspecified: Secondary | ICD-10-CM | POA: Insufficient documentation

## 2016-10-30 DIAGNOSIS — Y829 Unspecified medical devices associated with adverse incidents: Secondary | ICD-10-CM | POA: Insufficient documentation

## 2016-10-30 DIAGNOSIS — R112 Nausea with vomiting, unspecified: Secondary | ICD-10-CM | POA: Diagnosis present

## 2016-10-30 DIAGNOSIS — R7303 Prediabetes: Secondary | ICD-10-CM | POA: Diagnosis not present

## 2016-10-30 DIAGNOSIS — T85598A Other mechanical complication of other gastrointestinal prosthetic devices, implants and grafts, initial encounter: Secondary | ICD-10-CM | POA: Diagnosis not present

## 2016-10-30 DIAGNOSIS — Z794 Long term (current) use of insulin: Secondary | ICD-10-CM | POA: Insufficient documentation

## 2016-10-30 DIAGNOSIS — F329 Major depressive disorder, single episode, unspecified: Secondary | ICD-10-CM | POA: Insufficient documentation

## 2016-10-30 DIAGNOSIS — I129 Hypertensive chronic kidney disease with stage 1 through stage 4 chronic kidney disease, or unspecified chronic kidney disease: Secondary | ICD-10-CM | POA: Insufficient documentation

## 2016-10-30 DIAGNOSIS — Z79899 Other long term (current) drug therapy: Secondary | ICD-10-CM | POA: Diagnosis not present

## 2016-10-30 LAB — COMPREHENSIVE METABOLIC PANEL
ALT: 12 U/L — ABNORMAL LOW (ref 14–54)
AST: 14 U/L — AB (ref 15–41)
Albumin: 3.5 g/dL (ref 3.5–5.0)
Alkaline Phosphatase: 75 U/L (ref 38–126)
Anion gap: 11 (ref 5–15)
BUN: 36 mg/dL — ABNORMAL HIGH (ref 6–20)
CHLORIDE: 102 mmol/L (ref 101–111)
CO2: 25 mmol/L (ref 22–32)
Calcium: 9.6 mg/dL (ref 8.9–10.3)
Creatinine, Ser: 1.96 mg/dL — ABNORMAL HIGH (ref 0.44–1.00)
GFR, EST AFRICAN AMERICAN: 36 mL/min — AB (ref >60)
GFR, EST NON AFRICAN AMERICAN: 31 mL/min — AB (ref >60)
Glucose, Bld: 126 mg/dL — ABNORMAL HIGH (ref 65–99)
POTASSIUM: 4 mmol/L (ref 3.5–5.1)
SODIUM: 138 mmol/L (ref 135–145)
Total Bilirubin: 0.7 mg/dL (ref 0.3–1.2)
Total Protein: 6.5 g/dL (ref 6.5–8.1)

## 2016-10-30 LAB — CBC WITH DIFFERENTIAL/PLATELET
Basophils Absolute: 0 10*3/uL (ref 0.0–0.1)
Basophils Relative: 0 %
Eosinophils Absolute: 0.2 10*3/uL (ref 0.0–0.7)
Eosinophils Relative: 2 %
HEMATOCRIT: 36.7 % (ref 36.0–46.0)
HEMOGLOBIN: 12.1 g/dL (ref 12.0–15.0)
LYMPHS PCT: 29 %
Lymphs Abs: 2.5 10*3/uL (ref 0.7–4.0)
MCH: 30 pg (ref 26.0–34.0)
MCHC: 33 g/dL (ref 30.0–36.0)
MCV: 90.8 fL (ref 78.0–100.0)
Monocytes Absolute: 0.5 10*3/uL (ref 0.1–1.0)
Monocytes Relative: 6 %
NEUTROS ABS: 5.3 10*3/uL (ref 1.7–7.7)
Neutrophils Relative %: 63 %
Platelets: 303 10*3/uL (ref 150–400)
RBC: 4.04 MIL/uL (ref 3.87–5.11)
RDW: 13 % (ref 11.5–15.5)
WBC: 8.4 10*3/uL (ref 4.0–10.5)

## 2016-10-30 LAB — LIPASE, BLOOD: LIPASE: 36 U/L (ref 11–51)

## 2016-10-30 MED ORDER — LACTATED RINGERS IV BOLUS (SEPSIS): 1000.0000 mL | Freq: Once | INTRAVENOUS | Status: DC

## 2016-10-30 NOTE — ED Notes (Signed)
Pt stated that she is in pain, and that she normally gets a pain pill around this time, RN notified

## 2016-10-30 NOTE — ED Notes (Signed)
Report called to [facility] to [facility] as well as to Camera operator.

## 2016-10-30 NOTE — ED Triage Notes (Signed)
Pt here for NGT out. sts made her self vomit because she got upset and NGT that has been inplace came out on arrival pt NGT is in left nare and out of mouth. Pt with hx of bulemia and atypical anorexia

## 2016-10-30 NOTE — Discharge Instructions (Signed)
We are unable to place a duodenal NG tube in the ER at this time of day. This will need placement as an outpatient. If your providers determine that you need an NG tube they can order this an an outpatient.

## 2016-10-30 NOTE — ED Notes (Signed)
[facility] called for transport 

## 2016-10-30 NOTE — ED Notes (Signed)
Per PA LR bolus now not needed.

## 2016-10-30 NOTE — ED Provider Notes (Signed)
[facility] DEPT Provider Note   CSN: [id number] Arrival date & time: 10/30/16  2051     History   Chief Complaint Chief Complaint  Patient presents with  . Emesis    HPI Brandi Love is a 41 y.o. female.  Brandi Love is a 41 y.o. Female with a history of an eating disorder who presents to the emergency department after her NG tube came out of her mouth. Patient reports that she is at [facility] rehabilitation facility in currently has an NG tube placed. She receives feedings overnight. She has a history of an eating disorder and is supposed to be eating her meals appropriately. She became upset because a nurse was encouraging her to eat more of her food when she placed her finger down her throat causing her to vomit. She reports this caused the NG tube become out of her mouth. This caused her to feel like she was gagging and she continues to throw up some. On arrival to the emergency department the NG tube is coming out of her nose and out of her mouth. This was then removed and patient reports her nausea has resolved. She's had no further vomiting. She does report some pain generally to her abdomen currently.  She denies further vomiting. She denies other complaints. She denies urinary symptoms, hematemesis, fevers, chest pain, shortness of breath or trouble breathing.   The history is provided by the patient and medical records. No language interpreter was used.  Emesis   Associated symptoms include abdominal pain. Pertinent negatives include no chills, no cough, no diarrhea, no fever and no headaches.    Past Medical History:  Diagnosis Date  . Anemia   . Anxiety   . Arthritis    "hands" (09/18/2016)  . Chronic lower back pain   . Chronic renal insufficiency   . Deafness   . Depression   . Diabetes ([place])    "borderline"  . Eating disorder   . Endometriosis   . GERD (gastroesophageal reflux disease)   . History of kidney stones   . Hyperlipidemia   . Hypertension    . 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 Migraine    "frequency varies" (09/18/2016)  . OCD (obsessive compulsive disorder)   . Pneumonia 2011  . PONV (postoperative nausea and vomiting)   . Pulmonary embolism ([place]) 10/2001  . Seizures ([place]) 09/2001 X 1   "day after my hysterectomy"  . Tachycardia     Patient Active Problem List   Diagnosis Date Noted  . Urinary tract infection with hematuria   . OCD (obsessive compulsive disorder) 09/29/2016  . Eating disorder 09/29/2016  . Deafness congenital 09/29/2016  . Intractable nausea and vomiting 09/29/2016  . Bloody diarrhea 09/29/2016  . AKI (acute kidney injury) ([place]) 09/29/2016  . Severe dehydration 09/29/2016  . Severe protein-calorie malnutrition ([place]) 09/29/2016  . Congenital hypothyroidism 09/29/2016  . Hypertriglyceridemia 09/29/2016  . Anemia 09/29/2016  . Endometriosis 09/29/2016  . MDD (major depressive disorder) 09/29/2016  . Hyperlipidemia 09/29/2016  . CKD (chronic kidney disease), stage III 09/29/2016  . Acute kidney injury ([place]) 09/29/2016  . OAB (overactive bladder) 09/29/2016  . Sepsis ([place]) 09/18/2016  . Acute pyelonephritis 09/18/2016  . MDD (major depressive disorder), recurrent, severe, with psychosis ([place]) 12/11/2014  . Eating disorder, unspecified 11/23/2012  . Hypothyroidism 11/23/2012  . Dyslipidemia 11/23/2012  . Sinus tachycardia 11/23/2012  . CKD (chronic kidney disease) stage 3, GFR 30-59 ml/min 11/18/2012  . Protein-calorie  malnutrition, severe ([place]) 11/13/2012  . Abdominal pain, epigastric 11/12/2012  . Dehydration 11/12/2012  . Acute on chronic renal failure ([place]) 11/12/2012  . Leukocytosis, unspecified 11/12/2012  . Hyponatremia 11/12/2012  . Borderline personality disorder 11/12/2012    Past Surgical History:  Procedure Laterality Date  . ABDOMINAL HYSTERECTOMY  09/2001   'except for right ovary'  . BREAST LUMPECTOMY Left 2000   benign  . BREAST LUMPECTOMY Right 2002    benign  . COCHLEAR IMPLANT Bilateral 2004-2010   "right-left"  . COLONOSCOPY  06/2015    Dr Rolm Bookbinder at [facility] for rectal bleeding: internal hemorrhoids, small anal fissure.    . ESOPHAGOGASTRODUODENOSCOPY N/A 10/07/2016   Procedure: ESOPHAGOGASTRODUODENOSCOPY (EGD);  Surgeon: Ladene Artist, MD;  Location: [facility] ENDOSCOPY;  Service: Endoscopy;  Laterality: N/A;  . EYE MUSCLE SURGERY Left 1980   "lazy eye"  . EYE MUSCLE SURGERY Right 1990   "lazy eye"  . LAPAROSCOPIC ABDOMINAL EXPLORATION  2001 and 2002   for endometriosis    OB History    No data available       Home Medications    Prior to Admission medications   Medication Sig Start Date End Date Taking? Authorizing Provider  aspirin EC 81 MG tablet Take 81 mg by mouth at bedtime.     [provider]  atorvastatin (LIPITOR) 20 MG tablet Take 20 mg by mouth daily.    [provider]  feeding supplement (BOOST / RESOURCE BREEZE) LIQD Take 1 Container by mouth 3 (three) times daily between meals. 10/11/16   Elgergawy, Silver Huguenin, MD  feeding supplement (BOOST / RESOURCE BREEZE) LIQD Take 1 Container by mouth 3 (three) times daily between meals. 10/11/16   Elgergawy, Silver Huguenin, MD  haloperidol (HALDOL) 5 MG tablet Take 5 mg by mouth at bedtime.     [provider]  Hydrocodone-Acetaminophen 5-300 MG TABS Take 1 tablet by mouth every 6 (six) hours. 10/11/16   Elgergawy, Silver Huguenin, MD  hydrOXYzine (ATARAX/VISTARIL) 50 MG tablet Take 50 mg by mouth 3 (three) times daily as needed.    [provider]  insulin aspart (NOVOLOG) 100 UNIT/ML injection Inject 0-9 Units into the skin 3 (three) times daily with meals. 10/11/16   Elgergawy, Silver Huguenin, MD  lamoTRIgine (LAMICTAL) 200 MG tablet Take 200 mg by mouth daily.    [provider]  levothyroxine (SYNTHROID, LEVOTHROID) 150 MCG tablet Take 150 mcg by mouth daily.    [provider]  lurasidone (LATUDA) 20 MG TABS tablet Take 20 mg by  mouth daily.     [provider]  Melatonin 10 MG TABS Take 10 mg by mouth at bedtime.    [provider]  Multiple Vitamin (MULTIVITAMIN WITH MINERALS) TABS Take 1 tablet by mouth daily.    [provider]  Nutritional Supplements (FEEDING SUPPLEMENT, OSMOLITE 1.2 CAL,) LIQD Place 1,000 mLs into feeding tube continuous. Patient on nocturnal tube feeds from 7 PM to 7 AM at 40 mL per hour via core track 10/11/16   Elgergawy, Silver Huguenin, MD  ondansetron (ZOFRAN) 4 MG tablet Take 4 mg by mouth every 8 (eight) hours as needed for nausea.    [provider]  pantoprazole (PROTONIX) 40 MG tablet Take 40 mg by mouth daily.    [provider]  promethazine (PHENERGAN) 25 MG tablet Take 1 tablet (25 mg total) by mouth every 6 (six) hours as needed for nausea or vomiting. Patient not taking: Reported on 09/29/2016  06/24/14   Alvina Chou, PA-C  QUEtiapine Fumarate (SEROQUEL XR) 150 MG 24 hr tablet Take 150 mg by mouth at bedtime.    [provider]  topiramate (TOPAMAX) 50 MG tablet Take 50 mg by mouth 2 (two) times daily.     [provider]  venlafaxine XR (EFFEXOR-XR) 150 MG 24 hr capsule Take 150 mg by mouth daily with breakfast.    [provider]  Vitamin D, Ergocalciferol, (DRISDOL) 50000 UNITS CAPS Take 50,000 Units by mouth every 7 (seven) days. On Sunday    [provider]    Family History Family History  Problem Relation Age of Onset  . Cancer Father   . Hyperlipidemia Father   . Osteoarthritis Mother   . Hyperlipidemia Mother     Social History Social History  Substance Use Topics  . Smoking status: Never Smoker  . Smokeless tobacco: Never Used  . Alcohol use No     Allergies   Dhea [nutritional supplements]; Entex lq [phenylephrine-guaifenesin]; Indomethacin; Iodinated diagnostic agents; Melatonin; Propoxyphene; Risperidone and related; Shellfish allergy; Depakote [divalproex sodium]; Abilify  [aripiprazole]; Dihydroergotamine; Doxycycline; Erythromycin; Lactose intolerance (gi); Nsaids; and Tape   Review of Systems Review of Systems  Constitutional: Negative for chills and fever.  HENT: Negative for congestion and sore throat.   Eyes: Negative for visual disturbance.  Respiratory: Negative for cough and shortness of breath.   Cardiovascular: Negative for chest pain.  Gastrointestinal: Positive for abdominal pain, nausea and vomiting. Negative for diarrhea.  Genitourinary: Negative for difficulty urinating and dysuria.  Musculoskeletal: Negative for back pain and neck pain.  Skin: Negative for rash.  Neurological: Negative for headaches.     Physical Exam Updated Vital Signs BP 123/79   Pulse (!) 106   Temp 99.3 F (37.4 C) (Oral)   Resp 18   SpO2 98%   Physical Exam  Constitutional: She appears well-developed and well-nourished. No distress.  Nontoxic appearing.  HENT:  Head: Normocephalic and atraumatic.  Mouth/Throat: Oropharynx is clear and moist.  NG tube coming out of her nose and through her mouth. Causing gagging. This was removed and nausea and vomiting resolved.  Eyes: Conjunctivae are normal. Pupils are equal, round, and reactive to light. Right eye exhibits no discharge. Left eye exhibits no discharge.  Neck: Neck supple.  Cardiovascular: Normal rate, regular rhythm, normal heart sounds and intact distal pulses.  Exam reveals no gallop and no friction rub.   No murmur heard. Pulmonary/Chest: Effort normal and breath sounds normal. No respiratory distress. She has no wheezes. She has no rales.  Abdominal: Soft. Bowel sounds are normal. She exhibits no distension and no mass. There is tenderness. There is no rebound and no guarding.  Abdomen is soft. Bowel sounds are present. Patient has mild generalized abdominal tenderness to palpation without focal tenderness. No peritoneal signs.  Musculoskeletal: She exhibits no edema.  Lymphadenopathy:    She has  no cervical adenopathy.  Neurological: She is alert. Coordination normal.  Skin: Skin is warm and dry. Capillary refill takes less than 2 seconds. No rash noted. She is not diaphoretic. No erythema. No pallor.  Psychiatric: She has a normal mood and affect. Her behavior is normal.  Nursing note and vitals reviewed.    ED Treatments / Results  Labs (all labs ordered are listed, but only abnormal results are displayed) Labs Reviewed  COMPREHENSIVE METABOLIC PANEL - Abnormal; Notable for the following:       Result Value   Glucose, Bld 126 (*)  BUN 36 (*)    Creatinine, Ser 1.96 (*)    AST 14 (*)    ALT 12 (*)    GFR calc non Af Amer 31 (*)    GFR calc Af Amer 36 (*)    All other components within normal limits  LIPASE, BLOOD  CBC WITH DIFFERENTIAL/PLATELET    EKG  EKG Interpretation None       Radiology No results found.  Procedures Procedures (including critical care time)  Medications Ordered in ED Medications  lactated ringers bolus 1,000 mL (not administered)     Initial Impression / Assessment and Plan / ED Course  I have reviewed the triage vital signs and the nursing notes.  Pertinent labs & imaging results that were available during my care of the patient were reviewed by me and considered in my medical decision making (see chart for details).     This is a 41 y.o. Female with a history of an eating disorder who presents to the emergency department after her NG tube came out of her mouth. Patient reports that she is at [facility] rehabilitation facility in currently has an NG tube placed. She receives feedings overnight. She has a history of an eating disorder and is supposed to be eating her meals appropriately. She became upset because a nurse was encouraging her to eat more of her food when she placed her finger down her throat causing her to vomit. She reports this caused the NG tube become out of her mouth. This caused her to feel like she was gagging  and she continues to throw up some. On arrival to the emergency department the NG tube is coming out of her nose and out of her mouth. This was then removed and patient reports her nausea has resolved. She's had no further vomiting.  On arrival to the emergency department the patient has the NG tube coming out of her nose or mouth. This is causing her to gag. This was removed and this resolved her symptoms. On initial evaluation she has some generalized abdominal tenderness to palpation without focal tenderness.  Indications a creatinine at her baseline. Normal lipase. Unremarkable CBC. At reevaluation patient is tolerating by mouth without difficulty. No further vomiting.   It was revealed the patient had a duodenal NG tube that is placed by radiology. We are unable to place this kind of NG tube in the emergency department at this time. This NG tube was placed in order from [facility]. They will need to reorder this if this is required. She is tolerating by mouth at this time. We'll discharge her back to [facility] at this time. Follow-up with primary care. I advised the patient to follow-up with their primary care provider this week. I advised the patient to return to the emergency department with new or worsening symptoms or new concerns. The patient verbalized understanding and agreement with plan.    This patient was discussed with Dr. Lita Mains who agrees with assessment and plan.   Final Clinical Impressions(s) / ED Diagnoses   Final diagnoses:  Feeding tube dysfunction, initial encounter  Non-intractable vomiting, presence of nausea not specified, unspecified vomiting type    New Prescriptions New Prescriptions   No medications on file     Waynetta Pean, Hershal Coria 10/30/16 2346    Julianne Rice, MD 10/31/16 [phone]

## 2016-10-30 NOTE — ED Notes (Signed)
When report called facility made aware that nasoduodenal tube would need to placed as outpatient.

## 2016-10-30 NOTE — ED Notes (Addendum)
Pt gagging on tube from mouth, removed. Septal ring with clamp left in place.

## 2016-10-30 NOTE — ED Notes (Signed)
Attempted to replace NGT with out stock. Pt sts that it should be a yellow tube and refused current NGT. PA made aware and per notes from previous tube it was naso duodenal tube. Will continue to follow up. nad noted.

## 2016-10-31 ENCOUNTER — Encounter: Payer: Medicare Other | Attending: Family Medicine | Admitting: *Deleted

## 2016-10-31 DIAGNOSIS — Z713 Dietary counseling and surveillance: Secondary | ICD-10-CM | POA: Insufficient documentation

## 2016-10-31 DIAGNOSIS — N183 Chronic kidney disease, stage 3 (moderate): Secondary | ICD-10-CM | POA: Diagnosis not present

## 2016-10-31 DIAGNOSIS — E1122 Type 2 diabetes mellitus with diabetic chronic kidney disease: Secondary | ICD-10-CM | POA: Insufficient documentation

## 2016-10-31 DIAGNOSIS — F509 Eating disorder, unspecified: Secondary | ICD-10-CM

## 2016-10-31 DIAGNOSIS — F5000 Anorexia nervosa, unspecified: Secondary | ICD-10-CM | POA: Insufficient documentation

## 2016-11-06 NOTE — Progress Notes (Signed)
  Patient was seen on 10/31/16 for nutrition counseling pertaining to disordered eating    Assessment Visited Brandi Love at [facility].  She was moved from the rehab hall to the assisted living hall due to insurance coverage.  She is very upset about the move. She refused physical therapy 2 days in a row and refused speech therapy.  She has been refusing meal trays.  She is not practicing walking on her own and fell 3 times during the course of her stay.  We are still waiting for her to be placed in an eating disorder facility.   She SIV the night before and threw up her NG tube.  No one was able to replace the tube.  I instructed a new tube should not be placed at this time.  She might eat more without the tube.  If she doesn't eat, she will need a new tube.  At this point, she is not eating or adhering to treatment .    Dietary assessment: A typical day consists of 0 meals and couple snacks   24 hour recall:  Various jello and pudding   Estimated energy intake: Varies.  inadequate  Estimated Energy Needs 2100 kcal/day 70 g fat 100 g protein 265 g carbohydrate 2200 ml fluid  Nutrition Diagnosis: NI-1.4 Inadequate energy intake As related to eating disorder.  As evidenced by dietary recall.  Intervention/Goals: Brandi Love is not doing well.  She has been without therapy for more than 6 weeks.  She has also been without proper nutrition counseling for about the same time.  She is stressed about the ongoing admission process with [facility] eating disorder facility.  Discussed how refusing PT, SLP, walking, and food, and water affects her progress and her living arrangement.  She is not able to go home because she is too weak from inadequate nutrition and inadequate walking practice.  Discussed her goals and what she has the power to change.  She drank a shake while I was there and agreed to eat more and practice walking some      Monitoring and Evaluation: Patient will follow up 1 in  week.

## 2016-11-07 ENCOUNTER — Encounter: Payer: Medicare Other | Admitting: *Deleted

## 2016-11-07 DIAGNOSIS — Z713 Dietary counseling and surveillance: Secondary | ICD-10-CM | POA: Diagnosis not present

## 2016-11-07 DIAGNOSIS — F509 Eating disorder, unspecified: Secondary | ICD-10-CM

## 2016-11-07 DIAGNOSIS — E860 Dehydration: Secondary | ICD-10-CM

## 2016-11-07 NOTE — Progress Notes (Signed)
  Patient was seen on 11/07/16 for nutrition counseling pertaining to disordered eating    Assessment Visited Brandi Love at [facility].   She is eating a little more and supplementing sometimes.  Inadequate calorie intake and inadequate fluids still.  Is not practicing her walking and is at high risk for falls     Estimated energy intake: Varies.  inadequate  Estimated Energy Needs 2100 kcal/day 70 g fat 100 g protein 265 g carbohydrate 2200 ml fluid  Nutrition Diagnosis: NI-1.4 Inadequate energy intake As related to eating disorder.  As evidenced by dietary recall.  Intervention/Goals: [facility] application was neglected by the facility.  I am waiting to hear back from them. Discussed how refusing walking, and food, and water affects her progress and her living arrangement.  She is not able to go home because she is too weak from inadequate nutrition and inadequate walking practice.  Discussed her goals and what she has the power to change.  She agreed to eat more and practice walking some   Monitoring and Evaluation: Patient will follow up prn

## 2016-11-28 ENCOUNTER — Encounter: Payer: Medicare Other | Attending: Family Medicine | Admitting: *Deleted

## 2016-11-28 DIAGNOSIS — Z713 Dietary counseling and surveillance: Secondary | ICD-10-CM | POA: Insufficient documentation

## 2016-11-28 DIAGNOSIS — N183 Chronic kidney disease, stage 3 (moderate): Secondary | ICD-10-CM | POA: Insufficient documentation

## 2016-11-28 DIAGNOSIS — F509 Eating disorder, unspecified: Secondary | ICD-10-CM

## 2016-11-28 DIAGNOSIS — F5000 Anorexia nervosa, unspecified: Secondary | ICD-10-CM | POA: Insufficient documentation

## 2016-11-28 DIAGNOSIS — E1122 Type 2 diabetes mellitus with diabetic chronic kidney disease: Secondary | ICD-10-CM | POA: Diagnosis not present

## 2016-11-29 NOTE — Progress Notes (Signed)
Visited patient at [facility].  She has been eating and drinking well the past 3 days.  Prior to that she was skipping meals and restricting at meals.  She has been experiencing chest pain and dizziness.    She has been walking more and is now mobile. The single case agreement with [facility] has fallen through.  I requested Medicaid to find an alternative residential treatment site for eating disorders.  We will also try to work on [employer], but she doesn't have access to a computer in the nursing home.  Spoke with her mother afterwards and they would like a plan.  Brandi Love has 1 month to follow recommendations while in the nursing home.  If she is strong and stable enough, she can go home for outpatient treatment.  If she is not, then she will need to go to an assisted living facility and lose her appartment   Needs 3 meals/day at 75% completion and 2-3 bottles of water, plus 3 cups at meals

## 2016-11-30 ENCOUNTER — Emergency Department (HOSPITAL_COMMUNITY): Payer: Medicare Other

## 2016-11-30 ENCOUNTER — Emergency Department (HOSPITAL_COMMUNITY)
Admission: EM | Admit: 2016-11-30 | Discharge: 2016-12-01 | Disposition: A | Discharge status: Home / Self Care | Payer: Medicare Other | Attending: Emergency Medicine | Admitting: Emergency Medicine

## 2016-11-30 ENCOUNTER — Encounter (HOSPITAL_COMMUNITY): Payer: Self-pay | Admitting: Emergency Medicine

## 2016-11-30 DIAGNOSIS — E1122 Type 2 diabetes mellitus with diabetic chronic kidney disease: Secondary | ICD-10-CM | POA: Insufficient documentation

## 2016-11-30 DIAGNOSIS — Z7982 Long term (current) use of aspirin: Secondary | ICD-10-CM | POA: Diagnosis not present

## 2016-11-30 DIAGNOSIS — E039 Hypothyroidism, unspecified: Secondary | ICD-10-CM | POA: Diagnosis not present

## 2016-11-30 DIAGNOSIS — I129 Hypertensive chronic kidney disease with stage 1 through stage 4 chronic kidney disease, or unspecified chronic kidney disease: Secondary | ICD-10-CM | POA: Diagnosis not present

## 2016-11-30 DIAGNOSIS — N183 Chronic kidney disease, stage 3 (moderate): Secondary | ICD-10-CM | POA: Diagnosis not present

## 2016-11-30 DIAGNOSIS — R079 Chest pain, unspecified: Secondary | ICD-10-CM | POA: Diagnosis present

## 2016-11-30 DIAGNOSIS — Z79899 Other long term (current) drug therapy: Secondary | ICD-10-CM | POA: Diagnosis not present

## 2016-11-30 DIAGNOSIS — Z794 Long term (current) use of insulin: Secondary | ICD-10-CM | POA: Insufficient documentation

## 2016-11-30 DIAGNOSIS — R0789 Other chest pain: Secondary | ICD-10-CM | POA: Insufficient documentation

## 2016-11-30 LAB — CBC
HCT: 35.7 % — ABNORMAL LOW (ref 36.0–46.0)
Hemoglobin: 12.4 g/dL (ref 12.0–15.0)
MCH: 30.2 pg (ref 26.0–34.0)
MCHC: 34.7 g/dL (ref 30.0–36.0)
MCV: 86.9 fL (ref 78.0–100.0)
Platelets: 306 K/uL (ref 150–400)
RBC: 4.11 MIL/uL (ref 3.87–5.11)
RDW: 12.6 % (ref 11.5–15.5)
WBC: 10.8 K/uL — ABNORMAL HIGH (ref 4.0–10.5)

## 2016-11-30 LAB — I-STAT TROPONIN, ED: Troponin i, poc: 0.02 ng/mL (ref 0.00–0.08)

## 2016-11-30 LAB — BASIC METABOLIC PANEL
ANION GAP: 13 (ref 5–15)
BUN: 36 mg/dL — ABNORMAL HIGH (ref 6–20)
CHLORIDE: 102 mmol/L (ref 101–111)
CO2: 18 mmol/L — ABNORMAL LOW (ref 22–32)
Calcium: 9.7 mg/dL (ref 8.9–10.3)
Creatinine, Ser: 1.89 mg/dL — ABNORMAL HIGH (ref 0.44–1.00)
GFR calc non Af Amer: 33 mL/min — ABNORMAL LOW (ref >60)
GFR, EST AFRICAN AMERICAN: 38 mL/min — AB (ref >60)
Glucose, Bld: 127 mg/dL — ABNORMAL HIGH (ref 65–99)
POTASSIUM: 4 mmol/L (ref 3.5–5.1)
SODIUM: 133 mmol/L — AB (ref 135–145)

## 2016-11-30 MED ORDER — FAMOTIDINE 20 MG PO TABS
20.0000 mg | ORAL_TABLET | Freq: Once | ORAL | Status: AC
  Administered 2016-11-30: 20 mg via ORAL
  Filled 2016-11-30: qty 1

## 2016-11-30 MED ORDER — ACETAMINOPHEN 325 MG PO TABS
650.0000 mg | ORAL_TABLET | Freq: Once | ORAL | Status: AC
  Administered 2016-11-30: 650 mg via ORAL
  Filled 2016-11-30: qty 2

## 2016-11-30 MED ORDER — SODIUM CHLORIDE 0.9 % IV BOLUS (SEPSIS)
1000.0000 mL | Freq: Once | INTRAVENOUS | Status: AC
  Administered 2016-11-30: 1000 mL via INTRAVENOUS

## 2016-11-30 MED ORDER — HYDROXYZINE HCL 25 MG PO TABS
25.0000 mg | ORAL_TABLET | Freq: Once | ORAL | Status: AC
  Administered 2016-11-30: 25 mg via ORAL
  Filled 2016-11-30: qty 1

## 2016-11-30 NOTE — ED Triage Notes (Signed)
Pt arrives for chest pain onset shortly after eating cheese pizza. States radiating to bilateral arms and neck. Unresolved after 2 SL nitro, 1 norco, and 324 MG aspirin. IV established in field.

## 2016-11-30 NOTE — ED Provider Notes (Signed)
[facility] DEPT Provider Note   CSN: [id number] Arrival date & time: 11/30/16  2138     History   Chief Complaint Chief Complaint  Patient presents with  . Chest Pain    HPI Brandi Love is a 41 y.o. female.  Patient c/o pain to chest in past day. Started after eating pizza. Pain constant, dull, moderate, worse w palpation. Denies heartburn. No nausea or vomiting. No diaphoresis. No sob. No other recent cp, no exertional cp. No pleuritic pain. No leg pain or swelling. Denies cough or uri c/o. No fever or chills. No chest wall injury/strain.    The history is provided by the patient.  Chest Pain   Pertinent negatives include no abdominal pain, no back pain, no cough, no fever, no headaches and no shortness of breath.    Past Medical History:  Diagnosis Date  . Anemia   . Anxiety   . Arthritis    "hands" (09/18/2016)  . Chronic lower back pain   . Chronic renal insufficiency   . Deafness   . Depression   . Diabetes ([place])    "borderline"  . Eating disorder   . Endometriosis   . GERD (gastroesophageal reflux disease)   . History of kidney stones   . Hyperlipidemia   . Hypertension   . 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 Migraine    "frequency varies" (09/18/2016)  . OCD (obsessive compulsive disorder)   . Pneumonia 2011  . PONV (postoperative nausea and vomiting)   . Pulmonary embolism ([place]) 10/2001  . Seizures ([place]) 09/2001 X 1   "day after my hysterectomy"  . Tachycardia     Patient Active Problem List   Diagnosis Date Noted  . Urinary tract infection with hematuria   . OCD (obsessive compulsive disorder) 09/29/2016  . Eating disorder 09/29/2016  . Deafness congenital 09/29/2016  . Intractable nausea and vomiting 09/29/2016  . Bloody diarrhea 09/29/2016  . AKI (acute kidney injury) ([place]) 09/29/2016  . Severe dehydration 09/29/2016  . Severe protein-calorie malnutrition ([place]) 09/29/2016  . Congenital hypothyroidism 09/29/2016  .  Hypertriglyceridemia 09/29/2016  . Anemia 09/29/2016  . Endometriosis 09/29/2016  . MDD (major depressive disorder) 09/29/2016  . Hyperlipidemia 09/29/2016  . CKD (chronic kidney disease), stage III 09/29/2016  . Acute kidney injury ([place]) 09/29/2016  . OAB (overactive bladder) 09/29/2016  . Sepsis ([place]) 09/18/2016  . Acute pyelonephritis 09/18/2016  . MDD (major depressive disorder), recurrent, severe, with psychosis ([place]) 12/11/2014  . Eating disorder, unspecified 11/23/2012  . Hypothyroidism 11/23/2012  . Dyslipidemia 11/23/2012  . Sinus tachycardia 11/23/2012  . CKD (chronic kidney disease) stage 3, GFR 30-59 ml/min 11/18/2012  . Protein-calorie malnutrition, severe ([place]) 11/13/2012  . Abdominal pain, epigastric 11/12/2012  . Dehydration 11/12/2012  . Acute on chronic renal failure ([place]) 11/12/2012  . Leukocytosis, unspecified 11/12/2012  . Hyponatremia 11/12/2012  . Borderline personality disorder 11/12/2012    Past Surgical History:  Procedure Laterality Date  . ABDOMINAL HYSTERECTOMY  09/2001   'except for right ovary'  . BREAST LUMPECTOMY Left 2000   benign  . BREAST LUMPECTOMY Right 2002   benign  . COCHLEAR IMPLANT Bilateral 2004-2010   "right-left"  . COLONOSCOPY  06/2015    Dr Rolm Bookbinder at [facility] for rectal bleeding: internal hemorrhoids, small anal fissure.    . ESOPHAGOGASTRODUODENOSCOPY N/A 10/07/2016   Procedure: ESOPHAGOGASTRODUODENOSCOPY (EGD);  Surgeon: Ladene Artist, MD;  Location: [facility] ENDOSCOPY;  Service: Endoscopy;  Laterality: N/A;  .  EYE MUSCLE SURGERY Left 1980   "lazy eye"  . EYE MUSCLE SURGERY Right 1990   "lazy eye"  . LAPAROSCOPIC ABDOMINAL EXPLORATION  2001 and 2002   for endometriosis    OB History    No data available       Home Medications    Prior to Admission medications   Medication Sig Start Date End Date Taking? Authorizing Provider  aspirin EC 81 MG tablet Take 81 mg by mouth at bedtime.     [provider]  atorvastatin (LIPITOR) 20 MG tablet Take 20 mg by mouth daily.    [provider]  feeding supplement (BOOST / RESOURCE BREEZE) LIQD Take 1 Container by mouth 3 (three) times daily between meals. 10/11/16   Elgergawy, Silver Huguenin, MD  feeding supplement (BOOST / RESOURCE BREEZE) LIQD Take 1 Container by mouth 3 (three) times daily between meals. 10/11/16   Elgergawy, Silver Huguenin, MD  haloperidol (HALDOL) 5 MG tablet Take 5 mg by mouth at bedtime.     [provider]  Hydrocodone-Acetaminophen 5-300 MG TABS Take 1 tablet by mouth every 6 (six) hours. 10/11/16   Elgergawy, Silver Huguenin, MD  hydrOXYzine (ATARAX/VISTARIL) 50 MG tablet Take 50 mg by mouth 3 (three) times daily as needed.    [provider]  insulin aspart (NOVOLOG) 100 UNIT/ML injection Inject 0-9 Units into the skin 3 (three) times daily with meals. 10/11/16   Elgergawy, Silver Huguenin, MD  lamoTRIgine (LAMICTAL) 200 MG tablet Take 200 mg by mouth daily.    [provider]  levothyroxine (SYNTHROID, LEVOTHROID) 150 MCG tablet Take 150 mcg by mouth daily.    [provider]  lurasidone (LATUDA) 20 MG TABS tablet Take 20 mg by mouth daily.     [provider]  Melatonin 10 MG TABS Take 10 mg by mouth at bedtime.    [provider]  Multiple Vitamin (MULTIVITAMIN WITH MINERALS) TABS Take 1 tablet by mouth daily.    [provider]  Nutritional Supplements (FEEDING SUPPLEMENT, OSMOLITE 1.2 CAL,) LIQD Place 1,000 mLs into feeding tube continuous. Patient on nocturnal tube feeds from 7 PM to 7 AM at 40 mL per hour via core track 10/11/16   Elgergawy, Silver Huguenin, MD  ondansetron (ZOFRAN) 4 MG tablet Take 4 mg by mouth every 8 (eight) hours as needed for nausea.    [provider]  pantoprazole (PROTONIX) 40 MG tablet Take 40 mg by mouth daily.    [provider]  promethazine (PHENERGAN) 25 MG tablet Take 1 tablet (25 mg total) by mouth every 6 (six) hours as needed for  nausea or vomiting. Patient not taking: Reported on 09/29/2016 06/24/14   Alvina Chou, PA-C  QUEtiapine Fumarate (SEROQUEL XR) 150 MG 24 hr tablet Take 150 mg by mouth at bedtime.    [provider]  topiramate (TOPAMAX) 50 MG tablet Take 50 mg by mouth 2 (two) times daily.     [provider]  venlafaxine XR (EFFEXOR-XR) 150 MG 24 hr capsule Take 150 mg by mouth daily with breakfast.    [provider]  Vitamin D, Ergocalciferol, (DRISDOL) 50000 UNITS CAPS Take 50,000 Units by mouth every 7 (seven) days. On Sunday    [provider]    Family History Family History  Problem Relation Age of Onset  . Cancer Father   . Hyperlipidemia Father   . Osteoarthritis Mother   . Hyperlipidemia Mother     Social History Social History  Substance Use Topics  . Smoking status: Never Smoker  . Smokeless tobacco: Never Used  . Alcohol use No     Allergies   Dhea [nutritional supplements]; Entex lq [phenylephrine-guaifenesin]; Indomethacin; Iodinated diagnostic agents; Melatonin; Propoxyphene; Risperidone and related; Shellfish allergy; Depakote [divalproex sodium]; Abilify [aripiprazole]; Dihydroergotamine; Doxycycline; Erythromycin; Lactose intolerance (gi); Nsaids; and Tape   Review of Systems Review of Systems  Constitutional: Negative for fever.  HENT: Negative for sore throat and trouble swallowing.   Eyes: Negative for redness.  Respiratory: Negative for cough and shortness of breath.   Cardiovascular: Positive for chest pain. Negative for leg swelling.  Gastrointestinal: Negative for abdominal pain.  Genitourinary: Negative for flank pain.  Musculoskeletal: Negative for back pain and neck pain.  Skin: Negative for rash.  Neurological: Negative for headaches.  Hematological: Does not bruise/bleed easily.  Psychiatric/Behavioral: Negative for confusion.     Physical Exam Updated Vital Signs BP 115/85 (BP Location: Right Arm)   Pulse (!)  110   Temp 99.4 F (37.4 C) (Oral)   Resp 15   Ht 1.727 m (5\' 8" )   Wt 71 kg (156 lb 8 oz)   SpO2 98%   BMI 23.80 kg/m   Physical Exam  Constitutional: She appears well-developed and well-nourished. No distress.  HENT:  Mouth/Throat: Oropharynx is clear and moist.  Eyes: Conjunctivae are normal. No scleral icterus.  Neck: Neck supple. No tracheal deviation present.  Cardiovascular: Normal rate, regular rhythm, normal heart sounds and intact distal pulses.  Exam reveals no gallop and no friction rub.   No murmur heard. Pulmonary/Chest: Effort normal and breath sounds normal. No respiratory distress. She exhibits tenderness.  Abdominal: Soft. Normal appearance. She exhibits no distension. There is no tenderness.  Genitourinary:  Genitourinary Comments: No cva tenderness  Musculoskeletal: She exhibits no edema or tenderness.  Neurological: She is alert.  Skin: Skin is warm and dry. No rash noted. She is not diaphoretic.  Psychiatric:  Anxious.   Nursing note and vitals reviewed.    ED Treatments / Results  Labs (all labs ordered are listed, but only abnormal results are displayed) Results for orders placed or performed during the hospital encounter of 22/02/54  Basic metabolic panel  Result Value Ref Range   Sodium 133 (L) 135 - 145 mmol/L   Potassium 4.0 3.5 - 5.1 mmol/L   Chloride 102 101 - 111 mmol/L   CO2 18 (L) 22 - 32 mmol/L   Glucose, Bld 127 (H) 65 - 99 mg/dL   BUN 36 (H) 6 - 20 mg/dL   Creatinine, Ser 1.89 (H) 0.44 - 1.00 mg/dL   Calcium 9.7 8.9 - 10.3 mg/dL   GFR calc non Af Amer 33 (L) >60 mL/min   GFR calc Af Amer 38 (L) >60 mL/min   Anion gap 13 5 - 15  CBC  Result Value Ref Range   WBC 10.8 (H) 4.0 - 10.5 K/uL   RBC 4.11 3.87 - 5.11 MIL/uL   Hemoglobin 12.4 12.0 - 15.0 g/dL   HCT 35.7 (L) 36.0 - 46.0 %   MCV 86.9 78.0 - 100.0 fL   MCH 30.2 26.0 - 34.0 pg   MCHC 34.7 30.0 - 36.0 g/dL   RDW 12.6 11.5 - 15.5 %   Platelets 306 150 - 400 K/uL  I-stat  troponin, ED  Result Value Ref Range   Troponin i, poc 0.02 0.00 - 0.08 ng/mL   Comment 3           EKG  EKG Interpretation  Date/Time:  Saturday November 30 2016 21:38:46 EDT Ventricular Rate:  110 PR Interval:    QRS Duration: 87 QT Interval:  326 QTC Calculation: 441 R Axis:   28 Text Interpretation:  Sinus tachycardia Nonspecific T wave abnormality No significant change since last tracing Confirmed by Lajean Saver [phone]) on 11/30/2016 9:45:58 PM       Radiology No results found.  Procedures Procedures (including critical care time)  Medications Ordered in ED Medications  acetaminophen (TYLENOL) tablet 650 mg (not administered)     Initial Impression / Assessment and Plan / ED Course  I have reviewed the triage vital signs and the nursing notes.  Pertinent labs & imaging results that were available during my care of the patient were reviewed by me and considered in my medical decision making (see chart for details).  Labs sent.   Reviewed nursing notes and prior charts for additional history.   +chest wall tenderness. Acetaminophen po.    Final Clinical Impressions(s) / ED Diagnoses   Final diagnoses:  None    New Prescriptions New Prescriptions   No medications on file     Lajean Saver, MD 11/30/16 2310

## 2016-11-30 NOTE — ED Notes (Signed)
Portable xray at bedside.

## 2016-11-30 NOTE — Discharge Instructions (Signed)
It was our pleasure to provide your ER care today - we hope that you feel better.  Take acetaminophen as need.  Follow up with primary care doctor in the coming week.  Return to ER if worse, new symptoms, trouble breathing, other concern.

## 2016-12-12 ENCOUNTER — Telehealth: Payer: Self-pay | Admitting: *Deleted

## 2016-12-12 NOTE — Telephone Encounter (Signed)
Spoke with Bless's nurse to ask about her declining medical state (chest pains, abnormal EKG, weakness, weight loss) and the nurse said she didn't know anything about her case and didn't know who to ask. She would try to find someone and call me back

## 2016-12-17 ENCOUNTER — Encounter: Payer: Medicare Other | Admitting: *Deleted

## 2016-12-17 DIAGNOSIS — Z713 Dietary counseling and surveillance: Secondary | ICD-10-CM | POA: Diagnosis not present

## 2016-12-17 DIAGNOSIS — F509 Eating disorder, unspecified: Secondary | ICD-10-CM

## 2016-12-17 NOTE — Progress Notes (Signed)
Visited Brandi Love in [place].  Brandi Love continues to deteriorate.  Brandi Love has been refusing her meal trays and is also refusing supplements.  Brandi Love can't walk without assistance, is experiencing increased dizziness, SOB, and chest pains.  Brandi Love is increasingly confused and disoriented.    Per communication with representatives from [facility], they are trying to place her in an eating disorder facility out of state since the deal with [facility] fell through.  Brandi Love has been waiting for treatment since her hospital discharge on 6/22.  Since Brandi Love continues to go without appropriate eating disorder care, Brandi Love is getting worse  Attempted to challenge her eating disorder, but Brandi Love was so confused and upset from starvation, not much progress was made.  I stopped by the social worker's office, Brandi Love, at [employer] and expressed my concern.  Asked for meal supervision and a plan to be readmitted to the hospital.  [name] agreed to speak with nursing staff.     [name] reports getting insulin injections up to 5 times/day.  Brandi Love is showing symptoms that could be hypoglycemia.  Brandi Love's not used to insulin injections and Brandi Love's not eating.  Gave more conservative instructions on when Brandi Love should be getting insulin

## 2016-12-19 ENCOUNTER — Ambulatory Visit: Payer: Self-pay | Admitting: *Deleted

## 2016-12-19 ENCOUNTER — Telehealth: Payer: Self-pay | Admitting: *Deleted

## 2016-12-19 NOTE — Telephone Encounter (Signed)
Social worker at [facility].  She will start looking for an assisted living facility to transfer M[place]lin to and will have the staff PA call me to discuss hospitalization/feeding tube placement.  She also requested medical records for Medicaid review.  I will fax over

## 2016-12-19 NOTE — Telephone Encounter (Signed)
Spoke with both parents about Meisha's long term care needs.  [facility] is not sustainable.  She is not medical safe to go home to her apartment and eating disorder facility placement is moving very slowly.  Parents and I agree she should be transferred to assisted living facility if possible. I will communicate with [facility] about this

## 2016-12-26 ENCOUNTER — Emergency Department (HOSPITAL_COMMUNITY)
Admission: EM | Admit: 2016-12-26 | Discharge: 2016-12-26 | Disposition: A | Discharge status: Home / Self Care | Payer: Medicare Other | Attending: Emergency Medicine | Admitting: Emergency Medicine

## 2016-12-26 ENCOUNTER — Telehealth: Payer: Self-pay | Admitting: *Deleted

## 2016-12-26 ENCOUNTER — Encounter: Payer: Medicare Other | Attending: Family Medicine | Admitting: *Deleted

## 2016-12-26 ENCOUNTER — Encounter (HOSPITAL_COMMUNITY): Payer: Self-pay | Admitting: Nurse Practitioner

## 2016-12-26 DIAGNOSIS — F5000 Anorexia nervosa, unspecified: Secondary | ICD-10-CM | POA: Insufficient documentation

## 2016-12-26 DIAGNOSIS — R55 Syncope and collapse: Secondary | ICD-10-CM | POA: Diagnosis present

## 2016-12-26 DIAGNOSIS — E039 Hypothyroidism, unspecified: Secondary | ICD-10-CM | POA: Insufficient documentation

## 2016-12-26 DIAGNOSIS — N183 Chronic kidney disease, stage 3 (moderate): Secondary | ICD-10-CM | POA: Insufficient documentation

## 2016-12-26 DIAGNOSIS — Z713 Dietary counseling and surveillance: Secondary | ICD-10-CM | POA: Diagnosis not present

## 2016-12-26 DIAGNOSIS — E1122 Type 2 diabetes mellitus with diabetic chronic kidney disease: Secondary | ICD-10-CM | POA: Insufficient documentation

## 2016-12-26 DIAGNOSIS — Z79899 Other long term (current) drug therapy: Secondary | ICD-10-CM | POA: Insufficient documentation

## 2016-12-26 DIAGNOSIS — Z7982 Long term (current) use of aspirin: Secondary | ICD-10-CM | POA: Insufficient documentation

## 2016-12-26 DIAGNOSIS — F509 Eating disorder, unspecified: Secondary | ICD-10-CM

## 2016-12-26 LAB — CBG MONITORING, ED: Glucose-Capillary: 138 mg/dL — ABNORMAL HIGH (ref 65–99)

## 2016-12-26 LAB — COMPREHENSIVE METABOLIC PANEL
ALBUMIN: 3.7 g/dL (ref 3.5–5.0)
ALK PHOS: 87 U/L (ref 38–126)
ALT: 17 U/L (ref 14–54)
AST: 16 U/L (ref 15–41)
Anion gap: 10 (ref 5–15)
BILIRUBIN TOTAL: 0.8 mg/dL (ref 0.3–1.2)
BUN: 28 mg/dL — AB (ref 6–20)
CO2: 25 mmol/L (ref 22–32)
CREATININE: 1.92 mg/dL — AB (ref 0.44–1.00)
Calcium: 10 mg/dL (ref 8.9–10.3)
Chloride: 101 mmol/L (ref 101–111)
GFR calc Af Amer: 37 mL/min — ABNORMAL LOW (ref >60)
GFR calc non Af Amer: 32 mL/min — ABNORMAL LOW (ref >60)
GLUCOSE: 124 mg/dL — AB (ref 65–99)
Potassium: 4.2 mmol/L (ref 3.5–5.1)
Sodium: 136 mmol/L (ref 135–145)
TOTAL PROTEIN: 6.9 g/dL (ref 6.5–8.1)

## 2016-12-26 LAB — CBC
HEMATOCRIT: 37.8 % (ref 36.0–46.0)
HEMOGLOBIN: 13.3 g/dL (ref 12.0–15.0)
MCH: 30 pg (ref 26.0–34.0)
MCHC: 35.2 g/dL (ref 30.0–36.0)
MCV: 85.1 fL (ref 78.0–100.0)
Platelets: 267 10*3/uL (ref 150–400)
RBC: 4.44 MIL/uL (ref 3.87–5.11)
RDW: 12.5 % (ref 11.5–15.5)
WBC: 8.5 10*3/uL (ref 4.0–10.5)

## 2016-12-26 NOTE — ED Triage Notes (Signed)
Patient comes from [place] has behavioral issues esp bad when told she will be discharged. Patient became lethargic when told it was time to leave they called EMS to have her brought to ER. No syncope. No pain.

## 2016-12-26 NOTE — ED Provider Notes (Signed)
[facility] DEPT Provider Note   CSN: [id number] Arrival date & time: 12/26/16  1235  History   Chief Complaint No chief complaint on file.   HPI Brandi Love is a 41 y.o. female.  Patient has PMH of CKD, congenital hypothyroidism, HTN, HLD and eating disorder. She was brought to the ED by EMS. The patient is not clear why she was brought from her facility. She states that she cannot remember why she was brought from the facility, but she states that she had an unwitnessed fall. She doesn't remember falling, but states that she remembers waking up on the floor earlier today with the facility social worker standing over her. She states that right before this happened she was going to the bathroom and attempted to stand up to walk away from the bathroom using her walker when she thinks she fell. She remembers feeling "shaky and weak" when she stood up from toilet. She denied loss of continence, hitting her head, confusion after the event, fevers, chills, vomiting, chest pain, and shortness of breath. She states that she feels better now, but no endorses non-specific abdominal pain and nausea. She cannot describe her pain, or alleviating and aggrevating factors. She states that she is constipated and has not had a bowel movement in a few days.     Past Medical History:  Diagnosis Date  . Anemia   . Anxiety   . Arthritis    "hands" (09/18/2016)  . Chronic lower back pain   . Chronic renal insufficiency   . Deafness   . Depression   . Diabetes ([place])    "borderline"  . Eating disorder   . Endometriosis   . GERD (gastroesophageal reflux disease)   . History of kidney stones   . Hyperlipidemia   . Hypertension   . 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 Migraine    "frequency varies" (09/18/2016)  . OCD (obsessive compulsive disorder)   . Pneumonia 2011  . PONV (postoperative nausea and vomiting)   . Pulmonary embolism ([place]) 10/2001  . Seizures ([place]) 09/2001 X 1   "day after my hysterectomy"  . Tachycardia     Patient Active Problem List   Diagnosis Date Noted  . Urinary tract infection with hematuria   . OCD (obsessive compulsive disorder) 09/29/2016  . Eating disorder 09/29/2016  . Deafness congenital 09/29/2016  . Intractable nausea and vomiting 09/29/2016  . Bloody diarrhea 09/29/2016  . AKI (acute kidney injury) ([place]) 09/29/2016  . Severe dehydration 09/29/2016  . Severe protein-calorie malnutrition ([place]) 09/29/2016  . Congenital hypothyroidism 09/29/2016  . Hypertriglyceridemia 09/29/2016  . Anemia 09/29/2016  . Endometriosis 09/29/2016  . MDD (major depressive disorder) 09/29/2016  . Hyperlipidemia 09/29/2016  . CKD (chronic kidney disease), stage III 09/29/2016  . Acute kidney injury ([place]) 09/29/2016  . OAB (overactive bladder) 09/29/2016  . Sepsis ([place]) 09/18/2016  . Acute pyelonephritis 09/18/2016  . MDD (major depressive disorder), recurrent, severe, with psychosis ([place]) 12/11/2014  . Eating disorder, unspecified 11/23/2012  . Hypothyroidism 11/23/2012  . Dyslipidemia 11/23/2012  . Sinus tachycardia 11/23/2012  . CKD (chronic kidney disease) stage 3, GFR 30-59 ml/min 11/18/2012  . Protein-calorie malnutrition, severe ([place]) 11/13/2012  . Abdominal pain, epigastric 11/12/2012  . Dehydration 11/12/2012  . Acute on chronic renal failure ([place]) 11/12/2012  . Leukocytosis, unspecified 11/12/2012  . Hyponatremia 11/12/2012  . Borderline personality disorder 11/12/2012    Past Surgical History:  Procedure Laterality Date  . ABDOMINAL HYSTERECTOMY  09/2001   '  except for right ovary'  . BREAST LUMPECTOMY Left 2000   benign  . BREAST LUMPECTOMY Right 2002   benign  . COCHLEAR IMPLANT Bilateral 2004-2010   "right-left"  . COLONOSCOPY  06/2015    Dr Rolm Bookbinder at [facility] for rectal bleeding: internal hemorrhoids, small anal fissure.    . ESOPHAGOGASTRODUODENOSCOPY N/A 10/07/2016   Procedure: ESOPHAGOGASTRODUODENOSCOPY  (EGD);  Surgeon: Ladene Artist, MD;  Location: [facility] ENDOSCOPY;  Service: Endoscopy;  Laterality: N/A;  . EYE MUSCLE SURGERY Left 1980   "lazy eye"  . EYE MUSCLE SURGERY Right 1990   "lazy eye"  . LAPAROSCOPIC ABDOMINAL EXPLORATION  2001 and 2002   for endometriosis    OB History    No data available      Home Medications    Prior to Admission medications   Medication Sig Start Date End Date Taking? Authorizing Provider  aspirin 81 MG chewable tablet Chew by mouth daily.   Yes [provider]  atorvastatin (LIPITOR) 20 MG tablet Take 20 mg by mouth daily.   Yes [provider]  docusate sodium (COLACE) 100 MG capsule Take 100 mg by mouth 2 (two) times daily.   Yes [provider]  haloperidol (HALDOL) 5 MG tablet Take 5 mg by mouth at bedtime.    Yes [provider]  HYDROcodone-acetaminophen (NORCO/VICODIN) 5-325 MG tablet Take 1 tablet by mouth every 6 (six) hours as needed for moderate pain.   Yes [provider]  hydrOXYzine (ATARAX/VISTARIL) 50 MG tablet Take 50 mg by mouth daily as needed for itching.    Yes [provider]  lamoTRIgine (LAMICTAL) 200 MG tablet Take 200 mg by mouth daily.   Yes [provider]  levothyroxine (SYNTHROID, LEVOTHROID) 150 MCG tablet Take 150 mcg by mouth daily.   Yes [provider]  LORazepam (ATIVAN) 0.5 MG tablet Take 0.5 mg by mouth 2 (two) times daily as needed for anxiety.   Yes [provider]  lurasidone (LATUDA) 20 MG TABS tablet Take 20 mg by mouth daily.    Yes [provider]  Melatonin 3 MG TABS Take 3 mg by mouth at bedtime.    Yes [provider]  Multiple Vitamin (MULTIVITAMIN WITH MINERALS) TABS Take 1 tablet by mouth daily.   Yes [provider]  ondansetron (ZOFRAN) 4 MG tablet Take 4 mg by mouth every 8 (eight) hours as needed for nausea.   Yes [provider]  pantoprazole (PROTONIX) 40 MG tablet Take 40 mg by  mouth daily.   Yes [provider]  polyethylene glycol (MIRALAX / GLYCOLAX) packet Take 17 g by mouth daily as needed for mild constipation.   Yes [provider]  promethazine (PHENERGAN) 25 MG tablet Take 1 tablet (25 mg total) by mouth every 6 (six) hours as needed for nausea or vomiting. 06/24/14  Yes Brandi Love, Kaitlyn, Brandi Love  QUEtiapine Fumarate (SEROQUEL XR) 150 MG 24 hr tablet Take 150 mg by mouth at bedtime.   Yes [provider]  topiramate (TOPAMAX) 50 MG tablet Take 50 mg by mouth 2 (two) times daily.    Yes [provider]  UNABLE TO FIND Take 120 mLs by mouth 3 (three) times daily. Med Name: MedPass   Yes [provider]  venlafaxine XR (EFFEXOR-XR) 150 MG 24 hr capsule Take 150 mg by mouth daily with breakfast.   Yes [provider]  Vitamin D, Ergocalciferol, (DRISDOL) 50000 UNITS CAPS Take 50,000 Units by mouth every  7 (seven) days. On Sunday   Yes [provider]  feeding supplement (BOOST / RESOURCE BREEZE) LIQD Take 1 Container by mouth 3 (three) times daily between meals. Patient not taking: Reported on 11/30/2016 10/11/16   Brandi Love, Brandi Huguenin, MD  feeding supplement (BOOST / RESOURCE BREEZE) LIQD Take 1 Container by mouth 3 (three) times daily between meals. Patient not taking: Reported on 11/30/2016 10/11/16   Brandi Love, Brandi Huguenin, MD  Hydrocodone-Acetaminophen 5-300 MG TABS Take 1 tablet by mouth every 6 (six) hours. Patient not taking: Reported on 11/30/2016 10/11/16   Brandi Love, Brandi Huguenin, MD  insulin aspart (NOVOLOG) 100 UNIT/ML injection Inject 0-9 Units into the skin 3 (three) times daily with meals. Patient not taking: Reported on 12/26/2016 10/11/16   Brandi Love, Brandi Huguenin, MD  Nutritional Supplements (FEEDING SUPPLEMENT, OSMOLITE 1.2 CAL,) LIQD Place 1,000 mLs into feeding tube continuous. Patient on nocturnal tube feeds from 7 PM to 7 AM at 40 mL per hour via core track Patient not taking: Reported on 11/30/2016 10/11/16    Brandi Love, Brandi Huguenin, MD    Family History Family History  Problem Relation Age of Onset  . Cancer Father   . Hyperlipidemia Father   . Osteoarthritis Mother   . Hyperlipidemia Mother     Social History Social History  Substance Use Topics  . Smoking status: Never Smoker  . Smokeless tobacco: Never Used  . Alcohol use No     Allergies   Dhea [nutritional supplements]; Entex lq [phenylephrine-guaifenesin]; Indomethacin; Iodinated diagnostic agents; Melatonin; Propoxyphene; Risperidone and related; Shellfish allergy; Depakote [divalproex sodium]; Abilify [aripiprazole]; Dihydroergotamine; Doxycycline; Erythromycin; Lactose intolerance (gi); Nsaids; and Tape   Review of Systems Review of Systems  Constitutional: Positive for activity change and appetite change. Negative for diaphoresis and fever.  HENT: Negative for congestion, sinus pain, sinus pressure and sore throat.   Respiratory: Negative for cough, chest tightness and shortness of breath.   Cardiovascular: Negative for chest pain and leg swelling.  Gastrointestinal: Positive for constipation and nausea. Negative for abdominal distention, abdominal pain, diarrhea and vomiting.  Genitourinary: Negative for dysuria, flank pain, hematuria and urgency.  Neurological: Positive for headaches. Negative for tremors, seizures, weakness and light-headedness.   Physical Exam Updated Vital Signs BP 108/71   Pulse (!) 107   Temp 98.6 F (37 C) (Oral)   Resp 18   Ht 5\' 8"  (1.727 m)   Wt 70.8 kg (156 lb)   SpO2 97%   BMI 23.72 kg/m   Physical Exam  Constitutional:  Patient sitting comfortably in bed playing with video games in no acute distress.  HENT:  Head: Normocephalic and atraumatic.  Mouth/Throat: Oropharynx is clear and moist. No oropharyngeal exudate.  No evidence of tongue lacerations.  Eyes: EOM are normal.  Cardiovascular: Normal rate, regular rhythm and intact distal pulses.  Exam reveals no friction rub.     No murmur heard. Pulmonary/Chest: Effort normal. No respiratory distress. She has no wheezes.  No crackles  Abdominal: Soft. She exhibits no distension and no mass. There is tenderness (with light palpation in lower quadrants). There is no rebound and no guarding.  No ascites  Musculoskeletal: She exhibits no edema (of bilateral lower extremities).  Lymphadenopathy:    She has no cervical adenopathy.  Skin: Skin is warm and dry. Capillary refill takes less than 2 seconds. No rash noted. No erythema.  Psychiatric: She has a normal mood and affect. Her behavior is normal.   ED Treatments / Results  Labs (all labs  ordered are listed, but only abnormal results are displayed) Labs Reviewed  COMPREHENSIVE METABOLIC PANEL - Abnormal; Notable for the following:       Result Value   Glucose, Bld 124 (*)    BUN 28 (*)    Creatinine, Ser 1.92 (*)    GFR calc non Af Amer 32 (*)    GFR calc Af Amer 37 (*)    All other components within normal limits  CBG MONITORING, ED - Abnormal; Notable for the following:    Glucose-Capillary 138 (*)    All other components within normal limits  CBC    EKG  EKG Interpretation None      Radiology No results found.  Procedures Procedures (including critical care time)  Medications Ordered in ED Medications - No data to display  Initial Impression / Assessment and Plan / ED Course  I have reviewed the triage vital signs and the nursing notes.  Pertinent labs & imaging results that were available during my care of the patient were reviewed by me and considered in my medical decision making (see chart for details).  Patient presents without a specific chief complaint, but was brought to the ED for evaluation after question of a fall or increased lethargy. Per chart review the patient became lethargic after being told she was being discharged from her SNF. She has no acute current complaints and her labs are within her normal limits. She does not  show any evidence of dehydration on exam or lab work.   Her episode may have been a result of a vasovagal response, as her symptoms occurred while going to the bathroom. The patient's symptoms may also have been a result of transient hypotension, but this is less likely given that she does not show clinical evidence of dehydration and she is not on a BB. It is possible given her endorsement of decreased PO intake daily.   It is more likely that this episode is behavioral given the EMS report and patient's nonspecific symptoms. It is less likely that the patient experienced a seizure, as does not have any evidence of tongue lacerations, head trauma, incontinence, or post-ictal behavior.  The patient will be discharged to facility for further management of her chronic medical conditions.  Final Clinical Impressions(s) / ED Diagnoses   Final diagnoses:  Syncope, near   New Prescriptions New Prescriptions   No medications on file     Thomasene Ripple, MD 12/26/16 1441    Lajean Saver, MD 12/26/16 [phone]

## 2016-12-26 NOTE — ED Notes (Signed)
Report given to [place] picked them up

## 2016-12-26 NOTE — Telephone Encounter (Signed)
Social worker at [facility] reports speaking with PA on staff regarding Brandi Love. Peggy reports minimal weight loss of 1 pound since admission, that patient is not at risk, does not need hospitalization, and should not receive NG tube  **Patient was discharged from [facility] weighing 173 lb and is 153 lb currently.  Patient has lost 20 pounds while in care of [facility] nursing [facility]

## 2016-12-26 NOTE — ED Notes (Signed)
Bed: KC12 Expected date:  Expected time:  Means of arrival:  Comments: 41 yo f, ams

## 2016-12-26 NOTE — Discharge Instructions (Signed)
Please follow up with your primary care provider regarding your recent ED visit and management of your chronic medical conditions.

## 2016-12-27 ENCOUNTER — Telehealth: Payer: Self-pay | Admitting: *Deleted

## 2016-12-27 NOTE — Telephone Encounter (Signed)
Conference call with Clinical cytogeneticist (Education officer, museum at [facility] SN[facility]), Brandi Love), Brandi Love ([place]), and Brandi Love (NP with [facility]).  Per peggy CR has not lost weight while at [facility].  Vitals from  6/21 are 173 pounds and 156 pounds 9/6.  Peggy states [facility] admission weight on 6/22 was 158 pounds which is what she weighs currently on [facility] scale. CR is refusing meal trays, but according to peggy she is eating yogurt, sherbert, pudding, and snacks throughout.  According to [facility] staff CR is medically fine.  Visit to ED 9/6would support this. Peggy states CR "can't have anorexia at 158 pounds"  I explained DSM-5 diagnosis criteria for anorexia nervosa is not dependent on weight as it is a mental illness.  I believe CR is very mentally ill which may or may not be reflected by her physical status or weight.    Brandi Grizzle, NP states the patient has long standing struggle with various mental illnesses incuding BPD and and ED and is not suited to go home and take care of herself.  I agree and have been advocating for 3 months, along with her therapist brett debney, that she get residential treatment for her eating disorder.  Insurance has yet to approve treatment.  Representatives from [place] state finding eating disorder care is difficult as they feel CR is not motivated for treatment.  They do not want to pay for treatment if she is not going to comply with recommendations.  [facility] solution is to place CR in a therapeutic group home, enroll her in a DBT program, and have her receive outpatient treatment with myself and brett for her eating disorder.  I was overruled in asking for a higher level of care for her eating disorder

## 2016-12-30 ENCOUNTER — Telehealth: Payer: Self-pay | Admitting: *Deleted

## 2016-12-30 NOTE — Progress Notes (Signed)
Saw patient at [facility] SN[facility] Explained outcome of conference call.  That she is being denied eating disorder treatment due to misunderstanding about eating disorders (see my notes under phone call and email).  Discussed how she will need to work that much harder as she will not be getting much help from [facility].  Suggested project heal grant again.  Dinner tray arrived while I was there.  Read aloud of Life Without Ed while she ate.  She did not want to eat, but I insisted.    Emphasized she needs 3 meals/day.  Every day.  She is struggling with strong urges to restrict.  Also doesn't feel physically good and is not able to walk.  Again, emphasized need for improved nutrition.    I fear that she will not be successful since she is being denied the eating disorder treatment she needs.  She will not recover without intensive eating disorder treatment.

## 2016-12-30 NOTE — Telephone Encounter (Signed)
Follow up email sent after team conference call from [place] Director, Brandi Love  'Hi Brandi Love,   I just wanted to follow up with you on the conversation regarding Brandi Love.'s diagnosis of Anorexia Nervosa.  I didn't want to bring this up in front of the group during the meeting, but per the DSM-V pages 338-345), there are 3 primary diagnostic criteria for this diagnosis:  1.      restriction of energy intake relative to requirements, leading to a significantly low body weight, in the context of age, sex, and developmental trajectory, and physical health 2.      intense fear of gaining weight or becoming fat, or persistent behavior that interferes with weight gain, even though at a significantly low weight 3.      disturbance in the way in which one's body weight or shape is experienced, undue influence of body weight or shape on self-evaluation, or persistent lack of recognition of the seriousness of the low body weight   Based the 3 required criteria above, it would appear that this diagnosis is not appropriate for Brandi Love, given that she weighs 156 pounds.  Rather, it appears based on all the information shared by all parties prior and during today's interdisciplinary meeting, that Brandi Love. uses refusal of food as a weapon and a way to get attention, and is therefore, more related to her Borderline Personality Disorder than to Anorexia Nervosa.   Thank you for all your time and investment in Brandi Love.'s care."   My response "Team,  I wanted to clarify some confusion from earlier today.  lease see the DSM-5 diagnostic criteriaa below:  Other Specified Feeding or Eating Disorder: 307.59 (F50.8) This category applies to presentation in which symptoms characteristic of a feeding and eating disorder that cause clinically significant distress or impairment in social, occupational, or other important areas of functioning predominate but do not meet the full criteria for any of the disorders in the feeding  and eating disorders diagnostic class. The other specified feeding or eating disorder category is used in situations in which the clinician chooses to communicate the specific reason that the presentation does not meet the criteria for any specific feeding and eating disorder. This is done by recording "other specified feeding or eating disorder" followed by the specific reason (e.g., bulimia nervosa of low frequency). Examples of presentations that can be specified using the "other specified" designation include the following: 1. Atypical anorexia nervosa: All of the criteria for anorexia nervosa are met, except that despite significant weight loss, the individual's weight is within or above the normal range.  please take this into consideration"

## 2016-12-31 ENCOUNTER — Ambulatory Visit: Payer: Self-pay | Admitting: *Deleted

## 2017-02-26 ENCOUNTER — Encounter: Payer: Medicare Other | Attending: Family Medicine | Admitting: *Deleted

## 2017-02-26 DIAGNOSIS — E1122 Type 2 diabetes mellitus with diabetic chronic kidney disease: Secondary | ICD-10-CM | POA: Insufficient documentation

## 2017-02-26 DIAGNOSIS — Z713 Dietary counseling and surveillance: Secondary | ICD-10-CM | POA: Insufficient documentation

## 2017-02-26 DIAGNOSIS — N183 Chronic kidney disease, stage 3 (moderate): Secondary | ICD-10-CM | POA: Insufficient documentation

## 2017-02-26 DIAGNOSIS — F5000 Anorexia nervosa, unspecified: Secondary | ICD-10-CM | POA: Insufficient documentation

## 2017-02-26 DIAGNOSIS — F509 Eating disorder, unspecified: Secondary | ICD-10-CM

## 2017-02-26 NOTE — Progress Notes (Signed)
Appointment start time: 1400  Appointment end time: 1500  Patient was seen on 02/26/17 for nutrition counseling pertaining to disordered eating  Primary care provider: Molli Barrows  Therapist: NA Any other medical team members: NA   Assessment It's been 6 months since last office visit.  Was admitted to [facility] for severe dehydration and malnutrition and then discharged to [facility] rehab center for about 4 months.  Insurance stopped paying for her ED therapist Will no longer be working with Wilfred Lacy.  Doesn't have a therapist  Not sleeping well.  Stopped narcotics cold [place] per PCP instructions.  Took 5 Tylenol last night as a result.  Was instructed today to do 2 every 6 hours.   Good energy Headaches all day every day Dizziness every other day when she stands up No blurred vision No  Chest pains.  SOB when walking No N/V or stomachaches .  BM a few times a day.  Colace twice and stopped Miralax Positive for cold intolerance  Wt Readings from Last 3 Encounters:  12/26/16 156 lb (70.8 kg)  11/30/16 156 lb 8 oz (71 kg)  10/10/16 173 lb 3.2 oz (78.6 kg)    Mental health diagnosis: OSFED (atypical AN)  Dietary assessment: A typical day consists of 3 meals and some snacks  Safe foods include: salad, healthy choice steamers, leftovers, hard boiled eggs, english muffins, toast, egg rolls fear foods include:everything else  24 hour recall:  B: hard boiled egg and english muffin with butter.  Water L: carter brothers: 1/2 bbq, 1/2 fries, green beans and pepsi D: leftover carter brothers and 1/2 hamburger steak with gravy, green beans.  Water S: caramel apple   Exercise (what type)- walks 1 hour every other day EAT 26 score: 49    Estimated energy intake: 1000 kcal  Estimated energy needs: 2200 kcal 275 g CHO 110 g pro 73 g fat  Nutrition Diagnosis: NI-1.4 Inadequate energy intake As related to restrictive eating disorder.  As evidenced by dietary  recall.  Intervention/Goals: suggested [employer] as a compromise for ED therapist.   Kerie is still VERY disordered in her thoughts and behaviors  Stop Colace and start back Miralax 3 meals every day day  Protein, starch with fat and dairy or fruit or vegetable   Breakfast: english muffin with butter, 2 eggs and grapes Lunch: salad with chicken, dressing, more cheese and crutons Dinner: Nurse, mental health with corn or green bean Snacks: toast with butter and/or jelly or uncrustable or candy  Keep up with water No exercise until I say so  Read Life Without Ed    Monitoring and Evaluation: Patient will follow up in 1 weeks.

## 2017-02-26 NOTE — Patient Instructions (Addendum)
Sop Colace and start back Miralax 3 meals every day day  Protein, starch with fat and dairy or fruit or vegetable   Breakfast: english muffin with butter, 2 eggs and grapes Lunch: salad with chicken, dressing, more cheese and crutons Dinner: Nurse, mental health with corn or green bean Snacks: toast with butter and/or jelly or uncrustable or candy  Keep up with water No exercise until I say so  Read Life Without Ed

## 2017-03-06 ENCOUNTER — Encounter: Payer: Medicare Other | Admitting: *Deleted

## 2017-03-06 DIAGNOSIS — Z713 Dietary counseling and surveillance: Secondary | ICD-10-CM | POA: Diagnosis not present

## 2017-03-06 DIAGNOSIS — F509 Eating disorder, unspecified: Secondary | ICD-10-CM

## 2017-03-06 NOTE — Progress Notes (Signed)
Appointment start time: 1400  Appointment end time: 1500  Patient was seen on 03/06/17 for nutrition counseling pertaining to disordered eating  Primary care provider: Molli Barrows  Therapist: NA Any other medical team members: NA   Assessment Nausea daily.  Stomachache.  Restricting.  Didn't read or watch or follow recommendations.  appt scheduled with Dory Peru for 12/10 Weight continues to drop.  Continues to feel increasingly weak   Mental health diagnosis: OSFED (atypical AN)  Dietary assessment: Safe foods include: salad, healthy choice steamers, leftovers, hard boiled eggs, english muffins, toast, egg rolls fear foods include:everything else  24 hour recall:  B: banana, triscuits with guac, 4 mini hershey gatorade 3 handfuls chips, cafe steamer, 2 handful chips and dip Water with laxatives More water   Estimated energy intake: 1000 kcal  Estimated energy needs: 2200 kcal 275 g CHO 110 g pro 73 g fat  Nutrition Diagnosis: NI-1.4 Inadequate energy intake As related to restrictive eating disorder.  As evidenced by dietary recall.  Intervention/Goals: 1. Read Life Without Ed 4 chapters each day 2. Watch Embrace 3. Fill out project heal application 4. Eat 3 meals a day 5. Drink 3 cups water/day 6. No more laxatives   Breakfast: english muffin with butter, 2 eggs and grapes Lunch: salad with chicken, dressing, more cheese and crutons Dinner: Nurse, mental health with corn or green bean Snacks: toast with butter and/or jelly or uncrustable or candy  Keep up with water No exercise until I say so    Monitoring and Evaluation: Patient will follow up in 2 weeks.

## 2017-03-06 NOTE — Patient Instructions (Addendum)
1. Read Life Without Ed 4 chapters each day 2. Watch Embrace 3. Fill out project heal application 4. Eat 3 meals a day 5. Drink 3 cups water/day 6. No more laxatives   Breakfast: english muffin with butter, 2 eggs and grapes Lunch: salad with chicken, dressing, more cheese and crutons Dinner: Nurse, mental health with corn or green bean Snacks: toast with butter and/or jelly or uncrustable or candy

## 2017-03-07 ENCOUNTER — Encounter (HOSPITAL_COMMUNITY): Payer: Self-pay

## 2017-03-07 ENCOUNTER — Telehealth: Payer: Self-pay | Admitting: *Deleted

## 2017-03-07 DIAGNOSIS — R1013 Epigastric pain: Secondary | ICD-10-CM | POA: Insufficient documentation

## 2017-03-07 DIAGNOSIS — R197 Diarrhea, unspecified: Secondary | ICD-10-CM | POA: Insufficient documentation

## 2017-03-07 DIAGNOSIS — R112 Nausea with vomiting, unspecified: Secondary | ICD-10-CM | POA: Insufficient documentation

## 2017-03-07 DIAGNOSIS — R079 Chest pain, unspecified: Secondary | ICD-10-CM | POA: Insufficient documentation

## 2017-03-07 DIAGNOSIS — Z79899 Other long term (current) drug therapy: Secondary | ICD-10-CM | POA: Insufficient documentation

## 2017-03-07 DIAGNOSIS — N183 Chronic kidney disease, stage 3 (moderate): Secondary | ICD-10-CM | POA: Diagnosis not present

## 2017-03-07 DIAGNOSIS — I129 Hypertensive chronic kidney disease with stage 1 through stage 4 chronic kidney disease, or unspecified chronic kidney disease: Secondary | ICD-10-CM | POA: Diagnosis not present

## 2017-03-07 DIAGNOSIS — Z7982 Long term (current) use of aspirin: Secondary | ICD-10-CM | POA: Insufficient documentation

## 2017-03-07 DIAGNOSIS — E039 Hypothyroidism, unspecified: Secondary | ICD-10-CM | POA: Diagnosis not present

## 2017-03-07 DIAGNOSIS — M26622 Arthralgia of left temporomandibular joint: Secondary | ICD-10-CM | POA: Insufficient documentation

## 2017-03-07 DIAGNOSIS — R0602 Shortness of breath: Secondary | ICD-10-CM | POA: Diagnosis not present

## 2017-03-07 LAB — CBG MONITORING, ED: GLUCOSE-CAPILLARY: 120 mg/dL — AB (ref 65–99)

## 2017-03-07 LAB — COMPREHENSIVE METABOLIC PANEL
ALBUMIN: 4.1 g/dL (ref 3.5–5.0)
ALK PHOS: 71 U/L (ref 38–126)
ALT: 12 U/L — ABNORMAL LOW (ref 14–54)
AST: 17 U/L (ref 15–41)
Anion gap: 11 (ref 5–15)
BILIRUBIN TOTAL: 0.5 mg/dL (ref 0.3–1.2)
BUN: 21 mg/dL — AB (ref 6–20)
CALCIUM: 9.8 mg/dL (ref 8.9–10.3)
CO2: 20 mmol/L — AB (ref 22–32)
Chloride: 106 mmol/L (ref 101–111)
Creatinine, Ser: 1.94 mg/dL — ABNORMAL HIGH (ref 0.44–1.00)
GFR calc Af Amer: 37 mL/min — ABNORMAL LOW (ref >60)
GFR calc non Af Amer: 32 mL/min — ABNORMAL LOW (ref >60)
GLUCOSE: 127 mg/dL — AB (ref 65–99)
POTASSIUM: 5 mmol/L (ref 3.5–5.1)
Sodium: 137 mmol/L (ref 135–145)
TOTAL PROTEIN: 6.8 g/dL (ref 6.5–8.1)

## 2017-03-07 LAB — LIPASE, BLOOD: Lipase: 66 U/L — ABNORMAL HIGH (ref 11–51)

## 2017-03-07 LAB — CBC
HEMATOCRIT: 40.5 % (ref 36.0–46.0)
Hemoglobin: 13.6 g/dL (ref 12.0–15.0)
MCH: 30.2 pg (ref 26.0–34.0)
MCHC: 33.6 g/dL (ref 30.0–36.0)
MCV: 89.8 fL (ref 78.0–100.0)
Platelets: 334 10*3/uL (ref 150–400)
RBC: 4.51 MIL/uL (ref 3.87–5.11)
RDW: 13.9 % (ref 11.5–15.5)
WBC: 11.1 10*3/uL — ABNORMAL HIGH (ref 4.0–10.5)

## 2017-03-07 NOTE — Telephone Encounter (Signed)
Group email sent to schedule phone session with team regarding Brandi Love's care.  Last phone session her therapist Brandi Love was not available and Brandi Love decided Brandi Love eating disorder was not an issue.  For this phone session I am not available and Brandi Love will proceed without me

## 2017-03-07 NOTE — Telephone Encounter (Signed)
Brandi Love states she will be discharging home on 11/1, not to a group home as planned

## 2017-03-07 NOTE — Telephone Encounter (Signed)
Brandi Love with Brandi Love called because she checked on Brandi Love and she is concerned.  Brandi Love's vital signs are worsening and she doesn't think Brandi Love should be living at Love.  I agree and explained that her Brandi Love Love consisting on Brandi Love etc decided without the input of Brandi Love (Brandi Love and Brandi Love) that Brandi Love wasn't a problem and that she didn't need higher level of care and they sent her Love from the nursing Love and stopped paying for her eating Love therapist.  That Brandi Love has been without therapeutic treatment of her eating Love for months and that without proper treatment she will continue to decline, her medical status will continue to deteriorate.  Brandi seems alarmed and said she will speak with the case manager at Brandi Love, presumably Brandi Love

## 2017-03-07 NOTE — ED Triage Notes (Signed)
Pt states that she was recently seen here for a eating disorder and went to [facility]. Pt states that she began to have lower abd pain tonight with vomiting and diarrhea.

## 2017-03-08 ENCOUNTER — Telehealth: Payer: Self-pay | Admitting: *Deleted

## 2017-03-08 ENCOUNTER — Emergency Department (HOSPITAL_COMMUNITY): Payer: Medicare Other

## 2017-03-08 ENCOUNTER — Emergency Department (HOSPITAL_COMMUNITY)
Admission: EM | Admit: 2017-03-08 | Discharge: 2017-03-08 | Disposition: A | Discharge status: Home / Self Care | Payer: Medicare Other | Attending: Emergency Medicine | Admitting: Emergency Medicine

## 2017-03-08 DIAGNOSIS — R109 Unspecified abdominal pain: Secondary | ICD-10-CM

## 2017-03-08 DIAGNOSIS — M26622 Arthralgia of left temporomandibular joint: Secondary | ICD-10-CM

## 2017-03-08 DIAGNOSIS — R079 Chest pain, unspecified: Secondary | ICD-10-CM | POA: Diagnosis not present

## 2017-03-08 DIAGNOSIS — R197 Diarrhea, unspecified: Secondary | ICD-10-CM

## 2017-03-08 LAB — URINALYSIS, ROUTINE W REFLEX MICROSCOPIC
Bilirubin Urine: NEGATIVE
Glucose, UA: NEGATIVE mg/dL
Hgb urine dipstick: NEGATIVE
KETONES UR: NEGATIVE mg/dL
LEUKOCYTES UA: NEGATIVE
NITRITE: NEGATIVE
PROTEIN: NEGATIVE mg/dL
Specific Gravity, Urine: 1.018 (ref 1.005–1.030)
pH: 5 (ref 5.0–8.0)

## 2017-03-08 MED ORDER — ONDANSETRON HCL 4 MG/2ML IJ SOLN
4.0000 mg | Freq: Once | INTRAMUSCULAR | Status: AC
Start: 2017-03-08 — End: 2017-03-08
  Administered 2017-03-08: 4 mg via INTRAVENOUS
  Filled 2017-03-08: qty 2

## 2017-03-08 MED ORDER — SODIUM CHLORIDE 0.9 % IV BOLUS (SEPSIS)
1000.0000 mL | Freq: Once | INTRAVENOUS | Status: AC
  Administered 2017-03-08: 1000 mL via INTRAVENOUS

## 2017-03-08 MED ORDER — DICYCLOMINE HCL 10 MG PO CAPS
10.0000 mg | ORAL_CAPSULE | Freq: Once | ORAL | Status: AC
  Administered 2017-03-08: 10 mg via ORAL
  Filled 2017-03-08: qty 1

## 2017-03-08 NOTE — ED Notes (Signed)
The pt has multiple complaints  Constipation  She took 5 laxatives  Yesterday and Wednesday diarrhea today from the laxatives  She was told by nurses yesterday that she was dehydrated  Leg cramps jaw pain and chest pain sob dizziness  Dry mouth  She has an appointment with her doctor on monday

## 2017-03-08 NOTE — ED Notes (Signed)
Pt states pain only minimally reduced and nausea remains about the same

## 2017-03-08 NOTE — ED Notes (Signed)
Pt now c/o of CP in the waiting area, EKG ordered

## 2017-03-08 NOTE — Telephone Encounter (Signed)
   Our client, Brandi Love, was taken to the emergency room last night.  Because she has been denied the appropriate level of care needed for her eating disorder, her condition has continued to decline.  Without residential treatment, she has not received adequate nutrition and without seeing her eating disorder therapist, or any therapist for that matter, she has engaged in disordered eating behaviors.    Her nurse came by yesterday and found her to have low blood pressure and an elevated heart rate.  She also appeared to be dehydrated (from purging) and recommended she go to the ER.  She also complains of chest pain and shortness of breath.  She continues to lose weight.  She reports abdominal pain, nausea, joint pain, and light-headedness.  All of these are symptoms of malnutrition and dehydration.     At the hospital she was found to be tachycardic and had leukocytosis.  She was given IV fluids for her dehydration and Zofran for nausea and sent home.  She texts me constantly and she needs 24 hour support and care.  [place] be out of the [place] and I am very concerned that if things don't turn around we will be right back where we started this summer with multiple trips to the ER and finally hospital admission with a feeding tube.  Or worse.  I implore you all to pursue the level of care she needs for her eating disorder.

## 2017-03-08 NOTE — ED Notes (Signed)
To x-ray

## 2017-03-08 NOTE — ED Provider Notes (Signed)
[facility] EMERGENCY DEPARTMENT Provider Note   CSN: [id number] Arrival date & time: 03/07/17  2255     History   Chief Complaint Chief Complaint  Patient presents with  . Abdominal Pain    HPI Brandi Love is a 41 y.o. female who presents with multiple complaints. PMH significant for multiple neuropsychiatric disorders including hearing impairment, seizures, eating disorder, borderline personality disorder, compulsive skin picking, depression, obsessive-compulsive disorder, diabetes mellitus, hypertension, hyperlipidemia, and hypothyroidism. She has been at [facility] for rehab. She was discharged from SNF in early November to home. On multiple psych meds. The patient states that she is mainly here for dehydration. She has home health and an ACT team who come to see her. They came yesterday and told her her blood pressure is low and heart rate is high and she should come to the ED to get checked out. She also has been taking multiple laxatives for constipation and to help reduce her weight. She took 5 Dulcolax on 3 days ago and 5 yesterday. Since then she's had severe, constant abdominal cramping as well as several episodes of vomiting and nausea. She also reports dysuria. She is s/p hysterectomy and multiple laparoscopic surgeries for endometriosis. She denies SI, HI, AVH, intentional self-injury.  She also complains of chest pain and SOB which started at about midnight last night. She states she has had this before. When asked to further characterized she states "it's hard to describe".   Additionally she complains of left sided jaw pain for the past several days. It is worse with opening and closing her jaw and chewing making it difficult to eat. She denies fever, facial swelling, dental pain.  HPI  Past Medical History:  Diagnosis Date  . Anemia   . Anxiety   . Arthritis    "hands" (09/18/2016)  . Chronic lower back pain   . Chronic renal insufficiency   .  Deafness   . Depression   . Diabetes ([place])    "borderline"  . Eating disorder   . Endometriosis   . GERD (gastroesophageal reflux disease)   . History of kidney stones   . Hyperlipidemia   . Hypertension   . 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 Migraine    "frequency varies" (09/18/2016)  . OCD (obsessive compulsive disorder)   . Pneumonia 2011  . PONV (postoperative nausea and vomiting)   . Pulmonary embolism ([place]) 10/2001  . Seizures ([place]) 09/2001 X 1   "day after my hysterectomy"  . Tachycardia     Patient Active Problem List   Diagnosis Date Noted  . Urinary tract infection with hematuria   . OCD (obsessive compulsive disorder) 09/29/2016  . Eating disorder 09/29/2016  . Deafness congenital 09/29/2016  . Intractable nausea and vomiting 09/29/2016  . Bloody diarrhea 09/29/2016  . AKI (acute kidney injury) ([place]) 09/29/2016  . Severe dehydration 09/29/2016  . Severe protein-calorie malnutrition ([place]) 09/29/2016  . Congenital hypothyroidism 09/29/2016  . Hypertriglyceridemia 09/29/2016  . Anemia 09/29/2016  . Endometriosis 09/29/2016  . MDD (major depressive disorder) 09/29/2016  . Hyperlipidemia 09/29/2016  . CKD (chronic kidney disease), stage III ([place]) 09/29/2016  . Acute kidney injury ([place]) 09/29/2016  . OAB (overactive bladder) 09/29/2016  . Sepsis ([place]) 09/18/2016  . Acute pyelonephritis 09/18/2016  . MDD (major depressive disorder), recurrent, severe, with psychosis ([place]) 12/11/2014  . Eating disorder, unspecified 11/23/2012  . Hypothyroidism 11/23/2012  . Dyslipidemia 11/23/2012  . Sinus tachycardia 11/23/2012  .  CKD (chronic kidney disease) stage 3, GFR 30-59 ml/min (H[facility]) 11/18/2012  . Protein-calorie malnutrition, severe ([place]) 11/13/2012  . Abdominal pain, epigastric 11/12/2012  . Dehydration 11/12/2012  . Acute on chronic renal failure ([place]) 11/12/2012  . Leukocytosis, unspecified 11/12/2012  . Hyponatremia 11/12/2012  . Borderline  personality disorder ([place]) 11/12/2012    Past Surgical History:  Procedure Laterality Date  . ABDOMINAL HYSTERECTOMY  09/2001   'except for right ovary'  . BREAST LUMPECTOMY Left 2000   benign  . BREAST LUMPECTOMY Right 2002   benign  . COCHLEAR IMPLANT Bilateral 2004-2010   "right-left"  . COLONOSCOPY  06/2015    Dr Rolm Bookbinder at [facility] for rectal bleeding: internal hemorrhoids, small anal fissure.    . ESOPHAGOGASTRODUODENOSCOPY (EGD) N/A 10/07/2016   Performed by Ladene Artist, MD at [facility] ENDOSCOPY  . EYE MUSCLE SURGERY Left 1980   "lazy eye"  . EYE MUSCLE SURGERY Right 1990   "lazy eye"  . LAPAROSCOPIC ABDOMINAL EXPLORATION  2001 and 2002   for endometriosis    OB History    No data available       Home Medications    Prior to Admission medications   Medication Sig Start Date End Date Taking? Authorizing Provider  aspirin 81 MG chewable tablet Chew by mouth daily.   Yes [provider]  atorvastatin (LIPITOR) 20 MG tablet Take 20 mg by mouth daily.   Yes [provider]  docusate sodium (COLACE) 100 MG capsule Take 100 mg by mouth 2 (two) times daily.   Yes [provider]  haloperidol (HALDOL) 5 MG tablet Take 5 mg by mouth at bedtime.    Yes [provider]  hydrOXYzine (ATARAX/VISTARIL) 50 MG tablet Take 50 mg 3 (three) times daily as needed by mouth.   Yes [provider]  lamoTRIgine (LAMICTAL) 200 MG tablet Take 200 mg by mouth daily.   Yes [provider]  levothyroxine (SYNTHROID, LEVOTHROID) 150 MCG tablet Take 150 mcg by mouth daily.   Yes [provider]  lurasidone (LATUDA) 20 MG TABS tablet Take 20 mg by mouth daily.    Yes [provider]  magnesium oxide (MAG-OX) 400 MG tablet Take 400 mg daily by mouth.   Yes [provider]  Multiple Vitamin (MULTIVITAMIN WITH MINERALS) TABS Take 1 tablet by mouth daily.   Yes [provider]  ondansetron (ZOFRAN) 4 MG  tablet Take 4 mg by mouth every 8 (eight) hours as needed for nausea.   Yes [provider]  pantoprazole (PROTONIX) 40 MG tablet Take 40 mg by mouth daily.   Yes [provider]  QUEtiapine Fumarate (SEROQUEL XR) 150 MG 24 hr tablet Take 150 mg by mouth at bedtime.   Yes [provider]  topiramate (TOPAMAX) 50 MG tablet Take 50 mg by mouth 2 (two) times daily.    Yes [provider]  venlafaxine XR (EFFEXOR-XR) 150 MG 24 hr capsule Take 150 mg by mouth daily with breakfast.   Yes [provider]  Vitamin D, Ergocalciferol, (DRISDOL) 50000 UNITS CAPS Take 50,000 Units by mouth every 7 (seven) days. On Sunday   Yes [provider]  feeding supplement (BOOST / RESOURCE BREEZE) LIQD Take 1 Container by mouth 3 (three) times daily between meals. Patient not taking: Reported on 11/30/2016 10/11/16   Elgergawy, Silver Huguenin, MD  feeding supplement (BOOST / RESOURCE BREEZE) LIQD Take 1 Container by mouth 3 (three) times daily between meals. Patient not  taking: Reported on 11/30/2016 10/11/16   Elgergawy, Silver Huguenin, MD  Hydrocodone-Acetaminophen 5-300 MG TABS Take 1 tablet by mouth every 6 (six) hours. Patient not taking: Reported on 11/30/2016 10/11/16   Elgergawy, Silver Huguenin, MD  insulin aspart (NOVOLOG) 100 UNIT/ML injection Inject 0-9 Units into the skin 3 (three) times daily with meals. Patient not taking: Reported on 12/26/2016 10/11/16   Elgergawy, Silver Huguenin, MD  Nutritional Supplements (FEEDING SUPPLEMENT, OSMOLITE 1.2 CAL,) LIQD Place 1,000 mLs into feeding tube continuous. Patient on nocturnal tube feeds from 7 PM to 7 AM at 40 mL per hour via core track Patient not taking: Reported on 11/30/2016 10/11/16   Elgergawy, Silver Huguenin, MD  promethazine (PHENERGAN) 25 MG tablet Take 1 tablet (25 mg total) by mouth every 6 (six) hours as needed for nausea or vomiting. Patient not taking: Reported on 03/08/2017 06/24/14   Alvina Chou, PA-C    Family History Family  History  Problem Relation Age of Onset  . Cancer Father   . Hyperlipidemia Father   . Osteoarthritis Mother   . Hyperlipidemia Mother     Social History Social History   Tobacco Use  . Smoking status: Never Smoker  . Smokeless tobacco: Never Used  Substance Use Topics  . Alcohol use: No  . Drug use: No     Allergies   Dhea [nutritional supplements]; Entex lq [phenylephrine-guaifenesin]; Indomethacin; Iodinated diagnostic agents; Melatonin; Propoxyphene; Risperidone and related; Shellfish allergy; Depakote [divalproex sodium]; Abilify [aripiprazole]; Dihydroergotamine; Doxycycline; Erythromycin; Lactose intolerance (gi); Nsaids; and Tape   Review of Systems Review of Systems  Constitutional: Positive for appetite change. Negative for chills and fever.  HENT: Negative for dental problem and facial swelling.        +jaw pain  Respiratory: Positive for shortness of breath.   Cardiovascular: Positive for chest pain.  Gastrointestinal: Positive for abdominal pain, diarrhea, nausea and vomiting.  Genitourinary: Positive for dysuria.  Musculoskeletal: Positive for arthralgias.  Neurological: Positive for light-headedness. Negative for syncope.  Psychiatric/Behavioral: Negative for self-injury. The patient is not nervous/anxious.      Physical Exam Updated Vital Signs BP 126/83 (BP Location: Right Arm)   Pulse (!) 118   Temp 98.8 F (37.1 C)   Resp 18   SpO2 96%   Physical Exam  Constitutional: She is oriented to person, place, and time. She appears well-developed and well-nourished. No distress.  Calm, cooperative. Hearing aid in place  HENT:  Head: Normocephalic and atraumatic.  Mouth/Throat: Uvula is midline. There is trismus (mild) in the jaw. No dental caries.  Mild tenderness over L TMJ. No facial swelling. Dentition is unremarkable  Eyes: Conjunctivae are normal. Pupils are equal, round, and reactive to light. Right eye exhibits no discharge. Left eye exhibits no  discharge. No scleral icterus.  Neck: Normal range of motion.  Cardiovascular: Tachycardia present. Exam reveals no gallop and no friction rub.  No murmur heard. Pulmonary/Chest: Effort normal and breath sounds normal. No stridor. No respiratory distress. She has no wheezes. She has no rales. She exhibits no tenderness.  Abdominal: Soft. Bowel sounds are normal. She exhibits no distension and no mass. There is tenderness (epigastric tenderness). There is no rebound and no guarding. No hernia.  Neurological: She is alert and oriented to person, place, and time.  Skin: Skin is warm and dry.  Psychiatric: She has a normal mood and affect. Her behavior is normal.  Nursing note and vitals reviewed.    ED Treatments / Results  Labs (all labs  ordered are listed, but only abnormal results are displayed) Labs Reviewed  LIPASE, BLOOD - Abnormal; Notable for the following components:      Result Value   Lipase 66 (*)    All other components within normal limits  COMPREHENSIVE METABOLIC PANEL - Abnormal; Notable for the following components:   CO2 20 (*)    Glucose, Bld 127 (*)    BUN 21 (*)    Creatinine, Ser 1.94 (*)    ALT 12 (*)    GFR calc non Af Amer 32 (*)    GFR calc Af Amer 37 (*)    All other components within normal limits  CBC - Abnormal; Notable for the following components:   WBC 11.1 (*)    All other components within normal limits  URINALYSIS, ROUTINE W REFLEX MICROSCOPIC - Abnormal; Notable for the following components:   APPearance HAZY (*)    All other components within normal limits  CBG MONITORING, ED - Abnormal; Notable for the following components:   Glucose-Capillary 120 (*)    All other components within normal limits    EKG  EKG Interpretation  Date/Time:  Friday March 07 2017 23:08:26 EST Ventricular Rate:  118 PR Interval:  160 QRS Duration: 76 QT Interval:  306 QTC Calculation: 428 R Axis:   73 Text Interpretation:  Sinus tachycardia T wave  abnormality, consider inferior ischemia Abnormal ECG No significant change since last tracing Confirmed by Pryor Curia [phone]) on 03/08/2017 6:11:09 AM       Radiology Dg Chest 2 View  Result Date: 03/08/2017 CLINICAL DATA:  Intermittent substernal chest pain. EXAM: CHEST  2 VIEW COMPARISON:  November 30, 2016 FINDINGS: The heart size and mediastinal contours are within normal limits. Both lungs are clear. The visualized skeletal structures are unremarkable. IMPRESSION: No active cardiopulmonary disease. Electronically Signed   By: Dorise Bullion III M.D   On: 03/08/2017 07:05    Procedures Procedures (including critical care time)  Medications Ordered in ED Medications  sodium chloride 0.9 % bolus 1,000 mL (1,000 mLs Intravenous New Bag/Given 03/08/17 0759)  dicyclomine (BENTYL) capsule 10 mg (10 mg Oral Given 03/08/17 0759)  ondansetron (ZOFRAN) injection 4 mg (4 mg Intravenous Given 03/08/17 0759)     Initial Impression / Assessment and Plan / ED Course  I have reviewed the triage vital signs and the nursing notes.  Pertinent labs & imaging results that were available during my care of the patient were reviewed by me and considered in my medical decision making (see chart for details).  41 year old female presents with multiple complaints primarily sent to r/o significant dehydration due to eating d/o and diarrhea. She is tachycardic but otherwise vitals are normal. She also has a hx of chronic tachycardia. CBC is remarkable for mild leukocytosis of 11.1. CMP is remarkable for mild hyperglycemia (127), SCr of 1.94 which is around her baseline. Lipase is 66. In the setting of normal LFTs and how comfortable she looks I don't think this represents an early pancreatitis. UA is normal. She was given fluids, bentyl, zofran. She tolerated PO. She states she doesn't feel much better. I think it will take some time given the amount of Ducolax she took. EKG is sinus tachycardia and CXR is  negative. I advised to closely f/u with her PCP.   Final Clinical Impressions(s) / ED Diagnoses   Final diagnoses:  TMJ tenderness, left  Abdominal pain, unspecified abdominal location  Diarrhea, unspecified type  Chest pain, unspecified type  ED Discharge Orders    None       Recardo Evangelist, PA-C 03/08/17 0211    Recardo Evangelist, PA-C 03/08/17 1007    Pattricia Boss, MD 03/08/17 [phone]

## 2017-03-08 NOTE — Discharge Instructions (Signed)
Please drink plenty of fluids Follow up with your doctor Return if worsening

## 2017-03-11 ENCOUNTER — Telehealth: Payer: Self-pay | Admitting: *Deleted

## 2017-03-11 NOTE — Telephone Encounter (Signed)
Audrea Muscat from Medicaid questioning my involvement in Merline's care. Questioning my communication with her and directed me instead to [employer].  I called Debbie's office and she is on vacation.  Directed to her assistant, Jovita Kussmaul.  I called Lonn Georgia who is out of the office and left her a message expressing my concern about Rashema's eating disorder

## 2017-03-18 ENCOUNTER — Encounter: Payer: Medicare Other | Admitting: *Deleted

## 2017-03-18 DIAGNOSIS — F509 Eating disorder, unspecified: Secondary | ICD-10-CM

## 2017-03-18 DIAGNOSIS — Z713 Dietary counseling and surveillance: Secondary | ICD-10-CM | POA: Diagnosis not present

## 2017-03-18 NOTE — Progress Notes (Signed)
Appointment start time: 1130 Appointment end time: 1230  Patient was seen on 03/18/17 for nutrition counseling pertaining to disordered eating  Primary care provider: Molli Barrows  Therapist: NA Any other medical team members: NA   Assessment Saw cards yesterday.  Recommended exercise.  Doesn't know she has an eating disorder Nausea every day.  No vomiting Chest pains every day.  All the time.  Never stops Headaches all the time.  Taking mag oxide and that helps sometimes Dizziness all the time.  Difficult to stand in my office today BM every other day, maybe - 4 colace , not miralax  Sleeping well.  Adequate sleep.  Tired all the time Cold intolerance Watched Embrace   Mental health diagnosis: OSFED (atypical AN)  Dietary assessment: Safe foods include: salad, healthy choice steamers, leftovers, hard boiled eggs, english muffins, toast, egg rolls fear foods include:everything else  24 hour recall:  Cheezits, donut holes ramen noodles gingerale gingersnaps water   Estimated energy intake: 1000 kcal  Estimated energy needs: 2200 kcal 275 g CHO 110 g pro 73 g fat  Nutrition Diagnosis: NI-1.4 Inadequate energy intake As related to restrictive eating disorder.  As evidenced by dietary recall.  Intervention/Goals: no do engage in physical activity.  Body is not strong or nourished enough.  She is eating, but not enough.  She knows she isn't eating enough.  Stopped reading when she was mad at me.  Reflected back how disregarding my treatment recommendations doesn't hurt me, but hurts her instead.  [facility].  Reiterated recovery messages   1. Read Life Without Ed 4 chapters each day 2. Watch Embrace again 3. Fill out project heal application 4. Eat 3 meals a day or Ensure  5. Drink 3 cups water/day 6. No more laxatives/use Miralax instead   Breakfast: english muffin with butter, 2 eggs and grapes Lunch: salad with chicken, dressing, more cheese and  crutons Dinner: Nurse, mental health with corn or green bean Snacks: toast with butter and/or jelly or uncrustable or candy  Keep up with water No exercise until I say so    Monitoring and Evaluation: Patient will follow up in 1 weeks.

## 2017-03-18 NOTE — Patient Instructions (Signed)
1. Read Life Without Ed 4 chapters each day 2. Watch Embrace 3. Fill out project heal application 4. Eat 3 meals a day or an equal number of Ensure 5. Drink 3 cups water/day 6. No more laxatives

## 2017-03-26 ENCOUNTER — Encounter: Payer: Medicare Other | Attending: Family Medicine | Admitting: *Deleted

## 2017-03-26 ENCOUNTER — Encounter: Payer: Self-pay | Admitting: *Deleted

## 2017-03-26 DIAGNOSIS — Z713 Dietary counseling and surveillance: Secondary | ICD-10-CM | POA: Insufficient documentation

## 2017-03-26 DIAGNOSIS — E1122 Type 2 diabetes mellitus with diabetic chronic kidney disease: Secondary | ICD-10-CM | POA: Insufficient documentation

## 2017-03-26 DIAGNOSIS — N183 Chronic kidney disease, stage 3 (moderate): Secondary | ICD-10-CM | POA: Diagnosis not present

## 2017-03-26 DIAGNOSIS — F5000 Anorexia nervosa, unspecified: Secondary | ICD-10-CM | POA: Diagnosis not present

## 2017-03-26 DIAGNOSIS — F509 Eating disorder, unspecified: Secondary | ICD-10-CM

## 2017-03-26 NOTE — Progress Notes (Signed)
Appointment start time: 1700 Appointment end time: 1800  Patient was seen on 03/26/17 for nutrition counseling pertaining to disordered eating  Primary care provider: Molli Barrows  Therapist: NA Any other medical team members: NA   Assessment Things are going about the same.  Finished Life without Ed.  Ordered Goodbye Ed, Hello me Parents reinforce diet messages.  She tries to ignore that Exercised today doing Zumba Not checking her weight because she forgets  Dizzy all the time Headaches all the time Sometimes nausea Sometimes sees spots Chest pain all the time Short of breath Constipated.  2 colace twice a day bruises easily. Stumbling/triping - Pam noticed that (nurse)     Mental health diagnosis: OSFED (atypical AN)  Dietary assessment: Safe foods include: salad, healthy choice steamers, leftovers, hard boiled eggs, english muffins, toast, egg rolls fear foods include:everything else  24 hour recall:  Cheezits, donut holes ramen noodles gingerale gingersnaps water   Estimated energy intake: 1000 kcal  Estimated energy needs: 2200 kcal 275 g CHO 110 g pro 73 g fat  Nutrition Diagnosis: NI-1.4 Inadequate energy intake As related to restrictive eating disorder.  As evidenced by dietary recall.  Intervention/Goals: no do engage in physical activity.  Body is not strong or nourished enough.  She is eating, but not enough.  She knows she isn't eating enough.    [place].  Reiterated recovery messages   1. Read Life Without Ed/journal daily 2. Watch Embrace again 3. Fill out project heal application 4. Eat 3 meals a day or Ensure  5. Drink 3 cups water/day 6. No more laxatives/use Miralax instead     Monitoring and Evaluation: Patient will follow up in 1 weeks.

## 2017-03-27 ENCOUNTER — Telehealth: Payer: Self-pay | Admitting: *Deleted

## 2017-03-27 NOTE — Telephone Encounter (Signed)
From   Maylon Peppers, MSW, LCSW-A  [facility]  "Can [name] take a short walk like 15 minutes for some fresh air?  Getting fresh air can help with reducing depression."      My response  "Brandi Love reports daily SOB, chest pains, and dizziness.  She is consuming inadequate calories and protein and any additional exercise pulls needed energy away from her vital organs.  I have discouraged her from any exercise until she eats more appropriately"

## 2017-04-01 ENCOUNTER — Ambulatory Visit: Payer: Self-pay | Admitting: *Deleted

## 2017-04-03 ENCOUNTER — Encounter: Payer: Self-pay | Admitting: *Deleted

## 2017-04-03 ENCOUNTER — Encounter: Payer: Medicare Other | Admitting: *Deleted

## 2017-04-03 DIAGNOSIS — F509 Eating disorder, unspecified: Secondary | ICD-10-CM

## 2017-04-03 DIAGNOSIS — Z713 Dietary counseling and surveillance: Secondary | ICD-10-CM | POA: Diagnosis not present

## 2017-04-03 NOTE — Progress Notes (Signed)
Appointment start time: 1700 Appointment end time: 1800  Patient was seen on 04/03/17 for nutrition counseling pertaining to disordered eating  Primary care provider: Molli Barrows  Therapist: NA   Assessment Is waiting to hear back from [facility] about care team moving forward.  Did not reschedule with Gwen after missing Monday due to the weather Reports to me that she is struggling, but Dora and ACT team report that she is fine.  There are conflicting stories  Is engaging in behaviors to decrease appetite.  Chewing gum to eat less to lose weight  Dizzy all the time; tripping and stumbling as she walks, she reports Headaches every day less nausea  Sometimes sees spots Chest pain all the time Short of breath Constipated.  2 colace twice a day bruises easily. Stumbling/triping - Pam noticed that (nurse)   read into to [facility], [facility]  Mental health diagnosis: OSFED (atypical AN)  Dietary assessment: Safe foods include: salad, healthy choice steamers, leftovers, hard boiled eggs, english muffins, toast, egg rolls fear foods include:everything else  24 hour recall:  Gingersnaps, hershey kisses gatorade Chicken, green beans, couscous, egg, chips/dip   Estimated energy intake: 1000 kcal  Estimated energy needs: 2200 kcal 275 g CHO 110 g pro 73 g fat  Nutrition Diagnosis: NI-1.4 Inadequate energy intake As related to restrictive eating disorder.  As evidenced by dietary recall.  Intervention/Goals:  Body is not strong or nourished enough.  She is eating, but not enough.  She knows she isn't eating enough.    [place] ED.  Reiterated recovery messages . Needs more care than I can provide   1. Read [facility]/journal daily 2. Watch [facility] again 3. Fill out project heal application 4. Eat 3 meals a day or Ensure Plus 5. Drink 3 cups water/day 6. No more laxatives/use Miralax instead     Monitoring and Evaluation: Patient will follow up in 1 weeks.

## 2017-04-10 ENCOUNTER — Encounter: Payer: Medicare Other | Admitting: *Deleted

## 2017-04-10 DIAGNOSIS — Z713 Dietary counseling and surveillance: Secondary | ICD-10-CM | POA: Diagnosis not present

## 2017-04-10 DIAGNOSIS — F509 Eating disorder, unspecified: Secondary | ICD-10-CM

## 2017-04-10 NOTE — Progress Notes (Signed)
Appointment start time: 1030 Appointment end time: 1130  Patient was seen on 04/10/17 for nutrition counseling pertaining to disordered eating  Primary care provider: Molli Barrows  Therapist: NA   Assessment appears very tired in session.  Not able to stan or walk regularly.  Head down in the lobby Doesn't feel good.  "everything is wrong" Dizzy, shaky, disoriented, nauseated, in pain Tripping/stumbling, forgetful, hard to focus, feels like she is going to collapse  Going to her parents tomorrow for 10 days.  Mom refuses to allow her to skip meals Breakfast is eggs or pancakes and sausage Lunch is leftovers or goes out to get sandwich with fries Dinner is chicken with starch (but not often as mom is low-carb), vegeable  Mental health diagnosis: OSFED (atypical AN)  Dietary assessment: Safe foods include: salad, healthy choice steamers, leftovers, hard boiled eggs, english muffins, toast, egg rolls fear foods include:everything else  24 hour recall:  8 hershey's minis, banana nut muffin, 1 egg, rotisserie chicken 32 oz Orange gatorade Ensure Plus, pineapple.  Some water Cafe Steamer, banana, water water  Estimated energy intake: <1000 kcal  Estimated energy needs: 2200 kcal 275 g CHO 110 g pro 73 g fat  Nutrition Diagnosis: NI-1.4 Inadequate energy intake As related to restrictive eating disorder.  As evidenced by dietary recall.  Intervention/Goals:  Body is not strong or nourished enough.  She is eating, but not enough.  She knows she isn't eating enough.    [place] [facility].  Reiterated recovery messages . Needs more care than I can provide.  I have communicated with her ACT team that she needs residential treatment  3 meals and 3 snacks every day Leeta is SEVERELY malnourished.  She is having chest pains, she is dizzy, she can barely stand.  She should be in a residential facility for her eating disorder, but her insurance will not cover it.  She DESPERATELY needs  improved nutrition and more food   Breakfast of starch, protein, fat, with 2% milk (or whole) or juice  Pancakes with eggs or sausage Lunch of starch, protein, and fat with milk or juice  Sandwich with cheese, meat, and mayo.  Chips, fruit, and cookie  Leftovers of meat, starch, vegetable, and fat Dinner of starch, meat, vegetable with milk or juice  3 snacks of Ensure Plus or nuts, ice cream, cheese and crackers  Adequate fluids in the form of water and/or Gatorade I am NOT concerned about gaining weight.  She NEEDS to gain weight.  Her healthy weight is higher than she is now.  Her body doesn't function unless it's a higher weight.  She is starving currently     Monitoring and Evaluation: Patient will follow up in 2 weeks.

## 2017-04-10 NOTE — Patient Instructions (Signed)
3 meals and 3 snacks every day Kanae is SEVERELY malnourished.  She is having chest pains, she is dizzy, she can barely stand.  She should be in a residential facility for her eating disorder, but her insurance will not cover it.  She DESPERATELY needs improved nutrition and more food   Breakfast of starch, protein, fat, with 2% milk (or whole) or juice  Pancakes with eggs or sausage Lunch of starch, protein, and fat with milk or juice  Sandwich with cheese, meat, and mayo.  Chips, fruit, and cookie  Leftovers of meat, starch, vegetable, and fat Dinner of starch, meat, vegetable with milk or juice  3 snacks of Ensure Plus or nuts, ice cream, cheese and crackers  Adequate fluids in the form of water and/or Gatorade I am NOT concerned about gaining weight.  She NEEDS to gain weight.  Her healthy weight is higher than she is now.  Her body doesn't function unless it's a higher weight.  She is starving currently

## 2017-04-24 ENCOUNTER — Encounter: Payer: Medicare Other | Attending: Family Medicine | Admitting: *Deleted

## 2017-04-24 DIAGNOSIS — Z713 Dietary counseling and surveillance: Secondary | ICD-10-CM | POA: Insufficient documentation

## 2017-04-24 DIAGNOSIS — F509 Eating disorder, unspecified: Secondary | ICD-10-CM

## 2017-04-24 DIAGNOSIS — N183 Chronic kidney disease, stage 3 (moderate): Secondary | ICD-10-CM | POA: Diagnosis not present

## 2017-04-24 DIAGNOSIS — F5000 Anorexia nervosa, unspecified: Secondary | ICD-10-CM | POA: Diagnosis not present

## 2017-04-24 DIAGNOSIS — E1122 Type 2 diabetes mellitus with diabetic chronic kidney disease: Secondary | ICD-10-CM | POA: Diagnosis not present

## 2017-04-24 NOTE — Patient Instructions (Addendum)
Take 4 capfuls of Miralax with 32 oz fluid.  Drink over 4-6 hours Continue with 1 capful Miralax with 8 oz fluid in the morning after you have been cleaned out. Stop Colace!!!    B: banana nut muffin with butter, whole milk 1 cup, 5 sausage links, 1 cup water S: Ensure L: PB and J sandwich, full fat yogurt, apple juice, chips S: Ensure D: rotisserie chicken (size of hand), couscous (size of fist), green beans, 1 cup whole milk S: Ensure

## 2017-04-24 NOTE — Progress Notes (Signed)
Appointment start time: 1500 Appointment end time: 1600  Patient was seen on 04/24/17 for nutrition counseling pertaining to disordered eating  Primary care provider: Molli Barrows  Therapist: NA   Assessment Trouble falling asleep.  Takes hours to fall asleep.  Taking 3 mg Melatonin Poor energy nauseated all the time.  No vomiting Chest pain all the time.  SOB all the time, especially with movement Dizziness all the time, especially with movement BM every other day.  Strain. appt with Cherie Ouch 04/29/17  Weight up 2 pounds.  Ate better at parent's.  Didn't feel any better physically because she still wasn't eating enough.  Started restricting again when she came back home   working on [place] health diagnosis: OSFED (atypical AN)  Dietary assessment: Safe foods include: salad, healthy choice steamers, leftovers, hard boiled eggs, english muffins, toast, egg rolls fear foods include:everything else  24 hour recall:  B: banana nut muffin, 2 nutty buddies, apple juice S: little bites, banana nut muffin, milk L: panera salad with dierra mist S: bread, cookie, water D: cafe steamer, banana water   Estimated energy intake: 1800 kcal  Estimated energy needs: 2200 kcal 275 g CHO 110 g pro 73 g fat  Nutrition Diagnosis: NI-1.4 Inadequate energy intake As related to restrictive eating disorder.  As evidenced by dietary recall.  Intervention/Goals:  Body is not strong or nourished enough.  She is eating, but not enough.  She knows she isn't eating enough.    [place] ED.  Reiterated recovery messages . Needs more care than I can provide.  I have communicated with her ACT team that she needs residential treatment  3 meals and 3 snacks every day Dottie is SEVERELY malnourished.  She is having chest pains, she is dizzy, she can barely stand.  She should be in a residential facility for her eating disorder, but her insurance will not cover it.  She DESPERATELY needs  improved nutrition and more food   Take 4 capfuls of Miralax with 32 oz fluid.  Drink over 4-6 hours Continue with 1 capful Miralax with 8 oz fluid in the morning after you have been cleaned out. Stop Colace!!!    B: banana nut muffin with butter, whole milk 1 cup, 5 sausage links, 1 cup water S: Ensure L: PB and J sandwich, full fat yogurt, apple juice, chips S: Ensure D: rotisserie chicken (size of hand), couscous (size of fist), green beans, 1 cup whole milk S: Ensure  Adequate fluids in the form of water and/or Gatorade I am NOT concerned about gaining weight.  She NEEDS to gain weight.  Her healthy weight is higher than she is now.  Her body doesn't function unless it's a higher weight.       Monitoring and Evaluation: Patient will follow up in 4 weeks [place]

## 2017-04-28 ENCOUNTER — Encounter (HOSPITAL_COMMUNITY): Payer: Self-pay | Admitting: Emergency Medicine

## 2017-04-28 ENCOUNTER — Other Ambulatory Visit: Payer: Self-pay

## 2017-04-28 DIAGNOSIS — I129 Hypertensive chronic kidney disease with stage 1 through stage 4 chronic kidney disease, or unspecified chronic kidney disease: Secondary | ICD-10-CM | POA: Diagnosis not present

## 2017-04-28 DIAGNOSIS — R0789 Other chest pain: Secondary | ICD-10-CM | POA: Insufficient documentation

## 2017-04-28 DIAGNOSIS — M7918 Myalgia, other site: Secondary | ICD-10-CM | POA: Diagnosis not present

## 2017-04-28 DIAGNOSIS — Z79899 Other long term (current) drug therapy: Secondary | ICD-10-CM | POA: Diagnosis not present

## 2017-04-28 DIAGNOSIS — E039 Hypothyroidism, unspecified: Secondary | ICD-10-CM | POA: Diagnosis not present

## 2017-04-28 DIAGNOSIS — R51 Headache: Secondary | ICD-10-CM | POA: Insufficient documentation

## 2017-04-28 DIAGNOSIS — R0602 Shortness of breath: Secondary | ICD-10-CM | POA: Diagnosis not present

## 2017-04-28 DIAGNOSIS — N183 Chronic kidney disease, stage 3 (moderate): Secondary | ICD-10-CM | POA: Diagnosis not present

## 2017-04-28 DIAGNOSIS — E1122 Type 2 diabetes mellitus with diabetic chronic kidney disease: Secondary | ICD-10-CM | POA: Diagnosis not present

## 2017-04-28 DIAGNOSIS — D631 Anemia in chronic kidney disease: Secondary | ICD-10-CM | POA: Diagnosis not present

## 2017-04-28 DIAGNOSIS — G8929 Other chronic pain: Secondary | ICD-10-CM | POA: Diagnosis not present

## 2017-04-28 DIAGNOSIS — R079 Chest pain, unspecified: Secondary | ICD-10-CM | POA: Diagnosis present

## 2017-04-28 LAB — CBC
HCT: 40.3 % (ref 36.0–46.0)
HEMOGLOBIN: 13.3 g/dL (ref 12.0–15.0)
MCH: 30.9 pg (ref 26.0–34.0)
MCHC: 33 g/dL (ref 30.0–36.0)
MCV: 93.5 fL (ref 78.0–100.0)
Platelets: 260 10*3/uL (ref 150–400)
RBC: 4.31 MIL/uL (ref 3.87–5.11)
RDW: 13.2 % (ref 11.5–15.5)
WBC: 6.7 10*3/uL (ref 4.0–10.5)

## 2017-04-28 LAB — URINALYSIS, ROUTINE W REFLEX MICROSCOPIC
Bilirubin Urine: NEGATIVE
Glucose, UA: NEGATIVE mg/dL
Hgb urine dipstick: NEGATIVE
Ketones, ur: NEGATIVE mg/dL
LEUKOCYTES UA: NEGATIVE
NITRITE: NEGATIVE
Protein, ur: NEGATIVE mg/dL
SPECIFIC GRAVITY, URINE: 1.014 (ref 1.005–1.030)
pH: 5 (ref 5.0–8.0)

## 2017-04-28 LAB — BASIC METABOLIC PANEL
ANION GAP: 9 (ref 5–15)
BUN: 45 mg/dL — ABNORMAL HIGH (ref 6–20)
CO2: 22 mmol/L (ref 22–32)
Calcium: 10.6 mg/dL — ABNORMAL HIGH (ref 8.9–10.3)
Chloride: 107 mmol/L (ref 101–111)
Creatinine, Ser: 1.93 mg/dL — ABNORMAL HIGH (ref 0.44–1.00)
GFR, EST AFRICAN AMERICAN: 37 mL/min — AB (ref >60)
GFR, EST NON AFRICAN AMERICAN: 32 mL/min — AB (ref >60)
GLUCOSE: 144 mg/dL — AB (ref 65–99)
POTASSIUM: 5.1 mmol/L (ref 3.5–5.1)
Sodium: 138 mmol/L (ref 135–145)

## 2017-04-28 NOTE — ED Triage Notes (Signed)
Pt. Is deaf read lips. Pain in my chest and Im dizzy with a headache and I hurt all over.  They said Im malnourished and want to put a feeding tube in.. Im just hurting all over.

## 2017-04-28 NOTE — ED Triage Notes (Signed)
Pt. Stated, I have an eating disorder and my diet ian  she is out of town

## 2017-04-29 ENCOUNTER — Emergency Department (HOSPITAL_COMMUNITY): Payer: Medicare Other

## 2017-04-29 ENCOUNTER — Emergency Department (HOSPITAL_COMMUNITY)
Admission: EM | Admit: 2017-04-29 | Discharge: 2017-04-29 | Disposition: A | Discharge status: Home / Self Care | Payer: Medicare Other | Attending: Emergency Medicine | Admitting: Emergency Medicine

## 2017-04-29 DIAGNOSIS — R0789 Other chest pain: Secondary | ICD-10-CM | POA: Diagnosis not present

## 2017-04-29 LAB — I-STAT TROPONIN, ED: TROPONIN I, POC: 0 ng/mL (ref 0.00–0.08)

## 2017-04-29 LAB — D-DIMER, QUANTITATIVE: D-Dimer, Quant: <0.27 ug/mL-FEU (ref 0.00–0.50)

## 2017-04-29 MED ORDER — ACETAMINOPHEN 325 MG PO TABS
650.0000 mg | ORAL_TABLET | Freq: Once | ORAL | Status: DC
  Filled 2017-04-29: qty 2

## 2017-04-29 MED ORDER — ONDANSETRON HCL 4 MG/2ML IJ SOLN
4.0000 mg | Freq: Once | INTRAMUSCULAR | Status: AC
  Administered 2017-04-29: 4 mg via INTRAVENOUS
  Filled 2017-04-29: qty 2

## 2017-04-29 MED ORDER — SODIUM CHLORIDE 0.9 % IV BOLUS (SEPSIS): 500.0000 mL | Freq: Once | INTRAVENOUS | Status: DC

## 2017-04-29 MED ORDER — SODIUM CHLORIDE 0.9 % IV BOLUS (SEPSIS)
1000.0000 mL | Freq: Once | INTRAVENOUS | Status: AC
  Administered 2017-04-29: 1000 mL via INTRAVENOUS

## 2017-04-29 NOTE — ED Notes (Signed)
Pt to nurse first regarding wait time. Pt notified about wait time.

## 2017-04-29 NOTE — ED Provider Notes (Signed)
[facility] EMERGENCY DEPARTMENT Provider Note   CSN: [id number] Arrival date & time: 04/28/17  1706     History   Chief Complaint Chief Complaint  Patient presents with  . Chest Pain  . Dizziness  . Generalized Body Aches    HPI Brandi Love is a 42 y.o. female with a past medical history of deafness, GERD, HLD, seizures, prior PE in 2003, depression, anxiety, CKD, eating disorder, who presents to ED for evaluation of generalized bodyaches, chest pain, shortness of breath, nausea, headache, fatigue, constipation, decreased appetite for the past 3 days. She has been seen and evaluated by her dietician last week for similar symptoms. She is here because she has been having worsening of her chest pain. She states that the chest pain is located in the middle of her chest and has been a constant pressure sensation.  She also reports shortness of breath with walking short distances.  Unsure if this feels like her prior PE.  She is no longer on any blood thinners.  She also states that "I am here because my doctor tells me that I am severely malnourished."  HPI  Past Medical History:  Diagnosis Date  . Anemia   . Anxiety   . Arthritis    "hands" (09/18/2016)  . Chronic lower back pain   . Chronic renal insufficiency   . Deafness   . Depression   . Diabetes ([place])    "borderline"  . Eating disorder   . Endometriosis   . GERD (gastroesophageal reflux disease)   . History of kidney stones   . Hyperlipidemia   . Hypertension   . 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 Migraine    "frequency varies" (09/18/2016)  . OCD (obsessive compulsive disorder)   . Pneumonia 2011  . PONV (postoperative nausea and vomiting)   . Pulmonary embolism ([place]) 10/2001  . Seizures ([place]) 09/2001 X 1   "day after my hysterectomy"  . Tachycardia     Patient Active Problem List   Diagnosis Date Noted  . Urinary tract infection with hematuria   . OCD (obsessive  compulsive disorder) 09/29/2016  . Eating disorder 09/29/2016  . Deafness congenital 09/29/2016  . Intractable nausea and vomiting 09/29/2016  . Bloody diarrhea 09/29/2016  . AKI (acute kidney injury) ([place]) 09/29/2016  . Severe dehydration 09/29/2016  . Severe protein-calorie malnutrition ([place]) 09/29/2016  . Congenital hypothyroidism 09/29/2016  . Hypertriglyceridemia 09/29/2016  . Anemia 09/29/2016  . Endometriosis 09/29/2016  . MDD (major depressive disorder) 09/29/2016  . Hyperlipidemia 09/29/2016  . CKD (chronic kidney disease), stage III ([place]) 09/29/2016  . Acute kidney injury ([place]) 09/29/2016  . OAB (overactive bladder) 09/29/2016  . Sepsis ([place]) 09/18/2016  . Acute pyelonephritis 09/18/2016  . MDD (major depressive disorder), recurrent, severe, with psychosis ([place]) 12/11/2014  . Eating disorder, unspecified 11/23/2012  . Hypothyroidism 11/23/2012  . Dyslipidemia 11/23/2012  . Sinus tachycardia 11/23/2012  . CKD (chronic kidney disease) stage 3, GFR 30-59 ml/min ([facility]) 11/18/2012  . Protein-calorie malnutrition, severe ([place]) 11/13/2012  . Abdominal pain, epigastric 11/12/2012  . Dehydration 11/12/2012  . Acute on chronic renal failure ([place]) 11/12/2012  . Leukocytosis, unspecified 11/12/2012  . Hyponatremia 11/12/2012  . Borderline personality disorder ([place]) 11/12/2012    Past Surgical History:  Procedure Laterality Date  . ABDOMINAL HYSTERECTOMY  09/2001   'except for right ovary'  . BREAST LUMPECTOMY Left 2000   benign  . BREAST LUMPECTOMY Right 2002  benign  . COCHLEAR IMPLANT Bilateral 2004-2010   "right-left"  . COLONOSCOPY  06/2015    Dr Rolm Bookbinder at [facility] for rectal bleeding: internal hemorrhoids, small anal fissure.    . ESOPHAGOGASTRODUODENOSCOPY N/A 10/07/2016   Procedure: ESOPHAGOGASTRODUODENOSCOPY (EGD);  Surgeon: Ladene Artist, MD;  Location: [facility] ENDOSCOPY;  Service: Endoscopy;  Laterality: N/A;  . EYE MUSCLE SURGERY Left 1980   "lazy eye"    . EYE MUSCLE SURGERY Right 1990   "lazy eye"  . LAPAROSCOPIC ABDOMINAL EXPLORATION  2001 and 2002   for endometriosis    OB History    No data available       Home Medications    Prior to Admission medications   Medication Sig Start Date End Date Taking? Authorizing Provider  aspirin 81 MG chewable tablet Chew by mouth daily.    [provider]  atorvastatin (LIPITOR) 20 MG tablet Take 20 mg by mouth daily.    [provider]  docusate sodium (COLACE) 100 MG capsule Take 100 mg by mouth 2 (two) times daily.    [provider]  feeding supplement (BOOST / RESOURCE BREEZE) LIQD Take 1 Container by mouth 3 (three) times daily between meals. Patient not taking: Reported on 11/30/2016 10/11/16   Elgergawy, Silver Huguenin, MD  feeding supplement (BOOST / RESOURCE BREEZE) LIQD Take 1 Container by mouth 3 (three) times daily between meals. Patient not taking: Reported on 11/30/2016 10/11/16   Elgergawy, Silver Huguenin, MD  haloperidol (HALDOL) 5 MG tablet Take 5 mg by mouth at bedtime.     [provider]  hydrOXYzine (ATARAX/VISTARIL) 50 MG tablet Take 50 mg 3 (three) times daily as needed by mouth.    [provider]  lamoTRIgine (LAMICTAL) 200 MG tablet Take 200 mg by mouth daily.    [provider]  levothyroxine (SYNTHROID, LEVOTHROID) 150 MCG tablet Take 150 mcg by mouth daily.    [provider]  lurasidone (LATUDA) 20 MG TABS tablet Take 20 mg by mouth daily.     [provider]  magnesium oxide (MAG-OX) 400 MG tablet Take 400 mg daily by mouth.    [provider]  Magnesium Oxide 400 (240 Mg) MG TABS Take by mouth.    [provider]  Multiple Vitamin (MULTIVITAMIN WITH MINERALS) TABS Take 1 tablet by mouth daily.    [provider]  Nutritional Supplements (FEEDING SUPPLEMENT, OSMOLITE 1.2 CAL,) LIQD Place 1,000 mLs into feeding tube continuous. Patient on nocturnal tube feeds from 7 PM to 7 AM at 40  mL per hour via core track Patient not taking: Reported on 11/30/2016 10/11/16   Elgergawy, Silver Huguenin, MD  ondansetron (ZOFRAN) 4 MG tablet Take 4 mg by mouth every 8 (eight) hours as needed for nausea.    [provider]  pantoprazole (PROTONIX) 40 MG tablet Take 40 mg by mouth daily.    [provider]  pregabalin (LYRICA) 50 MG capsule Take 50 mg by mouth 3 (three) times daily.    [provider]  QUEtiapine Fumarate (SEROQUEL XR) 150 MG 24 hr tablet Take 150 mg by mouth at bedtime.    [provider]  topiramate (TOPAMAX) 50 MG tablet Take 50 mg by mouth 2 (two) times daily.     [provider]  venlafaxine XR (EFFEXOR-XR) 150 MG 24 hr capsule Take 150 mg by mouth daily with breakfast.    [provider]  Vitamin D, Ergocalciferol, (DRISDOL) 50000 UNITS CAPS Take 50,000  Units by mouth every 7 (seven) days. On Sunday    [provider]    Family History Family History  Problem Relation Age of Onset  . Cancer Father   . Hyperlipidemia Father   . Osteoarthritis Mother   . Hyperlipidemia Mother     Social History Social History   Tobacco Use  . Smoking status: Never Smoker  . Smokeless tobacco: Never Used  Substance Use Topics  . Alcohol use: No  . Drug use: No     Allergies   Dhea [nutritional supplements]; Entex lq [phenylephrine-guaifenesin]; Indomethacin; Iodinated diagnostic agents; Melatonin; Propoxyphene; Risperidone and related; Shellfish allergy; Depakote [divalproex sodium]; Abilify [aripiprazole]; Dihydroergotamine; Doxycycline; Erythromycin; Lactose intolerance (gi); Nsaids; and Tape   Review of Systems Review of Systems  Constitutional: Positive for appetite change and fatigue. Negative for chills and fever.  HENT: Negative for ear pain, rhinorrhea, sneezing and sore throat.   Eyes: Negative for photophobia and visual disturbance.  Respiratory: Positive for shortness of breath. Negative for cough, chest  tightness and wheezing.   Cardiovascular: Positive for chest pain. Negative for palpitations.  Gastrointestinal: Positive for abdominal pain, nausea and vomiting. Negative for blood in stool, constipation and diarrhea.  Genitourinary: Positive for dysuria. Negative for hematuria and urgency.  Musculoskeletal: Positive for myalgias.  Skin: Negative for rash.  Neurological: Positive for headaches. Negative for dizziness, weakness and light-headedness.     Physical Exam Updated Vital Signs BP 108/70   Pulse 87   Temp 99.1 F (37.3 C)   Resp 12   Ht 5\' 8"  (1.727 m)   Wt 73.9 kg (163 lb)   SpO2 97%   BMI 24.78 kg/m   Physical Exam  Constitutional: She is oriented to person, place, and time. She appears well-developed and well-nourished. No distress.  Nontoxic appearing and in no acute distress.  Speaking complete sentences without difficulty.  Hearing aids in place.  HENT:  Head: Normocephalic and atraumatic.  Nose: Nose normal.  Eyes: Conjunctivae and EOM are normal. Left eye exhibits no discharge. No scleral icterus.  Neck: Normal range of motion. Neck supple.  Cardiovascular: Regular rhythm, normal heart sounds and intact distal pulses. Tachycardia present. Exam reveals no gallop and no friction rub.  No murmur heard. Pulmonary/Chest: Effort normal and breath sounds normal. No respiratory distress. She exhibits tenderness.    Abdominal: Soft. Bowel sounds are normal. She exhibits no distension. There is tenderness. There is no guarding.    Musculoskeletal: Normal range of motion. She exhibits no edema.  No bilateral lower extremity edema, erythema or calf tenderness noted.  Neurological: She is alert and oriented to person, place, and time. No cranial nerve deficit or sensory deficit. She exhibits normal muscle tone. Coordination normal.  Pupils reactive. No facial asymmetry noted. Cranial nerves appear grossly intact. Sensation intact to light touch on face, BUE and BLE.  Strength 5/5 in BUE and BLE.  Skin: Skin is warm and dry. No rash noted.  Psychiatric: She has a normal mood and affect.  Nursing note and vitals reviewed.    ED Treatments / Results  Labs (all labs ordered are listed, but only abnormal results are displayed) Labs Reviewed  BASIC METABOLIC PANEL - Abnormal; Notable for the following components:      Result Value   Glucose, Bld 144 (*)    BUN 45 (*)    Creatinine, Ser 1.93 (*)    Calcium 10.6 (*)    GFR calc non Af Amer 32 (*)    GFR  calc Af Amer 37 (*)    All other components within normal limits  CBC  URINALYSIS, ROUTINE W REFLEX MICROSCOPIC  D-DIMER, QUANTITATIVE (NOT AT [facility])  I-STAT TROPONIN, ED  I-STAT TROPONIN, ED    EKG  EKG Interpretation  Date/Time:  Monday April 28 2017 18:10:40 EST Ventricular Rate:  119 PR Interval:  166 QRS Duration: 74 QT Interval:  304 QTC Calculation: 427 R Axis:   61 Text Interpretation:  Sinus tachycardia ST & T wave abnormality, consider inferior ischemia Abnormal ECG When compared with ECG of 03/07/2017, No significant change was found Confirmed by Delora Fuel ([id number]) on 04/29/2017 12:19:01 AM       Radiology Dg Chest 2 View  Result Date: 04/29/2017 CLINICAL DATA:  Mid chest pain tonight EXAM: CHEST  2 VIEW COMPARISON:  03/08/2017 FINDINGS: The heart size and mediastinal contours are within normal limits. Both lungs are clear. The visualized skeletal structures are unremarkable. IMPRESSION: No active cardiopulmonary disease. Electronically Signed   By: Ashley Royalty M.D.   On: 04/29/2017 03:26    Procedures Procedures (including critical care time)  Medications Ordered in ED Medications  ondansetron (ZOFRAN) injection 4 mg (4 mg Intravenous Given 04/29/17 0325)  sodium chloride 0.9 % bolus 1,000 mL (0 mLs Intravenous Stopped 04/29/17 0447)  sodium chloride 0.9 % bolus 1,000 mL (1,000 mLs Intravenous New Bag/Given 04/29/17 0521)     Initial Impression / Assessment and Plan / ED  Course  I have reviewed the triage vital signs and the nursing notes.  Pertinent labs & imaging results that were available during my care of the patient were reviewed by me and considered in my medical decision making (see chart for details).     Patient, with a past medical history of deafness, hyperlipidemia, seizures, prior PE in 2003, depression, anxiety, CKD, eating disorder, who presents to ED for evaluation of generalized body aches, chest pain, shortness of breath, nausea, headache, fatigue, constipation and decreased appetite for the past 3 days.  She was told by her dietitian that she was severely malnourished and would ultimately need a feeding tube placed.  She is here because she has been having worsening of her chest pain which is located in the middle of her chest.  On physical exam patient is overall well-appearing.  She has chest tenderness to palpation as well as generalized abdominal tenderness to palpation.  She has no deficits on her neurological exam with the exception of her deafness.  She is afebrile with no history of fever.  She is initially tachycardic to low 100s.  She is satting at 97 to 98% on room air.  BMP shows increase in BUN and creatinine to 1.93 and 45, which appears consistent with her history of CKD.  CBC unremarkable.  Urinalysis with no evidence of UTI or.  Troponin was negative.  D-dimer was obtained to rule out PE due to patient's history and physical exam findings.  This was negative.  Chest x-ray was negative.  EKG with tracing similar to previous with changes noted.  Patient given fluids, Zofran with improvement in her symptoms.  She is concerned because her dietitian is out of the country for several weeks.  She did have a feeding tube in the past which was removed due to her purging.  I told her that reinserting the feeding tube would be best if that is what her dietitian prefers for her.  I did tell her that she should follow-up with her primary care provider  in  the meantime for any further evaluation.  I have low suspicion for cardiac or pulmonary cause of her symptoms and I suspect chest wall pain due to the reproducibility and her negative workup.  Will advised her to take Tylenol as needed, continue her home medications and follow-up with her primary care provider for further evaluation.  Patient appears stable for discharge at this time.  Strict return precautions given.  Final Clinical Impressions(s) / ED Diagnoses   Final diagnoses:  Chest wall pain    ED Discharge Orders    None     Portions of this note were generated with Dragon dictation software. Dictation errors may occur despite best attempts at proofreading.    Delia Heady, PA-C 38/88/28 0034    Delora Fuel, MD 91/79/15 [phone]

## 2017-04-29 NOTE — Discharge Instructions (Signed)
Please read the attached information regarding your condition. Follow-up with your dietitian and primary care provider for further evaluation. Return to ED for worsening symptoms, lightheadedness, loss of consciousness, severe chest pain, shortness of breath or severe abdominal pain.

## 2017-04-29 NOTE — ED Notes (Signed)
PT states understanding of care given, follow up care. PT ambulated from ED to car with a steady gait.  

## 2017-05-28 ENCOUNTER — Encounter: Payer: Medicare Other | Attending: Family Medicine | Admitting: *Deleted

## 2017-05-28 DIAGNOSIS — F5000 Anorexia nervosa, unspecified: Secondary | ICD-10-CM | POA: Insufficient documentation

## 2017-05-28 DIAGNOSIS — E1122 Type 2 diabetes mellitus with diabetic chronic kidney disease: Secondary | ICD-10-CM | POA: Insufficient documentation

## 2017-05-28 DIAGNOSIS — N183 Chronic kidney disease, stage 3 (moderate): Secondary | ICD-10-CM | POA: Insufficient documentation

## 2017-05-28 DIAGNOSIS — Z713 Dietary counseling and surveillance: Secondary | ICD-10-CM | POA: Diagnosis not present

## 2017-05-28 DIAGNOSIS — F509 Eating disorder, unspecified: Secondary | ICD-10-CM

## 2017-05-28 NOTE — Progress Notes (Signed)
Appointment start time: 1500 Appointment end time: 1600  Patient was seen on 05/28/17 for nutrition counseling pertaining to disordered eating  Primary care provider: Molli Barrows  Therapist: Ed Blalock   Assessment ED visit for dehydration 4 weeks ago.  Fighting bronchitis and sinus infection since Still complains of dizziness and chest pain on regular basis Bm daily due to 4 Colace daily Nausea regularly.  No vomiting Headaches daily.  10 Tylenol daily. But doesn't help Sleeps all day.  Goes to bed late.  Takes naps.  Thinks it's her cough syrup making her sleepy Sydell Axon still comes once a week.  Sees [employer] weekly and working on trauma.  Does not do the assigned homework Got frustrated with project heal application Read Hello Me, Goodbye Ed  Diet is void of fiber.  Low in fluids and low in protein.      Mental health diagnosis: OSFED (atypical AN)  Dietary assessment: Safe foods include: salad, healthy choice steamers, leftovers, hard boiled eggs, english muffins, toast, egg rolls fear foods include:everything else  24 hour recall:  42 chips with dip. Apple juice 7 mini powdered donuts.  Grape gatorade couscous (box) 5 scoops ice cream.  Water with miralax    Estimated energy intake: 1800 kcal  Estimated energy needs: 2200 kcal 275 g CHO 110 g pro 73 g fat  Nutrition Diagnosis: NI-1.4 Inadequate energy intake As related to restrictive eating disorder.  As evidenced by dietary recall.  Intervention/Goals:  Aim to eat every 3 hours.  Increase protein.  Increase fiber,  Increase fluids Talk back to Ed.  Limit colace to 2/day    Monitoring and Evaluation: Patient will follow up in 1 weeks

## 2017-06-05 ENCOUNTER — Encounter: Payer: Medicare Other | Admitting: *Deleted

## 2017-06-05 DIAGNOSIS — F509 Eating disorder, unspecified: Secondary | ICD-10-CM

## 2017-06-05 DIAGNOSIS — Z713 Dietary counseling and surveillance: Secondary | ICD-10-CM | POA: Diagnosis not present

## 2017-06-05 NOTE — Progress Notes (Signed)
Appointment start time: 1100 Appointment end time: 1145  Patient was seen on 06/05/17 for nutrition counseling pertaining to disordered eating  Primary care provider: Molli Barrows  Therapist: Ed Blalock   Assessment Had a not great week as she is in a lot of pain. Collapsed this morning.  Felt funny and lost consciousness.  Has appt with PCP next week.  States last EKG in January was normal States she has been manic and bought a bunch of electrolyte flavoring packets.   Has been sleeping a lot.   Is not following treatment team recommendations.  Is not reading recovery books or messages Is taking 3 colace a day and has diarrhea. I strongly encouraged her to decrease (discontinue) colace Didn't go to grocery store and has not been eating adequate protein   Is not drinking enough fluids.  Complains of excessive thirst.  Is not checking glucose.  More than liekly hyperglycemic due to inadequate protein fiber and fluid   Mental health diagnosis: OSFED (atypical AN)  Dietary assessment: Safe foods include: salad, healthy choice steamers, leftovers, hard boiled eggs, english muffins, toast, egg rolls fear foods include:everything else  24 hour recall:  2 pm: 5 mini donuts, 3 handfuls cheetoh.  Lemonade 6 pm apple juice 9 pm: 2 pack ramen noodles, chips and guac 10:30 water   Estimated energy intake: 1200 kcal  Estimated energy needs: 2200 kcal 275 g CHO 110 g pro 73 g fat  Nutrition Diagnosis: NI-1.4 Inadequate energy intake As related to restrictive eating disorder.  As evidenced by dietary recall.  Intervention/Goals:  Aim to eat every 3 hours.  Increase protein.  Increase fiber,  Increase fluids Talk back to Ed.  Limit colace to 2/day I am not sure what to do at this point.  She has not followed recommendation consistently and she is declining.     Monitoring and Evaluation: Patient will follow up in 1 weeks

## 2017-06-10 ENCOUNTER — Ambulatory Visit: Payer: Self-pay | Admitting: *Deleted

## 2017-06-11 DIAGNOSIS — R460 Very low level of personal hygiene: Secondary | ICD-10-CM | POA: Insufficient documentation

## 2017-06-11 DIAGNOSIS — L709 Acne, unspecified: Secondary | ICD-10-CM | POA: Insufficient documentation

## 2017-06-18 ENCOUNTER — Encounter: Payer: Medicare Other | Admitting: *Deleted

## 2017-06-18 DIAGNOSIS — Z713 Dietary counseling and surveillance: Secondary | ICD-10-CM | POA: Diagnosis not present

## 2017-06-18 DIAGNOSIS — F509 Eating disorder, unspecified: Secondary | ICD-10-CM

## 2017-06-18 NOTE — Progress Notes (Signed)
Appointment start time: 1500 Appointment end time: 1600  Patient was seen on 06/18/17 for nutrition counseling pertaining to disordered eating  Primary care provider: Molli Barrows  Therapist: Ed Blalock   Assessment Not feeling well.  Cancelled last week because she wasn't feeling well. Improved her intake somewhat after last session.  Read her recovery messages Started eating baked beans.  Ate some chicken.  Having more dairy products like ice cream Drink gatorade and some water daily Down to 1 colace and miralax/day. Having loose stools.  Trying to decrease down to 0 Still taking a lot of Tylenol Eating disorder behaviors increased after visiting her parents Has been more depressed lately   Therapy every other week   Mental health diagnosis: OSFED (atypical AN)  Dietary assessment: Safe foods include: salad, healthy choice steamers, leftovers, hard boiled eggs, english muffins, toast, egg rolls fear foods include:everything else  24 hour recall:  2 handfuls of cheetohs, whole can bush's baked beans.  Apple juice Grape gatorade 3 scoops ice cream, 2 packets ramen noodles, water   Estimated energy intake: 1200 kcal  Estimated energy needs: 2200 kcal 275 g CHO 110 g pro 73 g fat  Nutrition Diagnosis: NI-1.4 Inadequate energy intake As related to restrictive eating disorder.  As evidenced by dietary recall.  Intervention/Goals:  I will need to decrease her appointments due to my limited schedule availability. To maintain recovery please read recovery messages daily, watch Embrace weekly and use [employer] social media  Eat 3 meals every day with protein, fiber, carbohydrate Drink LOTS Use miralax, if needed but no other laxatives  Monitoring and Evaluation: Patient will follow up in 4 weeks

## 2017-06-18 NOTE — Patient Instructions (Signed)
Http://www.themilitantbaker.com/p/resources.html  https://www.NightlifePreviews.ch Look at instagram every day Read every day Watch embrace at least 1 time a week  Eat 3 meals every day with protein, fiber, carbohydrate Drink LOTS Use miralax, if needed but no other laxatives

## 2017-07-16 ENCOUNTER — Encounter: Payer: Medicare Other | Attending: Family Medicine | Admitting: *Deleted

## 2017-07-16 DIAGNOSIS — N183 Chronic kidney disease, stage 3 (moderate): Secondary | ICD-10-CM | POA: Diagnosis not present

## 2017-07-16 DIAGNOSIS — Z713 Dietary counseling and surveillance: Secondary | ICD-10-CM | POA: Insufficient documentation

## 2017-07-16 DIAGNOSIS — F5000 Anorexia nervosa, unspecified: Secondary | ICD-10-CM | POA: Diagnosis not present

## 2017-07-16 DIAGNOSIS — E1122 Type 2 diabetes mellitus with diabetic chronic kidney disease: Secondary | ICD-10-CM | POA: Diagnosis not present

## 2017-07-16 DIAGNOSIS — F509 Eating disorder, unspecified: Secondary | ICD-10-CM

## 2017-07-16 NOTE — Progress Notes (Signed)
Appointment start time: 1500 Appointment end time: 1600  Patient was seen on 07/16/17 for nutrition counseling pertaining to disordered eating  Primary care provider: Molli Barrows  Therapist: Ed Blalock   Assessment Not feeling well.    Has headache, dizzy "the usual" Tested her glucose in session today and it was 155 mg/dl fasting Had PCP 2 weeks ago and kidney functioning is a lot worse she reports because she isn't eating or hydrating She is not concerned or rather she isn't sure if she's concerned because she can't think clearly  She then states she wants to get better, but isn't taking steps to move forward.  States she is looking at [employer] and reading Stick Figure  SIV once 6 Colace daily and Miralax States she is loosing weight   Mental health diagnosis: OSFED (atypical AN)  Dietary assessment: Safe foods include: salad, healthy choice steamers, leftovers, hard boiled eggs, english muffins, toast, egg rolls fear foods include:everything else  24 hour recall:  Box of whoppers, 2 thin beef jerky sticks bottle pop, chocolate Ensure Plus, orange Gatorade 2 thin beef jerky, orange gatorade 2 small  Cheese sticks, choclate Ensure plus   Estimated energy needs: 2200 kcal 275 g CHO 110 g pro 73 g fat  Nutrition Diagnosis: NI-1.4 Inadequate energy intake As related to restrictive eating disorder.  As evidenced by dietary recall.  Intervention/Goals:  She has not been able to make progress and she is now actively choosing to [place] my recommendations out of anger.  I wish her all the best and I hope she recovers but it is no longer appropriate to continue our therapeutic relationship.  I suggest she see Iver Nestle, RD  Monitoring and Evaluation: patient will not follow up.

## 2017-08-02 ENCOUNTER — Emergency Department (HOSPITAL_COMMUNITY)
Admission: EM | Admit: 2017-08-02 | Discharge: 2017-08-07 | Disposition: A | Discharge status: Other Acute Inpatient Hospital | Payer: Medicare Other | Attending: Emergency Medicine | Admitting: Emergency Medicine

## 2017-08-02 ENCOUNTER — Other Ambulatory Visit (HOSPITAL_COMMUNITY): Payer: Self-pay

## 2017-08-02 ENCOUNTER — Emergency Department (HOSPITAL_COMMUNITY): Payer: Medicare Other

## 2017-08-02 ENCOUNTER — Encounter (HOSPITAL_COMMUNITY): Payer: Self-pay | Admitting: *Deleted

## 2017-08-02 ENCOUNTER — Other Ambulatory Visit: Payer: Self-pay

## 2017-08-02 DIAGNOSIS — X838XXA Intentional self-harm by other specified means, initial encounter: Secondary | ICD-10-CM | POA: Insufficient documentation

## 2017-08-02 DIAGNOSIS — T1491XA Suicide attempt, initial encounter: Secondary | ICD-10-CM | POA: Diagnosis not present

## 2017-08-02 DIAGNOSIS — F332 Major depressive disorder, recurrent severe without psychotic features: Secondary | ICD-10-CM | POA: Diagnosis not present

## 2017-08-02 DIAGNOSIS — F429 Obsessive-compulsive disorder, unspecified: Secondary | ICD-10-CM | POA: Diagnosis not present

## 2017-08-02 DIAGNOSIS — Y92003 Bedroom of unspecified non-institutional (private) residence as the place of occurrence of the external cause: Secondary | ICD-10-CM | POA: Diagnosis not present

## 2017-08-02 DIAGNOSIS — I129 Hypertensive chronic kidney disease with stage 1 through stage 4 chronic kidney disease, or unspecified chronic kidney disease: Secondary | ICD-10-CM | POA: Insufficient documentation

## 2017-08-02 DIAGNOSIS — Z79899 Other long term (current) drug therapy: Secondary | ICD-10-CM | POA: Diagnosis not present

## 2017-08-02 DIAGNOSIS — Y939 Activity, unspecified: Secondary | ICD-10-CM | POA: Diagnosis not present

## 2017-08-02 DIAGNOSIS — R51 Headache: Secondary | ICD-10-CM | POA: Diagnosis not present

## 2017-08-02 DIAGNOSIS — S199XXA Unspecified injury of neck, initial encounter: Secondary | ICD-10-CM | POA: Insufficient documentation

## 2017-08-02 DIAGNOSIS — Y998 Other external cause status: Secondary | ICD-10-CM | POA: Diagnosis not present

## 2017-08-02 DIAGNOSIS — Z7982 Long term (current) use of aspirin: Secondary | ICD-10-CM | POA: Insufficient documentation

## 2017-08-02 DIAGNOSIS — F5001 Anorexia nervosa, restricting type: Secondary | ICD-10-CM | POA: Insufficient documentation

## 2017-08-02 DIAGNOSIS — N189 Chronic kidney disease, unspecified: Secondary | ICD-10-CM | POA: Insufficient documentation

## 2017-08-02 HISTORY — DX: Chronic kidney disease, unspecified: N18.9

## 2017-08-02 HISTORY — DX: Unspecified hearing loss, unspecified ear: H91.90

## 2017-08-02 HISTORY — DX: Anorexia: R63.0

## 2017-08-02 HISTORY — DX: Essential (primary) hypertension: I10

## 2017-08-02 LAB — I-STAT CHEM 8, ED
BUN: 20 mg/dL (ref 6–20)
BUN: 20 mg/dL (ref 6–20)
CALCIUM ION: 1.22 mmol/L (ref 1.15–1.40)
CALCIUM ION: 1.23 mmol/L (ref 1.15–1.40)
CHLORIDE: 117 mmol/L — AB (ref 101–111)
Chloride: 118 mmol/L — ABNORMAL HIGH (ref 101–111)
Creatinine, Ser: 1.5 mg/dL — ABNORMAL HIGH (ref 0.44–1.00)
Creatinine, Ser: 1.5 mg/dL — ABNORMAL HIGH (ref 0.44–1.00)
Glucose, Bld: 132 mg/dL — ABNORMAL HIGH (ref 65–99)
Glucose, Bld: 121 mg/dL — ABNORMAL HIGH (ref 65–99)
HCT: 39 % (ref 36.0–46.0)
HEMATOCRIT: 36 % (ref 36.0–46.0)
HEMOGLOBIN: 12.2 g/dL (ref 12.0–15.0)
Hemoglobin: 13.3 g/dL (ref 12.0–15.0)
POTASSIUM: 5.1 mmol/L (ref 3.5–5.1)
Potassium: 5.1 mmol/L (ref 3.5–5.1)
SODIUM: 141 mmol/L (ref 135–145)
SODIUM: 142 mmol/L (ref 135–145)
TCO2: 17 mmol/L — ABNORMAL LOW (ref 22–32)
TCO2: 17 mmol/L — AB (ref 22–32)

## 2017-08-02 LAB — I-STAT CG4 LACTIC ACID, ED: Lactic Acid, Venous: 2.18 mmol/L (ref 0.5–1.9)

## 2017-08-02 LAB — COMPREHENSIVE METABOLIC PANEL
ALBUMIN: 4 g/dL (ref 3.5–5.0)
ALK PHOS: 71 U/L (ref 38–126)
ALT: 36 U/L (ref 14–54)
ANION GAP: 11 (ref 5–15)
AST: 31 U/L (ref 15–41)
BUN: 20 mg/dL (ref 6–20)
CALCIUM: 9.7 mg/dL (ref 8.9–10.3)
CO2: 16 mmol/L — ABNORMAL LOW (ref 22–32)
Chloride: 111 mmol/L (ref 101–111)
Creatinine, Ser: 1.48 mg/dL — ABNORMAL HIGH (ref 0.44–1.00)
GFR calc Af Amer: 51 mL/min — ABNORMAL LOW (ref >60)
GFR calc non Af Amer: 44 mL/min — ABNORMAL LOW (ref >60)
Glucose, Bld: 128 mg/dL — ABNORMAL HIGH (ref 65–99)
Potassium: 5 mmol/L (ref 3.5–5.1)
Sodium: 138 mmol/L (ref 135–145)
Total Bilirubin: 0.6 mg/dL (ref 0.3–1.2)
Total Protein: 7.1 g/dL (ref 6.5–8.1)

## 2017-08-02 LAB — CBC
HCT: 40.1 % (ref 36.0–46.0)
HEMOGLOBIN: 13.8 g/dL (ref 12.0–15.0)
MCH: 32.2 pg (ref 26.0–34.0)
MCHC: 34.4 g/dL (ref 30.0–36.0)
MCV: 93.5 fL (ref 78.0–100.0)
Platelets: 284 10*3/uL (ref 150–400)
RBC: 4.29 MIL/uL (ref 3.87–5.11)
RDW: 13 % (ref 11.5–15.5)
WBC: 7.1 10*3/uL (ref 4.0–10.5)

## 2017-08-02 LAB — PROTIME-INR
INR: 0.94
PROTHROMBIN TIME: 12.5 s (ref 11.4–15.2)

## 2017-08-02 LAB — SAMPLE TO BLOOD BANK

## 2017-08-02 LAB — CDS SEROLOGY

## 2017-08-02 LAB — ACETAMINOPHEN LEVEL

## 2017-08-02 LAB — ETHANOL

## 2017-08-02 LAB — SALICYLATE LEVEL: Salicylate Lvl: <7 mg/dL (ref 2.8–30.0)

## 2017-08-02 MED ORDER — DOCUSATE SODIUM 100 MG PO CAPS
100.0000 mg | ORAL_CAPSULE | Freq: Two times a day (BID) | ORAL | Status: DC
  Administered 2017-08-04 – 2017-08-05 (×3): 100 mg via ORAL
  Filled 2017-08-02 (×4): qty 1

## 2017-08-02 MED ORDER — LAMOTRIGINE 100 MG PO TABS
200.0000 mg | ORAL_TABLET | Freq: Every day | ORAL | Status: DC
  Administered 2017-08-03 – 2017-08-06 (×4): 200 mg via ORAL
  Filled 2017-08-02 (×4): qty 2

## 2017-08-02 MED ORDER — ASPIRIN EC 81 MG PO TBEC
81.0000 mg | DELAYED_RELEASE_TABLET | Freq: Every day | ORAL | Status: DC
  Administered 2017-08-03 – 2017-08-07 (×5): 81 mg via ORAL
  Filled 2017-08-02 (×6): qty 1

## 2017-08-02 MED ORDER — FLUTICASONE PROPIONATE 50 MCG/ACT NA SUSP
2.0000 | Freq: Every day | NASAL | Status: DC
  Filled 2017-08-02: qty 16

## 2017-08-02 MED ORDER — SODIUM CHLORIDE 0.9 % IV BOLUS: 1000.0000 mL | Freq: Once | INTRAVENOUS | Status: DC

## 2017-08-02 MED ORDER — PREGABALIN 50 MG PO CAPS
50.0000 mg | ORAL_CAPSULE | Freq: Three times a day (TID) | ORAL | Status: DC
  Administered 2017-08-03 – 2017-08-07 (×11): 50 mg via ORAL
  Filled 2017-08-02 (×11): qty 1
  Filled 2017-08-02: qty 2

## 2017-08-02 MED ORDER — PANTOPRAZOLE SODIUM 40 MG PO TBEC
40.0000 mg | DELAYED_RELEASE_TABLET | Freq: Every day | ORAL | Status: DC
  Administered 2017-08-03 – 2017-08-07 (×5): 40 mg via ORAL
  Filled 2017-08-02 (×6): qty 1

## 2017-08-02 MED ORDER — TOPIRAMATE 25 MG PO TABS
50.0000 mg | ORAL_TABLET | Freq: Two times a day (BID) | ORAL | Status: DC
  Administered 2017-08-03 – 2017-08-05 (×6): 50 mg via ORAL
  Filled 2017-08-02 (×7): qty 2

## 2017-08-02 MED ORDER — LEVOTHYROXINE SODIUM 75 MCG PO TABS
150.0000 ug | ORAL_TABLET | Freq: Every day | ORAL | Status: DC
  Administered 2017-08-03 – 2017-08-07 (×5): 150 ug via ORAL
  Filled 2017-08-02 (×6): qty 2

## 2017-08-02 MED ORDER — VENLAFAXINE HCL ER 150 MG PO CP24
150.0000 mg | ORAL_CAPSULE | Freq: Every day | ORAL | Status: DC
  Administered 2017-08-03 – 2017-08-07 (×4): 150 mg via ORAL
  Filled 2017-08-02 (×6): qty 1

## 2017-08-02 MED ORDER — CEPHALEXIN 250 MG PO CAPS
1000.0000 mg | ORAL_CAPSULE | Freq: Two times a day (BID) | ORAL | Status: DC
  Administered 2017-08-02 – 2017-08-05 (×6): 1000 mg via ORAL
  Filled 2017-08-02 (×7): qty 4

## 2017-08-02 MED ORDER — LURASIDONE HCL 20 MG PO TABS
20.0000 mg | ORAL_TABLET | Freq: Every day | ORAL | Status: DC
Start: 2017-08-03 — End: 2017-08-07
  Administered 2017-08-03 – 2017-08-07 (×3): 20 mg via ORAL
  Filled 2017-08-02 (×5): qty 1

## 2017-08-02 MED ORDER — LACTATED RINGERS IV BOLUS
1000.0000 mL | Freq: Once | INTRAVENOUS | Status: AC
  Administered 2017-08-02: 1000 mL via INTRAVENOUS

## 2017-08-02 MED ORDER — QUETIAPINE FUMARATE ER 50 MG PO TB24
150.0000 mg | ORAL_TABLET | Freq: Every day | ORAL | Status: DC
  Administered 2017-08-03 – 2017-08-06 (×5): 150 mg via ORAL
  Filled 2017-08-02 (×5): qty 3

## 2017-08-02 NOTE — ED Notes (Signed)
Mri called they will be sending for this pt shortly  No iv  fluiid  Given yet due to the mri study.

## 2017-08-02 NOTE — ED Notes (Signed)
Pt needs tts for psy admission

## 2017-08-02 NOTE — ED Notes (Signed)
Ready for tts  Getting machine

## 2017-08-02 NOTE — ED Notes (Signed)
The p[t is hearing impaired  She has a cochlear implant and she reads lips

## 2017-08-02 NOTE — ED Notes (Signed)
Sitter at the bedside.

## 2017-08-02 NOTE — ED Provider Notes (Signed)
[place] EMERGENCY DEPARTMENT Provider Note   CSN: [id number] Arrival date & time: 08/02/17  1901     History   Chief Complaint Chief Complaint  Patient presents with  . Trauma    HPI Brandi Love is a 42 y.o. female.  The history is provided by the patient and the EMS personnel.  Trauma Mechanism of injury: hanging Injury location: head/neck Injury location detail: neck Arrived directly from scene: yes       Suspicion of alcohol use: no      Suspicion of drug use: no  EMS/PTA data:      Ambulatory at scene: no      Responsiveness: alert      Oriented to: place, person, situation and time      Breathing interventions: none      IV access: established      Immobilization: C-collar      Airway condition since incident: stable      Breathing condition since incident: stable      Circulation condition since incident: stable      Mental status condition since incident: stable      Disability condition since incident: stable  Current symptoms:      Pain scale: 5/10      Pain quality: aching and dull      Associated symptoms:            Denies abdominal pain, back pain, blindness, chest pain, headache, seizures and vomiting.    History reviewed. No pertinent past medical history.  There are no active problems to display for this patient.   History reviewed. No pertinent surgical history.   OB History   None      Home Medications    Prior to Admission medications   Medication Sig Start Date End Date Taking? Authorizing Provider  aspirin EC 81 MG tablet Take 81 mg by mouth daily.   Yes [provider]  cephALEXin (KEFLEX) 500 MG capsule Take 1,000 mg by mouth 2 (two) times daily. 10 day course started 07/28/17 07/28/17  Yes [provider]  fluticasone (FLONASE) 50 MCG/ACT nasal spray Place 2 sprays into both nostrils daily.  05/03/17  Yes [provider]  gemfibrozil (LOPID) 600 MG tablet Take 600 mg by mouth.  07/17/17  Yes [provider]  haloperidol (HALDOL) 5 MG tablet Take 5 mg by mouth. 07/17/17  Yes [provider]  lamoTRIgine (LAMICTAL) 200 MG tablet Take 200 mg by mouth daily. 07/17/17  Yes [provider]  levothyroxine (SYNTHROID, LEVOTHROID) 150 MCG tablet Take 150 mcg by mouth daily. 07/17/17  Yes [provider]  lurasidone (LATUDA) 20 MG TABS tablet Take 20 mg by mouth daily.   Yes [provider]  pantoprazole (PROTONIX) 40 MG tablet Take 40 mg by mouth daily. 07/17/17  Yes [provider]  pregabalin (LYRICA) 50 MG capsule Take 50 mg by mouth 3 (three) times daily.   Yes [provider]  QUEtiapine Fumarate (SEROQUEL XR) 150 MG 24 hr tablet Take 150 mg by mouth at bedtime. 07/17/17  Yes [provider]  topiramate (TOPAMAX) 50 MG tablet Take 50 mg by mouth 2 (two) times daily. 07/17/17  Yes [provider]  venlafaxine XR (EFFEXOR-XR) 150 MG 24 hr capsule Take 150 mg by mouth daily with breakfast. 07/17/17  Yes [provider]  docusate sodium (COLACE) 100 MG capsule Take 100 mg by mouth 2 (two) times daily.    [provider]  Multiple Vitamin (MULTIVITAMIN WITH MINERALS) TABS tablet Take 1 tablet by mouth daily.    [provider]  Vitamin D, Ergocalciferol, (DRISDOL) 50000 units CAPS capsule Take 50,000 Units by mouth every Sunday.    [provider]    Family History No family history on file.  Social History Social History   Tobacco Use  . Smoking status: Never Smoker  . Smokeless tobacco: Never Used  Substance Use Topics  . Alcohol use: Never    Frequency: Never  . Drug use: Never     Allergies   Iodinated diagnostic agents; Phenylephrine-guaifenesin; Propoxyphene; Abilify [aripiprazole]; Dihydroergotamine; Divalproex sodium; Doxycycline; Erythromycin; Indomethacin; Lactose intolerance (gi); Melatonin; Nsaids; Omega 3 [dha-epa-vitamin e]; Prasterone (dhea)  [dhea]; Risperidone and related; Shellfish allergy; Statins; and Tape   Review of Systems Review of Systems  Constitutional: Negative for chills and fever.  HENT: Negative for ear pain and sore throat.   Eyes: Negative for blindness, pain and visual disturbance.  Respiratory: Negative for cough and shortness of breath.   Cardiovascular: Negative for chest pain and palpitations.  Gastrointestinal: Negative for abdominal pain and vomiting.  Genitourinary: Negative for dysuria and hematuria.  Musculoskeletal: Negative for arthralgias and back pain.  Skin: Negative for color change and rash.  Neurological: Negative for seizures, syncope and headaches.  Psychiatric/Behavioral: Positive for suicidal ideas.  All other systems reviewed and are negative.    Physical Exam Updated Vital Signs  ED Triage Vitals  Enc Vitals Group     BP 08/02/17 1904 132/82     Pulse Rate 08/02/17 1904 (!) 113     Resp 08/02/17 1904 20     Temp 08/02/17 1904 98.3 F (36.8 C)     Temp src --      SpO2 08/02/17 1904 100 %     Weight 08/02/17 1926 165 lb (74.8 kg)     Height 08/02/17 1926 5\' 8"  (1.727 m)     Head Circumference --      Peak Flow --      Pain Score 08/02/17 1926 5     Pain Loc --      Pain Edu? --      Excl. in [place]? --     Physical Exam  Constitutional: She appears well-developed and well-nourished. No distress.  HENT:  Head: Normocephalic and atraumatic.  Eyes: Conjunctivae are normal.  Neck: Neck supple.  TTP in midline c spine  Cardiovascular: Normal rate and regular rhythm.  No murmur heard. Pulmonary/Chest: Effort normal and breath sounds normal. No respiratory distress.  Abdominal: Soft. There is no tenderness.  Musculoskeletal: She exhibits no edema or tenderness.  Neurological: She is alert.  Skin: Skin is warm and dry. Capillary refill takes less than 2 seconds.  Psychiatric: She expresses impulsivity. She expresses suicidal ideation. She expresses suicidal plans.    Nursing note and vitals reviewed.    ED Treatments / Results  Labs (all labs ordered are listed, but only abnormal results are displayed) Labs Reviewed  COMPREHENSIVE METABOLIC PANEL - Abnormal; Notable for the following components:      Result Value   CO2 16 (*)    Glucose, Bld 128 (*)    Creatinine, Ser 1.48 (*)    GFR calc non Af Amer 44 (*)    GFR calc Af Amer 51 (*)    All other components within normal limits  ACETAMINOPHEN LEVEL - Abnormal; Notable for the following components:   Acetaminophen (Tylenol), Serum <10 (*)  All other components within normal limits  I-STAT CHEM 8, ED - Abnormal; Notable for the following components:   Chloride 117 (*)    Creatinine, Ser 1.50 (*)    Glucose, Bld 132 (*)    TCO2 17 (*)    All other components within normal limits  I-STAT CG4 LACTIC ACID, ED - Abnormal; Notable for the following components:   Lactic Acid, Venous 2.18 (*)    All other components within normal limits  I-STAT CHEM 8, ED - Abnormal; Notable for the following components:   Chloride 118 (*)    Creatinine, Ser 1.50 (*)    Glucose, Bld 121 (*)    TCO2 17 (*)    All other components within normal limits  CDS SEROLOGY  CBC  ETHANOL  PROTIME-INR  SALICYLATE LEVEL  URINALYSIS, ROUTINE W REFLEX MICROSCOPIC  RAPID URINE DRUG SCREEN, HOSP PERFORMED  I-STAT BETA HCG BLOOD, ED ([facility], [facility], [facility] ONLY)  SAMPLE TO BLOOD BANK    EKG None  Radiology Ct Head Wo Contrast  Result Date: 08/02/2017 CLINICAL DATA:  Neck trauma, attempted suicide EXAM: CT HEAD WITHOUT CONTRAST CT CERVICAL SPINE WITHOUT CONTRAST TECHNIQUE: Multidetector CT imaging of the head and cervical spine was performed following the standard protocol without intravenous contrast. Multiplanar CT image reconstructions of the cervical spine were also generated. COMPARISON:  None. FINDINGS: CT HEAD FINDINGS Brain: No evidence of acute infarction, hemorrhage, hydrocephalus, extra-axial collection or mass  lesion/mass effect. Vascular: No hyperdense vessel or unexpected calcification. Skull: Normal. Negative for fracture or focal lesion. Sinuses/Orbits: The visualized paranasal sinuses are essentially clear. The mastoid air cells are unopacified. Prior partial right mastoidectomy. Bilateral cochlear implants. Other: None. CT CERVICAL SPINE FINDINGS Alignment: Normal cervical lordosis. Skull base and vertebrae: No acute fracture. No primary bone lesion or focal pathologic process. Soft tissues and spinal canal: No prevertebral fluid or swelling. No visible canal hematoma. Disc levels: Mild degenerative changes at C5-6. Spinal canal is patent. Upper chest: Visualized lung apices are clear. Other: None. IMPRESSION: Normal head CT. No evidence of traumatic injury to the cervical spine. Electronically Signed   By: Julian Hy M.D.   On: 08/02/2017 20:54   Ct Cervical Spine Wo Contrast  Result Date: 08/02/2017 CLINICAL DATA:  Neck trauma, attempted suicide EXAM: CT HEAD WITHOUT CONTRAST CT CERVICAL SPINE WITHOUT CONTRAST TECHNIQUE: Multidetector CT imaging of the head and cervical spine was performed following the standard protocol without intravenous contrast. Multiplanar CT image reconstructions of the cervical spine were also generated. COMPARISON:  None. FINDINGS: CT HEAD FINDINGS Brain: No evidence of acute infarction, hemorrhage, hydrocephalus, extra-axial collection or mass lesion/mass effect. Vascular: No hyperdense vessel or unexpected calcification. Skull: Normal. Negative for fracture or focal lesion. Sinuses/Orbits: The visualized paranasal sinuses are essentially clear. The mastoid air cells are unopacified. Prior partial right mastoidectomy. Bilateral cochlear implants. Other: None. CT CERVICAL SPINE FINDINGS Alignment: Normal cervical lordosis. Skull base and vertebrae: No acute fracture. No primary bone lesion or focal pathologic process. Soft tissues and spinal canal: No prevertebral fluid or  swelling. No visible canal hematoma. Disc levels: Mild degenerative changes at C5-6. Spinal canal is patent. Upper chest: Visualized lung apices are clear. Other: None. IMPRESSION: Normal head CT. No evidence of traumatic injury to the cervical spine. Electronically Signed   By: Julian Hy M.D.   On: 08/02/2017 20:54   Dg Chest Port 1 View  Result Date: 08/02/2017 CLINICAL DATA:  Chest pain after trauma EXAM: PORTABLE CHEST 1 VIEW COMPARISON:  None. FINDINGS: Lungs are clear. Heart size and pulmonary vascularity are normal. No adenopathy. No pneumothorax. No bone lesions. IMPRESSION: No edema or consolidation.  No pneumothorax. Electronically Signed   By: Lowella Grip III M.D.   On: 08/02/2017 19:20    Procedures Procedures (including critical care time)  Medications Ordered in ED Medications  cephALEXin (KEFLEX) capsule 1,000 mg (has no administration in time range)  fluticasone (FLONASE) 50 MCG/ACT nasal spray 2 spray (has no administration in time range)  docusate sodium (COLACE) capsule 100 mg (has no administration in time range)  levothyroxine (SYNTHROID, LEVOTHROID) tablet 150 mcg (has no administration in time range)  pantoprazole (PROTONIX) EC tablet 40 mg (has no administration in time range)  pregabalin (LYRICA) capsule 50 mg (has no administration in time range)  topiramate (TOPAMAX) tablet 50 mg (has no administration in time range)  venlafaxine XR (EFFEXOR-XR) 24 hr capsule 150 mg (has no administration in time range)  QUEtiapine (SEROQUEL XR) 24 hr tablet 150 mg (has no administration in time range)  lurasidone (LATUDA) tablet 20 mg (has no administration in time range)  lamoTRIgine (LAMICTAL) tablet 200 mg (has no administration in time range)  lactated ringers bolus 1,000 mL (1,000 mLs Intravenous New Bag/Given 08/02/17 2037)     Initial Impression / Assessment and Plan / ED Course  I have reviewed the triage vital signs and the nursing notes.  Pertinent labs  & imaging results that were available during my care of the patient were reviewed by me and considered in my medical decision making (see chart for details).     Brandi Love is a 42 year old female with history of chronic kidney disease, depression, deafness, hypertension, high cholesterol, seizures, OCD who presents to the ED following suicide attempt by hanging.  Patient with normal vitals upon arrival.  Patient with a airway, breathing, circulation intact.  Patient is alert and awake and overall well-appearing.  She does complain of some neck pain but denies any numbness or tingling down her arms.  No pain elsewhere.  Patient states that she has been depressed and felt like the only thing she could do was to end her life.  She sent a photo to a friend and EMS and police were called to go out to her house.  Patient had tied a blanket around her neck and hung it from a door.  Patient states that she was in a seated position and closed the door with a blanket tied around her neck.  Patient with no obvious signs of external trauma on exam.  Has normal neurological exam.  Patient has normal strength in her upper extremities and lower extremities.  No difficulty breathing.  Bedside chest x-ray showed no signs of pneumonia, pneumothorax, pleural effusion.  Patient states that she thinks he lost consciousness.  However, she does remember struggling to breathe for a bit.  Patient has some neck pain on exam and will obtain a head CT, neck CT.  Patient is neurovascularly intact also on exam.  Will also get basic labs including Tylenol level, salicylate level, alcohol level, UDS.  Patient denies taking any medications or alcohol to harm her self as well.  Patient given normal saline bolus.  IVC paperwork filled out given potential need for outpatient psychiatric care.  Patient with creatinine of 1.5 but according to prior this is baseline.  Patient has a history of CKD.  Otherwise electrolytes within normal limits.   Lactic acid mildly elevated likely secondary to trauma.  Patient otherwise  no significant leukocytosis.  Patient with undetectable alcohol level, Tylenol level, salicylate level.  Patient had no acute findings on head or neck CT.  Patient is reevaluated for possible need for MRI of her cervical spine but she has no midline spinal tenderness.  Mostly paraspinal neck pain.  Patient has no numbness or tingling down her arms, normal strength in upper extremities and no concern for ligamentous injury or cord injury.  Patient with fairly low mechanism to cause his injuries as well. Unable to get CTA of neck due to allergy, however, patient asymptomatic and no concern for injury given physical as well. She is overall pain-free at this time and medically cleared.  Patient to be evaluated by TTS and home medications were ordered.  Patient placed in psych hold.  Final Clinical Impressions(s) / ED Diagnoses   Final diagnoses:  Suicidal behavior with attempted self-injury [facility])    ED Discharge Orders    None       Lennice Sites, DO 08/02/17 2257    Elnora Morrison, MD 08/03/17 [phone]

## 2017-08-02 NOTE — ED Notes (Signed)
Sitter at the bedside suicidal

## 2017-08-02 NOTE — Progress Notes (Signed)
Orthopedic Tech Progress Note Patient Details:  Jaquala Fuller Meas 1975-10-09 [id number] Level 2 trauma ortho visit. Patient ID: ALEXARAE OLIVA, female   DOB: 08-21-1975, 42 y.o.   MRN: [id number]   Braulio Bosch 08/02/2017, 7:23 PM

## 2017-08-02 NOTE — ED Notes (Signed)
The ed resident reports that the pt has been cleared  Medically

## 2017-08-02 NOTE — ED Notes (Signed)
Pt laughing and talking with her sitter

## 2017-08-02 NOTE — ED Notes (Signed)
The pt reports that she has anorexia and has been taking  meds to loose weight.  She had a large brown bm on arrival  No urine yet

## 2017-08-02 NOTE — ED Notes (Signed)
A [employer] officer has just arrived with ivc papers
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
## 2017-08-02 NOTE — Progress Notes (Signed)
   08/02/17 1900  Clinical Encounter Type  Visited With Patient  Visit Type Trauma  Referral From Nurse  Consult/Referral To Chaplain  Spiritual Encounters  Spiritual Needs Emotional  Stress Factors  Patient Stress Factors Health changes  Chaplain visited with PT upon arrival to offer care.  PT asked for Father to be called.  Chaplain spoke briefly with father of PT to inform that PT is in emergency room

## 2017-08-02 NOTE — ED Notes (Signed)
ivc papers  Presented to the pt by a Quarry manager

## 2017-08-02 NOTE — ED Notes (Signed)
Suicidal sitter at the bedside

## 2017-08-02 NOTE — ED Triage Notes (Signed)
The pt arrived by gems from home.  She had tied a sheet around her neck and thrown it over the door she took a picture of herself hanging and sent it to a friend and that friend called ems.  On arrival she had the sheet around her neck but was alert with no distress.  She reports that she was trying to hurt herself she has multiple cuts over body in different stages of healing.  She has no family here in town.  lmp none hysterectomy

## 2017-08-02 NOTE — ED Notes (Signed)
The pt retujrned from c-t with sitter at her bedside

## 2017-08-03 ENCOUNTER — Encounter (HOSPITAL_COMMUNITY): Payer: Self-pay

## 2017-08-03 ENCOUNTER — Other Ambulatory Visit: Payer: Self-pay

## 2017-08-03 DIAGNOSIS — T1491XA Suicide attempt, initial encounter: Secondary | ICD-10-CM | POA: Diagnosis not present

## 2017-08-03 LAB — RAPID URINE DRUG SCREEN, HOSP PERFORMED
Amphetamines: NOT DETECTED
Barbiturates: NOT DETECTED
Benzodiazepines: NOT DETECTED
COCAINE: NOT DETECTED
OPIATES: NOT DETECTED
Tetrahydrocannabinol: NOT DETECTED

## 2017-08-03 LAB — URINALYSIS, ROUTINE W REFLEX MICROSCOPIC
Bilirubin Urine: NEGATIVE
GLUCOSE, UA: NEGATIVE mg/dL
HGB URINE DIPSTICK: NEGATIVE
Ketones, ur: NEGATIVE mg/dL
Leukocytes, UA: NEGATIVE
Nitrite: NEGATIVE
Protein, ur: NEGATIVE mg/dL
SPECIFIC GRAVITY, URINE: 1.01 (ref 1.005–1.030)
pH: 5 (ref 5.0–8.0)

## 2017-08-03 LAB — PREGNANCY, URINE: Preg Test, Ur: NEGATIVE

## 2017-08-03 MED ORDER — ACETAMINOPHEN 325 MG PO TABS
650.0000 mg | ORAL_TABLET | ORAL | Status: DC | PRN
  Administered 2017-08-03 – 2017-08-05 (×3): 650 mg via ORAL
  Filled 2017-08-03 (×4): qty 2

## 2017-08-03 NOTE — ED Notes (Signed)
Per shift report, pt had advised she did not want to eat any food. Staff have been unable wake pt for her to eat or take meds. Respirations even, unlabored.

## 2017-08-03 NOTE — ED Notes (Signed)
Pt to restroom with sitter. States that she has loose BMs due to medication that she is taking. Ask pt what medication and she states "Ducolax". "I've been taking it for several weeks". When ask when she last ate a meal, pt replied "I don't know". States that she doesn't want anyone to bring a tray into her room in the morning. "I'm anorexic and I do not eat" Spoke with pt about the laxatives and not eating and pt states that she understands but will not change her mind. "I drink water and gatorade". Gave pt the menu to see if maybe there was something like fruit she would like, but pt refused to look at it.  "No, I don't want anything." Informed pt that this RN will be ordering a tray for her, and it will be at the nurses desk if she changes her mind. Sitter at bedside. Pt states that she did not urinate while in the restroom. Aware that we do need a urine sample at some point.

## 2017-08-03 NOTE — ED Notes (Signed)
Urine specimen cup placed in room d/t specimen needed. Sitter aware.

## 2017-08-03 NOTE — ED Notes (Signed)
Breakfast tray ordered 

## 2017-08-03 NOTE — ED Notes (Signed)
Attempted to wake pt - unable to do so. Respirations even, unlabored.

## 2017-08-03 NOTE — ED Notes (Signed)
Cochlear implants x 2 charging on charger in pt's room. Pt noted to be sleeping. Pt had asked Candy, RN, not to bring her meal tray into her room.

## 2017-08-03 NOTE — ED Provider Notes (Signed)
The patient is alert and cooperative today.  She is comfortable.  She denies neck pain or trouble breathing at this time.  She has been eating well.  She understands that she requires placement.  TTS plans on placing her in a psychiatric facility.   Daleen Bo, MD 08/03/17 [phone]

## 2017-08-03 NOTE — ED Notes (Signed)
Called [facility] and spoke with Jinny Sanders, which performed pts TTS. Informed her that pt admits to having anorexia and will not eat. Did not know if this would affect placement due to eating disorder.

## 2017-08-03 NOTE — ED Notes (Signed)
Patient denies pain and is resting comfortably.  

## 2017-08-03 NOTE — ED Notes (Signed)
States she does not want to eat breakfast or lunch tomorrow.

## 2017-08-03 NOTE — ED Notes (Signed)
Pt given Tylenol as requested for chest discomfort. States hurts worse when touches chest. No shortness of breath noted. States this is not new and "Tylenol always helps".

## 2017-08-03 NOTE — BH Assessment (Signed)
Pt reported she is hard of hearing but can read lips. Pt stated she slept very well but is not eating due to an eating disorder and states she had crackers last night. Pt denies SI but did report she tried to hang herself yesterday and could not explain how things were different from yesterday's attempts. Pt states she sometimes hear voices and heard them yesterday. Pt denies HI. Pt continues to meet inpatient criteria.

## 2017-08-03 NOTE — ED Notes (Addendum)
Patient denies pain and is resting comfortably. Pt is deaf but has bilat coachlear implants. Pt states that she can read lips also. Pt states that she needs her charger for her coachlear implants. Charger brought to pts bedside. Sitter to monitor. Told pt that it will be kept in her locker until she needs it again. Pt states understanding.

## 2017-08-03 NOTE — ED Notes (Signed)
Unable to wake pt so may administer meds. Will administer when pt awakens.

## 2017-08-03 NOTE — ED Notes (Addendum)
Pt awake now - urine specimen was obtained. Pt placed on her cochlear implants - bil. Charger at bedside. Re-TTS performed. Pt declining to eat lunch - she is eating crackers, graham crackers, and peanut butter that was given. Also drinkng 2 cups of water as requested. Pt noted to be calm, cooperative, and pleasant. Called pt's mother, Brandi Love, as requested by pt and asked her if she is able to go by pt's apartment and ensure door is locked d/t Emergency Management had to pry door open when brought her to ED and also bring her some belongings that she had requested - suitcase, cell phone, and cell phone charger. Pt aware her mother advised she is not able to do this until 48. Pt concerned about the possibility of people of being able to enter her apartment. Pt advised the name of her apartment building is [address] in [place]. RN spoke w/pt's emergency management person, Mitzi Hansen, who advised he repaired it for her yesterday. Pt voiced relief and appreciation. Pt showed RN open wounds to anterior bil thighs - states she had previously cut herself intentionally and she picks the scabs. No redness/swelling noted around sites.

## 2017-08-03 NOTE — ED Notes (Signed)
Attempted to draw beta HCG. Pt refuses at this time. Pt aware that we need a urine sample.

## 2017-08-04 DIAGNOSIS — T1491XA Suicide attempt, initial encounter: Secondary | ICD-10-CM | POA: Diagnosis not present

## 2017-08-04 NOTE — Progress Notes (Signed)
Pt verbally referred to [facility].  They have no beds but asked [place] to call tomorrow [phone]  Areatha Keas. Judi Cong, MSW, [place] Disposition Clinical Social Work [phone] (cell) [phone] (office)

## 2017-08-04 NOTE — ED Triage Notes (Signed)
Pt did not eat breakfast  Today. Pt would not open eyes  To interact  With staff. Pt  Rolled to Lt and Rt in bed . Pt did not have hearing in place.

## 2017-08-04 NOTE — ED Triage Notes (Signed)
TC from crisis center for up date on PT condition. Contact person Debbie . Call [phone]

## 2017-08-04 NOTE — ED Triage Notes (Signed)
Pt ate dinner tonight.

## 2017-08-04 NOTE — ED Notes (Signed)
Sitter accompanying pt to walk outside of room for a few minutes as pt is anxious and "needs to get out of this room"  Pt alert, calm, and cooperative.  Sitter walking holding pt's hand.

## 2017-08-04 NOTE — ED Notes (Addendum)
In to see pt to bring pt her seroquel, Pt awake laying in bed. While removing medication from packaging, pt looks at me and points to her neck. Pt has her pants tied around her neck. Used trauma shears to cut them from around her neck. Was able to get all my fingers between neck and pants without problem. Airway was not compromised. Paper scrubs knotted multiple times. Pt has slight redness around neck, good pulses bilat, pupils PERRLA, Pt is alert and orient x 4. Pt states that she wanted to speak with someone and no one would talk to her. This RN looked in on pt multiple times before incident and pt was either awake or sleeping. Pt stated "I pulled the blanket up so you wouldn't see my neck. Sitter was outside of door entire time also and saw that pt was either awake or "sleeping". Blankets removed from room. Pt had cochlear battery charger in room, that removed also and placed at the nurses station. Pt aware. Placed in a bag with her name on it. Informed when she needs her batteries charged, the implants with charge at the nurses station only. Lights on in room and sitter to bedside. Paper scrub bottoms cut into shorts, given to patient. Pt laughs and smiles. "thank you". Pt states "I've done this a bunch of times and been in the hospital a bunch of times for this" "I ate too much for dinner tonight and I need to purge. Pt states that she needed to use restroom. Aware that the door will be left open and sitter or RN will watch pt.

## 2017-08-04 NOTE — ED Notes (Signed)
B POD md, Dr. Stark Jock made aware of pt situation and will come see pt. All interventions currently in place, md made aware of. Santiago Glad, Agricultural consultant made aware of situation and is aware that md knows. Pt is alert and orient x 4 with no distress noted. Pt laughing and joking with sitter currently. Will continue to monitor. Lights will be left on in pts room. No blanket at this time.

## 2017-08-04 NOTE — BHH Counselor (Signed)
Patient report she's in the hospital due to trying to kill herself. TTS writer had to repeat questions several times for patient to understand. Patient report she tired to kill herself last night due to being frustrated that the nurse would not talk to her. Patient denies feeling SI, HI and AVH.   Patient present with a flat affect and talk slow. Patient had a different time Environmental health practitioner.   Disposition: Patient continue to meet inpatient criteria per Elmarie Shiley, NP.

## 2017-08-04 NOTE — ED Notes (Signed)
Dinner tray ordered at 15:27.

## 2017-08-04 NOTE — ED Triage Notes (Signed)
Pt is awake and asking for  Hearing aid charger . Pt wanted to take Charger to room. Pt instructed  She could not have any cords because last night she tied the pant legs around her neck.  Pt agreed to charge hearing aids

## 2017-08-04 NOTE — Progress Notes (Signed)
CSW left message with [facility] answering service regarding pt placement. Intake/Admissions staff at the facility will be notified and contact Disposition within the next 24 hours to discuss bed availability.   Audree Camel, LCSW, [place] Disposition [facility]/TTS [phone]

## 2017-08-04 NOTE — ED Triage Notes (Signed)
PT now has meds for 0800 and 1000. Pt continues to keep eyes closed. Sternal rub patient rolled in bed away from this writer. Pt does not open eyes and is deaf . Pt does not have hearing aides in place. Hearing aides on patient bed side table. Pt vitals R-20, P-95, O2% -97 on room air, BP 116/80. Pt resp unlabored ,skin warm and dry. Pt reads lips but does not open eyes to touch . Unable to give PT. AM meds at this time.

## 2017-08-04 NOTE — ED Triage Notes (Signed)
PT has eyes closed and PT is deaf.Pt does not have hearing aids in place. Attempted to wake PT for meds. , unable to communicate with patient  With speech because pt does not have hearing aids on. Attempted to wake patient by touch to arm ,Pt made sound and rolled away to opposite side of bed. Pt did not open eyes.  No acute distress noted. Skin warm and dry ,Resop even and unlabored.

## 2017-08-04 NOTE — ED Triage Notes (Signed)
PT did not eat meal  delivered for breakfast .

## 2017-08-04 NOTE — ED Notes (Signed)
Breakfast tray ordered 

## 2017-08-04 NOTE — ED Notes (Signed)
Pt talking with sitter. Laughing and joking, watching TV.

## 2017-08-04 NOTE — ED Triage Notes (Signed)
Per request of patient Brandi Love health care contacted to report Pt not at home for nurse visit today

## 2017-08-04 NOTE — ED Notes (Signed)
Pt woke up for tts, this nurse manually opened pt's eyes - handed pt her hearing aids, was able to complete TTS.

## 2017-08-04 NOTE — ED Triage Notes (Signed)
Pt did not eat lunch . Pt continued to keep eyes closed.

## 2017-08-05 DIAGNOSIS — T1491XA Suicide attempt, initial encounter: Secondary | ICD-10-CM | POA: Diagnosis not present

## 2017-08-05 NOTE — ED Notes (Signed)
Patient refused for her Vital Signs to be taken.

## 2017-08-05 NOTE — ED Notes (Signed)
Regular Diet was ordered for Lunch. 

## 2017-08-05 NOTE — ED Notes (Signed)
Pt started took her pants off and attempted to tie it around her neck.  Pants taken away from pt

## 2017-08-05 NOTE — ED Notes (Signed)
Snack and Drink given to patient.

## 2017-08-05 NOTE — ED Notes (Addendum)
Pt asking for 'shorts'. It is explained to pt by RN that she has tried to harm herself two times today using pants. She is not given pants at this time. Has underwear and several blankets already. Agrees to wait for 'shorts' until tomorrow after further observation.

## 2017-08-05 NOTE — BH Assessment (Signed)
[name] Assessment Progress Note  Pt reassessed today. She denies current SI and says when she tied her pants around her neck to day before yesterday, she just did it b/c she was "upset", but denies that she wanted to kill herself. Pt advised that, due to her gesture, IP treatment will still be sought and, hopefully, we'll hear something today from a hospital. Pt requested that she be referred to [facility] in [place], which she reports used to be [facility]. She was there "last year" and really liked them. She is not sure if they address eating disorders.   Kenna Gilbert. Lovena Le, [place], [place], [facility]CA Counselor

## 2017-08-05 NOTE — ED Notes (Signed)
Regular breakfast tray ordered.  

## 2017-08-05 NOTE — ED Notes (Signed)
Regular Diet has been ordered for [employer]

## 2017-08-05 NOTE — Progress Notes (Signed)
Patient reassessed today and requested that we follow up with Brandi Love (Previously [facility]) in [place].    Referral documentation faxed to [facility].  Areatha Keas. Judi Cong, MSW, [place] Disposition Clinical Social Work [phone] (cell) [phone] (office)

## 2017-08-05 NOTE — ED Notes (Addendum)
Pt care assumed, obtained verbal report.  Pt is calm and cooperative.  List of belongings faxed to her parents as requested.  Sitter at bedside

## 2017-08-05 NOTE — ED Notes (Signed)
Talking to mother on phone

## 2017-08-06 DIAGNOSIS — T1491XA Suicide attempt, initial encounter: Secondary | ICD-10-CM | POA: Diagnosis not present

## 2017-08-06 MED ORDER — LAMOTRIGINE 100 MG PO TABS
200.0000 mg | ORAL_TABLET | Freq: Every day | ORAL | Status: DC
  Administered 2017-08-06 – 2017-08-07 (×2): 200 mg via ORAL
  Filled 2017-08-06 (×2): qty 2

## 2017-08-06 MED ORDER — CEPHALEXIN 250 MG PO CAPS
1000.0000 mg | ORAL_CAPSULE | Freq: Two times a day (BID) | ORAL | Status: DC
  Administered 2017-08-06 – 2017-08-07 (×3): 1000 mg via ORAL
  Filled 2017-08-06 (×3): qty 4

## 2017-08-06 MED ORDER — DOCUSATE SODIUM 100 MG PO CAPS
100.0000 mg | ORAL_CAPSULE | Freq: Two times a day (BID) | ORAL | Status: DC
  Administered 2017-08-06 – 2017-08-07 (×3): 100 mg via ORAL
  Filled 2017-08-06 (×3): qty 1

## 2017-08-06 MED ORDER — TOPIRAMATE 25 MG PO TABS
50.0000 mg | ORAL_TABLET | Freq: Two times a day (BID) | ORAL | Status: DC
  Administered 2017-08-06 – 2017-08-07 (×3): 50 mg via ORAL
  Filled 2017-08-06 (×3): qty 2

## 2017-08-06 NOTE — ED Notes (Signed)
Pt. Given cell phone. Will collect phone at [place].

## 2017-08-06 NOTE — ED Notes (Signed)
Pt. Given pants. Thoroughly explained to the pt. That her pants are a privledge due to her recent attempts of self harm. Explained to pt. That any actions of self harm would not be tolerated and pants would be taken away. Pt. Agreed. Spoke with sitter and explained the importance of watching pt. To make sure an event does not happen. Will continue to monitor.

## 2017-08-06 NOTE — ED Notes (Signed)
[facility] notified that pt is more alert now and if available a TTS could be completed

## 2017-08-06 NOTE — ED Notes (Signed)
Pt requesting to use her cell phone d/t not being able to hear on the desk phone d/t hearing impairment, pt aware that she cannot have her phone, this RN agrees to allow the pt to have her phone supervised for 15 mins in the morning and 15 mins in the afternoon so that she can text in place of her phone call, the time extension has been implemented d/t the challenges of communication for this pt, the Pt was informed that another Rn will base this decision on how the pt is reacting to treatment and her needs, pt advised that d/t placing pants around her neck at this time she will continue to have only the scrub top and mesh panties d/t safety risks, pt verbalizes understanding of plan of care, pt calm and cooperative at this time

## 2017-08-06 NOTE — Progress Notes (Signed)
Referral information was faxed to the following hospitals: [facility]  [facility]  [facility]  [facility]  [facility]  Disposition will continue to assist with placement needs.   Audree Camel, LCSW, [place] Disposition [place] [facility]/TTS [phone]
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
## 2017-08-06 NOTE — ED Notes (Signed)
Pts father given belongings that he brought in based on pts request d/t amount of items and guidelines of what pt can have for safety while here, pt verbalized understanding, this RN completed additional belongings inventory, pt has x 1 blanket, x1 cell phone and x1 charger, pt has x 1 coloring book placed with belongings in [place] [address]

## 2017-08-06 NOTE — ED Notes (Signed)
Declined shower. 

## 2017-08-06 NOTE — ED Notes (Signed)
Pt Act Team member Jackelyn Poling called and updated on plan of care, Debbie requesting to be updated on placement, contact phone number [phone]

## 2017-08-06 NOTE — ED Notes (Signed)
Pts father called reporting the list faxed yesterday of pts request for belongings to be brought in was not legible, this RN rewrote the list and faxed to [phone] per father's request

## 2017-08-06 NOTE — ED Notes (Signed)
Pts father at bedside visiting the pt, pts father brought in 6 bags of belongings for pt, this RN encouraged the pts father to only leave belongings that are necessary for her care d/t liability and POD F guidelines re: having access to pt belongings d/t safety concerns

## 2017-08-06 NOTE — BHH Counselor (Signed)
Pt admits to recent SI attempt. Pt denies HI and AVH.   TTS will continue to seek placement.   Lorenza Cambridge, [facility] Triage Specialist

## 2017-08-06 NOTE — ED Notes (Signed)
Regular Diet was ordered for Dinner. 

## 2017-08-07 DIAGNOSIS — T1491XA Suicide attempt, initial encounter: Secondary | ICD-10-CM | POA: Diagnosis not present

## 2017-08-07 LAB — I-STAT CHEM 8, ED
BUN: 35 mg/dL — AB (ref 6–20)
CHLORIDE: 106 mmol/L (ref 101–111)
Calcium, Ion: 1.32 mmol/L (ref 1.15–1.40)
Creatinine, Ser: 1.9 mg/dL — ABNORMAL HIGH (ref 0.44–1.00)
Glucose, Bld: 109 mg/dL — ABNORMAL HIGH (ref 65–99)
HEMATOCRIT: 36 % (ref 36.0–46.0)
Hemoglobin: 12.2 g/dL (ref 12.0–15.0)
Potassium: 4.4 mmol/L (ref 3.5–5.1)
SODIUM: 137 mmol/L (ref 135–145)
TCO2: 22 mmol/L (ref 22–32)

## 2017-08-07 NOTE — ED Notes (Signed)
Cell phone obtained and placed in drawer beside nurse computer.

## 2017-08-07 NOTE — Progress Notes (Signed)
Pt accepted to [facility], [place] [address] Dr. Drusilla Kanner is the attending provider.  Call report to [phone]  Pt is IVC PLEASE FAX IVC TO [phone] Pt may be transported by Law Enforcement Pt scheduled to arrive at [facility] as soon as transport can be arranged. [email or web address] notified.  Areatha Keas. Judi Cong, MSW, [place] Disposition Clinical Social Work [phone] (cell) [phone] (office)

## 2017-08-07 NOTE — ED Notes (Addendum)
Placed hearing aid batteries on charger. Also placed phone on charge. Phone and batteries placed in drawer beside computer this nurse is at.

## 2017-08-07 NOTE — ED Notes (Signed)
Pt. Reports itching. Pt. States she has itching when her kidney levels are up due to her eating disorder. Palumbo, MD made aware. Labs ordered and collected. Will continue to monitor.

## 2017-08-07 NOTE — ED Notes (Signed)
[facility] [employer] called for transport to [facility] in [place]. Sheriff states it will be a little bit, but will get the pt sometime this afternoon and that they will call while en route.

## 2017-08-07 NOTE — Discharge Planning (Signed)
Discussed at [place] of Stay Meetings, dates discussed:   4/18: Dr A questions [facility] placement.

## 2017-08-07 NOTE — ED Notes (Signed)
Pt sleeping- this nurse had to pull eyelids open, pt woke up, smiling, requesting to do TTS later this afternoon.

## 2017-08-07 NOTE — ED Notes (Signed)
Pt. States she does not want meal tray ordered.

## 2017-08-07 NOTE — ED Notes (Signed)
Pt. Asked for wound dressings to be changed on legs dt pt. Picking at scabs. Wounds cleaned, bacitracin applied and gauze replaced.

## 2017-08-08 DIAGNOSIS — Z7289 Other problems related to lifestyle: Secondary | ICD-10-CM | POA: Insufficient documentation

## 2017-08-11 ENCOUNTER — Encounter (HOSPITAL_COMMUNITY): Payer: Self-pay | Admitting: Emergency Medicine

## 2017-08-25 ENCOUNTER — Encounter (HOSPITAL_COMMUNITY): Payer: Self-pay | Admitting: *Deleted

## 2017-08-25 DIAGNOSIS — H9190 Unspecified hearing loss, unspecified ear: Secondary | ICD-10-CM | POA: Diagnosis present

## 2017-08-25 DIAGNOSIS — Z7951 Long term (current) use of inhaled steroids: Secondary | ICD-10-CM

## 2017-08-25 DIAGNOSIS — F603 Borderline personality disorder: Secondary | ICD-10-CM | POA: Diagnosis present

## 2017-08-25 DIAGNOSIS — R55 Syncope and collapse: Secondary | ICD-10-CM | POA: Diagnosis not present

## 2017-08-25 DIAGNOSIS — Z79890 Hormone replacement therapy (postmenopausal): Secondary | ICD-10-CM

## 2017-08-25 DIAGNOSIS — E039 Hypothyroidism, unspecified: Secondary | ICD-10-CM | POA: Diagnosis present

## 2017-08-25 DIAGNOSIS — Z79899 Other long term (current) drug therapy: Secondary | ICD-10-CM

## 2017-08-25 DIAGNOSIS — Z90710 Acquired absence of both cervix and uterus: Secondary | ICD-10-CM

## 2017-08-25 DIAGNOSIS — E860 Dehydration: Secondary | ICD-10-CM | POA: Diagnosis present

## 2017-08-25 DIAGNOSIS — Z23 Encounter for immunization: Secondary | ICD-10-CM

## 2017-08-25 DIAGNOSIS — Z9621 Cochlear implant status: Secondary | ICD-10-CM | POA: Diagnosis present

## 2017-08-25 DIAGNOSIS — N170 Acute kidney failure with tubular necrosis: Principal | ICD-10-CM | POA: Diagnosis present

## 2017-08-25 DIAGNOSIS — N179 Acute kidney failure, unspecified: Secondary | ICD-10-CM | POA: Diagnosis present

## 2017-08-25 DIAGNOSIS — F333 Major depressive disorder, recurrent, severe with psychotic symptoms: Secondary | ICD-10-CM | POA: Diagnosis present

## 2017-08-25 DIAGNOSIS — F5000 Anorexia nervosa, unspecified: Secondary | ICD-10-CM | POA: Diagnosis present

## 2017-08-25 DIAGNOSIS — F552 Abuse of laxatives: Secondary | ICD-10-CM | POA: Diagnosis present

## 2017-08-25 DIAGNOSIS — Z7982 Long term (current) use of aspirin: Secondary | ICD-10-CM

## 2017-08-25 DIAGNOSIS — N183 Chronic kidney disease, stage 3 (moderate): Secondary | ICD-10-CM | POA: Diagnosis present

## 2017-08-25 DIAGNOSIS — F502 Bulimia nervosa: Secondary | ICD-10-CM | POA: Diagnosis present

## 2017-08-25 DIAGNOSIS — F429 Obsessive-compulsive disorder, unspecified: Secondary | ICD-10-CM | POA: Diagnosis present

## 2017-08-25 LAB — CBC
HEMATOCRIT: 41.7 % (ref 36.0–46.0)
HEMOGLOBIN: 14.3 g/dL (ref 12.0–15.0)
MCH: 31.6 pg (ref 26.0–34.0)
MCHC: 34.3 g/dL (ref 30.0–36.0)
MCV: 92.3 fL (ref 78.0–100.0)
Platelets: 317 10*3/uL (ref 150–400)
RBC: 4.52 MIL/uL (ref 3.87–5.11)
RDW: 12.9 % (ref 11.5–15.5)
WBC: 10.1 10*3/uL (ref 4.0–10.5)

## 2017-08-25 LAB — LIPASE, BLOOD: LIPASE: 47 U/L (ref 11–51)

## 2017-08-25 LAB — COMPREHENSIVE METABOLIC PANEL
ALT: 33 U/L (ref 14–54)
ANION GAP: 14 (ref 5–15)
AST: 27 U/L (ref 15–41)
Albumin: 4.1 g/dL (ref 3.5–5.0)
Alkaline Phosphatase: 78 U/L (ref 38–126)
BUN: 30 mg/dL — AB (ref 6–20)
CO2: 18 mmol/L — ABNORMAL LOW (ref 22–32)
Calcium: 10.3 mg/dL (ref 8.9–10.3)
Chloride: 105 mmol/L (ref 101–111)
Creatinine, Ser: 2.22 mg/dL — ABNORMAL HIGH (ref 0.44–1.00)
GFR calc Af Amer: 31 mL/min — ABNORMAL LOW (ref >60)
GFR, EST NON AFRICAN AMERICAN: 27 mL/min — AB (ref >60)
Glucose, Bld: 96 mg/dL (ref 65–99)
POTASSIUM: 4.2 mmol/L (ref 3.5–5.1)
Sodium: 137 mmol/L (ref 135–145)
TOTAL PROTEIN: 7.4 g/dL (ref 6.5–8.1)
Total Bilirubin: 0.6 mg/dL (ref 0.3–1.2)

## 2017-08-25 LAB — ACETAMINOPHEN LEVEL

## 2017-08-25 LAB — I-STAT BETA HCG BLOOD, ED (MC, WL, AP ONLY): I-stat hCG, quantitative: <5 m[IU]/mL (ref <5)

## 2017-08-25 LAB — SALICYLATE LEVEL

## 2017-08-25 NOTE — ED Triage Notes (Signed)
Pt in via [facility], per report pt took Melburn Popper to Dr. Johny Shears office, pt reports having syncopal episode last night, per report pt has been taking laxatives daily more than recommended, pt told EMS she has been [place] extra strength Tylenol daily for since last November, pt has pain to L lower leg, pt reports diarrhea, pt works with ACT team for care plan re: eating disorder

## 2017-08-26 ENCOUNTER — Encounter (HOSPITAL_COMMUNITY): Payer: Self-pay | Admitting: Family Medicine

## 2017-08-26 ENCOUNTER — Emergency Department (HOSPITAL_COMMUNITY): Payer: Medicare Other

## 2017-08-26 ENCOUNTER — Inpatient Hospital Stay (HOSPITAL_COMMUNITY)
Admission: EM | Admit: 2017-08-26 | Discharge: 2017-08-29 | DRG: 683 | Disposition: A | Discharge status: Home Health | Payer: Medicare Other | Attending: Internal Medicine | Admitting: Internal Medicine

## 2017-08-26 DIAGNOSIS — F333 Major depressive disorder, recurrent, severe with psychotic symptoms: Secondary | ICD-10-CM | POA: Diagnosis present

## 2017-08-26 DIAGNOSIS — N170 Acute kidney failure with tubular necrosis: Secondary | ICD-10-CM

## 2017-08-26 DIAGNOSIS — N189 Chronic kidney disease, unspecified: Secondary | ICD-10-CM | POA: Diagnosis present

## 2017-08-26 DIAGNOSIS — Z23 Encounter for immunization: Secondary | ICD-10-CM | POA: Diagnosis not present

## 2017-08-26 DIAGNOSIS — N179 Acute kidney failure, unspecified: Secondary | ICD-10-CM

## 2017-08-26 DIAGNOSIS — F502 Bulimia nervosa: Secondary | ICD-10-CM | POA: Diagnosis not present

## 2017-08-26 DIAGNOSIS — Z9621 Cochlear implant status: Secondary | ICD-10-CM | POA: Diagnosis not present

## 2017-08-26 DIAGNOSIS — F429 Obsessive-compulsive disorder, unspecified: Secondary | ICD-10-CM | POA: Diagnosis not present

## 2017-08-26 DIAGNOSIS — E038 Other specified hypothyroidism: Secondary | ICD-10-CM | POA: Diagnosis not present

## 2017-08-26 DIAGNOSIS — Z7951 Long term (current) use of inhaled steroids: Secondary | ICD-10-CM | POA: Diagnosis not present

## 2017-08-26 DIAGNOSIS — F5002 Anorexia nervosa, binge eating/purging type: Secondary | ICD-10-CM | POA: Diagnosis not present

## 2017-08-26 DIAGNOSIS — F552 Abuse of laxatives: Secondary | ICD-10-CM | POA: Diagnosis not present

## 2017-08-26 DIAGNOSIS — R55 Syncope and collapse: Secondary | ICD-10-CM | POA: Diagnosis present

## 2017-08-26 DIAGNOSIS — N183 Chronic kidney disease, stage 3 unspecified: Secondary | ICD-10-CM | POA: Diagnosis present

## 2017-08-26 DIAGNOSIS — E039 Hypothyroidism, unspecified: Secondary | ICD-10-CM | POA: Diagnosis not present

## 2017-08-26 DIAGNOSIS — Z79890 Hormone replacement therapy (postmenopausal): Secondary | ICD-10-CM | POA: Diagnosis not present

## 2017-08-26 DIAGNOSIS — F603 Borderline personality disorder: Secondary | ICD-10-CM | POA: Diagnosis not present

## 2017-08-26 DIAGNOSIS — Z7982 Long term (current) use of aspirin: Secondary | ICD-10-CM | POA: Diagnosis not present

## 2017-08-26 DIAGNOSIS — Z90710 Acquired absence of both cervix and uterus: Secondary | ICD-10-CM | POA: Diagnosis not present

## 2017-08-26 DIAGNOSIS — Z79899 Other long term (current) drug therapy: Secondary | ICD-10-CM | POA: Diagnosis not present

## 2017-08-26 DIAGNOSIS — I951 Orthostatic hypotension: Secondary | ICD-10-CM

## 2017-08-26 DIAGNOSIS — E860 Dehydration: Secondary | ICD-10-CM | POA: Diagnosis not present

## 2017-08-26 DIAGNOSIS — F50029 Anorexia nervosa, binge eating/purging type, unspecified: Secondary | ICD-10-CM

## 2017-08-26 DIAGNOSIS — F5000 Anorexia nervosa, unspecified: Secondary | ICD-10-CM | POA: Diagnosis not present

## 2017-08-26 DIAGNOSIS — H9190 Unspecified hearing loss, unspecified ear: Secondary | ICD-10-CM | POA: Diagnosis not present

## 2017-08-26 LAB — URINALYSIS, ROUTINE W REFLEX MICROSCOPIC
Bilirubin Urine: NEGATIVE
Glucose, UA: NEGATIVE mg/dL
HGB URINE DIPSTICK: NEGATIVE
Ketones, ur: NEGATIVE mg/dL
NITRITE: NEGATIVE
Protein, ur: NEGATIVE mg/dL
SPECIFIC GRAVITY, URINE: 1.009 (ref 1.005–1.030)
pH: 5 (ref 5.0–8.0)

## 2017-08-26 LAB — CREATININE, URINE, RANDOM: CREATININE, URINE: 78.29 mg/dL

## 2017-08-26 LAB — MAGNESIUM: MAGNESIUM: 2.3 mg/dL (ref 1.7–2.4)

## 2017-08-26 LAB — BASIC METABOLIC PANEL
Anion gap: 13 (ref 5–15)
BUN: 32 mg/dL — ABNORMAL HIGH (ref 6–20)
CALCIUM: 9.8 mg/dL (ref 8.9–10.3)
CO2: 15 mmol/L — ABNORMAL LOW (ref 22–32)
Chloride: 110 mmol/L (ref 101–111)
Creatinine, Ser: 2.03 mg/dL — ABNORMAL HIGH (ref 0.44–1.00)
GFR calc Af Amer: 34 mL/min — ABNORMAL LOW (ref >60)
GFR, EST NON AFRICAN AMERICAN: 30 mL/min — AB (ref >60)
GLUCOSE: 91 mg/dL (ref 65–99)
Potassium: 3.8 mmol/L (ref 3.5–5.1)
Sodium: 138 mmol/L (ref 135–145)

## 2017-08-26 LAB — CBG MONITORING, ED: Glucose-Capillary: 87 mg/dL (ref 65–99)

## 2017-08-26 LAB — SODIUM, URINE, RANDOM: Sodium, Ur: <10 mmol/L

## 2017-08-26 LAB — MRSA PCR SCREENING: MRSA by PCR: POSITIVE — AB

## 2017-08-26 LAB — PHOSPHORUS: Phosphorus: 5.4 mg/dL — ABNORMAL HIGH (ref 2.5–4.6)

## 2017-08-26 MED ORDER — QUETIAPINE FUMARATE 50 MG PO TABS
100.0000 mg | ORAL_TABLET | Freq: Every day | ORAL | Status: DC
  Administered 2017-08-26 – 2017-08-28 (×3): 100 mg via ORAL
  Filled 2017-08-26 (×3): qty 2

## 2017-08-26 MED ORDER — VENLAFAXINE HCL ER 75 MG PO CP24
225.0000 mg | ORAL_CAPSULE | Freq: Every day | ORAL | Status: DC
  Administered 2017-08-26 – 2017-08-29 (×4): 225 mg via ORAL
  Filled 2017-08-26 (×4): qty 1

## 2017-08-26 MED ORDER — PREGABALIN 50 MG PO CAPS
50.0000 mg | ORAL_CAPSULE | Freq: Three times a day (TID) | ORAL | Status: DC
  Administered 2017-08-26 – 2017-08-29 (×10): 50 mg via ORAL
  Filled 2017-08-26 (×5): qty 1
  Filled 2017-08-26: qty 2
  Filled 2017-08-26 (×4): qty 1

## 2017-08-26 MED ORDER — LURASIDONE HCL 40 MG PO TABS
80.0000 mg | ORAL_TABLET | Freq: Every day | ORAL | Status: DC
  Administered 2017-08-26 – 2017-08-29 (×4): 80 mg via ORAL
  Filled 2017-08-26 (×4): qty 2

## 2017-08-26 MED ORDER — LAMOTRIGINE 100 MG PO TABS
200.0000 mg | ORAL_TABLET | Freq: Every day | ORAL | Status: DC
  Administered 2017-08-26 – 2017-08-29 (×4): 200 mg via ORAL
  Filled 2017-08-26 (×4): qty 2

## 2017-08-26 MED ORDER — QUETIAPINE FUMARATE 25 MG PO TABS
25.0000 mg | ORAL_TABLET | Freq: Every day | ORAL | Status: DC
  Administered 2017-08-26 – 2017-08-28 (×3): 25 mg via ORAL
  Filled 2017-08-26 (×3): qty 1

## 2017-08-26 MED ORDER — LEVOTHYROXINE SODIUM 75 MCG PO TABS
150.0000 ug | ORAL_TABLET | Freq: Every day | ORAL | Status: DC
  Administered 2017-08-26 – 2017-08-29 (×4): 150 ug via ORAL
  Filled 2017-08-26: qty 1
  Filled 2017-08-26 (×3): qty 2

## 2017-08-26 MED ORDER — MUPIROCIN 2 % EX OINT
1.0000 "application " | TOPICAL_OINTMENT | Freq: Two times a day (BID) | CUTANEOUS | Status: DC
  Administered 2017-08-26 – 2017-08-29 (×5): 1 via NASAL
  Filled 2017-08-26: qty 22

## 2017-08-26 MED ORDER — MELATONIN 3 MG PO TABS
6.0000 mg | ORAL_TABLET | Freq: Every day | ORAL | Status: DC
  Administered 2017-08-26 – 2017-08-28 (×3): 6 mg via ORAL
  Filled 2017-08-26 (×3): qty 2

## 2017-08-26 MED ORDER — HYDROXYZINE HCL 25 MG PO TABS
50.0000 mg | ORAL_TABLET | Freq: Three times a day (TID) | ORAL | Status: DC | PRN
  Administered 2017-08-28: 50 mg via ORAL
  Filled 2017-08-26: qty 2

## 2017-08-26 MED ORDER — SODIUM CHLORIDE 0.9 % IV SOLN
INTRAVENOUS | Status: AC
  Administered 2017-08-26: 14:00:00 via INTRAVENOUS

## 2017-08-26 MED ORDER — OXYCODONE HCL 5 MG PO TABS
5.0000 mg | ORAL_TABLET | Freq: Four times a day (QID) | ORAL | Status: DC | PRN
Start: 2017-08-26 — End: 2017-08-29
  Administered 2017-08-26 – 2017-08-29 (×5): 5 mg via ORAL
  Filled 2017-08-26 (×5): qty 1

## 2017-08-26 MED ORDER — ASPIRIN EC 81 MG PO TBEC
81.0000 mg | DELAYED_RELEASE_TABLET | Freq: Every day | ORAL | Status: DC
  Administered 2017-08-26 – 2017-08-29 (×4): 81 mg via ORAL
  Filled 2017-08-26 (×4): qty 1

## 2017-08-26 MED ORDER — METOPROLOL SUCCINATE ER 50 MG PO TB24
50.0000 mg | ORAL_TABLET | Freq: Every day | ORAL | Status: DC
  Administered 2017-08-26 – 2017-08-29 (×4): 50 mg via ORAL
  Filled 2017-08-26 (×5): qty 1

## 2017-08-26 MED ORDER — TETANUS-DIPHTH-ACELL PERTUSSIS 5-2.5-18.5 LF-MCG/0.5 IM SUSP
0.5000 mL | Freq: Once | INTRAMUSCULAR | Status: AC
  Administered 2017-08-26: 0.5 mL via INTRAMUSCULAR
  Filled 2017-08-26: qty 0.5

## 2017-08-26 MED ORDER — SODIUM CHLORIDE 0.9 % IV SOLN
INTRAVENOUS | Status: DC
  Administered 2017-08-26: 02:00:00 via INTRAVENOUS

## 2017-08-26 MED ORDER — ACETAMINOPHEN 650 MG RE SUPP: 650.0000 mg | Freq: Four times a day (QID) | RECTAL | Status: DC | PRN

## 2017-08-26 MED ORDER — TOPIRAMATE 25 MG PO TABS
75.0000 mg | ORAL_TABLET | Freq: Two times a day (BID) | ORAL | Status: DC
  Administered 2017-08-26 – 2017-08-29 (×7): 75 mg via ORAL
  Filled 2017-08-26 (×7): qty 3

## 2017-08-26 MED ORDER — OMEGA-3-ACID ETHYL ESTERS 1 G PO CAPS
2.0000 g | ORAL_CAPSULE | Freq: Two times a day (BID) | ORAL | Status: DC
  Administered 2017-08-26: 2 g via ORAL
  Filled 2017-08-26 (×3): qty 2

## 2017-08-26 MED ORDER — ONDANSETRON HCL 4 MG/2ML IJ SOLN: 4.0000 mg | Freq: Four times a day (QID) | INTRAMUSCULAR | Status: DC | PRN

## 2017-08-26 MED ORDER — ONDANSETRON HCL 4 MG PO TABS: 4.0000 mg | ORAL_TABLET | Freq: Four times a day (QID) | ORAL | Status: DC | PRN

## 2017-08-26 MED ORDER — ACETAMINOPHEN 325 MG PO TABS
650.0000 mg | ORAL_TABLET | Freq: Four times a day (QID) | ORAL | Status: DC | PRN
  Administered 2017-08-26 – 2017-08-28 (×5): 650 mg via ORAL
  Filled 2017-08-26 (×5): qty 2

## 2017-08-26 MED ORDER — HEPARIN SODIUM (PORCINE) 5000 UNIT/ML IJ SOLN
5000.0000 [IU] | Freq: Three times a day (TID) | INTRAMUSCULAR | Status: DC
  Administered 2017-08-26 – 2017-08-29 (×7): 5000 [IU] via SUBCUTANEOUS
  Filled 2017-08-26 (×3): qty 1

## 2017-08-26 MED ORDER — CHLORHEXIDINE GLUCONATE CLOTH 2 % EX PADS
6.0000 | MEDICATED_PAD | Freq: Every day | CUTANEOUS | Status: DC
  Administered 2017-08-27 – 2017-08-29 (×3): 6 via TOPICAL

## 2017-08-26 MED ORDER — ADULT MULTIVITAMIN W/MINERALS CH
1.0000 | ORAL_TABLET | Freq: Every day | ORAL | Status: DC
  Administered 2017-08-26 – 2017-08-29 (×4): 1 via ORAL
  Filled 2017-08-26 (×4): qty 1

## 2017-08-26 MED ORDER — TRAZODONE HCL 50 MG PO TABS
50.0000 mg | ORAL_TABLET | Freq: Every day | ORAL | Status: DC
  Administered 2017-08-26 – 2017-08-28 (×3): 50 mg via ORAL
  Filled 2017-08-26 (×3): qty 1

## 2017-08-26 MED ORDER — SODIUM CHLORIDE 0.9 % IV SOLN
INTRAVENOUS | Status: DC
  Administered 2017-08-26: 10:00:00 via INTRAVENOUS

## 2017-08-26 MED ORDER — LAMOTRIGINE 200 MG PO TABS
200.0000 mg | ORAL_TABLET | Freq: Every day | ORAL | Status: DC
  Filled 2017-08-26: qty 1

## 2017-08-26 NOTE — ED Notes (Signed)
Patient is aware urine is needed and unable to provide a sample at this time.

## 2017-08-26 NOTE — ED Notes (Signed)
Checked patient cbg it was 34 notified RN Sarah of blood sugar patient is resting with call bell in reach

## 2017-08-26 NOTE — Progress Notes (Signed)
Patient admitted after midnight, please see H&P.  Known laxative abuser.  Now with AKI on CKD.  IVF and recheck orthostatics in the AM  [facility] DO

## 2017-08-26 NOTE — ED Notes (Signed)
Pt got up to BR with 2 stand by assist and walker. Able to use BR and collect urine sample in hat, measuring 900 cc. Also had diarrhea while on toilet.

## 2017-08-26 NOTE — H&P (Signed)
History and Physical    Brandi Love KXF:[id number] DOB: 1976/04/22 DOA: 08/26/2017  PCP: Vickii Penna, MD   Patient coming from: Home  Chief Complaint: Syncope on 08/24/17  HPI: Brandi Love is a 42 y.o. female with medical history significant for depression with psychosis, borderline personality disorder, OCD, hypothyroidism, deafness with cochlear implants, and chronic kidney disease stage III, now presenting to the emergency department after syncopal episode on 08/24/2017.  Patient reports that she has not been eating or drinking except for sips of water with laxatives.  She reports taking excessive laxatives over the past several days and acknowledges a history of eating disorder and laxative abuse.  She did not have any chest pain or palpitations prior to the syncopal episode.  She reports hitting her head but not losing consciousness and has had a mild headache and knee pain.  Denies suicidal or homicidal ideation, and denies active hallucinations.  ED Course: Upon arrival to the ED, patient is found to be afebrile, saturating well on room air, tachycardic to 120, and with stable blood pressure.  Chemistry panel is notable for a creatinine of 2.22, up from an apparent baseline of 1.50.  CBC is unremarkable.  CT head is negative for acute intracranial abnormality and radiographs of the knee are negative for acute fracture or dislocation.  Patient was started on IV fluid hydration in the ED with resolution of her tachycardia.  She will be admitted for ongoing evaluation and management.  Review of Systems:  All other systems reviewed and apart from HPI, are negative.  Past Medical History:  Diagnosis Date  . Anemia   . Anorexia   . Anxiety   . Arthritis    "hands" (09/18/2016)  . Chronic kidney disease   . Chronic lower back pain   . Chronic renal insufficiency   . Deaf   . Deafness   . Depression   . Diabetes ([place])    "borderline"  . Eating disorder   . Endometriosis   . GERD  (gastroesophageal reflux disease)   . History of kidney stones   . HTN (hypertension)   . Hyperlipidemia   . Hypertension   . 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 Migraine    "frequency varies" (09/18/2016)  . OCD (obsessive compulsive disorder)   . Pneumonia 2011  . PONV (postoperative nausea and vomiting)   . Pulmonary embolism ([place]) 10/2001  . Seizures ([place]) 09/2001 X 1   "day after my hysterectomy"  . Seizures ([place])   . Tachycardia     Past Surgical History:  Procedure Laterality Date  . ABDOMINAL HYSTERECTOMY  09/2001   'except for right ovary'  . BREAST LUMPECTOMY Left 2000   benign  . BREAST LUMPECTOMY Right 2002   benign  . COCHLEAR IMPLANT Bilateral 2004-2010   "right-left"  . COLONOSCOPY  06/2015    Dr Rolm Bookbinder at [place] for rectal bleeding: internal hemorrhoids, small anal fissure.    . ESOPHAGOGASTRODUODENOSCOPY N/A 10/07/2016   Procedure: ESOPHAGOGASTRODUODENOSCOPY (EGD);  Surgeon: Ladene Artist, MD;  Location: [facility] ENDOSCOPY;  Service: Endoscopy;  Laterality: N/A;  . EYE MUSCLE SURGERY Left 1980   "lazy eye"  . EYE MUSCLE SURGERY Right 1990   "lazy eye"  . LAPAROSCOPIC ABDOMINAL EXPLORATION  2001 and 2002   for endometriosis     reports that she has never smoked. She has never used smokeless tobacco. She reports that she does not drink alcohol or use drugs.  Allergies  Allergen Reactions  . Dhea [Nutritional Supplements] Nausea And Vomiting  . Entex Lq [Phenylephrine-Guaifenesin] Shortness Of Breath and Swelling    Throat swelling  . Indomethacin Nausea And Vomiting  . Iodinated Diagnostic Agents Anaphylaxis    'cant breathe'  . Melatonin Itching  . Phenylephrine-Guaifenesin Shortness Of Breath and Swelling    Throat swelling  . Propoxyphene Anaphylaxis  . Risperidone And Related Other (See Comments)    Out of body experience  . Shellfish Allergy Other (See Comments)    unspecified  . Depakote [Divalproex Sodium] Nausea  And Vomiting and Other (See Comments)    Weight gain  . Abilify [Aripiprazole] Nausea And Vomiting  . Dihydroergotamine Nausea And Vomiting  . Divalproex Sodium Nausea And Vomiting and Other (See Comments)    Weight gain  . Doxycycline Nausea And Vomiting  . Erythromycin Nausea And Vomiting  . Indomethacin Nausea And Vomiting  . Lactose Intolerance (Gi) Nausea And Vomiting  . Melatonin Itching  . Nsaids Other (See Comments)    Unable to take due to renal insufficiency  . Omega 3 [Dha-Epa-Vitamin E] Itching and Swelling  . Prasterone (Dhea) [Dhea] Nausea And Vomiting  . Risperidone And Related Other (See Comments)    Out of body feeling  . Shellfish Allergy Itching and Swelling    Eyes swell  . Statins Other (See Comments)    Cannot tolerate  . Tape Other (See Comments)    Redness/ please use paper tape  . Abilify [Aripiprazole] Nausea And Vomiting  . Dihydroergotamine Nausea And Vomiting  . Doxycycline Nausea And Vomiting  . Erythromycin Nausea And Vomiting  . Lactose Intolerance (Gi) Nausea And Vomiting  . Nsaids Other (See Comments)    Unable to ttake due to renal insufficency  . Tape Other (See Comments)    Redness, can use paper tape    Family History  Problem Relation Age of Onset  . Cancer Father   . Hyperlipidemia Father   . Osteoarthritis Mother   . Hyperlipidemia Mother      Prior to Admission medications   Medication Sig Start Date End Date Taking? Authorizing Provider  acetaminophen (TYLENOL) 500 MG tablet Take 1,000-3,000 mg by mouth See admin instructions. Take 2 tablets in the morning, at lunch and take 6 tablets at bedtime   Yes [provider]  aspirin EC 81 MG tablet Take 81 mg by mouth daily.   Yes [provider]  fenofibrate (TRICOR) 48 MG tablet Take 48 mg by mouth at bedtime.   Yes [provider]  fluticasone (FLONASE) 50 MCG/ACT nasal spray Place 2 sprays into both nostrils daily.  05/03/17  Yes [provider]   gemfibrozil (LOPID) 600 MG tablet Take 300-600 mg by mouth See admin instructions. Taking one tablet (600mg ) in the AM and 1/2 Tab (300mg ) in the evening. 07/17/17  Yes [provider]  hydrOXYzine (ATARAX/VISTARIL) 50 MG tablet Take 50 mg by mouth 3 (three) times daily as needed for itching.    Yes [provider]  lamoTRIgine (LAMICTAL) 200 MG tablet Take 200 mg by mouth daily.   Yes [provider]  levothyroxine (SYNTHROID, LEVOTHROID) 150 MCG tablet Take 150 mcg by mouth daily. 07/17/17  Yes [provider]  lurasidone (LATUDA) 80 MG TABS tablet Take 80 mg by mouth daily with breakfast.   Yes [provider]  magnesium oxide (MAG-OX) 400 MG tablet Take 400 mg daily by mouth.   Yes [provider]  Melatonin 3 MG  TABS Take 6 mg by mouth at bedtime.    Yes [provider]  metoprolol succinate (TOPROL-XL) 50 MG 24 hr tablet Take 50 mg by mouth daily. Take with or immediately following a meal.   Yes [provider]  Multiple Vitamin (MULTIVITAMIN WITH MINERALS) TABS Take 1 tablet by mouth daily.   Yes [provider]  omega-3 acid ethyl esters (LOVAZA) 1 g capsule Take 2 g by mouth 2 (two) times daily.   Yes [provider]  omeprazole (PRILOSEC) 20 MG capsule Take 20 mg by mouth daily.   Yes [provider]  pantoprazole (PROTONIX) 40 MG tablet Take 40 mg by mouth daily.   Yes [provider]  phenazopyridine (PYRIDIUM) 100 MG tablet Take 100 mg by mouth 3 (three) times daily as needed for pain.   Yes [provider]  pregabalin (LYRICA) 50 MG capsule Take 50 mg by mouth 3 (three) times daily.   Yes [provider]  QUEtiapine (SEROQUEL) 100 MG tablet Take 100 mg by mouth at bedtime. Take with 1/2 tablet of Seroquel 50 mg to equal 125 mg   Yes [provider]  QUEtiapine (SEROQUEL) 50 MG tablet Take 25 mg by mouth at bedtime. Take with seroquel 100 mg to equal 125  mg   Yes [provider]  topiramate (TOPAMAX) 25 MG tablet Take 75 mg by mouth 2 (two) times daily.   Yes [provider]  TRAZODONE HCL PO Take 1 tablet by mouth at bedtime.   Yes [provider]  venlafaxine XR (EFFEXOR-XR) 75 MG 24 hr capsule Take 225 mg by mouth daily with breakfast.   Yes [provider]  Vitamin D, Ergocalciferol, (DRISDOL) 50000 UNITS CAPS Take 50,000 Units by mouth every 7 (seven) days. On Sunday   Yes [provider]    Physical Exam: Vitals:   08/26/17 0215 08/26/17 0230 08/26/17 0315 08/26/17 0400  BP: 114/85 113/80 114/83 98/79  Pulse: 97  95 97  Resp: 13 14 13 15   Temp:      TempSrc:      SpO2: 98%  97% 95%      Constitutional: NAD, calm Eyes: PERTLA, lids and conjunctivae normal ENMT: Mucous membranes are moist. Posterior pharynx clear of any exudate or lesions.   Neck: normal, supple, no masses, no thyromegaly Respiratory: clear to auscultation bilaterally, no wheezing, no crackles. Normal respiratory effort.    Cardiovascular: S1 & S2 heard, regular rate and rhythm. No extremity edema.   Abdomen: No distension, no tenderness, soft. Bowel sounds normal.  Musculoskeletal: no clubbing / cyanosis. No joint deformity upper and lower extremities.   Skin: no significant rashes, lesions, ulcers. Warm, dry, well-perfused. Neurologic: Gross hearing deficit. Sensation intact. Moving all extremities.    Labs on Admission: I have personally reviewed following labs and imaging studies  CBC: Recent Labs  Lab 08/25/17 1520  WBC 10.1  HGB 14.3  HCT 41.7  MCV 92.3  PLT 622   Basic Metabolic Panel: Recent Labs  Lab 08/25/17 1520  NA 137  K 4.2  CL 105  CO2 18*  GLUCOSE 96  BUN 30*  CREATININE 2.22*  CALCIUM 10.3   GFR: CrCl cannot be calculated (Unknown ideal weight.). Liver Function Tests: Recent Labs  Lab 08/25/17 1520  AST 27  ALT 33  ALKPHOS 78  BILITOT 0.6  PROT 7.4  ALBUMIN 4.1    Recent Labs  Lab 08/25/17 1520  LIPASE 47   No results for  input(s): AMMONIA in the last 168 hours. Coagulation Profile: No results for input(s): INR, PROTIME in the last 168 hours. Cardiac Enzymes: No results for input(s): CKTOTAL, CKMB, CKMBINDEX, TROPONINI in the last 168 hours. BNP (last 3 results) No results for input(s): PROBNP in the last 8760 hours. HbA1C: No results for input(s): HGBA1C in the last 72 hours. CBG: No results for input(s): GLUCAP in the last 168 hours. Lipid Profile: No results for input(s): CHOL, HDL, LDLCALC, TRIG, CHOLHDL, LDLDIRECT in the last 72 hours. Thyroid Function Tests: No results for input(s): TSH, T4TOTAL, FREET4, T3FREE, THYROIDAB in the last 72 hours. Anemia Panel: No results for input(s): VITAMINB12, FOLATE, FERRITIN, TIBC, IRON, RETICCTPCT in the last 72 hours. Urine analysis:    Component Value Date/Time   COLORURINE YELLOW 08/03/2017 1310   APPEARANCEUR CLEAR 08/03/2017 1310   LABSPEC 1.010 08/03/2017 1310   PHURINE 5.0 08/03/2017 1310   GLUCOSEU NEGATIVE 08/03/2017 1310   HGBUR NEGATIVE 08/03/2017 1310   BILIRUBINUR NEGATIVE 08/03/2017 1310   KETONESUR NEGATIVE 08/03/2017 1310   PROTEINUR NEGATIVE 08/03/2017 1310   UROBILINOGEN 0.2 12/14/2012 0816   NITRITE NEGATIVE 08/03/2017 1310   LEUKOCYTESUR NEGATIVE 08/03/2017 1310   Sepsis Labs: @LABRCNTIP (procalcitonin:4,lacticidven:4) )No results found for this or any previous visit (from the past 240 hour(s)).   Radiological Exams on Admission: Ct Head Wo Contrast  Result Date: 08/26/2017 CLINICAL DATA:  Syncopal episode last evening. History of seizure and migraine headache. EXAM: CT HEAD WITHOUT CONTRAST TECHNIQUE: Contiguous axial images were obtained from the base of the skull through the vertex without intravenous contrast. COMPARISON:  None. FINDINGS: BRAIN: The ventricles and sulci are normal. No intraparenchymal hemorrhage, mass effect nor midline shift. No acute large  vascular territory infarcts. Grey-white matter distinction is maintained. The basal ganglia are unremarkable. No abnormal extra-axial fluid collections. Basal cisterns are not effaced and midline. The brainstem and cerebellar hemispheres are without acute abnormalities. VASCULAR: Unremarkable. SKULL/SOFT TISSUES: No skull fracture. No significant soft tissue swelling. ORBITS/SINUSES: The included ocular globes and orbital contents are normal.The mastoid air cells are clear. The included paranasal sinuses are well-aerated. OTHER: Metallic artifacts from bilateral cochlear implants. IMPRESSION: No acute intracranial abnormality.  Bilateral cochlear implants. Electronically Signed   By: Ashley Royalty M.D.   On: 08/26/2017 03:52   Dg Knee Complete 4 Views Left  Result Date: 08/26/2017 CLINICAL DATA:  42 year old female with fall and left knee pain. EXAM: LEFT KNEE - COMPLETE 4+ VIEW COMPARISON:  None. FINDINGS: There is no acute fracture or dislocation. The bones are well mineralized. No arthritic changes. An area of sclerotic changes in the distal femoral diaphysis is not well characterized but appears nonaggressive. Direct comparison with prior images, if available, recommended. There is no joint effusion. The soft tissues appear unremarkable. IMPRESSION: Negative. Electronically Signed   By: Anner Crete M.D.   On: 08/26/2017 03:19    EKG: Not performed.   Assessment/Plan   1. Acute kidney injury superimposed on CKD stage III  - Presents after a syncopal episode on standing that occurred 08/24/17 and is found to have SCr of 2.20, up from apparent baseline of ~1.5  - Likely prerenal azotemia given report of not eating or drinking for several days while abusing laxatives  - Hydrated with NS in ED  - Check urine chemistries, continue IVF hydration, renally-dose medications, avoid nephrotoxins, repeat chemistry panel in am   2. Depression with psychosis   - Denies SI, HI, or active hallucinations  -  Continue Effexor, Latuda,  trazodone, Seroquel, antiepileptics   - Social work consultation requested given behavioral health issues leading to hospitalization   3. Hypothyroidism  - Continue Synthroid   4. Syncope  - Reports a syncopal episode upon standing on 08/24/17  - Likely orthostatic in setting of laxative abuse and anorexia    - Check orthostatic vitals, continue IVF hydration     DVT prophylaxis: sq heparin  Code Status: Full  Family Communication: Discussed with patient Consults called: None  Admission status: Observation     Vianne Bulls, MD [facility] Pager [phone]  If 7PM-7AM, please contact night-coverage [email or web address] Password [email or web address]  08/26/2017, 4:40 AM

## 2017-08-26 NOTE — ED Provider Notes (Signed)
[facility] EMERGENCY DEPARTMENT Provider Note   CSN: [id number] Arrival date & time: 08/25/17  1516     History   Chief Complaint Chief Complaint  Patient presents with  . Leg Pain  . Drug Overdose    HPI Brandi Love is a 42 y.o. female.  The history is provided by the patient.  Leg Pain   This is a new problem. The current episode started yesterday. The problem occurs constantly. The problem has not changed since onset.Pain location: left keep, hit it when she syncopized  The pain is moderate. Pertinent negatives include no numbness. The symptoms are aggravated by contact. She has tried nothing for the symptoms. The treatment provided no relief. There has been a history of trauma. Family history is significant for no rheumatoid arthritis.  Drug Overdose  This is a chronic problem. The current episode started more than 1 week ago. The problem occurs constantly. The problem has not changed since onset.Pertinent negatives include no chest pain and no shortness of breath. Nothing aggravates the symptoms. Nothing relieves the symptoms. She has tried nothing for the symptoms. The treatment provided no relief.  Take 10 dulcolax a day   Past Medical History:  Diagnosis Date  . Anemia   . Anorexia   . Anxiety   . Arthritis    "hands" (09/18/2016)  . Chronic kidney disease   . Chronic lower back pain   . Chronic renal insufficiency   . Deaf   . Deafness   . Depression   . Diabetes ([place])    "borderline"  . Eating disorder   . Endometriosis   . GERD (gastroesophageal reflux disease)   . History of kidney stones   . HTN (hypertension)   . Hyperlipidemia   . Hypertension   . 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 Migraine    "frequency varies" (09/18/2016)  . OCD (obsessive compulsive disorder)   . Pneumonia 2011  . PONV (postoperative nausea and vomiting)   . Pulmonary embolism ([place]) 10/2001  . Seizures ([place]) 09/2001 X 1   "day after my  hysterectomy"  . Seizures ([place])   . Tachycardia     Patient Active Problem List   Diagnosis Date Noted  . Urinary tract infection with hematuria   . OCD (obsessive compulsive disorder) 09/29/2016  . Eating disorder 09/29/2016  . Deafness congenital 09/29/2016  . Intractable nausea and vomiting 09/29/2016  . Bloody diarrhea 09/29/2016  . AKI (acute kidney injury) ([place]) 09/29/2016  . Severe dehydration 09/29/2016  . Severe protein-calorie malnutrition ([place]) 09/29/2016  . Congenital hypothyroidism 09/29/2016  . Hypertriglyceridemia 09/29/2016  . Anemia 09/29/2016  . Endometriosis 09/29/2016  . MDD (major depressive disorder) 09/29/2016  . Hyperlipidemia 09/29/2016  . CKD (chronic kidney disease), stage III ([place]) 09/29/2016  . Acute kidney injury ([place]) 09/29/2016  . OAB (overactive bladder) 09/29/2016  . Sepsis ([place]) 09/18/2016  . Acute pyelonephritis 09/18/2016  . MDD (major depressive disorder), recurrent, severe, with psychosis ([place]) 12/11/2014  . Eating disorder, unspecified 11/23/2012  . Hypothyroidism 11/23/2012  . Dyslipidemia 11/23/2012  . Sinus tachycardia 11/23/2012  . CKD (chronic kidney disease) stage 3, GFR 30-59 ml/min ([facility]) 11/18/2012  . Protein-calorie malnutrition, severe ([place]) 11/13/2012  . Abdominal pain, epigastric 11/12/2012  . Dehydration 11/12/2012  . Acute on chronic renal failure ([place]) 11/12/2012  . Leukocytosis, unspecified 11/12/2012  . Hyponatremia 11/12/2012  . Borderline personality disorder ([place]) 11/12/2012    Past Surgical History:  Procedure Laterality  Date  . ABDOMINAL HYSTERECTOMY  09/2001   'except for right ovary'  . BREAST LUMPECTOMY Left 2000   benign  . BREAST LUMPECTOMY Right 2002   benign  . COCHLEAR IMPLANT Bilateral 2004-2010   "right-left"  . COLONOSCOPY  06/2015    Dr Rolm Bookbinder at [facility] for rectal bleeding: internal hemorrhoids, small anal fissure.    . ESOPHAGOGASTRODUODENOSCOPY N/A 10/07/2016   Procedure:  ESOPHAGOGASTRODUODENOSCOPY (EGD);  Surgeon: Ladene Artist, MD;  Location: [facility] ENDOSCOPY;  Service: Endoscopy;  Laterality: N/A;  . EYE MUSCLE SURGERY Left 1980   "lazy eye"  . EYE MUSCLE SURGERY Right 1990   "lazy eye"  . LAPAROSCOPIC ABDOMINAL EXPLORATION  2001 and 2002   for endometriosis     OB History   None      Home Medications    Prior to Admission medications   Medication Sig Start Date End Date Taking? Authorizing Provider  aspirin 81 MG chewable tablet Chew by mouth daily.    [provider]  aspirin EC 81 MG tablet Take 81 mg by mouth daily.    [provider]  atorvastatin (LIPITOR) 20 MG tablet Take 20 mg by mouth daily.    [provider]  cephALEXin (KEFLEX) 500 MG capsule Take 1,000 mg by mouth 2 (two) times daily. 10 day course started 07/28/17 07/28/17   [provider]  docusate sodium (COLACE) 100 MG capsule Take 100 mg by mouth 2 (two) times daily.    [provider]  docusate sodium (COLACE) 100 MG capsule Take 100 mg by mouth 2 (two) times daily.    [provider]  feeding supplement (BOOST / RESOURCE BREEZE) LIQD Take 1 Container by mouth 3 (three) times daily between meals. Patient not taking: Reported on 11/30/2016 10/11/16   Elgergawy, Silver Huguenin, MD  feeding supplement (BOOST / RESOURCE BREEZE) LIQD Take 1 Container by mouth 3 (three) times daily between meals. Patient not taking: Reported on 11/30/2016 10/11/16   Elgergawy, Silver Huguenin, MD  fluticasone (FLONASE) 50 MCG/ACT nasal spray Place 2 sprays into both nostrils daily.  05/03/17   [provider]  gemfibrozil (LOPID) 600 MG tablet Take 600 mg by mouth 2 (two) times daily before a meal.    [provider]  gemfibrozil (LOPID) 600 MG tablet Take 600 mg by mouth See admin instructions. Taking one tablet (600mg ) in the AM and 1/2 Tab (300mg ) in the evening. 07/17/17   [provider]  haloperidol (HALDOL) 5 MG tablet Take 5 mg by mouth  at bedtime.     [provider]  haloperidol (HALDOL) 5 MG tablet Take 2.5-5 mg by mouth 2 (two) times daily. Takes 2.5 mg in the am and 5 mg nightly. 07/17/17   [provider]  hydrOXYzine (ATARAX/VISTARIL) 50 MG tablet Take 50 mg 3 (three) times daily as needed by mouth.    [provider]  lamoTRIgine (LAMICTAL) 200 MG tablet Take 200 mg by mouth daily.    [provider]  lamoTRIgine (LAMICTAL) 200 MG tablet Take 200 mg by mouth daily. 07/17/17   [provider]  levothyroxine (SYNTHROID, LEVOTHROID) 150 MCG tablet Take 150 mcg by mouth daily.    [provider]  levothyroxine (SYNTHROID, LEVOTHROID) 150 MCG tablet Take 150 mcg by mouth daily. 07/17/17   [provider]  lurasidone (LATUDA) 20 MG TABS tablet Take 20 mg by mouth daily.     [provider]  lurasidone (LATUDA) 20 MG TABS  tablet Take 20 mg by mouth daily.    [provider]  magnesium oxide (MAG-OX) 400 MG tablet Take 400 mg daily by mouth.    [provider]  Magnesium Oxide 400 (240 Mg) MG TABS Take by mouth.    [provider]  Melatonin 3 MG TABS Take 3 mg by mouth at bedtime as needed (sleep).    [provider]  Multiple Vitamin (MULTIVITAMIN WITH MINERALS) TABS tablet Take 1 tablet by mouth daily.    [provider]  Multiple Vitamin (MULTIVITAMIN WITH MINERALS) TABS Take 1 tablet by mouth daily.    [provider]  Nutritional Supplements (FEEDING SUPPLEMENT, OSMOLITE 1.2 CAL,) LIQD Place 1,000 mLs into feeding tube continuous. Patient on nocturnal tube feeds from 7 PM to 7 AM at 40 mL per hour via core track Patient not taking: Reported on 11/30/2016 10/11/16   Elgergawy, Silver Huguenin, MD  ondansetron (ZOFRAN) 4 MG tablet Take 4 mg by mouth every 8 (eight) hours as needed for nausea.    [provider]  pantoprazole (PROTONIX) 40 MG tablet Take 40 mg by mouth daily.    [provider]    pantoprazole (PROTONIX) 40 MG tablet Take 40 mg by mouth daily. 07/17/17   [provider]  pregabalin (LYRICA) 50 MG capsule Take 50 mg by mouth 3 (three) times daily.    [provider]  pregabalin (LYRICA) 50 MG capsule Take 50 mg by mouth 3 (three) times daily.    [provider]  QUEtiapine Fumarate (SEROQUEL XR) 150 MG 24 hr tablet Take 150 mg by mouth at bedtime.    [provider]  QUEtiapine Fumarate (SEROQUEL XR) 150 MG 24 hr tablet Take 150 mg by mouth at bedtime. 07/17/17   [provider]  topiramate (TOPAMAX) 50 MG tablet Take 50 mg by mouth 2 (two) times daily.     [provider]  topiramate (TOPAMAX) 50 MG tablet Take 50 mg by mouth 2 (two) times daily. 07/17/17   [provider]  venlafaxine XR (EFFEXOR-XR) 150 MG 24 hr capsule Take 150 mg by mouth daily with breakfast.    [provider]  venlafaxine XR (EFFEXOR-XR) 150 MG 24 hr capsule Take 150 mg by mouth daily with breakfast. 07/17/17   [provider]  Vitamin D, Ergocalciferol, (DRISDOL) 50000 UNITS CAPS Take 50,000 Units by mouth every 7 (seven) days. On Sunday    [provider]    Family History Family History  Problem Relation Age of Onset  . Cancer Father   . Hyperlipidemia Father   . Osteoarthritis Mother   . Hyperlipidemia Mother     Social History Social History   Tobacco Use  . Smoking status: Never Smoker  . Smokeless tobacco: Never Used  Substance Use Topics  . Alcohol use: Never    Frequency: Never  . Drug use: Never     Allergies   Dhea [nutritional supplements]; Entex lq [phenylephrine-guaifenesin]; Indomethacin; Iodinated diagnostic agents; Melatonin; Phenylephrine-guaifenesin; Propoxyphene; Risperidone and related; Shellfish allergy; Depakote [divalproex sodium]; Abilify [aripiprazole]; Dihydroergotamine; Divalproex sodium; Doxycycline; Erythromycin; Indomethacin; Lactose intolerance (gi); Melatonin;  Nsaids; Omega 3 [dha-epa-vitamin e]; Prasterone (dhea) [dhea]; Risperidone and related; Shellfish allergy; Statins; Tape; Abilify [aripiprazole]; Dihydroergotamine; Doxycycline; Erythromycin; Lactose intolerance (gi); Nsaids; and Tape   Review of Systems Review of Systems  Constitutional: Negative for fever.  Eyes: Negative for visual disturbance.  Respiratory: Negative for chest tightness and shortness of breath.   Cardiovascular: Negative for chest pain,  palpitations and leg swelling.  Genitourinary: Negative for dysuria and frequency.  Neurological: Negative for numbness.  All other systems reviewed and are negative.    Physical Exam Updated Vital Signs BP 115/84   Pulse (!) 103   Temp 98.7 F (37.1 C) (Oral)   Resp 16   LMP  (LMP Unknown)   SpO2 97%   Physical Exam  Constitutional: She is oriented to person, place, and time. She appears well-developed and well-nourished. No distress.  HENT:  Head: Normocephalic and atraumatic.  Nose: Nose normal.  Mouth/Throat: No oropharyngeal exudate.  Eyes: Pupils are equal, round, and reactive to light. Conjunctivae are normal.  Neck: Normal range of motion. Neck supple.  Cardiovascular: Regular rhythm, normal heart sounds and intact distal pulses. Tachycardia present.  Pulmonary/Chest: Effort normal and breath sounds normal. No stridor. She has no wheezes. She has no rales.  Abdominal: Soft. Bowel sounds are normal. She exhibits no mass. There is no tenderness. There is no guarding.  Musculoskeletal: Normal range of motion.  Neurological: She is alert and oriented to person, place, and time. She displays normal reflexes. She exhibits normal muscle tone.  Skin: Skin is warm and dry. Capillary refill takes less than 2 seconds.  Psychiatric: She has a normal mood and affect.     ED Treatments / Results  Labs (all labs ordered are listed, but only abnormal results are displayed) Results for orders placed or performed during the  hospital encounter of 08/26/17  Lipase, blood  Result Value Ref Range   Lipase 47 11 - 51 U/L  Comprehensive metabolic panel  Result Value Ref Range   Sodium 137 135 - 145 mmol/L   Potassium 4.2 3.5 - 5.1 mmol/L   Chloride 105 101 - 111 mmol/L   CO2 18 (L) 22 - 32 mmol/L   Glucose, Bld 96 65 - 99 mg/dL   BUN 30 (H) 6 - 20 mg/dL   Creatinine, Ser 2.22 (H) 0.44 - 1.00 mg/dL   Calcium 10.3 8.9 - 10.3 mg/dL   Total Protein 7.4 6.5 - 8.1 g/dL   Albumin 4.1 3.5 - 5.0 g/dL   AST 27 15 - 41 U/L   ALT 33 14 - 54 U/L   Alkaline Phosphatase 78 38 - 126 U/L   Total Bilirubin 0.6 0.3 - 1.2 mg/dL   GFR calc non Af Amer 27 (L) >60 mL/min   GFR calc Af Amer 31 (L) >60 mL/min   Anion gap 14 5 - 15  CBC  Result Value Ref Range   WBC 10.1 4.0 - 10.5 K/uL   RBC 4.52 3.87 - 5.11 MIL/uL   Hemoglobin 14.3 12.0 - 15.0 g/dL   HCT 41.7 36.0 - 46.0 %   MCV 92.3 78.0 - 100.0 fL   MCH 31.6 26.0 - 34.0 pg   MCHC 34.3 30.0 - 36.0 g/dL   RDW 12.9 11.5 - 15.5 %   Platelets 317 150 - 400 K/uL  Acetaminophen level  Result Value Ref Range   Acetaminophen (Tylenol), Serum <10 (L) 10 - 30 ug/mL  Salicylate level  Result Value Ref Range   Salicylate Lvl <1.4 2.8 - 30.0 mg/dL  I-Stat beta hCG blood, ED  Result Value Ref Range   I-stat hCG, quantitative <5.0 <5 mIU/mL   Comment 3           Ct Head Wo Contrast  Result Date: 08/02/2017 CLINICAL DATA:  Neck trauma, attempted suicide EXAM: CT HEAD WITHOUT CONTRAST CT CERVICAL SPINE WITHOUT CONTRAST  TECHNIQUE: Multidetector CT imaging of the head and cervical spine was performed following the standard protocol without intravenous contrast. Multiplanar CT image reconstructions of the cervical spine were also generated. COMPARISON:  None. FINDINGS: CT HEAD FINDINGS Brain: No evidence of acute infarction, hemorrhage, hydrocephalus, extra-axial collection or mass lesion/mass effect. Vascular: No hyperdense vessel or unexpected calcification. Skull: Normal. Negative for  fracture or focal lesion. Sinuses/Orbits: The visualized paranasal sinuses are essentially clear. The mastoid air cells are unopacified. Prior partial right mastoidectomy. Bilateral cochlear implants. Other: None. CT CERVICAL SPINE FINDINGS Alignment: Normal cervical lordosis. Skull base and vertebrae: No acute fracture. No primary bone lesion or focal pathologic process. Soft tissues and spinal canal: No prevertebral fluid or swelling. No visible canal hematoma. Disc levels: Mild degenerative changes at C5-6. Spinal canal is patent. Upper chest: Visualized lung apices are clear. Other: None. IMPRESSION: Normal head CT. No evidence of traumatic injury to the cervical spine. Electronically Signed   By: Julian Hy M.D.   On: 08/02/2017 20:54   Ct Cervical Spine Wo Contrast  Result Date: 08/02/2017 CLINICAL DATA:  Neck trauma, attempted suicide EXAM: CT HEAD WITHOUT CONTRAST CT CERVICAL SPINE WITHOUT CONTRAST TECHNIQUE: Multidetector CT imaging of the head and cervical spine was performed following the standard protocol without intravenous contrast. Multiplanar CT image reconstructions of the cervical spine were also generated. COMPARISON:  None. FINDINGS: CT HEAD FINDINGS Brain: No evidence of acute infarction, hemorrhage, hydrocephalus, extra-axial collection or mass lesion/mass effect. Vascular: No hyperdense vessel or unexpected calcification. Skull: Normal. Negative for fracture or focal lesion. Sinuses/Orbits: The visualized paranasal sinuses are essentially clear. The mastoid air cells are unopacified. Prior partial right mastoidectomy. Bilateral cochlear implants. Other: None. CT CERVICAL SPINE FINDINGS Alignment: Normal cervical lordosis. Skull base and vertebrae: No acute fracture. No primary bone lesion or focal pathologic process. Soft tissues and spinal canal: No prevertebral fluid or swelling. No visible canal hematoma. Disc levels: Mild degenerative changes at C5-6. Spinal canal is patent.  Upper chest: Visualized lung apices are clear. Other: None. IMPRESSION: Normal head CT. No evidence of traumatic injury to the cervical spine. Electronically Signed   By: Julian Hy M.D.   On: 08/02/2017 20:54   Dg Chest Port 1 View  Result Date: 08/02/2017 CLINICAL DATA:  Chest pain after trauma EXAM: PORTABLE CHEST 1 VIEW COMPARISON:  None. FINDINGS: Lungs are clear. Heart size and pulmonary vascularity are normal. No adenopathy. No pneumothorax. No bone lesions. IMPRESSION: No edema or consolidation.  No pneumothorax. Electronically Signed   By: Lowella Grip III M.D.   On: 08/02/2017 19:20   EKG None  Radiology No results found.  Procedures Procedures (including critical care time)  Medications Ordered in ED Medications  0.9 %  sodium chloride infusion ( Intravenous New Bag/Given 08/26/17 0135)  Tdap (BOOSTRIX) injection 0.5 mL (has no administration in time range)       Final Clinical Impressions(s) / ED Diagnoses   Final diagnoses:  Syncope and collapse  Anorexia nervosa with bulimia  Acute renal failure with tubular necrosis ([place])  Dehydration    Admit to medicine for dehydration and renal insufficency    Sayeed Weatherall, MD 08/26/17 1700

## 2017-08-26 NOTE — ED Notes (Addendum)
Pt requesting assistance to take her bra off, assisted with this. She ate 4 packs of saltine crackers. Resting comfortably in bed. Asked for urine sample, reports she can't provide at this time. She is drinking water, will continue to encourage.

## 2017-08-26 NOTE — Progress Notes (Signed)
Admission note:  Arrival Method: Patient arrived from [facility] in w/c. Mental Orientation:  Alert and oriented x 4. Telemetry: N/A Assessment: See doc flow sheets. Skin: healed cut scars noted on bilateral thighs and lower legs.   IV: Right FA NS 125 ml/hr. Pain: N/A Tubes: N/A Safety Measures: bed in low position, call bell and phone within reach. Fall Prevention Safety Plan: Reviewed the plan, verbalized understanding. Admission Screening: In process 6700 Orientation: Patient has been oriented to the unit, staff and to the room.

## 2017-08-26 NOTE — ED Notes (Signed)
Gave patient some water and some crackers

## 2017-08-26 NOTE — ED Notes (Signed)
CBG 87, pt is eating saltine crackers.

## 2017-08-27 ENCOUNTER — Other Ambulatory Visit: Payer: Self-pay

## 2017-08-27 DIAGNOSIS — H9190 Unspecified hearing loss, unspecified ear: Secondary | ICD-10-CM | POA: Diagnosis present

## 2017-08-27 DIAGNOSIS — F5002 Anorexia nervosa, binge eating/purging type: Secondary | ICD-10-CM | POA: Diagnosis not present

## 2017-08-27 DIAGNOSIS — F603 Borderline personality disorder: Secondary | ICD-10-CM | POA: Diagnosis present

## 2017-08-27 DIAGNOSIS — Z79890 Hormone replacement therapy (postmenopausal): Secondary | ICD-10-CM | POA: Diagnosis not present

## 2017-08-27 DIAGNOSIS — F429 Obsessive-compulsive disorder, unspecified: Secondary | ICD-10-CM | POA: Diagnosis present

## 2017-08-27 DIAGNOSIS — E038 Other specified hypothyroidism: Secondary | ICD-10-CM | POA: Diagnosis not present

## 2017-08-27 DIAGNOSIS — N183 Chronic kidney disease, stage 3 (moderate): Secondary | ICD-10-CM | POA: Diagnosis present

## 2017-08-27 DIAGNOSIS — E860 Dehydration: Secondary | ICD-10-CM | POA: Diagnosis present

## 2017-08-27 DIAGNOSIS — N179 Acute kidney failure, unspecified: Secondary | ICD-10-CM | POA: Diagnosis present

## 2017-08-27 DIAGNOSIS — F552 Abuse of laxatives: Secondary | ICD-10-CM | POA: Diagnosis present

## 2017-08-27 DIAGNOSIS — Z90710 Acquired absence of both cervix and uterus: Secondary | ICD-10-CM | POA: Diagnosis not present

## 2017-08-27 DIAGNOSIS — R55 Syncope and collapse: Secondary | ICD-10-CM | POA: Diagnosis present

## 2017-08-27 DIAGNOSIS — Z79899 Other long term (current) drug therapy: Secondary | ICD-10-CM | POA: Diagnosis not present

## 2017-08-27 DIAGNOSIS — Z9621 Cochlear implant status: Secondary | ICD-10-CM | POA: Diagnosis present

## 2017-08-27 DIAGNOSIS — F502 Bulimia nervosa: Secondary | ICD-10-CM | POA: Diagnosis present

## 2017-08-27 DIAGNOSIS — Z7982 Long term (current) use of aspirin: Secondary | ICD-10-CM | POA: Diagnosis not present

## 2017-08-27 DIAGNOSIS — E039 Hypothyroidism, unspecified: Secondary | ICD-10-CM | POA: Diagnosis present

## 2017-08-27 DIAGNOSIS — F5000 Anorexia nervosa, unspecified: Secondary | ICD-10-CM | POA: Diagnosis present

## 2017-08-27 DIAGNOSIS — Z7951 Long term (current) use of inhaled steroids: Secondary | ICD-10-CM | POA: Diagnosis not present

## 2017-08-27 DIAGNOSIS — F333 Major depressive disorder, recurrent, severe with psychotic symptoms: Secondary | ICD-10-CM | POA: Diagnosis present

## 2017-08-27 DIAGNOSIS — Z23 Encounter for immunization: Secondary | ICD-10-CM | POA: Diagnosis present

## 2017-08-27 DIAGNOSIS — N170 Acute kidney failure with tubular necrosis: Secondary | ICD-10-CM | POA: Diagnosis present

## 2017-08-27 DIAGNOSIS — N189 Chronic kidney disease, unspecified: Secondary | ICD-10-CM | POA: Diagnosis not present

## 2017-08-27 LAB — BASIC METABOLIC PANEL
Anion gap: 9 (ref 5–15)
BUN: 28 mg/dL — AB (ref 6–20)
CO2: 19 mmol/L — ABNORMAL LOW (ref 22–32)
Calcium: 9.2 mg/dL (ref 8.9–10.3)
Chloride: 114 mmol/L — ABNORMAL HIGH (ref 101–111)
Creatinine, Ser: 1.71 mg/dL — ABNORMAL HIGH (ref 0.44–1.00)
GFR calc Af Amer: 42 mL/min — ABNORMAL LOW (ref >60)
GFR, EST NON AFRICAN AMERICAN: 37 mL/min — AB (ref >60)
GLUCOSE: 86 mg/dL (ref 65–99)
POTASSIUM: 3.3 mmol/L — AB (ref 3.5–5.1)
Sodium: 142 mmol/L (ref 135–145)

## 2017-08-27 LAB — CBC
HEMATOCRIT: 34.2 % — AB (ref 36.0–46.0)
Hemoglobin: 11.5 g/dL — ABNORMAL LOW (ref 12.0–15.0)
MCH: 31 pg (ref 26.0–34.0)
MCHC: 33.6 g/dL (ref 30.0–36.0)
MCV: 92.2 fL (ref 78.0–100.0)
Platelets: 229 10*3/uL (ref 150–400)
RBC: 3.71 MIL/uL — ABNORMAL LOW (ref 3.87–5.11)
RDW: 12.7 % (ref 11.5–15.5)
WBC: 5.7 10*3/uL (ref 4.0–10.5)

## 2017-08-27 LAB — UREA NITROGEN, URINE: Urea Nitrogen, Ur: 351 mg/dL

## 2017-08-27 LAB — GLUCOSE, CAPILLARY: Glucose-Capillary: 84 mg/dL (ref 65–99)

## 2017-08-27 MED ORDER — POTASSIUM CHLORIDE IN NACL 20-0.9 MEQ/L-% IV SOLN
INTRAVENOUS | Status: DC
  Administered 2017-08-27 – 2017-08-28 (×3): via INTRAVENOUS
  Filled 2017-08-27 (×3): qty 1000

## 2017-08-27 MED ORDER — SODIUM CHLORIDE 0.9 % IV SOLN
INTRAVENOUS | Status: DC
  Administered 2017-08-27: 08:00:00 via INTRAVENOUS

## 2017-08-27 MED ORDER — POTASSIUM CHLORIDE CRYS ER 20 MEQ PO TBCR
40.0000 meq | EXTENDED_RELEASE_TABLET | Freq: Once | ORAL | Status: AC
  Administered 2017-08-27: 40 meq via ORAL
  Filled 2017-08-27: qty 2

## 2017-08-27 MED ORDER — ENSURE ENLIVE PO LIQD
237.0000 mL | Freq: Three times a day (TID) | ORAL | Status: DC
  Administered 2017-08-27 – 2017-08-29 (×6): 237 mL via ORAL

## 2017-08-27 NOTE — Progress Notes (Signed)
Requested for salad to eat this evening but made her self vomit." Its too much to eat . " in bed at present resting.

## 2017-08-27 NOTE — Progress Notes (Signed)
PROGRESS NOTE    Brandi Love  DXA:[id number] DOB: 1976-02-10 DOA: 08/26/2017 PCP: Vickii Penna, MD   Outpatient Specialists:     Brief Narrative:  Brandi Love is a 42 y.o. female with medical history significant for depression with psychosis, borderline personality disorder, OCD, hypothyroidism, deafness with cochlear implants, and chronic kidney disease stage III, now presenting to the emergency department after syncopal episode on 08/24/2017.  Patient reports that she has not been eating or drinking except for sips of water with laxatives.  She reports taking excessive laxatives over the past several days and acknowledges a history of eating disorder and laxative abuse.  She did not have any chest pain or palpitations prior to the syncopal episode.  She reports hitting her head but not losing consciousness and has had a mild headache and knee pain.  Denies suicidal or homicidal ideation, and denies active hallucinations.     Assessment & Plan:   Principal Problem:   Acute on chronic renal failure (HCC) Active Problems:   Hypothyroidism   MDD (major depressive disorder), recurrent, severe, with psychosis ([place])   OCD (obsessive compulsive disorder)   Anorexia nervosa with bulimia   CKD (chronic kidney disease), stage III ([place])   Syncope   Acute renal failure superimposed on stage 3 chronic kidney disease (HCC)   Acute kidney injury superimposed on CKD stage III  - Presents after a syncopal episode on standing that occurred 08/24/17 and is found to have SCr of 2.20, up from apparent baseline of ~1.5  - Likely prerenal azotemia given report of not eating or drinking for several days while abusing laxatives  -continue hydration as still orthostatic  H/o Depression with psychosis and borderline personality  - Denies SI, HI, or active hallucinations  - home meds - Social work consultation requested given behavioral health issues leading to hospitalization    Hypothyroidism    - Continue Synthroid   Orthostatic Syncope  - Reports a syncopal episode upon standing on 08/24/17  - Likely orthostatic in setting of laxative abuse and anorexia    - improved today but still positive     DVT prophylaxis:  SQ Heparin  Code Status: Full Code   Family Communication:   Disposition Plan:     Consultants:      Subjective: Got dizzy when they did orthostatics earlier  Objective: Vitals:   08/26/17 2103 08/27/17 0532 08/27/17 0821 08/27/17 0822  BP: 113/81 91/61 106/76 106/78  Pulse: 91 87 80 81  Resp:   16   Temp: 98.8 F (37.1 C) 98.2 F (36.8 C) 98.4 F (36.9 C) 98.2 F (36.8 C)  TempSrc: Oral Axillary Oral Oral  SpO2: 100% 94% 97% 97%  Weight: 74.6 kg (164 lb 7.4 oz)       Intake/Output Summary (Last 24 hours) at 08/27/2017 1444 Last data filed at 08/27/2017 2094 Gross per 24 hour  Intake 1374.58 ml  Output 0 ml  Net 1374.58 ml   Filed Weights   08/26/17 2103  Weight: 74.6 kg (164 lb 7.4 oz)    Examination:  General exam: NAD, in bed, hard of hearing even with cochlear implants on-- reads lips Respiratory system: Clear , no increase work of breathing Cardiovascular system: rrr Gastrointestinal system: +Bs, soft Central nervous system: Alert and oriented.     Data Reviewed: I have personally reviewed following labs and imaging studies  CBC: Recent Labs  Lab 08/25/17 1520 08/27/17 0500  WBC 10.1 5.7  HGB 14.3 11.5*  HCT 41.7 34.2*  MCV 92.3 92.2  PLT 317 818   Basic Metabolic Panel: Recent Labs  Lab 08/25/17 1520 08/26/17 0616 08/27/17 0500  NA 137 138 142  K 4.2 3.8 3.3*  CL 105 110 114*  CO2 18* 15* 19*  GLUCOSE 96 91 86  BUN 30* 32* 28*  CREATININE 2.22* 2.03* 1.71*  CALCIUM 10.3 9.8 9.2  MG  --  2.3  --   PHOS  --  5.4*  --    GFR: Estimated Creatinine Clearance: 44.6 mL/min (A) (by C-G formula based on SCr of 1.71 mg/dL (H)). Liver Function Tests: Recent Labs  Lab 08/25/17 1520  AST 27  ALT 33   ALKPHOS 78  BILITOT 0.6  PROT 7.4  ALBUMIN 4.1   Recent Labs  Lab 08/25/17 1520  LIPASE 47   No results for input(s): AMMONIA in the last 168 hours. Coagulation Profile: No results for input(s): INR, PROTIME in the last 168 hours. Cardiac Enzymes: No results for input(s): CKTOTAL, CKMB, CKMBINDEX, TROPONINI in the last 168 hours. BNP (last 3 results) No results for input(s): PROBNP in the last 8760 hours. HbA1C: No results for input(s): HGBA1C in the last 72 hours. CBG: Recent Labs  Lab 08/26/17 0937 08/27/17 0748  GLUCAP 87 84   Lipid Profile: No results for input(s): CHOL, HDL, LDLCALC, TRIG, CHOLHDL, LDLDIRECT in the last 72 hours. Thyroid Function Tests: No results for input(s): TSH, T4TOTAL, FREET4, T3FREE, THYROIDAB in the last 72 hours. Anemia Panel: No results for input(s): VITAMINB12, FOLATE, FERRITIN, TIBC, IRON, RETICCTPCT in the last 72 hours. Urine analysis:    Component Value Date/Time   COLORURINE YELLOW 08/25/2017 1245   APPEARANCEUR CLEAR 08/25/2017 1245   LABSPEC 1.009 08/25/2017 1245   PHURINE 5.0 08/25/2017 1245   GLUCOSEU NEGATIVE 08/25/2017 1245   HGBUR NEGATIVE 08/25/2017 1245   [place] 08/25/2017 1245   [place] 08/25/2017 1245   PROTEINUR NEGATIVE 08/25/2017 1245   UROBILINOGEN 0.2 12/14/2012 0816   NITRITE NEGATIVE 08/25/2017 1245   LEUKOCYTESUR SMALL (A) 08/25/2017 1245    Recent Results (from the past 240 hour(s))  MRSA PCR Screening     Status: Abnormal   Collection Time: 08/26/17  4:27 PM  Result Value Ref Range Status   MRSA by PCR POSITIVE (A) NEGATIVE Final    Comment:        The GeneXpert MRSA Assay (FDA approved for NASAL specimens only), is one component of a comprehensive MRSA colonization surveillance program. It is not intended to diagnose MRSA infection nor to guide or monitor treatment for MRSA infections. RESULT CALLED TO, READ BACK BY AND VERIFIED WITHAngela Burke RN [id number] 08/26/17 A  BROWNING Performed at [facility], [place] [address] [place], [place] [address]       Anti-infectives (From admission, onward)   None       Radiology Studies: Ct Head Wo Contrast  Result Date: 08/26/2017 CLINICAL DATA:  Syncopal episode last evening. History of seizure and migraine headache. EXAM: CT HEAD WITHOUT CONTRAST TECHNIQUE: Contiguous axial images were obtained from the base of the skull through the vertex without intravenous contrast. COMPARISON:  None. FINDINGS: BRAIN: The ventricles and sulci are normal. No intraparenchymal hemorrhage, mass effect nor midline shift. No acute large vascular territory infarcts. Grey-white matter distinction is maintained. The basal ganglia are unremarkable. No abnormal extra-axial fluid collections. Basal cisterns are not effaced and midline. The brainstem and cerebellar hemispheres are without acute abnormalities. VASCULAR: Unremarkable. SKULL/SOFT TISSUES:  No skull fracture. No significant soft tissue swelling. ORBITS/SINUSES: The included ocular globes and orbital contents are normal.The mastoid air cells are clear. The included paranasal sinuses are well-aerated. OTHER: Metallic artifacts from bilateral cochlear implants. IMPRESSION: No acute intracranial abnormality.  Bilateral cochlear implants. Electronically Signed   By: Ashley Royalty M.D.   On: 08/26/2017 03:52   Dg Knee Complete 4 Views Left  Result Date: 08/26/2017 CLINICAL DATA:  42 year old female with fall and left knee pain. EXAM: LEFT KNEE - COMPLETE 4+ VIEW COMPARISON:  None. FINDINGS: There is no acute fracture or dislocation. The bones are well mineralized. No arthritic changes. An area of sclerotic changes in the distal femoral diaphysis is not well characterized but appears nonaggressive. Direct comparison with prior images, if available, recommended. There is no joint effusion. The soft tissues appear unremarkable. IMPRESSION: Negative. Electronically Signed   By: Anner Crete  M.D.   On: 08/26/2017 03:19        Scheduled Meds: . aspirin EC  81 mg Oral Daily  . Chlorhexidine Gluconate Cloth  6 each Topical Q0600  . feeding supplement (ENSURE ENLIVE)  237 mL Oral TID AC & HS  . heparin  5,000 Units Subcutaneous Q8H  . lamoTRIgine  200 mg Oral Daily  . levothyroxine  150 mcg Oral QAC breakfast  . lurasidone  80 mg Oral Q breakfast  . Melatonin  6 mg Oral QHS  . metoprolol succinate  50 mg Oral Daily  . multivitamin with minerals  1 tablet Oral Daily  . mupirocin ointment  1 application Nasal BID  . pregabalin  50 mg Oral TID  . QUEtiapine  100 mg Oral QHS  . QUEtiapine  25 mg Oral QHS  . topiramate  75 mg Oral BID  . traZODone  50 mg Oral QHS  . venlafaxine XR  225 mg Oral Q breakfast   Continuous Infusions: . 0.9 % NaCl with KCl 20 mEq / L 50 mL/hr at 08/27/17 1248     LOS: 0 days    Time spent: 25 min    Geradine Girt, DO [facility] Pager [phone]  If 7PM-7AM, please contact night-coverage [email or web address] Password [facility] 08/27/2017, 2:44 PM

## 2017-08-27 NOTE — Care Management Note (Addendum)
Case Management Note  Patient Details  Name: Brandi Love MRN: [id number] Date of Birth: 04/28/1975  Subjective/Objective:     History of borderline personality disorder, OCD, hypothyroidism, deafness with cochlear implants, and chronic kidney disease stage III.  Admitted  For acute on chronic renal failure.             Action/Plan: Prior to admission patient lived at home.  Clinical social worker consulted.  NCM will continue to follow how patient progresses.  Prior to admission patient active with [facility]; In to discuss PT recommendation for [facility] PT.  Patient offered choices, selected [facility].  Expected Discharge Date:   08/28/2017 Expected Discharge Plan:    Home with Home Healt  In-House Referral:  Clinical Social Work  Discharge planning Services  CM Consult  Post Acute Care Choice:   Home Health Choice offered to:   resumptions of orders, RN; [facility] PT also discussed.  DME Arranged:   N/A DME Agency:   N/A  HH Arranged:   PT, RN Vazquez Agency:   [facility]  Status of Service:  Completed, signed off Kristen Cardinal, RN 08/27/2017, 1:07 PM

## 2017-08-28 LAB — CBC
HCT: 30.7 % — ABNORMAL LOW (ref 36.0–46.0)
HEMOGLOBIN: 10.2 g/dL — AB (ref 12.0–15.0)
MCH: 31 pg (ref 26.0–34.0)
MCHC: 33.2 g/dL (ref 30.0–36.0)
MCV: 93.3 fL (ref 78.0–100.0)
PLATELETS: 203 10*3/uL (ref 150–400)
RBC: 3.29 MIL/uL — AB (ref 3.87–5.11)
RDW: 12.8 % (ref 11.5–15.5)
WBC: 5.2 10*3/uL (ref 4.0–10.5)

## 2017-08-28 LAB — GLUCOSE, CAPILLARY: Glucose-Capillary: 92 mg/dL (ref 65–99)

## 2017-08-28 LAB — BASIC METABOLIC PANEL
Anion gap: 7 (ref 5–15)
BUN: 25 mg/dL — AB (ref 6–20)
CHLORIDE: 116 mmol/L — AB (ref 101–111)
CO2: 20 mmol/L — ABNORMAL LOW (ref 22–32)
Calcium: 9.3 mg/dL (ref 8.9–10.3)
Creatinine, Ser: 1.67 mg/dL — ABNORMAL HIGH (ref 0.44–1.00)
GFR calc Af Amer: 44 mL/min — ABNORMAL LOW (ref >60)
GFR calc non Af Amer: 38 mL/min — ABNORMAL LOW (ref >60)
GLUCOSE: 102 mg/dL — AB (ref 65–99)
Potassium: 4 mmol/L (ref 3.5–5.1)
Sodium: 143 mmol/L (ref 135–145)

## 2017-08-28 NOTE — Progress Notes (Signed)
PROGRESS NOTE    Brandi Love  TIW:[id number] DOB: [birth year] DOA: 08/26/2017 PCP: Vickii Penna, MD   Outpatient Specialists:     Brief Narrative:  Brandi Love is a 42 y.o. female with medical history significant for depression with psychosis, borderline personality disorder, OCD, hypothyroidism, deafness with cochlear implants, and chronic kidney disease stage III, now presenting to the emergency department after syncopal episode on 08/24/2017.  Patient reports that she has not been eating or drinking except for sips of water with laxatives.  She reports taking excessive laxatives over the past several days and acknowledges a history of eating disorder and laxative abuse.  She did not have any chest pain or palpitations prior to the syncopal episode.  She reports hitting her head but not losing consciousness and has had a mild headache and knee pain.  Denies suicidal or homicidal ideation, and denies active hallucinations.  Creatinine is trending down with hydration     Assessment & Plan:   Principal Problem:   Acute on chronic renal failure (HCC) Active Problems:   Hypothyroidism   MDD (major depressive disorder), recurrent, severe, with psychosis ([place])   OCD (obsessive compulsive disorder)   Anorexia nervosa with bulimia   CKD (chronic kidney disease), stage III ([place])   Syncope   Acute renal failure superimposed on stage 3 chronic kidney disease ([place])   Acute kidney injury superimposed on CKD stage III  - Presents after a syncopal episode on standing that occurred 08/24/17 and is found to have SCr of 2.20, up from apparent baseline of ~1.5 , creatinine down to 1.7, continue to hydrate, continue to avoid nephrotoxic agent suspect  prerenal azotemia given report of not eating or drinking for several days while abusing laxatives   H/o Depression with psychosis and borderline personality  - Denies SI, HI, or active hallucinations, c/n  home meds - Social work consultation  requested given behavioral health issues leading to hospitalization    Hypothyroidism -- Continue Synthroid   Orthostatic Syncope - - Reports a syncopal episode upon standing on 08/24/17  - Likely orthostatic in setting of laxative abuse and anorexia  , hemodynamics are improving, creatinine is improving, continue to hydrate, check orthostatic vitals on 08/29/2017, if normal may discharge home     DVT prophylaxis:  SQ Heparin  Code Status: Full Code   Family Communication:   Disposition Plan:     Consultants:      Subjective: No vomiting no diarrhea, no discomfort is better  Objective: Vitals:   08/27/17 2105 08/28/17 0523 08/28/17 0909 08/28/17 1638  BP: (!) 114/92 114/84 105/76 (!) 136/93  Pulse: 91 88 73 70  Resp: 16 16 16 16   Temp: 98 F (36.7 C) 98.5 F (36.9 C) 98.2 F (36.8 C) 98.5 F (36.9 C)  TempSrc: Oral Oral  Oral  SpO2: 98% 97% 97% 99%  Weight: 74.6 kg (164 lb 7.4 oz)       Intake/Output Summary (Last 24 hours) at 08/28/2017 2031 Last data filed at 08/28/2017 1500 Gross per 24 hour  Intake 1546.67 ml  Output 0 ml  Net 1546.67 ml   Filed Weights   08/26/17 2103 08/27/17 2105  Weight: 74.6 kg (164 lb 7.4 oz) 74.6 kg (164 lb 7.4 oz)    Examination:  General exam: NAD, in bed, HEENT-  hard of hearing even with cochlear implants on-- reads lips Respiratory system: Clear , no increase work of breathing Cardiovascular system: rrr Gastrointestinal system: +Bs, soft Central nervous system:  Alert and oriented. Skin-warm and dry    Data Reviewed: I have personally reviewed following labs and imaging studies  CBC: Recent Labs  Lab 08/25/17 1520 08/27/17 0500 08/28/17 0356  WBC 10.1 5.7 5.2  HGB 14.3 11.5* 10.2*  HCT 41.7 34.2* 30.7*  MCV 92.3 92.2 93.3  PLT 317 229 188   Basic Metabolic Panel: Recent Labs  Lab 08/25/17 1520 08/26/17 0616 08/27/17 0500 08/28/17 0356  NA 137 138 142 143  K 4.2 3.8 3.3* 4.0  CL 105 110 114* 116*    CO2 18* 15* 19* 20*  GLUCOSE 96 91 86 102*  BUN 30* 32* 28* 25*  CREATININE 2.22* 2.03* 1.71* 1.67*  CALCIUM 10.3 9.8 9.2 9.3  MG  --  2.3  --   --   PHOS  --  5.4*  --   --    GFR: Estimated Creatinine Clearance: 45.6 mL/min (A) (by C-G formula based on SCr of 1.67 mg/dL (H)). Liver Function Tests: Recent Labs  Lab 08/25/17 1520  AST 27  ALT 33  ALKPHOS 78  BILITOT 0.6  PROT 7.4  ALBUMIN 4.1   Recent Labs  Lab 08/25/17 1520  LIPASE 47   No results for input(s): AMMONIA in the last 168 hours. Coagulation Profile: No results for input(s): INR, PROTIME in the last 168 hours. Cardiac Enzymes: No results for input(s): CKTOTAL, CKMB, CKMBINDEX, TROPONINI in the last 168 hours. BNP (last 3 results) No results for input(s): PROBNP in the last 8760 hours. HbA1C: No results for input(s): HGBA1C in the last 72 hours. CBG: Recent Labs  Lab 08/26/17 0937 08/27/17 0748 08/28/17 0742  GLUCAP 87 84 92   Lipid Profile: No results for input(s): CHOL, HDL, LDLCALC, TRIG, CHOLHDL, LDLDIRECT in the last 72 hours. Thyroid Function Tests: No results for input(s): TSH, T4TOTAL, FREET4, T3FREE, THYROIDAB in the last 72 hours. Anemia Panel: No results for input(s): VITAMINB12, FOLATE, FERRITIN, TIBC, IRON, RETICCTPCT in the last 72 hours. Urine analysis:    Component Value Date/Time   COLORURINE YELLOW 08/25/2017 1245   APPEARANCEUR CLEAR 08/25/2017 1245   LABSPEC 1.009 08/25/2017 1245   PHURINE 5.0 08/25/2017 1245   GLUCOSEU NEGATIVE 08/25/2017 1245   HGBUR NEGATIVE 08/25/2017 1245   [place] 08/25/2017 1245   [place] 08/25/2017 1245   PROTEINUR NEGATIVE 08/25/2017 1245   UROBILINOGEN 0.2 12/14/2012 0816   NITRITE NEGATIVE 08/25/2017 1245   LEUKOCYTESUR SMALL (A) 08/25/2017 1245    Recent Results (from the past 240 hour(s))  MRSA PCR Screening     Status: Abnormal   Collection Time: 08/26/17  4:27 PM  Result Value Ref Range Status   MRSA by PCR  POSITIVE (A) NEGATIVE Final    Comment:        The GeneXpert MRSA Assay (FDA approved for NASAL specimens only), is one component of a comprehensive MRSA colonization surveillance program. It is not intended to diagnose MRSA infection nor to guide or monitor treatment for MRSA infections. RESULT CALLED TO, READ BACK BY AND VERIFIED WITHAngela Burke RN [id number] 08/26/17 A BROWNING Performed at [facility], [place] [address], [place], [place] [address]       Anti-infectives (From admission, onward)   None       Radiology Studies: No results found.      Scheduled Meds: . aspirin EC  81 mg Oral Daily  . Chlorhexidine Gluconate Cloth  6 each Topical Q0600  . feeding supplement (ENSURE ENLIVE)  237 mL Oral  TID AC & HS  . heparin  5,000 Units Subcutaneous Q8H  . lamoTRIgine  200 mg Oral Daily  . levothyroxine  150 mcg Oral QAC breakfast  . lurasidone  80 mg Oral Q breakfast  . Melatonin  6 mg Oral QHS  . metoprolol succinate  50 mg Oral Daily  . multivitamin with minerals  1 tablet Oral Daily  . mupirocin ointment  1 application Nasal BID  . pregabalin  50 mg Oral TID  . QUEtiapine  100 mg Oral QHS  . QUEtiapine  25 mg Oral QHS  . topiramate  75 mg Oral BID  . traZODone  50 mg Oral QHS  . venlafaxine XR  225 mg Oral Q breakfast   Continuous Infusions: . 0.9 % NaCl with KCl 20 mEq / L 50 mL/hr at 08/28/17 0549     LOS: 1 day    Time spent: 25 min    [place], DO [facility] Pager [phone]  If 7PM-7AM, please contact night-coverage [email or web address] Password [facility] 08/28/2017, 8:31 PM

## 2017-08-29 LAB — BASIC METABOLIC PANEL
Anion gap: 11 (ref 5–15)
BUN: 22 mg/dL — AB (ref 6–20)
CO2: 16 mmol/L — ABNORMAL LOW (ref 22–32)
Calcium: 9.5 mg/dL (ref 8.9–10.3)
Chloride: 114 mmol/L — ABNORMAL HIGH (ref 101–111)
Creatinine, Ser: 1.5 mg/dL — ABNORMAL HIGH (ref 0.44–1.00)
GFR, EST AFRICAN AMERICAN: 50 mL/min — AB (ref >60)
GFR, EST NON AFRICAN AMERICAN: 43 mL/min — AB (ref >60)
Glucose, Bld: 116 mg/dL — ABNORMAL HIGH (ref 65–99)
POTASSIUM: 4 mmol/L (ref 3.5–5.1)
SODIUM: 141 mmol/L (ref 135–145)

## 2017-08-29 LAB — GLUCOSE, CAPILLARY: GLUCOSE-CAPILLARY: 112 mg/dL — AB (ref 65–99)

## 2017-08-29 MED ORDER — MUPIROCIN 2 % EX OINT: 1.0000 "application " | TOPICAL_OINTMENT | Freq: Two times a day (BID) | CUTANEOUS | 0 refills | Status: DC

## 2017-08-29 NOTE — Discharge Summary (Signed)
Physician Discharge Summary  Brandi Love NOM:[id number] DOB: [birth year] DOA: 08/26/2017  PCP: Vickii Penna, MD  Admit date: 08/26/2017 Discharge date: 08/29/2017  Admitted From:HOME Disposition: Home Recommendations for Outpatient Follow-up:  1. Follow up with PCP in 1-2 weeks 2. Please obtain BMP/CBC in one week  Home [facility]: [facility] Equipment/Devices: None Discharge Condition: Stable CODE STATUS full code Diet recommendation: CarB modified Brief/Interim Summary: 42 y.o. female with medical history significant for depression with psychosis, borderline personality disorder, OCD, hypothyroidism, deafness with cochlear implants, and chronic kidney disease stage III, now presenting to the emergency department after syncopal episode on 08/24/2017.  Patient reports that she has not been eating or drinking except for sips of water with laxatives.  She reports taking excessive laxatives over the past several days and acknowledges a history of eating disorder and laxative abuse.  She did not have any chest pain or palpitations prior to the syncopal episode.  She reports hitting her head but not losing consciousness and has had a mild headache and knee pain.  Denies suicidal or homicidal ideation, and denies active hallucinations.  ED Course: Upon arrival to the ED, patient is found to be afebrile, saturating well on room air, tachycardic to 120, and with stable blood pressure.  Chemistry panel is notable for a creatinine of 2.22, up from an apparent baseline of 1.50.  CBC is unremarkable.  CT head is negative for acute intracranial abnormality and radiographs of the knee are negative for acute fracture or dislocation.  Patient was started on IV fluid hydration in the ED with resolution of her tachycardia.  She will be admitted for ongoing evaluation and management.    Discharge Diagnoses:  Principal Problem:   Acute on chronic renal failure (HCC) Active Problems:   Hypothyroidism    MDD (major depressive disorder), recurrent, severe, with psychosis ([place])   OCD (obsessive compulsive disorder)   Anorexia nervosa with bulimia   CKD (chronic kidney disease), stage III ([place])   Syncope   Acute renal failure superimposed on stage 3 chronic kidney disease ([place])  Acute kidney injury superimposed on CKD stage III -Presents after a syncopal episode on standing that occurred 08/24/17 and is found to have SCr of 2.20, up from apparent baseline of ~1.5, creatinine down to 1.5 with hydration.  We will plan to discharge her home today she will follow-up with her PCP. Patient has [facility] which will be continued upon discharge for PT and RN. H/o Depression with psychosisand borderline personality -Denies SI, HI, or active hallucinations, c/n home meds  Hypothyroidism--Continue Synthroid  Discharge Instructions  Discharge Instructions    Call MD for:  difficulty breathing, headache or visual disturbances   Complete by:  As directed    Call MD for:  persistant dizziness or light-headedness   Complete by:  As directed    Call MD for:  persistant nausea and vomiting   Complete by:  As directed    Diet - low sodium heart healthy   Complete by:  As directed    Face-to-face encounter (required for Medicare/Medicaid patients)   Complete by:  As directed    I Georgette Shell certify that this patient is under my care and that I, or a nurse practitioner or physician's assistant working with me, had a face-to-face encounter that meets the physician face-to-face encounter requirements with this patient on 08/29/2017. The encounter with the patient was in whole, or in part for the following medical condition(s) which is the  primary reason for home health care (List medical condition): ARF   The encounter with the patient was in whole, or in part, for the following medical condition, which is the primary reason for home health care:  renal failure   I certify that, based  on my findings, the following services are medically necessary home health services:   Physical therapy Nursing     Reason for Medically Necessary Home Health Services:  Therapy- Personnel officer, Public librarian   My clinical findings support the need for the above services:  Unable to leave home safely without assistance and/or assistive device   Further, I certify that my clinical findings support that this patient is homebound due to:  Unsafe ambulation due to balance issues   Home Health   Complete by:  As directed    To provide the following care/treatments:   RN PT     Increase activity slowly   Complete by:  As directed      Allergies as of 08/29/2017      Reactions   Iodinated Diagnostic Agents Anaphylaxis   'cant breathe'   Melatonin Itching   Phenylephrine-guaifenesin Shortness Of Breath, Swelling   Throat swelling   Propoxyphene Anaphylaxis   Abilify [aripiprazole] Nausea And Vomiting   Dhea [nutritional Supplements] Nausea And Vomiting, Swelling   Dihydroergotamine Nausea And Vomiting   Divalproex Sodium Nausea And Vomiting, Other (See Comments)   Weight gain   Doxycycline Nausea And Vomiting   Erythromycin Nausea And Vomiting   Indomethacin Nausea And Vomiting   Lactose Intolerance (gi) Nausea And Vomiting   Melatonin Itching   Risperidone And Related Other (See Comments)   Out of body feeling   Shellfish Allergy Itching, Swelling   Eyes swell   Statins Other (See Comments)   Cannot tolerate   Tape Other (See Comments)   Redness/ please use paper tape   Dihydroergotamine Nausea And Vomiting   Lactose Intolerance (gi) Nausea And Vomiting   Nsaids Other (See Comments)   Unable to ttake due to renal insufficency   Tape Other (See Comments)   Redness, can use paper tape      Medication List    STOP taking these medications   aspirin EC 81 MG tablet   magnesium oxide 400 MG tablet Commonly known as:  MAG-OX   Melatonin 3 MG Tabs    omeprazole 20 MG capsule Commonly known as:  PRILOSEC     TAKE these medications   acetaminophen 500 MG tablet Commonly known as:  TYLENOL Take 1,000-3,000 mg by mouth See admin instructions. Take 2 tablets in the morning, at lunch and take 6 tablets at bedtime   fenofibrate 48 MG tablet Commonly known as:  TRICOR Take 48 mg by mouth at bedtime.   fluticasone 50 MCG/ACT nasal spray Commonly known as:  FLONASE Place 2 sprays into both nostrils daily.   gemfibrozil 600 MG tablet Commonly known as:  LOPID Take 300-600 mg by mouth See admin instructions. Taking one tablet (600mg ) in the AM and 1/2 Tab (300mg ) in the evening.   hydrOXYzine 50 MG tablet Commonly known as:  ATARAX/VISTARIL Take 50 mg by mouth 3 (three) times daily as needed for itching.   lamoTRIgine 200 MG tablet Commonly known as:  LAMICTAL Take 200 mg by mouth daily.   levothyroxine 150 MCG tablet Commonly known as:  SYNTHROID, LEVOTHROID Take 150 mcg by mouth daily.   lurasidone 80 MG Tabs tablet Commonly known as:  LATUDA Take  80 mg by mouth daily with breakfast.   metoprolol succinate 50 MG 24 hr tablet Commonly known as:  TOPROL-XL Take 50 mg by mouth daily. Take with or immediately following a meal.   multivitamin with minerals Tabs tablet Take 1 tablet by mouth daily.   mupirocin ointment 2 % Commonly known as:  BACTROBAN Place 1 application into the nose 2 (two) times daily.   omega-3 acid ethyl esters 1 g capsule Commonly known as:  LOVAZA Take 2 g by mouth 2 (two) times daily.   pantoprazole 40 MG tablet Commonly known as:  PROTONIX Take 40 mg by mouth daily.   phenazopyridine 100 MG tablet Commonly known as:  PYRIDIUM Take 100 mg by mouth 3 (three) times daily as needed for pain.   pregabalin 50 MG capsule Commonly known as:  LYRICA Take 50 mg by mouth 3 (three) times daily.   QUEtiapine 100 MG tablet Commonly known as:  SEROQUEL Take 100 mg by mouth at bedtime. Take with 1/2  tablet of Seroquel 50 mg to equal 125 mg What changed:  Another medication with the same name was removed. Continue taking this medication, and follow the directions you see here.   topiramate 25 MG tablet Commonly known as:  TOPAMAX Take 75 mg by mouth 2 (two) times daily.   traZODone 100 MG tablet Commonly known as:  DESYREL Take 100 mg by mouth at bedtime.   venlafaxine XR 75 MG 24 hr capsule Commonly known as:  EFFEXOR-XR Take 225 mg by mouth daily with breakfast.   Vitamin D (Ergocalciferol) 50000 units Caps capsule Commonly known as:  DRISDOL Take 50,000 Units by mouth every 7 (seven) days. On Sunday      Follow-up Information    [facility] Follow up.   Specialty:  [place] Why:  They will call you to set up initial visit Contact information: [place] [address] [phone]        Vickii Penna, MD Follow up.   Specialty:  Family Medicine Contact information: [address] [place] [address] [phone]          Allergies  Allergen Reactions  . Iodinated Diagnostic Agents Anaphylaxis    'cant breathe'  . Melatonin Itching  . Phenylephrine-Guaifenesin Shortness Of Breath and Swelling    Throat swelling  . Propoxyphene Anaphylaxis  . Abilify [Aripiprazole] Nausea And Vomiting  . Dhea [Nutritional Supplements] Nausea And Vomiting and Swelling  . Dihydroergotamine Nausea And Vomiting  . Divalproex Sodium Nausea And Vomiting and Other (See Comments)    Weight gain  . Doxycycline Nausea And Vomiting  . Erythromycin Nausea And Vomiting  . Indomethacin Nausea And Vomiting  . Lactose Intolerance (Gi) Nausea And Vomiting  . Melatonin Itching  . Risperidone And Related Other (See Comments)    Out of body feeling  . Shellfish Allergy Itching and Swelling    Eyes swell  . Statins Other (See Comments)    Cannot tolerate  . Tape Other (See Comments)    Redness/ please use paper tape  . Dihydroergotamine  Nausea And Vomiting  . Lactose Intolerance (Gi) Nausea And Vomiting  . Nsaids Other (See Comments)    Unable to ttake due to renal insufficency  . Tape Other (See Comments)    Redness, can use paper tape    Consultations:  NONE   Procedures/Studies: Ct Head Wo Contrast  Result Date: 08/26/2017 CLINICAL DATA:  Syncopal episode last evening. History  of seizure and migraine headache. EXAM: CT HEAD WITHOUT CONTRAST TECHNIQUE: Contiguous axial images were obtained from the base of the skull through the vertex without intravenous contrast. COMPARISON:  None. FINDINGS: BRAIN: The ventricles and sulci are normal. No intraparenchymal hemorrhage, mass effect nor midline shift. No acute large vascular territory infarcts. Grey-white matter distinction is maintained. The basal ganglia are unremarkable. No abnormal extra-axial fluid collections. Basal cisterns are not effaced and midline. The brainstem and cerebellar hemispheres are without acute abnormalities. VASCULAR: Unremarkable. SKULL/SOFT TISSUES: No skull fracture. No significant soft tissue swelling. ORBITS/SINUSES: The included ocular globes and orbital contents are normal.The mastoid air cells are clear. The included paranasal sinuses are well-aerated. OTHER: Metallic artifacts from bilateral cochlear implants. IMPRESSION: No acute intracranial abnormality.  Bilateral cochlear implants. Electronically Signed   By: Ashley Royalty M.D.   On: 08/26/2017 03:52   Ct Head Wo Contrast  Result Date: 08/02/2017 CLINICAL DATA:  Neck trauma, attempted suicide EXAM: CT HEAD WITHOUT CONTRAST CT CERVICAL SPINE WITHOUT CONTRAST TECHNIQUE: Multidetector CT imaging of the head and cervical spine was performed following the standard protocol without intravenous contrast. Multiplanar CT image reconstructions of the cervical spine were also generated. COMPARISON:  None. FINDINGS: CT HEAD FINDINGS Brain: No evidence of acute infarction, hemorrhage, hydrocephalus,  extra-axial collection or mass lesion/mass effect. Vascular: No hyperdense vessel or unexpected calcification. Skull: Normal. Negative for fracture or focal lesion. Sinuses/Orbits: The visualized paranasal sinuses are essentially clear. The mastoid air cells are unopacified. Prior partial right mastoidectomy. Bilateral cochlear implants. Other: None. CT CERVICAL SPINE FINDINGS Alignment: Normal cervical lordosis. Skull base and vertebrae: No acute fracture. No primary bone lesion or focal pathologic process. Soft tissues and spinal canal: No prevertebral fluid or swelling. No visible canal hematoma. Disc levels: Mild degenerative changes at C5-6. Spinal canal is patent. Upper chest: Visualized lung apices are clear. Other: None. IMPRESSION: Normal head CT. No evidence of traumatic injury to the cervical spine. Electronically Signed   By: Julian Hy M.D.   On: 08/02/2017 20:54   Ct Cervical Spine Wo Contrast  Result Date: 08/02/2017 CLINICAL DATA:  Neck trauma, attempted suicide EXAM: CT HEAD WITHOUT CONTRAST CT CERVICAL SPINE WITHOUT CONTRAST TECHNIQUE: Multidetector CT imaging of the head and cervical spine was performed following the standard protocol without intravenous contrast. Multiplanar CT image reconstructions of the cervical spine were also generated. COMPARISON:  None. FINDINGS: CT HEAD FINDINGS Brain: No evidence of acute infarction, hemorrhage, hydrocephalus, extra-axial collection or mass lesion/mass effect. Vascular: No hyperdense vessel or unexpected calcification. Skull: Normal. Negative for fracture or focal lesion. Sinuses/Orbits: The visualized paranasal sinuses are essentially clear. The mastoid air cells are unopacified. Prior partial right mastoidectomy. Bilateral cochlear implants. Other: None. CT CERVICAL SPINE FINDINGS Alignment: Normal cervical lordosis. Skull base and vertebrae: No acute fracture. No primary bone lesion or focal pathologic process. Soft tissues and spinal  canal: No prevertebral fluid or swelling. No visible canal hematoma. Disc levels: Mild degenerative changes at C5-6. Spinal canal is patent. Upper chest: Visualized lung apices are clear. Other: None. IMPRESSION: Normal head CT. No evidence of traumatic injury to the cervical spine. Electronically Signed   By: Julian Hy M.D.   On: 08/02/2017 20:54   Dg Chest Port 1 View  Result Date: 08/02/2017 CLINICAL DATA:  Chest pain after trauma EXAM: PORTABLE CHEST 1 VIEW COMPARISON:  None. FINDINGS: Lungs are clear. Heart size and pulmonary vascularity are normal. No adenopathy. No pneumothorax. No bone lesions. IMPRESSION: No edema or consolidation.  No pneumothorax. Electronically Signed   By: Lowella Grip III M.D.   On: 08/02/2017 19:20   Dg Knee Complete 4 Views Left  Result Date: 08/26/2017 CLINICAL DATA:  42 year old female with fall and left knee pain. EXAM: LEFT KNEE - COMPLETE 4+ VIEW COMPARISON:  None. FINDINGS: There is no acute fracture or dislocation. The bones are well mineralized. No arthritic changes. An area of sclerotic changes in the distal femoral diaphysis is not well characterized but appears nonaggressive. Direct comparison with prior images, if available, recommended. There is no joint effusion. The soft tissues appear unremarkable. IMPRESSION: Negative. Electronically Signed   By: Anner Crete M.D.   On: 08/26/2017 03:19    (Echo, Carotid, EGD, Colonoscopy, ERCP)    Subjective:   Discharge Exam: Vitals:   08/29/17 0507 08/29/17 0923  BP: 112/81 115/77  Pulse: 84 69  Resp: (!) 1 16  Temp: 98.4 F (36.9 C) 98.4 F (36.9 C)  SpO2: 96% 98%   Vitals:   08/28/17 1638 08/28/17 2123 08/29/17 0507 08/29/17 0923  BP: (!) 136/93 135/89 112/81 115/77  Pulse: 70 63 84 69  Resp: 16 16 (!) 1 16  Temp: 98.5 F (36.9 C) 98.4 F (36.9 C) 98.4 F (36.9 C) 98.4 F (36.9 C)  TempSrc: Oral Oral Oral Oral  SpO2: 99% 99% 96% 98%  Weight:        General: Pt is alert,  awake, not in acute distress Cardiovascular: RRR, S1/S2 +, no rubs, no gallops Respiratory: CTA bilaterally, no wheezing, no rhonchi Abdominal: Soft, NT, ND, bowel sounds + Extremities: no edema, no cyanosis    The results of significant diagnostics from this hospitalization (including imaging, microbiology, ancillary and laboratory) are listed below for reference.     Microbiology: Recent Results (from the past 240 hour(s))  MRSA PCR Screening     Status: Abnormal   Collection Time: 08/26/17  4:27 PM  Result Value Ref Range Status   MRSA by PCR POSITIVE (A) NEGATIVE Final    Comment:        The GeneXpert MRSA Assay (FDA approved for NASAL specimens only), is one component of a comprehensive MRSA colonization surveillance program. It is not intended to diagnose MRSA infection nor to guide or monitor treatment for MRSA infections. RESULT CALLED TO, READ BACK BY AND VERIFIED WITHAngela Burke RN [id number] 08/26/17 A BROWNING Performed at [facility], [address], [place], [place] [address]      Labs: BNP (last 3 results) No results for input(s): BNP in the last 8760 hours. Basic Metabolic Panel: Recent Labs  Lab 08/25/17 1520 08/26/17 0616 08/27/17 0500 08/28/17 0356 08/29/17 0848  NA 137 138 142 143 141  K 4.2 3.8 3.3* 4.0 4.0  CL 105 110 114* 116* 114*  CO2 18* 15* 19* 20* 16*  GLUCOSE 96 91 86 102* 116*  BUN 30* 32* 28* 25* 22*  CREATININE 2.22* 2.03* 1.71* 1.67* 1.50*  CALCIUM 10.3 9.8 9.2 9.3 9.5  MG  --  2.3  --   --   --   PHOS  --  5.4*  --   --   --    Liver Function Tests: Recent Labs  Lab 08/25/17 1520  AST 27  ALT 33  ALKPHOS 78  BILITOT 0.6  PROT 7.4  ALBUMIN 4.1   Recent Labs  Lab 08/25/17 1520  LIPASE 47   No results for input(s): AMMONIA in the last 168 hours. CBC: Recent Labs  Lab  08/25/17 1520 08/27/17 0500 08/28/17 0356  WBC 10.1 5.7 5.2  HGB 14.3 11.5* 10.2*  HCT 41.7 34.2* 30.7*  MCV 92.3 92.2 93.3  PLT 317 229 203    Cardiac Enzymes: No results for input(s): CKTOTAL, CKMB, CKMBINDEX, TROPONINI in the last 168 hours. BNP: Invalid input(s): POCBNP CBG: Recent Labs  Lab 08/26/17 0937 08/27/17 0748 08/28/17 0742 08/29/17 0818  GLUCAP 87 84 92 112*   D-Dimer No results for input(s): DDIMER in the last 72 hours. Hgb A1c No results for input(s): HGBA1C in the last 72 hours. Lipid Profile No results for input(s): CHOL, HDL, LDLCALC, TRIG, CHOLHDL, LDLDIRECT in the last 72 hours. Thyroid function studies No results for input(s): TSH, T4TOTAL, T3FREE, THYROIDAB in the last 72 hours.  Invalid input(s): FREET3 Anemia work up No results for input(s): VITAMINB12, FOLATE, FERRITIN, TIBC, IRON, RETICCTPCT in the last 72 hours. Urinalysis    Component Value Date/Time   COLORURINE YELLOW 08/25/2017 1245   APPEARANCEUR CLEAR 08/25/2017 1245   LABSPEC 1.009 08/25/2017 1245   PHURINE 5.0 08/25/2017 1245   GLUCOSEU NEGATIVE 08/25/2017 1245   HGBUR NEGATIVE 08/25/2017 1245   [place] 08/25/2017 1245   [place] 08/25/2017 1245   PROTEINUR NEGATIVE 08/25/2017 1245   UROBILINOGEN 0.2 12/14/2012 0816   NITRITE NEGATIVE 08/25/2017 1245   LEUKOCYTESUR SMALL (A) 08/25/2017 1245   Sepsis Labs Invalid input(s): PROCALCITONIN,  WBC,  LACTICIDVEN Microbiology Recent Results (from the past 240 hour(s))  MRSA PCR Screening     Status: Abnormal   Collection Time: 08/26/17  4:27 PM  Result Value Ref Range Status   MRSA by PCR POSITIVE (A) NEGATIVE Final    Comment:        The GeneXpert MRSA Assay (FDA approved for NASAL specimens only), is one component of a comprehensive MRSA colonization surveillance program. It is not intended to diagnose MRSA infection nor to guide or monitor treatment for MRSA infections. RESULT CALLED TO, READ BACK BY AND VERIFIED WITHAngela Burke RN [phone] 08/26/17 A BROWNING Performed at [facility], [place] [address], [place], [place] [address]       Time coordinating discharge:  35 minutes  SIGNED:   Georgette Shell, MD  [facility] 08/29/2017, 12:56 PM Pager   If 7PM-7AM, please contact night-coverage [email or web address] Password [email or web address]

## 2017-08-29 NOTE — Evaluation (Addendum)
Physical Therapy Evaluation Patient Details Name: Brandi Love MRN: [id number] DOB: December 22, 1975 Today's Date: 08/29/2017   History of Present Illness  Pt. is a 42 y.o. F with significant PMH of depression with psychosis, borderline personality disorder, OCD, hypothyroidism, deafness with cochlear implants and chronic kidney disease stage II, now presenting to ED after syncopal episode on 08/24/2017. Patient reports she has not been eating or drinking except for sips of water with laxatives.   Clinical Impression  Pt admitted with above diagnosis. Pt currently with functional limitations due to the deficits listed below (see PT Problem List). Patient with history of eating disorder and made several comments during session congruent to this diagnosis during session I.e. "I'm too fat," and refused breakfast. Patient at baseline with functional mobility. Reports she lives alone and occasionally uses a RW at home for balance when she feels "weak." Able to ambulate 200 feet with supervision and use of IV pole for support. States she has left shin pain following fall; did note slight effusion and instructed patient on ice/elevation. Patient has no further acute care needs. Recommending HHPT for home safety evaluation, however, patient would likely benefit from further support/care for eating disorder at discharge to prevent hospital readmission (note clinical social work has been consulted).     Follow Up Recommendations Home health PT    Equipment Recommendations  None recommended by PT    Recommendations for Other Services       Precautions / Restrictions Precautions Precautions: Fall Restrictions Weight Bearing Restrictions: No      Mobility  Bed Mobility Overal bed mobility: Independent                Transfers Overall transfer level: Independent                  Ambulation/Gait Ambulation/Gait assistance: Supervision Ambulation Distance (Feet): 200 Feet Assistive  device: (IV pole) Gait Pattern/deviations: Step-through pattern Gait velocity: decreased   General Gait Details: Patient with use of IV pole for additional support; supervision for safety. Left foot supination noted during stance phase.   Stairs            Wheelchair Mobility    Modified Rankin (Stroke Patients Only)       Balance Overall balance assessment: Mild deficits observed, not formally tested                                           Pertinent Vitals/Pain Pain Assessment: Faces Faces Pain Scale: Hurts a little bit Pain Location: L shin (following fall at home) Pain Descriptors / Indicators: Aching;Constant Pain Intervention(s): Limited activity within patient's tolerance;Monitored during session    Home Living Family/patient expects to be discharged to:: Private residence Living Arrangements: Alone Available Help at Discharge: Family;Available PRN/intermittently(lives in [place]) Type of Home: Apartment Home Access: Stairs to enter Entrance Stairs-Rails: Right;Left Entrance Stairs-Number of Steps: 12 Home Layout: One level Home Equipment: Walker - 2 wheels;Cane - single point      Prior Function Level of Independence: Independent with assistive device(s)         Comments: Uses RW intermittently for balance when she feels "weak"     Hand Dominance        Extremity/Trunk Assessment   Upper Extremity Assessment Upper Extremity Assessment: Overall WFL for tasks assessed    Lower Extremity Assessment Lower Extremity Assessment: Overall WFL for tasks assessed  Communication   Communication: Deaf;Other (comment)(reads lips)  Cognition Arousal/Alertness: Awake/alert Behavior During Therapy: WFL for tasks assessed/performed Overall Cognitive Status: Within Functional Limits for tasks assessed                                        General Comments General comments (skin integrity, edema, etc.):  Orthostatics: Supine: 125/80, Sitting: 108/86, Standing: 104/84    Exercises     Assessment/Plan    PT Assessment Patient needs continued PT services  PT Problem List Decreased activity tolerance;Decreased balance;Decreased mobility;Pain       PT Treatment Interventions      PT Goals (Current goals can be found in the Care Plan section)  Acute Rehab PT Goals PT Goal Formulation: All assessment and education complete, DC therapy    Frequency     Barriers to discharge        Co-evaluation               AM-PAC PT "6 Clicks" Daily Activity  Outcome Measure Difficulty turning over in bed (including adjusting bedclothes, sheets and blankets)?: None Difficulty moving from lying on back to sitting on the side of the bed? : None Difficulty sitting down on and standing up from a chair with arms (e.g., wheelchair, bedside commode, etc,.)?: None Help needed moving to and from a bed to chair (including a wheelchair)?: None Help needed walking in hospital room?: A Little Help needed climbing 3-5 steps with a railing? : A Little 6 Click Score: 22    End of Session Equipment Utilized During Treatment: Gait belt Activity Tolerance: Patient tolerated treatment well Patient left: in bed;with call bell/phone within reach Nurse Communication: Mobility status PT Visit Diagnosis: Unsteadiness on feet (R26.81);Difficulty in walking, not elsewhere classified (R26.2)    Time: 6811-5726 PT Time Calculation (min) (ACUTE ONLY): 25 min   Charges:   PT Evaluation $PT Eval Moderate Complexity: 1 Mod PT Treatments $Gait Training: 8-22 mins   PT G Codes:        Ellamae Sia, PT, DPT Acute Rehabilitation Services  Pager: [phone]   Willy Eddy 08/29/2017, 9:01 AM

## 2018-01-28 DIAGNOSIS — R7303 Prediabetes: Secondary | ICD-10-CM | POA: Insufficient documentation

## 2018-01-28 DIAGNOSIS — E119 Type 2 diabetes mellitus without complications: Secondary | ICD-10-CM | POA: Insufficient documentation

## 2018-01-28 DIAGNOSIS — Z794 Long term (current) use of insulin: Secondary | ICD-10-CM | POA: Insufficient documentation

## 2018-02-16 DIAGNOSIS — F5101 Primary insomnia: Secondary | ICD-10-CM | POA: Insufficient documentation

## 2018-02-16 DIAGNOSIS — K219 Gastro-esophageal reflux disease without esophagitis: Secondary | ICD-10-CM | POA: Insufficient documentation

## 2018-05-23 ENCOUNTER — Emergency Department (HOSPITAL_BASED_OUTPATIENT_CLINIC_OR_DEPARTMENT_OTHER): Payer: Medicare Other

## 2018-05-23 ENCOUNTER — Emergency Department (HOSPITAL_BASED_OUTPATIENT_CLINIC_OR_DEPARTMENT_OTHER)
Admission: EM | Admit: 2018-05-23 | Discharge: 2018-05-24 | Disposition: A | Discharge status: Home / Self Care | Payer: Medicare Other | Attending: Emergency Medicine | Admitting: Emergency Medicine

## 2018-05-23 DIAGNOSIS — Z79899 Other long term (current) drug therapy: Secondary | ICD-10-CM | POA: Diagnosis not present

## 2018-05-23 DIAGNOSIS — I129 Hypertensive chronic kidney disease with stage 1 through stage 4 chronic kidney disease, or unspecified chronic kidney disease: Secondary | ICD-10-CM | POA: Diagnosis not present

## 2018-05-23 DIAGNOSIS — M25551 Pain in right hip: Secondary | ICD-10-CM | POA: Diagnosis present

## 2018-05-23 DIAGNOSIS — N183 Chronic kidney disease, stage 3 (moderate): Secondary | ICD-10-CM | POA: Insufficient documentation

## 2018-05-23 DIAGNOSIS — E039 Hypothyroidism, unspecified: Secondary | ICD-10-CM | POA: Diagnosis not present

## 2018-05-23 MED ORDER — MORPHINE SULFATE (PF) 4 MG/ML IV SOLN
6.0000 mg | Freq: Once | INTRAVENOUS | Status: AC
  Administered 2018-05-23: 6 mg via INTRAMUSCULAR
  Filled 2018-05-23: qty 2

## 2018-05-23 MED ORDER — HYDROCODONE-ACETAMINOPHEN 5-325 MG PO TABS
2.0000 | ORAL_TABLET | Freq: Once | ORAL | Status: AC
  Administered 2018-05-23: 2 via ORAL
  Filled 2018-05-23: qty 2

## 2018-05-23 NOTE — Discharge Instructions (Signed)
Gradually increase weightbearing and range of motion as tolerated. Use your normal pain medicines that you take at home.

## 2018-05-23 NOTE — ED Notes (Signed)
Attempted to ambulate pt without success.  Pt reports that she lives on the 2nd floor and will be unable to climb the stairs to get into her apartment.  Attempted to ambulate with a walker without success. Pt will no bear weight on her right leg and states that she is unable to use her arms because they also hurt.  EDP updated and will talk with pt.

## 2018-05-23 NOTE — ED Triage Notes (Signed)
Pt BIB [facility] for right hip pain. Pt states that she heard her right hip pop. Pt c/o pain 10/10 and is unable to bear weight. PMS intact per EMS. Pt deaf, has inplants and can read lips

## 2018-05-23 NOTE — ED Provider Notes (Signed)
[facility] EMERGENCY DEPARTMENT Provider Note   CSN: [id number] Arrival date & time: 05/23/18  1932     History   Chief Complaint Chief Complaint  Patient presents with  . Hip Pain    HPI Brandi Love is a 43 y.o. female.  Patient with history of eating disorder, anemia, pulmonary embolism, deafness, hypothyroidism presents with sudden right hip pain.  Patient was rotating her right hip in and out and had sudden onset loud pop.  Patient denies history of dislocation.  No direct trauma injury.  No other pain or injuries currently.  Patient's had intermittent mild back and joint pains for which she is seeing her primary doctor for.  No fevers.  Per report patient was bearing weight on it prior to arrival.     Past Medical History:  Diagnosis Date  . Anemia   . Anorexia   . Anxiety   . Arthritis    "hands" (09/18/2016)  . Chronic kidney disease   . Chronic lower back pain   . Chronic renal insufficiency   . Deaf   . Deafness   . Depression   . Diabetes ([place])    "borderline"  . Eating disorder   . Endometriosis   . GERD (gastroesophageal reflux disease)   . History of kidney stones   . HTN (hypertension)   . Hyperlipidemia   . Hypertension   . 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 Migraine    "frequency varies" (09/18/2016)  . OCD (obsessive compulsive disorder)   . Pneumonia 2011  . PONV (postoperative nausea and vomiting)   . Pulmonary embolism ([place]) 10/2001  . Seizures ([place]) 09/2001 X 1   "day after my hysterectomy"  . Seizures ([place])   . Tachycardia     Patient Active Problem List   Diagnosis Date Noted  . Syncope 08/26/2017  . Acute renal failure superimposed on stage 3 chronic kidney disease ([place]) 08/26/2017  . Orthostatic syncope   . Urinary tract infection with hematuria   . OCD (obsessive compulsive disorder) 09/29/2016  . Anorexia nervosa with bulimia 09/29/2016  . Deafness congenital 09/29/2016  . Intractable nausea and  vomiting 09/29/2016  . Bloody diarrhea 09/29/2016  . AKI (acute kidney injury) ([place]) 09/29/2016  . Severe dehydration 09/29/2016  . Severe protein-calorie malnutrition ([place]) 09/29/2016  . Congenital hypothyroidism 09/29/2016  . Hypertriglyceridemia 09/29/2016  . Anemia 09/29/2016  . Endometriosis 09/29/2016  . MDD (major depressive disorder) 09/29/2016  . Hyperlipidemia 09/29/2016  . CKD (chronic kidney disease), stage III ([place]) 09/29/2016  . Acute kidney injury ([place]) 09/29/2016  . OAB (overactive bladder) 09/29/2016  . Sepsis ([place]) 09/18/2016  . Acute pyelonephritis 09/18/2016  . MDD (major depressive disorder), recurrent, severe, with psychosis ([place]) 12/11/2014  . Eating disorder, unspecified 11/23/2012  . Hypothyroidism 11/23/2012  . Dyslipidemia 11/23/2012  . Sinus tachycardia 11/23/2012  . CKD (chronic kidney disease) stage 3, GFR 30-59 ml/min ([facility]) 11/18/2012  . Protein-calorie malnutrition, severe ([place]) 11/13/2012  . Abdominal pain, epigastric 11/12/2012  . Dehydration 11/12/2012  . Acute on chronic renal failure ([place]) 11/12/2012  . Leukocytosis, unspecified 11/12/2012  . Hyponatremia 11/12/2012  . Borderline personality disorder ([place]) 11/12/2012    Past Surgical History:  Procedure Laterality Date  . ABDOMINAL HYSTERECTOMY  09/2001   'except for right ovary'  . BREAST LUMPECTOMY Left 2000   benign  . BREAST LUMPECTOMY Right 2002   benign  . COCHLEAR IMPLANT Bilateral 2004-2010   "right-left"  . COLONOSCOPY  06/2015    Dr Rolm Bookbinder at [facility] for rectal bleeding: internal hemorrhoids, small anal fissure.    . ESOPHAGOGASTRODUODENOSCOPY N/A 10/07/2016   Procedure: ESOPHAGOGASTRODUODENOSCOPY (EGD);  Surgeon: Ladene Artist, MD;  Location: [facility] ENDOSCOPY;  Service: Endoscopy;  Laterality: N/A;  . EYE MUSCLE SURGERY Left 1980   "lazy eye"  . EYE MUSCLE SURGERY Right 1990   "lazy eye"  . LAPAROSCOPIC ABDOMINAL EXPLORATION  2001 and 2002   for endometriosis      OB History   No obstetric history on file.      Home Medications    Prior to Admission medications   Medication Sig Start Date End Date Taking? Authorizing Provider  acetaminophen (TYLENOL) 500 MG tablet Take 1,000-3,000 mg by mouth See admin instructions. Take 2 tablets in the morning, at lunch and take 6 tablets at bedtime    [provider]  fenofibrate (TRICOR) 48 MG tablet Take 48 mg by mouth at bedtime.    [provider]  fluticasone (FLONASE) 50 MCG/ACT nasal spray Place 2 sprays into both nostrils daily.  05/03/17   [provider]  gemfibrozil (LOPID) 600 MG tablet Take 300-600 mg by mouth See admin instructions. Taking one tablet (600mg ) in the AM and 1/2 Tab (300mg ) in the evening. 07/17/17   [provider]  hydrOXYzine (ATARAX/VISTARIL) 50 MG tablet Take 50 mg by mouth 3 (three) times daily as needed for itching.     [provider]  lamoTRIgine (LAMICTAL) 200 MG tablet Take 200 mg by mouth daily.    [provider]  levothyroxine (SYNTHROID, LEVOTHROID) 150 MCG tablet Take 150 mcg by mouth daily. 07/17/17   [provider]  lurasidone (LATUDA) 80 MG TABS tablet Take 80 mg by mouth daily with breakfast.    [provider]  metoprolol succinate (TOPROL-XL) 50 MG 24 hr tablet Take 50 mg by mouth daily. Take with or immediately following a meal.    [provider]  Multiple Vitamin (MULTIVITAMIN WITH MINERALS) TABS Take 1 tablet by mouth daily.    [provider]  mupirocin ointment (BACTROBAN) 2 % Place 1 application into the nose 2 (two) times daily. 08/29/17   Georgette Shell, MD  omega-3 acid ethyl esters (LOVAZA) 1 g capsule Take 2 g by mouth 2 (two) times daily.    [provider]  pantoprazole (PROTONIX) 40 MG tablet Take 40 mg by mouth daily.    [provider]  phenazopyridine (PYRIDIUM) 100 MG tablet Take 100 mg by mouth 3 (three) times daily as needed for  pain.    [provider]  pregabalin (LYRICA) 50 MG capsule Take 50 mg by mouth 3 (three) times daily.    [provider]  QUEtiapine (SEROQUEL) 100 MG tablet Take 100 mg by mouth at bedtime. Take with 1/2 tablet of Seroquel 50 mg to equal 125 mg    [provider]  topiramate (TOPAMAX) 25 MG tablet Take 75 mg by mouth 2 (two) times daily.    [provider]  traZODone (DESYREL) 100 MG tablet Take 100 mg by mouth at bedtime.    [provider]  venlafaxine XR (EFFEXOR-XR) 75 MG 24 hr capsule Take 225 mg by mouth daily with breakfast.    [provider]  Vitamin D, Ergocalciferol, (DRISDOL) 50000 UNITS CAPS Take 50,000 Units by mouth every 7 (seven) days. On Sunday    [provider]    Family History Family History  Problem Relation  Age of Onset  . Cancer Father   . Hyperlipidemia Father   . Osteoarthritis Mother   . Hyperlipidemia Mother     Social History Social History   Tobacco Use  . Smoking status: Never Smoker  . Smokeless tobacco: Never Used  Substance Use Topics  . Alcohol use: Never    Frequency: Never  . Drug use: Never     Allergies   Iodinated diagnostic agents; Melatonin; Phenylephrine-guaifenesin; Propoxyphene; Abilify [aripiprazole]; Dhea [nutritional supplements]; Dihydroergotamine; Divalproex sodium; Doxycycline; Erythromycin; Indomethacin; Lactose intolerance (gi); Melatonin; Risperidone and related; Shellfish allergy; Statins; Tape; Dihydroergotamine; Lactose intolerance (gi); Nsaids; and Tape   Review of Systems Review of Systems  Constitutional: Negative for chills and fever.  HENT: Negative for congestion.   Eyes: Negative for visual disturbance.  Respiratory: Negative for shortness of breath.   Cardiovascular: Negative for chest pain.  Gastrointestinal: Negative for abdominal pain and vomiting.  Genitourinary: Negative for dysuria and flank pain.  Musculoskeletal: Positive for  arthralgias and gait problem. Negative for back pain, neck pain and neck stiffness.  Skin: Negative for rash.  Neurological: Negative for light-headedness and headaches.     Physical Exam Updated Vital Signs BP (!) 144/101   Pulse (!) 112   Temp 98.4 F (36.9 C)   Resp 20   LMP  (LMP Unknown)   SpO2 97%   Physical Exam Vitals signs and nursing note reviewed.  Constitutional:      Appearance: She is well-developed.  HENT:     Head: Normocephalic and atraumatic.  Eyes:     General:        Right eye: No discharge.        Left eye: No discharge.  Neck:     Musculoskeletal: Normal range of motion.     Trachea: No tracheal deviation.  Cardiovascular:     Rate and Rhythm: Normal rate.  Pulmonary:     Effort: Pulmonary effort is normal.  Abdominal:     General: There is no distension.     Tenderness: There is no abdominal tenderness. There is no guarding.  Musculoskeletal:     Comments: Patient has tenderness with external and internal range of motion of the right hip.  Mild tenderness to palpation of lateral hip.  No effusion palpated.  No other tenderness distal right leg.  No tenderness to movement of the left hip.  Skin:    General: Skin is warm.  Neurological:     Mental Status: She is alert and oriented to person, place, and time.      ED Treatments / Results  Labs (all labs ordered are listed, but only abnormal results are displayed) Labs Reviewed - No data to display  EKG None  Radiology Ct Hip Right Wo Contrast  Result Date: 05/23/2018 CLINICAL DATA:  Right hip pain after popping joints today. EXAM: CT OF THE RIGHT HIP WITHOUT CONTRAST TECHNIQUE: Multidetector CT imaging of the right hip was performed according to the standard protocol. Multiplanar CT image reconstructions were also generated. COMPARISON:  None. FINDINGS: Bones/Joint/Cartilage No acute fracture of the hips and proximal femora. Mild facet arthropathy at L5-S1. No pelvic diastasis or fracture.  No suspicious osseous lesions. Ligaments Suboptimally assessed by CT. Muscles and Tendons Negative Soft tissues Status post hysterectomy. Physiologic sized follicles of right ovary. No acute bowel obstruction or inflammation. The urinary bladder is unremarkable for the degree of distention. No adenopathy is seen. IMPRESSION: No acute fracture or dislocation of the hips or proximal femora. Electronically Signed  By: Ashley Royalty M.D.   On: 05/23/2018 21:28   Dg Hip Unilat  With Pelvis 2-3 Views Right  Result Date: 05/23/2018 CLINICAL DATA:  Hip pain EXAM: DG HIP (WITH OR WITHOUT PELVIS) 2-3V RIGHT COMPARISON:  None. FINDINGS: SI joints are non widened. Phleboliths in the pelvis. Pubic symphysis and rami appear intact. No fracture or malalignment. The joint space is maintained. IMPRESSION: No acute osseous and number Electronically Signed   By: Donavan Foil M.D.   On: 05/23/2018 20:23    Procedures Procedures (including critical care time)  Medications Ordered in ED Medications  morphine 4 MG/ML injection 6 mg (has no administration in time range)  HYDROcodone-acetaminophen (NORCO/VICODIN) 5-325 MG per tablet 2 tablet (2 tablets Oral Given 05/23/18 2057)     Initial Impression / Assessment and Plan / ED Course  I have reviewed the triage vital signs and the nursing notes.  Pertinent labs & imaging results that were available during my care of the patient were reviewed by me and considered in my medical decision making (see chart for details).    Patient presents with acute right hip pain clinically concern for subluxation.  Patient has equal leg lengths, no direct trauma history.  X-ray reviewed no acute fracture or dislocation.  With significant persistent pain and pain plan for CT scan to look for any occult fracture.  Patient will need close follow-up with sports medicine/family doc/orthopedics early this week. CT scan no acute findings.  Patient still having pain with ambulating.  Repeat pain  medicines morphine ordered and plan to have patient go home this evening with pain meds and close follow-up early this week with sports medicine/family medicine.  Social work consulted to check on patient on Monday. Results and differential diagnosis were discussed with the patient/parent/guardian. Xrays were independently reviewed by myself.  Close follow up outpatient was discussed, comfortable with the plan.   Medications  morphine 4 MG/ML injection 6 mg (has no administration in time range)  HYDROcodone-acetaminophen (NORCO/VICODIN) 5-325 MG per tablet 2 tablet (2 tablets Oral Given 05/23/18 2057)    Vitals:   05/23/18 1935  BP: (!) 144/101  Pulse: (!) 112  Resp: 20  Temp: 98.4 F (36.9 C)  SpO2: 97%    Final diagnoses:  Acute right hip pain     Final Clinical Impressions(s) / ED Diagnoses   Final diagnoses:  Acute right hip pain    ED Discharge Orders    None       Elnora Morrison, MD 05/29/18 1907

## 2018-05-24 NOTE — ED Notes (Signed)
Pt discharged with [facility].  Pt unable to travel home with uber or taxi due to having narcotics on board.  No acute distress noted.  Pt ambulatory with walker to [facility] stretcher.

## 2018-05-24 NOTE — ED Notes (Signed)
Pt reports that she has a 'deep internal itching' from the morphine and is requesting benadryl.  EDP denied pts request.  No hives, respiratory distress noted.

## 2018-05-24 NOTE — ED Notes (Signed)
Spoke to MD about crutches. Crutches are discontinued.

## 2018-05-24 NOTE — ED Notes (Signed)
Contacted [facility] for transport

## 2018-06-23 ENCOUNTER — Emergency Department (HOSPITAL_BASED_OUTPATIENT_CLINIC_OR_DEPARTMENT_OTHER): Payer: Medicare Other

## 2018-06-23 ENCOUNTER — Emergency Department (HOSPITAL_BASED_OUTPATIENT_CLINIC_OR_DEPARTMENT_OTHER)
Admission: EM | Admit: 2018-06-23 | Discharge: 2018-06-23 | Disposition: A | Discharge status: Home / Self Care | Payer: Medicare Other | Attending: Emergency Medicine | Admitting: Emergency Medicine

## 2018-06-23 ENCOUNTER — Other Ambulatory Visit: Payer: Self-pay

## 2018-06-23 ENCOUNTER — Encounter (HOSPITAL_BASED_OUTPATIENT_CLINIC_OR_DEPARTMENT_OTHER): Payer: Self-pay

## 2018-06-23 DIAGNOSIS — E860 Dehydration: Secondary | ICD-10-CM

## 2018-06-23 DIAGNOSIS — R630 Anorexia: Secondary | ICD-10-CM | POA: Insufficient documentation

## 2018-06-23 DIAGNOSIS — Z79899 Other long term (current) drug therapy: Secondary | ICD-10-CM | POA: Diagnosis not present

## 2018-06-23 DIAGNOSIS — R109 Unspecified abdominal pain: Secondary | ICD-10-CM | POA: Insufficient documentation

## 2018-06-23 DIAGNOSIS — N183 Chronic kidney disease, stage 3 (moderate): Secondary | ICD-10-CM | POA: Diagnosis not present

## 2018-06-23 DIAGNOSIS — J069 Acute upper respiratory infection, unspecified: Secondary | ICD-10-CM | POA: Insufficient documentation

## 2018-06-23 DIAGNOSIS — I129 Hypertensive chronic kidney disease with stage 1 through stage 4 chronic kidney disease, or unspecified chronic kidney disease: Secondary | ICD-10-CM | POA: Diagnosis not present

## 2018-06-23 DIAGNOSIS — R531 Weakness: Secondary | ICD-10-CM | POA: Diagnosis present

## 2018-06-23 LAB — LIPASE, BLOOD: LIPASE: 160 U/L — AB (ref 11–51)

## 2018-06-23 LAB — COMPREHENSIVE METABOLIC PANEL
ALT: 41 U/L (ref 0–44)
ANION GAP: 14 (ref 5–15)
AST: 32 U/L (ref 15–41)
Albumin: 4.9 g/dL (ref 3.5–5.0)
Alkaline Phosphatase: 85 U/L (ref 38–126)
BUN: 33 mg/dL — ABNORMAL HIGH (ref 6–20)
CO2: 18 mmol/L — ABNORMAL LOW (ref 22–32)
Calcium: 10.8 mg/dL — ABNORMAL HIGH (ref 8.9–10.3)
Chloride: 102 mmol/L (ref 98–111)
Creatinine, Ser: 2.32 mg/dL — ABNORMAL HIGH (ref 0.44–1.00)
GFR calc Af Amer: 30 mL/min — ABNORMAL LOW (ref >60)
GFR calc non Af Amer: 25 mL/min — ABNORMAL LOW (ref >60)
Glucose, Bld: 97 mg/dL (ref 70–99)
Potassium: 4.2 mmol/L (ref 3.5–5.1)
Sodium: 134 mmol/L — ABNORMAL LOW (ref 135–145)
Total Bilirubin: 0.6 mg/dL (ref 0.3–1.2)
Total Protein: 8.1 g/dL (ref 6.5–8.1)

## 2018-06-23 LAB — CBC WITH DIFFERENTIAL/PLATELET
Abs Immature Granulocytes: 0.05 10*3/uL (ref 0.00–0.07)
Basophils Absolute: 0.1 10*3/uL (ref 0.0–0.1)
Basophils Relative: 1 %
Eosinophils Absolute: 0.1 10*3/uL (ref 0.0–0.5)
Eosinophils Relative: 2 %
HCT: 44.1 % (ref 36.0–46.0)
Hemoglobin: 14.8 g/dL (ref 12.0–15.0)
Immature Granulocytes: 1 %
Lymphocytes Relative: 31 %
Lymphs Abs: 2.9 10*3/uL (ref 0.7–4.0)
MCH: 31.5 pg (ref 26.0–34.0)
MCHC: 33.6 g/dL (ref 30.0–36.0)
MCV: 93.8 fL (ref 80.0–100.0)
Monocytes Absolute: 0.7 10*3/uL (ref 0.1–1.0)
Monocytes Relative: 8 %
Neutro Abs: 5.5 10*3/uL (ref 1.7–7.7)
Neutrophils Relative %: 57 %
Platelets: 391 10*3/uL (ref 150–400)
RBC: 4.7 MIL/uL (ref 3.87–5.11)
RDW: 12.2 % (ref 11.5–15.5)
WBC: 9.4 10*3/uL (ref 4.0–10.5)
nRBC: 0 % (ref 0.0–0.2)

## 2018-06-23 LAB — TROPONIN I: Troponin I: <0.03 ng/mL (ref <0.03)

## 2018-06-23 LAB — CBG MONITORING, ED: GLUCOSE-CAPILLARY: 84 mg/dL (ref 70–99)

## 2018-06-23 LAB — ETHANOL: Alcohol, Ethyl (B): <10 mg/dL (ref <10)

## 2018-06-23 MED ORDER — ACETAMINOPHEN 325 MG PO TABS
650.0000 mg | ORAL_TABLET | Freq: Once | ORAL | Status: AC
  Administered 2018-06-23: 650 mg via ORAL
  Filled 2018-06-23: qty 2

## 2018-06-23 MED ORDER — SODIUM CHLORIDE 0.9 % IV BOLUS
1000.0000 mL | Freq: Once | INTRAVENOUS | Status: AC
  Administered 2018-06-23: 1000 mL via INTRAVENOUS

## 2018-06-23 MED ORDER — ONDANSETRON HCL 4 MG/2ML IJ SOLN
4.0000 mg | Freq: Once | INTRAMUSCULAR | Status: AC
  Administered 2018-06-23: 4 mg via INTRAVENOUS
  Filled 2018-06-23: qty 2

## 2018-06-23 NOTE — ED Triage Notes (Signed)
Per [facility] report-pt c/o weakness and no po intake x 3 days-pt called her PCP and was advised to call 911-pt called 911 and her "behavioral therapist" who is en route-pt agrees with report and c/o nausea and "fibromyalgia"-NAD-to triage in w/c

## 2018-06-23 NOTE — ED Provider Notes (Signed)
[facility] DEPARTMENT Provider Note   CSN: [id number] Arrival date & time: 06/23/18  1300    History   Chief Complaint Chief Complaint  Patient presents with  . Weakness    HPI Brandi Love is a 43 y.o. female.     HPI   43 year old female, with a PMH of CKD, fibromyalgia, prediabetic, anorexia nervosa, hypothyroidism, presents with multiple complaints.  Patient states she has not been eating for the last 4 days because she did not feel like eating.  She notes associated nausea and mild periumbilical pain.  She denies any fevers, chills.  Patient also states she has been having chest pain which started today and feels like a pressure in the midsternal region.  She denies any associated shortness of breath.  She denies any aggravating or alleviating factors of the chest pressure.  Patient blood pressure was running high today with a systolic in the 696V and she called her primary care doctor.  She states they recommended that come to the ER.  She denies any SI, HI.  She does note auditory hallucinations which she states are chronic and unchanged for her.  She states she has been compliant with all medications.  Past Medical History:  Diagnosis Date  . Anemia   . Anorexia   . Anxiety   . Arthritis    "hands" (09/18/2016)  . Chronic kidney disease   . Chronic lower back pain   . Chronic renal insufficiency   . Deaf   . Deafness   . Depression   . Diabetes ([place])    "borderline"  . Eating disorder   . Endometriosis   . GERD (gastroesophageal reflux disease)   . History of kidney stones   . HTN (hypertension)   . Hyperlipidemia   . Hypertension   . 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 Migraine    "frequency varies" (09/18/2016)  . OCD (obsessive compulsive disorder)   . Pneumonia 2011  . PONV (postoperative nausea and vomiting)   . Pulmonary embolism ([place]) 10/2001  . Seizures ([place]) 09/2001 X 1   "day after my hysterectomy"  . Seizures  ([place])   . Tachycardia     Patient Active Problem List   Diagnosis Date Noted  . Syncope 08/26/2017  . Acute renal failure superimposed on stage 3 chronic kidney disease ([place]) 08/26/2017  . Orthostatic syncope   . Urinary tract infection with hematuria   . OCD (obsessive compulsive disorder) 09/29/2016  . Anorexia nervosa with bulimia 09/29/2016  . Deafness congenital 09/29/2016  . Intractable nausea and vomiting 09/29/2016  . Bloody diarrhea 09/29/2016  . AKI (acute kidney injury) ([place]) 09/29/2016  . Severe dehydration 09/29/2016  . Severe protein-calorie malnutrition ([place]) 09/29/2016  . Congenital hypothyroidism 09/29/2016  . Hypertriglyceridemia 09/29/2016  . Anemia 09/29/2016  . Endometriosis 09/29/2016  . MDD (major depressive disorder) 09/29/2016  . Hyperlipidemia 09/29/2016  . CKD (chronic kidney disease), stage III ([place]) 09/29/2016  . Acute kidney injury ([place]) 09/29/2016  . OAB (overactive bladder) 09/29/2016  . Sepsis ([place]) 09/18/2016  . Acute pyelonephritis 09/18/2016  . MDD (major depressive disorder), recurrent, severe, with psychosis ([place]) 12/11/2014  . Eating disorder, unspecified 11/23/2012  . Hypothyroidism 11/23/2012  . Dyslipidemia 11/23/2012  . Sinus tachycardia 11/23/2012  . CKD (chronic kidney disease) stage 3, GFR 30-59 ml/min ([facility]) 11/18/2012  . Protein-calorie malnutrition, severe ([place]) 11/13/2012  . Abdominal pain, epigastric 11/12/2012  . Dehydration 11/12/2012  . Acute on chronic  renal failure ([place]) 11/12/2012  . Leukocytosis, unspecified 11/12/2012  . Hyponatremia 11/12/2012  . Borderline personality disorder ([place]) 11/12/2012    Past Surgical History:  Procedure Laterality Date  . ABDOMINAL HYSTERECTOMY  09/2001   'except for right ovary'  . BREAST LUMPECTOMY Left 2000   benign  . BREAST LUMPECTOMY Right 2002   benign  . COCHLEAR IMPLANT Bilateral 2004-2010   "right-left"  . COLONOSCOPY  06/2015    Dr Rolm Bookbinder at [facility] for  rectal bleeding: internal hemorrhoids, small anal fissure.    . ESOPHAGOGASTRODUODENOSCOPY N/A 10/07/2016   Procedure: ESOPHAGOGASTRODUODENOSCOPY (EGD);  Surgeon: Ladene Artist, MD;  Location: [facility] ENDOSCOPY;  Service: Endoscopy;  Laterality: N/A;  . EYE MUSCLE SURGERY Left 1980   "lazy eye"  . EYE MUSCLE SURGERY Right 1990   "lazy eye"  . LAPAROSCOPIC ABDOMINAL EXPLORATION  2001 and 2002   for endometriosis     OB History   No obstetric history on file.      Home Medications    Prior to Admission medications   Medication Sig Start Date End Date Taking? Authorizing Provider  gemfibrozil (LOPID) 600 MG tablet TAKE 1 TABLET BY MOUTH IN THE MORNING. TAKE 1/2 TABLET IN THE EVENING 09/09/17  Yes [provider]  traZODone (DESYREL) 50 MG tablet Take by mouth. 04/27/18  Yes [provider]  venlafaxine XR (EFFEXOR-XR) 75 MG 24 hr capsule Take by mouth. 08/13/17  Yes [provider]  acetaminophen (TYLENOL) 500 MG tablet Take 1,000-3,000 mg by mouth See admin instructions. Take 2 tablets in the morning, at lunch and take 6 tablets at bedtime    [provider]  fenofibrate (TRICOR) 48 MG tablet Take 48 mg by mouth at bedtime.    [provider]  fluticasone (FLONASE) 50 MCG/ACT nasal spray Place 2 sprays into both nostrils daily.  05/03/17   [provider]  gemfibrozil (LOPID) 600 MG tablet Take 300-600 mg by mouth See admin instructions. Taking one tablet (600mg ) in the AM and 1/2 Tab (300mg ) in the evening. 07/17/17   [provider]  hydrOXYzine (ATARAX/VISTARIL) 50 MG tablet Take 50 mg by mouth 3 (three) times daily as needed for itching.     [provider]  lamoTRIgine (LAMICTAL) 200 MG tablet Take 200 mg by mouth daily.    [provider]  levothyroxine (SYNTHROID, LEVOTHROID) 150 MCG tablet Take 150 mcg by mouth daily. 07/17/17   [provider]  lurasidone (LATUDA) 80 MG TABS tablet Take 80 mg by  mouth daily with breakfast.    [provider]  metoprolol succinate (TOPROL-XL) 50 MG 24 hr tablet Take 50 mg by mouth daily. Take with or immediately following a meal.    [provider]  Multiple Vitamin (MULTIVITAMIN WITH MINERALS) TABS Take 1 tablet by mouth daily.    [provider]  mupirocin ointment (BACTROBAN) 2 % Place 1 application into the nose 2 (two) times daily. 08/29/17   Georgette Shell, MD  omega-3 acid ethyl esters (LOVAZA) 1 g capsule Take 2 g by mouth 2 (two) times daily.    [provider]  pantoprazole (PROTONIX) 40 MG tablet Take 40 mg by mouth daily.    [provider]  phenazopyridine (PYRIDIUM) 100 MG tablet Take 100 mg by mouth 3 (three) times daily as needed for pain.    [provider]  pregabalin (LYRICA) 50 MG capsule Take 50 mg by mouth 3 (three) times daily.  [provider]  QUEtiapine (SEROQUEL) 100 MG tablet Take 100 mg by mouth at bedtime. Take with 1/2 tablet of Seroquel 50 mg to equal 125 mg    [provider]  topiramate (TOPAMAX) 25 MG tablet Take 75 mg by mouth 2 (two) times daily.    [provider]  traZODone (DESYREL) 100 MG tablet Take 100 mg by mouth at bedtime.    [provider]  venlafaxine XR (EFFEXOR-XR) 75 MG 24 hr capsule Take 225 mg by mouth daily with breakfast.    [provider]  Vitamin D, Ergocalciferol, (DRISDOL) 50000 UNITS CAPS Take 50,000 Units by mouth every 7 (seven) days. On Sunday    [provider]    Family History Family History  Problem Relation Age of Onset  . Cancer Father   . Hyperlipidemia Father   . Osteoarthritis Mother   . Hyperlipidemia Mother     Social History Social History   Tobacco Use  . Smoking status: Never Smoker  . Smokeless tobacco: Never Used  Substance Use Topics  . Alcohol use: Never    Frequency: Never  . Drug use: Never     Allergies   Iodinated diagnostic agents;  Melatonin; Phenylephrine-guaifenesin; Propoxyphene; Abilify [aripiprazole]; Dhea [nutritional supplements]; Dihydroergotamine; Divalproex sodium; Doxycycline; Erythromycin; Indomethacin; Lactose intolerance (gi); Melatonin; Risperidone and related; Shellfish allergy; Statins; Tape; Dihydroergotamine; Lactose intolerance (gi); Nsaids; and Tape   Review of Systems Review of Systems  Constitutional: Positive for appetite change. Negative for chills and fever.  HENT: Negative for rhinorrhea and sore throat.   Eyes: Negative for visual disturbance.  Respiratory: Negative for cough and shortness of breath.   Cardiovascular: Positive for chest pain. Negative for leg swelling.  Gastrointestinal: Positive for abdominal pain and nausea. Negative for diarrhea and vomiting.  Genitourinary: Negative for dysuria, frequency and urgency.  Musculoskeletal: Negative for joint swelling and neck pain.  Skin: Negative for rash and wound.  Neurological: Negative for syncope and numbness.  Psychiatric/Behavioral: Positive for hallucinations. Negative for suicidal ideas.  All other systems reviewed and are negative.    Physical Exam Updated Vital Signs BP (!) 119/97 (BP Location: Left Arm)   Pulse 93   Temp 98.1 F (36.7 C) (Oral)   Resp 12   Ht 5\' 8"  (1.727 m)   Wt 75.3 kg   LMP  (LMP Unknown)   SpO2 97%   BMI 25.24 kg/m   Physical Exam Vitals signs and nursing note reviewed.  Constitutional:      Appearance: She is well-developed.  HENT:     Head: Normocephalic and atraumatic.  Eyes:     Conjunctiva/sclera: Conjunctivae normal.  Neck:     Musculoskeletal: Neck supple.  Cardiovascular:     Rate and Rhythm: Regular rhythm. Tachycardia present.     Heart sounds: Normal heart sounds. No murmur.  Pulmonary:     Effort: Pulmonary effort is normal. No respiratory distress.     Breath sounds: Normal breath sounds. No wheezing or rales.  Abdominal:     General: Bowel sounds are normal. There is  no distension.     Palpations: Abdomen is soft.     Tenderness: There is no abdominal tenderness.  Musculoskeletal: Normal range of motion.        General: No tenderness or deformity.  Skin:    General: Skin is warm and dry.     Findings: No erythema or rash.  Neurological:     Mental Status: She is alert and oriented to person,  place, and time.  Psychiatric:        Behavior: Behavior normal.      ED Treatments / Results  Labs (all labs ordered are listed, but only abnormal results are displayed) Labs Reviewed  LIPASE, BLOOD - Abnormal; Notable for the following components:      Result Value   Lipase 160 (*)    All other components within normal limits  COMPREHENSIVE METABOLIC PANEL - Abnormal; Notable for the following components:   Sodium 134 (*)    CO2 18 (*)    BUN 33 (*)    Creatinine, Ser 2.32 (*)    Calcium 10.8 (*)    GFR calc non Af Amer 25 (*)    GFR calc Af Amer 30 (*)    All other components within normal limits  TROPONIN I  ETHANOL  CBC WITH DIFFERENTIAL/PLATELET  PREGNANCY, URINE  RAPID URINE DRUG SCREEN, HOSP PERFORMED  CBG MONITORING, ED    EKG None  Radiology Ct Abdomen Pelvis Wo Contrast  Result Date: 06/23/2018 CLINICAL DATA:  Poor p.o. intake 3 days. Chest pain and dizziness today with nausea and abdominal pain. EXAM: CT ABDOMEN AND PELVIS WITHOUT CONTRAST TECHNIQUE: Multidetector CT imaging of the abdomen and pelvis was performed following the standard protocol without IV contrast. COMPARISON:  06/28/2014 and 11/17/2012 as well as pelvic CT 05/23/2018 FINDINGS: Lower chest: Lung bases are clear.  Heart is normal size. Hepatobiliary: Subtle fatty infiltration adjacent the gallbladder fossa. Gallbladder mildly prominent likely due recent poor p.o. intake. No gallstones or adjacent inflammatory change. Biliary tree is normal. Pancreas: Normal Spleen: Normal. Adrenals/Urinary Tract: Adrenal glands are normal. Kidneys are normal in size without  hydronephrosis or nephrolithiasis. Subcentimeter right midpole renal cortical hypodensity too small to characterize but likely a cyst. Ureters and bladder are normal. Stomach/Bowel: Stomach and small bowel are normal. Appendix is normal. Colon is normal Vascular/Lymphatic: Normal. Reproductive: Previous hysterectomy. Left ovary not visualized. Right ovary unremarkable. Other: No free fluid or focal inflammatory change. Musculoskeletal: Unremarkable. IMPRESSION: No acute findings in the abdomen/pelvis. Subcentimeter right renal cortical hypodensity too small to characterize but likely a cyst Electronically Signed   By: Marin Olp M.D.   On: 06/23/2018 16:54   Dg Chest 2 View  Result Date: 06/23/2018 CLINICAL DATA:  Chest pain EXAM: CHEST - 2 VIEW COMPARISON:  04/29/2017 FINDINGS: The heart size and mediastinal contours are within normal limits. Both lungs are clear. The visualized skeletal structures are unremarkable. IMPRESSION: No active cardiopulmonary disease. Electronically Signed   By: Franchot Gallo M.D.   On: 06/23/2018 15:27    Procedures Procedures (including critical care time)  Medications Ordered in ED Medications  sodium chloride 0.9 % bolus 1,000 mL (0 mLs Intravenous Stopped 06/23/18 1658)  ondansetron (ZOFRAN) injection 4 mg (4 mg Intravenous Given 06/23/18 1538)  sodium chloride 0.9 % bolus 1,000 mL (1,000 mLs Intravenous New Bag/Given 06/23/18 1705)  acetaminophen (TYLENOL) tablet 650 mg (650 mg Oral Given 06/23/18 1702)     Initial Impression / Assessment and Plan / ED Course  I have reviewed the triage vital signs and the nursing notes.  Pertinent labs & imaging results that were available during my care of the patient were reviewed by me and considered in my medical decision making (see chart for details).        Patient presents with multiple medical complaints.  She has a pan positive ROS.  Reason for presentation today was due to anorexia.  Patient states she just does  not feel like eating.  She denies any SI, HI.  Her blood work shows a slightly elevated creatinine however within her normal range.  Patient was noted to be elevated but CT shows no evidence of pancreatitis.  She had a troponin which is negative.  Her chest x-ray shows no acute findings.  Her symptoms today are likely due to her chronic anorexia nervosa.  She states that she follows with to a psychologist from the [facility]  She states she has been talking to them today and will follow-up with them tomorrow.  She is resting comfortably in bed, no acute distress, nontoxic, non-lethargic.  Vital signs stable.  Patient ready and stable for discharge.   At this time there does not appear to be any evidence of an acute emergency medical condition and the patient appears stable for discharge with appropriate outpatient follow up.Diagnosis was discussed with patient who verbalizes understanding and is agreeable to discharge. Pt case discussed with Dr. Stark Jock who agrees with my plan.   Final Clinical Impressions(s) / ED Diagnoses   Final diagnoses:  None    ED Discharge Orders    None       Rachel Moulds 06/24/18 Nori Riis, MD 06/24/18 [phone]

## 2018-06-23 NOTE — Discharge Instructions (Signed)
Drink plenty of fluids.  Eat regular meals.  Please follow-up with your psychologist tomorrow.  Return to the ED immediately for new or worsening symptoms or concerns, such as chest pain, shortness of breath, vomiting, abdominal pain, fevers or any concerns at all.

## 2018-06-23 NOTE — ED Notes (Signed)
Patient calm, no distress noted, stating pain 9/10

## 2018-06-23 NOTE — ED Notes (Signed)
Pt is voicing concern regarding being discharged. SHe feels that she should be admitted d/t not eating/drinking for 3 days. I explained to the patient that her discharge is based on findings of tests that we do and she is in the care of the ED physician. Pt states her therapist wants her to be admitted.

## 2018-06-23 NOTE — ED Notes (Signed)
Pt requesting pain medication. Pt informed will notify PA Caitlyn

## 2018-06-23 NOTE — ED Notes (Signed)
ED Provider at bedside-Dr Delo 

## 2018-06-23 NOTE — ED Notes (Signed)
Pt states she is not eating or drinking x 3 days r/t family issues. Pt states she has had sip of water today. Pt c/o dizziness and chest pain that started this am. She describes pain as pressure in center of chest, also c/o nausea with abdominal pain.  Pt denies fever. Denies SI/HI. Pt has cochlear implants.

## 2018-06-23 NOTE — ED Notes (Signed)
CBG 84. 

## 2018-07-02 DIAGNOSIS — R109 Unspecified abdominal pain: Secondary | ICD-10-CM | POA: Insufficient documentation

## 2018-09-01 ENCOUNTER — Other Ambulatory Visit: Payer: Self-pay | Admitting: Nephrology

## 2018-09-01 ENCOUNTER — Other Ambulatory Visit (HOSPITAL_COMMUNITY): Payer: Self-pay | Admitting: Nephrology

## 2018-09-01 DIAGNOSIS — R110 Nausea: Secondary | ICD-10-CM

## 2018-09-01 DIAGNOSIS — R1110 Vomiting, unspecified: Secondary | ICD-10-CM

## 2018-09-01 DIAGNOSIS — R197 Diarrhea, unspecified: Secondary | ICD-10-CM

## 2018-09-02 ENCOUNTER — Other Ambulatory Visit: Payer: Self-pay | Admitting: Nephrology

## 2018-09-02 ENCOUNTER — Other Ambulatory Visit (HOSPITAL_COMMUNITY): Payer: Self-pay | Admitting: Nephrology

## 2018-09-02 ENCOUNTER — Other Ambulatory Visit: Payer: Self-pay

## 2018-09-02 ENCOUNTER — Ambulatory Visit (HOSPITAL_COMMUNITY)
Admission: RE | Admit: 2018-09-02 | Discharge: 2018-09-02 | Disposition: A | Discharge status: Home / Self Care | Payer: Medicare Other | Source: Ambulatory Visit | Attending: Nephrology | Admitting: Nephrology

## 2018-09-02 DIAGNOSIS — R1110 Vomiting, unspecified: Secondary | ICD-10-CM

## 2018-09-02 DIAGNOSIS — R197 Diarrhea, unspecified: Secondary | ICD-10-CM

## 2018-09-02 DIAGNOSIS — N183 Chronic kidney disease, stage 3 unspecified: Secondary | ICD-10-CM

## 2018-09-02 DIAGNOSIS — R809 Proteinuria, unspecified: Secondary | ICD-10-CM

## 2018-09-09 ENCOUNTER — Ambulatory Visit (INDEPENDENT_AMBULATORY_CARE_PROVIDER_SITE_OTHER): Payer: Medicare Other | Admitting: Cardiology

## 2018-09-09 ENCOUNTER — Other Ambulatory Visit: Payer: Self-pay

## 2018-09-09 ENCOUNTER — Encounter: Payer: Self-pay | Admitting: Cardiology

## 2018-09-09 VITALS — BP 130/86 | HR 121 | Temp 97.2°F | Ht 68.0 in | Wt 167.0 lb

## 2018-09-09 DIAGNOSIS — F5081 Binge eating disorder: Secondary | ICD-10-CM | POA: Diagnosis not present

## 2018-09-09 DIAGNOSIS — N183 Chronic kidney disease, stage 3 unspecified: Secondary | ICD-10-CM

## 2018-09-09 DIAGNOSIS — H905 Unspecified sensorineural hearing loss: Secondary | ICD-10-CM | POA: Diagnosis not present

## 2018-09-09 DIAGNOSIS — E781 Pure hyperglyceridemia: Secondary | ICD-10-CM

## 2018-09-09 DIAGNOSIS — I129 Hypertensive chronic kidney disease with stage 1 through stage 4 chronic kidney disease, or unspecified chronic kidney disease: Secondary | ICD-10-CM

## 2018-09-09 DIAGNOSIS — R0789 Other chest pain: Secondary | ICD-10-CM | POA: Diagnosis not present

## 2018-09-09 NOTE — Progress Notes (Signed)
Primary Physician/Referring:  Bernerd Limbo, MD  Patient ID: Brandi Love, female    DOB: 1975/06/22, 43 y.o.   MRN: [id number]  Chief Complaint  Patient presents with  . Hyperlipidemia  . Follow-up    68yr   HPI: Brandi Love  is a 43y.o. female   with eating disorder, depression with psychosis, borderline personality disorder, OCD, hypothyroidism, deafness with cochlear implants, and chronic kidney disease stage III, and is attributed to NSAID use and also patient once try to commit suicide by consuming excessive amounts of aspirin in the remote past. Has diet controlled DM. admitted to the hospital with syncopal episode on 08/24/2017, felt to be due to dehydration. [facility]  She has had multiple hospital admissions for eating disorder recently in March 2020.   Presently continues to have occasional sharp chest pain, diffuse and non exertional. No PND or orthopnea or palpitations. No leg edema or hemoptysis.   [facility] but also sees me from cardiac standpoint for chest pain and hyperlipidemia.  Past Medical History:  Diagnosis Date  . Anemia   . Anorexia   . Anxiety   . Arthritis    "hands" (09/18/2016)  . Chronic kidney disease   . Chronic lower back pain   . Chronic renal insufficiency   . Deaf   . Deafness   . Depression   . Diabetes ([place]    "borderline"  . Eating disorder   . Endometriosis   . GERD (gastroesophageal reflux disease)   . History of kidney stones   . Hyperlipidemia   . 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 Migraine    "frequency varies" (09/18/2016)  . OCD (obsessive compulsive disorder)   . Pneumonia 2011  . PONV (postoperative nausea and vomiting)   . [place] ([place]  . Seizures ([place] 09[place] X 1   "day after my hysterectomy"  . [place]   . Tachycardia     Past Surgical History:  Procedure Laterality Date  . ABDOMINAL HYSTERECTOMY  09/2001   'except for right ovary'  . BREAST LUMPECTOMY Left 2000   benign  . BREAST LUMPECTOMY Right 2002   benign  . COCHLEAR IMPLANT Bilateral 2004-2010   "right-left"  . COLONOSCOPY  06/2015    [facility] [place] [place] [facility] [place] internal [place]oids, small anal fissure.    . ESOPHAGOGASTRODUODENOSCOPY N/A 10/07/2016   Procedure: ESOPHAGOGASTRODUODENOSCOPY (EGD);  Surgeon: SLadene Artist MD;  [facility];  Service: Endoscopy;  Laterality: N/A;  . EYE MUSCLE SURGERY Left 1980   "lazy eye"  . EYE MUSCLE SURGERY Right 1990   "lazy eye"  . LAPAROSCOPIC ABDOMINAL EXPLORATION  2001 and 2002   for endometriosis    Social History   Socioeconomic History  . Marital status: Single    Spouse name: Not on file  . Number of children: 0  . Years of education: 170 . Highest education level: Not on file  Occupational History  . Occupation: Disabled.  Social Needs  . Financial resource strain: Not on file  . Food insecurity:    Worry: Not on file    Inability: Not on file  . Transportation needs:    Medical: Not on file    Non-medical: Not on file  Tobacco Use  . Smoking status: Never Smoker  .  Smokeless tobacco: Never Used  Substance and Sexual Activity  . Alcohol use: Never    Frequency: Never  . Drug use: Never  . Sexual activity: Never    Birth control/protection: Surgical  Lifestyle  . Physical activity:    Days per week: Not on file    Minutes per session: Not on file  . Stress: Not on file  Relationships  . Social connections:    Talks on phone: Not on file    Gets together: Not on file    Attends religious service: Not on file    Active member of club or organization: Not on file    Attends meetings of clubs or organizations: Not on file    Relationship status: Not on file  . Intimate partner violence:    Fear of current or ex partner: Not on file     Emotionally abused: Not on file    Physically abused: Not on file    Forced sexual activity: Not on file  Other Topics Concern  . Not on file  Social History Narrative   ** Merged History Encounter **       Lives alone.  Single.  No family in town.      Review of Systems  Constitution: Negative for chills, decreased appetite, malaise/fatigue and weight gain.  Cardiovascular: Positive for chest pain. Negative for dyspnea on exertion, leg swelling and syncope.  Endocrine: Negative for cold intolerance.  Hematologic/Lymphatic: Does not bruise/bleed easily.  Musculoskeletal: Negative for joint swelling.  Gastrointestinal: Negative for abdominal pain, anorexia, change in bowel habit, hematochezia and melena.  Neurological: Negative for headaches and light-headedness.  Psychiatric/Behavioral: Negative for depression and substance abuse.  All other systems reviewed and are negative.     Objective  Blood pressure 130/86, pulse (!) 121, temperature (!) 97.2 F (36.2 C), weight 167 lb (75.8 kg), SpO2 97 %. Body mass index is 25.39 kg/m.    Physical Exam  Constitutional: She appears well-developed and well-nourished. No distress.  HENT:  Head: Atraumatic.  Eyes: Conjunctivae are normal.  Neck: Neck supple. No JVD present. No thyromegaly present.  Cardiovascular: Normal rate, regular rhythm, normal heart sounds and intact distal pulses. Exam reveals no gallop.  No murmur heard. Pulmonary/Chest: Effort normal and breath sounds normal.  Pectus excavatum  Abdominal: Soft. Bowel sounds are normal.  Musculoskeletal: Normal range of motion.  Neurological: She is alert.  Skin: Skin is warm and dry.  Psychiatric: She has a normal mood and affect.   Radiology: No results found.  Laboratory examination:   Labs 05/05/2018: Triglycerides 1386.  BUN 36, creatinine 2.3, eGFR 26 mL.  TSH normal.  Labs 08/31/2018: Serum glucose 176m, BUN 23, creatinine 1.77, eGFR 35 well.  Potassium 5.5.  CMP  otherwise normal.  CMP Latest Ref Rng & Units 06/23/2018 08/29/2017 08/28/2017  Glucose 70 - 99 mg/dL 97 116(H) 102(H)  BUN 6 - 20 mg/dL 33(H) 22(H) 25(H)  Creatinine 0.44 - 1.00 mg/dL 2.32(H) 1.50(H) 1.67(H)  Sodium 135 - 145 mmol/L 134(L) 141 143  Potassium 3.5 - 5.1 mmol/L 4.2 4.0 4.0  Chloride 98 - 111 mmol/L 102 114(H) 116(H)  CO2 22 - 32 mmol/L 18(L) 16(L) 20(L)  Calcium 8.9 - 10.3 mg/dL 10.8(H) 9.5 9.3  Total Protein 6.5 - 8.1 g/dL 8.1 - -  Total Bilirubin 0.3 - 1.2 mg/dL 0.6 - -  Alkaline Phos 38 - 126 U/L 85 - -  AST 15 - 41 U/L 32 - -  ALT 0 - 44  U/L 41 - -   CBC Latest Ref Rng & Units 06/23/2018 08/28/2017 08/27/2017  WBC 4.0 - 10.5 K/uL 9.4 5.2 5.7  Hemoglobin 12.0 - 15.0 g/dL 14.8 10.2(L) 11.5(L)  Hematocrit 36.0 - 46.0 % 44.1 30.7(L) 34.2(L)  Platelets 150 - 400 K/uL 391 203 229   Labs 08/29/2017: Potassium 4.0, serum glucose 116 mg, BUN 22, creatinine 1.5, eGFR 43 mL.  HB 10.2/HCT 30.7, platelets 203, normal indicis.  Labs 06/24/2017: Total cholesterol 196, triglycerides 1140, HDL 36, direct LDL 77.  HEMOGLOBIN A1C Lab Results  Component Value Date   HGBA1C 5.5 10/10/2016   MPG 111 10/10/2016   TSH No results for input(s): TSH in the last 8760 hours.  PRN Meds:. Medications Discontinued During This Encounter  Medication Reason  . fenofibrate (TRICOR) 48 MG tablet Discontinued by provider  . gemfibrozil (LOPID) 409 MG tablet Duplicate  . hydrOXYzine (ATARAX/VISTARIL) 50 MG tablet Discontinued by provider  . metoprolol succinate (TOPROL-XL) 50 MG 24 hr tablet Discontinued by provider  . mupirocin ointment (BACTROBAN) 2 % No longer needed (for PRN medications)  . omega-3 acid ethyl esters (LOVAZA) 1 g capsule Discontinued by provider  . phenazopyridine (PYRIDIUM) 100 MG tablet No longer needed (for PRN medications)  . promethazine (PHENERGAN) 25 MG tablet Change in therapy  . QUEtiapine (SEROQUEL) 811 MG tablet Duplicate  . traZODone (DESYREL) 100 MG tablet Change in  therapy  . venlafaxine XR (EFFEXOR-XR) 75 MG 24 hr capsule Duplicate   Current Meds  Medication Sig  . acetaminophen (TYLENOL) 500 MG tablet Take 1,000-3,000 mg by mouth See admin instructions. Take 2 tablets in the morning, at lunch and take 6 tablets at bedtime  . aspirin EC 81 MG tablet Take 81 mg by mouth daily.  . cyclobenzaprine (FLEXERIL) 5 MG tablet Take by mouth 3 (three) times daily as needed.   . fluticasone (FLONASE) 50 MCG/ACT nasal spray Place 2 sprays into both nostrils 2 (two) times daily as needed.   Marland Kitchen gemfibrozil (LOPID) 600 MG tablet TAKE 1 TABLET BY MOUTH IN THE MORNING. TAKE 1/2 TABLET IN THE EVENING  . lamoTRIgine (LAMICTAL) 150 MG tablet Take 150 mg by mouth daily.   Marland Kitchen levothyroxine (SYNTHROID) 137 MCG tablet Take 137 mcg by mouth daily.   Marland Kitchen lurasidone (LATUDA) 80 MG TABS tablet Take 80 mg by mouth daily with breakfast.  . magnesium oxide (MAG-OX) 400 (241.3 Mg) MG tablet Take by mouth 2 (two) times a day.  . Melatonin 5 MG TABS Take 3 mg by mouth at bedtime.  . Multiple Vitamin (MULTIVITAMIN WITH MINERALS) TABS Take 1 tablet by mouth daily.  . pantoprazole (PROTONIX) 40 MG tablet Take 40 mg by mouth daily.  . pregabalin (LYRICA) 50 MG capsule Take 50 mg by mouth 2 (two) times daily.   . QUEtiapine (SEROQUEL) 25 MG tablet Take 12.5 mg by mouth at bedtime.  . topiramate (TOPAMAX) 50 MG tablet Take 50 mg by mouth 2 (two) times daily.   . traZODone (DESYREL) 50 MG tablet Take by mouth. 1.5 tabs bedtime  . venlafaxine XR (EFFEXOR-XR) 75 MG 24 hr capsule Take by mouth. 3 daily  . Vitamin D, Ergocalciferol, (DRISDOL) 50000 UNITS CAPS Take 50,000 Units by mouth every 7 (seven) days. On Sunday  . [DISCONTINUED] gemfibrozil (LOPID) 600 MG tablet Take 300-600 mg by mouth See admin instructions. Taking one tablet (633m) in the AM and 1/2 Tab (3038m in the evening.  . [DISCONTINUED] promethazine (PHENERGAN) 25 MG tablet Take by mouth 2 (  two) times daily as needed.    Cardiac  Studies:    Echocardiogram 09/30/2016: Normal LV size, normal LV systolic function, EF 20-99% without regional wall motion abnormality.  Stress test- 10/13/12 Negative for ischemia, there was no significant ST.   depression c.f. resting EKG. Walked for 7 minutes.   Peak HR- 174/min ( 93% MPHR), achieved 8.2 METS.  Assessment   Atypical chest pain - Plan: EKG 12-Lead  Acquired hypertriglyceridemia  Congenital deafness - S/P Cochlear implant  Binge eating disorder  Chronic kidney disease, stage 3 (HCC) due to NSAID abuse EKG 09/09/2018: Sinus tachycardia cardiac rate of 104 bpm, otherwise normal EKG.   Recommendations:   Patient is here on annual visit for chest pain syndrome and also hypertension hyperlipidemia.  It is unfortunate that she has eating disorder at the same time and admits to eating poorly.  She may have a competent of familial hypertension ischemia however it'll be extremely difficult in view of her underlying mental disorder. She does have family history of hypertension ischemia in both her parents but no history of premature coronary artery disease.  I'm afraid to start her on a statin in view of multiple medical issues and mental disorder.  Due to her chest pain I didn't repeat EKG today essentially revealing normal sinus rhythm without ischemia.  She is certainly at risk for developing significant coronary artery disease although she is only 43 years of age in view of metabolic syndrome with hyperglycemia, mildly elevated blood pressure and also hypertension ischemia.  As she was having active chest pain and EKG doesn't reveal any ischemia, I did not order any tests. Echocardiogram in 2018 was normal.  I will see her back in a year as patient patient is to see me, although I'm not contributing much.  I simply tried to reassure her.  Adrian Prows, MD, [facility] 09/09/2018, 2:55 PM [facility]. [place] Pager: [phone] Office: [phone] If no answer Cell  [phone]

## 2018-10-01 ENCOUNTER — Other Ambulatory Visit: Payer: Self-pay | Admitting: Podiatry

## 2018-10-01 ENCOUNTER — Other Ambulatory Visit: Payer: Self-pay

## 2018-10-01 ENCOUNTER — Ambulatory Visit (INDEPENDENT_AMBULATORY_CARE_PROVIDER_SITE_OTHER): Payer: Medicare Other | Admitting: Podiatry

## 2018-10-01 ENCOUNTER — Ambulatory Visit (INDEPENDENT_AMBULATORY_CARE_PROVIDER_SITE_OTHER): Payer: Medicare Other

## 2018-10-01 VITALS — BP 111/79 | HR 114 | Temp 97.2°F

## 2018-10-01 DIAGNOSIS — M2141 Flat foot [pes planus] (acquired), right foot: Secondary | ICD-10-CM | POA: Diagnosis not present

## 2018-10-01 DIAGNOSIS — R630 Anorexia: Secondary | ICD-10-CM | POA: Insufficient documentation

## 2018-10-01 DIAGNOSIS — E1159 Type 2 diabetes mellitus with other circulatory complications: Secondary | ICD-10-CM | POA: Insufficient documentation

## 2018-10-01 DIAGNOSIS — M7750 Other enthesopathy of unspecified foot: Secondary | ICD-10-CM

## 2018-10-01 DIAGNOSIS — Q6652 Congenital pes planus, left foot: Secondary | ICD-10-CM

## 2018-10-01 DIAGNOSIS — I10 Essential (primary) hypertension: Secondary | ICD-10-CM | POA: Insufficient documentation

## 2018-10-01 DIAGNOSIS — Q6651 Congenital pes planus, right foot: Secondary | ICD-10-CM

## 2018-10-01 DIAGNOSIS — M2142 Flat foot [pes planus] (acquired), left foot: Secondary | ICD-10-CM | POA: Diagnosis not present

## 2018-10-01 DIAGNOSIS — R569 Unspecified convulsions: Secondary | ICD-10-CM | POA: Insufficient documentation

## 2018-10-01 DIAGNOSIS — M778 Other enthesopathies, not elsewhere classified: Secondary | ICD-10-CM

## 2018-10-01 DIAGNOSIS — M7751 Other enthesopathy of right foot: Secondary | ICD-10-CM | POA: Diagnosis not present

## 2018-10-01 DIAGNOSIS — K921 Melena: Secondary | ICD-10-CM | POA: Insufficient documentation

## 2018-10-01 DIAGNOSIS — F489 Nonpsychotic mental disorder, unspecified: Secondary | ICD-10-CM | POA: Insufficient documentation

## 2018-10-01 DIAGNOSIS — M7752 Other enthesopathy of left foot: Secondary | ICD-10-CM | POA: Diagnosis not present

## 2018-10-01 DIAGNOSIS — M779 Enthesopathy, unspecified: Secondary | ICD-10-CM | POA: Diagnosis not present

## 2018-10-01 DIAGNOSIS — I152 Hypertension secondary to endocrine disorders: Secondary | ICD-10-CM | POA: Insufficient documentation

## 2018-10-01 DIAGNOSIS — H9190 Unspecified hearing loss, unspecified ear: Secondary | ICD-10-CM | POA: Insufficient documentation

## 2018-10-07 NOTE — Progress Notes (Signed)
Subjective:   Patient ID: Brandi Love, female   DOB: 43 y.o.   MRN: [id number]   HPI 43 year old female presents the office today for concerns of bilateral foot pain.  She states that she was born with flat feet inserts orthotics in the trial however she lost them several years ago.  She is starting to get foot pain.  She states that she gets pain she is on her feet.  She denies any recent injury or falls.  She gets pain she points more to the top of her foot as well as the arch.  No swelling.  No numbness or tingling.  She presents today with interpreter as she is deaf.   Review of Systems  All other systems reviewed and are negative.  Past Medical History:  Diagnosis Date  . Anemia   . Anorexia   . Anxiety   . Arthritis    "hands" (09/18/2016)  . Chronic kidney disease   . Chronic lower back pain   . Chronic renal insufficiency   . Deaf   . Deafness   . Depression   . Diabetes ([place])    "borderline"  . Eating disorder   . Endometriosis   . GERD (gastroesophageal reflux disease)   . History of kidney stones   . Hyperlipidemia   . 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 Migraine    "frequency varies" (09/18/2016)  . OCD (obsessive compulsive disorder)   . Pneumonia 2011  . PONV (postoperative nausea and vomiting)   . Pulmonary embolism ([place]) 10/2001  . Seizures ([place]) 09/2001 X 1   "day after my hysterectomy"  . Seizures ([place])   . Tachycardia     Past Surgical History:  Procedure Laterality Date  . ABDOMINAL HYSTERECTOMY  09/2001   'except for right ovary'  . BREAST LUMPECTOMY Left 2000   benign  . BREAST LUMPECTOMY Right 2002   benign  . COCHLEAR IMPLANT Bilateral 2004-2010   "right-left"  . COLONOSCOPY  06/2015    Dr Rolm Bookbinder at [facility] for rectal bleeding: internal hemorrhoids, small anal fissure.    . ESOPHAGOGASTRODUODENOSCOPY N/A 10/07/2016   Procedure: ESOPHAGOGASTRODUODENOSCOPY (EGD);  Surgeon: Ladene Artist, MD;  [facility]  END[facility];  Service: Endoscopy;  Laterality: N/A;  . EYE MUSCLE SURGERY Left 1980   "lazy eye"  . EYE MUSCLE SURGERY Right 1990   "lazy eye"  . LAPAROSCOPIC ABDOMINAL EXPLORATION  2001 and 2002   for endometriosis     Current Outpatient Medications:  .  docusate sodium (COLACE) 100 MG capsule, Take 2 capsules at bedtime, Disp: , Rfl:  .  lidocaine (LIDODERM) 5 %, Place onto the skin., Disp: , Rfl:  .  Multiple Vitamins-Minerals (TAB-A-VITE) TABS, Take by mouth., Disp: , Rfl:  .  ondansetron (ZOFRAN-ODT) 8 MG disintegrating tablet, Take by mouth., Disp: , Rfl:  .  acetaminophen (TYLENOL) 500 MG tablet, Take 1,000-3,000 mg by mouth See admin instructions. Take 2 tablets in the morning, at lunch and take 6 tablets at bedtime, Disp: , Rfl:  .  aspirin EC 81 MG tablet, Take 81 mg by mouth daily., Disp: , Rfl:  .  cyclobenzaprine (FLEXERIL) 5 MG tablet, Take by mouth 3 (three) times daily as needed. , Disp: , Rfl:  .  fluticasone (FLONASE) 50 MCG/ACT nasal spray, Place 2 sprays into both nostrils 2 (two) times daily as needed. , Disp: , Rfl: 2 .  gemfibrozil (LOPID) 600 MG tablet, TAKE 1 TABLET  BY MOUTH IN THE MORNING. TAKE 1/2 TABLET IN THE EVENING, Disp: , Rfl:  .  lamoTRIgine (LAMICTAL) 150 MG tablet, Take 150 mg by mouth daily. , Disp: , Rfl:  .  levothyroxine (SYNTHROID) 137 MCG tablet, Take 137 mcg by mouth daily. , Disp: , Rfl:  .  lurasidone (LATUDA) 80 MG TABS tablet, Take 80 mg by mouth daily with breakfast., Disp: , Rfl:  .  magnesium oxide (MAG-OX) 400 (241.3 Mg) MG tablet, Take by mouth 2 (two) times a day., Disp: , Rfl:  .  Melatonin 5 MG TABS, Take 3 mg by mouth at bedtime., Disp: , Rfl:  .  Multiple Vitamin (MULTIVITAMIN WITH MINERALS) TABS, Take 1 tablet by mouth daily., Disp: , Rfl:  .  pantoprazole (PROTONIX) 40 MG tablet, Take 40 mg by mouth daily., Disp: , Rfl:  .  pregabalin (LYRICA) 50 MG capsule, Take 50 mg by mouth 2 (two) times daily. , Disp: , Rfl:  .  QUEtiapine  (SEROQUEL) 25 MG tablet, Take 12.5 mg by mouth at bedtime., Disp: , Rfl:  .  topiramate (TOPAMAX) 50 MG tablet, Take 50 mg by mouth 2 (two) times daily. , Disp: , Rfl:  .  traZODone (DESYREL) 50 MG tablet, Take by mouth. 1.5 tabs bedtime, Disp: , Rfl:  .  venlafaxine XR (EFFEXOR-XR) 75 MG 24 hr capsule, Take by mouth. 3 daily, Disp: , Rfl:  .  Vitamin D, Ergocalciferol, (DRISDOL) 50000 UNITS CAPS, Take 50,000 Units by mouth every 7 (seven) days. On Sunday, Disp: , Rfl:   Allergies  Allergen Reactions  . Iodinated Diagnostic Agents Anaphylaxis    'cant breathe'  . Melatonin Itching  . Phenylephrine-Guaifenesin Shortness Of Breath and Swelling    Throat swelling  . Propoxyphene Anaphylaxis  . Abilify [Aripiprazole] Nausea And Vomiting  . Dhea [Nutritional Supplements] Nausea And Vomiting and Swelling  . Dihydroergotamine Nausea And Vomiting  . Divalproex Sodium Nausea And Vomiting and Other (See Comments)    Weight gain  . Doxycycline Nausea And Vomiting  . Erythromycin Nausea And Vomiting  . Fish Oil     Facial edema, itching  . Indomethacin Nausea And Vomiting  . Lactose Intolerance (Gi) Nausea And Vomiting  . Melatonin Itching  . Risperidone And Related Other (See Comments)    Out of body feeling  . Shellfish Allergy Itching and Swelling    Eyes swell  . Statins Other (See Comments)    Cannot tolerate  . Tape Other (See Comments)    Redness/ please use paper tape  . Dihydroergotamine Nausea And Vomiting  . Lactose Intolerance (Gi) Nausea And Vomiting  . Nsaids Other (See Comments)    Unable to ttake due to renal insufficency  . Tape Other (See Comments)    Redness, can use paper tape          Objective:  Physical Exam  General: AAO x3, NAD  Dermatological: Skin is warm, dry and supple bilateral. Nails x 10 are well manicured; remaining integument appears unremarkable at this time. There are no open sores, no preulcerative lesions, no rash or signs of infection  present.  Vascular: Dorsalis Pedis artery and Posterior Tibial artery pedal pulses are 2/4 bilateral with immedate capillary fill time. Pedal hair growth present. No varicosities and no lower extremity edema present bilateral. There is no pain with calf compression, swelling, warmth, erythema.   Neruologic: Grossly intact via light touch bilateral.  Protective threshold with Semmes Wienstein monofilament intact to all pedal sites bilateral.  Negative Tinel sign.  Musculoskeletal: Mild compression of the arch upon weightbearing.  She does get some discomfort on the medial band plantar fascial arch of the foot after she has some dorsal midfoot pain.  There is no specific area pinpoint tenderness.  In both muscles well without major motor intact.  No edema, erythema today.  Muscular strength 5/5 in all groups tested bilateral.  Gait: Unassisted, Nonantalgic.       Assessment:   43 year old female with bilateral foot pain, left foot    Plan:  -Treatment options discussed including all alternatives, risks, and complications -Etiology of symptoms were discussed -X-rays were obtained and reviewed with the patient. There is no evidence of acute fracture or stress fracture identified today. -I do think she will benefit more from orthotics for brace.  I had [name] see her. She was measured for M[place] braces -Voltaren gel as needed -Discussed changes in shoes.   [name]

## 2018-11-02 ENCOUNTER — Ambulatory Visit (INDEPENDENT_AMBULATORY_CARE_PROVIDER_SITE_OTHER): Payer: Medicare Other | Admitting: Orthotics

## 2018-11-02 ENCOUNTER — Other Ambulatory Visit: Payer: Self-pay

## 2018-11-02 DIAGNOSIS — M2142 Flat foot [pes planus] (acquired), left foot: Secondary | ICD-10-CM | POA: Diagnosis not present

## 2018-11-02 DIAGNOSIS — M7752 Other enthesopathy of left foot: Secondary | ICD-10-CM | POA: Diagnosis not present

## 2018-11-02 DIAGNOSIS — M7751 Other enthesopathy of right foot: Secondary | ICD-10-CM

## 2018-11-02 DIAGNOSIS — M2141 Flat foot [pes planus] (acquired), right foot: Secondary | ICD-10-CM

## 2018-11-02 DIAGNOSIS — M6788 Other specified disorders of synovium and tendon, other site: Secondary | ICD-10-CM

## 2018-11-02 DIAGNOSIS — M7750 Other enthesopathy of unspecified foot: Secondary | ICD-10-CM

## 2018-11-02 DIAGNOSIS — M778 Other enthesopathies, not elsewhere classified: Secondary | ICD-10-CM

## 2018-11-02 NOTE — Progress Notes (Signed)
Patient presents today for evaluation/casting for mezzo brace b/l   Patient has hx of the following conditions: Gait instability,  Ankle instabilty,  Gait analysis done and patient displays abnormality of gait in both sagittial and frontal planes, and could benefit in aggressive ankle support.  Patient chose [place] brace w/ lace/speed laces.

## 2018-11-30 ENCOUNTER — Ambulatory Visit: Payer: Medicare Other | Admitting: Orthotics

## 2018-11-30 ENCOUNTER — Ambulatory Visit (INDEPENDENT_AMBULATORY_CARE_PROVIDER_SITE_OTHER): Payer: Medicare Other | Admitting: Podiatry

## 2018-11-30 ENCOUNTER — Other Ambulatory Visit: Payer: Self-pay

## 2018-11-30 ENCOUNTER — Encounter: Payer: Self-pay | Admitting: Podiatry

## 2018-11-30 VITALS — Temp 97.3°F

## 2018-11-30 DIAGNOSIS — M2141 Flat foot [pes planus] (acquired), right foot: Secondary | ICD-10-CM

## 2018-11-30 DIAGNOSIS — E1149 Type 2 diabetes mellitus with other diabetic neurological complication: Secondary | ICD-10-CM

## 2018-11-30 DIAGNOSIS — M2142 Flat foot [pes planus] (acquired), left foot: Secondary | ICD-10-CM | POA: Diagnosis not present

## 2018-11-30 DIAGNOSIS — M7750 Other enthesopathy of unspecified foot: Secondary | ICD-10-CM

## 2018-11-30 DIAGNOSIS — E119 Type 2 diabetes mellitus without complications: Secondary | ICD-10-CM

## 2018-11-30 NOTE — Progress Notes (Signed)
Patient was measured for med necessity extra depth shoes (x2) w/ 3 pair OTS diabetic inserst  OTS BECAUSE PATIENT IS WEARING b/l BRACES   Patient was measured w/ brannock to determine size and width.  Foam impression mold was achieved and deemed appropriate for fabrication of  cmfo.   See DPM chart notes for further documentation and dx codes for determination of medical necessity.  Appropriate forms will be sent to PCP to verify and sign off on medical necessity.

## 2018-12-02 NOTE — Progress Notes (Signed)
Subjective: 8-year female presents with interpreter for Evaluation of foot pain.  She says the brace is been very helpful and she is walking much better not having any significant discomfort to her foot.  She does state she is getting some pain to both of her feet and tingling.  Is been an issue for some time as well.  She is scheduled to follow-up with her primary care physician. Denies any systemic complaints such as fevers, chills, nausea, vomiting. No acute changes since last appointment, and no other complaints at this time.   Objective: AAO x3, NAD DP/PT pulses palpable bilaterally, CRT less than 3 seconds Braces appear to be fitting well.  She has been tying her shoes too tightly and causes indentations the dorsal foot there is no skin breakdown.  Unable to elicit any area of tenderness identified today.  Suggest that she is describing burning, tingling to both feet complex. No open lesions or pre-ulcerative lesions.  No pain with calf compression, swelling, warmth, erythema  Assessment: 43 year old female with chronic bilateral foot pain, improved with bracing; neuropathy  Plan: -All treatment options discussed with the patient including all alternatives, risks, complications.  -Braces appear to be fitting well.  I did have Brandi Love evaluate her as well.  If the laces are causing pressure with insertion of Velcro strap. -Likely she is developing neuropathy.  She denies any weakness or falls.  She is currently on Lyrica.  She is up on her primary physician regards to this.  She is thinking it is coming from diabetes but she does have back pain at times. Consider neurology evaluation/NCV if needed -Patient encouraged to call the office with any questions, concerns, change in symptoms.   Brandi Love DPM

## 2019-02-08 ENCOUNTER — Ambulatory Visit: Payer: Medicare Other | Admitting: Orthotics

## 2019-02-08 ENCOUNTER — Ambulatory Visit: Payer: Medicare Other | Admitting: Podiatry

## 2019-02-15 ENCOUNTER — Ambulatory Visit: Payer: Medicare Other | Admitting: Orthotics

## 2019-02-22 ENCOUNTER — Other Ambulatory Visit: Payer: Self-pay

## 2019-02-22 ENCOUNTER — Ambulatory Visit (INDEPENDENT_AMBULATORY_CARE_PROVIDER_SITE_OTHER): Payer: Medicare Other | Admitting: Orthotics

## 2019-02-22 DIAGNOSIS — M2142 Flat foot [pes planus] (acquired), left foot: Secondary | ICD-10-CM

## 2019-02-22 DIAGNOSIS — E119 Type 2 diabetes mellitus without complications: Secondary | ICD-10-CM | POA: Diagnosis not present

## 2019-02-22 DIAGNOSIS — M6788 Other specified disorders of synovium and tendon, other site: Secondary | ICD-10-CM

## 2019-02-22 DIAGNOSIS — M2141 Flat foot [pes planus] (acquired), right foot: Secondary | ICD-10-CM

## 2019-02-22 NOTE — Progress Notes (Signed)
Patient came in today to pick up diabetic shoes and OTSinserts.  Same was well pleased with fit and function.   The patient could ambulate without any discomfort; there were no signs of any quality issues. The foot ortheses offered full contact with plantar surface and contoured the arch well.   The shoes fit well with no heel slippage and areas of pressure concern.   Patient advised to contact us if any problems arise.  Patient also advised on how to report any issues. 

## 2019-03-02 ENCOUNTER — Other Ambulatory Visit: Payer: Self-pay

## 2019-03-02 ENCOUNTER — Emergency Department (HOSPITAL_BASED_OUTPATIENT_CLINIC_OR_DEPARTMENT_OTHER): Payer: Medicare Other

## 2019-03-02 ENCOUNTER — Emergency Department (HOSPITAL_BASED_OUTPATIENT_CLINIC_OR_DEPARTMENT_OTHER)
Admission: EM | Admit: 2019-03-02 | Discharge: 2019-03-02 | Disposition: A | Discharge status: Home / Self Care | Payer: Medicare Other | Attending: Emergency Medicine | Admitting: Emergency Medicine

## 2019-03-02 DIAGNOSIS — Z7982 Long term (current) use of aspirin: Secondary | ICD-10-CM | POA: Diagnosis not present

## 2019-03-02 DIAGNOSIS — N183 Chronic kidney disease, stage 3 unspecified: Secondary | ICD-10-CM | POA: Insufficient documentation

## 2019-03-02 DIAGNOSIS — R519 Headache, unspecified: Secondary | ICD-10-CM | POA: Insufficient documentation

## 2019-03-02 DIAGNOSIS — Z20828 Contact with and (suspected) exposure to other viral communicable diseases: Secondary | ICD-10-CM | POA: Diagnosis not present

## 2019-03-02 DIAGNOSIS — R0602 Shortness of breath: Secondary | ICD-10-CM | POA: Diagnosis not present

## 2019-03-02 DIAGNOSIS — R059 Cough, unspecified: Secondary | ICD-10-CM

## 2019-03-02 DIAGNOSIS — Z20822 Contact with and (suspected) exposure to covid-19: Secondary | ICD-10-CM

## 2019-03-02 DIAGNOSIS — R05 Cough: Secondary | ICD-10-CM | POA: Diagnosis not present

## 2019-03-02 DIAGNOSIS — Z79899 Other long term (current) drug therapy: Secondary | ICD-10-CM | POA: Insufficient documentation

## 2019-03-02 DIAGNOSIS — E039 Hypothyroidism, unspecified: Secondary | ICD-10-CM | POA: Insufficient documentation

## 2019-03-02 LAB — URINALYSIS, ROUTINE W REFLEX MICROSCOPIC
Bilirubin Urine: NEGATIVE
Glucose, UA: NEGATIVE mg/dL
Hgb urine dipstick: NEGATIVE
Ketones, ur: NEGATIVE mg/dL
Leukocytes,Ua: NEGATIVE
Nitrite: NEGATIVE
Protein, ur: NEGATIVE mg/dL
Specific Gravity, Urine: 1.02 (ref 1.005–1.030)
pH: 5.5 (ref 5.0–8.0)

## 2019-03-02 LAB — MAGNESIUM: Magnesium: 2 mg/dL (ref 1.7–2.4)

## 2019-03-02 LAB — COMPREHENSIVE METABOLIC PANEL
ALT: 27 U/L (ref 0–44)
AST: 26 U/L (ref 15–41)
Albumin: 4 g/dL (ref 3.5–5.0)
Alkaline Phosphatase: 66 U/L (ref 38–126)
Anion gap: 10 (ref 5–15)
BUN: 20 mg/dL (ref 6–20)
CO2: 17 mmol/L — ABNORMAL LOW (ref 22–32)
Calcium: 9.7 mg/dL (ref 8.9–10.3)
Chloride: 104 mmol/L (ref 98–111)
Creatinine, Ser: 1.45 mg/dL — ABNORMAL HIGH (ref 0.44–1.00)
GFR calc Af Amer: 52 mL/min — ABNORMAL LOW (ref >60)
GFR calc non Af Amer: 45 mL/min — ABNORMAL LOW (ref >60)
Glucose, Bld: 275 mg/dL — ABNORMAL HIGH (ref 70–99)
Potassium: 5.9 mmol/L — ABNORMAL HIGH (ref 3.5–5.1)
Sodium: 131 mmol/L — ABNORMAL LOW (ref 135–145)
Total Bilirubin: 0.3 mg/dL (ref 0.3–1.2)
Total Protein: 7.1 g/dL (ref 6.5–8.1)

## 2019-03-02 LAB — CBC WITH DIFFERENTIAL/PLATELET
Abs Immature Granulocytes: 0.06 10*3/uL (ref 0.00–0.07)
Basophils Absolute: 0.1 10*3/uL (ref 0.0–0.1)
Basophils Relative: 1 %
Eosinophils Absolute: 0.2 10*3/uL (ref 0.0–0.5)
Eosinophils Relative: 3 %
HCT: 39.5 % (ref 36.0–46.0)
Hemoglobin: 13 g/dL (ref 12.0–15.0)
Immature Granulocytes: 1 %
Lymphocytes Relative: 27 %
Lymphs Abs: 1.8 10*3/uL (ref 0.7–4.0)
MCH: 30.1 pg (ref 26.0–34.0)
MCHC: 32.9 g/dL (ref 30.0–36.0)
MCV: 91.4 fL (ref 80.0–100.0)
Monocytes Absolute: 0.5 10*3/uL (ref 0.1–1.0)
Monocytes Relative: 8 %
Neutro Abs: 3.9 10*3/uL (ref 1.7–7.7)
Neutrophils Relative %: 60 %
Platelets: 261 10*3/uL (ref 150–400)
RBC: 4.32 MIL/uL (ref 3.87–5.11)
RDW: 12.9 % (ref 11.5–15.5)
WBC: 6.5 10*3/uL (ref 4.0–10.5)
nRBC: 0 % (ref 0.0–0.2)

## 2019-03-02 LAB — LACTIC ACID, PLASMA
Lactic Acid, Venous: 3.1 mmol/L (ref 0.5–1.9)
Lactic Acid, Venous: 2.2 mmol/L (ref 0.5–1.9)

## 2019-03-02 LAB — SARS CORONAVIRUS 2 (TAT 6-24 HRS): SARS Coronavirus 2: NEGATIVE

## 2019-03-02 LAB — LIPASE, BLOOD: Lipase: 46 U/L (ref 11–51)

## 2019-03-02 LAB — POTASSIUM: Potassium: 5.2 mmol/L — ABNORMAL HIGH (ref 3.5–5.1)

## 2019-03-02 LAB — CK: Total CK: 35 U/L — ABNORMAL LOW (ref 38–234)

## 2019-03-02 LAB — ACETAMINOPHEN LEVEL: Acetaminophen (Tylenol), Serum: <10 ug/mL — ABNORMAL LOW (ref 10–30)

## 2019-03-02 LAB — PROTIME-INR
INR: 0.9 (ref 0.8–1.2)
Prothrombin Time: 12.3 seconds (ref 11.4–15.2)

## 2019-03-02 LAB — TSH: TSH: 1.258 u[IU]/mL (ref 0.350–4.500)

## 2019-03-02 LAB — APTT: aPTT: 35 seconds (ref 24–36)

## 2019-03-02 MED ORDER — SODIUM CHLORIDE 0.9 % IV BOLUS
1000.0000 mL | Freq: Once | INTRAVENOUS | Status: AC
  Administered 2019-03-02: 1000 mL via INTRAVENOUS

## 2019-03-02 MED ORDER — SODIUM CHLORIDE 0.9 % IV BOLUS
500.0000 mL | Freq: Once | INTRAVENOUS | Status: AC
  Administered 2019-03-02: 500 mL via INTRAVENOUS

## 2019-03-02 NOTE — ED Notes (Signed)
Pt unable to provide urine sample at this time 

## 2019-03-02 NOTE — ED Notes (Signed)
[facility]tar arrived to transport pt to home.

## 2019-03-02 NOTE — ED Notes (Signed)
Date and time results received: 03/02/19 1815   Test: lactic acid Critical Value: 2.2 Name of Provider Notified: Phylliss Bob PA  Orders Received? Or Actions Taken?: no orders given

## 2019-03-02 NOTE — ED Triage Notes (Signed)
Per EMS Pt from home, headache x 1 day, Hx migraine , st doesn't feel like a migraine episode, denies nausea nor vision changes. Hx Anorexia, CBG 271.  Presents with right upper thigh infection, antibiotic therapy x 2 days.

## 2019-03-02 NOTE — ED Notes (Signed)
Pt reports she is unable to ambulate up 2nd flight of stairs to home- discussed pt going home by PTAR- pt agreeable to plan.

## 2019-03-02 NOTE — ED Notes (Signed)
Pt unable to provide urine sample at this time- pt reports she will attempt when fluids have finished.

## 2019-03-02 NOTE — ED Provider Notes (Signed)
[facility] EMERGENCY DEPARTMENT Provider Note   CSN: [id number] Arrival date & time: 03/02/19  1351     History   Chief Complaint Chief Complaint  Patient presents with  . Headache    HPI Brandi Love is a 43 y.o. female with a complicated past medical history including CKD stage III, DM 2, OCD, bipolar, major depression with psychosis, GERD, anorexia, congenital deafness, congenital hypothyroid, self mutilating behaviors, who presents today for evaluation of multiple complaints.  She reports that since yesterday she has had a headache on the left front side of her head.  She states that she has mild associated nasal congestion.  She reports that her vision is blurry and that she feels lightheaded.  She denies any cough or sore throat.  She states that she has been short of breath during this time.  She denies any leg swelling.  She reports generalized body aches.  She reports generalized abdominal pain and nausea without vomiting.  She denies any recent trauma.  She reports that she has recently been on Keflex for a superficial skin infection on her right thigh.  She states that she has only been taking her medicine twice a day instead of 3 times a day due to being asleep when she is supposed to take her medicine.  She reports that she has both constipation and diarrhea, however that is her baseline.  She denies dysuria, increased frequency or urgency.  She reports body aches that are worse than her usual fibromyalgia.  She reports that she did have a neighbor who had a Covid exposure however was not having symptoms come over to her house 2 days ago.  She denies any recent self-harm however states that her wound on her right thigh she has been scratching.  She reports that it has developed a rash around it over the past 1 to 2 days.  She denies using any occlusive dressing on the area.  She reports that she last took Tylenol 500 mg at noon today.  She denies any fevers at home.   She reports that she has been taking her thyroid medication and other medications despite being nauseous.    She states that she has a history of migraines.  She reports that at first this did not feel similar to a migraine headache, however is now starting to feel similar.   All interactions with patient were performed using a professional sign language interpreter.     HPI  Past Medical History:  Diagnosis Date  . Anemia   . Anorexia   . Anxiety   . Arthritis    "hands" (09/18/2016)  . Chronic kidney disease   . Chronic lower back pain   . Chronic renal insufficiency   . Deaf   . Deafness   . Depression   . Diabetes ([place])    "borderline"  . Eating disorder   . Endometriosis   . GERD (gastroesophageal reflux disease)   . History of kidney stones   . Hyperlipidemia   . 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 Migraine    "frequency varies" (09/18/2016)  . OCD (obsessive compulsive disorder)   . Pneumonia 2011  . PONV (postoperative nausea and vomiting)   . Pulmonary embolism ([place]) 10/2001  . Seizures ([place]) 09/2001 X 1   "day after my hysterectomy"  . Seizures ([place])   . Tachycardia     Patient Active Problem List   Diagnosis Date Noted  . Deaf  10/01/2018  . Hematochezia 10/01/2018  . Hypertension 10/01/2018  . Loss of appetite 10/01/2018  . Mental health problem 10/01/2018  . Seizures ([place]) 10/01/2018  . Abdominal pain 07/02/2018  . Gastroesophageal reflux disease without esophagitis 02/16/2018  . Primary insomnia 02/16/2018  . Prediabetes 01/28/2018  . Type 2 diabetes mellitus without complication, without long-term current use of insulin ([place]) 01/28/2018  . Syncope 08/26/2017  . Acute renal failure superimposed on stage 3 chronic kidney disease ([place]) 08/26/2017  . Orthostatic syncope   . Self mutilating behavior 08/08/2017  . Acne 06/11/2017  . Self-care deficit for bathing 06/11/2017  . Urinary tract infection with hematuria   . OCD  (obsessive compulsive disorder) 09/29/2016  . Anorexia nervosa with bulimia 09/29/2016  . Deafness congenital 09/29/2016  . Intractable nausea and vomiting 09/29/2016  . Bloody diarrhea 09/29/2016  . AKI (acute kidney injury) ([place]) 09/29/2016  . Severe dehydration 09/29/2016  . Severe protein-calorie malnutrition ([place]) 09/29/2016  . Congenital hypothyroidism 09/29/2016  . Hypertriglyceridemia 09/29/2016  . Anemia 09/29/2016  . Endometriosis 09/29/2016  . MDD (major depressive disorder) 09/29/2016  . Hyperlipidemia 09/29/2016  . CKD (chronic kidney disease), stage III ([place]) 09/29/2016  . Acute kidney injury ([place]) 09/29/2016  . OAB (overactive bladder) 09/29/2016  . Sepsis ([place]) 09/18/2016  . Acute pyelonephritis 09/18/2016  . Urinary incontinence 08/09/2015  . MDD (major depressive disorder), recurrent, severe, with psychosis ([place]) 12/11/2014  . Severe recurrent major depressive disorder with psychotic symptoms ([place]) 12/11/2014  . Migraine 07/02/2013  . Eating disorder, unspecified 11/23/2012  . Hypothyroidism 11/23/2012  . Dyslipidemia 11/23/2012  . Sinus tachycardia 11/23/2012  . CKD (chronic kidney disease) stage 3, GFR 30-59 ml/min 11/18/2012  . Protein-calorie malnutrition, severe ([place]) 11/13/2012  . Abdominal pain, epigastric 11/12/2012  . Dehydration 11/12/2012  . Acute on chronic renal failure ([place]) 11/12/2012  . Leukocytosis, unspecified 11/12/2012  . Hyponatremia 11/12/2012  . Borderline personality disorder ([place]) 11/12/2012    Past Surgical History:  Procedure Laterality Date  . ABDOMINAL HYSTERECTOMY  09/2001   'except for right ovary'  . BREAST LUMPECTOMY Left 2000   benign  . BREAST LUMPECTOMY Right 2002   benign  . COCHLEAR IMPLANT Bilateral 2004-2010   "right-left"  . COLONOSCOPY  06/2015    Dr Rolm Bookbinder at [facility] for rectal bleeding: internal hemorrhoids, small anal fissure.    . ESOPHAGOGASTRODUODENOSCOPY N/A 10/07/2016   Procedure:  ESOPHAGOGASTRODUODENOSCOPY (EGD);  Surgeon: Ladene Artist, MD;  Location: [facility] ENDOSCOPY;  Service: Endoscopy;  Laterality: N/A;  . EYE MUSCLE SURGERY Left 1980   "lazy eye"  . EYE MUSCLE SURGERY Right 1990   "lazy eye"  . LAPAROSCOPIC ABDOMINAL EXPLORATION  2001 and 2002   for endometriosis     OB History   No obstetric history on file.      Home Medications    Prior to Admission medications   Medication Sig Start Date End Date Taking? Authorizing Provider  acetaminophen (TYLENOL) 500 MG tablet Take 1,000-3,000 mg by mouth See admin instructions. Take 2 tablets in the morning, at lunch and take 6 tablets at bedtime    [provider]  aspirin EC 81 MG tablet Take 81 mg by mouth daily.    [provider]  cyclobenzaprine (FLEXERIL) 5 MG tablet Take by mouth 3 (three) times daily as needed.  06/30/18   [provider]  docusate sodium (COLACE) 100 MG capsule Take 2 capsules at bedtime 07/14/18   [provider]  fluticasone (FLONASE) 50  MCG/ACT nasal spray Place 2 sprays into both nostrils 2 (two) times daily as needed.  05/03/17   [provider]  gemfibrozil (LOPID) 600 MG tablet TAKE 1 TABLET BY MOUTH IN THE MORNING. TAKE 1/2 TABLET IN THE EVENING 09/09/17   [provider]  lamoTRIgine (LAMICTAL) 150 MG tablet Take 150 mg by mouth daily.     [provider]  levothyroxine (SYNTHROID) 137 MCG tablet Take 137 mcg by mouth daily.  07/17/17   [provider]  lidocaine (LIDODERM) 5 % Place onto the skin. 08/07/18   [provider]  lurasidone (LATUDA) 80 MG TABS tablet Take 80 mg by mouth daily with breakfast.    [provider]  magnesium oxide (MAG-OX) 400 (241.3 Mg) MG tablet Take by mouth 2 (two) times a day. 11/06/17   [provider]  Melatonin 5 MG TABS Take 3 mg by mouth at bedtime.    [provider]  Multiple Vitamin (MULTIVITAMIN WITH MINERALS) TABS Take 1 tablet by mouth  daily.    [provider]  Multiple Vitamins-Minerals (TAB-A-VITE) TABS Take by mouth. 11/06/17   [provider]  ondansetron (ZOFRAN-ODT) 8 MG disintegrating tablet Take by mouth. 06/24/18   [provider]  pantoprazole (PROTONIX) 40 MG tablet Take 40 mg by mouth daily.    [provider]  pregabalin (LYRICA) 50 MG capsule Take 50 mg by mouth 2 (two) times daily.     [provider]  QUEtiapine (SEROQUEL) 25 MG tablet Take 12.5 mg by mouth at bedtime.    [provider]  topiramate (TOPAMAX) 50 MG tablet Take 50 mg by mouth 2 (two) times daily.     [provider]  traZODone (DESYREL) 50 MG tablet Take by mouth. 1.5 tabs bedtime 04/27/18   [provider]  venlafaxine XR (EFFEXOR-XR) 75 MG 24 hr capsule Take by mouth. 3 daily 08/13/17   [provider]  Vitamin D, Ergocalciferol, (DRISDOL) 50000 UNITS CAPS Take 50,000 Units by mouth every 7 (seven) days. On Sunday    [provider]    Family History Family History  Problem Relation Age of Onset  . Cancer Father   . Hyperlipidemia Father   . Osteoarthritis Mother   . Hyperlipidemia Mother     Social History Social History   Tobacco Use  . Smoking status: Never Smoker  . Smokeless tobacco: Never Used  Substance Use Topics  . Alcohol use: Never    Frequency: Never  . Drug use: Never     Allergies   Iodinated diagnostic agents, Melatonin, Phenylephrine-guaifenesin, Propoxyphene, Abilify [aripiprazole], Dhea [nutritional supplements], Dihydroergotamine, Divalproex sodium, Doxycycline, Erythromycin, Fish oil, Indomethacin, Lactose intolerance (gi), Melatonin, Risperidone and related, Shellfish allergy, Statins, Tape, Dihydroergotamine, Lactose intolerance (gi), Nsaids, and Tape   Review of Systems Review of Systems  Constitutional: Positive for appetite change and fatigue. Negative for chills and fever.  HENT: Positive for congestion, sinus  pressure and sinus pain. Negative for ear discharge, ear pain, sore throat, trouble swallowing and voice change.   Eyes: Positive for visual disturbance. Negative for photophobia, pain and discharge.  Respiratory: Positive for shortness of breath. Negative for cough.   Cardiovascular: Negative for chest pain, palpitations and leg swelling.  Gastrointestinal: Positive for abdominal pain, constipation, diarrhea, nausea and vomiting.  Genitourinary: Negative for dysuria, pelvic pain, vaginal bleeding and vaginal discharge.  Musculoskeletal: Positive for arthralgias and myalgias. Negative for back pain and neck pain.  Skin: Positive for wound.  Neurological: Positive  for weakness (Diffuse, all over), light-headedness and headaches. Negative for numbness.  Psychiatric/Behavioral: Negative for confusion.  All other systems reviewed and are negative.    Physical Exam Updated Vital Signs BP 118/83   Pulse 95   Temp 98.5 F (36.9 C) (Oral)   Resp 11   LMP  (LMP Unknown)   SpO2 97%   Physical Exam Vitals signs and nursing note reviewed.  Constitutional:      General: She is not in acute distress.    Appearance: She is well-developed.  HENT:     Head: Normocephalic and atraumatic.     Comments: Cochlear implants present bilaterally.    Mouth/Throat:     Mouth: Mucous membranes are moist.     Pharynx: Oropharynx is clear.  Eyes:     General: No visual field deficit.    Extraocular Movements: Extraocular movements intact.     Right eye: Normal extraocular motion and no nystagmus.     Left eye: Normal extraocular motion and no nystagmus.     Conjunctiva/sclera: Conjunctivae normal.     Pupils: Pupils are equal, round, and reactive to light.  Neck:     Musculoskeletal: Normal range of motion and neck supple.  Cardiovascular:     Rate and Rhythm: Normal rate and regular rhythm.     Heart sounds: Murmur present.     Comments: 2+ DP, PT and radial pulses bilaterally. Pulmonary:      Effort: Pulmonary effort is normal. No respiratory distress.     Breath sounds: Normal breath sounds. No rhonchi.  Abdominal:     General: Bowel sounds are normal.     Palpations: Abdomen is soft.     Tenderness: There is abdominal tenderness (Bilateral upper abdomen and epigastric).  Musculoskeletal:     Comments: Diffuse tenderness to palpation through bilateral arms and legs without crepitus, edema, deformity noted.  Compartments are soft and easily compressible.  Lymphadenopathy:     Cervical: No cervical adenopathy.  Skin:    General: Skin is warm and dry.  Neurological:     Mental Status: She is alert and oriented to person, place, and time.     GCS: GCS eye subscore is 4. GCS verbal subscore is 5. GCS motor subscore is 6.     Cranial Nerves: No cranial nerve deficit or facial asymmetry.     Motor: Weakness (5/5 strength bilateral upper and lower extremities.) present.     Comments: GCS 15 when allowing for use of ASL in place of verbal response.  Psychiatric:        Mood and Affect: Mood normal.        Behavior: Behavior normal.        ED Treatments / Results  Labs (all labs ordered are listed, but only abnormal results are displayed) Labs Reviewed  COMPREHENSIVE METABOLIC PANEL - Abnormal; Notable for the following components:      Result Value   Sodium 131 (*)    Potassium 5.9 (*)    CO2 17 (*)    Glucose, Bld 275 (*)    Creatinine, Ser 1.45 (*)    GFR calc non Af Amer 45 (*)    GFR calc Af Amer 52 (*)    All other components within normal limits  ACETAMINOPHEN LEVEL - Abnormal; Notable for the following components:   Acetaminophen (Tylenol), Serum <10 (*)    All other components within normal limits  LACTIC ACID, PLASMA - Abnormal; Notable for the following components:   Lactic Acid,  Venous 3.1 (*)    All other components within normal limits  LACTIC ACID, PLASMA - Abnormal; Notable for the following components:   Lactic Acid, Venous 2.2 (*)    All other  components within normal limits  POTASSIUM - Abnormal; Notable for the following components:   Potassium 5.2 (*)    All other components within normal limits  CK - Abnormal; Notable for the following components:   Total CK 35 (*)    All other components within normal limits  SARS CORONAVIRUS 2 (TAT 6-24 HRS)  CULTURE, BLOOD (ROUTINE X 2)  CULTURE, BLOOD (ROUTINE X 2)  LIPASE, BLOOD  CBC WITH DIFFERENTIAL/PLATELET  URINALYSIS, ROUTINE W REFLEX MICROSCOPIC  TSH  PROTIME-INR  APTT  MAGNESIUM    EKG EKG Interpretation  Date/Time:  Tuesday March 02 2019 15:39:12 EST Ventricular Rate:  96 PR Interval:    QRS Duration: 80 QT Interval:  340 QTC Calculation: 430 R Axis:   45 Text Interpretation: Sinus rhythm Low voltage, precordial leads Confirmed by Gerlene Fee [phone]) on 03/02/2019 4:17:32 PM   Radiology Dg Chest Port 1 View  Result Date: 03/02/2019 CLINICAL DATA:  Shortness of breath. EXAM: PORTABLE CHEST 1 VIEW COMPARISON:  06/23/2018 FINDINGS: The heart size and mediastinal contours are within normal limits. Both lungs are clear. The visualized skeletal structures are unremarkable. IMPRESSION: Normal exam. Electronically Signed   By: Lorriane Shire M.D.   On: 03/02/2019 15:33    Procedures Procedures (including critical care time)  Medications Ordered in ED Medications  sodium chloride 0.9 % bolus 500 mL (0 mLs Intravenous Stopped 03/02/19 1605)  sodium chloride 0.9 % bolus 1,000 mL (0 mLs Intravenous Stopped 03/02/19 1725)  sodium chloride 0.9 % bolus 1,000 mL (0 mLs Intravenous Stopped 03/02/19 1837)     Initial Impression / Assessment and Plan / ED Course  I have reviewed the triage vital signs and the nursing notes.  Pertinent labs & imaging results that were available during my care of the patient were reviewed by me and considered in my medical decision making (see chart for details).  Clinical Course as of Mar 01 2316  Tue Mar 02, 2019  1721 Initially  was 5.9, has decreased with hydration.  No EKG changes, Aggressive treatment not indicated at this time.   Potassium(!): 5.2 [EH]  1722 CK Total(!): 35 [EH]  1822 Improved with fluids.   Lactic Acid, Venous(!!): 2.2 [EH]    Clinical Course User Index [EH] Lorin Glass, PA-C      Patient presents today for evaluation of multiple complaints including headache, nausea, myalgias, arthralgias, diffuse abdominal pain, fatigue, decreased p.o. intake, headache, and shortness of breath.  She does have a possible coronavirus exposure as she reports her neighbor was exposed and then was around her recently.  Labs are obtained and reviewed, initial lactic acid is elevated at 3.1.  After fluids that reduced to 2.2.  CBC shows baseline renal function with hyperglycemia.  No anion gap.  CBC is unremarkable.  Acetaminophen level is undetected.  PTT and PT/INR are normal.  TSH is normal.  Magnesium is not elevated.  CK is not elevated.  Her initial potassium was 5.9.  On recheck after fluids it was down to 5.2.  EKG does not show changes from hyper kalemia.  She denies taking any potassium supplements.  I suspect this is elevated due to dehydration.  Patient was not consistently tachycardic or tachypneic and is afebrile here.  She does not meet SIRS criteria.  Chest x-ray obtained without evidence of acute abnormalities.  Suspect viral process such as COVID or flu.    She felt better after IV fluids.  PERC negative.  Blood cultures were obtained given her slight skin infection, however I do not feel that this requires additional antibiotics or treatment at this time.  Recommended continued p.o. hydration at home along with conservative care and PCP follow-up.  Return precautions were discussed with patient who states their understanding.  At the time of discharge patient denied any unaddressed complaints or concerns.  Patient is agreeable for discharge home.    Brandi Love was evaluated in Emergency  Department on 03/02/2019 for the symptoms described in the history of present illness. She was evaluated in the context of the global COVID-19 pandemic, which necessitated consideration that the patient might be at risk for infection with the SARS-CoV-2 virus that causes COVID-19. Institutional protocols and algorithms that pertain to the evaluation of patients at risk for COVID-19 are in a state of rapid change based on information released by regulatory bodies including the CDC and federal and state organizations. These policies and algorithms were followed during the patient's care in the ED.  Final Clinical Impressions(s) / ED Diagnoses   Final diagnoses:  Suspected COVID-19 virus infection  Acute nonintractable headache, unspecified headache type  Cough    ED Discharge Orders    None       Lorin Glass, PA-C 03/02/19 2318    Maudie Flakes, MD 03/07/19 1455

## 2019-03-02 NOTE — ED Notes (Signed)
Date and time results received: 03/02/19 1610  Test: lactic acid Critical Value: 3.1  Name of Provider Notified: Phylliss Bob PA Orders Received? Or Actions Taken?: no orders given

## 2019-03-02 NOTE — ED Notes (Signed)
Pt awaiting PTAR °

## 2019-03-02 NOTE — Discharge Instructions (Addendum)
As we discussed today your symptoms can all be explained by coronavirus infection.  I would recommend that you quarantine yourself at home for 10 days even if your test returns negative.  If your symptoms worsen or you have additional concerns please go to either [facility] emergency room or [facility] long emergency room.    Please take Tylenol (acetaminophen) to relieve your pain.  You may take tylenol, up to 1,000 mg (two extra strength pills).  Do not take more than 3,000 mg tylenol in a 24 hour period.  Please check all medication labels as many medications such as pain and cold medications may contain tylenol. Please do not drink alcohol while taking this medication.

## 2019-03-02 NOTE — ED Notes (Signed)
Pt assisted to bedside commode x1 assist.

## 2019-03-02 NOTE — ED Notes (Signed)
2nd set of cultures obtained from L forearm at 1635

## 2019-03-03 ENCOUNTER — Emergency Department (HOSPITAL_COMMUNITY)
Admission: EM | Admit: 2019-03-03 | Discharge: 2019-03-04 | Disposition: A | Discharge status: Home / Self Care | Payer: Medicare Other | Attending: Emergency Medicine | Admitting: Emergency Medicine

## 2019-03-03 ENCOUNTER — Emergency Department (HOSPITAL_COMMUNITY): Payer: Medicare Other

## 2019-03-03 ENCOUNTER — Other Ambulatory Visit: Payer: Self-pay

## 2019-03-03 DIAGNOSIS — R10817 Generalized abdominal tenderness: Secondary | ICD-10-CM | POA: Diagnosis not present

## 2019-03-03 DIAGNOSIS — R110 Nausea: Secondary | ICD-10-CM | POA: Insufficient documentation

## 2019-03-03 DIAGNOSIS — F429 Obsessive-compulsive disorder, unspecified: Secondary | ICD-10-CM | POA: Insufficient documentation

## 2019-03-03 DIAGNOSIS — Z86711 Personal history of pulmonary embolism: Secondary | ICD-10-CM | POA: Diagnosis not present

## 2019-03-03 DIAGNOSIS — E039 Hypothyroidism, unspecified: Secondary | ICD-10-CM | POA: Insufficient documentation

## 2019-03-03 DIAGNOSIS — Z7982 Long term (current) use of aspirin: Secondary | ICD-10-CM | POA: Insufficient documentation

## 2019-03-03 DIAGNOSIS — R0602 Shortness of breath: Secondary | ICD-10-CM | POA: Insufficient documentation

## 2019-03-03 DIAGNOSIS — N183 Chronic kidney disease, stage 3 unspecified: Secondary | ICD-10-CM | POA: Insufficient documentation

## 2019-03-03 DIAGNOSIS — I129 Hypertensive chronic kidney disease with stage 1 through stage 4 chronic kidney disease, or unspecified chronic kidney disease: Secondary | ICD-10-CM | POA: Insufficient documentation

## 2019-03-03 DIAGNOSIS — Z79899 Other long term (current) drug therapy: Secondary | ICD-10-CM | POA: Insufficient documentation

## 2019-03-03 DIAGNOSIS — E1122 Type 2 diabetes mellitus with diabetic chronic kidney disease: Secondary | ICD-10-CM | POA: Diagnosis not present

## 2019-03-03 DIAGNOSIS — R0789 Other chest pain: Secondary | ICD-10-CM | POA: Insufficient documentation

## 2019-03-03 LAB — CBC WITH DIFFERENTIAL/PLATELET
Abs Immature Granulocytes: 0.04 10*3/uL (ref 0.00–0.07)
Basophils Absolute: 0.1 10*3/uL (ref 0.0–0.1)
Basophils Relative: 1 %
Eosinophils Absolute: 0.2 10*3/uL (ref 0.0–0.5)
Eosinophils Relative: 3 %
HCT: 38.1 % (ref 36.0–46.0)
Hemoglobin: 12.8 g/dL (ref 12.0–15.0)
Immature Granulocytes: 1 %
Lymphocytes Relative: 37 %
Lymphs Abs: 2 10*3/uL (ref 0.7–4.0)
MCH: 30.8 pg (ref 26.0–34.0)
MCHC: 33.6 g/dL (ref 30.0–36.0)
MCV: 91.8 fL (ref 80.0–100.0)
Monocytes Absolute: 0.5 10*3/uL (ref 0.1–1.0)
Monocytes Relative: 9 %
Neutro Abs: 2.7 10*3/uL (ref 1.7–7.7)
Neutrophils Relative %: 49 %
Platelets: 288 10*3/uL (ref 150–400)
RBC: 4.15 MIL/uL (ref 3.87–5.11)
RDW: 12.9 % (ref 11.5–15.5)
WBC: 5.5 10*3/uL (ref 4.0–10.5)
nRBC: 0 % (ref 0.0–0.2)

## 2019-03-03 LAB — BASIC METABOLIC PANEL
Anion gap: 12 (ref 5–15)
BUN: 16 mg/dL (ref 6–20)
CO2: 19 mmol/L — ABNORMAL LOW (ref 22–32)
Calcium: 10 mg/dL (ref 8.9–10.3)
Chloride: 106 mmol/L (ref 98–111)
Creatinine, Ser: 1.53 mg/dL — ABNORMAL HIGH (ref 0.44–1.00)
GFR calc Af Amer: 49 mL/min — ABNORMAL LOW (ref >60)
GFR calc non Af Amer: 42 mL/min — ABNORMAL LOW (ref >60)
Glucose, Bld: 205 mg/dL — ABNORMAL HIGH (ref 70–99)
Potassium: 4.4 mmol/L (ref 3.5–5.1)
Sodium: 137 mmol/L (ref 135–145)

## 2019-03-03 LAB — TROPONIN I (HIGH SENSITIVITY)
Troponin I (High Sensitivity): 4 ng/L (ref <18)
Troponin I (High Sensitivity): 3 ng/L (ref <18)

## 2019-03-03 LAB — D-DIMER, QUANTITATIVE: D-Dimer, Quant: 0.32 ug/mL-FEU (ref 0.00–0.50)

## 2019-03-03 LAB — LACTIC ACID, PLASMA
Lactic Acid, Venous: 2.1 mmol/L (ref 0.5–1.9)
Lactic Acid, Venous: 2.4 mmol/L (ref 0.5–1.9)

## 2019-03-03 MED ORDER — SODIUM CHLORIDE 0.9 % IV BOLUS
1000.0000 mL | Freq: Once | INTRAVENOUS | Status: AC
  Administered 2019-03-03: 1000 mL via INTRAVENOUS

## 2019-03-03 MED ORDER — ONDANSETRON 4 MG PO TBDP
4.0000 mg | ORAL_TABLET | Freq: Once | ORAL | Status: DC
  Filled 2019-03-03: qty 1

## 2019-03-03 MED ORDER — TECHNETIUM TO 99M ALBUMIN AGGREGATED
1.5600 | Freq: Once | INTRAVENOUS | Status: AC | PRN
  Administered 2019-03-03: 16:00:00 1.56 via INTRAVENOUS

## 2019-03-03 NOTE — Discharge Instructions (Addendum)
All of your workup today was normal. Your scan was negative for a pulmonary embolism. If your symptoms do not improve within the next few days, call and schedule an appointment with your PCP. You may take over the counter ibuprofen or Tylenol as needed for pain. If chest pain continues, call and schedule an appointment with Dr. Einar Gip. You should receive a call within the next days day from home health to help around the house. Return to the ER for new or worsening symptoms.

## 2019-03-03 NOTE — ED Notes (Signed)
Explained to this patient the protocols for taking people home by PTAR. PT stated she was in pain. PT informed her care has ended and has been discharged so no pain meds will be given. Explained that there are no other rooms to put her in. Explained Ptars list and informed her where she is on the list. PT continued to argue with staff and repeat shes in pain and frustration of having to wait. Offered comfort measures and PT denied. PT then toold this tech "fuck you" and yelled "Bastard" from her room once back at the nurses station. Will continue to monitor.

## 2019-03-03 NOTE — ED Provider Notes (Signed)
[facility] EMERGENCY DEPARTMENT Provider Note   CSN: [id number] Arrival date & time: 03/03/19  0759     History   Chief Complaint Chief Complaint  Patient presents with  . Shortness of Breath    HPI Brandi Love is a 43 y.o. female with an extensive past medical history most significant for CKD stage III, DM 2, OCD, bipolar disorder, major depression with psychosis, GERD, congenital deafness, congenital hypothyroidism, hx PE, and self mutilating behaviors who presents to the ED via EMS due to sudden onset of severe shortness of breath x 2 days. Patient notes shortness of breath is worse when lying down. Shortness of breath is associated with central, substernal chest pain with numbness/tingling of left arm. Patient describes chest pain as a "elephant on her chest". Patient was seen in the ED yesterday for constitutional symptoms and headache with unremarkable workup. Patient denies recent surgeries, long travel, hormonal therapy. Patient takes ASA 81mg  daily, but no other anticoagulants. Patient also notes wound on right thigh which was previously treated with Keflex. Patient denies headache, weakness, and lower extremity edema.  Sign language translator used throughout encounter  Past Medical History:  Diagnosis Date  . Anemia   . Anorexia   . Anxiety   . Arthritis    "hands" (09/18/2016)  . Chronic kidney disease   . Chronic lower back pain   . Chronic renal insufficiency   . Deaf   . Deafness   . Depression   . Diabetes ([place])    "borderline"  . Eating disorder   . Endometriosis   . GERD (gastroesophageal reflux disease)   . History of kidney stones   . Hyperlipidemia   . 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 Migraine    "frequency varies" (09/18/2016)  . OCD (obsessive compulsive disorder)   . Pneumonia 2011  . PONV (postoperative nausea and vomiting)   . Pulmonary embolism ([place]) 10/2001  . Seizures ([place]) 09/2001 X 1   "day after my  hysterectomy"  . Seizures ([place])   . Tachycardia     Patient Active Problem List   Diagnosis Date Noted  . Deaf 10/01/2018  . Hematochezia 10/01/2018  . Hypertension 10/01/2018  . Loss of appetite 10/01/2018  . Mental health problem 10/01/2018  . Seizures ([place]) 10/01/2018  . Abdominal pain 07/02/2018  . Gastroesophageal reflux disease without esophagitis 02/16/2018  . Primary insomnia 02/16/2018  . Prediabetes 01/28/2018  . Type 2 diabetes mellitus without complication, without long-term current use of insulin ([place]) 01/28/2018  . Syncope 08/26/2017  . Acute renal failure superimposed on stage 3 chronic kidney disease ([place]) 08/26/2017  . Orthostatic syncope   . Self mutilating behavior 08/08/2017  . Acne 06/11/2017  . Self-care deficit for bathing 06/11/2017  . Urinary tract infection with hematuria   . OCD (obsessive compulsive disorder) 09/29/2016  . Anorexia nervosa with bulimia 09/29/2016  . Deafness congenital 09/29/2016  . Intractable nausea and vomiting 09/29/2016  . Bloody diarrhea 09/29/2016  . AKI (acute kidney injury) ([place]) 09/29/2016  . Severe dehydration 09/29/2016  . Severe protein-calorie malnutrition ([place]) 09/29/2016  . Congenital hypothyroidism 09/29/2016  . Hypertriglyceridemia 09/29/2016  . Anemia 09/29/2016  . Endometriosis 09/29/2016  . MDD (major depressive disorder) 09/29/2016  . Hyperlipidemia 09/29/2016  . CKD (chronic kidney disease), stage III ([place]) 09/29/2016  . Acute kidney injury ([place]) 09/29/2016  . OAB (overactive bladder) 09/29/2016  . Sepsis ([place]) 09/18/2016  . Acute pyelonephritis 09/18/2016  . Urinary  incontinence 08/09/2015  . MDD (major depressive disorder), recurrent, severe, with psychosis ([place]) 12/11/2014  . Severe recurrent major depressive disorder with psychotic symptoms ([place]) 12/11/2014  . Migraine 07/02/2013  . Eating disorder, unspecified 11/23/2012  . Hypothyroidism 11/23/2012  . Dyslipidemia 11/23/2012  . Sinus  tachycardia 11/23/2012  . CKD (chronic kidney disease) stage 3, GFR 30-59 ml/min 11/18/2012  . Protein-calorie malnutrition, severe ([place]) 11/13/2012  . Abdominal pain, epigastric 11/12/2012  . Dehydration 11/12/2012  . Acute on chronic renal failure ([place]) 11/12/2012  . Leukocytosis, unspecified 11/12/2012  . Hyponatremia 11/12/2012  . Borderline personality disorder ([place]) 11/12/2012    Past Surgical History:  Procedure Laterality Date  . ABDOMINAL HYSTERECTOMY  09/2001   'except for right ovary'  . BREAST LUMPECTOMY Left 2000   benign  . BREAST LUMPECTOMY Right 2002   benign  . COCHLEAR IMPLANT Bilateral 2004-2010   "right-left"  . COLONOSCOPY  06/2015    Dr Rolm Bookbinder at [facility] for rectal bleeding: internal hemorrhoids, small anal fissure.    . ESOPHAGOGASTRODUODENOSCOPY N/A 10/07/2016   Procedure: ESOPHAGOGASTRODUODENOSCOPY (EGD);  Surgeon: Ladene Artist, MD;  Location: [facility] ENDOSCOPY;  Service: Endoscopy;  Laterality: N/A;  . EYE MUSCLE SURGERY Left 1980   "lazy eye"  . EYE MUSCLE SURGERY Right 1990   "lazy eye"  . LAPAROSCOPIC ABDOMINAL EXPLORATION  2001 and 2002   for endometriosis     OB History   No obstetric history on file.      Home Medications    Prior to Admission medications   Medication Sig Start Date End Date Taking? Authorizing Provider  acetaminophen (TYLENOL) 500 MG tablet Take 1,000-3,000 mg by mouth See admin instructions. Take 2 tablets in the morning, at lunch and take 6 tablets at bedtime    [provider]  aspirin EC 81 MG tablet Take 81 mg by mouth daily.    [provider]  cyclobenzaprine (FLEXERIL) 5 MG tablet Take by mouth 3 (three) times daily as needed.  06/30/18   [provider]  docusate sodium (COLACE) 100 MG capsule Take 2 capsules at bedtime 07/14/18   [provider]  fluticasone (FLONASE) 50 MCG/ACT nasal spray Place 2 sprays into both nostrils 2 (two) times daily as needed.  05/03/17    [provider]  gemfibrozil (LOPID) 600 MG tablet TAKE 1 TABLET BY MOUTH IN THE MORNING. TAKE 1/2 TABLET IN THE EVENING 09/09/17   [provider]  lamoTRIgine (LAMICTAL) 150 MG tablet Take 150 mg by mouth daily.     [provider]  levothyroxine (SYNTHROID) 137 MCG tablet Take 137 mcg by mouth daily.  07/17/17   [provider]  lidocaine (LIDODERM) 5 % Place onto the skin. 08/07/18   [provider]  lurasidone (LATUDA) 80 MG TABS tablet Take 80 mg by mouth daily with breakfast.    [provider]  magnesium oxide (MAG-OX) 400 (241.3 Mg) MG tablet Take by mouth 2 (two) times a day. 11/06/17   [provider]  Melatonin 5 MG TABS Take 3 mg by mouth at bedtime.    [provider]  Multiple Vitamin (MULTIVITAMIN WITH MINERALS) TABS Take 1 tablet by mouth daily.    [provider]  Multiple Vitamins-Minerals (TAB-A-VITE) TABS Take by mouth. 11/06/17   [provider]  ondansetron (ZOFRAN-ODT) 8 MG disintegrating tablet Take by mouth. 06/24/18   [provider]  pantoprazole (PROTONIX) 40 MG tablet Take 40 mg by mouth daily.  [provider]  pregabalin (LYRICA) 50 MG capsule Take 50 mg by mouth 2 (two) times daily.     [provider]  QUEtiapine (SEROQUEL) 25 MG tablet Take 12.5 mg by mouth at bedtime.    [provider]  topiramate (TOPAMAX) 50 MG tablet Take 50 mg by mouth 2 (two) times daily.     [provider]  traZODone (DESYREL) 50 MG tablet Take by mouth. 1.5 tabs bedtime 04/27/18   [provider]  venlafaxine XR (EFFEXOR-XR) 75 MG 24 hr capsule Take by mouth. 3 daily 08/13/17   [provider]  Vitamin D, Ergocalciferol, (DRISDOL) 50000 UNITS CAPS Take 50,000 Units by mouth every 7 (seven) days. On Sunday    [provider]    Family History Family History  Problem Relation Age of Onset  . Cancer Father   . Hyperlipidemia Father    . Osteoarthritis Mother   . Hyperlipidemia Mother     Social History Social History   Tobacco Use  . Smoking status: Never Smoker  . Smokeless tobacco: Never Used  Substance Use Topics  . Alcohol use: Never    Frequency: Never  . Drug use: Never     Allergies   Iodinated diagnostic agents, Melatonin, Phenylephrine-guaifenesin, Propoxyphene, Abilify [aripiprazole], Dhea [nutritional supplements], Dihydroergotamine, Divalproex sodium, Doxycycline, Erythromycin, Fish oil, Indomethacin, Lactose intolerance (gi), Melatonin, Risperidone and related, Shellfish allergy, Statins, Tape, Dihydroergotamine, Lactose intolerance (gi), Nsaids, and Tape   Review of Systems Review of Systems  Constitutional: Positive for chills. Negative for fever.  Respiratory: Positive for shortness of breath.   Cardiovascular: Positive for chest pain.  Gastrointestinal: Positive for abdominal pain and nausea. Negative for diarrhea and vomiting.  Skin: Positive for wound.     Physical Exam Updated Vital Signs BP (!) 138/97 (BP Location: Right Arm)   Pulse (!) 108   Temp 98.3 F (36.8 C) (Oral)   Resp 16   LMP  (LMP Unknown)   SpO2 97%   Physical Exam Vitals signs and nursing note reviewed.  Constitutional:      General: She is not in acute distress.    Appearance: She is not ill-appearing or toxic-appearing.     Comments: Tearful on exam  HENT:     Head: Normocephalic.     Comments: Cochlear implants in place bilaterally Eyes:     Pupils: Pupils are equal, round, and reactive to light.  Neck:     Musculoskeletal: Normal range of motion and neck supple.  Cardiovascular:     Rate and Rhythm: Regular rhythm. Tachycardia present.     Pulses: Normal pulses.     Heart sounds: Murmur present. No friction rub. No gallop.   Pulmonary:     Effort: Pulmonary effort is normal.     Breath sounds: Normal breath sounds.     Comments: Clear to auscultation bilaterally Chest:     Comments:  Reproducible tenderness over sternum. No crepitus. No deformity.  Abdominal:     General: Abdomen is flat. Bowel sounds are normal. There is no distension.     Palpations: Abdomen is soft.     Comments: Diffuse tenderness to palpation. No focal tenderness. No tenderness at McBurney's point. No rebound. No guarding.  Musculoskeletal: Normal range of motion.     Right lower leg: No edema.     Left lower leg: No edema.  Skin:    General: Skin is warm and dry.  Neurological:     General: No focal deficit present.  Mental Status: She is alert.      ED Treatments / Results  Labs (all labs ordered are listed, but only abnormal results are displayed) Labs Reviewed  BASIC METABOLIC PANEL - Abnormal; Notable for the following components:      Result Value   CO2 19 (*)    Glucose, Bld 205 (*)    Creatinine, Ser 1.53 (*)    GFR calc non Af Amer 42 (*)    GFR calc Af Amer 49 (*)    All other components within normal limits  LACTIC ACID, PLASMA - Abnormal; Notable for the following components:   Lactic Acid, Venous 2.1 (*)    All other components within normal limits  LACTIC ACID, PLASMA - Abnormal; Notable for the following components:   Lactic Acid, Venous 2.4 (*)    All other components within normal limits  CBC WITH DIFFERENTIAL/PLATELET  D-DIMER, QUANTITATIVE (NOT AT [facility])  TROPONIN I (HIGH SENSITIVITY)  TROPONIN I (HIGH SENSITIVITY)    EKG EKG Interpretation  Date/Time:  Wednesday March 03 2019 13:03:30 EST Ventricular Rate:  109 PR Interval:  162 QRS Duration: 68 QT Interval:  318 QTC Calculation: 428 R Axis:   50 Text Interpretation: Sinus tachycardia Nonspecific T wave abnormality Abnormal ECG No significant change since last tracing Confirmed by Deno Etienne [phone]) on 03/03/2019 5:47:16 PM   Radiology Nm Pulmonary Perfusion  Result Date: 03/03/2019 CLINICAL DATA:  Pretest probability high for pulmonary embolism. EXAM: NUCLEAR MEDICINE PERFUSION LUNG SCAN  TECHNIQUE: Perfusion images were obtained in multiple projections after intravenous injection of radiopharmaceutical. Ventilation scans intentionally deferred if perfusion scan and chest x-ray adequate for interpretation during COVID 19 epidemic. RADIOPHARMACEUTICALS:  1.56 mCi Tc-99m MAA IV COMPARISON:  Chest x-ray same date. FINDINGS: Normal perfusion exhibited on all views. IMPRESSION: No signs of perfusion defect to suggest pulmonary embolism. Electronically Signed   By: Zetta Bills M.D.   On: 03/03/2019 17:15   Dg Chest Portable 1 View  Result Date: 03/03/2019 CLINICAL DATA:  Shortness of breath EXAM: PORTABLE CHEST 1 VIEW COMPARISON:  03/02/2019 and multiple prior studies. FINDINGS: Subtle nodular density projecting adjacent to scapula in the left mid chest not seen on prior radiographs. Heart size is normal. No acute bone finding IMPRESSION: 1. Subtle nodular density projecting adjacent to the scapula in the left mid chest. May be related to infection or inflammation as this finding was not present on very recent prior studies. Consider follow-up chest x-ray to ensure resolution. No consolidation or pleural effusion on today's exam. Electronically Signed   By: Zetta Bills M.D.   On: 03/03/2019 09:43   Dg Chest Port 1 View  Result Date: 03/02/2019 CLINICAL DATA:  Shortness of breath. EXAM: PORTABLE CHEST 1 VIEW COMPARISON:  06/23/2018 FINDINGS: The heart size and mediastinal contours are within normal limits. Both lungs are clear. The visualized skeletal structures are unremarkable. IMPRESSION: Normal exam. Electronically Signed   By: Lorriane Shire M.D.   On: 03/02/2019 15:33    Procedures Procedures (including critical care time)  Medications Ordered in ED Medications  ondansetron (ZOFRAN-ODT) disintegrating tablet 4 mg (4 mg Oral Not Given 03/03/19 1529)  sodium chloride 0.9 % bolus 1,000 mL (1,000 mLs Intravenous New Bag/Given 03/03/19 1758)  technetium albumin aggregated (MAA)  injection solution Q000111Q millicurie (Q000111Q millicuries Intravenous Contrast Given 03/03/19 1610)     Initial Impression / Assessment and Plan / ED Course  I have reviewed the triage vital signs and the nursing notes.  Pertinent labs &  imaging results that were available during my care of the patient were reviewed by me and considered in my medical decision making (see chart for details).       43 year old female presents to ED via EMS with severe shortness of breath and chest pain. [place] for consitutional symptoms and headache. COVID negative. Patient has a history of PE back in 2003. She is on ASA 81mg . Patient is tachycardic at 108. Through chart review, last PCP visit on 11/4 patient was also tachycardic. Patient sees Dr. Einar Gip with normal Echo back in 2018. Patient was also tachycardic at his visit on 09/09/2018. Appears to be her baseline. Not hypoxic or tachypneic. Patient tearful on exam. Reproducible substernal tenderness on exam. Lungs clear to auscultation bilaterally. Routine labs, CXR, troponin, and EKG ordered at triage. Given patient's history of PE and severe SOB will order V/Q scan. Unable to get CTA scan due to anaphylaxis from contrast dye.  Initial troponin normal. Will get serial troponin. D-dimer not elevated. CBC reassuring with no leukocytosis. BMP with creatinine of 1.53 which appears to be slightly worse from yesterday. Lactic acid elevated at 2.4. Will give 1L IVFs. Suspect elevated lactic acid likely due to dehydration.   VQ scan reviewed and demonstrates no signs of PE. CXR personally reviewed which demonstrates a nodular density adjacent to scapula in mid chest. Spoke to Dr. Tyrone Nine about CXR who recommends outpatient follow-up and repeat CXR once patient starts to feel better. Serial troponin negative. Low suspicion for ACS at this time given normal serial troponin and EKG with no signs of ischemia and no changes from previous EKG. TSH yesterday  normal, low suspicion for thyroid etiology. Suspect issues could be related to viral etiology.  7:50 PM Discussed case with Lucia Bitter who spoke with patient. Consult to care management and home health due to difficulties ambulating and performing ADLs at home.   Advised patient to follow up with PCP within the next few days if symptoms do not improve. Strict ED precautions discussed with patient. Patient states understanding and agrees to plan. Patient discharged home in no acute distress.  Discussed case with Dr. Kathrynn Humble who agrees with assessment and plan.  Final Clinical Impressions(s) / ED Diagnoses   Final diagnoses:  Shortness of breath  Atypical chest pain    ED Discharge Orders    None       Romie Levee 03/03/19 2235    Varney Biles, MD 03/07/19 [id number]

## 2019-03-03 NOTE — ED Provider Notes (Signed)
Patient placed in Quick Look pathway, seen and evaluated   Chief Complaint: Shortness of breath.  HPI:   Level 5 caveate, no sign language interpreter in triage, will need room.  EMS brings patient in for shortness of breath and body aches. Vital signs stable seen at [facility] yesterday.  Workup yesterday: Lactic elevated at 3.1 improved to 2.2 after fluids, CMP baseline, Mag normal, CK wnl, K of 5.9 improved to 5.2, COVID Negative.  ROS: Level 5 due to deaf  Physical Exam:   Gen: No distress  Neuro: Awake and Alert  Skin: Warm    Focused Exam: LCTAB, HRRR, Abdomen soft nontender, NVI x 4, no signs of DVT.  Plan: With negative covid will expand workup to include troponin in patient with SOB, waiting on interpreter, per note yesterday patient PERC negative, no signs of DVT. VSS  Initiation of care has begun. The patient has been counseled on the process, plan, and necessity for staying for the completion/evaluation, and the remainder of the medical screening examination   Deliah Boston, PA-C 03/03/19 [id number]    Varney Biles, MD 03/03/19 1002

## 2019-03-03 NOTE — ED Notes (Signed)
Pt found on her hands and knees on floor stating she fell , no red marks on arms , elbows head or knees , states she did not hit her head, pt eval per PA, no xrays needed, pt  states she has eating disorder and has not eaten ,  and that she takes tylenol and tramdol and that she had taken them when she was in the waiting room , ALL thhis done and explained with [facility]  Deaf interpreter also explained that she is going to neuc med for VQ  Scan and that she will lay flat for 40 mins , pt states understanding via stratus

## 2019-03-03 NOTE — ED Notes (Signed)
Pt given turkey sandwich and water

## 2019-03-03 NOTE — ED Triage Notes (Signed)
PT arrives EMS SOB, seen at [facility] yesterday, discharged, had COVID test no results. Bodyaches. 97.5 T, hr 86, RR 16, 138/88. SPO2 97 % RA.

## 2019-03-03 NOTE — ED Notes (Signed)
Went to give med that was ordered a nd aND PT Herrick 25 MG , PT HAD UNDERSTOOD THRU LAST interpreter  THAT SHE  Was  Not to take ANY of her meds because we were afraid it would make her too sleepy

## 2019-03-03 NOTE — ED Notes (Signed)
PT Slammed door shut

## 2019-03-03 NOTE — ED Notes (Signed)
Pt refused to wear a mask or a faceshield in the hallway back to a ready room in 43. The patient screamed at me that I was a fucking bitch and she wasn't going to wear it. She also yelled that I was going to be arrested and called 911 from the waiting room. I explained to the patient a mask or faceshield is required while in the hallway, to be seen by a provider and until she was ready to put it on we would be putting her in the waiting room. Sign language interpreter paged and relayed the same information.

## 2019-03-03 NOTE — ED Notes (Addendum)
Sign language interpreter phone number [phone], can use ipad if needed

## 2019-03-03 NOTE — ED Notes (Signed)
CALLED PTAR TO TAKE PT HOME.-- ALEXA

## 2019-03-03 NOTE — ED Notes (Signed)
[phone] pts father Arnette Norris, please have pt call

## 2019-03-03 NOTE — ED Notes (Signed)
Called for sign language interpreter

## 2019-03-03 NOTE — ED Notes (Signed)
Lactic acid 2.1 called to this RN by Lab.

## 2019-03-03 NOTE — ED Notes (Signed)
Photographer (Sign Language Interp)called @ [employer], RN called by Levada Dy

## 2019-03-04 NOTE — ED Notes (Signed)
This RN went in for hourly rounding to check on pt. Found to be on ground in the corner of the room in the fetal position. Pt alert and in no apparent distress, no obvious injuries noted from fall. This RN asked pt what she was doing on the floor and how she got here, and pt initially refused to answer questions. Upon touching the pts arm to make sure pt was okay, pt swung her shoulder back, and said "leave me the fuck alone."

## 2019-03-04 NOTE — ED Notes (Signed)
Called to check pt status on list. Stated Brandi Love is really backed up tonight and can not give me an ETA

## 2019-03-04 NOTE — ED Notes (Signed)
Patient verbalizes understanding of discharge instructions. Opportunity for questioning and answers were provided. Armband removed by staff, pt discharged from ED via [place].

## 2019-03-07 LAB — CULTURE, BLOOD (ROUTINE X 2)
Culture: NO GROWTH
Culture: NO GROWTH
Special Requests: ADEQUATE
Special Requests: ADEQUATE

## 2019-04-18 IMAGING — DX DG ABD PORTABLE 1V
1 series · 1 of 1 positions shown · non-contrast
Comparison: Radiograph October 07, 2016.

CLINICAL DATA: Nasogastric tube placement.

EXAM:
PORTABLE ABDOMEN - 1 VIEW

[abdomen kub]
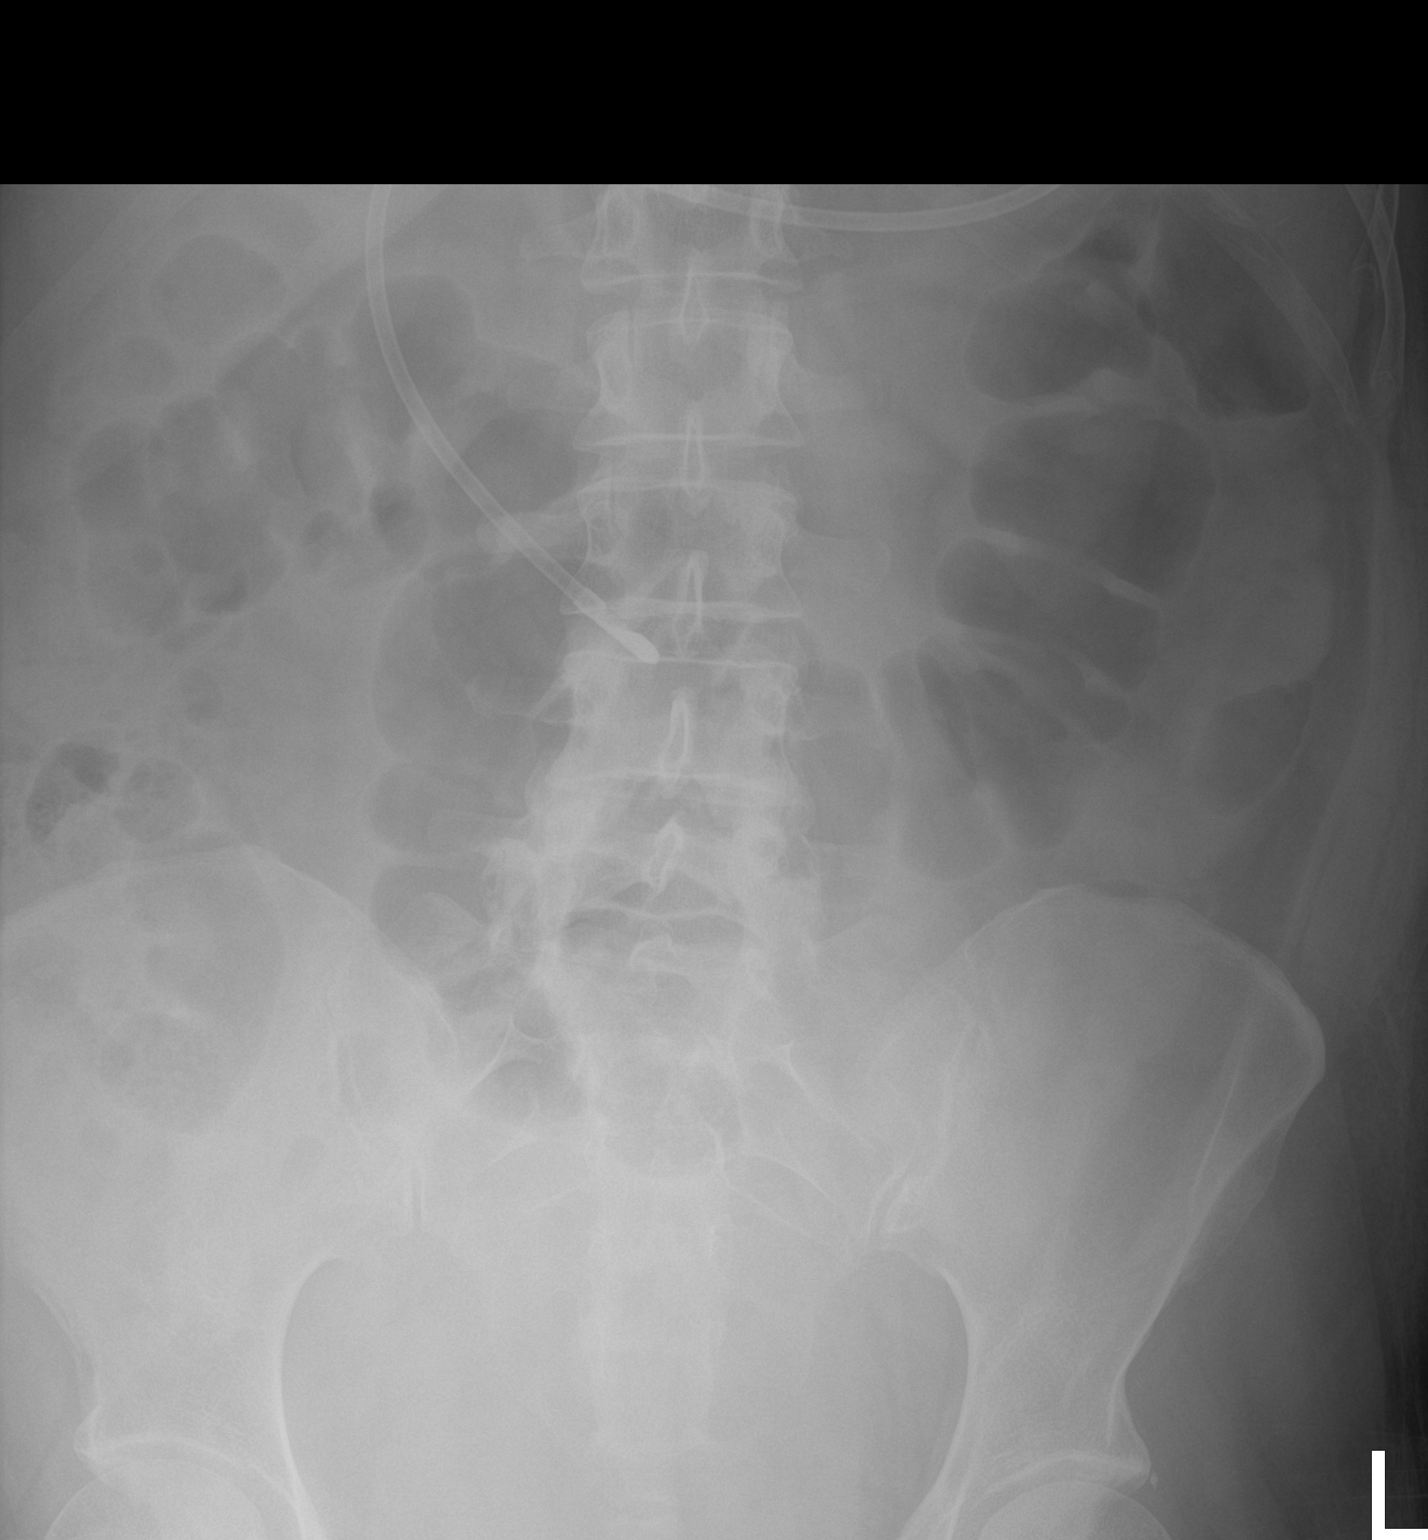

[1 of 1 positions shown; findings below may reference images not displayed]

FINDINGS: The bowel gas pattern is normal. Distal tip of feeding tube is seen
in expected position of third portion of duodenum.
IMPRESSION: Distal tip of feeding tube is seen in expected position of third
portion of duodenum. No evidence of bowel obstruction or ileus is
noted.

## 2019-07-21 ENCOUNTER — Encounter (HOSPITAL_BASED_OUTPATIENT_CLINIC_OR_DEPARTMENT_OTHER): Payer: Self-pay | Admitting: Emergency Medicine

## 2019-07-21 ENCOUNTER — Emergency Department (HOSPITAL_BASED_OUTPATIENT_CLINIC_OR_DEPARTMENT_OTHER): Payer: Medicare Other

## 2019-07-21 ENCOUNTER — Emergency Department (HOSPITAL_BASED_OUTPATIENT_CLINIC_OR_DEPARTMENT_OTHER)
Admission: EM | Admit: 2019-07-21 | Discharge: 2019-07-21 | Disposition: A | Discharge status: Home / Self Care | Payer: Medicare Other | Attending: Emergency Medicine | Admitting: Emergency Medicine

## 2019-07-21 ENCOUNTER — Other Ambulatory Visit: Payer: Self-pay

## 2019-07-21 DIAGNOSIS — R1110 Vomiting, unspecified: Secondary | ICD-10-CM | POA: Insufficient documentation

## 2019-07-21 DIAGNOSIS — N183 Chronic kidney disease, stage 3 unspecified: Secondary | ICD-10-CM | POA: Diagnosis not present

## 2019-07-21 DIAGNOSIS — E1122 Type 2 diabetes mellitus with diabetic chronic kidney disease: Secondary | ICD-10-CM | POA: Diagnosis not present

## 2019-07-21 DIAGNOSIS — Z79899 Other long term (current) drug therapy: Secondary | ICD-10-CM | POA: Insufficient documentation

## 2019-07-21 DIAGNOSIS — E1165 Type 2 diabetes mellitus with hyperglycemia: Secondary | ICD-10-CM | POA: Diagnosis not present

## 2019-07-21 DIAGNOSIS — R739 Hyperglycemia, unspecified: Secondary | ICD-10-CM

## 2019-07-21 DIAGNOSIS — Z7982 Long term (current) use of aspirin: Secondary | ICD-10-CM | POA: Diagnosis not present

## 2019-07-21 DIAGNOSIS — R42 Dizziness and giddiness: Secondary | ICD-10-CM | POA: Insufficient documentation

## 2019-07-21 DIAGNOSIS — R079 Chest pain, unspecified: Secondary | ICD-10-CM | POA: Insufficient documentation

## 2019-07-21 DIAGNOSIS — I129 Hypertensive chronic kidney disease with stage 1 through stage 4 chronic kidney disease, or unspecified chronic kidney disease: Secondary | ICD-10-CM | POA: Insufficient documentation

## 2019-07-21 HISTORY — DX: Major depressive disorder, single episode, unspecified: F32.9

## 2019-07-21 HISTORY — DX: Other problems related to lifestyle: Z72.89

## 2019-07-21 LAB — COMPREHENSIVE METABOLIC PANEL
ALT: 26 U/L (ref 0–44)
AST: 25 U/L (ref 15–41)
Albumin: 3.7 g/dL (ref 3.5–5.0)
Alkaline Phosphatase: 89 U/L (ref 38–126)
Anion gap: 12 (ref 5–15)
BUN: 32 mg/dL — ABNORMAL HIGH (ref 6–20)
CO2: 20 mmol/L — ABNORMAL LOW (ref 22–32)
Calcium: 9.8 mg/dL (ref 8.9–10.3)
Chloride: 100 mmol/L (ref 98–111)
Creatinine, Ser: 1.58 mg/dL — ABNORMAL HIGH (ref 0.44–1.00)
GFR calc Af Amer: 47 mL/min — ABNORMAL LOW (ref >60)
GFR calc non Af Amer: 40 mL/min — ABNORMAL LOW (ref >60)
Glucose, Bld: 294 mg/dL — ABNORMAL HIGH (ref 70–99)
Potassium: 5 mmol/L (ref 3.5–5.1)
Sodium: 132 mmol/L — ABNORMAL LOW (ref 135–145)
Total Bilirubin: 0.3 mg/dL (ref 0.3–1.2)
Total Protein: 6.4 g/dL — ABNORMAL LOW (ref 6.5–8.1)

## 2019-07-21 LAB — CBC WITH DIFFERENTIAL/PLATELET
Abs Immature Granulocytes: 0.02 10*3/uL (ref 0.00–0.07)
Basophils Absolute: 0.1 10*3/uL (ref 0.0–0.1)
Basophils Relative: 1 %
Eosinophils Absolute: 0.2 10*3/uL (ref 0.0–0.5)
Eosinophils Relative: 3 %
HCT: 37 % (ref 36.0–46.0)
Hemoglobin: 13.1 g/dL (ref 12.0–15.0)
Immature Granulocytes: 0 %
Lymphocytes Relative: 40 %
Lymphs Abs: 2.7 10*3/uL (ref 0.7–4.0)
MCH: 30.7 pg (ref 26.0–34.0)
MCHC: 35.4 g/dL (ref 30.0–36.0)
MCV: 86.7 fL (ref 80.0–100.0)
Monocytes Absolute: 0.5 10*3/uL (ref 0.1–1.0)
Monocytes Relative: 7 %
Neutro Abs: 3.3 10*3/uL (ref 1.7–7.7)
Neutrophils Relative %: 49 %
Platelets: 257 10*3/uL (ref 150–400)
RBC: 4.27 MIL/uL (ref 3.87–5.11)
RDW: 13.2 % (ref 11.5–15.5)
WBC: 6.8 10*3/uL (ref 4.0–10.5)
nRBC: 0 % (ref 0.0–0.2)

## 2019-07-21 LAB — URINALYSIS, MICROSCOPIC (REFLEX): Squamous Epithelial / HPF: NONE SEEN (ref 0–5)

## 2019-07-21 LAB — URINALYSIS, ROUTINE W REFLEX MICROSCOPIC
Bilirubin Urine: NEGATIVE
Glucose, UA: 250 mg/dL — AB
Ketones, ur: NEGATIVE mg/dL
Leukocytes,Ua: NEGATIVE
Nitrite: NEGATIVE
Protein, ur: 100 mg/dL — AB
Specific Gravity, Urine: 1.02 (ref 1.005–1.030)
pH: 6 (ref 5.0–8.0)

## 2019-07-21 LAB — D-DIMER, QUANTITATIVE: D-Dimer, Quant: 0.37 ug/mL-FEU (ref 0.00–0.50)

## 2019-07-21 LAB — TROPONIN I (HIGH SENSITIVITY): Troponin I (High Sensitivity): 2 ng/L (ref <18)

## 2019-07-21 LAB — LIPASE, BLOOD: Lipase: 79 U/L — ABNORMAL HIGH (ref 11–51)

## 2019-07-21 MED ORDER — SODIUM CHLORIDE 0.9 % IV BOLUS
500.0000 mL | Freq: Once | INTRAVENOUS | Status: AC
  Administered 2019-07-21: 500 mL via INTRAVENOUS

## 2019-07-21 MED ORDER — LORAZEPAM 2 MG/ML IJ SOLN
1.0000 mg | Freq: Once | INTRAMUSCULAR | Status: AC
  Administered 2019-07-21: 1 mg via INTRAVENOUS
  Filled 2019-07-21: qty 1

## 2019-07-21 MED ORDER — LORAZEPAM 0.5 MG PO TABS: 0.5000 mg | ORAL_TABLET | Freq: Three times a day (TID) | ORAL | 0 refills | Status: DC | PRN

## 2019-07-21 MED ORDER — ONDANSETRON HCL 4 MG/2ML IJ SOLN
4.0000 mg | Freq: Once | INTRAMUSCULAR | Status: AC
  Administered 2019-07-21: 4 mg via INTRAVENOUS
  Filled 2019-07-21: qty 2

## 2019-07-21 MED ORDER — SODIUM CHLORIDE 0.9 % IV BOLUS
1000.0000 mL | Freq: Once | INTRAVENOUS | Status: AC
  Administered 2019-07-21: 1000 mL via INTRAVENOUS

## 2019-07-21 MED ORDER — MECLIZINE HCL 25 MG PO TABS
25.0000 mg | ORAL_TABLET | Freq: Once | ORAL | Status: AC
  Administered 2019-07-21: 25 mg via ORAL
  Filled 2019-07-21: qty 1

## 2019-07-21 NOTE — ED Notes (Signed)
Pt reports some improvement in symptoms, lingering drowsiness following ativan.  Provider aware of need for urine collection.

## 2019-07-21 NOTE — ED Notes (Signed)
Pt aware of need for urine collection.  Unable to provide at this time,  IV fluids infusing.

## 2019-07-21 NOTE — ED Notes (Signed)
Pt on monitor 

## 2019-07-21 NOTE — ED Notes (Signed)
Pt to xray

## 2019-07-21 NOTE — Discharge Instructions (Signed)
You were seen in the emergency department today for dizziness with nausea and vomiting as well as chest pain.  Your work-up in the ER was overall reassuring.  Your chest x-ray was normal.  Your EKG and heart enzymes not show signs of a heart attack.  Your blood test did not show signs of a blood clot in your lungs.  Your kidney function and lipase (measure of pancreatic function) were both somewhat elevated, however they are similar to prior labs you have had done.  Your blood sugar was elevated today close to 300, you were given fluids which should help with this, but please be sure to check your blood sugars at home regularly.  We are sending you home with Ativan to try to help with dizziness to take in addition to your meclizine and Phenergan.  Please take this every 8 hours as needed for dizziness.  This is a controlled substance medication, do not take this with alcohol or other sedating medicines as it can make you sleepy.  Do not drive or operate heavy machinery when taking this medicine.  We have prescribed you new medication(s) today. Discuss the medications prescribed today with your pharmacist as they can have adverse effects and interactions with your other medicines including over the counter and prescribed medications. Seek medical evaluation if you start to experience new or abnormal symptoms after taking one of these medicines, seek care immediately if you start to experience difficulty breathing, feeling of your throat closing, facial swelling, or rash as these could be indications of a more serious allergic reaction  Please follow-up with your ear nose and throat doctor by calling the office tomorrow to schedule a follow-up appointment within the next 3 days.  Please also follow-up with your primary care provider in regards to your chest pain.  Return to the emergency department for new or worsening symptoms including but not limited to inability to keep fluids down, worsening dizziness,  numbness, weakness, change in your vision, headache, return of chest pain, or any other concerns.

## 2019-07-21 NOTE — ED Provider Notes (Signed)
[facility]ERG[facility] DEPARTMENT Provider Note   CSN: [id number] Arrival date & time: 07/21/19  1435     History Chief Complaint  Patient presents with  . Dizziness  . Emesis    Brandi Love is a 44 y.o. female with a history of congenital deafness, anemia, chronic renal insufficiency, prior PE no longer on anticoagulation, diabetes, hyperlipidemia, migraines, hypothyroidism, & borderline personality disorder who presents to the ED with complaints of dizziness x 3 days. Patient describes dizziness as the room spinning sensation, worsened with quick head movements and or position changes, alleviated by staying still. Reports associated nausea & vomiting. Had recent cochlear implant to L side 03/18, initially was doing okay, called her ENT with onset of sxs and was prescribed phenergan & meclizine- this resolved emesis but she remains dizzy with nausea. Also states today she developed some chest pain when she was woken from sleep by a telephone call around 10AM. Pain is to the L chest and to the upper abdomen, constant, worse with certain positions, no alleviating factors. Does not feel like her prior PE. Denies fever, chills, hematemesis, ear drainage, ear pain, diarrhea, melena, hematochezia, dysuria, dyspnea, diaphoresis, syncope, numbness, or weakness. Denies leg pain/swelling, hemoptysis, recent trauma, recent long travel, hormone use, or personal history of cancer. Patient states her sxs feel similar to what she experienced when she had her last cochlear implant procedure, states they did not resolve until outer ear implant was placed- this is scheduled for 07/29/19.    Per chart review for additional history:  Followed by otolaryngologist Dr. Lucius Conn with [facility]- history of bilateral cochlear implantation (left side 2010, right side 2004), 2012 head trauma--> L temporal bone fracture & dislodgement of L electrode.  SURGERY 07/02/19: 1. Explantation of left cochlear device electrode  from round window and reciever/stimulator from subperiosteal pocket 2. New cochlear implantation device placed in round window (Advanced BionicsHiRes Ultra 3D V2 (MidScala)) 3. Normal impedance values; placement verified with xray    Patient able to read lips, offered formal sign language translator and she declined.   HPI     Past Medical History:  Diagnosis Date  . Anemia   . Anorexia   . Anxiety   . Arthritis    "hands" (09/18/2016)  . Chronic kidney disease   . Chronic lower back pain   . Chronic renal insufficiency   . Deaf   . Deafness   . Depression   . Diabetes ([place])    "borderline"  . Eating disorder   . Endometriosis   . GERD (gastroesophageal reflux disease)   . History of kidney stones   . Hyperlipidemia   . 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 Migraine    "frequency varies" (09/18/2016)  . OCD (obsessive compulsive disorder)   . Pneumonia 2011  . PONV (postoperative nausea and vomiting)   . Pulmonary embolism ([place]) 10/2001  . Seizures ([place]) 09/2001 X 1   "day after my hysterectomy"  . Seizures ([place])   . Tachycardia     Patient Active Problem List   Diagnosis Date Noted  . Deaf 10/01/2018  . Hematochezia 10/01/2018  . Hypertension 10/01/2018  . Loss of appetite 10/01/2018  . Mental health problem 10/01/2018  . Seizures ([place]) 10/01/2018  . Abdominal pain 07/02/2018  . Gastroesophageal reflux disease without esophagitis 02/16/2018  . Primary insomnia 02/16/2018  . Prediabetes 01/28/2018  . Type 2 diabetes mellitus without complication, without long-term current use of insulin ([place]) 01/28/2018  .  Syncope 08/26/2017  . Acute renal failure superimposed on stage 3 chronic kidney disease ([place]) 08/26/2017  . Orthostatic syncope   . Self mutilating behavior 08/08/2017  . Acne 06/11/2017  . Self-care deficit for bathing 06/11/2017  . Urinary tract infection with hematuria   . OCD (obsessive compulsive disorder) 09/29/2016  . Anorexia  nervosa with bulimia 09/29/2016  . Deafness congenital 09/29/2016  . Intractable nausea and vomiting 09/29/2016  . Bloody diarrhea 09/29/2016  . AKI (acute kidney injury) ([place]) 09/29/2016  . Severe dehydration 09/29/2016  . Severe protein-calorie malnutrition ([place]) 09/29/2016  . Congenital hypothyroidism 09/29/2016  . Hypertriglyceridemia 09/29/2016  . Anemia 09/29/2016  . Endometriosis 09/29/2016  . MDD (major depressive disorder) 09/29/2016  . Hyperlipidemia 09/29/2016  . CKD (chronic kidney disease), stage III ([place]) 09/29/2016  . Acute kidney injury ([place]) 09/29/2016  . OAB (overactive bladder) 09/29/2016  . Sepsis ([place]) 09/18/2016  . Acute pyelonephritis 09/18/2016  . Urinary incontinence 08/09/2015  . MDD (major depressive disorder), recurrent, severe, with psychosis ([place]) 12/11/2014  . Severe recurrent major depressive disorder with psychotic symptoms ([place]) 12/11/2014  . Migraine 07/02/2013  . Eating disorder, unspecified 11/23/2012  . Hypothyroidism 11/23/2012  . Dyslipidemia 11/23/2012  . Sinus tachycardia 11/23/2012  . CKD (chronic kidney disease) stage 3, GFR 30-59 ml/min 11/18/2012  . Protein-calorie malnutrition, severe ([place]) 11/13/2012  . Abdominal pain, epigastric 11/12/2012  . Dehydration 11/12/2012  . Acute on chronic renal failure ([place]) 11/12/2012  . Leukocytosis, unspecified 11/12/2012  . Hyponatremia 11/12/2012  . Borderline personality disorder ([place]) 11/12/2012    Past Surgical History:  Procedure Laterality Date  . ABDOMINAL HYSTERECTOMY  09/2001   'except for right ovary'  . BREAST LUMPECTOMY Left 2000   benign  . BREAST LUMPECTOMY Right 2002   benign  . COCHLEAR IMPLANT Bilateral 2004-2010   "right-left"  . COLONOSCOPY  06/2015    Dr Rolm Bookbinder at [place] for rectal bleeding: internal hemorrhoids, small anal fissure.    . ESOPHAGOGASTRODUODENOSCOPY N/A 10/07/2016   Procedure: ESOPHAGOGASTRODUODENOSCOPY (EGD);  Surgeon: Ladene Artist, MD;   Location: [facility] ENDOSCOPY;  Service: Endoscopy;  Laterality: N/A;  . EYE MUSCLE SURGERY Left 1980   "lazy eye"  . EYE MUSCLE SURGERY Right 1990   "lazy eye"  . LAPAROSCOPIC ABDOMINAL EXPLORATION  2001 and 2002   for endometriosis     OB History   No obstetric history on file.     Family History  Problem Relation Age of Onset  . Cancer Father   . Hyperlipidemia Father   . Osteoarthritis Mother   . Hyperlipidemia Mother     Social History   Tobacco Use  . Smoking status: Never Smoker  . Smokeless tobacco: Never Used  Substance Use Topics  . Alcohol use: Never  . Drug use: Never    Home Medications Prior to Admission medications   Medication Sig Start Date End Date Taking? Authorizing Provider  acetaminophen (TYLENOL) 500 MG tablet Take 1,000-3,000 mg by mouth See admin instructions. Take 2 tablets in the morning, at lunch and take 6 tablets at bedtime    [provider]  aspirin EC 81 MG tablet Take 81 mg by mouth daily.    [provider]  cyclobenzaprine (FLEXERIL) 5 MG tablet Take by mouth 3 (three) times daily as needed.  06/30/18   [provider]  docusate sodium (COLACE) 100 MG capsule Take 2 capsules at bedtime 07/14/18   [provider]  fluticasone (FLONASE) 50 MCG/ACT nasal spray Place  2 sprays into both nostrils 2 (two) times daily as needed.  05/03/17   [provider]  gemfibrozil (LOPID) 600 MG tablet TAKE 1 TABLET BY MOUTH IN THE MORNING. TAKE 1/2 TABLET IN THE EVENING 09/09/17   [provider]  lamoTRIgine (LAMICTAL) 150 MG tablet Take 150 mg by mouth daily.     [provider]  levothyroxine (SYNTHROID) 137 MCG tablet Take 137 mcg by mouth daily.  07/17/17   [provider]  lidocaine (LIDODERM) 5 % Place onto the skin. 08/07/18   [provider]  lurasidone (LATUDA) 80 MG TABS tablet Take 80 mg by mouth daily with breakfast.    [provider]  magnesium oxide (MAG-OX) 400  (241.3 Mg) MG tablet Take by mouth 2 (two) times a day. 11/06/17   [provider]  Melatonin 5 MG TABS Take 3 mg by mouth at bedtime.    [provider]  Multiple Vitamin (MULTIVITAMIN WITH MINERALS) TABS Take 1 tablet by mouth daily.    [provider]  Multiple Vitamins-Minerals (TAB-A-VITE) TABS Take by mouth. 11/06/17   [provider]  ondansetron (ZOFRAN-ODT) 8 MG disintegrating tablet Take by mouth. 06/24/18   [provider]  pantoprazole (PROTONIX) 40 MG tablet Take 40 mg by mouth daily.    [provider]  pregabalin (LYRICA) 50 MG capsule Take 50 mg by mouth 2 (two) times daily.     [provider]  QUEtiapine (SEROQUEL) 25 MG tablet Take 12.5 mg by mouth at bedtime.    [provider]  topiramate (TOPAMAX) 50 MG tablet Take 50 mg by mouth 2 (two) times daily.     [provider]  traZODone (DESYREL) 50 MG tablet Take by mouth. 1.5 tabs bedtime 04/27/18   [provider]  venlafaxine XR (EFFEXOR-XR) 75 MG 24 hr capsule Take by mouth. 3 daily 08/13/17   [provider]  Vitamin D, Ergocalciferol, (DRISDOL) 50000 UNITS CAPS Take 50,000 Units by mouth every 7 (seven) days. On Sunday    [provider]    Allergies    Iodinated diagnostic agents, Melatonin, Phenylephrine-guaifenesin, Propoxyphene, Abilify [aripiprazole], Dhea [nutritional supplements], Dihydroergotamine, Divalproex sodium, Doxycycline, Erythromycin, Fish oil, Indomethacin, Lactose intolerance (gi), Melatonin, Risperidone and related, Shellfish allergy, Statins, Tape, Dihydroergotamine, Lactose intolerance (gi), Nsaids, and Tape  Review of Systems   Review of Systems  Constitutional: Negative for chills and fever.  HENT: Negative for ear discharge and ear pain.   Eyes: Negative for visual disturbance.  Respiratory: Negative for shortness of breath.   Cardiovascular: Positive for chest pain.  Gastrointestinal: Positive  for abdominal pain, nausea and vomiting. Negative for blood in stool, constipation and diarrhea.  Genitourinary: Negative for dysuria and vaginal bleeding.  Neurological: Positive for dizziness. Negative for syncope, facial asymmetry, speech difficulty, weakness and numbness.  All other systems reviewed and are negative.   Physical Exam Updated Vital Signs BP (!) 163/105 (BP Location: Left Arm)   Pulse (!) 104   Temp 98.7 F (37.1 C) (Oral)   Resp 16   Ht 5\' 8"  (1.727 m)   Wt 78.4 kg   LMP  (LMP Unknown)   SpO2 100%   BMI 26.27 kg/m   Physical Exam Vitals and nursing note reviewed.  Constitutional:      General: She is not in acute distress.    Appearance: She is well-developed.  HENT:     Head: Normocephalic and atraumatic.     Right Ear: Ear canal normal. Tympanic  membrane is not perforated, retracted or bulging.     Left Ear: Ear canal normal. Tympanic membrane is not perforated, retracted or bulging.     Ears:     Comments: There is what appears to be a small amount of blood to the superior aspect of the L TM which patient states was present @ last PCP appointment.  No mastoid erythema/swelling/tenderness.     Nose:     Right Sinus: No maxillary sinus tenderness or frontal sinus tenderness.     Left Sinus: No maxillary sinus tenderness or frontal sinus tenderness.     Mouth/Throat:     Pharynx: Uvula midline. No oropharyngeal exudate or posterior oropharyngeal erythema.     Comments: Posterior oropharynx is symmetric appearing. Patient tolerating own secretions without difficulty. No trismus. No drooling. No hot potato voice. No swelling beneath the tongue, submandibular compartment is soft.  Eyes:     General:        Right eye: No discharge.        Left eye: No discharge.     Extraocular Movements: Extraocular movements intact.     Conjunctiva/sclera: Conjunctivae normal.     Pupils: Pupils are equal, round, and reactive to light.     Comments: No bidirectional,  rotation, or vertical nystagmus noted.   Cardiovascular:     Rate and Rhythm: Regular rhythm. Tachycardia present.     Heart sounds: No murmur.  Pulmonary:     Effort: Pulmonary effort is normal. No respiratory distress.     Breath sounds: Normal breath sounds. No wheezing, rhonchi or rales.  Chest:     Chest wall: Tenderness (left anterior chest wall which reproduces patient's chest pain) present.  Abdominal:     General: There is no distension.     Palpations: Abdomen is soft.     Tenderness: There is abdominal tenderness (mild upper abdomen). There is no guarding or rebound. Negative signs include Murphy's sign.  Musculoskeletal:        General: No tenderness.     Cervical back: Normal range of motion and neck supple. No edema or rigidity.     Right lower leg: No edema.     Left lower leg: No edema.  Lymphadenopathy:     Cervical: No cervical adenopathy.  Skin:    General: Skin is warm and dry.     Findings: No rash.  Neurological:     Mental Status: She is alert.     Comments: Alert. Clear speech. CN III-XII grossly intact. Sensation grossly intact x 4. 5/5 symmetric grip strength. 5/5 symmetric strength with plantar/dorsiflexion. Normal finger to nose. Negative pronator drift.   Psychiatric:        Behavior: Behavior normal.     ED Results / Procedures / Treatments   Labs (all labs ordered are listed, but only abnormal results are displayed) Labs Reviewed  COMPREHENSIVE METABOLIC PANEL - Abnormal; Notable for the following components:      Result Value   Sodium 132 (*)    CO2 20 (*)    Glucose, Bld 294 (*)    BUN 32 (*)    Creatinine, Ser 1.58 (*)    Total Protein 6.4 (*)    GFR calc non Af Amer 40 (*)    GFR calc Af Amer 47 (*)    All other components within normal limits  LIPASE, BLOOD - Abnormal; Notable for the following components:   Lipase 79 (*)    All other components within normal limits  URINALYSIS, ROUTINE W REFLEX MICROSCOPIC - Abnormal; Notable for  the following components:   Glucose, UA 250 (*)    Hgb urine dipstick TRACE (*)    Protein, ur 100 (*)    All other components within normal limits  URINALYSIS, MICROSCOPIC (REFLEX) - Abnormal; Notable for the following components:   Bacteria, UA RARE (*)    All other components within normal limits  CBC WITH DIFFERENTIAL/PLATELET  D-DIMER, QUANTITATIVE (NOT AT [facility])  TROPONIN I (HIGH SENSITIVITY)    EKG EKG Interpretation  Date/Time:  Wednesday July 21 2019 14:43:28 EDT Ventricular Rate:  105 PR Interval:    QRS Duration: 85 QT Interval:  320 QTC Calculation: 423 R Axis:   30 Text Interpretation: Sinus tachycardia Low voltage, precordial leads Borderline T wave abnormalities Confirmed by Virgel Manifold [phone]) on 07/21/2019 4:56:21 PM   Radiology DG Chest 2 View  Result Date: 07/21/2019 CLINICAL DATA:  Chest pain, dizziness, emesis EXAM: CHEST - 2 VIEW COMPARISON:  03/03/2019 FINDINGS: The heart size and mediastinal contours are within normal limits. Both lungs are clear. The visualized skeletal structures are unremarkable. IMPRESSION: No active cardiopulmonary disease. Electronically Signed   By: Randa Ngo M.D.   On: 07/21/2019 15:53    Procedures Procedures (including critical care time)  Medications Ordered in ED Medications  sodium chloride 0.9 % bolus 1,000 mL (0 mLs Intravenous Stopped 07/21/19 1707)  ondansetron (ZOFRAN) injection 4 mg (4 mg Intravenous Given 07/21/19 1614)  LORazepam (ATIVAN) injection 1 mg (1 mg Intravenous Given 07/21/19 1617)  sodium chloride 0.9 % bolus 500 mL ( Intravenous Stopped 07/21/19 1814)  meclizine (ANTIVERT) tablet 25 mg (25 mg Oral Given 07/21/19 1757)    ED Course  I have reviewed the triage vital signs and the nursing notes.  Pertinent labs & imaging results that were available during my care of the patient were reviewed by me and considered in my medical decision making (see chart for details).    Suzetta Timko Dunker was evaluated  in Emergency Department on 07/21/2019 for the symptoms described in the history of present illness. He/she was evaluated in the context of the global COVID-19 pandemic, which necessitated consideration that the patient might be at risk for infection with the SARS-CoV-2 virus that causes COVID-19. Institutional protocols and algorithms that pertain to the evaluation of patients at risk for COVID-19 are in a state of rapid change based on information released by regulatory bodies including the CDC and federal and state organizations. These policies and algorithms were followed during the patient's care in the ED.  MDM Rules/Calculators/A&P                      Patient presents to the ED with complaints of dizziness w/ N/V x 3 days. States sxs feel same as following prior cochlear implant, she is s/p this procedure 07/08/19. She also mentions some chest discomfort that began this AM. Patient is nontoxic, resting comfortably, vitals with mild tachycardia and somewhat elevated BP. On exam patient has no focal neuro deficits. Her chest pain is reproducible with chest wall palpation. Mild upper abdominal tenderness noted without peritoneal signs. Plan for EKG, labs, CXR. Will given zofran, ativan, & fluids.   CBC: No anemia or leukocytosis.  CMP: Renal function abnormal but similar to prior. LFTs WNL. Hyperglycemia with bicarb 20, no anion gap elevation. Mild electrolyte abnormalities as above without significant derangement.  Lipase: Elevated some @ 70- similar to prior ranges.  UA: proteinuria, glucosuria, no  obvious infection without urinary sxs.  Troponin: 2, EKG no STEMI, > 6 hours of pain, reproducible pain- doubt ACS.  Patient moderate risk wells- ddimer WNL, pain reproducible- doubt PE CXR without pneumothorax or pneumonia. Also no widened mediastinum, doubt dissection.   Overall reassuring work-up. Repeat abdominal exams remain without peritoneal signs- doubt acute surgical abdominal pathology such  as cholecystitis, appendicitis, or perf.  Remains with no focal neuro deficits, dizziness is triggered by position changes/head movements, hx of similar w/ prior cochlear implant, no rotational/horizontal/bidirectional nystagmus- do not suspect central cause for vertigo such as acute CVA. Seems most consistent w/ peripheral vertigo. Feeling improved s/p ED interventions. Tolerating PO. Ambulatory. Will discharge home with PRN ativan- discussed that this is a controlled substance. Close follow up with her ENT. I discussed results, treatment plan, need for follow-up, and return precautions with the patient. Provided opportunity for questions, patient confirmed understanding and is in agreement with plan.   This is a shared visit with supervising physician Dr. Wilson Singer who has independently evaluated patient & provided guidance in evaluation/management/disposition, in agreement with care   Final Clinical Impression(s) / ED Diagnoses Final diagnoses:  Dizziness  Hyperglycemia  Chest pain, unspecified type    Rx / DC Orders ED Discharge Orders         Ordered    LORazepam (ATIVAN) 0.5 MG tablet  Every 8 hours PRN     07/21/19 1957           Leafy Kindle 07/21/19 2010    Virgel Manifold, MD 07/23/19 [phone]

## 2019-07-21 NOTE — ED Triage Notes (Signed)
Per EMS:  Pt had cochlear implant place 3 weeks ago.  Started with dizziness, N/V for 3 days.  Pt states this happened with last implant and subsided once they attached new implant to outer ear canal.  Pt to have done on April 8th.

## 2019-07-21 NOTE — ED Notes (Signed)
Pt ambulated to bathroom with steady gait. 

## 2019-08-12 ENCOUNTER — Emergency Department (HOSPITAL_BASED_OUTPATIENT_CLINIC_OR_DEPARTMENT_OTHER): Payer: Medicare Other

## 2019-08-12 ENCOUNTER — Other Ambulatory Visit: Payer: Self-pay

## 2019-08-12 ENCOUNTER — Encounter (HOSPITAL_BASED_OUTPATIENT_CLINIC_OR_DEPARTMENT_OTHER): Payer: Self-pay | Admitting: Emergency Medicine

## 2019-08-12 ENCOUNTER — Emergency Department (HOSPITAL_BASED_OUTPATIENT_CLINIC_OR_DEPARTMENT_OTHER)
Admission: EM | Admit: 2019-08-12 | Discharge: 2019-08-13 | Disposition: A | Discharge status: Home / Self Care | Payer: Medicare Other | Attending: Emergency Medicine | Admitting: Emergency Medicine

## 2019-08-12 DIAGNOSIS — Z79899 Other long term (current) drug therapy: Secondary | ICD-10-CM | POA: Diagnosis not present

## 2019-08-12 DIAGNOSIS — N83291 Other ovarian cyst, right side: Secondary | ICD-10-CM | POA: Diagnosis not present

## 2019-08-12 DIAGNOSIS — Z7982 Long term (current) use of aspirin: Secondary | ICD-10-CM | POA: Diagnosis not present

## 2019-08-12 DIAGNOSIS — I129 Hypertensive chronic kidney disease with stage 1 through stage 4 chronic kidney disease, or unspecified chronic kidney disease: Secondary | ICD-10-CM | POA: Diagnosis not present

## 2019-08-12 DIAGNOSIS — Z7984 Long term (current) use of oral hypoglycemic drugs: Secondary | ICD-10-CM | POA: Insufficient documentation

## 2019-08-12 DIAGNOSIS — N183 Chronic kidney disease, stage 3 unspecified: Secondary | ICD-10-CM | POA: Insufficient documentation

## 2019-08-12 DIAGNOSIS — E1165 Type 2 diabetes mellitus with hyperglycemia: Secondary | ICD-10-CM | POA: Diagnosis not present

## 2019-08-12 DIAGNOSIS — E1122 Type 2 diabetes mellitus with diabetic chronic kidney disease: Secondary | ICD-10-CM | POA: Diagnosis not present

## 2019-08-12 DIAGNOSIS — R1084 Generalized abdominal pain: Secondary | ICD-10-CM

## 2019-08-12 DIAGNOSIS — N83209 Unspecified ovarian cyst, unspecified side: Secondary | ICD-10-CM

## 2019-08-12 DIAGNOSIS — R109 Unspecified abdominal pain: Secondary | ICD-10-CM | POA: Diagnosis present

## 2019-08-12 LAB — COMPREHENSIVE METABOLIC PANEL
ALT: 18 U/L (ref 0–44)
AST: 15 U/L (ref 15–41)
Albumin: 3.6 g/dL (ref 3.5–5.0)
Alkaline Phosphatase: 104 U/L (ref 38–126)
Anion gap: 14 (ref 5–15)
BUN: 41 mg/dL — ABNORMAL HIGH (ref 6–20)
CO2: 17 mmol/L — ABNORMAL LOW (ref 22–32)
Calcium: 9.5 mg/dL (ref 8.9–10.3)
Chloride: 93 mmol/L — ABNORMAL LOW (ref 98–111)
Creatinine, Ser: 1.81 mg/dL — ABNORMAL HIGH (ref 0.44–1.00)
GFR calc Af Amer: 40 mL/min — ABNORMAL LOW (ref >60)
GFR calc non Af Amer: 34 mL/min — ABNORMAL LOW (ref >60)
Glucose, Bld: 592 mg/dL (ref 70–99)
Potassium: 4.7 mmol/L (ref 3.5–5.1)
Sodium: 124 mmol/L — ABNORMAL LOW (ref 135–145)
Total Bilirubin: 0.5 mg/dL (ref 0.3–1.2)
Total Protein: 7.3 g/dL (ref 6.5–8.1)

## 2019-08-12 LAB — URINALYSIS, ROUTINE W REFLEX MICROSCOPIC
Bilirubin Urine: NEGATIVE
Glucose, UA: >=500 mg/dL — AB
Ketones, ur: NEGATIVE mg/dL
Leukocytes,Ua: NEGATIVE
Nitrite: NEGATIVE
Protein, ur: 100 mg/dL — AB
Specific Gravity, Urine: 1.015 (ref 1.005–1.030)
pH: 6 (ref 5.0–8.0)

## 2019-08-12 LAB — POCT I-STAT EG7
Acid-base deficit: 7 mmol/L — ABNORMAL HIGH (ref 0.0–2.0)
Bicarbonate: 17.6 mmol/L — ABNORMAL LOW (ref 20.0–28.0)
Calcium, Ion: 1.24 mmol/L (ref 1.15–1.40)
HCT: 35 % — ABNORMAL LOW (ref 36.0–46.0)
Hemoglobin: 11.9 g/dL — ABNORMAL LOW (ref 12.0–15.0)
O2 Saturation: 86 %
Patient temperature: 98.6
Potassium: 4.9 mmol/L (ref 3.5–5.1)
Sodium: 127 mmol/L — ABNORMAL LOW (ref 135–145)
TCO2: 19 mmol/L — ABNORMAL LOW (ref 22–32)
pCO2, Ven: 31 mmHg — ABNORMAL LOW (ref 44.0–60.0)
pH, Ven: 7.363 (ref 7.250–7.430)
pO2, Ven: 52 mmHg — ABNORMAL HIGH (ref 32.0–45.0)

## 2019-08-12 LAB — CBC WITH DIFFERENTIAL/PLATELET
Abs Immature Granulocytes: 0.18 10*3/uL — ABNORMAL HIGH (ref 0.00–0.07)
Basophils Absolute: 0 10*3/uL (ref 0.0–0.1)
Basophils Relative: 0 %
Eosinophils Absolute: 0 10*3/uL (ref 0.0–0.5)
Eosinophils Relative: 0 %
HCT: 34.4 % — ABNORMAL LOW (ref 36.0–46.0)
Hemoglobin: 12.4 g/dL (ref 12.0–15.0)
Immature Granulocytes: 2 %
Lymphocytes Relative: 9 %
Lymphs Abs: 0.9 10*3/uL (ref 0.7–4.0)
MCH: 31.2 pg (ref 26.0–34.0)
MCHC: 36 g/dL (ref 30.0–36.0)
MCV: 86.6 fL (ref 80.0–100.0)
Monocytes Absolute: 0.2 10*3/uL (ref 0.1–1.0)
Monocytes Relative: 2 %
Neutro Abs: 8.5 10*3/uL — ABNORMAL HIGH (ref 1.7–7.7)
Neutrophils Relative %: 87 %
Platelets: 285 10*3/uL (ref 150–400)
RBC: 3.97 MIL/uL (ref 3.87–5.11)
RDW: 13.2 % (ref 11.5–15.5)
WBC: 9.9 10*3/uL (ref 4.0–10.5)
nRBC: 0 % (ref 0.0–0.2)

## 2019-08-12 LAB — CBG MONITORING, ED
Glucose-Capillary: 431 mg/dL — ABNORMAL HIGH (ref 70–99)
Glucose-Capillary: 513 mg/dL (ref 70–99)
Glucose-Capillary: >600 mg/dL (ref 70–99)

## 2019-08-12 LAB — WET PREP, GENITAL
Sperm: NONE SEEN
Trich, Wet Prep: NONE SEEN
WBC, Wet Prep HPF POC: NONE SEEN
Yeast Wet Prep HPF POC: NONE SEEN

## 2019-08-12 LAB — URINALYSIS, MICROSCOPIC (REFLEX): WBC, UA: NONE SEEN WBC/hpf (ref 0–5)

## 2019-08-12 MED ORDER — INSULIN ASPART 100 UNIT/ML ~~LOC~~ SOLN
8.0000 [IU] | Freq: Once | SUBCUTANEOUS | Status: AC
  Administered 2019-08-12: 23:00:00 8 [IU] via SUBCUTANEOUS
  Filled 2019-08-12: qty 1

## 2019-08-12 MED ORDER — INSULIN ASPART 100 UNIT/ML ~~LOC~~ SOLN
5.0000 [IU] | Freq: Once | SUBCUTANEOUS | Status: AC
  Administered 2019-08-12: 5 [IU] via SUBCUTANEOUS
  Filled 2019-08-12: qty 1

## 2019-08-12 MED ORDER — SODIUM CHLORIDE 0.9 % IV BOLUS
2000.0000 mL | Freq: Once | INTRAVENOUS | Status: AC
  Administered 2019-08-12: 2000 mL via INTRAVENOUS

## 2019-08-12 MED ORDER — FENTANYL CITRATE (PF) 100 MCG/2ML IJ SOLN
50.0000 ug | Freq: Once | INTRAMUSCULAR | Status: AC
  Administered 2019-08-12: 50 ug via INTRAVENOUS
  Filled 2019-08-12: qty 2

## 2019-08-12 NOTE — ED Notes (Signed)
ED Provider at bedside. 

## 2019-08-12 NOTE — ED Notes (Signed)
Called [facility] ED for transfer for ultrasound spoke with Dr. Florina Ou

## 2019-08-12 NOTE — ED Triage Notes (Signed)
Abd pain all day, pt reports trying enema with no relief. Recent UTI with antibiotic and steriods. CBG with EMS reading HIGH

## 2019-08-12 NOTE — ED Notes (Signed)
Critical lab glucose 592 received; Dr Darl Householder aware.

## 2019-08-12 NOTE — ED Provider Notes (Signed)
I assumed care of this patient.  Please see previous provider note for further details of Hx, PE.  Briefly patient is a 44 y.o. female who presented lower abdominal pain found to have bilateral hemorrhagic cysts on CT renal study.  Patient is being transferred to [facility] to rule out ovarian torsion on ultrasound.  Patient also noted to be hyperglycemic.   Ultrasound without evidence of torsion.  Patient treated symptomatically for pain.  CBG improved.  No evidence of DKA.  Able to tolerate oral intake.  Stable for outpatient management.  The patient appears reasonably screened and/or stabilized for discharge and I doubt any other medical condition or other [facility] requiring further screening, evaluation, or treatment in the ED at this time prior to discharge. Safe for discharge with strict return precautions.  Disposition: Discharge  Condition: Good  I have discussed the results, Dx and Tx plan with the patient/family who expressed understanding and agree(s) with the plan. Discharge instructions discussed at length. The patient/family was given strict return precautions who verbalized understanding of the instructions. No further questions at time of discharge.    ED Discharge Orders    None       Follow Up: [facility] [address], [address] [id number] [place] [id number] [phone] Schedule an appointment as soon as possible for a visit    Bernerd Limbo, MD [place] [id number] [phone]  Schedule an appointment as soon as possible for a visit  For close follow up for your elevated blood sugars         Albena Comes, Grayce Sessions, MD 08/13/19 [phone]

## 2019-08-12 NOTE — ED Provider Notes (Signed)
[facility] DEPARTMENT Provider Note   CSN: [id number] Arrival date & time: 08/12/19  2011     History Chief Complaint  Patient presents with  . Abdominal Pain    Brandi Love is a 44 y.o. female.  HPI HPI Comments: Brandi Love is a 44 y.o. female with a significant medical history listed below now including diabetes mellitus who presents to the Emergency Department complaining of diffuse abdominal pain.  Patient states she has been constipated and has not had a bowel movement for the last 3 days.  For the last 24 hours she notes moderate diffuse abdominal pain.  She has tried multiple laxatives today including but not limited to Colace as well as an enema.  She denies any relief.  She notes a recent UTI earlier this month and was placed on 5 days of Cipro which mildly alleviated her symptoms but still complains of mild dysuria.  She has a history of gout in her right ankle and was placed on prednisone by her primary care doctor.  Patient reports associated chills, rhinorrhea and congestion consistent with her seasonal allergies.  She notes a surgical history to her abdomen including hysterectomy as well as multiple laparoscopies due to endometriosis.  She denies fevers, chest pain, shortness of breath, diarrhea, nausea, vomiting, vaginal discharge, syncope, numbness.    Past Medical History:  Diagnosis Date  . Anemia   . Anorexia   . Anxiety   . Arthritis    "hands" (09/18/2016)  . Chronic kidney disease   . Chronic lower back pain   . Chronic renal insufficiency   . Deaf   . Deafness   . Depression   . Diabetes ([place])    "borderline"  . Eating disorder   . Endometriosis   . GERD (gastroesophageal reflux disease)   . History of kidney stones   . Hyperlipidemia   . 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 MDD (major depressive disorder)   . Migraine    "frequency varies" (09/18/2016)  . OCD (obsessive compulsive disorder)   . Pneumonia  2011  . PONV (postoperative nausea and vomiting)   . Pulmonary embolism ([place]) 10/2001  . Seizures ([place]) 09/2001 X 1   "day after my hysterectomy"  . Seizures ([place])   . Self mutilating behavior   . Tachycardia     Patient Active Problem List   Diagnosis Date Noted  . Deaf 10/01/2018  . Hematochezia 10/01/2018  . Hypertension 10/01/2018  . Loss of appetite 10/01/2018  . Mental health problem 10/01/2018  . Seizures ([place]) 10/01/2018  . Abdominal pain 07/02/2018  . Gastroesophageal reflux disease without esophagitis 02/16/2018  . Primary insomnia 02/16/2018  . Prediabetes 01/28/2018  . Type 2 diabetes mellitus without complication, without long-term current use of insulin ([place]) 01/28/2018  . Syncope 08/26/2017  . Acute renal failure superimposed on stage 3 chronic kidney disease ([place]) 08/26/2017  . Orthostatic syncope   . Self mutilating behavior 08/08/2017  . Acne 06/11/2017  . Self-care deficit for bathing 06/11/2017  . Urinary tract infection with hematuria   . OCD (obsessive compulsive disorder) 09/29/2016  . Anorexia nervosa with bulimia 09/29/2016  . Deafness congenital 09/29/2016  . Intractable nausea and vomiting 09/29/2016  . Bloody diarrhea 09/29/2016  . AKI (acute kidney injury) ([place]) 09/29/2016  . Severe dehydration 09/29/2016  . Severe protein-calorie malnutrition ([place]) 09/29/2016  . Congenital hypothyroidism 09/29/2016  . Hypertriglyceridemia 09/29/2016  . Anemia 09/29/2016  . Endometriosis 09/29/2016  .  MDD (major depressive disorder) 09/29/2016  . Hyperlipidemia 09/29/2016  . CKD (chronic kidney disease), stage III () 09/29/2016  . Acute kidney injury ([place]) 09/29/2016  . OAB (overactive bladder) 09/29/2016  . Sepsis ([place]) 09/18/2016  . Acute pyelonephritis 09/18/2016  . Urinary incontinence 08/09/2015  . MDD (major depressive disorder), recurrent, severe, with psychosis ([place]) 12/11/2014  . Severe recurrent major depressive disorder with psychotic  symptoms ([place]) 12/11/2014  . Migraine 07/02/2013  . Eating disorder, unspecified 11/23/2012  . Hypothyroidism 11/23/2012  . Dyslipidemia 11/23/2012  . Sinus tachycardia 11/23/2012  . CKD (chronic kidney disease) stage 3, GFR 30-59 ml/min 11/18/2012  . Protein-calorie malnutrition, severe ([place] 11/13/2012  . Abdominal pain, epigastric 11/12/2012  . Dehydration 11/12/2012  . Acute on chronic renal failure ([place]) 11/12/2012  . Leukocytosis, unspecified 11/12/2012  . Hyponatremia 11/12/2012  . Borderline personality disorder ([place]) 11/12/2012    Past Surgical History:  Procedure Laterality Date  . ABDOMINAL HYSTERECTOMY  09/2001   'except for right ovary'  . BREAST LUMPECTOMY Left 2000   benign  . BREAST LUMPECTOMY Right 2002   benign  . COCHLEAR IMPLANT Bilateral 2004-2010   "right-left"  . COLONOSCOPY  06/2015    Dr Rolm Bookbinder at [facility] for rectal bleeding: internal hemorrhoids, small anal fissure.    . ESOPHAGOGASTRODUODENOSCOPY N/A 10/07/2016   Procedure: ESOPHAGOGASTRODUODENOSCOPY (EGD);  Surgeon: Ladene Artist, MD;  Location: [facility] ENDOSCOPY;  Service: Endoscopy;  Laterality: N/A;  . EYE MUSCLE SURGERY Left 1980   "lazy eye"  . EYE MUSCLE SURGERY Right 1990   "lazy eye"  . LAPAROSCOPIC ABDOMINAL EXPLORATION  2001 and 2002   for endometriosis     OB History   No obstetric history on file.     Family History  Problem Relation Age of Onset  . Cancer Father   . Hyperlipidemia Father   . Osteoarthritis Mother   . Hyperlipidemia Mother     Social History   Tobacco Use  . Smoking status: Never Smoker  . Smokeless tobacco: Never Used  Substance Use Topics  . Alcohol use: Never  . Drug use: Never    Home Medications Prior to Admission medications   Medication Sig Start Date End Date Taking? Authorizing Provider  allopurinol (ZYLOPRIM) 300 MG tablet Take by mouth. 08/12/19  Yes [provider]  cyclobenzaprine (FLEXERIL) 5 MG tablet Take 1 tablet  by mouth three times daily as needed for muscle spasm 06/30/18  Yes [provider]  ergocalciferol (VITAMIN D2) 1.25 MG (50000 UT) capsule Take 1 capsule by mouth once a week 03/25/19  Yes [provider]  gemfibrozil (LOPID) 600 MG tablet Take 1 tablet by mouth in the morning, and take 1/2 tablet in the evening. 11/03/18  Yes [provider]  glimepiride (AMARYL) 4 MG tablet Take by mouth. 06/14/19  Yes [provider]  levothyroxine (SYNTHROID) 137 MCG tablet TAKE ONE TABLET BY MOUTH DAILY 03/23/19  Yes [provider]  LORazepam (ATIVAN) 0.5 MG tablet Take by mouth. 07/22/19  Yes [provider]  pantoprazole (PROTONIX) 40 MG tablet TAKE ONE TABLET BY MOUTH DAILY 04/21/19  Yes [provider]  traZODone (DESYREL) 50 MG tablet Take by mouth. 04/27/18  Yes [provider]  acetaminophen (TYLENOL) 500 MG tablet Take 1,000-3,000 mg by mouth See admin instructions. Take 2 tablets in the morning, at lunch and take 6 tablets at bedtime    [provider]  aspirin EC 81 MG tablet Take 81 mg by mouth  daily.    [provider]  colchicine 0.6 MG tablet  08/12/19   [provider]  cyclobenzaprine (FLEXERIL) 5 MG tablet Take by mouth 3 (three) times daily as needed.  06/30/18   [provider]  D3-50 1.25 MG (50000 UT) capsule Take 50,000 Units by mouth once a week. 08/08/19   [provider]  docusate sodium (COLACE) 100 MG capsule Take 2 capsules at bedtime 07/14/18   [provider]  fluticasone (FLONASE) 50 MCG/ACT nasal spray Place 2 sprays into both nostrils 2 (two) times daily as needed.  05/03/17   [provider]  gemfibrozil (LOPID) 600 MG tablet TAKE 1 TABLET BY MOUTH IN THE MORNING. TAKE 1/2 TABLET IN THE EVENING 09/09/17   [provider]  glimepiride (AMARYL) 4 MG tablet Take 4 mg by mouth daily. 06/15/19   [provider]  lamoTRIgine (LAMICTAL) 100 MG  tablet Take 100 mg by mouth daily. 08/10/19   [provider]  lamoTRIgine (LAMICTAL) 150 MG tablet Take 150 mg by mouth daily.     [provider]  levothyroxine (SYNTHROID) 137 MCG tablet Take 137 mcg by mouth daily.  07/17/17   [provider]  lidocaine (LIDODERM) 5 % Place onto the skin. 08/07/18   [provider]  LORazepam (ATIVAN) 0.5 MG tablet Take 1 tablet (0.5 mg total) by mouth every 8 (eight) hours as needed (Dizziness). 07/21/19   Petrucelli, Samantha R, PA-C  lurasidone (LATUDA) 80 MG TABS tablet Take 80 mg by mouth daily with breakfast.    [provider]  lurasidone (LATUDA) 80 MG TABS tablet Take by mouth.    [provider]  magnesium oxide (MAG-OX) 400 (241.3 Mg) MG tablet Take by mouth 2 (two) times a day. 11/06/17   [provider]  meclizine (ANTIVERT) 25 MG tablet Take 25 mg by mouth 3 (three) times daily as needed. 06/03/19   [provider]  Melatonin 5 MG TABS Take 3 mg by mouth at bedtime.    [provider]  Multiple Vitamin (MULTIVITAMIN WITH MINERALS) TABS Take 1 tablet by mouth daily.    [provider]  Multiple Vitamins-Minerals (TAB-A-VITE) TABS Take by mouth. 11/06/17   [provider]  ondansetron (ZOFRAN-ODT) 8 MG disintegrating tablet Take by mouth. 06/24/18   [provider]  pantoprazole (PROTONIX) 40 MG tablet Take 40 mg by mouth daily.    [provider]  predniSONE (DELTASONE) 10 MG tablet TAKE 6 TABLETS BY MOUTH ONCE DAILY FOR 2 DAYS 4 TABLETS ONCE DAILY FOR 2 DAYS 2 TABLETS ONCE DAILY FOR 2 DAYS AND 1 TABLET ONCE DAILY FOR 2 08/11/19   [provider]  pregabalin (LYRICA) 150 MG capsule Take 150 mg by mouth 2 (two) times daily. 08/10/19   [provider]  pregabalin (LYRICA) 50 MG capsule Take 50 mg by mouth 2 (two) times daily.     [provider]  promethazine (PHENERGAN) 25 MG tablet Take 25 mg by mouth every 8 (eight)  hours as needed. 06/02/19   [provider]  QUEtiapine (SEROQUEL) 25 MG tablet Take 12.5 mg by mouth at bedtime.    [provider]  sulfamethoxazole-trimethoprim (BACTRIM DS) 800-160 MG tablet SMARTSIG:1 Tablet(s) By Mouth Every 12 Hours 07/29/19   [provider]  topiramate (TOPAMAX) 50 MG tablet Take 50 mg by mouth 2 (two) times daily.     [provider]  traMADol (ULTRAM) 50 MG tablet Take 50 mg by mouth  every 8 (eight) hours as needed. 07/19/19   [provider]  traZODone (DESYREL) 50 MG tablet Take by mouth. 1.5 tabs bedtime 04/27/18   [provider]  venlafaxine XR (EFFEXOR-XR) 37.5 MG 24 hr capsule Take 37.5 mg by mouth daily. 08/10/19   [provider]  venlafaxine XR (EFFEXOR-XR) 75 MG 24 hr capsule Take by mouth. 3 daily 08/13/17   [provider]  Vitamin D, Ergocalciferol, (DRISDOL) 50000 UNITS CAPS Take 50,000 Units by mouth every 7 (seven) days. On Sunday    [provider]    Allergies    Iodinated diagnostic agents, Melatonin, Phenylephrine-guaifenesin, Propoxyphene, Abilify [aripiprazole], Dhea [nutritional supplements], Dihydroergotamine, Divalproex sodium, Doxycycline, Erythromycin, Fish oil, Indomethacin, Lactose intolerance (gi), Melatonin, Risperidone and related, Shellfish allergy, Statins, Tape, Dihydroergotamine, Lactose intolerance (gi), Nsaids, and Tape  Review of Systems   Review of Systems  All other systems reviewed and are negative. Ten systems reviewed and are negative for acute change, except as noted in the HPI.   Physical Exam Updated Vital Signs BP (!) 170/107 (BP Location: Right Arm)   Pulse 100   Temp 97.9 F (36.6 C)   Resp 18   Ht 5\' 8"  (1.727 m)   Wt 78.4 kg   LMP  (LMP Unknown)   SpO2 99%   BMI 26.28 kg/m   Physical Exam Vitals and nursing note reviewed.  Constitutional:      General: She is not in acute distress.    Appearance: She is well-developed. She is  obese. She is not ill-appearing, toxic-appearing or diaphoretic.  HENT:     Head: Normocephalic and atraumatic.     Mouth/Throat:     Mouth: Mucous membranes are moist.     Pharynx: No pharyngeal swelling or oropharyngeal exudate.  Eyes:     General: No scleral icterus.    Extraocular Movements: Extraocular movements intact.     Pupils: Pupils are equal, round, and reactive to light.  Cardiovascular:     Rate and Rhythm: Normal rate and regular rhythm.     Heart sounds: Normal heart sounds. No murmur. No friction rub. No gallop.   Pulmonary:     Effort: Pulmonary effort is normal. No respiratory distress.     Breath sounds: Normal breath sounds. No stridor. No wheezing, rhonchi or rales.  Chest:     Chest wall: No tenderness.  Abdominal:     General: Abdomen is protuberant. Bowel sounds are normal. There is no distension.     Palpations: Abdomen is soft.     Tenderness: There is generalized abdominal tenderness. There is no right CVA tenderness or left CVA tenderness.  Genitourinary:    Comments: Normal-appearing vulvar anatomy.  Normal-appearing vaginal mucosa.  Small amount of white discharge noted in the vaginal vault.  Patient has a history of hysterectomy.  No cervix noted. Musculoskeletal:     Comments: Diffuse TTP noted to the right ankle.  Recent history of gout.  Skin:    General: Skin is warm and dry.     Capillary Refill: Capillary refill takes less than 2 seconds.  Neurological:     General: No focal deficit present.     Mental Status: She is alert and oriented to person, place, and time.  Psychiatric:        Mood and Affect: Mood normal.        Behavior: Behavior normal.    ED Results / Procedures / Treatments   Labs (all labs ordered are listed, but only abnormal  results are displayed) Labs Reviewed  WET PREP, GENITAL - Abnormal; Notable for the following components:      Result Value   Clue Cells Wet Prep HPF POC PRESENT (*)    All other components within  normal limits  CBC WITH DIFFERENTIAL/PLATELET - Abnormal; Notable for the following components:   HCT 34.4 (*)    Neutro Abs 8.5 (*)    Abs Immature Granulocytes 0.18 (*)    All other components within normal limits  COMPREHENSIVE METABOLIC PANEL - Abnormal; Notable for the following components:   Sodium 124 (*)    Chloride 93 (*)    CO2 17 (*)    Glucose, Bld 592 (*)    BUN 41 (*)    Creatinine, Ser 1.81 (*)    GFR calc non Af Amer 34 (*)    GFR calc Af Amer 40 (*)    All other components within normal limits  URINALYSIS, ROUTINE W REFLEX MICROSCOPIC - Abnormal; Notable for the following components:   Glucose, UA >=500 (*)    Hgb urine dipstick SMALL (*)    Protein, ur 100 (*)    All other components within normal limits  URINALYSIS, MICROSCOPIC (REFLEX) - Abnormal; Notable for the following components:   Bacteria, UA RARE (*)    All other components within normal limits  CBG MONITORING, ED - Abnormal; Notable for the following components:   Glucose-Capillary >600 (*)    All other components within normal limits  POCT I-STAT EG7 - Abnormal; Notable for the following components:   pCO2, Ven 31.0 (*)    pO2, Ven 52.0 (*)    Bicarbonate 17.6 (*)    TCO2 19 (*)    Acid-base deficit 7.0 (*)    Sodium 127 (*)    HCT 35.0 (*)    Hemoglobin 11.9 (*)    All other components within normal limits  CBG MONITORING, ED - Abnormal; Notable for the following components:   Glucose-Capillary 513 (*)    All other components within normal limits  CBG MONITORING, ED - Abnormal; Notable for the following components:   Glucose-Capillary 431 (*)    All other components within normal limits  URINE CULTURE  BLOOD GAS, VENOUS  GC/CHLAMYDIA PROBE AMP ([place]) NOT AT [facility]   EKG None  Radiology CT Renal Stone Study  Result Date: 08/12/2019 CLINICAL DATA:  44 year old female with flank pain. Concern for kidney stone. EXAM: CT ABDOMEN AND PELVIS WITHOUT CONTRAST TECHNIQUE: Multidetector CT  imaging of the abdomen and pelvis was performed following the standard protocol without IV contrast. COMPARISON:  CT abdomen pelvis dated 06/23/2018. FINDINGS: Evaluation of this exam is limited in the absence of intravenous contrast. Lower chest: The visualized lung bases are clear. No intra-abdominal free air or free fluid. Hepatobiliary: There is diffuse fatty infiltration of the liver. No intrahepatic biliary ductal dilatation. The gallbladder is unremarkable. Pancreas: Unremarkable. No pancreatic ductal dilatation or surrounding inflammatory changes. Spleen: Normal in size without focal abnormality. Adrenals/Urinary Tract: The adrenal glands are unremarkable. There is no hydronephrosis or nephrolithiasis on either side. Subcentimeter right renal hypodense lesion is not characterized. The visualized ureters and urinary bladder appear unremarkable. Stomach/Bowel: There is moderate stool throughout the colon. There is no bowel obstruction or active inflammation. The appendix is normal. Vascular/Lymphatic: The abdominal aorta and IVC are unremarkable. No portal venous gas. There is no adenopathy. Reproductive: Hysterectomy. Complex pelvic cysts, likely ovarian cysts. There is hemorrhage within the largest cystic structure in the posterior pelvis which  measures approximately 4.5 cm. Ultrasound may provide better evaluation. Other: None Musculoskeletal: No acute or significant osseous findings. IMPRESSION: 1. Bilateral ovarian cysts with acute hemorrhage within one of the cysts. Ultrasound may provide better evaluation. 2. No hydronephrosis or nephrolithiasis. 3. Fatty liver. 4. Moderate colonic stool burden. No bowel obstruction. Normal appendix. Electronically Signed   By: Anner Crete M.D.   On: 08/12/2019 22:20    Procedures Procedures   Medications Ordered in ED Medications  sodium chloride 0.9 % bolus 2,000 mL (0 mLs Intravenous Stopped 08/12/19 2306)  insulin aspart (novoLOG) injection 5 Units (5  Units Subcutaneous Given 08/12/19 2105)  insulin aspart (novoLOG) injection 8 Units (8 Units Subcutaneous Given 08/12/19 2230)  fentaNYL (SUBLIMAZE) injection 50 mcg (50 mcg Intravenous Given 08/12/19 2319)    ED Course  I have reviewed the triage vital signs and the nursing notes.  Pertinent labs & imaging results that were available during my care of the patient were reviewed by me and considered in my medical decision making (see chart for details).    MDM Rules/Calculators/A&P                      Patient is a 44 year old female with a significant medical history listed above.  She does have a history of diabetes mellitus.  She was recently placed on prednisone for gout in her right ankle.  Upon arrival her CBG was over 600.  Her creatinine is up at 1.81 from 1.58, 3 weeks ago.  She has been given IV fluids and insulin and her most recent CBG was 513.  Patient complains of 3 days of constipation and diffuse abdominal pain for the last 24 hours.  She has used an enema and multiple laxatives with no relief.  CT scan of the abdomen shows bilateral ovarian cyst with acute hemorrhage within one of the cyst.  Radiology recommends ultrasound to provide better evaluation.  No obstruction.  I performed a pelvic exam showing small amount of white discharge.  Patient has a history of hysterectomy and there was no cervix in place.  Wet prep and GC/chlamydia sent.   I discussed patient with attending physician Dr. Addison Lank at [facility] who will accept her.  I put in an order for an ultrasound and will transfer patient now.  Final Clinical Impression(s) / ED Diagnoses Final diagnoses:  Generalized abdominal pain  Hyperglycemia due to diabetes mellitus ([place])  Ruptured ovarian cyst    Rx / DC Orders ED Discharge Orders    None       Rayna Sexton, PA-C 08/12/19 2356    Drenda Freeze, MD 08/14/19 1459

## 2019-08-13 ENCOUNTER — Emergency Department (HOSPITAL_COMMUNITY): Payer: Medicare Other

## 2019-08-13 DIAGNOSIS — R1084 Generalized abdominal pain: Secondary | ICD-10-CM | POA: Diagnosis not present

## 2019-08-13 LAB — CBG MONITORING, ED: Glucose-Capillary: 337 mg/dL — ABNORMAL HIGH (ref 70–99)

## 2019-08-13 MED ORDER — KETOROLAC TROMETHAMINE 15 MG/ML IJ SOLN
7.5000 mg | Freq: Once | INTRAMUSCULAR | Status: AC
  Administered 2019-08-13: 7.5 mg via INTRAVENOUS
  Filled 2019-08-13: qty 1

## 2019-08-13 MED ORDER — HYDROMORPHONE HCL 1 MG/ML IJ SOLN
0.5000 mg | Freq: Once | INTRAMUSCULAR | Status: AC
  Administered 2019-08-13: 0.5 mg via INTRAVENOUS
  Filled 2019-08-13: qty 1

## 2019-08-13 MED ORDER — SODIUM CHLORIDE 0.9 % IV BOLUS
1000.0000 mL | Freq: Once | INTRAVENOUS | Status: AC
  Administered 2019-08-13: 1000 mL via INTRAVENOUS

## 2019-08-13 MED ORDER — ONDANSETRON HCL 4 MG/2ML IJ SOLN
4.0000 mg | Freq: Once | INTRAMUSCULAR | Status: AC
  Administered 2019-08-13: 4 mg via INTRAVENOUS
  Filled 2019-08-13: qty 2

## 2019-08-13 MED ORDER — FENTANYL CITRATE (PF) 100 MCG/2ML IJ SOLN
50.0000 ug | Freq: Once | INTRAMUSCULAR | Status: AC
  Administered 2019-08-13: 50 ug via INTRAVENOUS
  Filled 2019-08-13: qty 2

## 2019-08-13 NOTE — ED Notes (Signed)
Pt transported to Ultrasound.  

## 2019-08-13 NOTE — ED Notes (Signed)
Pt ambulated well to BR with walker

## 2019-08-14 LAB — URINE CULTURE: Culture: <10000 — AB

## 2019-08-16 LAB — GC/CHLAMYDIA PROBE AMP (~~LOC~~) NOT AT ARMC
Chlamydia: NEGATIVE
Comment: NEGATIVE
Comment: NORMAL
Neisseria Gonorrhea: NEGATIVE

## 2019-08-16 LAB — CBG MONITORING, ED: Glucose-Capillary: 391 mg/dL — ABNORMAL HIGH (ref 70–99)

## 2019-08-21 HISTORY — PX: OOPHORECTOMY: SHX86

## 2019-09-16 ENCOUNTER — Encounter: Payer: Medicare Other | Admitting: Obstetrics & Gynecology

## 2019-09-17 ENCOUNTER — Ambulatory Visit: Payer: Medicare Other | Admitting: Cardiology

## 2019-09-17 ENCOUNTER — Other Ambulatory Visit: Payer: Self-pay

## 2019-09-17 ENCOUNTER — Encounter: Payer: Self-pay | Admitting: Cardiology

## 2019-09-17 VITALS — BP 142/98 | HR 116 | Ht 68.0 in | Wt 169.0 lb

## 2019-09-17 DIAGNOSIS — F5081 Binge eating disorder: Secondary | ICD-10-CM

## 2019-09-17 DIAGNOSIS — N1832 Chronic kidney disease, stage 3b: Secondary | ICD-10-CM

## 2019-09-17 DIAGNOSIS — I10 Essential (primary) hypertension: Secondary | ICD-10-CM

## 2019-09-17 DIAGNOSIS — F50819 Binge eating disorder, unspecified: Secondary | ICD-10-CM

## 2019-09-17 DIAGNOSIS — E781 Pure hyperglyceridemia: Secondary | ICD-10-CM

## 2019-09-17 MED ORDER — ATENOLOL 50 MG PO TABS: 50.0000 mg | ORAL_TABLET | Freq: Every day | ORAL | 2 refills | Status: DC

## 2019-09-17 NOTE — Progress Notes (Signed)
Primary Physician/Referring:  Bernerd Limbo, MD  Patient ID: Brandi Love, female    DOB: Sep 05, 1975, 44 y.o.   MRN: [id number]  Chief Complaint  Patient presents with  . Chest Pain  . Follow-up    1 year   HPI:    Brandi Love  is a 44 y.o. female   with eating disorder, depression with psychosis, borderline personality disorder, OCD, hypothyroidism, uncontrolled  DM,  deafness with cochlear implants, and chronic kidney disease stage III, and is attributed to NSAID use and also patient once try to commit suicide by consuming excessive amounts of aspirin in the remote past. She underwent right salpingo-oophorectomy on 09/02/2019 without periprocedural cardiac complications and found to have benign tumor.  She has had multiple hospital admissions for eating disorder and also for depression.  She has noticed her blood pressure to be elevated.  Fortunately she has not had any chest pain.  She does admit to not cooking but eats mostly frozen food.   She is being followed at the [facility] for hyperglycemia and also for lipids but also sees me from cardiac standpoint for chest pain and hyperlipidemia.  Past Medical History:  Diagnosis Date  . Anemia   . Anorexia   . Anxiety   . Arthritis    "hands" (09/18/2016)  . Chronic kidney disease   . Chronic lower back pain   . Chronic renal insufficiency   . Deaf   . Deafness   . Depression   . Diabetes ([place])    "borderline"  . Eating disorder   . Endometriosis   . GERD (gastroesophageal reflux disease)   . History of kidney stones   . Hyperlipidemia   . 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 MDD (major depressive disorder)   . Migraine    "frequency varies" (09/18/2016)  . OCD (obsessive compulsive disorder)   . Pneumonia 2011  . PONV (postoperative nausea and vomiting)   . Pulmonary embolism ([place]) 10/2001  . Seizures ([place]) 09/2001 X 1   "day after my hysterectomy"  . Seizures ([place])   . Self  mutilating behavior   . Tachycardia    Past Surgical History:  Procedure Laterality Date  . ABDOMINAL HYSTERECTOMY  09/2001   'except for right ovary'  . BREAST LUMPECTOMY Left 2000   benign  . BREAST LUMPECTOMY Right 2002   benign  . COCHLEAR IMPLANT Bilateral 2004-2010   "right-left"  . COLONOSCOPY  06/2015    Dr Rolm Bookbinder at [place] for rectal bleeding: internal hemorrhoids, small anal fissure.    . ESOPHAGOGASTRODUODENOSCOPY N/A 10/07/2016   Procedure: ESOPHAGOGASTRODUODENOSCOPY (EGD);  Surgeon: Ladene Artist, MD;  Location: [facility] ENDOSCOPY;  Service: Endoscopy;  Laterality: N/A;  . EYE MUSCLE SURGERY Left 1980   "lazy eye"  . EYE MUSCLE SURGERY Right 1990   "lazy eye"  . LAPAROSCOPIC ABDOMINAL EXPLORATION  2001 and 2002   for endometriosis   Family History  Problem Relation Age of Onset  . Cancer Father   . Hyperlipidemia Father   . Osteoarthritis Mother   . Hyperlipidemia Mother     Social History   Tobacco Use  . Smoking status: Never Smoker  . Smokeless tobacco: Never Used  Substance Use Topics  . Alcohol use: Never   Marital Status: Single  ROS  Review of Systems  Cardiovascular: Negative for chest pain, dyspnea on exertion and leg swelling.  Gastrointestinal: Negative for melena.   Objective  Blood  pressure (!) 142/98, pulse (!) 116, height '5\' 8"'  (1.727 m), weight 169 lb (76.7 kg), SpO2 95 %.  Vitals with BMI 09/17/2019 08/13/2019 08/13/2019  Height '5\' 8"'  - -  Weight 169 lbs - -  BMI 19.6 - -  Systolic 222 979 892  Diastolic 98 119 417  Pulse 116 98 103     Physical Exam  Cardiovascular: Normal rate, regular rhythm, normal heart sounds and intact distal pulses. Exam reveals no gallop.  No murmur heard. No leg edema, no JVD.  Pulmonary/Chest: Effort normal and breath sounds normal.  Abdominal: Soft. Bowel sounds are normal.   Laboratory examination:   Recent Labs    03/03/19 0849 03/03/19 0849 07/21/19 1546 08/12/19 2055 08/12/19 2056    NA 137   < > 132* 124* 127*  K 4.4   < > 5.0 4.7 4.9  CL 106  --  100 93*  --   CO2 19*  --  20* 17*  --   GLUCOSE 205*  --  294* 592*  --   BUN 16  --  32* 41*  --   CREATININE 1.53*  --  1.58* 1.81*  --   CALCIUM 10.0  --  9.8 9.5  --   GFRNONAA 42*  --  40* 34*  --   GFRAA 49*  --  47* 40*  --    < > = values in this interval not displayed.   CrCl cannot be calculated (Patient's most recent lab result is older than the maximum 21 days allowed.).  CMP Latest Ref Rng & Units 08/12/2019 08/12/2019 07/21/2019  Glucose 70 - 99 mg/dL - 592(HH) 294(H)  BUN 6 - 20 mg/dL - 41(H) 32(H)  Creatinine 0.44 - 1.00 mg/dL - 1.81(H) 1.58(H)  Sodium 135 - 145 mmol/L 127(L) 124(L) 132(L)  Potassium 3.5 - 5.1 mmol/L 4.9 4.7 5.0  Chloride 98 - 111 mmol/L - 93(L) 100  CO2 22 - 32 mmol/L - 17(L) 20(L)  Calcium 8.9 - 10.3 mg/dL - 9.5 9.8  Total Protein 6.5 - 8.1 g/dL - 7.3 6.4(L)  Total Bilirubin 0.3 - 1.2 mg/dL - 0.5 0.3  Alkaline Phos 38 - 126 U/L - 104 89  AST 15 - 41 U/L - 15 25  ALT 0 - 44 U/L - 18 26   CBC Latest Ref Rng & Units 08/12/2019 08/12/2019 07/21/2019  WBC 4.0 - 10.5 K/uL - 9.9 6.8  Hemoglobin 12.0 - 15.0 g/dL 11.9(L) 12.4 13.1  Hematocrit 36.0 - 46.0 % 35.0(L) 34.4(L) 37.0  Platelets 150 - 400 K/uL - 285 257   Lipid Panel  No results found for: CHOL, TRIG, HDL, CHOLHDL, VLDL, LDLCALC, LDLDIRECT HEMOGLOBIN A1C Lab Results  Component Value Date   HGBA1C 5.5 10/10/2016   MPG 111 10/10/2016   TSH Recent Labs    03/02/19 1526  TSH 1.258    External labs:   07/15/2019:  A1c 10.2%.  Hb 11.8/HCT 34.0, platelets 253.  Sodium 134, potassium 4.9, serum glucose 209 mg, BUN 26, creatinine 0.87, EGFR 33 mL.  Uric acid elevated at 10.3.  Labs 05/05/2018: Direct LDL 116.  HDL 32.  Total cholesterol 380. Triglycerides 1386.  BUN 36, creatinine 2.3, eGFR 26 mL.  TSH normal.  Labs 08/31/2018: Serum glucose 125m, BUN 23, creatinine 1.77, eGFR 35 well.  Potassium 5.5.  CMP otherwise  normal.  Medications and allergies   Allergies  Allergen Reactions  . Fish Oil Anaphylaxis    Facial edema, itching  . Iodinated  Diagnostic Agents Anaphylaxis    'cant breathe'  . Melatonin Itching  . Other Itching, Swelling, Rash and Anaphylaxis    Unable to take due to renal insufficency Unable to ttake due to renal insufficency Eyes swell 'cant breathe' 'cant breathe' 'cant breathe' 'cant breathe' Other reaction(s): Other (See Comments) Patient stated she can not take because she has renal insufficiency Unable to ttake due to renal insufficency 'cant breathe'  . Phenylephrine-Guaifenesin Shortness Of Breath and Swelling    Throat swelling  . Propoxyphene Anaphylaxis  . Abilify [Aripiprazole] Nausea And Vomiting  . Dhea [Nutritional Supplements] Nausea And Vomiting and Swelling  . Dihydroergotamine Nausea And Vomiting  . Divalproex Sodium Nausea And Vomiting and Other (See Comments)    Weight gain  . Doxycycline Nausea And Vomiting  . Erythromycin Nausea And Vomiting  . Indomethacin Nausea And Vomiting  . Lactose Intolerance (Gi) Nausea And Vomiting  . Melatonin Itching  . Risperidone And Related Other (See Comments)    Out of body feeling  . Shellfish Allergy Itching and Swelling    Eyes swell  . Statins Other (See Comments)    Cannot tolerate  . Tape Other (See Comments)    Redness/ please use paper tape  . Dihydroergotamine Nausea And Vomiting  . Lactose Intolerance (Gi) Nausea And Vomiting  . Nsaids Other (See Comments)    Unable to ttake due to renal insufficency  . Sulfa Antibiotics Itching    Redness/ please use paper tape Redness, can use paper tape  . Tape Other (See Comments)    Redness, can use paper tape     Current Outpatient Medications  Medication Instructions  . acetaminophen (TYLENOL) 1,000-3,000 mg, Oral, See admin instructions, Take 2 tablets in the morning, at lunch and take 6 tablets at bedtime  . allopurinol (ZYLOPRIM) 300 MG tablet  Oral  . aspirin EC 81 mg, Oral, Daily  . atenolol (TENORMIN) 50 mg, Oral, Daily  . cyclobenzaprine (FLEXERIL) 5 MG tablet Oral, 3 times daily PRN  . D3-50 50,000 Units, Oral, Weekly  . docusate sodium (COLACE) 100 MG capsule Take 2 capsules at bedtime  . Dulaglutide 0.75 MG/0.5ML SOPN Subcutaneous  . estrogens, conjugated, (PREMARIN) 0.625 MG tablet Oral  . fluticasone (FLONASE) 50 MCG/ACT nasal spray 2 sprays, Each Nare, 2 times daily PRN  . gemfibrozil (LOPID) 600 MG tablet 600 mg in the morning and 300 mg in the evening  . glimepiride (AMARYL) 4 mg, Oral, Daily  . levothyroxine (SYNTHROID) 137 mcg, Oral, Daily  . lurasidone (LATUDA) 40 mg, Oral, Daily with breakfast  . magnesium oxide (MAG-OX) 400 (241.3 Mg) MG tablet Oral, 2 times daily  . meclizine (ANTIVERT) 25 mg, Oral, 3 times daily PRN  . melatonin 3 mg, Oral, Daily at bedtime  . Multiple Vitamin (MULTIVITAMIN WITH MINERALS) TABS 1 tablet, Oral, Daily  . ondansetron (ZOFRAN-ODT) 8 MG disintegrating tablet Oral  . pantoprazole (PROTONIX) 40 mg, Oral, Daily  . pregabalin (LYRICA) 150 mg, Oral, 2 times daily  . promethazine (PHENERGAN) 25 mg, Oral, Every 8 hours PRN  . traMADol (ULTRAM) 100 mg, Oral, Every 8 hours PRN   Radiology:   No results found.  Cardiac Studies:   Echocardiogram 09/30/2016: Normal LV size, normal LV systolic function, EF 01-77% without regional wall motion abnormality.  Stress test- 10/13/12 Negative for ischemia, there was no significant ST.   depression c.f. resting EKG. Walked for 7 minutes.   Peak HR- 174/min ( 93% MPHR), achieved 8.2 METS.  EKG  EKG  09/17/2019: Sinus tachycardia at the rate of 105 bpm, normal axis, poor R wave progression, probably normal variant.  No evidence of ischemia.  No significant change from prior EKG.  Assessment     ICD-10-CM   1. Acquired hypertriglyceridemia  E78.1   2. Primary hypertension  I10 EKG 12-Lead    atenolol (TENORMIN) 50 MG tablet  3. Binge eating  disorder  F50.81   4. Stage 3b chronic kidney disease  N18.32      Meds ordered this encounter  Medications  . atenolol (TENORMIN) 50 MG tablet    Sig: Take 1 tablet (50 mg total) by mouth daily.    Dispense:  30 tablet    Refill:  2    Medications Discontinued During This Encounter  Medication Reason  . topiramate (TOPAMAX) 50 MG tablet Discontinued by provider  . QUEtiapine (SEROQUEL) 25 MG tablet Discontinued by provider  . lidocaine (LIDODERM) 5 % No longer needed (for PRN medications)  . colchicine 0.6 MG tablet Discontinued by provider  . cyclobenzaprine (FLEXERIL) 5 MG tablet Duplicate  . ergocalciferol (VITAMIN D2) 1.25 MG (44010 UT) capsule Duplicate  . gemfibrozil (LOPID) 600 MG tablet Change in therapy  . glimepiride (AMARYL) 4 MG tablet Duplicate  . lamoTRIgine (LAMICTAL) 100 MG tablet Discontinued by provider  . lamoTRIgine (LAMICTAL) 150 MG tablet Discontinued by provider  . levothyroxine (SYNTHROID) 272 MCG tablet Duplicate  . LORazepam (ATIVAN) 0.5 MG tablet Completed Course  . LORazepam (ATIVAN) 0.5 MG tablet Completed Course  . lurasidone (LATUDA) 80 MG TABS tablet Discontinued by provider  . lurasidone (LATUDA) 80 MG TABS tablet Discontinued by provider  . Multiple Vitamins-Minerals (TAB-A-VITE) TABS Patient Preference  . pantoprazole (PROTONIX) 40 MG tablet Duplicate  . predniSONE (DELTASONE) 10 MG tablet Discontinued by provider  . pregabalin (LYRICA) 50 MG capsule Dose change  . sulfamethoxazole-trimethoprim (BACTRIM DS) 800-160 MG tablet Completed Course  . traZODone (DESYREL) 50 MG tablet Discontinued by provider  . traZODone (DESYREL) 50 MG tablet Discontinued by provider  . venlafaxine XR (EFFEXOR-XR) 37.5 MG 24 hr capsule Discontinued by provider  . venlafaxine XR (EFFEXOR-XR) 75 MG 24 hr capsule Discontinued by provider  . Vitamin D, Ergocalciferol, (DRISDOL) 53664 UNITS CAPS Duplicate    Recommendations:   Brandi Love  is a 44 y.o.  female    with eating disorder, depression with psychosis, borderline personality disorder, OCD, hypothyroidism, uncontrolled  DM,  deafness with cochlear implants, and chronic kidney disease stage III, and is attributed to NSAID use and also patient once try to commit suicide by consuming excessive amounts of aspirin in the remote past. She underwent right salpingo-oophorectomy on 09/02/2019 without periprocedural cardiac complications and found to have benign tumor.  She has had multiple hospital admissions for eating disorder and also for depression.  She has noticed her blood pressure to be elevated.  She is also tachycardic and this is chronic.  I have added atenolol 50 mg daily and have like to see her back in 6 to 8 weeks for follow-up of hypertension.  With regard to hypertriglyceridemia, she is being evaluated at [facility], she has allergy to shellfish and is unable to take any kind of omega-3 fatty acids.  Extremely difficult situation especially in view of her underlying psychiatric issues and patient not cooking and essentially eating frozen dinners mostly.  She now has uncontrolled diabetes as well.  With regard to renal function, it has remained stable.  I evaluated her recent labs and updated them.  Ulice Dash  Einar Gip, MD, [facility] 09/17/2019, 2:25 PM [facility] [place] Office: [phone]

## 2019-11-06 ENCOUNTER — Other Ambulatory Visit: Payer: Self-pay

## 2019-11-06 ENCOUNTER — Encounter (HOSPITAL_BASED_OUTPATIENT_CLINIC_OR_DEPARTMENT_OTHER): Payer: Self-pay | Admitting: *Deleted

## 2019-11-06 ENCOUNTER — Emergency Department (HOSPITAL_BASED_OUTPATIENT_CLINIC_OR_DEPARTMENT_OTHER)
Admission: EM | Admit: 2019-11-06 | Discharge: 2019-11-07 | Disposition: A | Discharge status: Home / Self Care | Payer: Medicare Other | Attending: Emergency Medicine | Admitting: Emergency Medicine

## 2019-11-06 ENCOUNTER — Emergency Department (HOSPITAL_BASED_OUTPATIENT_CLINIC_OR_DEPARTMENT_OTHER): Payer: Medicare Other

## 2019-11-06 DIAGNOSIS — R1013 Epigastric pain: Secondary | ICD-10-CM | POA: Insufficient documentation

## 2019-11-06 DIAGNOSIS — Z79899 Other long term (current) drug therapy: Secondary | ICD-10-CM | POA: Insufficient documentation

## 2019-11-06 DIAGNOSIS — I129 Hypertensive chronic kidney disease with stage 1 through stage 4 chronic kidney disease, or unspecified chronic kidney disease: Secondary | ICD-10-CM | POA: Insufficient documentation

## 2019-11-06 DIAGNOSIS — N183 Chronic kidney disease, stage 3 unspecified: Secondary | ICD-10-CM | POA: Insufficient documentation

## 2019-11-06 DIAGNOSIS — Z7982 Long term (current) use of aspirin: Secondary | ICD-10-CM | POA: Diagnosis not present

## 2019-11-06 DIAGNOSIS — E039 Hypothyroidism, unspecified: Secondary | ICD-10-CM | POA: Insufficient documentation

## 2019-11-06 DIAGNOSIS — R197 Diarrhea, unspecified: Secondary | ICD-10-CM | POA: Insufficient documentation

## 2019-11-06 DIAGNOSIS — R112 Nausea with vomiting, unspecified: Secondary | ICD-10-CM

## 2019-11-06 LAB — URINALYSIS, ROUTINE W REFLEX MICROSCOPIC
Bilirubin Urine: NEGATIVE
Glucose, UA: NEGATIVE mg/dL
Ketones, ur: NEGATIVE mg/dL
Leukocytes,Ua: NEGATIVE
Nitrite: NEGATIVE
Protein, ur: >300 mg/dL — AB
Specific Gravity, Urine: 1.025 (ref 1.005–1.030)
pH: 6 (ref 5.0–8.0)

## 2019-11-06 LAB — CBC WITH DIFFERENTIAL/PLATELET
Abs Immature Granulocytes: 0.05 10*3/uL (ref 0.00–0.07)
Basophils Absolute: 0.1 10*3/uL (ref 0.0–0.1)
Basophils Relative: 1 %
Eosinophils Absolute: 0.2 10*3/uL (ref 0.0–0.5)
Eosinophils Relative: 2 %
HCT: 36.8 % (ref 36.0–46.0)
Hemoglobin: 13 g/dL (ref 12.0–15.0)
Immature Granulocytes: 1 %
Lymphocytes Relative: 32 %
Lymphs Abs: 2.8 10*3/uL (ref 0.7–4.0)
MCH: 31.4 pg (ref 26.0–34.0)
MCHC: 35.3 g/dL (ref 30.0–36.0)
MCV: 88.9 fL (ref 80.0–100.0)
Monocytes Absolute: 0.6 10*3/uL (ref 0.1–1.0)
Monocytes Relative: 6 %
Neutro Abs: 5.1 10*3/uL (ref 1.7–7.7)
Neutrophils Relative %: 58 %
Platelets: 282 10*3/uL (ref 150–400)
RBC: 4.14 MIL/uL (ref 3.87–5.11)
RDW: 14.1 % (ref 11.5–15.5)
WBC: 8.8 10*3/uL (ref 4.0–10.5)
nRBC: 0 % (ref 0.0–0.2)

## 2019-11-06 LAB — COMPREHENSIVE METABOLIC PANEL
ALT: 10 U/L (ref 0–44)
AST: 13 U/L — ABNORMAL LOW (ref 15–41)
Albumin: 3.3 g/dL — ABNORMAL LOW (ref 3.5–5.0)
Alkaline Phosphatase: 89 U/L (ref 38–126)
Anion gap: 13 (ref 5–15)
BUN: 31 mg/dL — ABNORMAL HIGH (ref 6–20)
CO2: 20 mmol/L — ABNORMAL LOW (ref 22–32)
Calcium: 9.9 mg/dL (ref 8.9–10.3)
Chloride: 100 mmol/L (ref 98–111)
Creatinine, Ser: 1.67 mg/dL — ABNORMAL HIGH (ref 0.44–1.00)
GFR calc Af Amer: 44 mL/min — ABNORMAL LOW (ref >60)
GFR calc non Af Amer: 38 mL/min — ABNORMAL LOW (ref >60)
Glucose, Bld: 74 mg/dL (ref 70–99)
Potassium: 4.3 mmol/L (ref 3.5–5.1)
Sodium: 133 mmol/L — ABNORMAL LOW (ref 135–145)
Total Bilirubin: 0.3 mg/dL (ref 0.3–1.2)
Total Protein: 6.8 g/dL (ref 6.5–8.1)

## 2019-11-06 LAB — LIPASE, BLOOD: Lipase: 36 U/L (ref 11–51)

## 2019-11-06 LAB — URINALYSIS, MICROSCOPIC (REFLEX): WBC, UA: NONE SEEN WBC/hpf (ref 0–5)

## 2019-11-06 MED ORDER — LACTATED RINGERS IV BOLUS
1000.0000 mL | Freq: Once | INTRAVENOUS | Status: AC
  Administered 2019-11-06: 1000 mL via INTRAVENOUS

## 2019-11-06 MED ORDER — MORPHINE SULFATE (PF) 4 MG/ML IV SOLN
4.0000 mg | Freq: Once | INTRAVENOUS | Status: AC
  Administered 2019-11-06: 4 mg via INTRAVENOUS
  Filled 2019-11-06: qty 1

## 2019-11-06 MED ORDER — ONDANSETRON HCL 4 MG/2ML IJ SOLN
4.0000 mg | Freq: Once | INTRAMUSCULAR | Status: AC
  Administered 2019-11-06: 4 mg via INTRAVENOUS
  Filled 2019-11-06: qty 2

## 2019-11-06 MED ORDER — METOCLOPRAMIDE HCL 5 MG/ML IJ SOLN
10.0000 mg | Freq: Once | INTRAMUSCULAR | Status: AC
  Administered 2019-11-06: 10 mg via INTRAVENOUS
  Filled 2019-11-06: qty 2

## 2019-11-06 MED ORDER — LIDOCAINE VISCOUS HCL 2 % MT SOLN
15.0000 mL | Freq: Once | OROMUCOSAL | Status: AC
  Administered 2019-11-06: 15 mL via ORAL
  Filled 2019-11-06: qty 15

## 2019-11-06 MED ORDER — ALUM & MAG HYDROXIDE-SIMETH 200-200-20 MG/5ML PO SUSP
30.0000 mL | Freq: Once | ORAL | Status: AC
  Administered 2019-11-06: 30 mL via ORAL
  Filled 2019-11-06: qty 30

## 2019-11-06 NOTE — ED Notes (Signed)
Patient transported to CT 

## 2019-11-06 NOTE — ED Notes (Addendum)
PT IS DEAF, ABLE TO READ LIPS

## 2019-11-06 NOTE — ED Triage Notes (Signed)
Pt reports severe abdominal pain x 1 week. States she has had upper abdominal pain, n/v/d since last week. She saw her endocrinologist last week and her TSH and triglycerides are very elevated. Pt reads lips

## 2019-11-06 NOTE — ED Notes (Signed)
Appears comfortable , states feels some better. Nausea is decreased, informed pt that her doctor would like a urine spec, nurse call bell given to pt

## 2019-11-06 NOTE — ED Notes (Signed)
Here for abdominal pain, states she went to [facility] and saw her Endocrinologist for her MD appt and had lab work done. States abd pain began approx 1 week ago, having N/V/ and D. Now has generalized body aches. States she has a hx of thyroid dz.

## 2019-11-06 NOTE — ED Notes (Signed)
Pt states she still unable to void at this time for urine spec

## 2019-11-06 NOTE — ED Notes (Signed)
Provider at the bedside.  

## 2019-11-06 NOTE — ED Provider Notes (Signed)
[facility] EMERGENCY DEPARTMENT Provider Note   CSN: [id number] Arrival date & time: 11/06/19  1732     History Chief Complaint  Patient presents with  . Abdominal Pain    Brandi Love is a 44 y.o. female.  Patient is a 44 year old female with a history of multiple medical problems including congenital deafness, chronic kidney disease, hypothyroidism and from birth, recent pelvic surgery with ovarian cyst and torsion, persistent hyper triglyceridemia and prior history of abdominal pain who presents today with 2 weeks of intermittent abdominal pain that is worsened over the last 2 days and become more constant. She reports she has had intermittent vomiting but today has had constant nausea, one episode of vomiting and is not able to eat due to the nausea. Patient's pain is in the upper abdomen and states when she tries to eat it makes it worse but movement and palpation does not seem to make it worse. She denies any urinary symptoms and has had one episode of diarrhea. Patient has not taken anything specifically for this discomfort. Patient has seen her endocrinologist and PCP from [place] within the last week and did have elevated TSH from baseline and elevated triglycerides from baseline despite taking her prescribed medications. They have recently adjusted the medications but she has not started taking them yet. She denies any cough or shortness of breath today.  The history is provided by the patient.  Abdominal Pain      Past Medical History:  Diagnosis Date  . Anemia   . Anorexia   . Anxiety   . Arthritis    "hands" (09/18/2016)  . Chronic kidney disease   . Chronic lower back pain   . Chronic renal insufficiency   . Deaf   . Deafness   . Depression   . Diabetes ([place])    "borderline"  . Eating disorder   . Endometriosis   . GERD (gastroesophageal reflux disease)   . History of kidney stones   . Hyperlipidemia   . Hypothyroidism    "born without a thyroid  gland"  . MDD (major depressive disorder)   . Migraine    "frequency varies" (09/18/2016)  . OCD (obsessive compulsive disorder)   . Pneumonia 2011  . PONV (postoperative nausea and vomiting)   . Pulmonary embolism ([place]) 10/2001  . Seizures ([place]) 09/2001 X 1   "day after my hysterectomy"  . Seizures ([place])   . Self mutilating behavior   . Tachycardia     Patient Active Problem List   Diagnosis Date Noted  . Deaf 10/01/2018  . Hematochezia 10/01/2018  . Hypertension 10/01/2018  . Loss of appetite 10/01/2018  . Mental health problem 10/01/2018  . Seizures ([place]) 10/01/2018  . Abdominal pain 07/02/2018  . Gastroesophageal reflux disease without esophagitis 02/16/2018  . Primary insomnia 02/16/2018  . Prediabetes 01/28/2018  . Type 2 diabetes mellitus without complication, without long-term current use of insulin ([place]) 01/28/2018  . Syncope 08/26/2017  . Acute renal failure superimposed on stage 3 chronic kidney disease ([place]) 08/26/2017  . Orthostatic syncope   . Self mutilating behavior 08/08/2017  . Acne 06/11/2017  . Self-care deficit for bathing 06/11/2017  . Urinary tract infection with hematuria   . OCD (obsessive compulsive disorder) 09/29/2016  . Anorexia nervosa with bulimia 09/29/2016  . Deafness congenital 09/29/2016  . Intractable nausea and vomiting 09/29/2016  . Bloody diarrhea 09/29/2016  . AKI (acute kidney injury) ([place]) 09/29/2016  . Severe dehydration 09/29/2016  . Severe protein-calorie  malnutrition ([place]) 09/29/2016  . Congenital hypothyroidism 09/29/2016  . Hypertriglyceridemia 09/29/2016  . Anemia 09/29/2016  . Endometriosis 09/29/2016  . MDD (major depressive disorder) 09/29/2016  . Hyperlipidemia 09/29/2016  . CKD (chronic kidney disease), stage III ([place]) 09/29/2016  . Acute kidney injury ([place]) 09/29/2016  . OAB (overactive bladder) 09/29/2016  . Sepsis ([place]) 09/18/2016  . Acute pyelonephritis 09/18/2016  . Urinary incontinence 08/09/2015  . MDD  (major depressive disorder), recurrent, severe, with psychosis ([place]) 12/11/2014  . Severe recurrent major depressive disorder with psychotic symptoms ([place]) 12/11/2014  . Migraine 07/02/2013  . Eating disorder, unspecified 11/23/2012  . Hypothyroidism 11/23/2012  . Dyslipidemia 11/23/2012  . Sinus tachycardia 11/23/2012  . CKD (chronic kidney disease) stage 3, GFR 30-59 ml/min 11/18/2012  . Protein-calorie malnutrition, severe ([place]) 11/13/2012  . Abdominal pain, epigastric 11/12/2012  . Dehydration 11/12/2012  . Acute on chronic renal failure ([place]) 11/12/2012  . Leukocytosis, unspecified 11/12/2012  . Hyponatremia 11/12/2012  . Borderline personality disorder ([place]) 11/12/2012    Past Surgical History:  Procedure Laterality Date  . ABDOMINAL HYSTERECTOMY  09/2001   'except for right ovary'  . BREAST LUMPECTOMY Left 2000   benign  . BREAST LUMPECTOMY Right 2002   benign  . COCHLEAR IMPLANT Bilateral 2004-2010   "right-left"  . COLONOSCOPY  06/2015    Dr Rolm Bookbinder at [facility] for rectal bleeding: internal hemorrhoids, small anal fissure.    . ESOPHAGOGASTRODUODENOSCOPY N/A 10/07/2016   Procedure: ESOPHAGOGASTRODUODENOSCOPY (EGD);  Surgeon: Ladene Artist, MD;  Location: [facility] ENDOSCOPY;  Service: Endoscopy;  Laterality: N/A;  . EYE MUSCLE SURGERY Left 1980   "lazy eye"  . EYE MUSCLE SURGERY Right 1990   "lazy eye"  . LAPAROSCOPIC ABDOMINAL EXPLORATION  2001 and 2002   for endometriosis  . OOPHORECTOMY Right 08/2019     OB History   No obstetric history on file.     Family History  Problem Relation Age of Onset  . Cancer Father   . Hyperlipidemia Father   . Osteoarthritis Mother   . Hyperlipidemia Mother     Social History   Tobacco Use  . Smoking status: Never Smoker  . Smokeless tobacco: Never Used  Vaping Use  . Vaping Use: Never used  Substance Use Topics  . Alcohol use: Never  . Drug use: Never    Home Medications Prior to Admission medications     Medication Sig Start Date End Date Taking? Authorizing Provider  acetaminophen (TYLENOL) 500 MG tablet Take 1,000-3,000 mg by mouth See admin instructions. Take 2 tablets in the morning, at lunch and take 6 tablets at bedtime    [provider]  allopurinol (ZYLOPRIM) 300 MG tablet Take by mouth. 08/12/19   [provider]  aspirin EC 81 MG tablet Take 81 mg by mouth daily.    [provider]  atenolol (TENORMIN) 50 MG tablet Take 1 tablet (50 mg total) by mouth daily. 09/17/19 12/16/19  Adrian Prows, MD  cyclobenzaprine (FLEXERIL) 5 MG tablet Take by mouth 3 (three) times daily as needed.  06/30/18   [provider]  D3-50 1.25 MG (50000 UT) capsule Take 50,000 Units by mouth once a week. 08/08/19   [provider]  docusate sodium (COLACE) 100 MG capsule Take 2 capsules at bedtime 07/14/18   [provider]  Dulaglutide 0.75 MG/0.5ML SOPN Inject into the skin. 08/19/19   [provider]  estrogens, conjugated, (PREMARIN) 0.625 MG tablet Take by mouth. 09/16/19   [provider]  fluticasone (FLONASE) 50 MCG/ACT nasal spray Place 2 sprays into both nostrils 2 (two) times daily as needed.  05/03/17   [provider]  gemfibrozil (LOPID) 600 MG tablet 600 mg in the morning and 300 mg in the evening 09/09/17   [provider]  glimepiride (AMARYL) 4 MG tablet Take 4 mg by mouth daily. 06/15/19   [provider]  levothyroxine (SYNTHROID) 137 MCG tablet Take 137 mcg by mouth daily.  07/17/17   [provider]  lurasidone (LATUDA) 40 MG TABS tablet Take 40 mg by mouth daily with breakfast.    [provider]  magnesium oxide (MAG-OX) 400 (241.3 Mg) MG tablet Take by mouth 2 (two) times a day. 11/06/17   [provider]  meclizine (ANTIVERT) 25 MG tablet Take 25 mg by mouth 3 (three) times daily as needed. 06/03/19   [provider]  Melatonin 5 MG TABS Take 3 mg by mouth at bedtime.     [provider]  Multiple Vitamin (MULTIVITAMIN WITH MINERALS) TABS Take 1 tablet by mouth daily.    [provider]  ondansetron (ZOFRAN-ODT) 8 MG disintegrating tablet Take by mouth. 06/24/18   [provider]  pantoprazole (PROTONIX) 40 MG tablet Take 40 mg by mouth daily.    [provider]  pregabalin (LYRICA) 150 MG capsule Take 150 mg by mouth 2 (two) times daily. 08/10/19   [provider]  promethazine (PHENERGAN) 25 MG tablet Take 25 mg by mouth every 8 (eight) hours as needed. 06/02/19   [provider]  traMADol (ULTRAM) 50 MG tablet Take 100 mg by mouth every 8 (eight) hours as needed.  07/19/19   [provider]    Allergies    Fish oil, Iodinated diagnostic agents, Melatonin, Other, Phenylephrine-guaifenesin, Propoxyphene, Abilify [aripiprazole], Dhea [nutritional supplements], Dihydroergotamine, Divalproex sodium, Doxycycline, Erythromycin, Indomethacin, Lactose intolerance (gi), Melatonin, Risperidone and related, Shellfish allergy, Statins, Tape, Dihydroergotamine, Lactose intolerance (gi), Nsaids, Sulfa antibiotics, and Tape  Review of Systems   Review of Systems  Gastrointestinal: Positive for abdominal pain.  All other systems reviewed and are negative.   Physical Exam Updated Vital Signs BP (!) 158/104 (BP Location: Right Arm)   Pulse 99   Temp 99.2 F (37.3 C) (Oral)   Resp 18   Ht 6\' 8"  (2.032 m)   Wt 79.4 kg   LMP  (LMP Unknown)   SpO2 95%   BMI 19.22 kg/m   Physical Exam Vitals and nursing note reviewed.  Constitutional:      General: She is not in acute distress.    Appearance: She is well-developed and normal weight.  HENT:     Head: Normocephalic and atraumatic.     Mouth/Throat:     Mouth: Mucous membranes are moist.  Eyes:     Conjunctiva/sclera: Conjunctivae normal.     Pupils: Pupils are equal, round, and reactive to light.  Cardiovascular:     Rate and Rhythm: Regular rhythm.  Tachycardia present.     Pulses: Normal pulses.     Heart sounds: No murmur heard.   Pulmonary:     Effort: Pulmonary effort is normal. No respiratory distress.     Breath sounds: Normal breath sounds. No wheezing or rales.  Abdominal:     General: There is no distension.     Palpations: Abdomen is soft.     Tenderness: There is abdominal tenderness. There is no guarding or rebound.     Comments: Mild tenderness in  the epigastric area and periumbilical area. No rebound or guarding. Abdomen is soft.  Musculoskeletal:        General: No tenderness. Normal range of motion.     Cervical back: Normal range of motion and neck supple.     Right lower leg: No edema.     Left lower leg: No edema.  Skin:    General: Skin is warm and dry.     Findings: No erythema or rash.  Neurological:     General: No focal deficit present.     Mental Status: She is alert and oriented to person, place, and time. Mental status is at baseline.  Psychiatric:        Mood and Affect: Mood normal.        Behavior: Behavior normal.        Thought Content: Thought content normal.      ED Results / Procedures / Treatments   Labs (all labs ordered are listed, but only abnormal results are displayed) Labs Reviewed  COMPREHENSIVE METABOLIC PANEL - Abnormal; Notable for the following components:      Result Value   Sodium 133 (*)    CO2 20 (*)    BUN 31 (*)    Creatinine, Ser 1.67 (*)    Albumin 3.3 (*)    AST 13 (*)    GFR calc non Af Amer 38 (*)    GFR calc Af Amer 44 (*)    All other components within normal limits  URINALYSIS, ROUTINE W REFLEX MICROSCOPIC - Abnormal; Notable for the following components:   Hgb urine dipstick SMALL (*)    Protein, ur >300 (*)    All other components within normal limits  URINALYSIS, MICROSCOPIC (REFLEX) - Abnormal; Notable for the following components:   Bacteria, UA RARE (*)    All other components within normal limits  CBC WITH DIFFERENTIAL/PLATELET  LIPASE,  BLOOD    EKG None  Radiology CT ABDOMEN PELVIS WO CONTRAST  Result Date: 11/06/2019 CLINICAL DATA:  Abdominal pain x1 week. EXAM: CT ABDOMEN AND PELVIS WITHOUT CONTRAST TECHNIQUE: Multidetector CT imaging of the abdomen and pelvis was performed following the standard protocol without IV contrast. COMPARISON:  August 12, 2019 FINDINGS: Lower chest: No acute abnormality. Hepatobiliary: There is diffuse fatty infiltration of the liver with focal fatty sparing seen adjacent to the gallbladder fossa. No gallstones, gallbladder wall thickening, or biliary dilatation. Pancreas: Unremarkable. No pancreatic ductal dilatation or surrounding inflammatory changes. Spleen: Normal in size without focal abnormality. Adrenals/Urinary Tract: Adrenal glands are unremarkable. Kidneys are normal in size, without renal calculi or hydronephrosis. A stable cyst is seen along the posterolateral aspect of the mid right kidney. The urinary bladder is contracted and subsequently limited in evaluation. Stomach/Bowel: Stomach is within normal limits. Appendix appears normal. No evidence of bowel wall thickening, distention, or inflammatory changes. Vascular/Lymphatic: No significant vascular findings are present. No enlarged abdominal or pelvic lymph nodes. Reproductive: Status post hysterectomy. No adnexal masses. Other: No abdominal wall hernia or abnormality. No abdominopelvic ascites. Musculoskeletal: No acute or significant osseous findings. IMPRESSION: 1. No evidence of acute or active process within the abdomen or pelvis. 2. Diffuse fatty infiltration of the liver with focal fatty sparing adjacent to the gallbladder fossa. 3. Stable right renal cyst. Electronically Signed   By: Virgina Norfolk M.D.   On: 11/06/2019 21:20    Procedures Procedures (including critical care time)  Medications Ordered in ED Medications  metoCLOPramide (REGLAN) injection 10 mg (has no administration  in time range)  lactated ringers bolus  1,000 mL ( Intravenous Stopped 11/06/19 2026)  morphine 4 MG/ML injection 4 mg (4 mg Intravenous Given 11/06/19 1927)  ondansetron (ZOFRAN) injection 4 mg (4 mg Intravenous Given 11/06/19 1925)  morphine 4 MG/ML injection 4 mg (4 mg Intravenous Given 11/06/19 2158)  ondansetron (ZOFRAN) injection 4 mg (4 mg Intravenous Given 11/06/19 2158)    ED Course  I have reviewed the triage vital signs and the nursing notes.  Pertinent labs & imaging results that were available during my care of the patient were reviewed by me and considered in my medical decision making (see chart for details).    MDM Rules/Calculators/A&P                          Patient is a 44 year old female with multiple medical problems coming today with nonspecific abdominal pain that is been intermittent for the last 2 weeks but worse in the last 24 to 36 hours with associated nausea vomiting.  Patient does have a history of hypertriglyceridemia with increased triglycerides within the last week so concern for pancreatitis.  However lipase is within normal limits and LFTs are within normal limits.  On exam patient has a nonspecific abdominal exam.  She does have some epigastric and periumbilical tenderness but no rebound or guarding concerning for perforated viscus.  She denies fever and UA without acute findings.  CBC is within normal limits and low suspicion for cardiac or lung pathology.  Patient also saw endocrinologist this week and had elevated TSH.  She reports they have changed her medications but she has not started them yet.  Patient does have a history of recurrent abdominal pain and an the last few months has had a pelvic surgery for torsed ovary and benign ovarian mass.  She reports the pain today does not feel like what she has had from her endometriosis in the past.  Patient CT without evidence of obstruction or acute bowel abnormality.  Patient was given morphine and Zofran with mild improvement but on return she is having  recurrent pain.  Given a second dose of the same but when tried to p.o. challenge she felt nauseated with some reflux.  We will try Reglan and GI cocktail.  At this time the goal would be to achieve pain control and nausea control and follow-up with PCP as there does not appear to be an emergent cause for her symptoms today.  MDM Number of Diagnoses or Management Options   Amount and/or Complexity of Data Reviewed Clinical lab tests: ordered and reviewed Tests in the radiology section of CPT: ordered and reviewed Tests in the medicine section of CPT: ordered and reviewed Decide to obtain previous medical records or to obtain history from someone other than the patient: yes Obtain history from someone other than the patient: no Review and summarize past medical records: yes Independent visualization of images, tracings, or specimens: yes  Risk of Complications, Morbidity, and/or Mortality Presenting problems: moderate Diagnostic procedures: low Management options: low  Patient Progress Patient progress: stable  Final Clinical Impression(s) / ED Diagnoses Final diagnoses:  None    Rx / DC Orders ED Discharge Orders    None       Blanchie Dessert, MD 11/06/19 2315

## 2019-11-07 DIAGNOSIS — R1013 Epigastric pain: Secondary | ICD-10-CM | POA: Diagnosis not present

## 2019-11-07 MED ORDER — METOCLOPRAMIDE HCL 10 MG PO TABS: 10.0000 mg | ORAL_TABLET | Freq: Four times a day (QID) | ORAL | 0 refills | Status: DC | PRN

## 2019-11-07 MED ORDER — MORPHINE SULFATE (PF) 4 MG/ML IV SOLN
4.0000 mg | Freq: Once | INTRAVENOUS | Status: AC
  Administered 2019-11-07: 4 mg via INTRAVENOUS
  Filled 2019-11-07: qty 1

## 2019-11-07 NOTE — ED Notes (Signed)
In room to check on patient.  Reports her pain has decreased to a 7/10 but continues to be nauseated.  No vomit noted in emesis bag.  Provider made aware.

## 2019-11-07 NOTE — ED Provider Notes (Signed)
2:23 AM Patient feeling better, pain is improved and she is no longer nauseated.     Jaymen Fetch, Jenny Reichmann, MD 11/07/19 [phone]

## 2019-11-16 ENCOUNTER — Other Ambulatory Visit: Payer: Self-pay

## 2019-11-16 ENCOUNTER — Emergency Department (HOSPITAL_BASED_OUTPATIENT_CLINIC_OR_DEPARTMENT_OTHER)
Admission: EM | Admit: 2019-11-16 | Discharge: 2019-11-16 | Disposition: A | Discharge status: Home / Self Care | Payer: Medicare Other | Attending: Emergency Medicine | Admitting: Emergency Medicine

## 2019-11-16 ENCOUNTER — Encounter (HOSPITAL_BASED_OUTPATIENT_CLINIC_OR_DEPARTMENT_OTHER): Payer: Self-pay | Admitting: Emergency Medicine

## 2019-11-16 DIAGNOSIS — R197 Diarrhea, unspecified: Secondary | ICD-10-CM | POA: Insufficient documentation

## 2019-11-16 DIAGNOSIS — Z7984 Long term (current) use of oral hypoglycemic drugs: Secondary | ICD-10-CM | POA: Diagnosis not present

## 2019-11-16 DIAGNOSIS — E039 Hypothyroidism, unspecified: Secondary | ICD-10-CM | POA: Diagnosis not present

## 2019-11-16 DIAGNOSIS — E1122 Type 2 diabetes mellitus with diabetic chronic kidney disease: Secondary | ICD-10-CM | POA: Diagnosis not present

## 2019-11-16 DIAGNOSIS — R112 Nausea with vomiting, unspecified: Secondary | ICD-10-CM | POA: Diagnosis not present

## 2019-11-16 DIAGNOSIS — Z79899 Other long term (current) drug therapy: Secondary | ICD-10-CM | POA: Insufficient documentation

## 2019-11-16 DIAGNOSIS — I129 Hypertensive chronic kidney disease with stage 1 through stage 4 chronic kidney disease, or unspecified chronic kidney disease: Secondary | ICD-10-CM | POA: Diagnosis not present

## 2019-11-16 DIAGNOSIS — Z79890 Hormone replacement therapy (postmenopausal): Secondary | ICD-10-CM | POA: Insufficient documentation

## 2019-11-16 DIAGNOSIS — R109 Unspecified abdominal pain: Secondary | ICD-10-CM | POA: Insufficient documentation

## 2019-11-16 DIAGNOSIS — Z7982 Long term (current) use of aspirin: Secondary | ICD-10-CM | POA: Diagnosis not present

## 2019-11-16 DIAGNOSIS — Z86711 Personal history of pulmonary embolism: Secondary | ICD-10-CM | POA: Diagnosis not present

## 2019-11-16 DIAGNOSIS — N183 Chronic kidney disease, stage 3 unspecified: Secondary | ICD-10-CM | POA: Diagnosis not present

## 2019-11-16 LAB — COMPREHENSIVE METABOLIC PANEL
ALT: 12 U/L (ref 0–44)
AST: 14 U/L — ABNORMAL LOW (ref 15–41)
Albumin: 3.5 g/dL (ref 3.5–5.0)
Alkaline Phosphatase: 88 U/L (ref 38–126)
Anion gap: 15 (ref 5–15)
BUN: 33 mg/dL — ABNORMAL HIGH (ref 6–20)
CO2: 20 mmol/L — ABNORMAL LOW (ref 22–32)
Calcium: 9.8 mg/dL (ref 8.9–10.3)
Chloride: 99 mmol/L (ref 98–111)
Creatinine, Ser: 1.64 mg/dL — ABNORMAL HIGH (ref 0.44–1.00)
GFR calc Af Amer: 45 mL/min — ABNORMAL LOW (ref >60)
GFR calc non Af Amer: 38 mL/min — ABNORMAL LOW (ref >60)
Glucose, Bld: 159 mg/dL — ABNORMAL HIGH (ref 70–99)
Potassium: 5.1 mmol/L (ref 3.5–5.1)
Sodium: 134 mmol/L — ABNORMAL LOW (ref 135–145)
Total Bilirubin: 0.5 mg/dL (ref 0.3–1.2)
Total Protein: 7.5 g/dL (ref 6.5–8.1)

## 2019-11-16 LAB — URINALYSIS, MICROSCOPIC (REFLEX)

## 2019-11-16 LAB — CBC WITH DIFFERENTIAL/PLATELET
Abs Immature Granulocytes: 0.07 10*3/uL (ref 0.00–0.07)
Basophils Absolute: 0 10*3/uL (ref 0.0–0.1)
Basophils Relative: 0 %
Eosinophils Absolute: 0 10*3/uL (ref 0.0–0.5)
Eosinophils Relative: 0 %
HCT: 38.7 % (ref 36.0–46.0)
Hemoglobin: 13.5 g/dL (ref 12.0–15.0)
Immature Granulocytes: 1 %
Lymphocytes Relative: 15 %
Lymphs Abs: 1.3 10*3/uL (ref 0.7–4.0)
MCH: 31 pg (ref 26.0–34.0)
MCHC: 34.9 g/dL (ref 30.0–36.0)
MCV: 88.8 fL (ref 80.0–100.0)
Monocytes Absolute: 0.2 10*3/uL (ref 0.1–1.0)
Monocytes Relative: 3 %
Neutro Abs: 7.4 10*3/uL (ref 1.7–7.7)
Neutrophils Relative %: 81 %
Platelets: 307 10*3/uL (ref 150–400)
RBC: 4.36 MIL/uL (ref 3.87–5.11)
RDW: 14.2 % (ref 11.5–15.5)
WBC: 9.1 10*3/uL (ref 4.0–10.5)
nRBC: 0 % (ref 0.0–0.2)

## 2019-11-16 LAB — URINALYSIS, ROUTINE W REFLEX MICROSCOPIC
Bilirubin Urine: NEGATIVE
Glucose, UA: NEGATIVE mg/dL
Ketones, ur: NEGATIVE mg/dL
Leukocytes,Ua: NEGATIVE
Nitrite: NEGATIVE
Protein, ur: >300 mg/dL — AB
Specific Gravity, Urine: 1.02 (ref 1.005–1.030)
pH: 6 (ref 5.0–8.0)

## 2019-11-16 LAB — PREGNANCY, URINE: Preg Test, Ur: NEGATIVE

## 2019-11-16 LAB — LIPASE, BLOOD: Lipase: 45 U/L (ref 11–51)

## 2019-11-16 MED ORDER — TRAMADOL HCL 50 MG PO TABS
100.0000 mg | ORAL_TABLET | Freq: Once | ORAL | Status: AC
  Administered 2019-11-16: 100 mg via ORAL
  Filled 2019-11-16: qty 2

## 2019-11-16 MED ORDER — ONDANSETRON HCL 4 MG PO TABS
4.0000 mg | ORAL_TABLET | Freq: Four times a day (QID) | ORAL | 0 refills | Status: DC
Start: 2019-11-16 — End: 2019-12-29

## 2019-11-16 MED ORDER — DICYCLOMINE HCL 20 MG PO TABS
20.0000 mg | ORAL_TABLET | Freq: Two times a day (BID) | ORAL | 0 refills | Status: DC
Start: 2019-11-16 — End: 2021-04-30

## 2019-11-16 MED ORDER — DICYCLOMINE HCL 10 MG/ML IM SOLN
20.0000 mg | Freq: Once | INTRAMUSCULAR | Status: AC
  Administered 2019-11-16: 20 mg via INTRAMUSCULAR
  Filled 2019-11-16: qty 2

## 2019-11-16 MED ORDER — ACETAMINOPHEN 325 MG PO TABS
650.0000 mg | ORAL_TABLET | Freq: Once | ORAL | Status: AC
  Administered 2019-11-16: 650 mg via ORAL
  Filled 2019-11-16: qty 2

## 2019-11-16 MED ORDER — PROCHLORPERAZINE MALEATE 10 MG PO TABS
5.0000 mg | ORAL_TABLET | Freq: Once | ORAL | Status: AC
  Administered 2019-11-16: 5 mg via ORAL
  Filled 2019-11-16: qty 1

## 2019-11-16 MED ORDER — CYCLOBENZAPRINE HCL 5 MG PO TABS
5.0000 mg | ORAL_TABLET | Freq: Once | ORAL | Status: AC
  Administered 2019-11-16: 5 mg via ORAL
  Filled 2019-11-16: qty 1

## 2019-11-16 MED ORDER — METOCLOPRAMIDE HCL 5 MG/ML IJ SOLN
10.0000 mg | Freq: Once | INTRAMUSCULAR | Status: AC
  Administered 2019-11-16: 10 mg via INTRAVENOUS
  Filled 2019-11-16: qty 2

## 2019-11-16 MED ORDER — ONDANSETRON HCL 4 MG/2ML IJ SOLN
4.0000 mg | Freq: Once | INTRAMUSCULAR | Status: AC
  Administered 2019-11-16: 4 mg via INTRAVENOUS
  Filled 2019-11-16: qty 2

## 2019-11-16 MED ORDER — FAMOTIDINE IN NACL 20-0.9 MG/50ML-% IV SOLN
20.0000 mg | Freq: Once | INTRAVENOUS | Status: AC
  Administered 2019-11-16: 20 mg via INTRAVENOUS
  Filled 2019-11-16: qty 50

## 2019-11-16 MED ORDER — SODIUM CHLORIDE 0.9 % IV BOLUS
1000.0000 mL | Freq: Once | INTRAVENOUS | Status: AC
  Administered 2019-11-16: 1000 mL via INTRAVENOUS

## 2019-11-16 MED ORDER — PREGABALIN 75 MG PO CAPS
150.0000 mg | ORAL_CAPSULE | Freq: Once | ORAL | Status: DC
  Filled 2019-11-16: qty 2

## 2019-11-16 MED FILL — DICYCLOMINE 20 MG TABLET: 20 | 10 days supply | Qty: 20 | Fill #0

## 2019-11-16 MED FILL — ONDANSETRON HCL 4 MG TABLET: 4 | 3 days supply | Qty: 12 | Fill #0

## 2019-11-16 NOTE — ED Notes (Signed)
Pt. Still needs UA, she went to bathroom and urinated without collecting.

## 2019-11-16 NOTE — ED Notes (Signed)
Pt ambulatory to restroom with standby assist for urine specimen. Pt instructed to use call bell when ready to return to room

## 2019-11-16 NOTE — ED Triage Notes (Addendum)
Pt arrives via EMS with c/o generalized body aches and N/V/D since 0300 this morning. PT endorses eating frozen chinese food for dinner, was kept in refrigerator for "a few days"

## 2019-11-16 NOTE — ED Notes (Signed)
ED Provider at bedside. 

## 2019-11-16 NOTE — ED Notes (Signed)
Pt discharged to home. Discharge instructions have been discussed with patient and/or family members. Pt verbally acknowledges understanding d/c instructions, and endorses comprehension to checkout at registration before leaving.  °

## 2019-11-16 NOTE — ED Provider Notes (Signed)
[facility] EMERGENCY DEPARTMENT Provider Note   CSN: [id number] Arrival date & time: 11/16/19  8315     History Chief Complaint  Patient presents with  . Emesis    Brandi Love is a 44 y.o. female.  HPI   Pt is a 44 y/o female with a h/o anemia, anorexia, anxiety/depression, deafness, CKD, endometriosis, GERD, nephrolithiasis, hyperlipidemia, migraines, OCD, PE, seizures, who presents to the Ed today for eval of NVD that started this morning at 300AM. She reports associated abd pain to the periumbilical area. Pain seems to wax and wane. Rates pain 8/10. Denies dysuria, frequency.   States she had some frozen chinese food that was supposed to be in the freezer but she had kept it in the refrigerator instead for several days.   Past Medical History:  Diagnosis Date  . Anemia   . Anorexia   . Anxiety   . Arthritis    "hands" (09/18/2016)  . Chronic kidney disease   . Chronic lower back pain   . Chronic renal insufficiency   . Deaf   . Deafness   . Depression   . Diabetes ([place])    "borderline"  . Eating disorder   . Endometriosis   . GERD (gastroesophageal reflux disease)   . History of kidney stones   . Hyperlipidemia   . 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 MDD (major depressive disorder)   . Migraine    "frequency varies" (09/18/2016)  . OCD (obsessive compulsive disorder)   . Pneumonia 2011  . PONV (postoperative nausea and vomiting)   . Pulmonary embolism ([place]) 10/2001  . Seizures ([place]) 09/2001 X 1   "day after my hysterectomy"  . Seizures ([place])   . Self mutilating behavior   . Tachycardia     Patient Active Problem List   Diagnosis Date Noted  . Deaf 10/01/2018  . Hematochezia 10/01/2018  . Hypertension 10/01/2018  . Loss of appetite 10/01/2018  . Mental health problem 10/01/2018  . Seizures ([place]) 10/01/2018  . Abdominal pain 07/02/2018  . Gastroesophageal reflux disease without esophagitis 02/16/2018  . Primary insomnia  02/16/2018  . Prediabetes 01/28/2018  . Type 2 diabetes mellitus without complication, without long-term current use of insulin ([place]) 01/28/2018  . Syncope 08/26/2017  . Acute renal failure superimposed on stage 3 chronic kidney disease ([place]) 08/26/2017  . Orthostatic syncope   . Self mutilating behavior 08/08/2017  . Acne 06/11/2017  . Self-care deficit for bathing 06/11/2017  . Urinary tract infection with hematuria   . OCD (obsessive compulsive disorder) 09/29/2016  . Anorexia nervosa with bulimia 09/29/2016  . Deafness congenital 09/29/2016  . Intractable nausea and vomiting 09/29/2016  . Bloody diarrhea 09/29/2016  . AKI (acute kidney injury) ([employer]) 09/29/2016  . Severe dehydration 09/29/2016  . Severe protein-calorie malnutrition ([place]) 09/29/2016  . Congenital hypothyroidism 09/29/2016  . Hypertriglyceridemia 09/29/2016  . Anemia 09/29/2016  . Endometriosis 09/29/2016  . MDD (major depressive disorder) 09/29/2016  . Hyperlipidemia 09/29/2016  . CKD (chronic kidney disease), stage III ([place]) 09/29/2016  . Acute kidney injury ([place]) 09/29/2016  . OAB (overactive bladder) 09/29/2016  . Sepsis ([place]) 09/18/2016  . Acute pyelonephritis 09/18/2016  . Urinary incontinence 08/09/2015  . MDD (major depressive disorder), recurrent, severe, with psychosis ([place]) 12/11/2014  . Severe recurrent major depressive disorder with psychotic symptoms ([place]) 12/11/2014  . Migraine 07/02/2013  . Eating disorder, unspecified 11/23/2012  . Hypothyroidism 11/23/2012  . Dyslipidemia 11/23/2012  . Sinus tachycardia  11/23/2012  . CKD (chronic kidney disease) stage 3, GFR 30-59 ml/min 11/18/2012  . Protein-calorie malnutrition, severe ([place]) 11/13/2012  . Abdominal pain, epigastric 11/12/2012  . Dehydration 11/12/2012  . Acute on chronic renal failure ([place]) 11/12/2012  . Leukocytosis, unspecified 11/12/2012  . Hyponatremia 11/12/2012  . Borderline personality disorder ([place]) 11/12/2012    Past  Surgical History:  Procedure Laterality Date  . ABDOMINAL HYSTERECTOMY  09/2001   'except for right ovary'  . BREAST LUMPECTOMY Left 2000   benign  . BREAST LUMPECTOMY Right 2002   benign  . COCHLEAR IMPLANT Bilateral 2004-2010   "right-left"  . COLONOSCOPY  06/2015    Dr Rolm Bookbinder at [facility] for rectal bleeding: internal hemorrhoids, small anal fissure.    . ESOPHAGOGASTRODUODENOSCOPY N/A 10/07/2016   Procedure: ESOPHAGOGASTRODUODENOSCOPY (EGD);  Surgeon: Ladene Artist, MD;  Location: [facility] ENDOSCOPY;  Service: Endoscopy;  Laterality: N/A;  . EYE MUSCLE SURGERY Left 1980   "lazy eye"  . EYE MUSCLE SURGERY Right 1990   "lazy eye"  . LAPAROSCOPIC ABDOMINAL EXPLORATION  2001 and 2002   for endometriosis  . OOPHORECTOMY Right 08/2019     OB History   No obstetric history on file.     Family History  Problem Relation Age of Onset  . Cancer Father   . Hyperlipidemia Father   . Osteoarthritis Mother   . Hyperlipidemia Mother     Social History   Tobacco Use  . Smoking status: Never Smoker  . Smokeless tobacco: Never Used  Vaping Use  . Vaping Use: Never used  Substance Use Topics  . Alcohol use: Never  . Drug use: Never    Home Medications Prior to Admission medications   Medication Sig Start Date End Date Taking? Authorizing Provider  acetaminophen (TYLENOL) 500 MG tablet Take 1,000-3,000 mg by mouth See admin instructions. Take 2 tablets in the morning, at lunch and take 6 tablets at bedtime    [provider]  allopurinol (ZYLOPRIM) 300 MG tablet Take by mouth. 08/12/19   [provider]  aspirin EC 81 MG tablet Take 81 mg by mouth daily.    [provider]  atenolol (TENORMIN) 50 MG tablet Take 1 tablet (50 mg total) by mouth daily. 09/17/19 12/16/19  Adrian Prows, MD  cyclobenzaprine (FLEXERIL) 5 MG tablet Take by mouth 3 (three) times daily as needed.  06/30/18   [provider]  D3-50 1.25 MG (50000 UT) capsule Take 50,000  Units by mouth once a week. 08/08/19   [provider]  dicyclomine (BENTYL) 20 MG tablet Take 1 tablet (20 mg total) by mouth 2 (two) times daily. 11/16/19   Haruye Lainez S, PA-C  docusate sodium (COLACE) 100 MG capsule Take 2 capsules at bedtime 07/14/18   [provider]  Dulaglutide 0.75 MG/0.5ML SOPN Inject into the skin. 08/19/19   [provider]  estrogens, conjugated, (PREMARIN) 0.625 MG tablet Take by mouth. 09/16/19   [provider]  fluticasone (FLONASE) 50 MCG/ACT nasal spray Place 2 sprays into both nostrils 2 (two) times daily as needed.  05/03/17   [provider]  gemfibrozil (LOPID) 600 MG tablet 600 mg in the morning and 300 mg in the evening 09/09/17   [provider]  glimepiride (AMARYL) 4 MG tablet Take 4 mg by mouth daily. 06/15/19   [provider]  levothyroxine (SYNTHROID) 137 MCG tablet Take 150 mcg by mouth daily.  07/17/17   [provider]  lurasidone (LATUDA) 40  MG TABS tablet Take 40 mg by mouth daily with breakfast.    [provider]  magnesium oxide (MAG-OX) 400 (241.3 Mg) MG tablet Take by mouth 2 (two) times a day. 11/06/17   [provider]  meclizine (ANTIVERT) 25 MG tablet Take 25 mg by mouth 3 (three) times daily as needed. 06/03/19   [provider]  Melatonin 5 MG TABS Take 3 mg by mouth at bedtime.    [provider]  metoCLOPramide (REGLAN) 10 MG tablet Take 1 tablet (10 mg total) by mouth every 6 (six) hours as needed for nausea or vomiting. 11/07/19   Molpus, John, MD  Multiple Vitamin (MULTIVITAMIN WITH MINERALS) TABS Take 1 tablet by mouth daily.    [provider]  ondansetron (ZOFRAN) 4 MG tablet Take 1 tablet (4 mg total) by mouth every 6 (six) hours. 11/16/19   Isac Lincks S, PA-C  ondansetron (ZOFRAN-ODT) 8 MG disintegrating tablet Take by mouth. 06/24/18   [provider]  pantoprazole (PROTONIX) 40 MG tablet Take 40 mg by  mouth daily.    [provider]  pregabalin (LYRICA) 150 MG capsule Take 150 mg by mouth 2 (two) times daily. 08/10/19   [provider]  promethazine (PHENERGAN) 25 MG tablet Take 25 mg by mouth every 8 (eight) hours as needed. 06/02/19   [provider]  traMADol (ULTRAM) 50 MG tablet Take 100 mg by mouth every 8 (eight) hours as needed.  07/19/19   [provider]    Allergies    Fish oil, Iodinated diagnostic agents, Melatonin, Other, Phenylephrine-guaifenesin, Propoxyphene, Abilify [aripiprazole], Dhea [nutritional supplements], Dihydroergotamine, Divalproex sodium, Doxycycline, Erythromycin, Indomethacin, Lactose intolerance (gi), Melatonin, Risperidone and related, Shellfish allergy, Statins, Tape, Dihydroergotamine, Lactose intolerance (gi), Nsaids, Sulfa antibiotics, and Tape  Review of Systems   Review of Systems  Constitutional: Negative for fever.  HENT: Negative for ear pain and sore throat.   Eyes: Negative for visual disturbance.  Respiratory: Negative for cough and shortness of breath.   Cardiovascular: Negative for chest pain.  Gastrointestinal: Positive for abdominal pain, diarrhea, nausea and vomiting.  Genitourinary: Negative for dysuria and hematuria.  Musculoskeletal: Negative for back pain.  Skin: Negative for rash.  Neurological: Negative for headaches.  All other systems reviewed and are negative.   Physical Exam Updated Vital Signs BP (!) 162/102 (BP Location: Right Arm)   Pulse (!) 106   Temp 99.2 F (37.3 C) (Oral)   Resp 18   Ht 5\' 8"  (1.727 m)   Wt 76.7 kg   LMP  (LMP Unknown)   SpO2 98%   BMI 25.70 kg/m   Physical Exam Vitals and nursing note reviewed.  Constitutional:      General: She is not in acute distress.    Appearance: She is well-developed.  HENT:     Head: Normocephalic and atraumatic.  Eyes:     Conjunctiva/sclera: Conjunctivae normal.  Cardiovascular:     Rate and Rhythm: Normal rate and  regular rhythm.     Heart sounds: Normal heart sounds. No murmur heard.   Pulmonary:     Effort: Pulmonary effort is normal. No respiratory distress.     Breath sounds: Normal breath sounds. No wheezing, rhonchi or rales.  Abdominal:     General: Abdomen is flat. Bowel sounds are normal.     Palpations: Abdomen is soft.     Tenderness: There is abdominal tenderness in the epigastric area and left upper quadrant. There is no guarding or rebound.  Musculoskeletal:     Cervical back: Neck supple.  Skin:    General: Skin is warm and dry.  Neurological:     Mental Status: She is alert.     ED Results / Procedures / Treatments   Labs (all labs ordered are listed, but only abnormal results are displayed) Labs Reviewed  COMPREHENSIVE METABOLIC PANEL - Abnormal; Notable for the following components:      Result Value   Sodium 134 (*)    CO2 20 (*)    Glucose, Bld 159 (*)    BUN 33 (*)    Creatinine, Ser 1.64 (*)    AST 14 (*)    GFR calc non Af Amer 38 (*)    GFR calc Af Amer 45 (*)    All other components within normal limits  URINALYSIS, ROUTINE W REFLEX MICROSCOPIC - Abnormal; Notable for the following components:   Hgb urine dipstick SMALL (*)    Protein, ur >300 (*)    All other components within normal limits  URINALYSIS, MICROSCOPIC (REFLEX) - Abnormal; Notable for the following components:   Bacteria, UA RARE (*)    All other components within normal limits  CBC WITH DIFFERENTIAL/PLATELET  LIPASE, BLOOD  PREGNANCY, URINE    EKG None  Radiology No results found.  Procedures Procedures (including critical care time)  Medications Ordered in ED Medications  pregabalin (LYRICA) capsule 150 mg (150 mg Oral Not Given 11/16/19 1212)  sodium chloride 0.9 % bolus 1,000 mL (0 mLs Intravenous Stopped 11/16/19 1204)  dicyclomine (BENTYL) injection 20 mg (20 mg Intramuscular Given 11/16/19 1045)  metoCLOPramide (REGLAN) injection 10 mg (10 mg Intravenous Given 11/16/19 1041)    famotidine (PEPCID) IVPB 20 mg premix (0 mg Intravenous Stopped 11/16/19 1105)  cyclobenzaprine (FLEXERIL) tablet 5 mg (5 mg Oral Given 11/16/19 1215)  traMADol (ULTRAM) tablet 100 mg (100 mg Oral Given 11/16/19 1215)  ondansetron (ZOFRAN) injection 4 mg (4 mg Intravenous Given 11/16/19 1213)  acetaminophen (TYLENOL) tablet 650 mg (650 mg Oral Given 11/16/19 1214)    ED Course  I have reviewed the triage vital signs and the nursing notes.  Pertinent labs & imaging results that were available during my care of the patient were reviewed by me and considered in my medical decision making (see chart for details).    MDM Rules/Calculators/A&P                         Patient with symptoms consistent with viral gastroenteritis.  Vitals are stable, no fever.  No signs of dehydration, tolerating PO fluids > 6 oz.  Lungs are clear.  No focal abdominal pain, no concern for appendicitis, cholecystitis, pancreatitis, ruptured viscus, UTI, kidney stone, or any other abdominal etiology.  CBC without leukocytosis or anemia, CMP with no significant electrolyte derangement, normal liver function and bilirubin.  Kidney function elevated but at baseline for patient.  Lipase negative.  UA without evidence of urinary tract infection.  Pregnancy test negative.  Supportive therapy indicated with return if symptoms worsen.  Patient counseled.  Final Clinical Impression(s) / ED Diagnoses Final diagnoses:  Nausea vomiting and diarrhea    Rx / DC Orders ED Discharge Orders         Ordered    ondansetron (ZOFRAN) 4 MG tablet  Every 6 hours     Discontinue  Reprint     11/16/19 1426    dicyclomine (BENTYL) 20 MG tablet  2 times daily  Discontinue  Reprint     11/16/19 [address], PA-C 11/16/19 1427    Wyvonnia Dusky, MD 11/16/19 1734

## 2019-11-16 NOTE — ED Notes (Signed)
Pt to restroom in wheelchair. Attempted to ambulate, pt weak.

## 2019-11-16 NOTE — Discharge Instructions (Signed)
You were given a prescription for zofran to help with your nausea and Bentyl to help with your abdominal cramping/pain. Please take as directed.  Please follow up with your primary doctor within the next 5-7 days.  If you do not have a primary care provider, information for a healthcare clinic has been provided for you to make arrangements for follow up care. Please return to the ER sooner if you have any new or worsening symptoms, or if you have any of the following symptoms:  Abdominal pain that does not go away.  You have a fever.  You keep throwing up (vomiting).  The pain is felt only in portions of the abdomen. Pain in the right side could possibly be appendicitis. In an adult, pain in the left lower portion of the abdomen could be colitis or diverticulitis.  You pass bloody or black tarry stools.  There is bright red blood in the stool.  The constipation stays for more than 4 days.  There is belly (abdominal) or rectal pain.  You do not seem to be getting better.  You have any questions or concerns.

## 2019-11-16 NOTE — ED Notes (Signed)
Tolerating po fluids well.   

## 2019-11-17 ENCOUNTER — Ambulatory Visit: Payer: Medicare Other | Admitting: Cardiology

## 2019-12-10 ENCOUNTER — Other Ambulatory Visit: Payer: Self-pay

## 2019-12-10 DIAGNOSIS — I10 Essential (primary) hypertension: Secondary | ICD-10-CM

## 2019-12-10 MED ORDER — ATENOLOL 50 MG PO TABS: 50.0000 mg | ORAL_TABLET | Freq: Every day | ORAL | 2 refills | Status: DC

## 2019-12-29 ENCOUNTER — Encounter: Payer: Self-pay | Admitting: Cardiology

## 2019-12-29 ENCOUNTER — Ambulatory Visit: Payer: Medicare Other | Admitting: Cardiology

## 2019-12-29 ENCOUNTER — Other Ambulatory Visit: Payer: Self-pay

## 2019-12-29 VITALS — BP 152/102 | HR 87 | Resp 16 | Ht 68.0 in | Wt 172.0 lb

## 2019-12-29 DIAGNOSIS — N1832 Chronic kidney disease, stage 3b: Secondary | ICD-10-CM

## 2019-12-29 DIAGNOSIS — F5081 Binge eating disorder: Secondary | ICD-10-CM

## 2019-12-29 DIAGNOSIS — E781 Pure hyperglyceridemia: Secondary | ICD-10-CM

## 2019-12-29 DIAGNOSIS — I10 Essential (primary) hypertension: Secondary | ICD-10-CM

## 2019-12-29 MED ORDER — AMLODIPINE BESYLATE 10 MG PO TABS: 10.0000 mg | ORAL_TABLET | Freq: Every day | ORAL | 2 refills | Status: DC

## 2019-12-29 MED ORDER — ATENOLOL 50 MG PO TABS: 50.0000 mg | ORAL_TABLET | Freq: Every day | ORAL | 3 refills | Status: DC

## 2019-12-29 NOTE — Progress Notes (Signed)
Primary Physician/Referring:  Bernerd Limbo, MD  Patient ID: Brandi Love, female    DOB: 1976/03/05, 44 y.o.   MRN: [id number]  Chief Complaint  Patient presents with  . Acquired Hyperglyceridemia  . Follow-up    2 months   HPI:    Brandi Love  is a 44 y.o. eating disorder, depression with psychosis, borderline personality disorder, OCD, hypothyroidism, deafness with cochlear implants, and chronic kidney disease stage III, and is attributed to NSAID use and also patient once try to commit suicide by consuming excessive amounts of aspirin in the remote past, DM.  She is being followed at [facility] for familial hypertriglyceridemia.   She underwent right salpingo-oophorectomy on 09/02/2019 without periprocedural cardiac complications and found to have benign tumor. She has had multiple hospital admissions for eating disorder and also for depression. She does admit to not cooking but eats mostly frozen food. She has noticed her blood pressure to be elevated.  Fortunately she has not had any chest pain. I had started her on Atenolol 50 mg daily which she is tolerating.   Past Medical History:  Diagnosis Date  . Anemia   . Anorexia   . Anxiety   . Arthritis    "hands" (09/18/2016)  . Chronic kidney disease   . Chronic lower back pain   . Chronic renal insufficiency   . Deaf   . Deafness   . Depression   . Diabetes ([place])    "borderline"  . Eating disorder   . Endometriosis   . GERD (gastroesophageal reflux disease)   . History of kidney stones   . Hyperlipidemia   . 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 MDD (major depressive disorder)   . Migraine    "frequency varies" (09/18/2016)  . OCD (obsessive compulsive disorder)   . Pneumonia 2011  . PONV (postoperative nausea and vomiting)   . Pulmonary embolism ([place]) 10/2001  . Seizures ([place]) 09/2001 X 1   "day after my hysterectomy"  . Seizures ([place])   . Self mutilating behavior   . Tachycardia    Past  Surgical History:  Procedure Laterality Date  . ABDOMINAL HYSTERECTOMY  09/2001   'except for right ovary'  . BREAST LUMPECTOMY Left 2000   benign  . BREAST LUMPECTOMY Right 2002   benign  . COCHLEAR IMPLANT Bilateral 2004-2010   "right-left"  . COLONOSCOPY  06/2015    Dr Rolm Bookbinder at [place] for rectal bleeding: internal hemorrhoids, small anal fissure.    . ESOPHAGOGASTRODUODENOSCOPY N/A 10/07/2016   Procedure: ESOPHAGOGASTRODUODENOSCOPY (EGD);  Surgeon: Ladene Artist, MD;  Location: [facility] ENDOSCOPY;  Service: Endoscopy;  Laterality: N/A;  . EYE MUSCLE SURGERY Left 1980   "lazy eye"  . EYE MUSCLE SURGERY Right 1990   "lazy eye"  . LAPAROSCOPIC ABDOMINAL EXPLORATION  2001 and 2002   for endometriosis  . OOPHORECTOMY Right 08/2019   Family History  Problem Relation Age of Onset  . Cancer Father   . Hyperlipidemia Father   . Osteoarthritis Mother   . Hyperlipidemia Mother     Social History   Tobacco Use  . Smoking status: Never Smoker  . Smokeless tobacco: Never Used  Substance Use Topics  . Alcohol use: Never   Marital Status: Single  ROS  Review of Systems  Cardiovascular: Negative for chest pain, dyspnea on exertion and leg swelling.  Gastrointestinal: Negative for melena.   Objective  Blood pressure (!) 152/102, pulse 87, resp. rate  16, height 5\' 8"  (1.727 m), weight 172 lb (78 kg), SpO2 98 %.  Vitals with BMI 12/29/2019 11/16/2019 11/16/2019  Height 5\' 8"  - -  Weight 172 lbs - -  BMI 18.29 - -  Systolic 937 169 678  Diastolic 938 101 751  Pulse 87 104 106     Physical Exam Cardiovascular:     Rate and Rhythm: Normal rate and regular rhythm.     Pulses: Normal pulses and intact distal pulses.     Heart sounds: Normal heart sounds. No murmur heard.  No gallop.      Comments: No leg edema, no JVD. Pulmonary:     Effort: Pulmonary effort is normal.     Breath sounds: Normal breath sounds.  Abdominal:     General: Bowel sounds are normal.      Palpations: Abdomen is soft.    Laboratory examination:   Recent Labs    08/12/19 2055 08/12/19 2055 08/12/19 2056 11/06/19 1841 11/16/19 1020  NA 124*   < > 127* 133* 134*  K 4.7   < > 4.9 4.3 5.1  CL 93*  --   --  100 99  CO2 17*  --   --  20* 20*  GLUCOSE 592*  --   --  74 159*  BUN 41*  --   --  31* 33*  CREATININE 1.81*  --   --  1.67* 1.64*  CALCIUM 9.5  --   --  9.9 9.8  GFRNONAA 34*  --   --  38* 38*  GFRAA 40*  --   --  44* 45*   < > = values in this interval not displayed.   CrCl cannot be calculated (Patient's most recent lab result is older than the maximum 21 days allowed.).  CMP Latest Ref Rng & Units 11/16/2019 11/06/2019 08/12/2019  Glucose 70 - 99 mg/dL 159(H) 74 -  BUN 6 - 20 mg/dL 33(H) 31(H) -  Creatinine 0.44 - 1.00 mg/dL 1.64(H) 1.67(H) -  Sodium 135 - 145 mmol/L 134(L) 133(L) 127(L)  Potassium 3.5 - 5.1 mmol/L 5.1 4.3 4.9  Chloride 98 - 111 mmol/L 99 100 -  CO2 22 - 32 mmol/L 20(L) 20(L) -  Calcium 8.9 - 10.3 mg/dL 9.8 9.9 -  Total Protein 6.5 - 8.1 g/dL 7.5 6.8 -  Total Bilirubin 0.3 - 1.2 mg/dL 0.5 0.3 -  Alkaline Phos 38 - 126 U/L 88 89 -  AST 15 - 41 U/L 14(L) 13(L) -  ALT 0 - 44 U/L 12 10 -   CBC Latest Ref Rng & Units 11/16/2019 11/06/2019 08/12/2019  WBC 4.0 - 10.5 K/uL 9.1 8.8 -  Hemoglobin 12.0 - 15.0 g/dL 13.5 13.0 11.9(L)  Hematocrit 36 - 46 % 38.7 36.8 35.0(L)  Platelets 150 - 400 K/uL 307 282 -    Lipid Panel No results for input(s): CHOL, TRIG, LDLCALC, VLDL, HDL, CHOLHDL, LDLDIRECT in the last 8760 hours.  HEMOGLOBIN A1C Lab Results  Component Value Date   HGBA1C 5.5 10/10/2016   MPG 111 10/10/2016   TSH Recent Labs    03/02/19 1526  TSH 1.258    External labs:   Labs 11/08/2019:  Total cholesterol 477, triglycerides 4259, HDL 31, LDL not calculated.  Labs 10/28/2019: TSH minimally elevated at 4.680.  Free T4 normal.  Medications and allergies   Allergies  Allergen Reactions  . Fish Oil Anaphylaxis    Facial  edema, itching  . Iodinated Diagnostic Agents Anaphylaxis    '  cant breathe'  . Melatonin Itching  . Other Itching, Swelling, Rash and Anaphylaxis    Unable to take due to renal insufficency Unable to ttake due to renal insufficency Eyes swell 'cant breathe' 'cant breathe' 'cant breathe' 'cant breathe' Other reaction(s): Other (See Comments) Patient stated she can not take because she has renal insufficiency Unable to ttake due to renal insufficency 'cant breathe'  . Phenylephrine-Guaifenesin Shortness Of Breath and Swelling    Throat swelling  . Propoxyphene Anaphylaxis  . Abilify [Aripiprazole] Nausea And Vomiting  . Dhea [Nutritional Supplements] Nausea And Vomiting and Swelling  . Dihydroergotamine Nausea And Vomiting  . Divalproex Sodium Nausea And Vomiting and Other (See Comments)    Weight gain  . Doxycycline Nausea And Vomiting  . Erythromycin Nausea And Vomiting  . Indomethacin Nausea And Vomiting  . Lactose Intolerance (Gi) Nausea And Vomiting  . Melatonin Itching  . Risperidone And Related Other (See Comments)    Out of body feeling  . Shellfish Allergy Itching and Swelling    Eyes swell  . Statins Other (See Comments)    Cannot tolerate  . Tape Other (See Comments)    Redness/ please use paper tape  . Dihydroergotamine Nausea And Vomiting  . Lactose Intolerance (Gi) Nausea And Vomiting  . Nsaids Other (See Comments)    Unable to ttake due to renal insufficency  . Sulfa Antibiotics Itching    Redness/ please use paper tape Redness, can use paper tape  . Tape Other (See Comments)    Redness, can use paper tape     Outpatient Medications Prior to Visit  Medication Sig Dispense Refill  . acetaminophen (TYLENOL) 500 MG tablet Take 1,000-3,000 mg by mouth See admin instructions. Take 2 tablets in the morning, at lunch and take 6 tablets at bedtime    . allopurinol (ZYLOPRIM) 300 MG tablet Take by mouth.    Marland Kitchen aspirin EC 81 MG tablet Take 81 mg by mouth daily.     . cyclobenzaprine (FLEXERIL) 5 MG tablet Take by mouth 3 (three) times daily as needed.     . D3-50 1.25 MG (50000 UT) capsule Take 50,000 Units by mouth once a week.    . dicyclomine (BENTYL) 20 MG tablet Take 1 tablet (20 mg total) by mouth 2 (two) times daily. 20 tablet 0  . docusate sodium (COLACE) 100 MG capsule Take 2 capsules at bedtime    . gemfibrozil (LOPID) 600 MG tablet 600 mg in the morning and 300 mg in the evening    . glimepiride (AMARYL) 4 MG tablet Take 4 mg by mouth daily.    Marland Kitchen levothyroxine (SYNTHROID) 137 MCG tablet Take 150 mcg by mouth daily.     Marland Kitchen lurasidone (LATUDA) 40 MG TABS tablet Take 40 mg by mouth daily with breakfast.    . magnesium oxide (MAG-OX) 400 (241.3 Mg) MG tablet Take by mouth 2 (two) times a day.    . meclizine (ANTIVERT) 25 MG tablet Take 25 mg by mouth 3 (three) times daily as needed.    . Melatonin 5 MG TABS Take 3 mg by mouth at bedtime.    . metoCLOPramide (REGLAN) 10 MG tablet Take 1 tablet (10 mg total) by mouth every 6 (six) hours as needed for nausea or vomiting. 12 tablet 0  . Multiple Vitamin (MULTIVITAMIN WITH MINERALS) TABS Take 1 tablet by mouth daily.    . pantoprazole (PROTONIX) 40 MG tablet Take 40 mg by mouth daily.    . pregabalin (LYRICA)  150 MG capsule Take 150 mg by mouth 2 (two) times daily.    . promethazine (PHENERGAN) 25 MG tablet Take 25 mg by mouth every 8 (eight) hours as needed.    . traMADol (ULTRAM) 50 MG tablet Take 100 mg by mouth every 8 (eight) hours as needed.     Marland Kitchen atenolol (TENORMIN) 50 MG tablet Take 1 tablet (50 mg total) by mouth daily. 30 tablet 2  . Dulaglutide 0.75 MG/0.5ML SOPN Inject into the skin.    Marland Kitchen estrogens, conjugated, (PREMARIN) 0.625 MG tablet Take by mouth.    . fluticasone (FLONASE) 50 MCG/ACT nasal spray Place 2 sprays into both nostrils 2 (two) times daily as needed.   2  . ondansetron (ZOFRAN) 4 MG tablet Take 1 tablet (4 mg total) by mouth every 6 (six) hours. 12 tablet 0  . ondansetron  (ZOFRAN-ODT) 8 MG disintegrating tablet Take by mouth.     No facility-administered medications prior to visit.   Radiology:   No results found.  Cardiac Studies:   Echocardiogram 09/30/2016: Normal LV size, normal LV systolic function, EF 50-93% without regional wall motion abnormality.  Stress test- 10/13/12 Negative for ischemia, there was no significant ST.   depression c.f. resting EKG. Walked for 7 minutes.   Peak HR- 174/min ( 93% MPHR), achieved 8.2 METS.  EKG  EKG 09/17/2019: Sinus tachycardia at the rate of 105 bpm, normal axis, poor R wave progression, probably normal variant.  No evidence of ischemia.  No significant change from prior EKG.  Assessment     ICD-10-CM   1. Acquired hypertriglyceridemia  E78.1   2. Primary hypertension  I10 amLODipine (NORVASC) 10 MG tablet    atenolol (TENORMIN) 50 MG tablet  3. Binge eating disorder  F50.81   4. Stage 3b chronic kidney disease  N18.32      Medications Discontinued During This Encounter  Medication Reason  . Dulaglutide 0.75 MG/0.5ML SOPN Discontinued by provider  . fluticasone (FLONASE) 50 MCG/ACT nasal spray Patient Preference  . ondansetron (ZOFRAN) 4 MG tablet Discontinued by provider  . ondansetron (ZOFRAN-ODT) 8 MG disintegrating tablet Discontinued by provider  . estrogens, conjugated, (PREMARIN) 0.625 MG tablet Patient Preference  . atenolol (TENORMIN) 50 MG tablet Reorder    Meds ordered this encounter  Medications  . amLODipine (NORVASC) 10 MG tablet    Sig: Take 1 tablet (10 mg total) by mouth daily.    Dispense:  30 tablet    Refill:  2  . atenolol (TENORMIN) 50 MG tablet    Sig: Take 1 tablet (50 mg total) by mouth daily.    Dispense:  90 tablet    Refill:  3    Recommendations:   Brandi Love is a 44 y.o. eating disorder, depression with psychosis, borderline personality disorder, OCD, hypothyroidism, deafness with cochlear implants, and chronic kidney disease stage III, and is attributed to  NSAID use and also patient once try to commit suicide by consuming excessive amounts of aspirin in the remote past, DM.  She is being followed at [facility] for familial hypertriglyceridemia.   She underwent right salpingo-oophorectomy on 09/02/2019 without periprocedural cardiac complications and found to have benign tumor.  She has noticed her blood pressure to be markedly elevated. Suspect it is related to her diet as well. I have added amlodipine 5 mg daily. She does have mild fatigue with atenolol that she is on but she is tolerating this and she takes it in the evening.  I reviewed  her external labs, she has marked hypertriglyceridemia, she is under [place] endocrinology program/lipid clinic and it has been difficult to control her triglycerides in view of her bulimia and eating disorder. I given her different ways she could cope up with diet and also easier way of cooking, [facility] low glycemic diet sheet was also provided to the patient. I would like to see her back in 4 to 6 weeks for follow-up of hypertension.  she has allergy to shellfish and is unable to take any kind of omega-3 fatty acids.  Extremely difficult situation especially in view of her underlying psychiatric issues and patient not cooking and essentially eating frozen dinners mostly.  She now has uncontrolled diabetes as well.   Adrian Prows, MD, [facility] 12/29/2019, 9:13 PM Office: [phone]

## 2020-02-02 ENCOUNTER — Ambulatory Visit: Payer: Medicare Other | Admitting: Cardiology

## 2020-02-14 ENCOUNTER — Ambulatory Visit: Payer: Medicare Other | Admitting: Cardiology

## 2020-02-14 ENCOUNTER — Other Ambulatory Visit: Payer: Self-pay

## 2020-02-14 ENCOUNTER — Encounter: Payer: Self-pay | Admitting: Cardiology

## 2020-02-14 VITALS — BP 112/77 | HR 86 | Resp 16 | Ht 68.0 in | Wt 174.0 lb

## 2020-02-14 DIAGNOSIS — N1832 Chronic kidney disease, stage 3b: Secondary | ICD-10-CM

## 2020-02-14 DIAGNOSIS — E781 Pure hyperglyceridemia: Secondary | ICD-10-CM

## 2020-02-14 DIAGNOSIS — F5081 Binge eating disorder: Secondary | ICD-10-CM

## 2020-02-14 DIAGNOSIS — I10 Essential (primary) hypertension: Secondary | ICD-10-CM

## 2020-02-14 NOTE — Progress Notes (Signed)
Primary Physician/Referring:  Bernerd Limbo, MD  Patient ID: Brandi Love, female    DOB: 1976-01-13, 44 y.o.   MRN: [id number]  Chief Complaint  Patient presents with  . Follow-up    6 week  . Hypertension   HPI:    Brandi Love  is a 44 y.o. female with history of diabetes mellitus, hypothyroidism, deafness with cochlear implants, chronic kidney disease stage III attributed to NSAID use and remote history of attempted suicide with excessive amounts of aspirin.  She also has a history of depression with psychosis, borderline personality disorder, OCD, multiple hospital admissions for eating disorder and depression.  She is followed at [facility] for familial hypertriglyceridemia.  09/02/2019 underwent right salpingo-oophorectomy revealing benign tumor and without perioperative cardiac complications.  Patient presents for 6-week follow-up of hypertension.  At previous visit she was started on amlodipine 10 mg daily in addition to atenolol 50 mg daily.  She is tolerating this without issue.  She reports she continues to eat frozen meals primarily as well as eating out frequently.  She admits she does not cook.  She denies chest pain, shortness of breath, palpitations, dizziness, headache.  However she does report recent episode lasting a few days of vomiting and diarrhea.  She states since this she has felt increased fatigue and weakness for the last several days. She has appointment with PCP later this week.   Past Medical History:  Diagnosis Date  . Anemia   . Anorexia   . Anxiety   . Arthritis    "hands" (09/18/2016)  . Chronic kidney disease   . Chronic lower back pain   . Chronic renal insufficiency   . Deaf   . Deafness   . Depression   . Diabetes ([place])    "borderline"  . Eating disorder   . Endometriosis   . GERD (gastroesophageal reflux disease)   . History of kidney stones   . Hyperlipidemia   . 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 MDD (major  depressive disorder)   . Migraine    "frequency varies" (09/18/2016)  . OCD (obsessive compulsive disorder)   . Pneumonia 2011  . PONV (postoperative nausea and vomiting)   . Pulmonary embolism ([place]) 10/2001  . Seizures ([place]) 09/2001 X 1   "day after my hysterectomy"  . Seizures ([place])   . Self mutilating behavior   . Tachycardia    Past Surgical History:  Procedure Laterality Date  . ABDOMINAL HYSTERECTOMY  09/2001   'except for right ovary'  . BREAST LUMPECTOMY Left 2000   benign  . BREAST LUMPECTOMY Right 2002   benign  . COCHLEAR IMPLANT Bilateral 2004-2010   "right-left"  . COLONOSCOPY  06/2015    Dr Rolm Bookbinder at [place] for rectal bleeding: internal hemorrhoids, small anal fissure.    . ESOPHAGOGASTRODUODENOSCOPY N/A 10/07/2016   Procedure: ESOPHAGOGASTRODUODENOSCOPY (EGD);  Surgeon: Ladene Artist, MD;  Location: [facility] ENDOSCOPY;  Service: Endoscopy;  Laterality: N/A;  . EYE MUSCLE SURGERY Left 1980   "lazy eye"  . EYE MUSCLE SURGERY Right 1990   "lazy eye"  . LAPAROSCOPIC ABDOMINAL EXPLORATION  2001 and 2002   for endometriosis  . OOPHORECTOMY Right 08/2019   Family History  Problem Relation Age of Onset  . Cancer Father   . Hyperlipidemia Father   . Osteoarthritis Mother   . Hyperlipidemia Mother     Social History   Tobacco Use  . Smoking status: Never Smoker  .  Smokeless tobacco: Never Used  Substance Use Topics  . Alcohol use: Never   Marital Status: Single  ROS  Review of Systems  Cardiovascular: Negative for chest pain, dyspnea on exertion and leg swelling.  Gastrointestinal: Negative for melena.   Objective  Blood pressure 112/77, pulse 86, resp. rate 16, height 5\' 8"  (1.727 m), weight 174 lb (78.9 kg), SpO2 97 %.  Vitals with BMI 02/14/2020 12/29/2019 11/16/2019  Height 5\' 8"  5\' 8"  -  Weight 174 lbs 172 lbs -  BMI 87.56 43.32 -  Systolic 951 884 166  Diastolic 77 063 016  Pulse 86 87 104     Physical Exam Cardiovascular:     Rate and  Rhythm: Normal rate and regular rhythm.     Pulses: Normal pulses and intact distal pulses.     Heart sounds: Normal heart sounds. No murmur heard.  No gallop.      Comments: No leg edema, no JVD. No xanthelasma or corneal arcus noted.  Pulmonary:     Effort: Pulmonary effort is normal.     Breath sounds: Normal breath sounds.  Abdominal:     General: Bowel sounds are normal.     Palpations: Abdomen is soft.    Laboratory examination:   Recent Labs    08/12/19 2055 08/12/19 2055 08/12/19 2056 11/06/19 1841 11/16/19 1020  NA 124*   < > 127* 133* 134*  K 4.7   < > 4.9 4.3 5.1  CL 93*  --   --  100 99  CO2 17*  --   --  20* 20*  GLUCOSE 592*  --   --  74 159*  BUN 41*  --   --  31* 33*  CREATININE 1.81*  --   --  1.67* 1.64*  CALCIUM 9.5  --   --  9.9 9.8  GFRNONAA 34*  --   --  38* 38*  GFRAA 40*  --   --  44* 45*   < > = values in this interval not displayed.   CrCl cannot be calculated (Patient's most recent lab result is older than the maximum 21 days allowed.).  CMP Latest Ref Rng & Units 11/16/2019 11/06/2019 08/12/2019  Glucose 70 - 99 mg/dL 159(H) 74 -  BUN 6 - 20 mg/dL 33(H) 31(H) -  Creatinine 0.44 - 1.00 mg/dL 1.64(H) 1.67(H) -  Sodium 135 - 145 mmol/L 134(L) 133(L) 127(L)  Potassium 3.5 - 5.1 mmol/L 5.1 4.3 4.9  Chloride 98 - 111 mmol/L 99 100 -  CO2 22 - 32 mmol/L 20(L) 20(L) -  Calcium 8.9 - 10.3 mg/dL 9.8 9.9 -  Total Protein 6.5 - 8.1 g/dL 7.5 6.8 -  Total Bilirubin 0.3 - 1.2 mg/dL 0.5 0.3 -  Alkaline Phos 38 - 126 U/L 88 89 -  AST 15 - 41 U/L 14(L) 13(L) -  ALT 0 - 44 U/L 12 10 -   CBC Latest Ref Rng & Units 11/16/2019 11/06/2019 08/12/2019  WBC 4.0 - 10.5 K/uL 9.1 8.8 -  Hemoglobin 12.0 - 15.0 g/dL 13.5 13.0 11.9(L)  Hematocrit 36 - 46 % 38.7 36.8 35.0(L)  Platelets 150 - 400 K/uL 307 282 -    Lipid Panel No results for input(s): CHOL, TRIG, LDLCALC, VLDL, HDL, CHOLHDL, LDLDIRECT in the last 8760 hours.  HEMOGLOBIN A1C Lab Results  Component  Value Date   HGBA1C 5.5 10/10/2016   MPG 111 10/10/2016   TSH Recent Labs    03/02/19 1526  TSH 1.258  External labs:   Labs 11/08/2019:  Total cholesterol 477, triglycerides 4259, HDL 31, LDL not calculated.  Labs 10/28/2019: TSH minimally elevated at 4.680.  Free T4 normal.  Medications and allergies   Allergies  Allergen Reactions  . Fish Oil Anaphylaxis    Facial edema, itching  . Iodinated Diagnostic Agents Anaphylaxis    'cant breathe'  . Melatonin Itching  . Other Itching, Swelling, Rash and Anaphylaxis    Unable to take due to renal insufficency Unable to ttake due to renal insufficency Eyes swell 'cant breathe' 'cant breathe' 'cant breathe' 'cant breathe' Other reaction(s): Other (See Comments) Patient stated she can not take because she has renal insufficiency Unable to ttake due to renal insufficency 'cant breathe'  . Phenylephrine-Guaifenesin Shortness Of Breath and Swelling    Throat swelling  . Propoxyphene Anaphylaxis  . Abilify [Aripiprazole] Nausea And Vomiting  . Dhea [Nutritional Supplements] Nausea And Vomiting and Swelling  . Dihydroergotamine Nausea And Vomiting  . Divalproex Sodium Nausea And Vomiting and Other (See Comments)    Weight gain  . Doxycycline Nausea And Vomiting  . Erythromycin Nausea And Vomiting  . Indomethacin Nausea And Vomiting  . Lactose Intolerance (Gi) Nausea And Vomiting  . Melatonin Itching  . Risperidone And Related Other (See Comments)    Out of body feeling  . Shellfish Allergy Itching and Swelling    Eyes swell  . Statins Other (See Comments)    Cannot tolerate  . Tape Other (See Comments)    Redness/ please use paper tape  . Dihydroergotamine Nausea And Vomiting  . Lactose Intolerance (Gi) Nausea And Vomiting  . Nsaids Other (See Comments)    Unable to ttake due to renal insufficency  . Sulfa Antibiotics Itching    Redness/ please use paper tape Redness, can use paper tape  . Tape Other (See  Comments)    Redness, can use paper tape     Outpatient Medications Prior to Visit  Medication Sig Dispense Refill  . acetaminophen (TYLENOL) 500 MG tablet Take 1,000-3,000 mg by mouth See admin instructions. Take 2 tablets in the morning, at lunch and take 6 tablets at bedtime    . allopurinol (ZYLOPRIM) 300 MG tablet Take by mouth.    Marland Kitchen amLODipine (NORVASC) 10 MG tablet Take 1 tablet (10 mg total) by mouth daily. 30 tablet 2  . aspirin EC 81 MG tablet Take 81 mg by mouth daily.    Marland Kitchen atenolol (TENORMIN) 50 MG tablet Take 1 tablet (50 mg total) by mouth daily. 90 tablet 3  . cyclobenzaprine (FLEXERIL) 5 MG tablet Take by mouth 3 (three) times daily as needed.     . D3-50 1.25 MG (50000 UT) capsule Take 50,000 Units by mouth once a week.    . dicyclomine (BENTYL) 20 MG tablet Take 1 tablet (20 mg total) by mouth 2 (two) times daily. 20 tablet 0  . docusate sodium (COLACE) 100 MG capsule Take 2 capsules at bedtime    . gemfibrozil (LOPID) 600 MG tablet 600 mg in the morning and 300 mg in the evening    . glimepiride (AMARYL) 4 MG tablet Take 4 mg by mouth daily.    Marland Kitchen levothyroxine (SYNTHROID) 137 MCG tablet Take 150 mcg by mouth daily.     Marland Kitchen lurasidone (LATUDA) 40 MG TABS tablet Take 40 mg by mouth daily with breakfast.    . magnesium oxide (MAG-OX) 400 (241.3 Mg) MG tablet Take by mouth 2 (two) times a day.    Marland Kitchen  meclizine (ANTIVERT) 25 MG tablet Take 25 mg by mouth 3 (three) times daily as needed.    . Melatonin 5 MG TABS Take 3 mg by mouth at bedtime.    . metoCLOPramide (REGLAN) 10 MG tablet Take 1 tablet (10 mg total) by mouth every 6 (six) hours as needed for nausea or vomiting. 12 tablet 0  . Multiple Vitamin (MULTIVITAMIN WITH MINERALS) TABS Take 1 tablet by mouth daily.    . pantoprazole (PROTONIX) 40 MG tablet Take 40 mg by mouth daily.    . pregabalin (LYRICA) 150 MG capsule Take 150 mg by mouth 2 (two) times daily.    . promethazine (PHENERGAN) 25 MG tablet Take 25 mg by mouth  every 8 (eight) hours as needed.    . traMADol (ULTRAM) 50 MG tablet Take 100 mg by mouth every 8 (eight) hours as needed.      No facility-administered medications prior to visit.   Radiology:   No results found.  Cardiac Studies:   Echocardiogram 09/30/2016: Normal LV size, normal LV systolic function, EF 39-03% without regional wall motion abnormality.  Stress test- 10/13/12 Negative for ischemia, there was no significant ST.   depression c.f. resting EKG. Walked for 7 minutes.   Peak HR- 174/min ( 93% MPHR), achieved 8.2 METS.  EKG   EKG 09/17/2019: Sinus tachycardia at the rate of 105 bpm, normal axis, poor R wave progression, probably normal variant.  No evidence of ischemia.  No significant change from prior EKG.  Assessment     ICD-10-CM   1. Primary hypertension  I10   2. Stage 3b chronic kidney disease (HCC)  N18.32   3. Acquired hypertriglyceridemia  E78.1   4. Binge eating disorder  F50.81      There are no discontinued medications.  No orders of the defined types were placed in this encounter.   Recommendations:   Trudi Morgenthaler Fout is a 44 y.o. female with history of diabetes mellitus, hypothyroidism, deafness with cochlear implants, chronic kidney disease stage III attributed to NSAID use and remote history of attempted suicide with excessive amounts of aspirin.  She also has a history of depression with psychosis, borderline personality disorder, OCD, multiple hospital admissions for eating disorder and depression.  She is [facility] at [facility] for familial hypertriglyceridemia.  09/02/2019 underwent right salpingo-oophorectomy revealing benign tumor and without perioperative cardiac complications.  Patient's blood pressure is presently well controlled, will continue atenolol 50 mg daily and amlodipine 10 mg daily.  Discussed with patient importance of healthy diet once again.  She continues to eat out of the house frequently as well as frozen meals when she does  eat at home.  Patient reported that her endocrinologist questioned if medications may be contributing to hyper triglyceridemia.  Reviewed medication list today, however do not believe medications to be a significant contributor to hyper triglyceridemia.  Primary driver of elevated triglycerides seems to be patient's poor diet.  Patient continues follow with [facility] endocrinology program/lipid clinic, will defer further management for hypertriglyceridemia to them.  Triglycerides have been difficult to control in view of her eating disorder as well as diet and inability to take omega-3 fatty acids.  Recommend patient continue to follow closely with [facility] endocrinology.  Also recommend patient follow-up with PCP regarding recent episode suggestive of gastroenteritis.  Follow-up in 1 year for hypertension, hypercholesterolemia.  Patient was seen in collaboration with Dr. Einar Gip. He also reviewed patient's chart and examined the patient. Dr. Einar Gip is in agreement of the  plan.    Alethia Berthold, PA-C 02/14/2020, 3:10 PM Office: [phone]

## 2020-02-17 ENCOUNTER — Other Ambulatory Visit: Payer: Self-pay

## 2020-02-17 DIAGNOSIS — I10 Essential (primary) hypertension: Secondary | ICD-10-CM

## 2020-02-17 MED ORDER — AMLODIPINE BESYLATE 10 MG PO TABS: 10.0000 mg | ORAL_TABLET | Freq: Every day | ORAL | 2 refills | Status: DC

## 2020-03-01 ENCOUNTER — Other Ambulatory Visit: Payer: Self-pay

## 2020-03-01 ENCOUNTER — Emergency Department (HOSPITAL_BASED_OUTPATIENT_CLINIC_OR_DEPARTMENT_OTHER)
Admission: EM | Admit: 2020-03-01 | Discharge: 2020-03-02 | Disposition: A | Discharge status: Home / Self Care | Payer: Medicare Other | Attending: Emergency Medicine | Admitting: Emergency Medicine

## 2020-03-01 ENCOUNTER — Encounter (HOSPITAL_BASED_OUTPATIENT_CLINIC_OR_DEPARTMENT_OTHER): Payer: Self-pay

## 2020-03-01 ENCOUNTER — Emergency Department (HOSPITAL_BASED_OUTPATIENT_CLINIC_OR_DEPARTMENT_OTHER): Payer: Medicare Other

## 2020-03-01 DIAGNOSIS — R1084 Generalized abdominal pain: Secondary | ICD-10-CM | POA: Diagnosis not present

## 2020-03-01 DIAGNOSIS — E1122 Type 2 diabetes mellitus with diabetic chronic kidney disease: Secondary | ICD-10-CM | POA: Insufficient documentation

## 2020-03-01 DIAGNOSIS — Z7984 Long term (current) use of oral hypoglycemic drugs: Secondary | ICD-10-CM | POA: Diagnosis not present

## 2020-03-01 DIAGNOSIS — Z79899 Other long term (current) drug therapy: Secondary | ICD-10-CM | POA: Diagnosis not present

## 2020-03-01 DIAGNOSIS — K219 Gastro-esophageal reflux disease without esophagitis: Secondary | ICD-10-CM | POA: Insufficient documentation

## 2020-03-01 DIAGNOSIS — E1165 Type 2 diabetes mellitus with hyperglycemia: Secondary | ICD-10-CM | POA: Diagnosis present

## 2020-03-01 DIAGNOSIS — N179 Acute kidney failure, unspecified: Secondary | ICD-10-CM

## 2020-03-01 DIAGNOSIS — R5383 Other fatigue: Secondary | ICD-10-CM | POA: Insufficient documentation

## 2020-03-01 DIAGNOSIS — R739 Hyperglycemia, unspecified: Secondary | ICD-10-CM

## 2020-03-01 DIAGNOSIS — Z7982 Long term (current) use of aspirin: Secondary | ICD-10-CM | POA: Insufficient documentation

## 2020-03-01 DIAGNOSIS — I129 Hypertensive chronic kidney disease with stage 1 through stage 4 chronic kidney disease, or unspecified chronic kidney disease: Secondary | ICD-10-CM | POA: Diagnosis not present

## 2020-03-01 DIAGNOSIS — N183 Chronic kidney disease, stage 3 unspecified: Secondary | ICD-10-CM | POA: Insufficient documentation

## 2020-03-01 DIAGNOSIS — E039 Hypothyroidism, unspecified: Secondary | ICD-10-CM | POA: Diagnosis not present

## 2020-03-01 DIAGNOSIS — R509 Fever, unspecified: Secondary | ICD-10-CM | POA: Diagnosis not present

## 2020-03-01 DIAGNOSIS — R42 Dizziness and giddiness: Secondary | ICD-10-CM | POA: Insufficient documentation

## 2020-03-01 LAB — CBC WITH DIFFERENTIAL/PLATELET
Abs Immature Granulocytes: 0.02 10*3/uL (ref 0.00–0.07)
Basophils Absolute: 0.1 10*3/uL (ref 0.0–0.1)
Basophils Relative: 1 %
Eosinophils Absolute: 0.3 10*3/uL (ref 0.0–0.5)
Eosinophils Relative: 5 %
HCT: 29.5 % — ABNORMAL LOW (ref 36.0–46.0)
Hemoglobin: 10.8 g/dL — ABNORMAL LOW (ref 12.0–15.0)
Immature Granulocytes: 0 %
Lymphocytes Relative: 35 %
Lymphs Abs: 1.7 10*3/uL (ref 0.7–4.0)
MCH: 33 pg (ref 26.0–34.0)
MCHC: 36.6 g/dL — ABNORMAL HIGH (ref 30.0–36.0)
MCV: 90.2 fL (ref 80.0–100.0)
Monocytes Absolute: 0.4 10*3/uL (ref 0.1–1.0)
Monocytes Relative: 9 %
Neutro Abs: 2.4 10*3/uL (ref 1.7–7.7)
Neutrophils Relative %: 50 %
Platelets: 208 10*3/uL (ref 150–400)
RBC: 3.27 MIL/uL — ABNORMAL LOW (ref 3.87–5.11)
RDW: 13.2 % (ref 11.5–15.5)
WBC: 4.9 10*3/uL (ref 4.0–10.5)
nRBC: 0 % (ref 0.0–0.2)

## 2020-03-01 LAB — URINALYSIS, ROUTINE W REFLEX MICROSCOPIC
Bilirubin Urine: NEGATIVE
Glucose, UA: >=500 mg/dL — AB
Hgb urine dipstick: NEGATIVE
Ketones, ur: NEGATIVE mg/dL
Leukocytes,Ua: NEGATIVE
Nitrite: NEGATIVE
Protein, ur: NEGATIVE mg/dL
Specific Gravity, Urine: <1.005 — ABNORMAL LOW (ref 1.005–1.030)
pH: 6 (ref 5.0–8.0)

## 2020-03-01 LAB — TROPONIN I (HIGH SENSITIVITY)
Troponin I (High Sensitivity): 3 ng/L (ref <18)
Troponin I (High Sensitivity): 3 ng/L (ref <18)

## 2020-03-01 LAB — I-STAT VENOUS BLOOD GAS, ED
Acid-base deficit: 5 mmol/L — ABNORMAL HIGH (ref 0.0–2.0)
Bicarbonate: 20.2 mmol/L (ref 20.0–28.0)
Calcium, Ion: 1.33 mmol/L (ref 1.15–1.40)
HCT: 45 % (ref 36.0–46.0)
Hemoglobin: 15.3 g/dL — ABNORMAL HIGH (ref 12.0–15.0)
O2 Saturation: 85 %
Patient temperature: 98.3
Potassium: 4.3 mmol/L (ref 3.5–5.1)
Sodium: 131 mmol/L — ABNORMAL LOW (ref 135–145)
TCO2: 21 mmol/L — ABNORMAL LOW (ref 22–32)
pCO2, Ven: 38.3 mmHg — ABNORMAL LOW (ref 44.0–60.0)
pH, Ven: 7.329 (ref 7.250–7.430)
pO2, Ven: 53 mmHg — ABNORMAL HIGH (ref 32.0–45.0)

## 2020-03-01 LAB — COMPREHENSIVE METABOLIC PANEL
ALT: 17 U/L (ref 0–44)
AST: 13 U/L — ABNORMAL LOW (ref 15–41)
Albumin: 3.4 g/dL — ABNORMAL LOW (ref 3.5–5.0)
Alkaline Phosphatase: 77 U/L (ref 38–126)
Anion gap: 17 — ABNORMAL HIGH (ref 5–15)
BUN: 37 mg/dL — ABNORMAL HIGH (ref 6–20)
CO2: 16 mmol/L — ABNORMAL LOW (ref 22–32)
Calcium: 9.5 mg/dL (ref 8.9–10.3)
Chloride: 96 mmol/L — ABNORMAL LOW (ref 98–111)
Creatinine, Ser: 2.05 mg/dL — ABNORMAL HIGH (ref 0.44–1.00)
GFR, Estimated: 31 mL/min — ABNORMAL LOW (ref >60)
Glucose, Bld: 465 mg/dL — ABNORMAL HIGH (ref 70–99)
Potassium: 4.2 mmol/L (ref 3.5–5.1)
Sodium: 129 mmol/L — ABNORMAL LOW (ref 135–145)
Total Bilirubin: 0.6 mg/dL (ref 0.3–1.2)
Total Protein: 6.8 g/dL (ref 6.5–8.1)

## 2020-03-01 LAB — CBG MONITORING, ED
Glucose-Capillary: 460 mg/dL — ABNORMAL HIGH (ref 70–99)
Glucose-Capillary: 388 mg/dL — ABNORMAL HIGH (ref 70–99)

## 2020-03-01 LAB — URINALYSIS, MICROSCOPIC (REFLEX)

## 2020-03-01 MED ORDER — SODIUM CHLORIDE 0.9 % IV BOLUS
1000.0000 mL | Freq: Once | INTRAVENOUS | Status: AC
  Administered 2020-03-01: 1000 mL via INTRAVENOUS

## 2020-03-01 MED ORDER — TRAMADOL HCL 50 MG PO TABS
100.0000 mg | ORAL_TABLET | Freq: Once | ORAL | Status: DC
  Filled 2020-03-01: qty 2

## 2020-03-01 NOTE — ED Notes (Signed)
Pt returned to room  

## 2020-03-01 NOTE — ED Triage Notes (Signed)
Pt complaint of elevated BGL when she checked for the last week, pt complaint of N/V/D, pt complaint of Dr calling today and telling her to come to ED for elevated BGL.

## 2020-03-01 NOTE — ED Provider Notes (Addendum)
Brandi Love   CSN: [id number] Arrival date & time: 03/01/20  3875     History Chief Complaint  Patient presents with  . Hyperglycemia    home 485   Interview conducted utilizing ASL interpreter [place], [phone].  Brandi Love is a 44 y.o. female who presents with concern for elevated blood sugars, > 500 her nephrologist today -she was directed to emergency department.  She is a type II diabetic, states she has been taking her medications at home, however has not been monitoring her blood sugars for the past several weeks, as she does not like to.  She states that she has been feeling nauseous, vomiting approximately once per day (nonbloody nonbilious), multiple soft bowel movements per day (nonbloody, denies hematochezia, melena) x 1 week.  She states because of this she has not been able to eat or drink normally for the past 1-2 weeks.  She endorses intermittent generalized abdominal pain, sensation of fatigue, lightheadedness.  She endorses sensation of abdominal pressure when she urinates.  She states she has prescriptions for Zofran and Phenergan at home, she has been taking both of these medications at home with minimal relief of her nausea. Endorses fever at home, T-max of 100.3 degrees.  She also endorses central chest pressure for the last week, that radiates to her left shoulder.  She denies shortness of breath, palpitations.  I personally reviewed the patient's medical records.  She has history of fibromyalgia, MDD, congenital deafness, diabetes type 2, hypothyroidism, hyperlipidemia, OCD, ovarian tumor that was recently removed in 08/2019.  She has never been hospitalized due to elevate blood glucose in the past.   HPI     Past Medical History:  Diagnosis Date  . Anemia   . Anorexia   . Anxiety   . Arthritis    "hands" (09/18/2016)  . Chronic kidney disease   . Chronic lower back pain   . Chronic renal insufficiency   .  Deaf   . Deafness   . Depression   . Diabetes ([place])    "borderline"  . Eating disorder   . Endometriosis   . GERD (gastroesophageal reflux disease)   . History of kidney stones   . Hyperlipidemia   . 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 MDD (major depressive disorder)   . Migraine    "frequency varies" (09/18/2016)  . OCD (obsessive compulsive disorder)   . Pneumonia 2011  . PONV (postoperative nausea and vomiting)   . Pulmonary embolism ([place]) 10/2001  . Seizures ([place]) 09/2001 X 1   "day after my hysterectomy"  . Seizures ([place])   . Self mutilating behavior   . Tachycardia     Patient Active Problem List   Diagnosis Date Noted  . Deaf 10/01/2018  . Hematochezia 10/01/2018  . Hypertension 10/01/2018  . Loss of appetite 10/01/2018  . Mental health problem 10/01/2018  . Seizures ([place]) 10/01/2018  . Abdominal pain 07/02/2018  . Gastroesophageal reflux disease without esophagitis 02/16/2018  . Primary insomnia 02/16/2018  . Prediabetes 01/28/2018  . Type 2 diabetes mellitus without complication, without long-term current use of insulin ([place]) 01/28/2018  . Syncope 08/26/2017  . Acute renal failure superimposed on stage 3 chronic kidney disease ([place]) 08/26/2017  . Orthostatic syncope   . Self mutilating behavior 08/08/2017  . Acne 06/11/2017  . Self-care deficit for bathing 06/11/2017  . Urinary tract infection with hematuria   . OCD (obsessive compulsive disorder) 09/29/2016  .  Anorexia nervosa with bulimia 09/29/2016  . Deafness congenital 09/29/2016  . Intractable nausea and vomiting 09/29/2016  . Bloody diarrhea 09/29/2016  . AKI (acute kidney injury) ([place]) 09/29/2016  . Severe dehydration 09/29/2016  . Severe protein-calorie malnutrition ([place]) 09/29/2016  . Congenital hypothyroidism 09/29/2016  . Hypertriglyceridemia 09/29/2016  . Anemia 09/29/2016  . Endometriosis 09/29/2016  . MDD (major depressive disorder) 09/29/2016  . Hyperlipidemia  09/29/2016  . CKD (chronic kidney disease), stage III ([place]) 09/29/2016  . Acute kidney injury ([place]) 09/29/2016  . OAB (overactive bladder) 09/29/2016  . Sepsis ([place]) 09/18/2016  . Acute pyelonephritis 09/18/2016  . Urinary incontinence 08/09/2015  . MDD (major depressive disorder), recurrent, severe, with psychosis ([place]) 12/11/2014  . Severe recurrent major depressive disorder with psychotic symptoms ([place]) 12/11/2014  . Migraine 07/02/2013  . Eating disorder, unspecified 11/23/2012  . Hypothyroidism 11/23/2012  . Dyslipidemia 11/23/2012  . Sinus tachycardia 11/23/2012  . CKD (chronic kidney disease) stage 3, GFR 30-59 ml/min ([facility]) 11/18/2012  . Protein-calorie malnutrition, severe ([place]) 11/13/2012  . Abdominal pain, epigastric 11/12/2012  . Dehydration 11/12/2012  . Acute on chronic renal failure ([place]) 11/12/2012  . Leukocytosis, unspecified 11/12/2012  . Hyponatremia 11/12/2012  . Borderline personality disorder ([place]) 11/12/2012    Past Surgical History:  Procedure Laterality Date  . ABDOMINAL HYSTERECTOMY  09/2001   'except for right ovary'  . BREAST LUMPECTOMY Left 2000   benign  . BREAST LUMPECTOMY Right 2002   benign  . COCHLEAR IMPLANT Bilateral 2004-2010   "right-left"  . COLONOSCOPY  06/2015    Dr Rolm Bookbinder at [place] for rectal bleeding: internal hemorrhoids, small anal fissure.    . ESOPHAGOGASTRODUODENOSCOPY N/A 10/07/2016   Procedure: ESOPHAGOGASTRODUODENOSCOPY (EGD);  Surgeon: Ladene Artist, MD;  Location: [facility] ENDOSCOPY;  Service: Endoscopy;  Laterality: N/A;  . EYE MUSCLE SURGERY Left 1980   "lazy eye"  . EYE MUSCLE SURGERY Right 1990   "lazy eye"  . LAPAROSCOPIC ABDOMINAL EXPLORATION  2001 and 2002   for endometriosis  . OOPHORECTOMY Right 08/2019     OB History   No obstetric history on file.     Family History  Problem Relation Age of Onset  . Cancer Father   . Hyperlipidemia Father   . Osteoarthritis Mother   . Hyperlipidemia Mother       Social History   Tobacco Use  . Smoking status: Never Smoker  . Smokeless tobacco: Never Used  Vaping Use  . Vaping Use: Never used  Substance Use Topics  . Alcohol use: Never  . Drug use: Never    Home Medications Prior to Admission medications   Medication Sig Start Date End Date Taking? Authorizing Provider  acetaminophen (TYLENOL) 500 MG tablet Take 1,000-3,000 mg by mouth See admin instructions. Take 2 tablets in the morning, at lunch and take 6 tablets at bedtime    [provider]  allopurinol (ZYLOPRIM) 300 MG tablet Take by mouth. 08/12/19   [provider]  amLODipine (NORVASC) 10 MG tablet Take 1 tablet (10 mg total) by mouth daily. 02/17/20 05/17/20  Adrian Prows, MD  aspirin EC 81 MG tablet Take 81 mg by mouth daily.    [provider]  atenolol (TENORMIN) 50 MG tablet Take 1 tablet (50 mg total) by mouth daily. 12/29/19 12/23/20  Adrian Prows, MD  cyclobenzaprine (FLEXERIL) 5 MG tablet Take by mouth 3 (three) times daily as needed.  06/30/18   [provider]  D3-50 1.25 MG (50000 UT) capsule Take  50,000 Units by mouth once a week. 08/08/19   [provider]  dicyclomine (BENTYL) 20 MG tablet Take 1 tablet (20 mg total) by mouth 2 (two) times daily. 11/16/19   Couture, Cortni S, PA-C  docusate sodium (COLACE) 100 MG capsule Take 2 capsules at bedtime 07/14/18   [provider]  gemfibrozil (LOPID) 600 MG tablet 600 mg in the morning and 300 mg in the evening 09/09/17   [provider]  glimepiride (AMARYL) 4 MG tablet Take 4 mg by mouth daily. 06/15/19   [provider]  levothyroxine (SYNTHROID) 137 MCG tablet Take 150 mcg by mouth daily.  07/17/17   [provider]  lurasidone (LATUDA) 40 MG TABS tablet Take 40 mg by mouth daily with breakfast.    [provider]  magnesium oxide (MAG-OX) 400 (241.3 Mg) MG tablet Take by mouth 2 (two) times a day. 11/06/17   [provider]  meclizine  (ANTIVERT) 25 MG tablet Take 25 mg by mouth 3 (three) times daily as needed. 06/03/19   [provider]  Melatonin 5 MG TABS Take 3 mg by mouth at bedtime.    [provider]  metoCLOPramide (REGLAN) 10 MG tablet Take 1 tablet (10 mg total) by mouth every 6 (six) hours as needed for nausea or vomiting. 11/07/19   Molpus, John, MD  Multiple Vitamin (MULTIVITAMIN WITH MINERALS) TABS Take 1 tablet by mouth daily.    [provider]  pantoprazole (PROTONIX) 40 MG tablet Take 40 mg by mouth daily.    [provider]  pregabalin (LYRICA) 150 MG capsule Take 150 mg by mouth 2 (two) times daily. 08/10/19   [provider]  promethazine (PHENERGAN) 25 MG tablet Take 25 mg by mouth every 8 (eight) hours as needed. 06/02/19   [provider]  traMADol (ULTRAM) 50 MG tablet Take 100 mg by mouth every 8 (eight) hours as needed.  07/19/19   [provider]    Allergies    Fish oil, Iodinated diagnostic agents, Melatonin, Other, Phenylephrine-guaifenesin, Propoxyphene, Abilify [aripiprazole], Dhea [nutritional supplements], Dihydroergotamine, Divalproex sodium, Doxycycline, Erythromycin, Indomethacin, Lactose intolerance (gi), Melatonin, Risperidone and related, Shellfish allergy, Statins, Tape, Dihydroergotamine, Lactose intolerance (gi), Nsaids, Sulfa antibiotics, and Tape  Review of Systems   Review of Systems  Constitutional: Positive for appetite change, fatigue and fever.  HENT: Negative.   Respiratory: Negative for cough, chest tightness, shortness of breath and wheezing.   Cardiovascular: Positive for chest pain. Negative for palpitations and leg swelling.       Central chest pressure  Gastrointestinal: Positive for abdominal pain, diarrhea, nausea and vomiting. Negative for blood in stool and constipation.  Genitourinary: Positive for dysuria. Negative for frequency, hematuria and urgency.  Musculoskeletal: Positive for myalgias.  Skin:  Negative.   Neurological: Positive for dizziness, light-headedness and headaches. Negative for syncope and weakness.  Hematological: Negative.     Physical Exam Updated Vital Signs BP 126/84   Pulse 90   Temp 98.3 F (36.8 C) (Oral)   Resp 15   Ht 5\' 8"  (1.727 m)   Wt 78 kg   LMP  (LMP Unknown)   SpO2 94%   BMI 26.15 kg/m   Physical Exam Vitals and nursing Love reviewed.  HENT:     Head: Normocephalic and atraumatic.     Nose: Nose normal.     Mouth/Throat:     Mouth: Mucous membranes are moist.     Pharynx: No oropharyngeal exudate or posterior oropharyngeal erythema.  Eyes:     General:        Right eye: No discharge.        Left eye: No discharge.     Extraocular Movements: Extraocular movements intact.     Conjunctiva/sclera: Conjunctivae normal.     Pupils: Pupils are equal, round, and reactive to light.  Cardiovascular:     Rate and Rhythm: Normal rate and regular rhythm.     Pulses: Normal pulses.     Heart sounds: Normal heart sounds. No murmur heard.   Pulmonary:     Effort: Pulmonary effort is normal. No respiratory distress.     Breath sounds: Normal breath sounds. No wheezing or rales.  Abdominal:     General: There is no distension.     Palpations: Abdomen is soft.     Tenderness: There is generalized abdominal tenderness.     Comments: Mild tenderness to deep palpation throughout the abdomen.  Musculoskeletal:        General: No deformity.     Cervical back: Neck supple.     Right lower leg: No edema.     Left lower leg: No edema.  Skin:    General: Skin is warm and dry.     Capillary Refill: Capillary refill takes less than 2 seconds.  Neurological:     General: No focal deficit present.     Mental Status: She is alert and oriented to person, place, and time. Mental status is at baseline.  Psychiatric:        Mood and Affect: Mood normal.    ED Results / Procedures / Treatments   Labs (all labs ordered are listed, but only abnormal  results are displayed) Labs Reviewed  CBC WITH DIFFERENTIAL/PLATELET - Abnormal; Notable for the following components:      Result Value   RBC 3.27 (*)    Hemoglobin 10.8 (*)    HCT 29.5 (*)    MCHC 36.6 (*)    All other components within normal limits  COMPREHENSIVE METABOLIC PANEL - Abnormal; Notable for the following components:   Sodium 129 (*)    Chloride 96 (*)    CO2 16 (*)    Glucose, Bld 465 (*)    BUN 37 (*)    Creatinine, Ser 2.05 (*)    Albumin 3.4 (*)    AST 13 (*)    GFR, Estimated 31 (*)    Anion gap 17 (*)    All other components within normal limits  URINALYSIS, ROUTINE W REFLEX MICROSCOPIC - Abnormal; Notable for the following components:   Specific Gravity, Urine <1.005 (*)    Glucose, UA >=500 (*)    All other components within normal limits  URINALYSIS, MICROSCOPIC (REFLEX) - Abnormal; Notable for the following components:   Bacteria, UA RARE (*)    All other components within normal limits  CBG MONITORING, ED - Abnormal; Notable for the following components:   Glucose-Capillary 460 (*)    All other components within normal limits  I-STAT VENOUS BLOOD GAS, ED - Abnormal; Notable for the following components:   pCO2, Ven 38.3 (*)    pO2, Ven 53.0 (*)    TCO2 21 (*)    Acid-base deficit 5.0 (*)    Sodium 131 (*)    Hemoglobin 15.3 (*)    All other components within normal limits  CBG MONITORING, ED - Abnormal; Notable for the following components:   Glucose-Capillary 388 (*)    All other components within normal limits  CBG MONITORING, ED - Abnormal; Notable for the following components:   Glucose-Capillary 283 (*)    All other components within normal limits  TROPONIN I (HIGH SENSITIVITY)  TROPONIN I (HIGH SENSITIVITY)    EKG EKG Interpretation  Date/Time:  Wednesday March 01 2020 20:23:29 EST Ventricular Rate:  92 PR Interval:    QRS Duration: 186 QT Interval:  363 QTC Calculation: 449 R Axis:   10 Text Interpretation: Sinus rhythm  IVCD, consider atypical LBBB Baseline wander in lead(s) V5 T wave inversion Inferolateral leads more pronounced then prior Confirmed by Blanchie Dessert [phone]) on 03/01/2020 9:38:16 PM   Radiology DG Chest 2 View  Result Date: 03/01/2020 CLINICAL DATA:  Chest pain EXAM: CHEST - 2 VIEW COMPARISON:  07/21/2019 FINDINGS: The heart size and mediastinal contours are within normal limits. Both lungs are clear. The visualized skeletal structures are unremarkable. IMPRESSION: No active cardiopulmonary disease. Electronically Signed   By: Fidela Salisbury MD   On: 03/01/2020 21:04    Procedures Procedures (including critical care time)  Medications Ordered in ED Medications  traMADol (ULTRAM) tablet 100 mg (100 mg Oral Not Given 03/01/20 2207)  ondansetron [facility]) injection 4 mg (has no administration in time range)  sodium chloride 0.9 % bolus 1,000 mL (0 mLs Intravenous Stopped 03/01/20 2121)  sodium chloride 0.9 % bolus 1,000 mL (1,000 mLs Intravenous New Bag/Given 03/01/20 2350)    ED Course  I have reviewed the triage vital signs and the nursing notes.  Pertinent labs & imaging results that were available during my care of the patient were reviewed by me and considered in my medical decision making (see chart for details).    MDM Rules/Calculators/A&P                         44 year old diabetic patient presents with 2 weeks of nausea, intermittent vomiting and soft stools, fatigue.   Patient hypertensive on intake 130/92.  Borderline tachycardic heart rate 99.  CBG 460.  Physical exam concerning for generalized mild abdominal tenderness to palpation.  CBC, CMP, VBG, UA, troponin ordered.  EKG, chest x-ray ordered.  Chest x-ray with no active cardiopulmonary disease.  EKG with sinus rhythm, T wave inversions in the inferior lateral leads more pronounced than prior, no STEMI.  Laboratory studies pending CMP very Lipemic, needed to be sent to [facility] for evaluation.   IV  fluid bolus 1 L ordered.  CBC with anemia, hemoglobin 10.6.   I-STAT VBG with pH of 7.329 normal, acid-base deficit of 5.  Repeat CBG 388.  CMP with hyponatremia 129, AKI with creatinine of 2.05, anion gap of 17. Lab drawn prior to administration of IV fluids.   UA significant for greater than 500 glucose, rare bacteria.  Troponin negative, 3.   Repeat CBG 283.  Will administer second 1L of IV fluid resuscitation.  At time of my reevaluation of the patient she is feeling much better after administration of IV fluids.  She states she still feels mildly nauseous, but improved since fluid administration. Antiemetic offered.   Patient to be reevaluated after completion of second IV fluid bolus.  If she continues to feel better, and her vital signs remain normal, and her CBG improved she can likely go home this evening.  Recommend very close follow-up with her primary care doctor and endocrinologist.  She should discuss with her primary care doctor that her blood sample was very lipemic.  Additionally recommend cardiology follow-up for abnormal  EKG.  Extensive discussion with the patient to provide education regarding role and importance of CBG monitoring at home, as well as proper adherence to diabetic medications in the context of diabetes mellitus type 2.  Patient voiced understanding of this discussion and states she will do better moving forward.  Case discussed with attending physician Dr. Maryan Rued.  Given reassuring vital signs, benign physical exam, improving CBG after fluid resuscitation, I do not feel any further work-up is necessary emergency department at this time.  Zalaya voiced understanding of her medical evaluation and treatment plan.  Each of her questions were answered to her expressed satisfaction.  Strict return precautions were given.   Patient signed out to oncoming provider Dr. Randal Buba, who is agreeable to discharging this patient. I appreciate her collaboration in  the care of this patient.   Final Clinical Impression(s) / ED Diagnoses Final diagnoses:  Hyperglycemia  Acute renal failure, unspecified acute renal failure type [facility])    Rx / DC Orders ED Discharge Orders    None       Emeline Darling, PA-C 03/02/20 0045    Sonni Barse, Gypsy Balsam, PA-C 03/02/20 0046    Blanchie Dessert, MD 03/02/20 1818

## 2020-03-01 NOTE — ED Notes (Signed)
Pt took night time home medication.

## 2020-03-01 NOTE — ED Notes (Signed)
Patient transported to X-ray 

## 2020-03-02 DIAGNOSIS — E1165 Type 2 diabetes mellitus with hyperglycemia: Secondary | ICD-10-CM | POA: Diagnosis not present

## 2020-03-02 LAB — CBG MONITORING, ED: Glucose-Capillary: 283 mg/dL — ABNORMAL HIGH (ref 70–99)

## 2020-03-02 MED ORDER — ONDANSETRON HCL 4 MG/2ML IJ SOLN
4.0000 mg | Freq: Once | INTRAMUSCULAR | Status: AC
  Administered 2020-03-02: 4 mg via INTRAVENOUS
  Filled 2020-03-02: qty 2

## 2020-03-02 NOTE — ED Notes (Signed)
Discharge instructions discussed with patient. Verbalized understanding of follow up care and checking her blood sugar at home. Departs ED at this time in stable condition.

## 2020-03-02 NOTE — Discharge Instructions (Addendum)
Recheck of kidney function in 5 days.  Check sugars daily at home

## 2020-03-27 ENCOUNTER — Other Ambulatory Visit: Payer: Self-pay | Admitting: Cardiology

## 2020-03-27 DIAGNOSIS — I10 Essential (primary) hypertension: Secondary | ICD-10-CM

## 2020-05-04 ENCOUNTER — Other Ambulatory Visit: Payer: Self-pay | Admitting: Cardiology

## 2020-05-04 DIAGNOSIS — I10 Essential (primary) hypertension: Secondary | ICD-10-CM

## 2020-07-10 ENCOUNTER — Observation Stay (HOSPITAL_COMMUNITY): Payer: Medicare Other

## 2020-07-10 ENCOUNTER — Ambulatory Visit: Payer: Medicare Other | Admitting: Cardiology

## 2020-07-10 ENCOUNTER — Emergency Department (HOSPITAL_COMMUNITY): Payer: Medicare Other

## 2020-07-10 ENCOUNTER — Encounter (HOSPITAL_COMMUNITY): Payer: Self-pay | Admitting: Emergency Medicine

## 2020-07-10 ENCOUNTER — Inpatient Hospital Stay (HOSPITAL_COMMUNITY)
Admission: EM | Admit: 2020-07-10 | Discharge: 2020-07-15 | DRG: 683 | Disposition: A | Discharge status: Home Health | Payer: Medicare Other | Attending: Internal Medicine | Admitting: Internal Medicine

## 2020-07-10 ENCOUNTER — Encounter: Payer: Self-pay | Admitting: Cardiology

## 2020-07-10 ENCOUNTER — Other Ambulatory Visit: Payer: Self-pay

## 2020-07-10 VITALS — BP 141/91 | HR 103 | Temp 98.0°F | Resp 16 | Ht 68.0 in | Wt 187.0 lb

## 2020-07-10 DIAGNOSIS — E781 Pure hyperglyceridemia: Secondary | ICD-10-CM | POA: Diagnosis present

## 2020-07-10 DIAGNOSIS — R600 Localized edema: Secondary | ICD-10-CM | POA: Diagnosis present

## 2020-07-10 DIAGNOSIS — N179 Acute kidney failure, unspecified: Principal | ICD-10-CM | POA: Diagnosis present

## 2020-07-10 DIAGNOSIS — R0781 Pleurodynia: Secondary | ICD-10-CM | POA: Diagnosis present

## 2020-07-10 DIAGNOSIS — K5900 Constipation, unspecified: Secondary | ICD-10-CM

## 2020-07-10 DIAGNOSIS — Z8701 Personal history of pneumonia (recurrent): Secondary | ICD-10-CM

## 2020-07-10 DIAGNOSIS — E1159 Type 2 diabetes mellitus with other circulatory complications: Secondary | ICD-10-CM | POA: Diagnosis present

## 2020-07-10 DIAGNOSIS — Z79890 Hormone replacement therapy (postmenopausal): Secondary | ICD-10-CM

## 2020-07-10 DIAGNOSIS — G43909 Migraine, unspecified, not intractable, without status migrainosus: Secondary | ICD-10-CM | POA: Diagnosis present

## 2020-07-10 DIAGNOSIS — F419 Anxiety disorder, unspecified: Secondary | ICD-10-CM | POA: Diagnosis present

## 2020-07-10 DIAGNOSIS — Z794 Long term (current) use of insulin: Secondary | ICD-10-CM

## 2020-07-10 DIAGNOSIS — E739 Lactose intolerance, unspecified: Secondary | ICD-10-CM | POA: Diagnosis present

## 2020-07-10 DIAGNOSIS — Z91018 Allergy to other foods: Secondary | ICD-10-CM

## 2020-07-10 DIAGNOSIS — T39011S Poisoning by aspirin, accidental (unintentional), sequela: Secondary | ICD-10-CM

## 2020-07-10 DIAGNOSIS — M545 Low back pain, unspecified: Secondary | ICD-10-CM | POA: Diagnosis present

## 2020-07-10 DIAGNOSIS — Z79899 Other long term (current) drug therapy: Secondary | ICD-10-CM

## 2020-07-10 DIAGNOSIS — N183 Chronic kidney disease, stage 3 unspecified: Secondary | ICD-10-CM | POA: Diagnosis not present

## 2020-07-10 DIAGNOSIS — E8770 Fluid overload, unspecified: Secondary | ICD-10-CM | POA: Diagnosis present

## 2020-07-10 DIAGNOSIS — Z881 Allergy status to other antibiotic agents status: Secondary | ICD-10-CM

## 2020-07-10 DIAGNOSIS — I10 Essential (primary) hypertension: Secondary | ICD-10-CM

## 2020-07-10 DIAGNOSIS — Z86711 Personal history of pulmonary embolism: Secondary | ICD-10-CM

## 2020-07-10 DIAGNOSIS — Z90710 Acquired absence of both cervix and uterus: Secondary | ICD-10-CM

## 2020-07-10 DIAGNOSIS — E031 Congenital hypothyroidism without goiter: Secondary | ICD-10-CM | POA: Diagnosis present

## 2020-07-10 DIAGNOSIS — Z888 Allergy status to other drugs, medicaments and biological substances status: Secondary | ICD-10-CM

## 2020-07-10 DIAGNOSIS — Z20822 Contact with and (suspected) exposure to covid-19: Secondary | ICD-10-CM | POA: Diagnosis present

## 2020-07-10 DIAGNOSIS — R0602 Shortness of breath: Secondary | ICD-10-CM | POA: Diagnosis not present

## 2020-07-10 DIAGNOSIS — Z9621 Cochlear implant status: Secondary | ICD-10-CM | POA: Diagnosis present

## 2020-07-10 DIAGNOSIS — F32A Depression, unspecified: Secondary | ICD-10-CM | POA: Diagnosis present

## 2020-07-10 DIAGNOSIS — Z83438 Family history of other disorder of lipoprotein metabolism and other lipidemia: Secondary | ICD-10-CM

## 2020-07-10 DIAGNOSIS — H81399 Other peripheral vertigo, unspecified ear: Secondary | ICD-10-CM | POA: Diagnosis present

## 2020-07-10 DIAGNOSIS — G894 Chronic pain syndrome: Secondary | ICD-10-CM | POA: Diagnosis present

## 2020-07-10 DIAGNOSIS — Z87442 Personal history of urinary calculi: Secondary | ICD-10-CM

## 2020-07-10 DIAGNOSIS — F329 Major depressive disorder, single episode, unspecified: Secondary | ICD-10-CM | POA: Diagnosis present

## 2020-07-10 DIAGNOSIS — F509 Eating disorder, unspecified: Secondary | ICD-10-CM | POA: Diagnosis present

## 2020-07-10 DIAGNOSIS — E875 Hyperkalemia: Secondary | ICD-10-CM | POA: Diagnosis present

## 2020-07-10 DIAGNOSIS — F429 Obsessive-compulsive disorder, unspecified: Secondary | ICD-10-CM | POA: Diagnosis present

## 2020-07-10 DIAGNOSIS — H903 Sensorineural hearing loss, bilateral: Secondary | ICD-10-CM | POA: Diagnosis present

## 2020-07-10 DIAGNOSIS — Z91041 Radiographic dye allergy status: Secondary | ICD-10-CM

## 2020-07-10 DIAGNOSIS — I152 Hypertension secondary to endocrine disorders: Secondary | ICD-10-CM | POA: Diagnosis present

## 2020-07-10 DIAGNOSIS — M19042 Primary osteoarthritis, left hand: Secondary | ICD-10-CM | POA: Diagnosis present

## 2020-07-10 DIAGNOSIS — E1169 Type 2 diabetes mellitus with other specified complication: Secondary | ICD-10-CM | POA: Diagnosis present

## 2020-07-10 DIAGNOSIS — R112 Nausea with vomiting, unspecified: Secondary | ICD-10-CM

## 2020-07-10 DIAGNOSIS — M19041 Primary osteoarthritis, right hand: Secondary | ICD-10-CM | POA: Diagnosis present

## 2020-07-10 DIAGNOSIS — Z886 Allergy status to analgesic agent status: Secondary | ICD-10-CM

## 2020-07-10 DIAGNOSIS — F603 Borderline personality disorder: Secondary | ICD-10-CM | POA: Diagnosis present

## 2020-07-10 DIAGNOSIS — E1122 Type 2 diabetes mellitus with diabetic chronic kidney disease: Secondary | ICD-10-CM | POA: Diagnosis present

## 2020-07-10 DIAGNOSIS — I129 Hypertensive chronic kidney disease with stage 1 through stage 4 chronic kidney disease, or unspecified chronic kidney disease: Secondary | ICD-10-CM | POA: Diagnosis present

## 2020-07-10 DIAGNOSIS — K219 Gastro-esophageal reflux disease without esophagitis: Secondary | ICD-10-CM | POA: Diagnosis present

## 2020-07-10 DIAGNOSIS — Z882 Allergy status to sulfonamides status: Secondary | ICD-10-CM

## 2020-07-10 DIAGNOSIS — Z9109 Other allergy status, other than to drugs and biological substances: Secondary | ICD-10-CM

## 2020-07-10 DIAGNOSIS — Z7982 Long term (current) use of aspirin: Secondary | ICD-10-CM

## 2020-07-10 DIAGNOSIS — M797 Fibromyalgia: Secondary | ICD-10-CM | POA: Diagnosis present

## 2020-07-10 DIAGNOSIS — Z91013 Allergy to seafood: Secondary | ICD-10-CM

## 2020-07-10 DIAGNOSIS — R071 Chest pain on breathing: Secondary | ICD-10-CM

## 2020-07-10 DIAGNOSIS — E785 Hyperlipidemia, unspecified: Secondary | ICD-10-CM | POA: Diagnosis present

## 2020-07-10 DIAGNOSIS — E119 Type 2 diabetes mellitus without complications: Secondary | ICD-10-CM

## 2020-07-10 DIAGNOSIS — E1165 Type 2 diabetes mellitus with hyperglycemia: Secondary | ICD-10-CM | POA: Diagnosis present

## 2020-07-10 DIAGNOSIS — E872 Acidosis: Secondary | ICD-10-CM | POA: Diagnosis present

## 2020-07-10 DIAGNOSIS — N1832 Chronic kidney disease, stage 3b: Secondary | ICD-10-CM | POA: Diagnosis present

## 2020-07-10 DIAGNOSIS — N189 Chronic kidney disease, unspecified: Secondary | ICD-10-CM

## 2020-07-10 LAB — CBC
HCT: 33 % — ABNORMAL LOW (ref 36.0–46.0)
Hemoglobin: 11 g/dL — ABNORMAL LOW (ref 12.0–15.0)
MCH: 30.1 pg (ref 26.0–34.0)
MCHC: 33.3 g/dL (ref 30.0–36.0)
MCV: 90.2 fL (ref 80.0–100.0)
Platelets: 247 10*3/uL (ref 150–400)
RBC: 3.66 MIL/uL — ABNORMAL LOW (ref 3.87–5.11)
RDW: 14 % (ref 11.5–15.5)
WBC: 8.9 10*3/uL (ref 4.0–10.5)
nRBC: 0 % (ref 0.0–0.2)

## 2020-07-10 LAB — HEPATIC FUNCTION PANEL
ALT: 20 U/L (ref 0–44)
AST: 16 U/L (ref 15–41)
Albumin: 4 g/dL (ref 3.5–5.0)
Alkaline Phosphatase: 67 U/L (ref 38–126)
Bilirubin, Direct: <0.1 mg/dL (ref 0.0–0.2)
Total Bilirubin: 0.6 mg/dL (ref 0.3–1.2)
Total Protein: 6.9 g/dL (ref 6.5–8.1)

## 2020-07-10 LAB — RESP PANEL BY RT-PCR (FLU A&B, COVID) ARPGX2
Influenza A by PCR: NEGATIVE
Influenza B by PCR: NEGATIVE
SARS Coronavirus 2 by RT PCR: NEGATIVE

## 2020-07-10 LAB — URINALYSIS, ROUTINE W REFLEX MICROSCOPIC
Bilirubin Urine: NEGATIVE
Glucose, UA: 150 mg/dL — AB
Hgb urine dipstick: NEGATIVE
Ketones, ur: NEGATIVE mg/dL
Leukocytes,Ua: NEGATIVE
Nitrite: NEGATIVE
Protein, ur: 30 mg/dL — AB
Specific Gravity, Urine: 1.009 (ref 1.005–1.030)
pH: 5 (ref 5.0–8.0)

## 2020-07-10 LAB — BASIC METABOLIC PANEL
Anion gap: 11 (ref 5–15)
BUN: 53 mg/dL — ABNORMAL HIGH (ref 6–20)
CO2: 20 mmol/L — ABNORMAL LOW (ref 22–32)
Calcium: 10.6 mg/dL — ABNORMAL HIGH (ref 8.9–10.3)
Chloride: 104 mmol/L (ref 98–111)
Creatinine, Ser: 2.53 mg/dL — ABNORMAL HIGH (ref 0.44–1.00)
GFR, Estimated: 24 mL/min — ABNORMAL LOW (ref >60)
Glucose, Bld: 204 mg/dL — ABNORMAL HIGH (ref 70–99)
Potassium: 5.3 mmol/L — ABNORMAL HIGH (ref 3.5–5.1)
Sodium: 135 mmol/L (ref 135–145)

## 2020-07-10 LAB — CREATININE, URINE, RANDOM: Creatinine, Urine: 25.89 mg/dL

## 2020-07-10 LAB — SODIUM, URINE, RANDOM: Sodium, Ur: 96 mmol/L

## 2020-07-10 LAB — BRAIN NATRIURETIC PEPTIDE: B Natriuretic Peptide: 49.2 pg/mL (ref 0.0–100.0)

## 2020-07-10 LAB — D-DIMER, QUANTITATIVE: D-Dimer, Quant: <0.27 ug/mL-FEU (ref 0.00–0.50)

## 2020-07-10 LAB — TROPONIN I (HIGH SENSITIVITY)
Troponin I (High Sensitivity): 3 ng/L (ref <18)
Troponin I (High Sensitivity): 3 ng/L (ref <18)

## 2020-07-10 LAB — I-STAT BETA HCG BLOOD, ED (MC, WL, AP ONLY): I-stat hCG, quantitative: <5 m[IU]/mL (ref <5)

## 2020-07-10 MED ORDER — ONDANSETRON HCL 4 MG PO TABS: 4.0000 mg | ORAL_TABLET | Freq: Four times a day (QID) | ORAL | Status: DC | PRN

## 2020-07-10 MED ORDER — ACETAMINOPHEN 650 MG RE SUPP: 650.0000 mg | Freq: Four times a day (QID) | RECTAL | Status: DC | PRN

## 2020-07-10 MED ORDER — ONDANSETRON HCL 4 MG/2ML IJ SOLN
4.0000 mg | Freq: Four times a day (QID) | INTRAMUSCULAR | Status: DC | PRN
  Administered 2020-07-11 – 2020-07-12 (×2): 4 mg via INTRAVENOUS
  Filled 2020-07-10 (×2): qty 2

## 2020-07-10 MED ORDER — CYCLOBENZAPRINE HCL 10 MG PO TABS
5.0000 mg | ORAL_TABLET | Freq: Once | ORAL | Status: AC
  Administered 2020-07-10: 5 mg via ORAL
  Filled 2020-07-10: qty 1

## 2020-07-10 MED ORDER — SODIUM CHLORIDE 0.9 % IV BOLUS
500.0000 mL | Freq: Once | INTRAVENOUS | Status: AC
  Administered 2020-07-10: 500 mL via INTRAVENOUS

## 2020-07-10 MED ORDER — ACETAMINOPHEN 325 MG PO TABS
650.0000 mg | ORAL_TABLET | Freq: Four times a day (QID) | ORAL | Status: DC | PRN
  Administered 2020-07-12 (×2): 650 mg via ORAL
  Filled 2020-07-10 (×2): qty 2

## 2020-07-10 MED ORDER — ACETAMINOPHEN 325 MG PO TABS
650.0000 mg | ORAL_TABLET | Freq: Once | ORAL | Status: AC
  Administered 2020-07-10: 650 mg via ORAL
  Filled 2020-07-10: qty 2

## 2020-07-10 MED ORDER — SODIUM ZIRCONIUM CYCLOSILICATE 5 G PO PACK
5.0000 g | PACK | Freq: Once | ORAL | Status: DC
  Filled 2020-07-10: qty 1

## 2020-07-10 MED ORDER — TRAMADOL HCL 50 MG PO TABS
50.0000 mg | ORAL_TABLET | Freq: Once | ORAL | Status: AC
  Administered 2020-07-10: 50 mg via ORAL
  Filled 2020-07-10: qty 1

## 2020-07-10 MED ORDER — MECLIZINE HCL 25 MG PO TABS
12.5000 mg | ORAL_TABLET | Freq: Once | ORAL | Status: AC
  Administered 2020-07-10: 12.5 mg via ORAL
  Filled 2020-07-10: qty 1

## 2020-07-10 MED ORDER — ALBUTEROL SULFATE HFA 108 (90 BASE) MCG/ACT IN AERS: 2.0000 | INHALATION_SPRAY | Freq: Once | RESPIRATORY_TRACT | Status: DC

## 2020-07-10 MED ORDER — INSULIN ASPART 100 UNIT/ML ~~LOC~~ SOLN
0.0000 [IU] | Freq: Three times a day (TID) | SUBCUTANEOUS | Status: DC
  Administered 2020-07-11: 1 [IU] via SUBCUTANEOUS

## 2020-07-10 MED ORDER — CARVEDILOL 12.5 MG PO TABS
12.5000 mg | ORAL_TABLET | Freq: Two times a day (BID) | ORAL | Status: DC
Start: 2020-07-11 — End: 2020-07-13
  Administered 2020-07-11 – 2020-07-13 (×5): 12.5 mg via ORAL
  Filled 2020-07-10 (×5): qty 1

## 2020-07-10 MED ORDER — ALBUTEROL SULFATE HFA 108 (90 BASE) MCG/ACT IN AERS
2.0000 | INHALATION_SPRAY | Freq: Once | RESPIRATORY_TRACT | Status: AC
  Administered 2020-07-10: 2 via RESPIRATORY_TRACT
  Filled 2020-07-10: qty 6.7

## 2020-07-10 MED ORDER — GEMFIBROZIL 600 MG PO TABS
300.0000 mg | ORAL_TABLET | Freq: Every day | ORAL | Status: DC
  Administered 2020-07-11: 300 mg via ORAL
  Filled 2020-07-10: qty 0.5

## 2020-07-10 MED ORDER — AMLODIPINE BESYLATE 10 MG PO TABS
10.0000 mg | ORAL_TABLET | Freq: Every day | ORAL | Status: DC
  Administered 2020-07-11 – 2020-07-13 (×3): 10 mg via ORAL
  Filled 2020-07-10 (×3): qty 1

## 2020-07-10 MED ORDER — LEVOTHYROXINE SODIUM 75 MCG PO TABS
175.0000 ug | ORAL_TABLET | Freq: Every day | ORAL | Status: DC
  Administered 2020-07-11 – 2020-07-14 (×4): 175 ug via ORAL
  Filled 2020-07-10 (×7): qty 1

## 2020-07-10 MED ORDER — INSULIN GLARGINE 100 UNIT/ML ~~LOC~~ SOLN
30.0000 [IU] | Freq: Every day | SUBCUTANEOUS | Status: DC
  Administered 2020-07-11 – 2020-07-14 (×5): 30 [IU] via SUBCUTANEOUS
  Filled 2020-07-10 (×6): qty 0.3

## 2020-07-10 MED ORDER — ENOXAPARIN SODIUM 30 MG/0.3ML ~~LOC~~ SOLN
30.0000 mg | SUBCUTANEOUS | Status: DC
  Administered 2020-07-11 (×2): 30 mg via SUBCUTANEOUS
  Filled 2020-07-10 (×5): qty 0.3

## 2020-07-10 MED ORDER — SERTRALINE HCL 50 MG PO TABS
75.0000 mg | ORAL_TABLET | Freq: Every day | ORAL | Status: DC
  Administered 2020-07-11 – 2020-07-15 (×5): 75 mg via ORAL
  Filled 2020-07-10 (×2): qty 2
  Filled 2020-07-10: qty 1
  Filled 2020-07-10 (×3): qty 2

## 2020-07-10 MED ORDER — PREGABALIN 75 MG PO CAPS
150.0000 mg | ORAL_CAPSULE | Freq: Every day | ORAL | Status: DC
  Administered 2020-07-10 – 2020-07-15 (×6): 150 mg via ORAL
  Filled 2020-07-10 (×2): qty 2
  Filled 2020-07-10: qty 1
  Filled 2020-07-10 (×3): qty 2

## 2020-07-10 MED ORDER — GEMFIBROZIL 600 MG PO TABS
600.0000 mg | ORAL_TABLET | Freq: Every day | ORAL | Status: DC
  Administered 2020-07-11: 600 mg via ORAL
  Filled 2020-07-10 (×2): qty 1

## 2020-07-10 MED ORDER — ONDANSETRON HCL 4 MG/2ML IJ SOLN
4.0000 mg | Freq: Once | INTRAMUSCULAR | Status: AC
  Administered 2020-07-10: 4 mg via INTRAVENOUS
  Filled 2020-07-10: qty 2

## 2020-07-10 MED ORDER — SODIUM CHLORIDE 0.9 % IV SOLN: INTRAVENOUS | Status: DC

## 2020-07-10 NOTE — ED Notes (Addendum)
CT brought pt back, per transport, pt unable to tolerate scan and started saying she couldn't breathe. Pt being wheeled down the hall moaning and breathing. Provider notified.

## 2020-07-10 NOTE — Progress Notes (Signed)
Primary Physician/Referring:  Bernerd Limbo, MD  Patient ID: Brandi Love, female    DOB: 07-May-1975, 45 y.o.   MRN: [id number]  Chief Complaint  Patient presents with  . Shortness of Breath  . Hypertension  . Follow-up   HPI:    Brandi Love  is a 45 y.o. eating disorder, depression with psychosis, borderline personality disorder, OCD, hypothyroidism, deafness with cochlear implants, and chronic kidney disease stage III, and is attributed to NSAID use and also patient once try to commit suicide by consuming excessive amounts of aspirin in the remote past, DM.  She is being followed at [facility] for familial hypertriglyceridemia.   She underwent right salpingo-oophorectomy on 09/02/2019 without periprocedural cardiac complications and found to have benign tumor. She has had multiple hospital admissions for eating disorder and also for depression.   She was sent over today as a stat appointment from her PCP as patient has been having marked dyspnea on exertion and also dyspnea at rest for the past 1 week.  Patient states that she is having hard time breathing and has noticed saturations falling down to 90%.  She also complains of inability to lay down flat as she gets markedly dyspneic.  Over the past 1 week she has also developed sudden onset of marked dizziness, she has been extremely unsteady on her gait and has started using a cane to support herself.  She has not had any syncope.  She has noticed mild leg swelling but no other specific symptoms.  She also complains of chest tightness which she states is in the left side of the chest comes on and off.  States taking deep breath hurts.  Past Medical History:  Diagnosis Date  . Anemia   . Anorexia   . Anxiety   . Arthritis    "hands" (09/18/2016)  . Chronic kidney disease   . Chronic lower back pain   . Chronic renal insufficiency   . Deaf   . Deafness   . Depression   . Diabetes ([place])    "borderline"  . Eating  disorder   . Endometriosis   . GERD (gastroesophageal reflux disease)   . History of kidney stones   . Hyperlipidemia   . 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 MDD (major depressive disorder)   . Migraine    "frequency varies" (09/18/2016)  . OCD (obsessive compulsive disorder)   . Pneumonia 2011  . PONV (postoperative nausea and vomiting)   . Pulmonary embolism ([place]) 10/2001  . Seizures ([place]) 09/2001 X 1   "day after my hysterectomy"  . Seizures ([place])   . Self mutilating behavior   . Tachycardia    Past Surgical History:  Procedure Laterality Date  . ABDOMINAL HYSTERECTOMY  09/2001   'except for right ovary'  . BREAST LUMPECTOMY Left 2000   benign  . BREAST LUMPECTOMY Right 2002   benign  . COCHLEAR IMPLANT Bilateral 2004-2010   "right-left"  . COLONOSCOPY  06/2015    Dr Rolm Bookbinder at [place] for rectal bleeding: internal hemorrhoids, small anal fissure.    . ESOPHAGOGASTRODUODENOSCOPY N/A 10/07/2016   Procedure: ESOPHAGOGASTRODUODENOSCOPY (EGD);  Surgeon: Ladene Artist, MD;  Location: [facility] ENDOSCOPY;  Service: Endoscopy;  Laterality: N/A;  . EYE MUSCLE SURGERY Left 1980   "lazy eye"  . EYE MUSCLE SURGERY Right 1990   "lazy eye"  . LAPAROSCOPIC ABDOMINAL EXPLORATION  2001 and 2002   for endometriosis  . OOPHORECTOMY Right  08/2019   Family History  Problem Relation Age of Onset  . Cancer Father   . Hyperlipidemia Father   . Osteoarthritis Mother   . Hyperlipidemia Mother     Social History   Tobacco Use  . Smoking status: Never Smoker  . Smokeless tobacco: Never Used  Substance Use Topics  . Alcohol use: Never   Marital Status: Single  ROS  Review of Systems  Cardiovascular: Positive for chest pain, dyspnea on exertion and leg swelling.  Gastrointestinal: Negative for melena.  Neurological: Positive for disturbances in coordination, dizziness and loss of balance.   Objective  Blood pressure (!) 141/91, pulse (!) 103, temperature 98 F  (36.7 C), resp. rate 16, height '5\' 8"'  (1.727 m), weight 187 lb (84.8 kg), SpO2 97 %.  Vitals with BMI 07/10/2020 03/02/2020 03/02/2020  Height '5\' 8"'  - -  Weight 187 lbs - -  BMI 41.93 - -  Systolic 790 240 973  Diastolic 91 74 81  Pulse 532 88 91  Some encounter information is confidential and restricted. Go to Review Flowsheets activity to see all data.     Physical Exam Cardiovascular:     Rate and Rhythm: Regular rhythm. Tachycardia present.     Pulses: Normal pulses and intact distal pulses.     Heart sounds: No murmur heard. Gallop present. S4 sounds present.      Comments: No leg edema, no JVD. Pulmonary:     Effort: Pulmonary effort is normal.     Breath sounds: Normal breath sounds.  Abdominal:     General: Bowel sounds are normal.     Palpations: Abdomen is soft.    Laboratory examination:   Recent Labs    08/12/19 2055 08/12/19 2056 11/06/19 1841 11/16/19 1020 03/01/20 2012 03/01/20 2039  NA 124*   < > 133* 134* 129* 131*  K 4.7   < > 4.3 5.1 4.2 4.3  CL 93*  --  100 99 96*  --   CO2 17*  --  20* 20* 16*  --   GLUCOSE 592*  --  74 159* 465*  --   BUN 41*  --  31* 33* 37*  --   CREATININE 1.81*  --  1.67* 1.64* 2.05*  --   CALCIUM 9.5  --  9.9 9.8 9.5  --   GFRNONAA 34*  --  38* 38* 31*  --   GFRAA 40*  --  44* 45*  --   --    < > = values in this interval not displayed.   CrCl cannot be calculated (Patient's most recent lab result is older than the maximum 21 days allowed.).  CMP Latest Ref Rng & Units 03/01/2020 03/01/2020 11/16/2019  Glucose 70 - 99 mg/dL - 465(H) 159(H)  BUN 6 - 20 mg/dL - 37(H) 33(H)  Creatinine 0.44 - 1.00 mg/dL - 2.05(H) 1.64(H)  Sodium 135 - 145 mmol/L 131(L) 129(L) 134(L)  Potassium 3.5 - 5.1 mmol/L 4.3 4.2 5.1  Chloride 98 - 111 mmol/L - 96(L) 99  CO2 22 - 32 mmol/L - 16(L) 20(L)  Calcium 8.9 - 10.3 mg/dL - 9.5 9.8  Total Protein 6.5 - 8.1 g/dL - 6.8 7.5  Total Bilirubin 0.3 - 1.2 mg/dL - 0.6 0.5  Alkaline Phos 38 - 126 U/L  - 77 88  AST 15 - 41 U/L - 13(L) 14(L)  ALT 0 - 44 U/L - 17 12   CBC Latest Ref Rng & Units 03/01/2020 03/01/2020 11/16/2019  WBC  4.0 - 10.5 K/uL - 4.9 9.1  Hemoglobin 12.0 - 15.0 g/dL 15.3(H) 10.8(L) 13.5  Hematocrit 36.0 - 46.0 % 45.0 29.5(L) 38.7  Platelets 150 - 400 K/uL - 208 307    Lipid Panel No results for input(s): CHOL, TRIG, LDLCALC, VLDL, HDL, CHOLHDL, LDLDIRECT in the last 8760 hours.  HEMOGLOBIN A1C Lab Results  Component Value Date   HGBA1C 5.5 10/10/2016   MPG 111 10/10/2016   TSH No results for input(s): TSH in the last 8760 hours.  External labs:   Chest x-ray PA and lateral view 07/04/2020: Normal chest x-ray.  D-dimer 07/04/2020: 290 (<500).  Labs 07/07/2020:  BNP normal at 22.4.  Serum glucose 191 mg, BUN 63, creatinine 2.80, EGFR 21 mL, potassium 5.5.  Hb 10.8/HCT 31.0, platelets 249.  Iron studies: TIBC 474, elevated.  Iron saturation markedly reduced at 11.  Labs 11/08/2019:  Total cholesterol 477, triglycerides 4259, HDL 31, LDL not calculated.  Labs 10/28/2019: TSH minimally elevated at 4.680.  Free T4 normal.  Medications and allergies   Allergies  Allergen Reactions  . Fish Oil Anaphylaxis    Facial edema, itching  . Iodinated Diagnostic Agents Anaphylaxis    'cant breathe'  . Melatonin Itching  . Other Itching, Swelling, Rash and Anaphylaxis    Unable to take due to renal insufficency Unable to ttake due to renal insufficency Eyes swell 'cant breathe' 'cant breathe' 'cant breathe' 'cant breathe' Other reaction(s): Other (See Comments) Patient stated she can not take because she has renal insufficiency Unable to ttake due to renal insufficency 'cant breathe'  . Phenylephrine-Guaifenesin Shortness Of Breath and Swelling    Throat swelling  . Propoxyphene Anaphylaxis  . Abilify [Aripiprazole] Nausea And Vomiting  . Dhea [Nutritional Supplements] Nausea And Vomiting and Swelling  . Dihydroergotamine Nausea And Vomiting  .  Divalproex Sodium Nausea And Vomiting and Other (See Comments)    Weight gain  . Doxycycline Nausea And Vomiting  . Erythromycin Nausea And Vomiting  . Indomethacin Nausea And Vomiting  . Lactose Intolerance (Gi) Nausea And Vomiting  . Melatonin Itching  . Risperidone And Related Other (See Comments)    Out of body feeling  . Shellfish Allergy Itching and Swelling    Eyes swell  . Statins Other (See Comments)    Cannot tolerate  . Tape Other (See Comments)    Redness/ please use paper tape  . Dihydroergotamine Nausea And Vomiting  . Lactose Intolerance (Gi) Nausea And Vomiting  . Nsaids Other (See Comments)    Unable to ttake due to renal insufficency  . Sulfa Antibiotics Itching    Redness/ please use paper tape Redness, can use paper tape  . Tape Other (See Comments)    Redness, can use paper tape     Outpatient Medications Prior to Visit  Medication Sig Dispense Refill  . acetaminophen (TYLENOL) 500 MG tablet Take 1,000-3,000 mg by mouth See admin instructions. Take 2 tablets in the morning, at lunch and take 6 tablets at bedtime    . allopurinol (ZYLOPRIM) 300 MG tablet Take by mouth.    [place] Kitchen amLODipine (NORVASC) 10 MG tablet Take 1 tablet by mouth once daily 30 tablet 9  . aspirin EC 81 MG tablet Take 81 mg by mouth daily.    . carvedilol (COREG) 12.5 MG tablet Take 12.5 mg by mouth 2 (two) times daily.    . cyclobenzaprine (FLEXERIL) 5 MG tablet Take by mouth 3 (three) times daily as needed.     . D3-50  1.25 MG (50000 UT) capsule Take 50,000 Units by mouth once a week.    . dicyclomine (BENTYL) 20 MG tablet Take 1 tablet (20 mg total) by mouth 2 (two) times daily. 20 tablet 0  . docusate sodium (COLACE) 100 MG capsule Take 2 capsules at bedtime    . estradiol (CLIMARA - DOSED IN MG/24 HR) 0.05 mg/24hr patch Place 0.05 mg onto the skin once a week.    [place] Kitchen gemfibrozil (LOPID) 600 MG tablet 600 mg in the morning and 300 mg in the evening    . glimepiride (AMARYL) 4 MG tablet Take  4 mg by mouth daily.    . insulin aspart (NOVOLOG) 100 UNIT/ML injection Inject 45 Units into the skin once.    . INVOKANA 300 MG TABS tablet Take 300 mg by mouth every morning.    [place] Kitchen levothyroxine (SYNTHROID) 137 MCG tablet Take 150 mcg by mouth daily.     . magnesium oxide (MAG-OX) 400 (241.3 Mg) MG tablet Take by mouth 2 (two) times a day.    . meclizine (ANTIVERT) 25 MG tablet Take 25 mg by mouth 3 (three) times daily as needed.    . Melatonin 5 MG TABS Take 3 mg by mouth at bedtime.    . metoCLOPramide (REGLAN) 10 MG tablet Take 1 tablet (10 mg total) by mouth every 6 (six) hours as needed for nausea or vomiting. 12 tablet 0  . Multiple Vitamin (MULTIVITAMIN WITH MINERALS) TABS Take 1 tablet by mouth daily.    . pantoprazole (PROTONIX) 40 MG tablet Take 40 mg by mouth daily.    . pregabalin (LYRICA) 150 MG capsule Take 150 mg by mouth 3 (three) times daily.    . promethazine (PHENERGAN) 25 MG tablet Take 25 mg by mouth every 8 (eight) hours as needed.    . sertraline (ZOLOFT) 50 MG tablet Take 50 mg by mouth daily.    . traMADol (ULTRAM) 50 MG tablet Take 100 mg by mouth every 8 (eight) hours as needed.     [place] Kitchen atenolol (TENORMIN) 50 MG tablet Take 1 tablet (50 mg total) by mouth daily. 90 tablet 3  . lurasidone (LATUDA) 40 MG TABS tablet Take 40 mg by mouth daily with breakfast.    . magnesium oxide (MAG-OX) 400 MG tablet Take 1 tablet by mouth 2 (two) times daily.     No facility-administered medications prior to visit.   Radiology:   No results found.  Cardiac Studies:   Echocardiogram 09/30/2016: Normal LV size, normal LV systolic function, EF 83-41% without regional wall motion abnormality.  Stress test- 10/13/12 Negative for ischemia, there was no significant ST.   depression c.f. resting EKG. Walked for 7 minutes.   Peak HR- 174/min ( 93% MPHR), achieved 8.2 METS.  EKG  EKG 07/10/2020: Sinus tachycardia at rate of 101 bpm, left atrial abnormality, normal axis, incomplete right  bundle branch block, poor R wave progression, probably normal variant.  No evidence of ischemia, normal QT interval. No significant change from prior EKG.  EKG 09/17/2019: Sinus tachycardia at the rate of 105 bpm, normal axis, poor R wave progression, probably normal variant.  No evidence of ischemia.  No significant change from prior EKG.  Assessment     ICD-10-CM   1. SOB (shortness of breath)  R06.02 EKG 12-Lead  2. Bilateral leg edema  R60.0   3. Precordial pain  R07.2      Medications Discontinued During This Encounter  Medication Reason  . atenolol (TENORMIN) 50  MG tablet Error  . lurasidone (LATUDA) 40 MG TABS tablet Error  . magnesium oxide (MAG-OX) 400 MG tablet Error    No orders of the defined types were placed in this encounter.  Orders Placed This Encounter  Procedures  . EKG 12-Lead    Recommendations:   Brandi Love is a 45 y.o. eating disorder, depression with psychosis, borderline personality disorder, OCD, hypothyroidism, deafness with cochlear implants, and chronic kidney disease stage III, and is attributed to NSAID use and also patient once try to commit suicide by consuming excessive amounts of aspirin in the remote past, DM.  She is being [facility] at [facility] for familial hypertriglyceridemia.   She underwent right salpingo-oophorectomy on 09/02/2019 without periprocedural cardiac complications and found to have benign tumor.  She is seen on a stat basis due to chest pain, marked dyspnea and sent over by her PCP.  Patient has had negative D-dimer per PCP and a normal chest x-ray.  Her physical examination reveals patient with tachycardia, bilateral 1-2+ pitting edema and left calf tenderness is present.  Patient is also noticed decreased oxygen saturation to around 90% at home.  Patient is extremely unsteady on her gait, also has symptoms suggestive of BPV, in view of high suspicion for potential pulmonary embolism, as chest pain is suggestive of  musculoskeletal etiology, I have recommended that we send the patient to the emergency room for evaluation of her acute presentation.  She lives by herself and she is not safe to be home.  I will continue to follow her during her hospitalization if she needs them.  I reviewed external labs, her BMP was normal and D-dimer was normal and chest x-ray was normal.  Etiology for her symptomatology is complex.  She does have iron deficiency anemia.   Adrian Prows, MD, [facility] 07/10/2020, 10:55 AM Office: [phone]

## 2020-07-10 NOTE — ED Notes (Signed)
Per PA, ambulate pt w/ pulse oximeter, notified primary RN

## 2020-07-10 NOTE — ED Provider Notes (Signed)
[facility] EMERGENCY DEPARTMENT Provider Note   CSN: [id number] Arrival date & time: 07/10/20  1143     History Chief Complaint  Patient presents with  . Chest Pain    ASL lan gauge interpretor was used throughout this interpretor  Brandi Love is a 45 y.o. female.  HPI   Pt is a 45 y/o female with a h/o anemia, anorexia, anxiety, arthritis, CKD, deafness, depression, diabetes, eating disorder, GERD, HLD, hypothyroidism, migraine, OCD, PE, seizures, inappropriate tachycardia, who opresnts to the ED today for eval of shortness of breath that started about 2 weeks ago. States that in the last week SOB has been worse and in the last few days it has gotten even more severe. She reports orthopnea, BLE edema and chest discomfort. Reports chest pain at rest that is worse with exertion. Describes it as a squeezing pain on the left side of her chest that is worse with inspiration. Reports decreased appetite and PO intake. Reports a headache, dizziness and vision changes as well. States dizziness has been present for the last several days and is constant in nature. Describes this as near syncope but also states that the room feels like its spinning. She typically ambulates independently but has been using a cane for the last few days. States headache is not consistent with prior migraines and she typically does not have these symptoms with her migraines. Denies any cough, fevers, hemoptysis.   Denies any vomiting, diarrhea, abd pain, fevers. Denies recent surgeries, hospital admissions, periods of immobility or trauma. Reports h/o PE after a hysterectomy and is not currently anticoagulated. States she is on an estrogen patch.   Past Medical History:  Diagnosis Date  . Anemia   . Anorexia   . Anxiety   . Arthritis    "hands" (09/18/2016)  . Chronic kidney disease   . Chronic lower back pain   . Chronic renal insufficiency   . Deaf   . Deafness   . Depression   . Diabetes  ([place])    "borderline"  . Eating disorder   . Endometriosis   . GERD (gastroesophageal reflux disease)   . History of kidney stones   . Hyperlipidemia   . 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 MDD (major depressive disorder)   . Migraine    "frequency varies" (09/18/2016)  . OCD (obsessive compulsive disorder)   . Pneumonia 2011  . PONV (postoperative nausea and vomiting)   . Pulmonary embolism ([place]) 10/2001  . Seizures ([place]) 09/2001 X 1   "day after my hysterectomy"  . Seizures ([place])   . Self mutilating behavior   . Tachycardia     Patient Active Problem List   Diagnosis Date Noted  . Acute kidney injury superimposed on chronic kidney disease ([place]) 07/10/2020  . Hyperkalemia 07/10/2020  . Deaf 10/01/2018  . Hematochezia 10/01/2018  . Hypertension 10/01/2018  . Loss of appetite 10/01/2018  . Mental health problem 10/01/2018  . Seizures ([place]) 10/01/2018  . Abdominal pain 07/02/2018  . Gastroesophageal reflux disease without esophagitis 02/16/2018  . Primary insomnia 02/16/2018  . Prediabetes 01/28/2018  . Insulin dependent type 2 diabetes mellitus ([place]) 01/28/2018  . Syncope 08/26/2017  . Acute renal failure superimposed on stage 3 chronic kidney disease ([place]) 08/26/2017  . Orthostatic syncope   . Self mutilating behavior 08/08/2017  . Acne 06/11/2017  . Self-care deficit for bathing 06/11/2017  . Urinary tract infection with hematuria   . OCD (obsessive  compulsive disorder) 09/29/2016  . Anorexia nervosa with bulimia 09/29/2016  . Deafness congenital 09/29/2016  . Intractable nausea and vomiting 09/29/2016  . Bloody diarrhea 09/29/2016  . AKI (acute kidney injury) ([place]) 09/29/2016  . Severe dehydration 09/29/2016  . Severe protein-calorie malnutrition ([place]) 09/29/2016  . Congenital hypothyroidism 09/29/2016  . Hypertriglyceridemia 09/29/2016  . Anemia 09/29/2016  . Endometriosis 09/29/2016  . MDD (major depressive disorder) 09/29/2016  .  Hyperlipidemia 09/29/2016  . CKD (chronic kidney disease), stage III ([place]) 09/29/2016  . Acute kidney injury ([place]) 09/29/2016  . OAB (overactive bladder) 09/29/2016  . Sepsis ([place]) 09/18/2016  . Acute pyelonephritis 09/18/2016  . Urinary incontinence 08/09/2015  . MDD (major depressive disorder), recurrent, severe, with psychosis ([place]) 12/11/2014  . Severe recurrent major depressive disorder with psychotic symptoms ([employer]) 12/11/2014  . Migraine 07/02/2013  . Eating disorder, unspecified 11/23/2012  . Hypothyroidism 11/23/2012  . Hyperlipidemia associated with type 2 diabetes mellitus ([place]) 11/23/2012  . Sinus tachycardia 11/23/2012  . CKD (chronic kidney disease) stage 3, GFR 30-59 ml/min ([facility]) 11/18/2012  . Protein-calorie malnutrition, severe ([place]) 11/13/2012  . Abdominal pain, epigastric 11/12/2012  . Dehydration 11/12/2012  . Acute on chronic renal failure ([place]) 11/12/2012  . Leukocytosis, unspecified 11/12/2012  . Hyponatremia 11/12/2012  . Borderline personality disorder ([place]) 11/12/2012    Past Surgical History:  Procedure Laterality Date  . ABDOMINAL HYSTERECTOMY  09/2001   'except for right ovary'  . BREAST LUMPECTOMY Left 2000   benign  . BREAST LUMPECTOMY Right 2002   benign  . COCHLEAR IMPLANT Bilateral 2004-2010   "right-left"  . COLONOSCOPY  06/2015    Dr Rolm Bookbinder at [facility] for rectal bleeding: internal hemorrhoids, small anal fissure.    . ESOPHAGOGASTRODUODENOSCOPY N/A 10/07/2016   Procedure: ESOPHAGOGASTRODUODENOSCOPY (EGD);  Surgeon: Ladene Artist, MD;  Location[facility] ENDOSCOPY;  Service: Endoscopy;  Laterality: N/A;  . EYE MUSCLE SURGERY Left 1980   "lazy eye"  . EYE MUSCLE SURGERY Right 1990   "lazy eye"  . LAPAROSCOPIC ABDOMINAL EXPLORATION  2001 and 2002   for endometriosis  . OOPHORECTOMY Right 08/2019     OB History   No obstetric history on file.     Family History  Problem Relation Age of Onset  . Cancer Father   .  Hyperlipidemia Father   . Osteoarthritis Mother   . Hyperlipidemia Mother     Social History   Tobacco Use  . Smoking status: Never Smoker  . Smokeless tobacco: Never Used  Vaping Use  . Vaping Use: Never used  Substance Use Topics  . Alcohol use: Never  . Drug use: Never    Home Medications Prior to Admission medications   Medication Sig Start Date End Date Taking? Authorizing Provider  acetaminophen (TYLENOL) 500 MG tablet Take 1,000-3,000 mg by mouth See admin instructions. Take 2 tablets in the morning, at lunch and take 6 tablets at bedtime    [provider]  allopurinol (ZYLOPRIM) 300 MG tablet Take by mouth. 08/12/19   [provider]  amLODipine (NORVASC) 10 MG tablet Take 1 tablet by mouth once daily 05/04/20   Cantwell, Celeste C, PA-C  aspirin EC 81 MG tablet Take 81 mg by mouth daily.    [provider]  carvedilol (COREG) 12.5 MG tablet Take 12.5 mg by mouth 2 (two) times daily. 07/09/20   [provider]  cyclobenzaprine (FLEXERIL) 5 MG tablet Take by mouth 3 (three) times daily as needed.  06/30/18   [provider]  D3-50 1.25 MG (50000 UT) capsule Take 50,000 Units by mouth once a week. 08/08/19   [provider]  dicyclomine (BENTYL) 20 MG tablet Take 1 tablet (20 mg total) by mouth 2 (two) times daily. 11/16/19   Couture, Cortni S, PA-C  docusate sodium (COLACE) 100 MG capsule Take 2 capsules at bedtime 07/14/18   [provider]  estradiol (CLIMARA - DOSED IN MG/24 HR) 0.05 mg/24hr patch Place 0.05 mg onto the skin once a week. 03/24/20   [provider]  gemfibrozil (LOPID) 600 MG tablet 600 mg in the morning and 300 mg in the evening 09/09/17   [provider]  glimepiride (AMARYL) 4 MG tablet Take 4 mg by mouth daily. 06/15/19   [provider]  insulin aspart (NOVOLOG) 100 UNIT/ML injection Inject 45 Units into the skin once.    [provider]  INVOKANA 300 MG TABS  tablet Take 300 mg by mouth every morning. 06/23/20   [provider]  levothyroxine (SYNTHROID) 137 MCG tablet Take 150 mcg by mouth daily.  07/17/17   [provider]  magnesium oxide (MAG-OX) 400 (241.3 Mg) MG tablet Take by mouth 2 (two) times a day. 11/06/17   [provider]  meclizine (ANTIVERT) 25 MG tablet Take 25 mg by mouth 3 (three) times daily as needed. 06/03/19   [provider]  Melatonin 5 MG TABS Take 3 mg by mouth at bedtime.    [provider]  metoCLOPramide (REGLAN) 10 MG tablet Take 1 tablet (10 mg total) by mouth every 6 (six) hours as needed for nausea or vomiting. 11/07/19   Molpus, John, MD  Multiple Vitamin (MULTIVITAMIN WITH MINERALS) TABS Take 1 tablet by mouth daily.    [provider]  pantoprazole (PROTONIX) 40 MG tablet Take 40 mg by mouth daily.    [provider]  pregabalin (LYRICA) 150 MG capsule Take 150 mg by mouth 3 (three) times daily. 08/10/19   [provider]  promethazine (PHENERGAN) 25 MG tablet Take 25 mg by mouth every 8 (eight) hours as needed. 06/02/19   [provider]  sertraline (ZOLOFT) 50 MG tablet Take 50 mg by mouth daily. 07/09/20   [provider]  traMADol (ULTRAM) 50 MG tablet Take 100 mg by mouth every 8 (eight) hours as needed.  07/19/19   [provider]    Allergies    Fish oil, Iodinated diagnostic agents, Melatonin, Other, Phenylephrine-guaifenesin, Propoxyphene, Abilify [aripiprazole], Dhea [nutritional supplements], Dihydroergotamine, Divalproex sodium, Doxycycline, Erythromycin, Indomethacin, Lactose intolerance (gi), Melatonin, Risperidone and related, Shellfish allergy, Statins, Tape, Dihydroergotamine, Lactose intolerance (gi), Nsaids, Sulfa antibiotics, and Tape  Review of Systems   Review of Systems  Constitutional: Negative for fever.  HENT: Negative for ear pain and sore throat.   Eyes: Positive for visual disturbance. Negative for  pain.  Respiratory: Positive for shortness of breath. Negative for cough.   Cardiovascular: Positive for chest pain and leg swelling.  Gastrointestinal: Positive for nausea. Negative for abdominal pain, constipation, diarrhea and vomiting.  Genitourinary: Negative for dysuria and hematuria.  Musculoskeletal: Negative for back pain.  Skin: Negative for rash.  Neurological: Positive for dizziness and headaches. Negative for weakness and numbness.  All other systems reviewed and are negative.   Physical Exam Updated Vital Signs BP (!) 143/86   Pulse 92   Temp 98 F (36.7 C) (Oral)   Resp 16   LMP  (LMP Unknown)   SpO2 97%   Physical  Exam Vitals and nursing note reviewed.  Constitutional:      General: She is not in acute distress.    Appearance: She is well-developed.  HENT:     Head: Normocephalic and atraumatic.     Mouth/Throat:     Mouth: Mucous membranes are dry.  Eyes:     Extraocular Movements: Extraocular movements intact.     Conjunctiva/sclera: Conjunctivae normal.     Pupils: Pupils are equal, round, and reactive to light.     Comments: No obvious nystagmus though exam limited as pt is dizzy on exam and unable to fully participate.  Cardiovascular:     Rate and Rhythm: Normal rate and regular rhythm.     Heart sounds: Normal heart sounds. No murmur heard.   Pulmonary:     Effort: Pulmonary effort is normal. No respiratory distress.     Breath sounds: Normal breath sounds. No decreased breath sounds, wheezing, rhonchi or rales.  Chest:     Chest wall: Tenderness present.  Abdominal:     General: Bowel sounds are normal.     Palpations: Abdomen is soft.     Tenderness: There is no abdominal tenderness. There is no guarding or rebound.  Musculoskeletal:     Cervical back: Neck supple.     Right lower leg: Edema present.     Left lower leg: Tenderness present. Edema present.  Skin:    General: Skin is warm and dry.  Neurological:     Mental Status: She is  alert.     Comments: Mental Status:  Alert, thought content appropriate, able to give a coherent history. Speech fluent without evidence of aphasia. Able to follow 2 step commands without difficulty.  Cranial Nerves: II-XII intact Motor:  Normal tone. 5/5 strength of BUE and BLE major muscle groups including strong and equal grip strength and dorsiflexion/plantar flexion Sensory: light touch normal in all extremities. Cerebellar: normal finger-to-nose with bilateral upper extremities Gait: normal gait and balance.       ED Results / Procedures / Treatments   Labs (all labs ordered are listed, but only abnormal results are displayed) Labs Reviewed  BASIC METABOLIC PANEL - Abnormal; Notable for the following components:      Result Value   Potassium 5.3 (*)    CO2 20 (*)    Glucose, Bld 204 (*)    BUN 53 (*)    Creatinine, Ser 2.53 (*)    Calcium 10.6 (*)    GFR, Estimated 24 (*)    All other components within normal limits  CBC - Abnormal; Notable for the following components:   RBC 3.66 (*)    Hemoglobin 11.0 (*)    HCT 33.0 (*)    All other components within normal limits  RESP PANEL BY RT-PCR (FLU A&B, COVID) ARPGX2  BRAIN NATRIURETIC PEPTIDE  D-DIMER, QUANTITATIVE  HEPATIC FUNCTION PANEL  I-STAT BETA HCG BLOOD, ED ([facility], [facility], [facility] ONLY)  TROPONIN I (HIGH SENSITIVITY)  TROPONIN I (HIGH SENSITIVITY)    EKG EKG Interpretation  Date/Time:  Monday July 10 2020 11:51:08 EDT Ventricular Rate:  90 PR Interval:  180 QRS Duration: 76 QT Interval:  350 QTC Calculation: 428 R Axis:   32 Text Interpretation: Normal sinus rhythm Normal ECG Confirmed by Pattricia Boss [phone]) on 07/10/2020 3:13:57 PM   Radiology DG Chest 2 View  Result Date: 07/10/2020 CLINICAL DATA:  Chest pain, shortness of breath. EXAM: CHEST - 2 VIEW COMPARISON:  March 01, 2020. FINDINGS: The heart size and  mediastinal contours are within normal limits. Both lungs are clear. No pneumothorax or pleural  effusion is noted. The visualized skeletal structures are unremarkable. IMPRESSION: No active cardiopulmonary disease. Electronically Signed   By: Marijo Conception M.D.   On: 07/10/2020 12:44   CT Head Wo Contrast  Result Date: 07/10/2020 CLINICAL DATA:  Dizziness, history diabetes mellitus, seizures, deaf EXAM: CT HEAD WITHOUT CONTRAST TECHNIQUE: Contiguous axial images were obtained from the base of the skull through the vertex without intravenous contrast. Sagittal and coronal MPR images reconstructed from axial data set. COMPARISON:  08/26/2017 FINDINGS: Brain: BILATERAL cochlear implants with associated beam hardening artifacts. Normal ventricular morphology. No midline shift or mass effect. Within limitations of beam hardening artifacts, no intracranial hemorrhage, mass lesion or evidence of acute infarction identified. No extra-axial fluid collections. Vascular: No hyperdense vessels Skull: Postoperative changes from cochlear implants. Otherwise unremarkable. Sinuses/Orbits: Clear Other: N/A IMPRESSION: Postoperative changes from BILATERAL cochlear implants. No acute intracranial abnormalities. Electronically Signed   By: Lavonia Dana M.D.   On: 07/10/2020 19:37    Procedures Procedures   Medications Ordered in ED Medications  pregabalin (LYRICA) capsule 150 mg (150 mg Oral Given 07/10/20 1757)  sodium zirconium cyclosilicate (LOKELMA) packet 5 g (has no administration in time range)  acetaminophen (TYLENOL) tablet 650 mg (650 mg Oral Given 07/10/20 1741)  meclizine (ANTIVERT) tablet 12.5 mg (12.5 mg Oral Given 07/10/20 1742)  sodium chloride 0.9 % bolus 500 mL (0 mLs Intravenous Stopped 07/10/20 1951)  ondansetron (ZOFRAN) injection 4 mg (4 mg Intravenous Given 07/10/20 1743)  traMADol (ULTRAM) tablet 50 mg (50 mg Oral Given 07/10/20 1743)  cyclobenzaprine (FLEXERIL) tablet 5 mg (5 mg Oral Given 07/10/20 1742)  albuterol (VENTOLIN HFA) 108 (90 Base) MCG/ACT inhaler 2 puff (2 puffs Inhalation Given  07/10/20 1744)  sodium chloride 0.9 % bolus 500 mL (500 mLs Intravenous New Bag/Given 07/10/20 1951)    ED Course  I have reviewed the triage vital signs and the nursing notes.  Pertinent labs & imaging results that were available during my care of the patient were reviewed by me and considered in my medical decision making (see chart for details).    MDM Rules/Calculators/A&P                          45 year old female presenting to emergency department today for evaluation of 2-week history of shortness of breath.  Is also complaining of dizziness.  Was seen in her cardiologist office prior to arrival and sent here for further evaluation  Reviewed/interpreted labs CBC without leukocytosis, anemia present but appears stable from prior BMP with elevated potassium at 5.3, elevated BUN/creatinine which is worse from prior LFTs are normal Troponins are negative D-dimer negative BNP negative Covid negative  EKG - Normal sinus rhythm Normal ECG  CXR - negative CT head -  Postoperative changes from BILATERAL cochlear implants. No acute intracranial abnormalities.  Patient has AKI however her cardiopulmonary work-up is very reassuring.  She is not hypoxic or tachypneic.  She is requesting to be placed on oxygen..  Have any evidence of heart failure, pneumonia or other emergent cause of symptoms.  On review of PCP notes it appears she has plan to refer patient to pulmonology I agree with.  Will consult cardiology for further recommendations and plan for admission for AKI which is likely prerenal in nature given decreased po intake  6:16 PM CONSULT with Dr. Nadyne Coombes who is aware of pt. He  is in agreement with admission for aki  9:13 PM CONSULT with Dr. Posey Pronto with hospitalist service who accepts patient for admission    Final Clinical Impression(s) / ED Diagnoses Final diagnoses:  AKI (acute kidney injury) [facility])    Rx / [place] Orders ED Discharge Orders    None       Bishop Dublin 07/10/20 2129    Pattricia Boss, MD 07/11/20 [phone]

## 2020-07-10 NOTE — H&P (Signed)
History and Physical    Brandi Love [id number] DOB: 03-Mar-1976 DOA: 07/10/2020  PCP: Bernerd Limbo, MD  Patient coming from: Cardiology office  I have personally briefly reviewed patient's old medical records in [place]  Chief Complaint: Shortness of breath, leg swelling, dizziness  HPI: Brandi Love is a 45 y.o. female with medical history significant for CKD stage III, IDT2DM, congenital hypothyroidism, bilateral sensorineural hearing loss with cochlear implants, hyperlipidemia, hypertriglyceridemia, eating disorder, OCD, depression, anxiety, borderline personality who presented to the ED from her cardiologist's office for evaluation of shortness of breath.  Patient reports 2 weeks of shortness of breath after moderate exertion and when she bends over.  She has been feeling generally weak.  She reports poor oral intake due to feeling as if she gets short of breath when she eats.  She denies any feeling of choking on food or liquids.  She has not had any heartburn/acid reflux.  She reports some nausea without emesis.  She reports nonspecific chest discomfort with exertion.  She reports increased urinary frequency with some dysuria.  She also reports ongoing dizziness and lightheadedness during this time which occur even with rest.  She denies taking any NSAIDs due to her CKD.  She says her CKD as a result of aspirin overdose in 2008.  She says she has obtained a pulse ox and has been checking her O2 levels at home, lowest O2 saturation was 93%.  She has been seen by her PCP for her symptoms and was referred to cardiology.  She was seen by Dr. Einar Gip earlier today. She was noted to be tachycardic with lower extremity edema.  She was unsteady with gait.  Chest pain was felt to be musculoskeletal however there was concern for possible PE therefore she was sent to the ED for further evaluation.   ED Course:  Initial vitals showed BP 130/81, pulse 86, RR 20, temp 98.0 F, SPO2 100%  on room air.  Labs show sodium 135, potassium 5.3, bicarb 20, BUN 53, creatinine 2.53 (variable creatinine on prior labs, baseline appears <2.0), GFR 24, calcium 10.6, WBC 8.9, hemoglobin 11.0, platelets 247,000, D-dimer <0.27, BNP 49.2, high-sensitivity troponin I 3x2, LFTs within normal limits, albumin 4.0, i-STAT beta-hCG <5.0.  SARS-CoV-2 PCR is negative.  Influenza A/B PCR's are negative.  2 view chest x-ray is negative for focal consolidation, edema, or effusion.  CT head without contrast shows postoperative changes from bilateral cochlear implants without acute intracranial abnormality.  Patient was given 500 cc normal saline x2, Lokelma, tramadol, Tylenol, meclizine, Zofran, Flexeril, and albuterol inhaler treatment.  The hospitalist service was consulted to admit for further evaluation and management of AKI on CKD 3.  Review of Systems: All systems reviewed and are negative except as documented in history of present illness above.   Past Medical History:  Diagnosis Date  . Anemia   . Anorexia   . Anxiety   . Arthritis    "hands" (09/18/2016)  . Chronic kidney disease   . Chronic lower back pain   . Chronic renal insufficiency   . Deaf   . Deafness   . Depression   . Diabetes ([place])    "borderline"  . Eating disorder   . Endometriosis   . GERD (gastroesophageal reflux disease)   . History of kidney stones   . Hyperlipidemia   . 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 MDD (major depressive disorder)   . Migraine    "frequency  varies" (09/18/2016)  . OCD (obsessive compulsive disorder)   . Pneumonia 2011  . PONV (postoperative nausea and vomiting)   . Pulmonary embolism ([place]) 10/2001  . Seizures ([place]) 09/2001 X 1   "day after my hysterectomy"  . Seizures ([place])   . Self mutilating behavior   . Tachycardia     Past Surgical History:  Procedure Laterality Date  . ABDOMINAL HYSTERECTOMY  09/2001   'except for right ovary'  . BREAST LUMPECTOMY Left 2000    benign  . BREAST LUMPECTOMY Right 2002   benign  . COCHLEAR IMPLANT Bilateral 2004-2010   "right-left"  . COLONOSCOPY  06/2015    Dr Rolm Bookbinder at [facility] for rectal bleeding: internal hemorrhoids, small anal fissure.    . ESOPHAGOGASTRODUODENOSCOPY N/A 10/07/2016   Procedure: ESOPHAGOGASTRODUODENOSCOPY (EGD);  Surgeon: Ladene Artist, MD;  Location: [facility] ENDOSCOPY;  Service: Endoscopy;  Laterality: N/A;  . EYE MUSCLE SURGERY Left 1980   "lazy eye"  . EYE MUSCLE SURGERY Right 1990   "lazy eye"  . LAPAROSCOPIC ABDOMINAL EXPLORATION  2001 and 2002   for endometriosis  . OOPHORECTOMY Right 08/2019    Social History:  reports that she has never smoked. She has never used smokeless tobacco. She reports that she does not drink alcohol and does not use drugs.  Allergies  Allergen Reactions  . Fish Oil Anaphylaxis    Facial edema, itching  . Iodinated Diagnostic Agents Anaphylaxis    'cant breathe'  . Melatonin Itching  . Other Itching, Swelling, Rash and Anaphylaxis    Unable to take due to renal insufficency Unable to ttake due to renal insufficency Eyes swell 'cant breathe' 'cant breathe' 'cant breathe' 'cant breathe' Other reaction(s): Other (See Comments) Patient stated she can not take because she has renal insufficiency Unable to ttake due to renal insufficency 'cant breathe'  . Phenylephrine-Guaifenesin Shortness Of Breath and Swelling    Throat swelling  . Propoxyphene Anaphylaxis  . Abilify [Aripiprazole] Nausea And Vomiting  . Dhea [Nutritional Supplements] Nausea And Vomiting and Swelling  . Dihydroergotamine Nausea And Vomiting  . Divalproex Sodium Nausea And Vomiting and Other (See Comments)    Weight gain  . Doxycycline Nausea And Vomiting  . Erythromycin Nausea And Vomiting  . Indomethacin Nausea And Vomiting  . Lactose Intolerance (Gi) Nausea And Vomiting  . Melatonin Itching  . Risperidone And Related Other (See Comments)    Out of body feeling   . Shellfish Allergy Itching and Swelling    Eyes swell  . Statins Other (See Comments)    Cannot tolerate  . Tape Other (See Comments)    Redness/ please use paper tape  . Dihydroergotamine Nausea And Vomiting  . Lactose Intolerance (Gi) Nausea And Vomiting  . Nsaids Other (See Comments)    Unable to ttake due to renal insufficency  . Sulfa Antibiotics Itching    Redness/ please use paper tape Redness, can use paper tape  . Tape Other (See Comments)    Redness, can use paper tape    Family History  Problem Relation Age of Onset  . Cancer Father   . Hyperlipidemia Father   . Osteoarthritis Mother   . Hyperlipidemia Mother      Prior to Admission medications   Medication Sig Start Date End Date Taking? Authorizing Provider  acetaminophen (TYLENOL) 500 MG tablet Take 1,000-3,000 mg by mouth See admin instructions. Take 2 tablets in the morning, at lunch and take 6 tablets at bedtime    [provider]  allopurinol (ZYLOPRIM) 300 MG tablet Take by mouth. 08/12/19   [provider]  amLODipine (NORVASC) 10 MG tablet Take 1 tablet by mouth once daily 05/04/20   Cantwell, Celeste C, PA-C  aspirin EC 81 MG tablet Take 81 mg by mouth daily.    [provider]  carvedilol (COREG) 12.5 MG tablet Take 12.5 mg by mouth 2 (two) times daily. 07/09/20   [provider]  cyclobenzaprine (FLEXERIL) 5 MG tablet Take by mouth 3 (three) times daily as needed.  06/30/18   [provider]  D3-50 1.25 MG (50000 UT) capsule Take 50,000 Units by mouth once a week. 08/08/19   [provider]  dicyclomine (BENTYL) 20 MG tablet Take 1 tablet (20 mg total) by mouth 2 (two) times daily. 11/16/19   Couture, Cortni S, PA-C  docusate sodium (COLACE) 100 MG capsule Take 2 capsules at bedtime 07/14/18   [provider]  estradiol (CLIMARA - DOSED IN MG/24 HR) 0.05 mg/24hr patch Place 0.05 mg onto the skin once a week. 03/24/20   [provider]   gemfibrozil (LOPID) 600 MG tablet 600 mg in the morning and 300 mg in the evening 09/09/17   [provider]  glimepiride (AMARYL) 4 MG tablet Take 4 mg by mouth daily. 06/15/19   [provider]  insulin aspart (NOVOLOG) 100 UNIT/ML injection Inject 45 Units into the skin once.    [provider]  INVOKANA 300 MG TABS tablet Take 300 mg by mouth every morning. 06/23/20   [provider]  levothyroxine (SYNTHROID) 137 MCG tablet Take 150 mcg by mouth daily.  07/17/17   [provider]  magnesium oxide (MAG-OX) 400 (241.3 Mg) MG tablet Take by mouth 2 (two) times a day. 11/06/17   [provider]  meclizine (ANTIVERT) 25 MG tablet Take 25 mg by mouth 3 (three) times daily as needed. 06/03/19   [provider]  Melatonin 5 MG TABS Take 3 mg by mouth at bedtime.    [provider]  metoCLOPramide (REGLAN) 10 MG tablet Take 1 tablet (10 mg total) by mouth every 6 (six) hours as needed for nausea or vomiting. 11/07/19   Molpus, John, MD  Multiple Vitamin (MULTIVITAMIN WITH MINERALS) TABS Take 1 tablet by mouth daily.    [provider]  pantoprazole (PROTONIX) 40 MG tablet Take 40 mg by mouth daily.    [provider]  pregabalin (LYRICA) 150 MG capsule Take 150 mg by mouth 3 (three) times daily. 08/10/19   [provider]  promethazine (PHENERGAN) 25 MG tablet Take 25 mg by mouth every 8 (eight) hours as needed. 06/02/19   [provider]  sertraline (ZOLOFT) 50 MG tablet Take 50 mg by mouth daily. 07/09/20   [provider]  traMADol (ULTRAM) 50 MG tablet Take 100 mg by mouth every 8 (eight) hours as needed.  07/19/19   [provider]    Physical Exam: Vitals:   07/10/20 1730 07/10/20 1745 07/10/20 1800 07/10/20 1815  BP: (!) 140/96 (!) 138/94 (!) 142/103 (!) 143/86  Pulse: 93 93 93 92  Resp: '20 12 15 16  '$ Temp:      TempSrc:      SpO2: 96% 97% 99% 97%   Constitutional: Resting  in bed with head elevated, NAD, calm, comfortable Eyes: PERRL, lids and conjunctivae normal ENMT: Mucous membranes are moist. Posterior pharynx clear of any exudate or lesions.Normal dentition.  Hearing impaired, using cochlear hearing  aids. Neck: normal, supple, no masses. Respiratory: clear to auscultation bilaterally, no wheezing, no crackles. Normal respiratory effort. No accessory muscle use.  Cardiovascular: Regular rate and rhythm, no murmurs / rubs / gallops. No extremity edema. 2+ pedal pulses. Abdomen: no tenderness, no masses palpated. Bowel sounds positive.  Musculoskeletal: no clubbing / cyanosis. No joint deformity upper and lower extremities. Good ROM, no contractures. Normal muscle tone.  Skin: Dry, no rashes, lesions, ulcers. No induration Neurologic: CN 2-12 grossly intact. Sensation intact. Strength 5/5 in all 4.  Psychiatric: Normal judgment and insight. Alert and oriented x 3. Normal mood.   Labs on Admission: I have personally reviewed following labs and imaging studies  CBC: Recent Labs  Lab 07/10/20 1148  WBC 8.9  HGB 11.0*  HCT 33.0*  MCV 90.2  PLT [id number]   Basic Metabolic Panel: Recent Labs  Lab 07/10/20 1148  NA 135  K 5.3*  CL 104  CO2 20*  GLUCOSE 204*  BUN 53*  CREATININE 2.53*  CALCIUM 10.6*   GFR: Estimated Creatinine Clearance: 33.1 mL/min (A) (by C-G formula based on SCr of 2.53 mg/dL (H)). Liver Function Tests: Recent Labs  Lab 07/10/20 1530  AST 16  ALT 20  ALKPHOS 67  BILITOT 0.6  PROT 6.9  ALBUMIN 4.0   No results for input(s): LIPASE, AMYLASE in the last 168 hours. No results for input(s): AMMONIA in the last 168 hours. Coagulation Profile: No results for input(s): INR, PROTIME in the last 168 hours. Cardiac Enzymes: No results for input(s): CKTOTAL, CKMB, CKMBINDEX, TROPONINI in the last 168 hours. BNP (last 3 results) No results for input(s): PROBNP in the last 8760 hours. HbA1C: No results for input(s): HGBA1C in the  last 72 hours. CBG: No results for input(s): GLUCAP in the last 168 hours. Lipid Profile: No results for input(s): CHOL, HDL, LDLCALC, TRIG, CHOLHDL, LDLDIRECT in the last 72 hours. Thyroid Function Tests: No results for input(s): TSH, T4TOTAL, FREET4, T3FREE, THYROIDAB in the last 72 hours. Anemia Panel: No results for input(s): VITAMINB12, FOLATE, FERRITIN, TIBC, IRON, RETICCTPCT in the last 72 hours. Urine analysis:    Component Value Date/Time   COLORURINE YELLOW 03/01/2020 2123   APPEARANCEUR CLEAR 03/01/2020 2123   LABSPEC <1.005 (L) 03/01/2020 2123   PHURINE 6.0 03/01/2020 2123   GLUCOSEU >=500 (A) 03/01/2020 2123   HGBUR NEGATIVE 03/01/2020 2123   BILIRUBINUR NEGATIVE 03/01/2020 2123   KETONESUR NEGATIVE 03/01/2020 2123   PROTEINUR NEGATIVE 03/01/2020 2123   UROBILINOGEN 0.2 12/14/2012 0816   NITRITE NEGATIVE 03/01/2020 2123   LEUKOCYTESUR NEGATIVE 03/01/2020 2123    Radiological Exams on Admission: DG Chest 2 View  Result Date: 07/10/2020 CLINICAL DATA:  Chest pain, shortness of breath. EXAM: CHEST - 2 VIEW COMPARISON:  March 01, 2020. FINDINGS: The heart size and mediastinal contours are within normal limits. Both lungs are clear. No pneumothorax or pleural effusion is noted. The visualized skeletal structures are unremarkable. IMPRESSION: No active cardiopulmonary disease. Electronically Signed   By: Marijo Conception M.D.   On: 07/10/2020 12:44   CT Head Wo Contrast  Result Date: 07/10/2020 CLINICAL DATA:  Dizziness, history diabetes mellitus, seizures, deaf EXAM: CT HEAD WITHOUT CONTRAST TECHNIQUE: Contiguous axial images were obtained from the base of the skull through the vertex without intravenous contrast. Sagittal and coronal MPR images reconstructed from axial data set. COMPARISON:  08/26/2017 FINDINGS: Brain: BILATERAL cochlear implants with associated beam hardening artifacts. Normal ventricular morphology. No midline shift or mass effect. Within limitations of  beam hardening artifacts, no intracranial hemorrhage, mass lesion or evidence of acute infarction identified. No extra-axial fluid collections. Vascular: No hyperdense vessels Skull: Postoperative changes from cochlear implants. Otherwise unremarkable. Sinuses/Orbits: Clear Other: N/A IMPRESSION: Postoperative changes from BILATERAL cochlear implants. No acute intracranial abnormalities. Electronically Signed   By: Lavonia Dana M.D.   On: 07/10/2020 19:37    EKG: Personally reviewed. Normal sinus rhythm without acute ischemic changes.  EKG from cardiology office showed sinus tachycardia, rate 101, incomplete RBBB.  Assessment/Plan Principal Problem:   Acute renal failure superimposed on stage 3 chronic kidney disease (HCC) Active Problems:   Hyperlipidemia associated with type 2 diabetes mellitus (HCC)   Congenital hypothyroidism   Hypertension associated with diabetes ([place])   Insulin dependent type 2 diabetes mellitus (HCC)   Hyperkalemia   Cyerra Lehne Nelms is a 45 y.o. female with medical history significant for CKD stage III, IDT2DM, congenital hypothyroidism, bilateral sensorineural hearing loss with cochlear implants, hyperlipidemia, hypertriglyceridemia, eating disorder, OCD, depression, anxiety, borderline personality who is admitted with AKI on CKD stage III.  AKI on CKD stage III: Likely prerenal from decreased oral intake.  Denies NSAID use. -Continue IV fluid hydration overnight -Check urine studies -Renal ultrasound -Avoid NSAIDs and nephrotoxic agents  Hyperkalemia: Mild and likely due to AKI.  Receiving Lokelma in the ED.  Continue IV fluid hydration.  Lightheadedness/dizziness: Nonspecific, will check orthostatic vital signs, continue IV fluid hydration, and request PT/OT eval.  Dysuria/urinary frequency: Check urinalysis and start antibiotics if suggestive of UTI.  Shortness of breath/chest discomfort: Chest discomfort likely musculoskeletal in etiology.  No objective  evidence of acute respiratory issue.  EKG is reassuring.  Troponin negative x2.  D-dimer negative.  BNP within normal limits.  CXR is clear.  O2 saturations appropriate on room air.  No wheezing, crackles, rhonchi on exam.  Insulin-dependent type 2 diabetes: Place on reduced home Lantus 30 units nightly, sensitive SSI.  Holding home Amaryl and Invokana for now.  Hypothyroidism: Continue Synthroid.  Hypertension: Plan to resume amlodipine and Coreg tomorrow if orthostatic vitals negative.  Hyperlipidemia/hypertriglyceridemia: Continue gemfibrozil.  Depression/anxiety/eating disorder/BPD: Continue sertraline.  DVT prophylaxis: Lovenox Code Status: DNI, confirmed with patient on admission Family Communication: Discussed with patient, she has discussed with family Disposition Plan: From home and likely discharge to home pending improvement in renal function PT/OT eval Consults called: None Level of care: Med-Surg Admission status:  Status is: Observation  The patient remains OBS appropriate and will d/c before 2 midnights.  Dispo: The patient is from: Home              Anticipated d/c is to: Home              Patient currently is not medically stable to d/c.   Difficult to place patient No  Zada Finders MD [facility]  If 7PM-7AM, please contact night-coverage [email or web address]  07/10/2020, 10:18 PM

## 2020-07-10 NOTE — ED Triage Notes (Signed)
Pt arrives to ED from cardiology office [facility]. Pt was sent to cardiology due to CP and shortness of breath along with bilateral leg swelling that began 1 week ago. Pt states breathing is worse upon lying flat and exertion.

## 2020-07-10 NOTE — ED Notes (Signed)
Pt being assisted to use bedpan and is updated on status of stay. CT contacted to facilitate exam and will be transported asap.

## 2020-07-11 DIAGNOSIS — R0602 Shortness of breath: Secondary | ICD-10-CM | POA: Diagnosis present

## 2020-07-11 DIAGNOSIS — F419 Anxiety disorder, unspecified: Secondary | ICD-10-CM | POA: Diagnosis present

## 2020-07-11 DIAGNOSIS — N183 Chronic kidney disease, stage 3 unspecified: Secondary | ICD-10-CM | POA: Diagnosis not present

## 2020-07-11 DIAGNOSIS — E1122 Type 2 diabetes mellitus with diabetic chronic kidney disease: Secondary | ICD-10-CM | POA: Diagnosis present

## 2020-07-11 DIAGNOSIS — N1832 Chronic kidney disease, stage 3b: Secondary | ICD-10-CM | POA: Diagnosis present

## 2020-07-11 DIAGNOSIS — E1165 Type 2 diabetes mellitus with hyperglycemia: Secondary | ICD-10-CM | POA: Diagnosis present

## 2020-07-11 DIAGNOSIS — Z20822 Contact with and (suspected) exposure to covid-19: Secondary | ICD-10-CM | POA: Diagnosis present

## 2020-07-11 DIAGNOSIS — G894 Chronic pain syndrome: Secondary | ICD-10-CM | POA: Diagnosis present

## 2020-07-11 DIAGNOSIS — F429 Obsessive-compulsive disorder, unspecified: Secondary | ICD-10-CM | POA: Diagnosis present

## 2020-07-11 DIAGNOSIS — E875 Hyperkalemia: Secondary | ICD-10-CM | POA: Diagnosis present

## 2020-07-11 DIAGNOSIS — F603 Borderline personality disorder: Secondary | ICD-10-CM | POA: Diagnosis present

## 2020-07-11 DIAGNOSIS — E781 Pure hyperglyceridemia: Secondary | ICD-10-CM | POA: Diagnosis present

## 2020-07-11 DIAGNOSIS — N179 Acute kidney failure, unspecified: Secondary | ICD-10-CM | POA: Diagnosis present

## 2020-07-11 DIAGNOSIS — H903 Sensorineural hearing loss, bilateral: Secondary | ICD-10-CM | POA: Diagnosis present

## 2020-07-11 DIAGNOSIS — R0781 Pleurodynia: Secondary | ICD-10-CM | POA: Diagnosis present

## 2020-07-11 DIAGNOSIS — H81399 Other peripheral vertigo, unspecified ear: Secondary | ICD-10-CM | POA: Diagnosis present

## 2020-07-11 DIAGNOSIS — E1169 Type 2 diabetes mellitus with other specified complication: Secondary | ICD-10-CM | POA: Diagnosis present

## 2020-07-11 DIAGNOSIS — F32A Depression, unspecified: Secondary | ICD-10-CM | POA: Diagnosis present

## 2020-07-11 DIAGNOSIS — E031 Congenital hypothyroidism without goiter: Secondary | ICD-10-CM | POA: Diagnosis present

## 2020-07-11 DIAGNOSIS — E872 Acidosis: Secondary | ICD-10-CM | POA: Diagnosis present

## 2020-07-11 DIAGNOSIS — M797 Fibromyalgia: Secondary | ICD-10-CM | POA: Diagnosis present

## 2020-07-11 DIAGNOSIS — K5900 Constipation, unspecified: Secondary | ICD-10-CM | POA: Diagnosis present

## 2020-07-11 DIAGNOSIS — F509 Eating disorder, unspecified: Secondary | ICD-10-CM | POA: Diagnosis present

## 2020-07-11 DIAGNOSIS — I129 Hypertensive chronic kidney disease with stage 1 through stage 4 chronic kidney disease, or unspecified chronic kidney disease: Secondary | ICD-10-CM | POA: Diagnosis present

## 2020-07-11 DIAGNOSIS — E785 Hyperlipidemia, unspecified: Secondary | ICD-10-CM | POA: Diagnosis present

## 2020-07-11 DIAGNOSIS — E8770 Fluid overload, unspecified: Secondary | ICD-10-CM | POA: Diagnosis present

## 2020-07-11 LAB — HEMOGLOBIN A1C
Hgb A1c MFr Bld: 6.8 % — ABNORMAL HIGH (ref 4.8–5.6)
Mean Plasma Glucose: 148.46 mg/dL

## 2020-07-11 LAB — CBC
HCT: 31.4 % — ABNORMAL LOW (ref 36.0–46.0)
Hemoglobin: 10.3 g/dL — ABNORMAL LOW (ref 12.0–15.0)
MCH: 29.6 pg (ref 26.0–34.0)
MCHC: 32.8 g/dL (ref 30.0–36.0)
MCV: 90.2 fL (ref 80.0–100.0)
Platelets: 225 10*3/uL (ref 150–400)
RBC: 3.48 MIL/uL — ABNORMAL LOW (ref 3.87–5.11)
RDW: 14 % (ref 11.5–15.5)
WBC: 5.9 10*3/uL (ref 4.0–10.5)
nRBC: 0 % (ref 0.0–0.2)

## 2020-07-11 LAB — MRSA PCR SCREENING: MRSA by PCR: POSITIVE — AB

## 2020-07-11 LAB — GLUCOSE, CAPILLARY
Glucose-Capillary: 115 mg/dL — ABNORMAL HIGH (ref 70–99)
Glucose-Capillary: 136 mg/dL — ABNORMAL HIGH (ref 70–99)
Glucose-Capillary: 229 mg/dL — ABNORMAL HIGH (ref 70–99)
Glucose-Capillary: 139 mg/dL — ABNORMAL HIGH (ref 70–99)
Glucose-Capillary: 157 mg/dL — ABNORMAL HIGH (ref 70–99)

## 2020-07-11 LAB — BASIC METABOLIC PANEL
Anion gap: 11 (ref 5–15)
BUN: 49 mg/dL — ABNORMAL HIGH (ref 6–20)
CO2: 19 mmol/L — ABNORMAL LOW (ref 22–32)
Calcium: 10.2 mg/dL (ref 8.9–10.3)
Chloride: 109 mmol/L (ref 98–111)
Creatinine, Ser: 2.38 mg/dL — ABNORMAL HIGH (ref 0.44–1.00)
GFR, Estimated: 25 mL/min — ABNORMAL LOW (ref >60)
Glucose, Bld: 123 mg/dL — ABNORMAL HIGH (ref 70–99)
Potassium: 4.7 mmol/L (ref 3.5–5.1)
Sodium: 139 mmol/L (ref 135–145)

## 2020-07-11 LAB — MAGNESIUM: Magnesium: 2.1 mg/dL (ref 1.7–2.4)

## 2020-07-11 LAB — PHOSPHORUS: Phosphorus: 5.9 mg/dL — ABNORMAL HIGH (ref 2.5–4.6)

## 2020-07-11 LAB — HIV ANTIBODY (ROUTINE TESTING W REFLEX): HIV Screen 4th Generation wRfx: NONREACTIVE

## 2020-07-11 LAB — TSH: TSH: 0.526 u[IU]/mL (ref 0.350–4.500)

## 2020-07-11 MED ORDER — TRAMADOL HCL 50 MG PO TABS
50.0000 mg | ORAL_TABLET | Freq: Three times a day (TID) | ORAL | Status: DC | PRN
  Administered 2020-07-11: 50 mg via ORAL
  Filled 2020-07-11: qty 1

## 2020-07-11 MED ORDER — ALLOPURINOL 300 MG PO TABS
300.0000 mg | ORAL_TABLET | Freq: Every day | ORAL | Status: DC
Start: 2020-07-11 — End: 2020-07-16
  Administered 2020-07-11 – 2020-07-14 (×4): 300 mg via ORAL
  Filled 2020-07-11 (×4): qty 1

## 2020-07-11 MED ORDER — ALBUTEROL SULFATE HFA 108 (90 BASE) MCG/ACT IN AERS
1.0000 | INHALATION_SPRAY | Freq: Four times a day (QID) | RESPIRATORY_TRACT | Status: DC | PRN
  Administered 2020-07-11 – 2020-07-12 (×3): 2 via RESPIRATORY_TRACT
  Administered 2020-07-13: 1 via RESPIRATORY_TRACT
  Administered 2020-07-13: 2 via RESPIRATORY_TRACT
  Administered 2020-07-14: 1 via RESPIRATORY_TRACT
  Administered 2020-07-14 (×2): 2 via RESPIRATORY_TRACT
  Filled 2020-07-11: qty 6.7

## 2020-07-11 MED ORDER — CHLORHEXIDINE GLUCONATE CLOTH 2 % EX PADS
6.0000 | MEDICATED_PAD | Freq: Every day | CUTANEOUS | Status: DC
  Administered 2020-07-11 – 2020-07-14 (×2): 6 via TOPICAL

## 2020-07-11 MED ORDER — TRAZODONE HCL 50 MG PO TABS
25.0000 mg | ORAL_TABLET | Freq: Every evening | ORAL | Status: DC | PRN
  Administered 2020-07-14: 25 mg via ORAL
  Filled 2020-07-11 (×2): qty 1

## 2020-07-11 MED ORDER — TRAMADOL HCL 50 MG PO TABS
100.0000 mg | ORAL_TABLET | Freq: Three times a day (TID) | ORAL | Status: DC | PRN
  Administered 2020-07-11 – 2020-07-13 (×6): 100 mg via ORAL
  Filled 2020-07-11 (×7): qty 2

## 2020-07-11 MED ORDER — CYCLOBENZAPRINE HCL 5 MG PO TABS
5.0000 mg | ORAL_TABLET | Freq: Three times a day (TID) | ORAL | Status: DC | PRN
  Administered 2020-07-11 – 2020-07-14 (×9): 5 mg via ORAL
  Filled 2020-07-11 (×8): qty 1

## 2020-07-11 MED ORDER — PREGABALIN 75 MG PO CAPS: 150.0000 mg | ORAL_CAPSULE | Freq: Three times a day (TID) | ORAL | Status: DC

## 2020-07-11 MED ORDER — ASPIRIN EC 81 MG PO TBEC
81.0000 mg | DELAYED_RELEASE_TABLET | Freq: Every day | ORAL | Status: DC
  Administered 2020-07-12 – 2020-07-15 (×4): 81 mg via ORAL
  Filled 2020-07-11 (×4): qty 1

## 2020-07-11 MED ORDER — LURASIDONE HCL 20 MG PO TABS
20.0000 mg | ORAL_TABLET | Freq: Every day | ORAL | Status: DC
  Administered 2020-07-11 – 2020-07-14 (×4): 20 mg via ORAL
  Filled 2020-07-11 (×5): qty 1

## 2020-07-11 MED ORDER — MELATONIN 5 MG PO TABS
5.0000 mg | ORAL_TABLET | Freq: Every day | ORAL | Status: DC
  Administered 2020-07-11 – 2020-07-14 (×4): 5 mg via ORAL
  Filled 2020-07-11 (×4): qty 1

## 2020-07-11 MED ORDER — MECLIZINE HCL 25 MG PO TABS
12.5000 mg | ORAL_TABLET | Freq: Three times a day (TID) | ORAL | Status: DC | PRN
  Administered 2020-07-11 – 2020-07-12 (×3): 12.5 mg via ORAL
  Filled 2020-07-11 (×4): qty 1

## 2020-07-11 MED ORDER — MUPIROCIN 2 % EX OINT
1.0000 "application " | TOPICAL_OINTMENT | Freq: Two times a day (BID) | CUTANEOUS | Status: DC
  Administered 2020-07-11 – 2020-07-14 (×6): 1 via NASAL
  Filled 2020-07-11 (×2): qty 22

## 2020-07-11 MED ORDER — INSULIN ASPART 100 UNIT/ML ~~LOC~~ SOLN
0.0000 [IU] | Freq: Three times a day (TID) | SUBCUTANEOUS | Status: DC
  Administered 2020-07-11: 2 [IU] via SUBCUTANEOUS
  Administered 2020-07-11: 3 [IU] via SUBCUTANEOUS
  Administered 2020-07-12 – 2020-07-13 (×4): 2 [IU] via SUBCUTANEOUS
  Administered 2020-07-14: 5 [IU] via SUBCUTANEOUS
  Administered 2020-07-14: 2 [IU] via SUBCUTANEOUS
  Administered 2020-07-14 – 2020-07-15 (×3): 3 [IU] via SUBCUTANEOUS
  Administered 2020-07-15: 2 [IU] via SUBCUTANEOUS

## 2020-07-11 MED ORDER — INSULIN ASPART 100 UNIT/ML ~~LOC~~ SOLN
0.0000 [IU] | Freq: Every day | SUBCUTANEOUS | Status: DC
  Administered 2020-07-12: 2 [IU] via SUBCUTANEOUS
  Administered 2020-07-13: 0 [IU] via SUBCUTANEOUS

## 2020-07-11 NOTE — Progress Notes (Signed)
PROGRESS NOTE    Brandi Love  [id number] DOB: 02-14-1976 DOA: 07/10/2020 PCP: Bernerd Limbo, MD   Brief Narrative:  Brandi Love is a 45 y.o. female with medical history significant for CKD stage III, IDT2DM, congenital hypothyroidism, bilateral sensorineural hearing loss with cochlear implants, hyperlipidemia, hypertriglyceridemia, eating disorder, OCD, depression, anxiety, borderline personality who presented to the ED from her cardiologist's office for evaluation of shortness of breath.  Patient was admitted for AKI on CKD stage IIIa and was started on IV fluid.  Assessment & Plan:   Principal Problem:   Acute renal failure superimposed on stage 3 chronic kidney disease (HCC) Active Problems:   Hyperlipidemia associated with type 2 diabetes mellitus (HCC)   Congenital hypothyroidism   Hypertension associated with diabetes ([place])   Insulin dependent type 2 diabetes mellitus (HCC)   Hyperkalemia   AKI on CKD stage III-improving Likely prerenal from decreased oral intake.  Denies NSAID use. -Hold further IV fluid for now and monitor repeat labs -Check urine studies with postobstructive pattern, discussed with nephrology -Renal ultrasound with findings of parenchymal disease and no obstruction -Avoid NSAIDs and nephrotoxic agents -Discussed with nephrology regarding some mild volume overload with AKI.  Recommendation to hold IV fluid and nephrotoxic agents for now and monitor repeat labs in a.m.  Consider consultation formally in a.m. if creatinine levels continue to trend upwards.  Baseline creatinine near 1.6-2.  Hyperkalemia-resolved Mild and likely due to AKI.    Patient received Lokelma in ED.  Continue to monitor repeat labs in a.m.  Lightheadedness/dizziness: Nonspecific, will check orthostatic vital signs, continue IV fluid hydration, and request PT/OT eval.  Dysuria/urinary frequency: No sign of UTI noted  Shortness of breath/chest discomfort: Chest  discomfort likely musculoskeletal in etiology.  No objective evidence of acute respiratory issue.  EKG is reassuring.  Troponin negative x2.  D-dimer negative.  BNP within normal limits.  CXR is clear.  O2 saturations appropriate on room air.  No wheezing, crackles, rhonchi on exam.  Insulin-dependent type 2 diabetes: Place on reduced home Lantus 30 units nightly, sensitive SSI to moderate scale.  Holding home Amaryl and Invokana for now. A1c 6.8%.  Hypothyroidism: Continue Synthroid.  Hypertension: Hold amlodipine and Coreg for now and monitor blood pressures.  Hyperlipidemia/hypertriglyceridemia: Continue gemfibrozil.  Depression/anxiety/eating disorder/BPD: Continue sertraline.   DVT prophylaxis:Lovenox Code Status: DNI Family Communication: Discussed with mother on phone 3/22 Disposition Plan:  Status is: Observation  The patient will require care spanning > 2 midnights and should be moved to inpatient because: IV treatments appropriate due to intensity of illness or inability to take PO and Inpatient level of care appropriate due to severity of illness  Dispo: The patient is from: Home              Anticipated d/c is to: Home              Patient currently is not medically stable to d/c.   Difficult to place patient No   Consultants:   Discussed with Nephrology Dr. Candiss Norse  Procedures:   See below  Antimicrobials:   None   Subjective: Patient seen and evaluated today with no new acute complaints or concerns. No acute concerns or events noted overnight.  She denies any significant shortness of breath, but does continue to require some nasal cannula oxygen for comfort.  Objective: Vitals:   07/11/20 0230 07/11/20 0330 07/11/20 0859 07/11/20 0908  BP: 122/90 109/68 135/81 135/81  Pulse: 93 89  100  Resp:  $'13 16  16  'u$ Temp:  98.2 F (36.8 C)  98.4 F (36.9 C)  TempSrc:  Oral  Oral  SpO2: 97% 98%  96%  Weight:  81.9 kg     No intake or output data in the  24 hours ending 07/11/20 1220 Filed Weights   07/11/20 0330  Weight: 81.9 kg    Examination:  General exam: Appears calm and comfortable  Respiratory system: Clear to auscultation. Respiratory effort normal.  Currently on 2 L nasal cannula oxygen Cardiovascular system: S1 & S2 heard, RRR.  Gastrointestinal system: Abdomen is soft, but mildly edematous Central nervous system: Alert and awake Extremities: No edema Skin: No significant lesions noted Psychiatry: Flat affect.    Data Reviewed: I have personally reviewed following labs and imaging studies  CBC: Recent Labs  Lab 07/10/20 1148 07/11/20 0404  WBC 8.9 5.9  HGB 11.0* 10.3*  HCT 33.0* 31.4*  MCV 90.2 90.2  PLT [id number]   Basic Metabolic Panel: Recent Labs  Lab 07/10/20 1148 07/11/20 0404  NA 135 139  K 5.3* 4.7  CL 104 109  CO2 20* 19*  GLUCOSE 204* 123*  BUN 53* 49*  CREATININE 2.53* 2.38*  CALCIUM 10.6* 10.2  MG  --  2.1  PHOS  --  5.9*   GFR: Estimated Creatinine Clearance: 34.6 mL/min (A) (by C-G formula based on SCr of 2.38 mg/dL (H)). Liver Function Tests: Recent Labs  Lab 07/10/20 1530  AST 16  ALT 20  ALKPHOS 67  BILITOT 0.6  PROT 6.9  ALBUMIN 4.0   No results for input(s): LIPASE, AMYLASE in the last 168 hours. No results for input(s): AMMONIA in the last 168 hours. Coagulation Profile: No results for input(s): INR, PROTIME in the last 168 hours. Cardiac Enzymes: No results for input(s): CKTOTAL, CKMB, CKMBINDEX, TROPONINI in the last 168 hours. BNP (last 3 results) No results for input(s): PROBNP in the last 8760 hours. HbA1C: Recent Labs    07/11/20 0412  HGBA1C 6.8*   CBG: Recent Labs  Lab 07/11/20 0627 07/11/20 0738 07/11/20 1151  GLUCAP 115* 136* 229*   Lipid Profile: No results for input(s): CHOL, HDL, LDLCALC, TRIG, CHOLHDL, LDLDIRECT in the last 72 hours. Thyroid Function Tests: Recent Labs    07/11/20 0404  TSH 0.526   Anemia Panel: No results for  input(s): VITAMINB12, FOLATE, FERRITIN, TIBC, IRON, RETICCTPCT in the last 72 hours. Sepsis Labs: No results for input(s): PROCALCITON, LATICACIDVEN in the last 168 hours.  Recent Results (from the past 240 hour(s))  Resp Panel by RT-PCR (Flu A&B, Covid) Nasopharyngeal Swab     Status: None   Collection Time: 07/10/20  2:57 PM   Specimen: Nasopharyngeal Swab; Nasopharyngeal(NP) swabs in vial transport medium  Result Value Ref Range Status   SARS Coronavirus 2 by RT PCR NEGATIVE NEGATIVE Final    Comment: (NOTE) SARS-CoV-2 target nucleic acids are NOT DETECTED.  The SARS-CoV-2 RNA is generally detectable in upper respiratory specimens during the acute phase of infection. The lowest concentration of SARS-CoV-2 viral copies this assay can detect is 138 copies/mL. A negative result does not preclude SARS-Cov-2 infection and should not be used as the sole basis for treatment or other patient management decisions. A negative result may occur with  improper specimen collection/handling, submission of specimen other than nasopharyngeal swab, presence of viral mutation(s) within the areas targeted by this assay, and inadequate number of viral copies(<138 copies/mL). A negative result must be combined with clinical observations, patient history,  and epidemiological information. The expected result is Negative.  Fact Sheet for Patients:  EntrepreneurPulse.com.au  Fact Sheet for Healthcare Providers:  IncredibleEmployment.be  This test is no t yet approved or cleared by the [place] FDA and  has been authorized for detection and/or diagnosis of SARS-CoV-2 by FDA under an Emergency Use Authorization (EUA). This EUA will remain  in effect (meaning this test can be used) for the duration of the COVID-19 declaration under Section 564(b)(1) of the Act, 21 U.S.C.section 360bbb-3(b)(1), unless the authorization is terminated  or revoked sooner.        Influenza A by PCR NEGATIVE NEGATIVE Final   Influenza B by PCR NEGATIVE NEGATIVE Final    Comment: (NOTE) The Xpert Xpress SARS-CoV-2/FLU/RSV plus assay is intended as an aid in the diagnosis of influenza from Nasopharyngeal swab specimens and should not be used as a sole basis for treatment. Nasal washings and aspirates are unacceptable for Xpert Xpress SARS-CoV-2/FLU/RSV testing.  Fact Sheet for Patients: EntrepreneurPulse.com.au  Fact Sheet for Healthcare Providers: IncredibleEmployment.be  This test is not yet approved or cleared by the [place] FDA and has been authorized for detection and/or diagnosis of SARS-CoV-2 by FDA under an Emergency Use Authorization (EUA). This EUA will remain in effect (meaning this test can be used) for the duration of the COVID-19 declaration under Section 564(b)(1) of the Act, 21 U.S.C. section 360bbb-3(b)(1), unless the authorization is terminated or revoked.  Performed at [facility], [place] [address], [place], [place] [address]   MRSA PCR Screening     Status: Abnormal   Collection Time: 07/11/20  5:34 AM   Specimen: Nasal Mucosa; Nasopharyngeal  Result Value Ref Range Status   MRSA by PCR POSITIVE (A) NEGATIVE Final    Comment:        The GeneXpert MRSA Assay (FDA approved for NASAL specimens only), is one component of a comprehensive MRSA colonization surveillance program. It is not intended to diagnose MRSA infection nor to guide or monitor treatment for MRSA infections. RESULT CALLED TO, READ BACK BY AND VERIFIED WITH: YAP,C RN 07/11/2020 AT [id number] SKEEN,P Performed at [facility], [place] [address] [place],  [address]          Radiology Studies: DG Chest 2 View  Result Date: 07/10/2020 CLINICAL DATA:  Chest pain, shortness of breath. EXAM: CHEST - 2 VIEW COMPARISON:  March 01, 2020. FINDINGS: The heart size and mediastinal contours are within normal limits. Both  lungs are clear. No pneumothorax or pleural effusion is noted. The visualized skeletal structures are unremarkable. IMPRESSION: No active cardiopulmonary disease. Electronically Signed   By: Marijo Conception M.D.   On: 07/10/2020 12:44   CT Head Wo Contrast  Result Date: 07/10/2020 CLINICAL DATA:  Dizziness, history diabetes mellitus, seizures, deaf EXAM: CT HEAD WITHOUT CONTRAST TECHNIQUE: Contiguous axial images were obtained from the base of the skull through the vertex without intravenous contrast. Sagittal and coronal MPR images reconstructed from axial data set. COMPARISON:  08/26/2017 FINDINGS: Brain: BILATERAL cochlear implants with associated beam hardening artifacts. Normal ventricular morphology. No midline shift or mass effect. Within limitations of beam hardening artifacts, no intracranial hemorrhage, mass lesion or evidence of acute infarction identified. No extra-axial fluid collections. Vascular: No hyperdense vessels Skull: Postoperative changes from cochlear implants. Otherwise unremarkable. Sinuses/Orbits: Clear Other: N/A IMPRESSION: Postoperative changes from BILATERAL cochlear implants. No acute intracranial abnormalities. Electronically Signed   By: Lavonia Dana M.D.   On: 07/10/2020 19:37  US RENAL  Result Date: 07/10/2020 CLINICAL DATA:  Acute kidney injury on chronic renal disease. EXAM: RENAL / URINARY TRACT ULTRASOUND COMPLETE COMPARISON:  CT abdomen pelvis 11/06/2019. FINDINGS: Right Kidney: Renal measurements: 9 x 4.7 x 4.2 cm = volume: 93 mL. Echogenicity is increased. No mass or hydronephrosis visualized. Left Kidney: Renal measurements: 10.1 x 4.3 x 5.6 cm = volume: 129 mL. Echogenicity is increased. No mass or hydronephrosis visualized. Urinary bladder: Appears normal for degree of bladder distention. Other: None. IMPRESSION: Increased echogenicity consistent with node renal parenchymal disease. Electronically Signed   By: Iven Finn M.D.   On: 07/10/2020 23:15         Scheduled Meds: . amLODipine  10 mg Oral Daily  . carvedilol  12.5 mg Oral BID WC  . Chlorhexidine Gluconate Cloth  6 each Topical Q0600  . enoxaparin (LOVENOX) injection  30 mg Subcutaneous Q24H  . gemfibrozil  300 mg Oral Q supper  . gemfibrozil  600 mg Oral QAC breakfast  . insulin aspart  0-9 Units Subcutaneous TID WC  . insulin glargine  30 Units Subcutaneous QHS  . levothyroxine  175 mcg Oral Daily  . mupirocin ointment  1 application Nasal BID  . pregabalin  150 mg Oral Daily  . sertraline  75 mg Oral Daily  . sodium zirconium cyclosilicate  5 g Oral Once    LOS: 0 days    Time spent: 35 minutes    Pratik Darleen Crocker, DO [facility]  If 7PM-7AM, please contact night-coverage [email or web address] 07/11/2020, 12:20 PM

## 2020-07-11 NOTE — ED Notes (Addendum)
Pt called nurse to room for help using the restroom. Pt unsteady on feet and needed a moderate amount of assistance reaching bedside commode. Bed linin changed as incontinence had occurred. Pt now clean, resting comfortably. VSS and still on 2L [place] for comfort. Will continue to monitor.

## 2020-07-11 NOTE — Plan of Care (Signed)
  Problem: Elimination: Goal: Will not experience complications related to bowel motility Outcome: Progressing   Problem: Elimination: Goal: Will not experience complications related to urinary retention Outcome: Progressing   

## 2020-07-11 NOTE — Progress Notes (Signed)
Paged provider patient on regular pain medication at home.  Received new orders

## 2020-07-11 NOTE — Progress Notes (Signed)
[facility] notified patient is c/o dizziness and Witness Nystagmus in left eye bp 104/65 HR 92. Arthor Captain LPN

## 2020-07-12 ENCOUNTER — Inpatient Hospital Stay (HOSPITAL_COMMUNITY): Payer: Medicare Other

## 2020-07-12 LAB — CBC
HCT: 29.7 % — ABNORMAL LOW (ref 36.0–46.0)
Hemoglobin: 10.2 g/dL — ABNORMAL LOW (ref 12.0–15.0)
MCH: 30.7 pg (ref 26.0–34.0)
MCHC: 34.3 g/dL (ref 30.0–36.0)
MCV: 89.5 fL (ref 80.0–100.0)
Platelets: 235 10*3/uL (ref 150–400)
RBC: 3.32 MIL/uL — ABNORMAL LOW (ref 3.87–5.11)
RDW: 14 % (ref 11.5–15.5)
WBC: 7.1 10*3/uL (ref 4.0–10.5)
nRBC: 0 % (ref 0.0–0.2)

## 2020-07-12 LAB — BASIC METABOLIC PANEL
Anion gap: 10 (ref 5–15)
BUN: 49 mg/dL — ABNORMAL HIGH (ref 6–20)
CO2: 20 mmol/L — ABNORMAL LOW (ref 22–32)
Calcium: 9.6 mg/dL (ref 8.9–10.3)
Chloride: 107 mmol/L (ref 98–111)
Creatinine, Ser: 2.64 mg/dL — ABNORMAL HIGH (ref 0.44–1.00)
GFR, Estimated: 23 mL/min — ABNORMAL LOW (ref >60)
Glucose, Bld: 154 mg/dL — ABNORMAL HIGH (ref 70–99)
Potassium: 4.4 mmol/L (ref 3.5–5.1)
Sodium: 137 mmol/L (ref 135–145)

## 2020-07-12 LAB — GLUCOSE, CAPILLARY
Glucose-Capillary: 73 mg/dL (ref 70–99)
Glucose-Capillary: 133 mg/dL — ABNORMAL HIGH (ref 70–99)
Glucose-Capillary: 148 mg/dL — ABNORMAL HIGH (ref 70–99)
Glucose-Capillary: 205 mg/dL — ABNORMAL HIGH (ref 70–99)

## 2020-07-12 LAB — MAGNESIUM: Magnesium: 2.1 mg/dL (ref 1.7–2.4)

## 2020-07-12 MED ORDER — PANTOPRAZOLE SODIUM 40 MG PO TBEC
40.0000 mg | DELAYED_RELEASE_TABLET | Freq: Every day | ORAL | Status: DC
  Administered 2020-07-12 – 2020-07-15 (×4): 40 mg via ORAL
  Filled 2020-07-12 (×4): qty 1

## 2020-07-12 MED ORDER — PROMETHAZINE HCL 25 MG PO TABS
25.0000 mg | ORAL_TABLET | Freq: Three times a day (TID) | ORAL | Status: DC | PRN
  Administered 2020-07-14: 25 mg via ORAL
  Filled 2020-07-12: qty 1

## 2020-07-12 MED ORDER — MECLIZINE HCL 25 MG PO TABS
25.0000 mg | ORAL_TABLET | Freq: Three times a day (TID) | ORAL | Status: DC | PRN
  Administered 2020-07-12 – 2020-07-14 (×2): 25 mg via ORAL
  Filled 2020-07-12 (×2): qty 1

## 2020-07-12 MED ORDER — LORATADINE 10 MG PO TABS
10.0000 mg | ORAL_TABLET | Freq: Every day | ORAL | Status: DC | PRN
  Administered 2020-07-12: 10 mg via ORAL
  Filled 2020-07-12: qty 1

## 2020-07-12 MED ORDER — HEPARIN SODIUM (PORCINE) 5000 UNIT/ML IJ SOLN
5000.0000 [IU] | Freq: Three times a day (TID) | INTRAMUSCULAR | Status: DC
  Administered 2020-07-12 – 2020-07-14 (×8): 5000 [IU] via SUBCUTANEOUS
  Filled 2020-07-12 (×9): qty 1

## 2020-07-12 MED ORDER — PROMETHAZINE HCL 25 MG PO TABS
25.0000 mg | ORAL_TABLET | Freq: Once | ORAL | Status: AC
  Administered 2020-07-12: 25 mg via ORAL
  Filled 2020-07-12: qty 1

## 2020-07-12 MED ORDER — ESTRADIOL 0.05 MG/24HR TD PTWK
0.0500 mg | MEDICATED_PATCH | TRANSDERMAL | Status: DC
  Administered 2020-07-12: 0.05 mg via TRANSDERMAL
  Filled 2020-07-12: qty 1

## 2020-07-12 MED ORDER — SENNOSIDES-DOCUSATE SODIUM 8.6-50 MG PO TABS
2.0000 | ORAL_TABLET | Freq: Two times a day (BID) | ORAL | Status: DC
  Administered 2020-07-12 – 2020-07-14 (×6): 2 via ORAL
  Filled 2020-07-12 (×6): qty 2

## 2020-07-12 MED ORDER — FLUTICASONE PROPIONATE 50 MCG/ACT NA SUSP
1.0000 | Freq: Every day | NASAL | Status: DC | PRN
  Administered 2020-07-12 – 2020-07-13 (×2): 1 via NASAL
  Filled 2020-07-12: qty 16

## 2020-07-12 NOTE — Progress Notes (Signed)
RT placed patient on overnight pulse ox. Patient on 2L [place].

## 2020-07-12 NOTE — Progress Notes (Addendum)
[facility] Progress Note  Patient: Brandi Love    [id number]  DOA: 07/10/2020     Date of Service: the patient was seen and examined on 07/12/2020  Brief hospital course: Past medical history of CKD 3B, type II DM, congenital hypothyroidism, bilateral sensorineural hearing loss with cochlear implant, HLD, OCD, depression and anxiety.  Presents with complaints of volume overload and worsening renal function Currently plan is monitor renal function.  Assessment and Plan: 1.  Acute kidney injury on CKD 3B Baseline serum creatinine around 1.8 On presentation serum creatinine 2.5. Currently roughly stable. Urine output reported by patient reducing for last 48 hours. Swelling improving. Monitor for now. Patient apparently had discussed with her primary nephrologist Dr. Justin Mend. Likely recurrent worsening is secondary to poor p.o. intake and will liberalize oral fluids for now. Will monitor renal function here in the hospital and if no improvement will discuss with nephrology here.  2.  Abdominal pain constipation Intractable nausea without vomiting. X-ray abdomen negative for any acute abnormality production patient has constipation.  Will initiate bowel regimen.  3.  Chest pain. Pleuritic in nature. X-ray negative EKG negative D-dimer negative. Continue pain control.  4.  Acute on chronic dizziness Patient reports severe dizziness which she reports as a vertiginous sensation. Unable to elicit any nystagmus but patient does have dizziness on exam looking for nystagmus. No focal deficit at the time of my evaluation. Will initiate Phenergan which is patient's chronic medication as well as increase the dose of the meclizine and monitor response.   Insulin-dependent type 2 diabetes: Place on reduced home Lantus 30 units nightly, sensitive SSI to moderate scale. Holding home Amaryl and Invokana for now. A1c 6.8%.  Hypothyroidism: Continue Synthroid.  Hypertension: Hold  amlodipine and Coreg for now and monitor blood pressures.  Hyperlipidemia/hypertriglyceridemia: Continue gemfibrozil.  Depression/anxiety/eating disorder/BPD: Continue sertraline.  Diet: Renal diet liberalized fluid DVT Prophylaxis:   heparin injection 5,000 Units Start: 07/12/20 0915    Advance goals of care discussion: Full code  Family Communication: no family was present at bedside, at the time of interview.   Disposition:  Status is: Inpatient  Remains inpatient appropriate because:IV treatments appropriate due to intensity of illness or inability to take PO   Dispo: The patient is from: Home              Anticipated d/c is to: Home              Patient currently is not medically stable to d/c.   Difficult to place patient No   Subjective: Reports nausea without any vomiting.  Reports poor p.o. intake.  Also has chest pain on taking a deep breath.  Also has abdominal pain which is located in the epigastric region on examination.  No diarrhea.  Reports constipation. Since the patient is unable to hear well the interview was conducted with mask removed.  Physical Exam:  General: Appear in mild distress, no Rash; Oral Mucosa Clear, moist. no Abnormal Neck Mass Or lumps, Conjunctiva normal  Cardiovascular: S1 and S2 Present, no Murmur, Respiratory: good respiratory effort, Bilateral Air entry present and CTA, no Crackles, no wheezes Abdomen: Bowel Sound present, Soft and mild tenderness Extremities: no Pedal edema Neurology: alert and oriented to time, place, and person affect appropriate. no new focal deficit Gait not checked due to patient safety concerns  Vitals:   07/11/20 2111 07/12/20 0507 07/12/20 0954 07/12/20 1702  BP: 130/82 120/77 118/77 125/76  Pulse: 95 93 94 100  Resp:  $'18 18 18 17  'u$ Temp: 98.4 F (36.9 C) 98.4 F (36.9 C) 98.2 F (36.8 C) 98.3 F (36.8 C)  TempSrc: Oral Oral Oral   SpO2: 97% 96% 96% 96%  Weight:        Intake/Output Summary  (Last 24 hours) at 07/12/2020 1823 Last data filed at 07/12/2020 1300 Gross per 24 hour  Intake 775 ml  Output 0 ml  Net 775 ml   Filed Weights   07/11/20 0330  Weight: 81.9 kg    Data Reviewed: I have personally reviewed and interpreted daily labs, tele strips, imaging. I reviewed all nursing notes, pharmacy notes, vitals, pertinent old records I have discussed plan of care as described above with RN and patient/family.  CBC: Recent Labs  Lab 07/10/20 1148 07/11/20 0404 07/12/20 0222  WBC 8.9 5.9 7.1  HGB 11.0* 10.3* 10.2*  HCT 33.0* 31.4* 29.7*  MCV 90.2 90.2 89.5  PLT 247 225 [id number]   Basic Metabolic Panel: Recent Labs  Lab 07/10/20 1148 07/11/20 0404 07/12/20 0222  NA 135 139 137  K 5.3* 4.7 4.4  CL 104 109 107  CO2 20* 19* 20*  GLUCOSE 204* 123* 154*  BUN 53* 49* 49*  CREATININE 2.53* 2.38* 2.64*  CALCIUM 10.6* 10.2 9.6  MG  --  2.1 2.1  PHOS  --  5.9*  --     Studies: DG Chest 1 View  Result Date: 07/12/2020 CLINICAL DATA:  Central chest pain, no known injury, initial encounter EXAM: CHEST  1 VIEW COMPARISON:  07/10/2020 FINDINGS: Cardiac shadow is stable. The lungs are well aerated bilaterally. Mild left basilar atelectasis is seen. No pneumothorax or effusion is noted. No bony abnormality is seen. IMPRESSION: Mild left basilar atelectasis. Electronically Signed   By: Inez Catalina M.D.   On: 07/12/2020 14:05   DG Abd 1 View  Result Date: 07/12/2020 CLINICAL DATA:  No bowel movements for 3 days EXAM: ABDOMEN - 1 VIEW COMPARISON:  None. FINDINGS: Scattered large and small bowel gas is noted. Some retained fecal material is noted primarily in the right colon. No obstructive changes are seen. No free air is noted. No bony abnormality is seen. IMPRESSION: Retained fecal material primarily in the right colon. Electronically Signed   By: Inez Catalina M.D.   On: 07/12/2020 14:04    Scheduled Meds: . allopurinol  300 mg Oral QHS  . amLODipine  10 mg Oral Daily  .  aspirin EC  81 mg Oral Daily  . carvedilol  12.5 mg Oral BID WC  . Chlorhexidine Gluconate Cloth  6 each Topical Q0600  . estradiol  0.05 mg Transdermal Weekly  . heparin injection (subcutaneous)  5,000 Units Subcutaneous Q8H  . insulin aspart  0-15 Units Subcutaneous TID WC  . insulin aspart  0-5 Units Subcutaneous QHS  . insulin glargine  30 Units Subcutaneous QHS  . levothyroxine  175 mcg Oral Daily  . lurasidone  20 mg Oral QHS  . melatonin  5 mg Oral QHS  . mupirocin ointment  1 application Nasal BID  . pantoprazole  40 mg Oral Daily  . pregabalin  150 mg Oral Daily  . senna-docusate  2 tablet Oral BID  . sertraline  75 mg Oral Daily   Continuous Infusions: PRN Meds: acetaminophen **OR** acetaminophen, albuterol, cyclobenzaprine, fluticasone, loratadine, meclizine, promethazine, traMADol, traZODone  Time spent: 35 minutes  Author: Berle Mull, MD [facility] 07/12/2020 6:23 PM  To reach On-call, see care teams to locate the attending  and reach out via [email or web address]. Between 7PM-7AM, please contact night-coverage If you still have difficulty reaching the attending provider, please page the [facility] (Director on Call) for [facility] on amion for assistance.

## 2020-07-13 LAB — RENAL FUNCTION PANEL
Albumin: 3.4 g/dL — ABNORMAL LOW (ref 3.5–5.0)
Anion gap: 10 (ref 5–15)
BUN: 49 mg/dL — ABNORMAL HIGH (ref 6–20)
CO2: 18 mmol/L — ABNORMAL LOW (ref 22–32)
Calcium: 9.1 mg/dL (ref 8.9–10.3)
Chloride: 109 mmol/L (ref 98–111)
Creatinine, Ser: 2.9 mg/dL — ABNORMAL HIGH (ref 0.44–1.00)
GFR, Estimated: 20 mL/min — ABNORMAL LOW (ref >60)
Glucose, Bld: 128 mg/dL — ABNORMAL HIGH (ref 70–99)
Phosphorus: 6.5 mg/dL — ABNORMAL HIGH (ref 2.5–4.6)
Potassium: 4.1 mmol/L (ref 3.5–5.1)
Sodium: 137 mmol/L (ref 135–145)

## 2020-07-13 LAB — OSMOLALITY, URINE: Osmolality, Ur: 389 mOsm/kg (ref 300–900)

## 2020-07-13 LAB — LIPID PANEL
Cholesterol: 193 mg/dL (ref 0–200)
HDL: 25 mg/dL — ABNORMAL LOW (ref >40)
LDL Cholesterol: UNDETERMINED mg/dL (ref 0–99)
Total CHOL/HDL Ratio: 7.7 RATIO
Triglycerides: 1083 mg/dL — ABNORMAL HIGH (ref <150)
VLDL: UNDETERMINED mg/dL (ref 0–40)

## 2020-07-13 LAB — PROTEIN / CREATININE RATIO, URINE
Creatinine, Urine: 66.57 mg/dL
Protein Creatinine Ratio: 0.48 mg/mg{Cre} — ABNORMAL HIGH (ref 0.00–0.15)
Total Protein, Urine: 32 mg/dL

## 2020-07-13 LAB — GLUCOSE, CAPILLARY
Glucose-Capillary: 134 mg/dL — ABNORMAL HIGH (ref 70–99)
Glucose-Capillary: 149 mg/dL — ABNORMAL HIGH (ref 70–99)
Glucose-Capillary: 145 mg/dL — ABNORMAL HIGH (ref 70–99)
Glucose-Capillary: 136 mg/dL — ABNORMAL HIGH (ref 70–99)

## 2020-07-13 LAB — URINALYSIS, COMPLETE (UACMP) WITH MICROSCOPIC
Bilirubin Urine: NEGATIVE
Glucose, UA: 50 mg/dL — AB
Hgb urine dipstick: NEGATIVE
Ketones, ur: NEGATIVE mg/dL
Leukocytes,Ua: NEGATIVE
Nitrite: NEGATIVE
Protein, ur: 30 mg/dL — AB
Specific Gravity, Urine: 1.011 (ref 1.005–1.030)
pH: 5 (ref 5.0–8.0)

## 2020-07-13 LAB — CBC
HCT: 30.4 % — ABNORMAL LOW (ref 36.0–46.0)
Hemoglobin: 10.4 g/dL — ABNORMAL LOW (ref 12.0–15.0)
MCH: 30.7 pg (ref 26.0–34.0)
MCHC: 34.2 g/dL (ref 30.0–36.0)
MCV: 89.7 fL (ref 80.0–100.0)
Platelets: 223 10*3/uL (ref 150–400)
RBC: 3.39 MIL/uL — ABNORMAL LOW (ref 3.87–5.11)
RDW: 14 % (ref 11.5–15.5)
WBC: 5.8 10*3/uL (ref 4.0–10.5)
nRBC: 0 % (ref 0.0–0.2)

## 2020-07-13 LAB — CREATININE, URINE, RANDOM: Creatinine, Urine: 67.64 mg/dL

## 2020-07-13 LAB — LDL CHOLESTEROL, DIRECT: Direct LDL: 46 mg/dL (ref 0–99)

## 2020-07-13 LAB — NA AND K (SODIUM & POTASSIUM), RAND UR
Potassium Urine: 34 mmol/L
Sodium, Ur: 52 mmol/L

## 2020-07-13 LAB — MAGNESIUM: Magnesium: 2.2 mg/dL (ref 1.7–2.4)

## 2020-07-13 LAB — OSMOLALITY: Osmolality: 315 mOsm/kg — ABNORMAL HIGH (ref 275–295)

## 2020-07-13 MED ORDER — CARVEDILOL 6.25 MG PO TABS
6.2500 mg | ORAL_TABLET | Freq: Two times a day (BID) | ORAL | Status: DC
  Administered 2020-07-13 – 2020-07-15 (×5): 6.25 mg via ORAL
  Filled 2020-07-13 (×5): qty 1

## 2020-07-13 MED ORDER — GEMFIBROZIL 600 MG PO TABS
300.0000 mg | ORAL_TABLET | Freq: Every day | ORAL | Status: DC
  Administered 2020-07-14 – 2020-07-15 (×2): 300 mg via ORAL
  Filled 2020-07-13 (×4): qty 0.5

## 2020-07-13 MED ORDER — AMLODIPINE BESYLATE 5 MG PO TABS
5.0000 mg | ORAL_TABLET | Freq: Every day | ORAL | Status: DC
Start: 2020-07-14 — End: 2020-07-16
  Administered 2020-07-14 – 2020-07-15 (×2): 5 mg via ORAL
  Filled 2020-07-13 (×2): qty 1

## 2020-07-13 MED ORDER — OXYCODONE HCL 5 MG PO TABS
5.0000 mg | ORAL_TABLET | ORAL | Status: DC | PRN
  Administered 2020-07-13 – 2020-07-14 (×3): 5 mg via ORAL
  Filled 2020-07-13 (×3): qty 1

## 2020-07-13 MED ORDER — GEMFIBROZIL 600 MG PO TABS
600.0000 mg | ORAL_TABLET | Freq: Every day | ORAL | Status: DC
  Administered 2020-07-14 – 2020-07-15 (×2): 600 mg via ORAL
  Filled 2020-07-13 (×2): qty 1

## 2020-07-13 MED ORDER — GEMFIBROZIL 600 MG PO TABS: 600.0000 mg | ORAL_TABLET | Freq: Two times a day (BID) | ORAL | Status: DC

## 2020-07-13 NOTE — Progress Notes (Signed)
[facility] Progress Note  Patient: Brandi Love    [id number]  DOA: 07/10/2020     Date of Service: the patient was seen and examined on 07/13/2020  Brief hospital course: Past medical history of CKD 3B, type II DM, congenital hypothyroidism, bilateral sensorineural hearing loss with cochlear implant, HLD, OCD, depression and anxiety.  Presents with complaints of volume overload and worsening renal function  Currently plan is monitor for improvement in renal function.  Assessment and Plan: 1.  Acute kidney injury on CKD 3B Baseline serum creatinine around 1.8 On presentation serum creatinine 2.5. Currently worsening. Reportedly presented with volume overload. Likely recurrent worsening is secondary to poor p.o. intake and will liberalize oral fluids for now. Nephrology consulted. We will monitor for further information and recommendation.  2.  Abdominal pain Constipation Intractable nausea without vomiting. X-ray abdomen negative for any acute abnormality  patient has constipation.  Will continue bowel regimen.  3.    Pleuritic chest pain. Pleuritic in nature. X-ray negative EKG negative  D-dimer negative. Continue pain control.  4.  Acute on chronic dizziness likely peripheral vertigo. Patient reports severe dizziness which she reports as a vertiginous sensation. Unable to elicit any nystagmus but patient does have dizziness on exam looking for nystagmus. No focal deficit at the time of my evaluation. Will initiate Phenergan which is patient's chronic medication as well as increase the dose of the meclizine and monitor response.  5. Insulin-dependent type 2 diabetes: Uncontrolled with hyperglycemia with renal complication with long-term insulin use. Place on reduced home Lantus 30 units nightly, sensitive SSIto moderate scale. Holding home Amaryl and Invokana for now.A1c 6.8%.  6. Hypothyroidism: Continue Synthroid.  7. Hypertension: Currently blood  pressure stable. On Norvasc 5 mg, Coreg 6.25 mg.  8. Hyperlipidemia/hypertriglyceridemia: Nephrology ordered lipid panel.  Triglyceride level is 1000.  Patient is on gemfibrozil which is currently on hold will resume it.  9. Depression/anxiety/eating disorder/BPD: Continue sertraline.  Diet: Carb modified diet DVT Prophylaxis:   heparin injection 5,000 Units Start: 07/12/20 0915    Advance goals of care discussion: Limited code  Family Communication: family was present at bedside, at the time of interview.  The pt provided permission to discuss medical plan with the family. Opportunity was given to ask question and all questions were answered satisfactorily.   Disposition:  Status is: Inpatient  Remains inpatient appropriate because:Ongoing diagnostic testing needed not appropriate for outpatient work up   Dispo: The patient is from: Home              Anticipated d/c is to: Home              Patient currently is not medically stable to d/c.   Difficult to place patient   Subjective: No nausea no vomiting but no fever no chills.  Intermittent pleuritic chest pain still present.  Epigastric abdominal pain still present.  No heartburn.  Passing gas but no BM.  Oral intake remains minimal.  Dizziness still present.  Unchanged.  Physical Exam:  General: Appear in mild distress, no Rash; Oral Mucosa Clear, moist. no Abnormal Neck Mass Or lumps, Conjunctiva normal  Cardiovascular: S1 and S2 Present, no Murmur, Respiratory: good respiratory effort, Bilateral Air entry present and faint Crackles, no wheezes Abdomen: Bowel Sound present, Soft and no tenderness Extremities: trace Pedal edema Neurology: alert and oriented to time, place, and person affect appropriate. no new focal deficit Gait not checked due to patient safety concerns  Vitals:   07/12/20 2114  07/13/20 0530 07/13/20 0852 07/13/20 1659  BP: 136/77 110/67 107/65 121/83  Pulse: 97 86 87 98  Resp: '16 18 14 18  '$ Temp:  98.8 F (37.1 C) 98.4 F (36.9 C) 98.9 F (37.2 C) 99 F (37.2 C)  TempSrc: Oral  Oral   SpO2: 95% 96% 96% 98%  Weight: 81.4 kg       Intake/Output Summary (Last 24 hours) at 07/13/2020 1859 Last data filed at 07/13/2020 1300 Gross per 24 hour  Intake 720 ml  Output 1650 ml  Net -930 ml   Filed Weights   07/11/20 0330 07/12/20 2114  Weight: 81.9 kg 81.4 kg    Data Reviewed: I have personally reviewed and interpreted daily labs, tele strips, imaging. I reviewed all nursing notes, pharmacy notes, vitals, pertinent old records I have discussed plan of care as described above with RN and patient/family.  CBC: Recent Labs  Lab 07/10/20 1148 07/11/20 0404 07/12/20 0222 07/13/20 0432  WBC 8.9 5.9 7.1 5.8  HGB 11.0* 10.3* 10.2* 10.4*  HCT 33.0* 31.4* 29.7* 30.4*  MCV 90.2 90.2 89.5 89.7  PLT 247 225 235 [id number]   Basic Metabolic Panel: Recent Labs  Lab 07/10/20 1148 07/11/20 0404 07/12/20 0222 07/13/20 0432  NA 135 139 137 137  K 5.3* 4.7 4.4 4.1  CL 104 109 107 109  CO2 20* 19* 20* 18*  GLUCOSE 204* 123* 154* 128*  BUN 53* 49* 49* 49*  CREATININE 2.53* 2.38* 2.64* 2.90*  CALCIUM 10.6* 10.2 9.6 9.1  MG  --  2.1 2.1 2.2  PHOS  --  5.9*  --  6.5*    Studies: No results found.  Scheduled Meds:  allopurinol  300 mg Oral QHS   [START ON 07/14/2020] amLODipine  5 mg Oral Daily   aspirin EC  81 mg Oral Daily   carvedilol  6.25 mg Oral BID WC   Chlorhexidine Gluconate Cloth  6 each Topical Q0600   estradiol  0.05 mg Transdermal Weekly   heparin injection (subcutaneous)  5,000 Units Subcutaneous Q8H   insulin aspart  0-15 Units Subcutaneous TID WC   insulin aspart  0-5 Units Subcutaneous QHS   insulin glargine  30 Units Subcutaneous QHS   levothyroxine  175 mcg Oral Daily   lurasidone  20 mg Oral QHS   melatonin  5 mg Oral QHS   mupirocin ointment  1 application Nasal BID   pantoprazole  40 mg Oral Daily   pregabalin  150 mg Oral Daily    senna-docusate  2 tablet Oral BID   sertraline  75 mg Oral Daily   Continuous Infusions: PRN Meds: acetaminophen **OR** acetaminophen, albuterol, cyclobenzaprine, fluticasone, loratadine, meclizine, oxyCODONE, promethazine, traZODone  Time spent: 35 minutes  Author: Berle Mull, MD [facility] 07/13/2020 6:59 PM  To reach On-call, see care teams to locate the attending and reach out via [email or web address]. Between 7PM-7AM, please contact night-coverage If you still have difficulty reaching the attending provider, please page the [facility] (Director on Call) for [facility] on amion for assistance.

## 2020-07-13 NOTE — Consult Note (Signed)
Nephrology Consult   Requesting provider: Dr. Posey Pronto Service requesting consult: [facility] Reason for consult: AKI  Assessment/Recommendations: Brandi Love is a/an 45 y.o. female with a past medical history notable for CKD Stage 3b, T2DM on insulin, congenital hypothyroidism, bilateral sensineural hearing loss s/p cochlear implants, HLD, familial hypertriglyceridemia, HTN, eating disorder who is currently admitted for AKI.  1. Non-Oliguric/Anuric AKI: Creatinine on admission had been stable compared to her creatinine this past 3 months.  However increased today to 3.2. Etiology at this time undetermined.  Given blood pressure is on the lower side of normal, will decrease the dose of Amlodipine and Coreg to optimize renal perfusion. Renal ultrasound obtained; shows medical renal disease.   -Decreased Amlodipine to 5 mg daily -Decreased Coreg to 6.25 mg daily  -Urine studies pending -Continue to monitor daily Cr, Dose meds for GFR<15 -Monitor Daily I/Os, Daily weight  -Maintain MAP>65 for optimal renal perfusion.  -Avoid further nephrotoxins including NSAIDS, Morphine. Unless absolutely necessary, avoid CT with contrast and/or MRI with gadolinium.     2. CKD Stage 3b: Follows with Dr. Alcide Evener kidney; CKD thought to be secondary to nephrosclerosis.  Recommend follow-up on discharge.  3. Type 2 Diabetes Mellitus: Follows with endocrinology at [facility].  Recently had changes to her insulin regimen, particularly her morning insulin was discontinued.  A1c at this visit was 6.8%.  CBGs roughly within goal on SSI and Lantus.  -Management per primary  4. HTN: Well-controlled on current regimen which includes Coreg and amlodipine.  Given blood pressures on the lower side in the setting of AKI, will decrease dosage and monitor closely.  -Decreased Amlodipine to 5 mg daily -Decreased Coreg to 6.25 mg daily   5. Dizziness: Concerning for BPV versus 2/2 to polypharmacy vs orthostasis.    -Recommend renal dosing of Lyrica and Tramadol -Orthostatics pending  6. Generalized Edema: Patient describes a 1 month history of facial edema with truncal edema that eventually did spread to the extremities.  This may be an indication of worsening proteinuria, however given it started in her face and patient does have some cushingoid features, will check a cortisol in the a.m.  - AM Cortisol pending   7. Congenital Hypothyroidism:  -Well controlled on Synthroid   Recommendations conveyed to primary service.   Dr. Jose Persia Internal Medicine PGY-2   07/13/2020, 10:51 AM ____________________________________________________________________________   History of Present Illness: Brandi Love is a/an 45 y.o. female with a past medical history of CKD Stage 3b, T2DM on insulin, congenital hypothyroidism, bilateral sensineural hearing loss s/p cochlear implants, HLD, familial hypertriglyceridemia, HTN, eating disorder who presents to [facility] with SOB.  Brandi Love states that she began developing shortness of breath with exertion approximately 3 months ago.  Approximately 1 month ago, she began having facial swelling, upper extremity swelling and abdominal swelling that eventually did spread to lower extremity swelling.  Swelling would be intermittent but over the past week, has become persistent.  She endorses dizziness worse with standing and laying down for the last 1 week.  Dizziness is only alleviated by sitting up.  She had followed up with her PCP for these complaints, who recommended patient follow-up with cardiology. Patient followed up with Dr. Nadyne Coombes (cardiology) on the 21st and was referred to the ED for further evaluation due to unsteady gait additionally to concern for PE.   Patient is established at [facility]; primary nephrologist Dr. Justin Mend. CKD felt to be likely secondary to nephrosclerosis. Baseline creatinine during 2020-2021 was  between 1.4 - 1.8. Dr. Justin Mend noted a few acute  increases to the low 2 recently between October and December 2021. Creatinine on 06/21/20 improved to 1.86. Labs on 07/07/20 prior to arrival with creatinine of 2.80, BUN of 63 and potassium of 5.5.   Recent iron studies (07/07/20) with evidence of IDA (TIBC of 474, iron saturation of 11%).   Medications:  Current Facility-Administered Medications  Medication Dose Route Frequency Provider Last Rate Last Admin   acetaminophen (TYLENOL) tablet 650 mg  650 mg Oral Q6H PRN Lenore Cordia, MD   650 mg at 07/12/20 2202   Or   acetaminophen (TYLENOL) suppository 650 mg  650 mg Rectal Q6H PRN Lenore Cordia, MD       albuterol (VENTOLIN HFA) 108 (90 Base) MCG/ACT inhaler 1-2 puff  1-2 puff Inhalation Q6H PRN Manuella Ghazi, Pratik D, DO   2 puff at 07/12/20 1746   allopurinol (ZYLOPRIM) tablet 300 mg  300 mg Oral QHS Shah, Pratik D, DO   300 mg at 07/12/20 2202   amLODipine (NORVASC) tablet 10 mg  10 mg Oral Daily Lenore Cordia, MD   10 mg at 07/12/20 [id number]   aspirin EC tablet 81 mg  81 mg Oral Daily Heath Lark D, DO   81 mg at 07/12/20 0854   carvedilol (COREG) tablet 12.5 mg  12.5 mg Oral BID WC Zada Finders R, MD   12.5 mg at 07/12/20 1739   Chlorhexidine Gluconate Cloth 2 % PADS 6 each  6 each Topical Q0600 Heath Lark D, DO   6 each at 07/11/20 1600   cyclobenzaprine (FLEXERIL) tablet 5 mg  5 mg Oral TID PRN Heath Lark D, DO   5 mg at 07/12/20 2202   estradiol (CLIMARA - Dosed in mg/24 hr) patch 0.05 mg  0.05 mg Transdermal Weekly Lavina Hamman, MD   0.05 mg at 07/12/20 1302   fluticasone (FLONASE) 50 MCG/ACT nasal spray 1 spray  1 spray Each Nare Daily PRN Lavina Hamman, MD   1 spray at 07/12/20 1302   heparin injection 5,000 Units  5,000 Units Subcutaneous Q8H Lavina Hamman, MD   5,000 Units at 07/13/20 [id number]   insulin aspart (novoLOG) injection 0-15 Units  0-15 Units Subcutaneous TID WC Shah, Pratik D, DO   2 Units at 07/12/20 1739   insulin aspart (novoLOG) injection 0-5 Units   0-5 Units Subcutaneous QHS Manuella Ghazi, Pratik D, DO   2 Units at 07/12/20 2212   insulin glargine (LANTUS) injection 30 Units  30 Units Subcutaneous QHS Lenore Cordia, MD   30 Units at 07/12/20 2202   levothyroxine (SYNTHROID) tablet 175 mcg  175 mcg Oral Daily Lenore Cordia, MD   175 mcg at 07/13/20 [id number]   loratadine (CLARITIN) tablet 10 mg  10 mg Oral Daily PRN Heath Lark D, DO   10 mg at 07/12/20 0534   lurasidone (LATUDA) tablet 20 mg  20 mg Oral QHS Shah, Pratik D, DO   20 mg at 07/12/20 2202   meclizine (ANTIVERT) tablet 25 mg  25 mg Oral TID PRN Lavina Hamman, MD   25 mg at 07/12/20 1935   melatonin tablet 5 mg  5 mg Oral QHS Mansy, Jan A, MD   5 mg at 07/12/20 2202   mupirocin ointment (BACTROBAN) 2 % 1 application  1 application Nasal BID Heath Lark D, DO   1 application at [id number]   pantoprazole (PROTONIX) EC tablet 40 mg  40 mg Oral Daily Lavina Hamman, MD   40 mg at 07/12/20 1258   pregabalin (LYRICA) capsule 150 mg  150 mg Oral Daily Couture, Cortni S, PA-C   150 mg at 07/12/20 [id number]   promethazine (PHENERGAN) tablet 25 mg  25 mg Oral Q8H PRN Lavina Hamman, MD       senna-docusate (Senokot-S) tablet 2 tablet  2 tablet Oral BID Lavina Hamman, MD   2 tablet at 07/12/20 2202   sertraline (ZOLOFT) tablet 75 mg  75 mg Oral Daily Lenore Cordia, MD   75 mg at 07/12/20 [id number]   traMADol (ULTRAM) tablet 100 mg  100 mg Oral Q8H PRN Heath Lark D, DO   100 mg at 07/13/20 [id number]   traZODone (DESYREL) tablet 25 mg  25 mg Oral QHS PRN Mansy, Arvella Merles, MD       ALLERGIES Love oil, Iodinated diagnostic agents, Other, Phenylephrine-guaifenesin, Propoxyphene, Hydromorphone hcl, Abilify [aripiprazole], Bee venom, Dhea [nutritional supplements], Dihydroergotamine, Divalproex sodium, Doxycycline, Erythromycin, Indomethacin, Lactose intolerance (gi), Risperidone and related, Shellfish allergy, Statins, Tape, Dihydroergotamine, Lactose intolerance (gi), Nsaids, Sulfa antibiotics, and  Tape  MEDICAL HISTORY Past Medical History:  Diagnosis Date   Anemia    Anorexia    Anxiety    Arthritis    "hands" (09/18/2016)   Chronic kidney disease    Chronic lower back pain    Chronic renal insufficiency    Deaf    Deafness    Depression    Diabetes ([place])    "borderline"   Eating disorder    Endometriosis    GERD (gastroesophageal reflux disease)    History of kidney stones    Hyperlipidemia    Hypothyroidism    "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  Tobacco Use   Smoking status: Never Smoker   Smokeless tobacco: Never Used  Vaping Use   Vaping Use: Never used  Substance and Sexual Activity   Alcohol use: Never   Drug use: Never   Sexual activity: Never    Birth control/protection: Surgical  Other Topics Concern   Not on file  Social History Narrative   ** Merged History Encounter **       Lives alone.  Single.  No family in town.     Social Determinants of Health   Financial Resource Strain: Not on file  Food Insecurity: Not on file  Transportation Needs: Not on file  Physical Activity: Not on file  Stress: Not on file  Social Connections: Not on file  Intimate Partner Violence: Not on file    FAMILY HISTORY Family History  Problem Relation Age of Onset   Cancer Father    Hyperlipidemia Father    Osteoarthritis Mother     Hyperlipidemia Mother     No family history of  kidney disease  Review of Systems: 12 systems reviewed Otherwise as per HPI, all other systems reviewed and negative  Physical Exam: Vitals:   07/13/20 0530 07/13/20 0852  BP: 110/67 107/65  Pulse: 86 87  Resp: 18 14  Temp: 98.4 F (36.9 C) 98.9 F (37.2 C)  SpO2: 96% 96%   Total I/O In: -  Out: 1050 [Urine:1050]  Intake/Output Summary (Last 24 hours) at 07/13/2020 1050 Last data filed at 07/13/2020 0945 Gross per 24 hour  Intake 540 ml  Output 1650 ml  Net -1110 ml   General: well-appearing, no acute distress HEENT: anicteric sclera, oropharynx clear without lesions CV: regular rate, normal rhythm, no murmurs, no gallops, no rubs, no peripheral edema Lungs: minimal expiratory wheezing bibasilarly, normal work of breathing Abd: soft, mild tenderness to palpation in epigastric region, non-distended Skin: no visible lesions or rashes Psych: alert, engaged, appropriate mood and affect Musculoskeletal: subcutaneous dorsocervical fat pad present. No other obvious abnormalities  Neuro: normal speech, moving all extremities without difficulty.   Test Results Reviewed Lab Results  Component Value Date   NA 137 07/13/2020   K 4.1 07/13/2020   CL 109 07/13/2020   CO2 18 (L) 07/13/2020   BUN 49 (H) 07/13/2020   CREATININE 2.90 (H) 07/13/2020   CALCIUM 9.1 07/13/2020   ALBUMIN 3.4 (L) 07/13/2020   PHOS 6.5 (H) 07/13/2020   I have reviewed all relevant outside healthcare records related to the patient's kidney injury.

## 2020-07-14 LAB — RENAL FUNCTION PANEL
Albumin: 3.6 g/dL (ref 3.5–5.0)
Anion gap: 11 (ref 5–15)
BUN: 53 mg/dL — ABNORMAL HIGH (ref 6–20)
CO2: 20 mmol/L — ABNORMAL LOW (ref 22–32)
Calcium: 9.6 mg/dL (ref 8.9–10.3)
Chloride: 106 mmol/L (ref 98–111)
Creatinine, Ser: 2.79 mg/dL — ABNORMAL HIGH (ref 0.44–1.00)
GFR, Estimated: 21 mL/min — ABNORMAL LOW (ref >60)
Glucose, Bld: 166 mg/dL — ABNORMAL HIGH (ref 70–99)
Phosphorus: 4.9 mg/dL — ABNORMAL HIGH (ref 2.5–4.6)
Potassium: 4.1 mmol/L (ref 3.5–5.1)
Sodium: 137 mmol/L (ref 135–145)

## 2020-07-14 LAB — CBC
HCT: 30.7 % — ABNORMAL LOW (ref 36.0–46.0)
Hemoglobin: 10.5 g/dL — ABNORMAL LOW (ref 12.0–15.0)
MCH: 30.7 pg (ref 26.0–34.0)
MCHC: 34.2 g/dL (ref 30.0–36.0)
MCV: 89.8 fL (ref 80.0–100.0)
Platelets: 251 10*3/uL (ref 150–400)
RBC: 3.42 MIL/uL — ABNORMAL LOW (ref 3.87–5.11)
RDW: 14 % (ref 11.5–15.5)
WBC: 8.1 10*3/uL (ref 4.0–10.5)
nRBC: 0 % (ref 0.0–0.2)

## 2020-07-14 LAB — CORTISOL-AM, BLOOD: Cortisol - AM: 8.3 ug/dL (ref 6.7–22.6)

## 2020-07-14 LAB — MAGNESIUM: Magnesium: 2.1 mg/dL (ref 1.7–2.4)

## 2020-07-14 LAB — GLUCOSE, CAPILLARY
Glucose-Capillary: 142 mg/dL — ABNORMAL HIGH (ref 70–99)
Glucose-Capillary: 206 mg/dL — ABNORMAL HIGH (ref 70–99)
Glucose-Capillary: 154 mg/dL — ABNORMAL HIGH (ref 70–99)
Glucose-Capillary: 191 mg/dL — ABNORMAL HIGH (ref 70–99)

## 2020-07-14 MED ORDER — POLYETHYLENE GLYCOL 3350 17 G PO PACK
17.0000 g | PACK | Freq: Every day | ORAL | Status: DC
  Administered 2020-07-14 – 2020-07-15 (×2): 17 g via ORAL
  Filled 2020-07-14 (×2): qty 1

## 2020-07-14 MED ORDER — HYDROCODONE-ACETAMINOPHEN 5-325 MG PO TABS
1.0000 | ORAL_TABLET | Freq: Four times a day (QID) | ORAL | Status: DC | PRN
Start: 2020-07-14 — End: 2020-07-16
  Administered 2020-07-14 – 2020-07-15 (×4): 1 via ORAL
  Filled 2020-07-14 (×5): qty 1

## 2020-07-14 NOTE — Progress Notes (Signed)
[facility] Progress Note  Patient: Brandi Love    [id number]  DOA: 07/10/2020     Date of Service: the patient was seen and examined on 07/14/2020  Brief hospital course: Past medical history of CKD 3B, type II DM, congenital hypothyroidism, bilateral sensorineural hearing loss with cochlear implant, HLD, OCD, depression and anxiety. Presents with complaints of volume overload and worsening renal function  Currently plan is monitor for improvement in renal function.  Assessment and Plan: 1.  Acute kidney injury on CKD 3B Baseline serum creatinine around 1.8 On presentation serum creatinine 2.5. Finally improving. Reportedly presented with volume overload. Likely recurrent worsening is secondary to poor p.o. intake and will liberalize oral fluids for now. Nephrology consulted. Appreciate assistance.  Monitor.  2.  Abdominal pain Constipation Intractable nausea without vomiting. X-ray abdomen negative for any acute abnormality  patient has constipation.  Will continue bowel regimen.  3.    Pleuritic chest pain. Shortness of breath Pleuritic in nature. X-ray negative EKG negative  D-dimer negative. Continue pain control. Patient continues to report shortness of breath although EKG x2 unremarkable for any acute ischemia.  Chest x-ray x2 - for any acute abnormality.  Clinical exam also unremarkable as well.  Overnight oximetry shows normal saturation.  4.  Acute on chronic dizziness likely peripheral vertigo. Patient reports severe dizziness which she reports as a vertiginous sensation. Unable to elicit any nystagmus but patient does have dizziness on exam looking for nystagmus. No focal deficit at the time of my evaluation. Continue Phenergan.  Continue meclizine.  Vestibular rehab consultation.  5. Insulin-dependent type 2 diabetes: Uncontrolled with hyperglycemia with renal complication with long-term insulin use. Place on reduced home Lantus 30 units nightly,  sensitive SSIto moderate scale. Holding home Amaryl and Invokana for now.A1c 6.8%.  6. Hypothyroidism: Continue Synthroid.  7. Hypertension: Currently blood pressure stable. On Norvasc 5 mg, Coreg 6.25 mg.  8. Hyperlipidemia/hypertriglyceridemia: Nephrology ordered lipid panel.  Triglyceride level is 1000.  Patient is on gemfibrozil which is currently on hold will resume it.  9. Depression/anxiety/eating disorder/BPD: Continue sertraline.  Diet: Carb modified diet DVT Prophylaxis:   heparin injection 5,000 Units Start: 07/12/20 0915  Advance goals of care discussion: Limited code  Family Communication: family was present on phone. at the time of interview.  The pt provided permission to discuss medical plan with the family. Opportunity was given to ask question and all questions were answered satisfactorily.   Disposition:  Status is: Inpatient  Remains inpatient appropriate because:Ongoing diagnostic testing needed not appropriate for outpatient work up  Dispo: The patient is from: Home              Anticipated d/c is to: Home              Patient currently is not medically stable to d/c.   Difficult to place patient   Subjective: Continues to have dizziness continues to have chest pain.  No nausea no vomiting but no fever no chills.  Physical Exam:  General: Appear in mild distress, no Rash; Oral Mucosa Clear, moist. no Abnormal Neck Mass Or lumps, Conjunctiva normal  Cardiovascular: S1 and S2 Present, no Murmur, Respiratory: good respiratory effort, Bilateral Air entry present and CTA, no Crackles, no wheezes Abdomen: Bowel Sound present, Soft and no tenderness Extremities: no Pedal edema Neurology: alert and oriented to time, place, and person affect appropriate. no new focal deficit Gait not checked due to patient safety concerns  Vitals:   07/13/20 2008 07/14/20  [id number] 07/14/20 0914 07/14/20 1811  BP: 132/87 118/82 116/70 124/78  Pulse: 92 (!) 102 (!) 103  97  Resp: '18 18 18 18  '$ Temp: 98.5 F (36.9 C) 98.9 F (37.2 C) 98.6 F (37 C) 98.1 F (36.7 C)  TempSrc: Oral Oral Oral Oral  SpO2: 98% 93% 98% 100%  Weight:  78.7 kg      Intake/Output Summary (Last 24 hours) at 07/14/2020 1923 Last data filed at 07/14/2020 1700 Gross per 24 hour  Intake 780 ml  Output --  Net 780 ml   Filed Weights   07/11/20 0330 07/12/20 2114 07/14/20 0621  Weight: 81.9 kg 81.4 kg 78.7 kg    Data Reviewed: I have personally reviewed and interpreted daily labs, tele strips, imaging. I reviewed all nursing notes, pharmacy notes, vitals, pertinent old records I have discussed plan of care as described above with RN and patient/family.  CBC: Recent Labs  Lab 07/10/20 1148 07/11/20 0404 07/12/20 0222 07/13/20 0432 07/14/20 0542  WBC 8.9 5.9 7.1 5.8 8.1  HGB 11.0* 10.3* 10.2* 10.4* 10.5*  HCT 33.0* 31.4* 29.7* 30.4* 30.7*  MCV 90.2 90.2 89.5 89.7 89.8  PLT 247 225 235 223 [id number]   Basic Metabolic Panel: Recent Labs  Lab 07/10/20 1148 07/11/20 0404 07/12/20 0222 07/13/20 0432 07/14/20 0542  NA 135 139 137 137 137  K 5.3* 4.7 4.4 4.1 4.1  CL 104 109 107 109 106  CO2 20* 19* 20* 18* 20*  GLUCOSE 204* 123* 154* 128* 166*  BUN 53* 49* 49* 49* 53*  CREATININE 2.53* 2.38* 2.64* 2.90* 2.79*  CALCIUM 10.6* 10.2 9.6 9.1 9.6  MG  --  2.1 2.1 2.2 2.1  PHOS  --  5.9*  --  6.5* 4.9*    Studies: No results found.  Scheduled Meds: . allopurinol  300 mg Oral QHS  . amLODipine  5 mg Oral Daily  . aspirin EC  81 mg Oral Daily  . carvedilol  6.25 mg Oral BID WC  . Chlorhexidine Gluconate Cloth  6 each Topical Q0600  . estradiol  0.05 mg Transdermal Weekly  . gemfibrozil  300 mg Oral QAC breakfast  . gemfibrozil  600 mg Oral QAC supper  . heparin injection (subcutaneous)  5,000 Units Subcutaneous Q8H  . insulin aspart  0-15 Units Subcutaneous TID WC  . insulin aspart  0-5 Units Subcutaneous QHS  . insulin glargine  30 Units Subcutaneous QHS  .  levothyroxine  175 mcg Oral Daily  . lurasidone  20 mg Oral QHS  . melatonin  5 mg Oral QHS  . mupirocin ointment  1 application Nasal BID  . pantoprazole  40 mg Oral Daily  . polyethylene glycol  17 g Oral Daily  . pregabalin  150 mg Oral Daily  . senna-docusate  2 tablet Oral BID  . sertraline  75 mg Oral Daily   Continuous Infusions: PRN Meds: acetaminophen **OR** acetaminophen, albuterol, cyclobenzaprine, fluticasone, HYDROcodone-acetaminophen, loratadine, meclizine, promethazine, traZODone  Time spent: 35 minutes  Author: Berle Mull, MD [facility] 07/14/2020 7:23 PM  To reach On-call, see care teams to locate the attending and reach out via [email or web address]. Between 7PM-7AM, please contact night-coverage If you still have difficulty reaching the attending provider, please page the [facility] (Director on Call) for [facility] on amion for assistance.

## 2020-07-14 NOTE — Evaluation (Signed)
Physical Therapy Evaluation Patient Details Name: Brandi Love MRN: [id number] DOB: Dec 01, 1975 Today's Date: 07/14/2020   History of Present Illness  Brandi Love is a 45 y/o female who reported to ED with complaints of chest pain, SOB, dizziness, and B LE swelling. Pt was admitted on 07/10/20 and diagnosed with acute renal failure. PMH includes deafness with cochlear implants, anemia, anorexia, DM, GERD, hypothyroidism, seizures, fibromyagia, tachycardia, surgery to correct a "lazy eye" in 1980s, and benign lumpectomy.    Clinical Impression  Pt received in bed, cooperative and pleasant. Reports constant dizziness that is exacerbated by movement. Pt demonstrates symptoms consistent with potential central involvement. Difficulty ruling out peripheral involvement due to difficulty getting into position for canalith testing. Difficulty with gaze stability and suspect pt will benefit from gaze stability exercises. Mobility was limited by dizziness and chest pain, but pt generally min A for transfers. Unclear if chest pain, SOB, or anxiety-related. Pt would benefit from PT to address vestibular involvement, balance, endurance, and decrease fall risk. Pt left in chair with all needs met, call bell within reach, and RN aware of status. Will continue to follow acutely.    Follow Up Recommendations CIR;Supervision for mobility/OOB    Equipment Recommendations  3in1 (PT)    Recommendations for Other Services       Precautions / Restrictions Precautions Precautions: Fall Precaution Comments: deaf (has cochlear implants, reads lips well), dizziness, monitor O2 Restrictions Weight Bearing Restrictions: No      Mobility  Bed Mobility Overal bed mobility: Needs Assistance Bed Mobility: Supine to Sit     Supine to sit: HOB elevated;Supervision     General bed mobility comments: No phyiscal assist give, use of bed rails, HOB elevated    Transfers Overall transfer level: Needs  assistance Equipment used: Rolling walker (2 wheeled) Transfers: Sit to/from Stand Sit to Stand: Min assist         General transfer comment: Min A for power up into standing and for steadying  Ambulation/Gait             General Gait Details: deferred due to chest pain and dizziness  Stairs            Wheelchair Mobility    Modified Rankin (Stroke Patients Only)       Balance Overall balance assessment: Needs assistance   Sitting balance-Leahy Scale: Fair       Standing balance-Leahy Scale: Poor Standing balance comment: Reliant on UE support                             Pertinent Vitals/Pain Pain Assessment: 0-10 Pain Score: 7  Pain Location: dizziness Pain Descriptors / Indicators: Constant;Discomfort Pain Intervention(s): Monitored during session;Repositioned    Home Living Family/patient expects to be discharged to:: Private residence Living Arrangements: Alone Available Help at Discharge: Family;Available 24 hours/day (only able to help for a few days) Type of Home: Apartment Home Access: Stairs to enter Entrance Stairs-Rails: Left;Right Entrance Stairs-Number of Steps: 20 Home Layout: One level Home Equipment: Cane - single point;Walker - 2 wheels      Prior Function Level of Independence: Independent with assistive device(s)               Hand Dominance        Extremity/Trunk Assessment   Upper Extremity Assessment Upper Extremity Assessment: Overall WFL for tasks assessed    Lower Extremity Assessment Lower Extremity Assessment: Overall WFL for tasks assessed  Cervical / Trunk Assessment Cervical / Trunk Assessment: Kyphotic  Communication   Communication: Deaf  Cognition Arousal/Alertness: Awake/alert Behavior During Therapy: WFL for tasks assessed/performed Overall Cognitive Status: Within Functional Limits for tasks assessed                                 General Comments: A&Ox4.  Responds appropriately to questions and follows commands consistently. No family present to determine baseline. Unclear if pt had difficulty with memory, unable to recall what she has done with PT in the past. Stated that she thought she was going home today even though there was no evidence of pt being discharged today.       General Comments  Vestibular testing Smooth pursuits: smooth eye movement, no nystagmus at end range, reported increase in symptoms and needed cueing to keep eyes open  Saccades: saccadic intrusions, lag moving left to/from right, increase in symptoms  Convergence: approximately < 10cm before letter "A" started to double and pt became symptomatic  VOR cancellation: unable to tolerate, symptomatic, difficulty maintaining eyes on target  VORx1 / gaze stability: symptomatic, slow self-selected speed, difficulty maintaining eyes on target, able to tolerate for 15-20 seconds before needing to stop  Right head impulse test: Unable to maintain eyes on PT's nose, very symptomatic to the point where unable to test L side  Modified [place]: no evidence of nystagmus on L or R side, but limited due to pt unable to get into 30 degrees cervical extension, increase in symptoms  Pt symptomatic (dizziness and minimal nausea) with all movement. Most symptomatic with head impulse test, saccades, and gaze stability  Orthostatic hypotension: no evidence or significant findings      Exercises  Instructed pt to complete VORx1 for 20-30 seconds at self selected speeded, horizontal and vertical, with large letter "A" -Educated that dizziness following exercise is normal, but should not last for longer than ~30 minutes   Assessment/Plan    PT Assessment Patient needs continued PT services  PT Problem List Decreased strength;Decreased cognition;Decreased knowledge of use of DME;Decreased activity tolerance;Decreased balance;Decreased mobility;Decreased coordination       PT  Treatment Interventions DME instruction;Balance training;Gait training;Neuromuscular re-education;Stair training;Functional mobility training;Cognitive remediation;Therapeutic activities;Therapeutic exercise;Patient/family education    PT Goals (Current goals can be found in the Care Plan section)  Acute Rehab PT Goals Patient Stated Goal: breathe easier, go home PT Goal Formulation: With patient Time For Goal Achievement: 07/28/20 Potential to Achieve Goals: Good    Frequency Min 3X/week   Barriers to discharge        Co-evaluation               AM-PAC PT "6 Clicks" Mobility  Outcome Measure Help needed turning from your back to your side while in a flat bed without using bedrails?: A Little Help needed moving from lying on your back to sitting on the side of a flat bed without using bedrails?: A Little Help needed moving to and from a bed to a chair (including a wheelchair)?: A Little Help needed standing up from a chair using your arms (e.g., wheelchair or bedside chair)?: A Little Help needed to walk in hospital room?: A Lot Help needed climbing 3-5 steps with a railing? : Total 6 Click Score: 15    End of Session Equipment Utilized During Treatment: Gait belt Activity Tolerance: Other (comment) (Limited by dizziness and chest pain) Patient left: in chair;with call  bell/phone within reach Nurse Communication: Mobility status PT Visit Diagnosis: Unsteadiness on feet (R26.81);Dizziness and giddiness (R42)    Time:  -      Charges:         Rosita Kea, SPT

## 2020-07-14 NOTE — Care Management Important Message (Signed)
Important Message  Patient Details  Name: SABIRAH PIPPERT MRN: [id number] Date of Birth: 23-Sep-1975   Medicare Important Message Given:  Yes - Important Message mailed due to current National Emergency  \ Verbal consent obtained due to current National Emergency  Relationship to patient: Mother Contact Name: Neoma Laming Solorio Call Date: 07/14/20  Time: 1307 Phone: [phone] Outcome: Spoke with contact Important Message mailed to: Patient address on file    Delorse Lek 07/14/2020, 1:07 PM

## 2020-07-14 NOTE — Discharge Instructions (Signed)
[facility]/ED RD: [phone]

## 2020-07-14 NOTE — Plan of Care (Signed)
  Problem: Education: Goal: Knowledge of General Education information will improve Description Including pain rating scale, medication(s)/side effects and non-pharmacologic comfort measures Outcome: Progressing   

## 2020-07-14 NOTE — Progress Notes (Signed)
[facility] Progress Note    Assessment/ Plan:    Assessment/Recommendations: Brandi Love is a/an 45 y.o. female with a past medical history notable for CKD Stage 3b, T2DM on insulin, congenital hypothyroidism, bilateral sensineural hearing loss s/p cochlear implants, HLD, familial hypertriglyceridemia, HTN, eating disorder who is currently admitted for AKI.  1. Non-Oliguric/Anuric AKI: Creatinine on admission had been stable compared to her creatinine this past 3 months.  Increased to 3.2 yesterday, but now back down to 2.79. Etiology at this time undetermined, possibly a component of hypotension. Proteinuria improved compared to previous results. No further intervention indicated at this time.    -Outpatient follow up with Dr. Justin Mend   -Maintain MAP>65 for optimal renal perfusion.  -Avoid further nephrotoxins including NSAIDS, Morphine. Unless absolutely necessary, avoid CT with contrast and/or MRI with gadolinium.     2. CKD Stage 3b: Follows with Dr. Alcide Evener kidney; CKD thought to be secondary to nephrosclerosis.  Recommend follow-up on discharge.  3. Type 2 Diabetes Mellitus: Follows with endocrinology at [facility].  Recently had changes to her insulin regimen, particularly her morning insulin was discontinued.  A1c at this visit was 6.8%.  CBGs roughly within goal on SSI and Lantus.  -Management per primary  4. HTN: Well-controlled on current regimen which includes Coreg and amlodipine. jBP doses adjusted yesterday and BP marginally higher today.   -Continue Amlodipine to 5 mg daily -Continue Coreg to 6.25 mg daily   5. Dizziness: Concerning for BPV versus 2/2 to polypharmacy vs orthostasis.   -Recommend renal dosing of Lyrica and Tramadol  6. Generalized Edema: Patient describes a 1 month history of facial edema with truncal edema that eventually did spread to the extremities.  This may be an indication of worsening proteinuria, however given it  started in her face and patient does have some cushingoid features. AM cortisol was within normal range at this time. Recommend follow up with Endocrinology.   7. Congenital Hypothyroidism:  -Well controlled on Synthroid   Subjective:    Brandi Love state she continues to have dizziness at rest but denies any additional swelling. She is looking forward to working with PT.    Objective:   BP 116/70 (BP Location: Right Arm)   Pulse (!) 103   Temp 98.6 F (37 C) (Oral)   Resp 18   Wt 78.7 kg   LMP  (LMP Unknown)   SpO2 98%   BMI 26.38 kg/m   Intake/Output Summary (Last 24 hours) at 07/14/2020 [id number] Last data filed at 07/13/2020 1300 Gross per 24 hour  Intake 480 ml  Output 1050 ml  Net -570 ml   Weight change: -2.7 kg  Physical Exam: General: well-appearing, no acute distress CV:no edema of the extremities  Musculoskeletal: subcutaneous dorsocervical fat pad present. No other obvious abnormalities  Imaging: DG Chest 1 View  Result Date: 07/12/2020 CLINICAL DATA:  Central chest pain, no known injury, initial encounter EXAM: CHEST  1 VIEW COMPARISON:  07/10/2020 FINDINGS: Cardiac shadow is stable. The lungs are well aerated bilaterally. Mild left basilar atelectasis is seen. No pneumothorax or effusion is noted. No bony abnormality is seen. IMPRESSION: Mild left basilar atelectasis. Electronically Signed   By: Inez Catalina M.D.   On: 07/12/2020 14:05   DG Abd 1 View  Result Date: 07/12/2020 CLINICAL DATA:  No bowel movements for 3 days EXAM: ABDOMEN - 1 VIEW COMPARISON:  None. FINDINGS: Scattered large and small bowel gas is noted. Some retained fecal material is noted  primarily in the right colon. No obstructive changes are seen. No free air is noted. No bony abnormality is seen. IMPRESSION: Retained fecal material primarily in the right colon. Electronically Signed   By: Inez Catalina M.D.   On: 07/12/2020 14:04    Labs: BMET Recent Labs  Lab 07/10/20 1148 07/11/20 0404  07/12/20 0222 07/13/20 0432 07/14/20 0542  NA 135 139 137 137 137  K 5.3* 4.7 4.4 4.1 4.1  CL 104 109 107 109 106  CO2 20* 19* 20* 18* 20*  GLUCOSE 204* 123* 154* 128* 166*  BUN 53* 49* 49* 49* 53*  CREATININE 2.53* 2.38* 2.64* 2.90* 2.79*  CALCIUM 10.6* 10.2 9.6 9.1 9.6  PHOS  --  5.9*  --  6.5* 4.9*   CBC Recent Labs  Lab 07/11/20 0404 07/12/20 0222 07/13/20 0432 07/14/20 0542  WBC 5.9 7.1 5.8 8.1  HGB 10.3* 10.2* 10.4* 10.5*  HCT 31.4* 29.7* 30.4* 30.7*  MCV 90.2 89.5 89.7 89.8  PLT 225 235 223 251    Medications:    . allopurinol  300 mg Oral QHS  . amLODipine  5 mg Oral Daily  . aspirin EC  81 mg Oral Daily  . carvedilol  6.25 mg Oral BID WC  . Chlorhexidine Gluconate Cloth  6 each Topical Q0600  . estradiol  0.05 mg Transdermal Weekly  . gemfibrozil  300 mg Oral QAC breakfast  . gemfibrozil  600 mg Oral QAC supper  . heparin injection (subcutaneous)  5,000 Units Subcutaneous Q8H  . insulin aspart  0-15 Units Subcutaneous TID WC  . insulin aspart  0-5 Units Subcutaneous QHS  . insulin glargine  30 Units Subcutaneous QHS  . levothyroxine  175 mcg Oral Daily  . lurasidone  20 mg Oral QHS  . melatonin  5 mg Oral QHS  . mupirocin ointment  1 application Nasal BID  . pantoprazole  40 mg Oral Daily  . polyethylene glycol  17 g Oral Daily  . pregabalin  150 mg Oral Daily  . senna-docusate  2 tablet Oral BID  . sertraline  75 mg Oral Daily   Dr. Jose Persia Internal Medicine PGY-2  07/14/2020, 9:23 AM

## 2020-07-14 NOTE — Therapy (Signed)
Called to assess pt complaining of SOB with no visual distress noted by the nurse prn given before arrival. When I arrived pt was sleeping with saturation 96% on 2L/[place] breath sounds clear no intervention at this time.

## 2020-07-14 NOTE — Progress Notes (Signed)
Patient called to nurses station complaining of shortness of breath. Patient asked for her prn Albuterol and this nurse administered the medication to help with patient's breathing. Pulse oximetry revealed oxygen saturation of 93-96, breath sounds clear bilateral lungs and patient was on 1L of oxygen but still advised that she is having shortness of breath and cannot speak. Oxygen increased to 2L, emotional supported given and patient encouraged to use incentive spirometer. Respiratory Therapist informed and advised to come and see patient. Will continue to monitor.   Milagros Loll, RN.

## 2020-07-14 NOTE — Plan of Care (Signed)
Nutrition Education Note:  RD consulted for nutrition education regarding diabetes and renal diets.  Lab Results  Component Value Date   HGBA1C 6.8 (H) 07/11/2020   RD provided "Carbohydrate Counting for People with Diabetes" handout from the Academy of Nutrition and Dietetics. Discussed different food groups and their effects on blood sugar, emphasizing carbohydrate-containing foods. Provided list of carbohydrates and recommended serving sizes of common foods.  Discussed importance of controlled and consistent carbohydrate intake throughout the day. Provided examples of ways to balance meals/snacks and encouraged intake of high-fiber, whole grain complex carbohydrates. Teach back method used.  RD consulted for Renal Education. Provided pt with Renal Food Pyramid, Eating with CKD and Type 2 Diabetes, and Understanding the Renal Diet packet. Reviewed food groups and specifically determined for patient's current nutritional status.   Explained why diet restrictions are needed and provided lists of foods to limit/avoid that are high potassium, sodium, and phosphorus. Provided specific recommendations on safer alternatives of these foods. Encouraged to mind these electrolytes, but not to follow strictly as pt is not on dialysis or has ESRD.  Discussed importance of protein intake at each meal and snack. Provided examples of how to maximize protein intake throughout the day. Teach back method used.  Expect good compliance.  Body mass index is 26.38 kg/m. Pt meets criteria for overweight based on current BMI.  Current diet order is carb mod, patient is consuming approximately 100% of meals at this time. Labs and medications reviewed.   No further nutrition interventions warranted at this time. RD contact information provided. If additional nutrition issues arise, please re-consult RD.  Derrel Nip, RD, LDN Registered Dietitian After Hours/Weekend Pager # in [place]

## 2020-07-15 LAB — RENAL FUNCTION PANEL
Albumin: 3.7 g/dL (ref 3.5–5.0)
Anion gap: 12 (ref 5–15)
BUN: 53 mg/dL — ABNORMAL HIGH (ref 6–20)
CO2: 17 mmol/L — ABNORMAL LOW (ref 22–32)
Calcium: 9.5 mg/dL (ref 8.9–10.3)
Chloride: 107 mmol/L (ref 98–111)
Creatinine, Ser: 2.72 mg/dL — ABNORMAL HIGH (ref 0.44–1.00)
GFR, Estimated: 22 mL/min — ABNORMAL LOW (ref >60)
Glucose, Bld: 126 mg/dL — ABNORMAL HIGH (ref 70–99)
Phosphorus: 4.9 mg/dL — ABNORMAL HIGH (ref 2.5–4.6)
Potassium: 4.8 mmol/L (ref 3.5–5.1)
Sodium: 136 mmol/L (ref 135–145)

## 2020-07-15 LAB — CBC
HCT: 31.2 % — ABNORMAL LOW (ref 36.0–46.0)
Hemoglobin: 10.4 g/dL — ABNORMAL LOW (ref 12.0–15.0)
MCH: 30.1 pg (ref 26.0–34.0)
MCHC: 33.3 g/dL (ref 30.0–36.0)
MCV: 90.4 fL (ref 80.0–100.0)
Platelets: 225 10*3/uL (ref 150–400)
RBC: 3.45 MIL/uL — ABNORMAL LOW (ref 3.87–5.11)
RDW: 14 % (ref 11.5–15.5)
WBC: 6.2 10*3/uL (ref 4.0–10.5)
nRBC: 0 % (ref 0.0–0.2)

## 2020-07-15 LAB — GLUCOSE, CAPILLARY
Glucose-Capillary: 139 mg/dL — ABNORMAL HIGH (ref 70–99)
Glucose-Capillary: 175 mg/dL — ABNORMAL HIGH (ref 70–99)
Glucose-Capillary: 200 mg/dL — ABNORMAL HIGH (ref 70–99)

## 2020-07-15 LAB — MAGNESIUM: Magnesium: 2 mg/dL (ref 1.7–2.4)

## 2020-07-15 MED ORDER — MECLIZINE HCL 25 MG PO TABS
25.0000 mg | ORAL_TABLET | Freq: Three times a day (TID) | ORAL | Status: DC
  Administered 2020-07-15: 25 mg via ORAL
  Filled 2020-07-15: qty 1

## 2020-07-15 MED ORDER — DOCUSATE SODIUM 100 MG PO CAPS
200.0000 mg | ORAL_CAPSULE | Freq: Two times a day (BID) | ORAL | Status: DC
  Administered 2020-07-15: 200 mg via ORAL
  Filled 2020-07-15: qty 2

## 2020-07-15 MED ORDER — SODIUM BICARBONATE 650 MG PO TABS: 650.0000 mg | ORAL_TABLET | Freq: Three times a day (TID) | ORAL | 0 refills | Status: DC

## 2020-07-15 MED ORDER — SODIUM BICARBONATE 650 MG PO TABS
650.0000 mg | ORAL_TABLET | Freq: Three times a day (TID) | ORAL | Status: DC
  Administered 2020-07-15: 650 mg via ORAL
  Filled 2020-07-15: qty 1

## 2020-07-15 MED ORDER — PREGABALIN 150 MG PO CAPS: 150.0000 mg | ORAL_CAPSULE | Freq: Every day | ORAL | Status: DC

## 2020-07-15 MED ORDER — HYDROCODONE-ACETAMINOPHEN 5-325 MG PO TABS: 1.0000 | ORAL_TABLET | Freq: Four times a day (QID) | ORAL | 0 refills | Status: AC | PRN

## 2020-07-15 MED ORDER — AMLODIPINE BESYLATE 5 MG PO TABS: 5.0000 mg | ORAL_TABLET | Freq: Every day | ORAL | 0 refills | Status: DC

## 2020-07-15 MED ORDER — ALBUTEROL SULFATE HFA 108 (90 BASE) MCG/ACT IN AERS: 1.0000 | INHALATION_SPRAY | Freq: Four times a day (QID) | RESPIRATORY_TRACT | 0 refills | Status: AC | PRN

## 2020-07-15 MED ORDER — POLYETHYLENE GLYCOL 3350 17 G PO PACK: 17.0000 g | PACK | Freq: Every day | ORAL | 0 refills | Status: DC

## 2020-07-15 MED ORDER — CARVEDILOL 6.25 MG PO TABS: 6.2500 mg | ORAL_TABLET | Freq: Two times a day (BID) | ORAL | 0 refills | Status: DC

## 2020-07-15 MED ORDER — BISACODYL 5 MG PO TBEC
5.0000 mg | DELAYED_RELEASE_TABLET | Freq: Every day | ORAL | Status: DC
  Administered 2020-07-15: 5 mg via ORAL
  Filled 2020-07-15: qty 1

## 2020-07-15 NOTE — TOC Transition Note (Signed)
Transition of Care [facility]) - CM/SW Discharge Note   Patient Details  Name: Brandi Love MRN: [id number] Date of Birth: [birth year]  Transition of Care [facility]) CM/SW Contact:  Bartholomew Crews, RN Phone Number: [phone] 07/15/2020, 5:10 PM   Clinical Narrative:     Spoke with patient at the bedside. Patient able to easily read lips for communication. Verified home address and PCP in Epic. Mom to provide transportation home at discharge. Discussed plans for transition home with home health. Discussed home health agency choice. Referral accepted by [facility] for PT, OT. Patient declined recommendation for 3-N-1 stating that she prefers her toilet. No further TOC needs identified at this time.   Final next level of care: [place] Barriers to Discharge: No Barriers Identified   Patient Goals and CMS Choice Patient states their goals for this hospitalization and ongoing recovery are:: return home CMS Medicare.gov Compare Post Acute Care list provided to:: Patient Choice offered to / list presented to : Patient  Discharge Placement                       Discharge Plan and Services In-house Referral: NA Discharge Planning Services: CM Consult Post Acute Care Choice: Home Health          DME Arranged: N/A DME Agency: NA       HH Arranged: OT,PT [place]: [facility] [facility] [employer] (now known as [employer]) Date [place]: 07/15/20 Time [place]: 1709 Representative spoke with at [place]: [place]  Social Determinants of Health ([place]) Interventions     Readmission Risk Interventions No flowsheet data found.

## 2020-07-15 NOTE — Progress Notes (Signed)
[facility] Progress Note    Assessment/ Plan:    Assessment/Recommendations: Brandi Love is a/an 45 y.o. female with a past medical history notable for CKD Stage 3b, T2DM on insulin, congenital hypothyroidism, bilateral sensineural hearing loss s/p cochlear implants, HLD, familial hypertriglyceridemia, HTN, eating disorder who is currently admitted for AKI.  1. AKI, stable: Creatinine on admission had been stable compared to her creatinine this past 3 months but did have a peak cr of 2.9 on 3/24.  Etiology at this time undetermined, suspecting that this was a component of "relative" hypotension (AKA "normotensive AKI").  -Cr overall stable which is reassuring -Outpatient follow up with Dr. Justin Mend   -Maintain MAP>65 for optimal renal perfusion.  -Avoid further nephrotoxins including NSAIDS, Morphine. Unless absolutely necessary, avoid CT with contrast and/or MRI with gadolinium.     2. CKD Stage 3b: Follows with Dr. Pamala Duffel kidney; CKD thought to be secondary to arterionephrosclerosis. follow-up on discharge.  3. Type 2 Diabetes Mellitus with hyperglycemia:  -Management per primary  4. HTN: Well-controlled on current regimen which includes Coreg and amlodipine. BP doses adjusted 3/24 and BP marginally higher today.  -Continue Amlodipine to 5 mg daily -Continue Coreg to 6.25 mg daily   5. Dizziness: Concerning for BPV versus 2/2 to polypharmacy vs orthostasis.  -Recommend renal dosing of Lyrica and Tramadol  6. Generalized Edema: Patient describes a 1 month history of facial edema with truncal edema that eventually did spread to the extremities.  This may be an indication of worsening proteinuria, however given it started in her face and patient does have some cushingoid features. AM cortisol was within normal range at this time. Recommend follow up with Endocrinology.  -overall euvolemic right now  7. Metabolic acidosis, non-anion gap -will start nahco3  '650mg'$  tid. Seems to be a chronic issue. Possible RTA? Can try for a litholink as an outpatient to further look into this  Gean Quint, MD [facility]  Subjective:    No complaints today. Feels hot (quite warm in her room). Has been sleeping most of the day   Objective:   BP 131/80 (BP Location: Right Arm)   Pulse 98   Temp 98.6 F (37 C) (Oral)   Resp 16   Wt 78.7 kg   LMP  (LMP Unknown)   SpO2 98%   BMI 26.38 kg/m   Intake/Output Summary (Last 24 hours) at 07/15/2020 1412 Last data filed at 07/15/2020 1100 Gross per 24 hour  Intake 600 ml  Output 350 ml  Net 250 ml   Weight change:   Physical Exam: General: well-appearing, no acute distress CV:no edema of the extremities  Musculoskeletal: no edema Neuro: awake, alert, moves all ext spontaneously  Imaging: No results found.  Labs: BMET Recent Labs  Lab 07/10/20 1148 07/11/20 0404 07/12/20 0222 07/13/20 0432 07/14/20 0542 07/15/20 0254  NA 135 139 137 137 137 136  K 5.3* 4.7 4.4 4.1 4.1 4.8  CL 104 109 107 109 106 107  CO2 20* 19* 20* 18* 20* 17*  GLUCOSE 204* 123* 154* 128* 166* 126*  BUN 53* 49* 49* 49* 53* 53*  CREATININE 2.53* 2.38* 2.64* 2.90* 2.79* 2.72*  CALCIUM 10.6* 10.2 9.6 9.1 9.6 9.5  PHOS  --  5.9*  --  6.5* 4.9* 4.9*   CBC Recent Labs  Lab 07/12/20 0222 07/13/20 0432 07/14/20 0542 07/15/20 0254  WBC 7.1 5.8 8.1 6.2  HGB 10.2* 10.4* 10.5* 10.4*  HCT 29.7* 30.4* 30.7* 31.2*  MCV 89.5 89.7 89.8 90.4  PLT 235 223 251 225    Medications:    . allopurinol  300 mg Oral QHS  . amLODipine  5 mg Oral Daily  . aspirin EC  81 mg Oral Daily  . bisacodyl  5 mg Oral Q1200  . carvedilol  6.25 mg Oral BID WC  . Chlorhexidine Gluconate Cloth  6 each Topical Q0600  . docusate sodium  200 mg Oral BID  . estradiol  0.05 mg Transdermal Weekly  . gemfibrozil  300 mg Oral QAC breakfast  . gemfibrozil  600 mg Oral QAC supper  . heparin injection (subcutaneous)  5,000 Units  Subcutaneous Q8H  . insulin aspart  0-15 Units Subcutaneous TID WC  . insulin aspart  0-5 Units Subcutaneous QHS  . insulin glargine  30 Units Subcutaneous QHS  . levothyroxine  175 mcg Oral Daily  . lurasidone  20 mg Oral QHS  . melatonin  5 mg Oral QHS  . mupirocin ointment  1 application Nasal BID  . pantoprazole  40 mg Oral Daily  . polyethylene glycol  17 g Oral Daily  . pregabalin  150 mg Oral Daily  . sertraline  75 mg Oral Daily   07/15/2020, 2:12 PM

## 2020-07-15 NOTE — Progress Notes (Signed)
SATURATION QUALIFICATIONS: (This note is used to comply with regulatory documentation for home oxygen)  Patient Saturations on Room Air at Rest = 97%  Patient Saturations on Room Air while Ambulating = 96%  Patient Saturations on 0 Liters of oxygen while Ambulating = 96%  Please briefly explain why patient needs home oxygen:  Patient was verbalizing light headedness while walking slowly with front wheel walker ,and intermittently showed as if she would falling backward.

## 2020-07-15 NOTE — Progress Notes (Signed)
Physical Therapy Treatment Patient Details Name: Brandi Love MRN: [id number] DOB: 05-20-1975 Today's Date: 07/15/2020    History of Present Illness Brandi Love is a 45 y/o female who reported to ED with complaints of chest pain, SOB, dizziness, and B LE swelling. Pt was admitted on 07/10/20 and diagnosed with acute renal failure. PMH includes deafness with cochlear implants, anemia, anorexia, DM, GERD, hypothyroidism, seizures, fibromyagia, tachycardia, surgery to correct a "lazy eye" in 1980s, and benign lumpectomy.    PT Comments    Pt with improved tolerance for activity this session despite continued reports of dizziness. Pt is able to implement gaze stabilization technique well when mobilizing, but does continue to require UE support for all mobility to prevent falls. PT provides education on HEP as well as home/task modifications to improve safety and reduce falls risk. Pt denied CIR. PT discusses the benefit of outpatient vestibular PT however the pt does not have transportation and reports a preference for HHPT. Mobilizing up and down 20 steps would also likely fatigue this patient prior to arriving to outpatient PT at this time. PT thus recommends HHPT, preferably with a vestibular trained PT. Acute PT will continue to follow until discharge is complete.  Follow Up Recommendations  Home health PT;Supervision for mobility/OOB     Equipment Recommendations  None recommended by PT (pt declines 3in1, no other needs)    Recommendations for Other Services       Precautions / Restrictions Precautions Precautions: Fall Precaution Comments: deaf (has cochlear implants, reads lips well), dizziness, monitor O2 Restrictions Weight Bearing Restrictions: No    Mobility  Bed Mobility               General bed mobility comments: not assessed    Transfers Overall transfer level: Needs assistance Equipment used: Rolling walker (2 wheeled) Transfers: Sit to/from Stand Sit to Stand:  Supervision            Ambulation/Gait Ambulation/Gait assistance: Min guard Gait Distance (Feet): 200 Feet Assistive device: Rolling walker (2 wheeled) Gait Pattern/deviations: Step-through pattern Gait velocity: reduced Gait velocity interpretation: <1.31 ft/sec, indicative of household ambulator General Gait Details: pt with slowed step-through gait, 3 LOB which pt corrects for with stepping strategy and UE support of RW. PT encourages gaze stabilization during mobility and segmental turning with eyes then head then body toward destination.   Stairs Stairs: Yes Stairs assistance: Min guard Stair Management: One rail Right;Sideways;Step to pattern Number of Stairs: 11     Wheelchair Mobility    Modified Rankin (Stroke Patients Only)       Balance Overall balance assessment: Needs assistance Sitting-balance support: No upper extremity supported;Feet supported Sitting balance-Leahy Scale: Good     Standing balance support: Bilateral upper extremity supported Standing balance-Leahy Scale: Poor Standing balance comment: Reliant on UE support                            Cognition Arousal/Alertness: Awake/alert Behavior During Therapy: WFL for tasks assessed/performed Overall Cognitive Status: Within Functional Limits for tasks assessed                                        Exercises Other Exercises Other Exercises: PT provides reinforcement of VOR 1 and introduces VOR x2 as a progression.    General Comments General comments (skin integrity, edema, etc.): VSS on RA, PT  provides education on gaze stabilization and home safety measures including utilizin a shower seat and performing as many tasks as possible in a seated position to reduce falls risk      Pertinent Vitals/Pain Pain Assessment: No/denies pain    Home Living                      Prior Function            PT Goals (current goals can now be found in the  care plan section) Acute Rehab PT Goals Patient Stated Goal: breathe easier, go home Progress towards PT goals: Progressing toward goals    Frequency    Min 3X/week      PT Plan Discharge plan needs to be updated    Co-evaluation              AM-PAC PT "6 Clicks" Mobility   Outcome Measure  Help needed turning from your back to your side while in a flat bed without using bedrails?: A Little Help needed moving from lying on your back to sitting on the side of a flat bed without using bedrails?: A Little Help needed moving to and from a bed to a chair (including a wheelchair)?: A Little Help needed standing up from a chair using your arms (e.g., wheelchair or bedside chair)?: A Little Help needed to walk in hospital room?: A Little Help needed climbing 3-5 steps with a railing? : A Little 6 Click Score: 18    End of Session Equipment Utilized During Treatment: Gait belt Activity Tolerance: Patient tolerated treatment well Patient left: in chair;with call bell/phone within reach Nurse Communication: Mobility status PT Visit Diagnosis: Unsteadiness on feet (R26.81);Dizziness and giddiness (R42)     Time: 1551-1701 PT Time Calculation (min) (ACUTE ONLY): 70 min  Charges:  $Gait Training: 23-37 mins $Therapeutic Exercise: 8-22 mins $Therapeutic Activity: 8-22 mins $Self Care/Home Management: 8-22                     Zenaida Niece, PT, DPT Acute Rehabilitation Pager: [phone]    Zenaida Niece 07/15/2020, 5:29 PM

## 2020-07-15 NOTE — Progress Notes (Signed)
Inpatient Rehab Admissions Coordinator Note:   Per therapy recommendations, pt was screened for CIR candidacy by Clemens Catholic, [place] CCC-SLP. At this time, it is not clear if Pt. Has rehab needs beyond vestibular rehab, which would be most appropriate in an outpatient setting. Pt. Also currently demonstrates need in only one rehab discipline. She will also need to demonstrate functional deficits beyond need for vestibular rehab in  occupational and/or speech therapy to qualify for CIR. I will not place consult at this time, but will reach out to acute rehab to clarify patient's needs.   Clemens Catholic, [place], [place] Admissions Coordinator  [phone] ([place]) [phone] (office)

## 2020-07-16 NOTE — Discharge Summary (Signed)
[facility] Discharge Summary   Patient: Brandi Love [id number]  PCP: Bernerd Limbo, MD  Date of admission: 07/10/2020   Date of discharge: 07/15/2020     Discharge Diagnoses:  Principal Problem:   Acute renal failure superimposed on stage 3 chronic kidney disease ([place]) Active Problems:   Hyperlipidemia associated with type 2 diabetes mellitus ([place])   AKI (acute kidney injury) ([place])   Congenital hypothyroidism   Hypertension associated with diabetes ([place])   Insulin dependent type 2 diabetes mellitus ([place])   Hyperkalemia   Admitted From: home Disposition:  Home with home health  Recommendations for Outpatient Follow-up:  1. PCP: follow up with Nephrology and PCP in  1 week. 2. PCP to discuss alternate medication for fibromyalgia, does Cymbalta will be a better option?,  Is Flexeril needed? 3. Follow up LABS/TEST:  Renal function, lipid panel 4. New Meds: sodium bicarb, albuterol, MiraLAX, Norco 5. Changed meds: Norvasc, carvedilol dose reduced due to hypotension.  Lyrica dose reduced due to renal dysfunction 6. Stopped meds: Tramadol   Follow-up Information    [facility] Follow up.   Why: the office will call to schedule home health visits Contact information: [address], [place], [place] [phone]       Bernerd Limbo, MD. Schedule an appointment as soon as possible for a visit in 1 week(s).   Specialty: Family Medicine Contact information: [address] [place] [address] [phone]        Edrick Oh, MD. Schedule an appointment as soon as possible for a visit in 2 week(s).   Specialty: Nephrology Contact information: [place] [address] [phone]              Discharge Instructions    Diet - low sodium heart healthy   Complete by: As directed    Increase activity slowly   Complete by: As directed       Diet recommendation: Renal  diet  Activity: The patient is advised to gradually reintroduce usual activities, as tolerated  Discharge Condition: stable  Code Status: Full code   History of present illness: As per the H and P dictated on admission, "Karlie Saker Gildner is a 45 y.o. female with medical history significant for CKD stage III, IDT2DM, congenital hypothyroidism, bilateral sensorineural hearing loss with cochlear implants, hyperlipidemia, hypertriglyceridemia, eating disorder, OCD, depression, anxiety, borderline personality who presented to the ED from her cardiologist's office for evaluation of shortness of breath.  Patient reports 2 weeks of shortness of breath after moderate exertion and when she bends over.  She has been feeling generally weak.  She reports poor oral intake due to feeling as if she gets short of breath when she eats.  She denies any feeling of choking on food or liquids.  She has not had any heartburn/acid reflux.  She reports some nausea without emesis.  She reports nonspecific chest discomfort with exertion.  She reports increased urinary frequency with some dysuria.  She also reports ongoing dizziness and lightheadedness during this time which occur even with rest.  She denies taking any NSAIDs due to her CKD.  She says her CKD as a result of aspirin overdose in 2008.  She says she has obtained a pulse ox and has been checking her O2 levels at home, lowest O2 saturation was 93%.  She has been seen by her PCP for her symptoms and was referred to cardiology.  She was seen by  Dr. Einar Gip earlier today. She was noted to be tachycardic with lower extremity edema.  She was unsteady with gait.  Chest pain was felt to be musculoskeletal however there was concern for possible PE therefore she was sent to the ED for further evaluation.   ED Course:  Initial vitals showed BP 130/81, pulse 86, RR 20, temp 98.0 F, SPO2 100% on room air.  Labs show sodium 135, potassium 5.3, bicarb 20, BUN 53, creatinine  2.53 (variable creatinine on prior labs, baseline appears <2.0), GFR 24, calcium 10.6, WBC 8.9, hemoglobin 11.0, platelets 247,000, D-dimer <0.27, BNP 49.2, high-sensitivity troponin I 3x2, LFTs within normal limits, albumin 4.0, i-STAT beta-hCG <5.0.  SARS-CoV-2 PCR is negative.  Influenza A/B PCR's are negative.  2 view chest x-ray is negative for focal consolidation, edema, or effusion.  CT head without contrast shows postoperative changes from bilateral cochlear implants without acute intracranial abnormality.  Patient was given 500 cc normal saline x2, Lokelma, tramadol, Tylenol, meclizine, Zofran, Flexeril, and albuterol inhaler treatment.  The hospitalist service was consulted to admit for further evaluation and management of AKI on CKD 3."  Hospital Course:  Summary of her active problems in the hospital is as following.   1.Acute kidney injury on CKD 3B Non-anion gap metabolic acidosis Baseline serum creatinine around 1.8 On presentation serum creatinine 2.5. Finally stable. Reportedly presented with volume overload. Likely recurrent worsening is secondary to poor p.o. intake and will liberalize oral fluids for now. Nephrology consulted. Blood pressure medication adjusted.  Recommend outpatient follow-up with nephrology. Started on bicarb. Appreciate assistance.  2.Abdominal pain Constipation Intractable nausea without vomiting. X-ray abdomen negative for any acute abnormality  patient has constipation.  Will continue bowel regimen.  3.  Pleuritic chest pain. Shortness of breath Nocturnal dyspnea and orthopnea Chest pain pleuritic in nature. X-ray negative for pneumonia, pneumothorax or acute fracture. EKG negative acute ischemia or inflammation D-dimer negative. Continue pain control.  Changing tramadol to Norco due to persistent pain. Clinical exam also unremarkable as well.  Patient reported symptoms nocturnal dyspnea, overnight oximetry shows normal  saturation. Patient also reported orthopnea.  Ambulatory saturations were normal.  Continue incentive spirometry.  4.Acute on chronic dizziness likely peripheral vertigo. Patient reports severe dizziness which she reports as a vertiginous sensation. Unable to elicit any nystagmus but patient does have dizziness on exam looking for nystagmus. No focal deficit at the time of my evaluation. Continue Phenergan.  Continue meclizine.  Vestibular rehab consultation highly appreciated.  We will continue home health.  5. Insulin-dependent type 2 diabetes: Uncontrolled with hyperglycemia with renal complication with long-term insulin use. Continue home regimen.  6. Hypothyroidism: Continue Synthroid.  7. Hypertension: Currently blood pressure stable. On Norvasc 5 mg, Coreg 6.25 mg.  8. Hyperlipidemia/hypertriglyceridemia: Nephrology ordered lipid panel.  Triglyceride level is 1000.  Patient is on gemfibrozil which is currently on hold will resume it. Outpatient follow-up with PCP for repeat LDL.  9. Depression/anxiety/eating disorder/BPD: Fibromyalgia Continue sertraline. In the setting of fibromyalgia patient will benefit from being on Cymbalta versus Zoloft.  10. Pain control Chronic pain syndrome secondary to fibromyalgia Patient has chronic pain.  Takes Lyrica and tramadol. Due to renal dysfunction Lyrica dose reduced from 150 mg 3 times daily to 150 mg daily. Tramadol changed to Norco due to reported worsening pain and inability to add further medication. Also not sure what role Flexeril is playing here in the setting of fibromyalgia causing pain.  Recommend PCP to discuss alternate medication. - [facility] Controlled  Substance Reporting System database was reviewed. - 5 day supply was provided. - Patient was instructed, not to drive, operate heavy machinery, perform activities at heights, swimming or participation in water activities or provide baby sitting services while  on Pain, Sleep and Anxiety Medications; until her outpatient Physician has advised to do so again.  - Also recommended to not to take more than prescribed Pain, Sleep and Anxiety Medications.  Patient was seen by physical therapy, who recommended Home Health. On the day of the discharge the patient's vitals were stable, and no other acute medical condition were reported by patient. The patient was felt safe to be discharge at Home with Home health.  Consultants: Nephrology  Procedures: none  DISCHARGE MEDICATION: Allergies as of 07/15/2020      Reactions   Fish Oil Anaphylaxis   Facial edema, itching   Iodinated Diagnostic Agents Anaphylaxis   'cant breathe'   Other Itching, Swelling, Rash, Anaphylaxis   Unable to take due to renal insufficency Unable to ttake due to renal insufficency Eyes swell 'cant breathe' 'cant breathe' 'cant breathe' 'cant breathe' Other reaction(s): Other (See Comments) Patient stated she can not take because she has renal insufficiency Unable to ttake due to renal insufficency 'cant breathe' Other reaction(s): Other (See Comments) Chinese Food   Phenylephrine-guaifenesin Shortness Of Breath, Swelling   Throat swelling   Propoxyphene Anaphylaxis   Hydromorphone Hcl Itching   Redness, can use paper tape Unable to ttake due to renal insufficency Redness, can use paper tape Unable to ttake due to renal insufficency   Abilify [aripiprazole] Nausea And Vomiting   Bee Venom    Other reaction(s): Other (See Comments)   Dhea [nutritional Supplements] Nausea And Vomiting, Swelling   Dihydroergotamine Nausea And Vomiting   Divalproex Sodium Nausea And Vomiting, Other (See Comments)   Weight gain   Doxycycline Nausea And Vomiting   Erythromycin Nausea And Vomiting   Indomethacin Nausea And Vomiting   Lactose Intolerance (gi) Nausea And Vomiting   Risperidone And Related Other (See Comments)   Out of body feeling   Shellfish Allergy Itching, Swelling    Eyes swell   Statins Other (See Comments)   Cannot tolerate   Tape Other (See Comments)   Redness/ please use paper tape   Dihydroergotamine Nausea And Vomiting   Lactose Intolerance (gi) Nausea And Vomiting   Nsaids Other (See Comments)   Unable to ttake due to renal insufficency   Sulfa Antibiotics Itching   Redness/ please use paper tape Redness, can use paper tape   Tape Other (See Comments)   Redness, can use paper tape      Medication List    STOP taking these medications   traMADol 50 MG tablet Commonly known as: ULTRAM     TAKE these medications   acetaminophen 500 MG tablet Commonly known as: TYLENOL Take 1,000-3,000 mg by mouth See admin instructions. Take 2 tablets in the morning, and  5 tablets at bedtime   albuterol 108 (90 Base) MCG/ACT inhaler Commonly known as: VENTOLIN HFA Inhale 1 puff into the lungs every 6 (six) hours as needed for wheezing or shortness of breath.   allopurinol 300 MG tablet Commonly known as: ZYLOPRIM Take 300 mg by mouth at bedtime.   amLODipine 5 MG tablet Commonly known as: NORVASC Take 1 tablet (5 mg total) by mouth daily. What changed:   medication strength  how much to take   aspirin EC 81 MG tablet Take 81 mg by mouth daily.  carvedilol 6.25 MG tablet Commonly known as: COREG Take 1 tablet (6.25 mg total) by mouth 2 (two) times daily with a meal. What changed:   medication strength  how much to take  when to take this   Cinnamon 500 MG Tabs Take 2,000 mg by mouth in the morning and at bedtime.   cyclobenzaprine 5 MG tablet Commonly known as: FLEXERIL Take 5 mg by mouth 3 (three) times daily.   D3-50 1.25 MG (50000 UT) capsule Generic drug: Cholecalciferol Take 50,000 Units by mouth every Monday.   dicyclomine 20 MG tablet Commonly known as: BENTYL Take 1 tablet (20 mg total) by mouth 2 (two) times daily. What changed:   when to take this  reasons to take this   docusate sodium 100 MG  capsule Commonly known as: COLACE Take 200 mg by mouth 2 (two) times daily.   estradiol 0.05 mg/24hr patch Commonly known as: CLIMARA - Dosed in mg/24 hr Place 0.05 mg onto the skin once a week.   fluticasone 50 MCG/ACT nasal spray Commonly known as: FLONASE Place 1 spray into both nostrils daily as needed for allergies or rhinitis.   gemfibrozil 600 MG tablet Commonly known as: LOPID Take 300-600 mg by mouth See admin instructions. 600 mg in the morning and 300 mg in the evening   glimepiride 4 MG tablet Commonly known as: AMARYL Take 4 mg by mouth daily.   HYDROcodone-acetaminophen 5-325 MG tablet Commonly known as: NORCO/VICODIN Take 1 tablet by mouth every 6 (six) hours as needed for moderate pain or severe pain.   Invokana 300 MG Tabs tablet Generic drug: canagliflozin Take 300 mg by mouth every morning.   Latuda 20 MG Tabs tablet Generic drug: lurasidone Take 20 mg by mouth at bedtime.   levothyroxine 150 MCG tablet Commonly known as: SYNTHROID Take 150 mcg by mouth daily.   magnesium oxide 400 (241.3 Mg) MG tablet Commonly known as: MAG-OX Take 400 mg by mouth 2 (two) times a day.   meclizine 25 MG tablet Commonly known as: ANTIVERT Take 25 mg by mouth 3 (three) times daily as needed for dizziness.   melatonin 5 MG Tabs Take 5 mg by mouth at bedtime.   metoCLOPramide 10 MG tablet Commonly known as: Reglan Take 1 tablet (10 mg total) by mouth every 6 (six) hours as needed for nausea or vomiting.   multivitamin with minerals Tabs tablet Take 1 tablet by mouth daily.   NOVOLOG MIX 70/30 FLEXPEN [place] Inject 45 Units into the skin daily with supper.   pantoprazole 40 MG tablet Commonly known as: PROTONIX Take 40 mg by mouth daily.   polyethylene glycol 17 g packet Commonly known as: MIRALAX / GLYCOLAX Take 17 g by mouth daily.   pregabalin 150 MG capsule Commonly known as: LYRICA Take 1 capsule (150 mg total) by mouth daily. What changed: when to  take this   promethazine 25 MG tablet Commonly known as: PHENERGAN Take 25 mg by mouth every 8 (eight) hours as needed for nausea or vomiting.   sertraline 50 MG tablet Commonly known as: ZOLOFT Take 50 mg by mouth daily.   sodium bicarbonate 650 MG tablet Take 1 tablet (650 mg total) by mouth 3 (three) times daily.   vitamin C 500 MG tablet Commonly known as: ASCORBIC ACID Take 500 mg by mouth daily.       Discharge Exam: Filed Weights   07/11/20 0330 07/12/20 2114 07/14/20 0621  Weight: 81.9 kg 81.4 kg 78.7 kg  Vitals:   07/14/20 2044 07/15/20 1100  BP: 122/79 131/80  Pulse: 95 98  Resp: 14 16  Temp: 98.7 F (37.1 C) 98.6 F (37 C)  SpO2: 98% 98%   General: Appear in no distress, no Rash; Oral Mucosa Clear, moist. no Abnormal Neck Mass Or lumps, Conjunctiva normal  Cardiovascular: S1 and S2 Present, no Murmur Respiratory: good respiratory effort, Bilateral Air entry present and CTA, no Crackles, no wheezes Abdomen: Bowel Sound present, Soft and no tenderness Extremities: no Pedal edema Neurology: alert and oriented to time, place, and person affect appropriate. no new focal deficit  The results of significant diagnostics from this hospitalization (including imaging, microbiology, ancillary and laboratory) are listed below for reference.    Significant Diagnostic Studies: DG Chest 1 View  Result Date: 07/12/2020 CLINICAL DATA:  Central chest pain, no known injury, initial encounter EXAM: CHEST  1 VIEW COMPARISON:  07/10/2020 FINDINGS: Cardiac shadow is stable. The lungs are well aerated bilaterally. Mild left basilar atelectasis is seen. No pneumothorax or effusion is noted. No bony abnormality is seen. IMPRESSION: Mild left basilar atelectasis. Electronically Signed   By: Inez Catalina M.D.   On: 07/12/2020 14:05   DG Chest 2 View  Result Date: 07/10/2020 CLINICAL DATA:  Chest pain, shortness of breath. EXAM: CHEST - 2 VIEW COMPARISON:  March 01, 2020.  FINDINGS: The heart size and mediastinal contours are within normal limits. Both lungs are clear. No pneumothorax or pleural effusion is noted. The visualized skeletal structures are unremarkable. IMPRESSION: No active cardiopulmonary disease. Electronically Signed   By: Marijo Conception M.D.   On: 07/10/2020 12:44   DG Abd 1 View  Result Date: 07/12/2020 CLINICAL DATA:  No bowel movements for 3 days EXAM: ABDOMEN - 1 VIEW COMPARISON:  None. FINDINGS: Scattered large and small bowel gas is noted. Some retained fecal material is noted primarily in the right colon. No obstructive changes are seen. No free air is noted. No bony abnormality is seen. IMPRESSION: Retained fecal material primarily in the right colon. Electronically Signed   By: Inez Catalina M.D.   On: 07/12/2020 14:04   CT Head Wo Contrast  Result Date: 07/10/2020 CLINICAL DATA:  Dizziness, history diabetes mellitus, seizures, deaf EXAM: CT HEAD WITHOUT CONTRAST TECHNIQUE: Contiguous axial images were obtained from the base of the skull through the vertex without intravenous contrast. Sagittal and coronal MPR images reconstructed from axial data set. COMPARISON:  08/26/2017 FINDINGS: Brain: BILATERAL cochlear implants with associated beam hardening artifacts. Normal ventricular morphology. No midline shift or mass effect. Within limitations of beam hardening artifacts, no intracranial hemorrhage, mass lesion or evidence of acute infarction identified. No extra-axial fluid collections. Vascular: No hyperdense vessels Skull: Postoperative changes from cochlear implants. Otherwise unremarkable. Sinuses/Orbits: Clear Other: N/A IMPRESSION: Postoperative changes from BILATERAL cochlear implants. No acute intracranial abnormalities. Electronically Signed   By: Lavonia Dana M.D.   On: 07/10/2020 19:37   US RENAL  Result Date: 07/10/2020 CLINICAL DATA:  Acute kidney injury on chronic renal disease. EXAM: RENAL / URINARY TRACT ULTRASOUND COMPLETE  COMPARISON:  CT abdomen pelvis 11/06/2019. FINDINGS: Right Kidney: Renal measurements: 9 x 4.7 x 4.2 cm = volume: 93 mL. Echogenicity is increased. No mass or hydronephrosis visualized. Left Kidney: Renal measurements: 10.1 x 4.3 x 5.6 cm = volume: 129 mL. Echogenicity is increased. No mass or hydronephrosis visualized. Urinary bladder: Appears normal for degree of bladder distention. Other: None. IMPRESSION: Increased echogenicity consistent with node renal parenchymal disease. Electronically Signed  By: Iven Finn M.D.   On: 07/10/2020 23:15    Microbiology: Recent Results (from the past 240 hour(s))  Resp Panel by RT-PCR (Flu A&B, Covid) Nasopharyngeal Swab     Status: None   Collection Time: 07/10/20  2:57 PM   Specimen: Nasopharyngeal Swab; Nasopharyngeal(NP) swabs in vial transport medium  Result Value Ref Range Status   SARS Coronavirus 2 by RT PCR NEGATIVE NEGATIVE Final    Comment: (NOTE) SARS-CoV-2 target nucleic acids are NOT DETECTED.  The SARS-CoV-2 RNA is generally detectable in upper respiratory specimens during the acute phase of infection. The lowest concentration of SARS-CoV-2 viral copies this assay can detect is 138 copies/mL. A negative result does not preclude SARS-Cov-2 infection and should not be used as the sole basis for treatment or other patient management decisions. A negative result may occur with  improper specimen collection/handling, submission of specimen other than nasopharyngeal swab, presence of viral mutation(s) within the areas targeted by this assay, and inadequate number of viral copies(<138 copies/mL). A negative result must be combined with clinical observations, patient history, and epidemiological information. The expected result is Negative.  Fact Sheet for Patients:  EntrepreneurPulse.com.au  Fact Sheet for Healthcare Providers:  IncredibleEmployment.be  This test is no t yet approved or cleared by  the [place] FDA and  has been authorized for detection and/or diagnosis of SARS-CoV-2 by FDA under an Emergency Use Authorization (EUA). This EUA will remain  in effect (meaning this test can be used) for the duration of the COVID-19 declaration under Section 564(b)(1) of the Act, 21 U.S.C.section 360bbb-3(b)(1), unless the authorization is terminated  or revoked sooner.       Influenza A by PCR NEGATIVE NEGATIVE Final   Influenza B by PCR NEGATIVE NEGATIVE Final    Comment: (NOTE) The Xpert Xpress SARS-CoV-2/FLU/RSV plus assay is intended as an aid in the diagnosis of influenza from Nasopharyngeal swab specimens and should not be used as a sole basis for treatment. Nasal washings and aspirates are unacceptable for Xpert Xpress SARS-CoV-2/FLU/RSV testing.  Fact Sheet for Patients: EntrepreneurPulse.com.au  Fact Sheet for Healthcare Providers: IncredibleEmployment.be  This test is not yet approved or cleared by the [place] FDA and has been authorized for detection and/or diagnosis of SARS-CoV-2 by FDA under an Emergency Use Authorization (EUA). This EUA will remain in effect (meaning this test can be used) for the duration of the COVID-19 declaration under Section 564(b)(1) of the Act, 21 U.S.C. section 360bbb-3(b)(1), unless the authorization is terminated or revoked.  Performed at [facility], [place] [address] [place], [place] [address]   MRSA PCR Screening     Status: Abnormal   Collection Time: 07/11/20  5:34 AM   Specimen: Nasal Mucosa; Nasopharyngeal  Result Value Ref Range Status   MRSA by PCR POSITIVE (A) NEGATIVE Final    Comment:        The GeneXpert MRSA Assay (FDA approved for NASAL specimens only), is one component of a comprehensive MRSA colonization surveillance program. It is not intended to diagnose MRSA infection nor to guide or monitor treatment for MRSA infections. RESULT CALLED TO, READ BACK BY  AND VERIFIED WITH: YAP,C RN 07/11/2020 AT [id number] SKEEN,P Performed at [facility], [place] [address] [place], [place] [address]      Labs: CBC: Recent Labs  Lab 07/11/20 0404 07/12/20 0222 07/13/20 0432 07/14/20 0542 07/15/20 0254  WBC 5.9 7.1 5.8 8.1 6.2  HGB 10.3* 10.2* 10.4* 10.5* 10.4*  HCT  31.4* 29.7* 30.4* 30.7* 31.2*  MCV 90.2 89.5 89.7 89.8 90.4  PLT 225 235 223 251 [id number]   Basic Metabolic Panel: Recent Labs  Lab 07/11/20 0404 07/12/20 0222 07/13/20 0432 07/14/20 0542 07/15/20 0254  NA 139 137 137 137 136  K 4.7 4.4 4.1 4.1 4.8  CL 109 107 109 106 107  CO2 19* 20* 18* 20* 17*  GLUCOSE 123* 154* 128* 166* 126*  BUN 49* 49* 49* 53* 53*  CREATININE 2.38* 2.64* 2.90* 2.79* 2.72*  CALCIUM 10.2 9.6 9.1 9.6 9.5  MG 2.1 2.1 2.2 2.1 2.0  PHOS 5.9*  --  6.5* 4.9* 4.9*   Liver Function Tests: Recent Labs  Lab 07/10/20 1530 07/13/20 0432 07/14/20 0542 07/15/20 0254  AST 16  --   --   --   ALT 20  --   --   --   ALKPHOS 67  --   --   --   BILITOT 0.6  --   --   --   PROT 6.9  --   --   --   ALBUMIN 4.0 3.4* 3.6 3.7   CBG: Recent Labs  Lab 07/14/20 1625 07/14/20 2046 07/15/20 0825 07/15/20 1146 07/15/20 1636  GLUCAP 154* 191* 139* 175* 200*    Time spent: 35 minutes  Signed:  Berle Mull  [facility] 07/15/2020 9:18 AM

## 2020-07-17 NOTE — Telephone Encounter (Signed)
I can see her in Oct as previously scheduled

## 2020-07-25 ENCOUNTER — Emergency Department (HOSPITAL_BASED_OUTPATIENT_CLINIC_OR_DEPARTMENT_OTHER)
Admission: EM | Admit: 2020-07-25 | Discharge: 2020-07-26 | Disposition: A | Discharge status: Home / Self Care | Payer: Medicare Other | Attending: Emergency Medicine | Admitting: Emergency Medicine

## 2020-07-25 ENCOUNTER — Other Ambulatory Visit: Payer: Self-pay

## 2020-07-25 ENCOUNTER — Encounter (HOSPITAL_BASED_OUTPATIENT_CLINIC_OR_DEPARTMENT_OTHER): Payer: Self-pay | Admitting: *Deleted

## 2020-07-25 ENCOUNTER — Emergency Department (HOSPITAL_BASED_OUTPATIENT_CLINIC_OR_DEPARTMENT_OTHER): Payer: Medicare Other

## 2020-07-25 DIAGNOSIS — E039 Hypothyroidism, unspecified: Secondary | ICD-10-CM | POA: Diagnosis not present

## 2020-07-25 DIAGNOSIS — Z9013 Acquired absence of bilateral breasts and nipples: Secondary | ICD-10-CM | POA: Insufficient documentation

## 2020-07-25 DIAGNOSIS — N183 Chronic kidney disease, stage 3 unspecified: Secondary | ICD-10-CM | POA: Insufficient documentation

## 2020-07-25 DIAGNOSIS — R0602 Shortness of breath: Secondary | ICD-10-CM | POA: Diagnosis not present

## 2020-07-25 DIAGNOSIS — N184 Chronic kidney disease, stage 4 (severe): Secondary | ICD-10-CM

## 2020-07-25 DIAGNOSIS — R609 Edema, unspecified: Secondary | ICD-10-CM | POA: Diagnosis not present

## 2020-07-25 DIAGNOSIS — Z79899 Other long term (current) drug therapy: Secondary | ICD-10-CM | POA: Diagnosis not present

## 2020-07-25 DIAGNOSIS — R791 Abnormal coagulation profile: Secondary | ICD-10-CM | POA: Insufficient documentation

## 2020-07-25 DIAGNOSIS — R0789 Other chest pain: Secondary | ICD-10-CM | POA: Diagnosis not present

## 2020-07-25 DIAGNOSIS — Z7982 Long term (current) use of aspirin: Secondary | ICD-10-CM | POA: Diagnosis not present

## 2020-07-25 DIAGNOSIS — E1122 Type 2 diabetes mellitus with diabetic chronic kidney disease: Secondary | ICD-10-CM | POA: Insufficient documentation

## 2020-07-25 DIAGNOSIS — N178 Other acute kidney failure: Secondary | ICD-10-CM | POA: Diagnosis not present

## 2020-07-25 DIAGNOSIS — Z794 Long term (current) use of insulin: Secondary | ICD-10-CM | POA: Insufficient documentation

## 2020-07-25 DIAGNOSIS — Z7984 Long term (current) use of oral hypoglycemic drugs: Secondary | ICD-10-CM | POA: Insufficient documentation

## 2020-07-25 DIAGNOSIS — I129 Hypertensive chronic kidney disease with stage 1 through stage 4 chronic kidney disease, or unspecified chronic kidney disease: Secondary | ICD-10-CM | POA: Diagnosis not present

## 2020-07-25 LAB — BASIC METABOLIC PANEL
Anion gap: 14 (ref 5–15)
BUN: 71 mg/dL — ABNORMAL HIGH (ref 6–20)
CO2: 18 mmol/L — ABNORMAL LOW (ref 22–32)
Calcium: 9.4 mg/dL (ref 8.9–10.3)
Chloride: 105 mmol/L (ref 98–111)
Creatinine, Ser: 2.91 mg/dL — ABNORMAL HIGH (ref 0.44–1.00)
GFR, Estimated: 20 mL/min — ABNORMAL LOW (ref >60)
Glucose, Bld: 140 mg/dL — ABNORMAL HIGH (ref 70–99)
Potassium: 4 mmol/L (ref 3.5–5.1)
Sodium: 137 mmol/L (ref 135–145)

## 2020-07-25 LAB — CBC
HCT: 29.4 % — ABNORMAL LOW (ref 36.0–46.0)
Hemoglobin: 10.9 g/dL — ABNORMAL LOW (ref 12.0–15.0)
MCH: 32 pg (ref 26.0–34.0)
MCHC: 37.1 g/dL — ABNORMAL HIGH (ref 30.0–36.0)
MCV: 86.2 fL (ref 80.0–100.0)
Platelets: 259 10*3/uL (ref 150–400)
RBC: 3.41 MIL/uL — ABNORMAL LOW (ref 3.87–5.11)
RDW: 14.1 % (ref 11.5–15.5)
WBC: 7.1 10*3/uL (ref 4.0–10.5)
nRBC: 0 % (ref 0.0–0.2)

## 2020-07-25 NOTE — ED Provider Notes (Signed)
[facility] EMERGENCY DEPARTMENT Provider Note   CSN: [id number] Arrival date & time: 07/25/20  2136   History Chief complaint: Shortness of breath  Brandi Love is a 45 y.o. female.  The history is provided by the patient.  She has history of diabetes, hyperlipidemia, chronic kidney disease and comes in because of difficulty breathing today.  She had seen her nephrologist and was told that her creatinine had increased to 3.16.  She was recently hospitalized because of acute kidney injury.  At home, she noted dyspnea which has been increasing over the last several days.  Dyspnea is worse with laying supine and with exertion.  She denies fever, chills, sweats.  She denies any cough.  She denies nausea or vomiting.  She has had some intermittent chest pain which is sharp but not worse with deep breath.  She has noted some peripheral edema.  She has tried to use her inhaler without any benefit.  Past Medical History:  Diagnosis Date  . Anemia   . Anorexia   . Anxiety   . Arthritis    "hands" (09/18/2016)  . Chronic kidney disease   . Chronic lower back pain   . Chronic renal insufficiency   . Deaf   . Deafness   . Depression   . Diabetes ([place])    "borderline"  . Eating disorder   . Endometriosis   . GERD (gastroesophageal reflux disease)   . History of kidney stones   . Hyperlipidemia   . 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 MDD (major depressive disorder)   . Migraine    "frequency varies" (09/18/2016)  . OCD (obsessive compulsive disorder)   . Pneumonia 2011  . PONV (postoperative nausea and vomiting)   . Pulmonary embolism ([place]) 10/2001  . Seizures ([place]) 09/2001 X 1   "day after my hysterectomy"  . Seizures ([place])   . Self mutilating behavior   . Tachycardia     Patient Active Problem List   Diagnosis Date Noted  . Acute kidney injury superimposed on chronic kidney disease ([place]) 07/10/2020  . Hyperkalemia 07/10/2020  . Deaf 10/01/2018  .  Hematochezia 10/01/2018  . Hypertension associated with diabetes ([place]) 10/01/2018  . Loss of appetite 10/01/2018  . Mental health problem 10/01/2018  . Seizures ([place]) 10/01/2018  . Abdominal pain 07/02/2018  . Gastroesophageal reflux disease without esophagitis 02/16/2018  . Primary insomnia 02/16/2018  . Prediabetes 01/28/2018  . Insulin dependent type 2 diabetes mellitus ([place]) 01/28/2018  . Syncope 08/26/2017  . Acute renal failure superimposed on stage 3 chronic kidney disease ([place]) 08/26/2017  . Orthostatic syncope   . Self mutilating behavior 08/08/2017  . Acne 06/11/2017  . Self-care deficit for bathing 06/11/2017  . Urinary tract infection with hematuria   . OCD (obsessive compulsive disorder) 09/29/2016  . Anorexia nervosa with bulimia 09/29/2016  . Deafness congenital 09/29/2016  . Intractable nausea and vomiting 09/29/2016  . Bloody diarrhea 09/29/2016  . AKI (acute kidney injury) ([place]) 09/29/2016  . Severe dehydration 09/29/2016  . Severe protein-calorie malnutrition ([place]) 09/29/2016  . Congenital hypothyroidism 09/29/2016  . Hypertriglyceridemia 09/29/2016  . Anemia 09/29/2016  . Endometriosis 09/29/2016  . MDD (major depressive disorder) 09/29/2016  . Hyperlipidemia 09/29/2016  . CKD (chronic kidney disease), stage III ([place]) 09/29/2016  . Acute kidney injury ([place]) 09/29/2016  . OAB (overactive bladder) 09/29/2016  . Sepsis ([place]) 09/18/2016  . Acute pyelonephritis 09/18/2016  . Urinary incontinence 08/09/2015  . MDD (major  depressive disorder), recurrent, severe, with psychosis ([place]) 12/11/2014  . Severe recurrent major depressive disorder with psychotic symptoms ([place]) 12/11/2014  . Migraine 07/02/2013  . Eating disorder, unspecified 11/23/2012  . Hypothyroidism 11/23/2012  . Hyperlipidemia associated with type 2 diabetes mellitus ([place]) 11/23/2012  . Sinus tachycardia 11/23/2012  . CKD (chronic kidney disease) stage 3, GFR 30-59 ml/min ([facility]) 11/18/2012  .  Protein-calorie malnutrition, severe ([place]) 11/13/2012  . Abdominal pain, epigastric 11/12/2012  . Dehydration 11/12/2012  . Acute on chronic renal failure ([place]) 11/12/2012  . Leukocytosis, unspecified 11/12/2012  . Hyponatremia 11/12/2012  . Borderline personality disorder ([place]) 11/12/2012    Past Surgical History:  Procedure Laterality Date  . ABDOMINAL HYSTERECTOMY  09/2001   'except for right ovary'  . BREAST LUMPECTOMY Left 2000   benign  . BREAST LUMPECTOMY Right 2002   benign  . COCHLEAR IMPLANT Bilateral 2004-2010   "right-left"  . COLONOSCOPY  06/2015    Dr Rolm Bookbinder at [facility] for rectal bleeding: internal hemorrhoids, small anal fissure.    . ESOPHAGOGASTRODUODENOSCOPY N/A 10/07/2016   Procedure: ESOPHAGOGASTRODUODENOSCOPY (EGD);  Surgeon: Ladene Artist, MD;  Location: [facility] ENDOSCOPY;  Service: Endoscopy;  Laterality: N/A;  . EYE MUSCLE SURGERY Left 1980   "lazy eye"  . EYE MUSCLE SURGERY Right 1990   "lazy eye"  . LAPAROSCOPIC ABDOMINAL EXPLORATION  2001 and 2002   for endometriosis  . OOPHORECTOMY Right 08/2019     OB History   No obstetric history on file.     Family History  Problem Relation Age of Onset  . Cancer Father   . Hyperlipidemia Father   . Osteoarthritis Mother   . Hyperlipidemia Mother     Social History   Tobacco Use  . Smoking status: Never Smoker  . Smokeless tobacco: Never Used  Vaping Use  . Vaping Use: Never used  Substance Use Topics  . Alcohol use: Never  . Drug use: Never    Home Medications Prior to Admission medications   Medication Sig Start Date End Date Taking? Authorizing Provider  acetaminophen (TYLENOL) 500 MG tablet Take 1,000-3,000 mg by mouth See admin instructions. Take 2 tablets in the morning, and  5 tablets at bedtime    [provider]  albuterol (VENTOLIN HFA) 108 (90 Base) MCG/ACT inhaler Inhale 1 puff into the lungs every 6 (six) hours as needed for wheezing or shortness of breath.  07/15/20   Lavina Hamman, MD  allopurinol (ZYLOPRIM) 300 MG tablet Take 300 mg by mouth at bedtime. 08/12/19   [provider]  amLODipine (NORVASC) 5 MG tablet Take 1 tablet (5 mg total) by mouth daily. 07/16/20   Lavina Hamman, MD  aspirin EC 81 MG tablet Take 81 mg by mouth daily.    [provider]  carvedilol (COREG) 6.25 MG tablet Take 1 tablet (6.25 mg total) by mouth 2 (two) times daily with a meal. 07/15/20   Lavina Hamman, MD  Cinnamon 500 MG TABS Take 2,000 mg by mouth in the morning and at bedtime.    [provider]  cyclobenzaprine (FLEXERIL) 5 MG tablet Take 5 mg by mouth 3 (three) times daily. 06/30/18   [provider]  D3-50 1.25 MG (50000 UT) capsule Take 50,000 Units by mouth every Monday. 08/08/19   [provider]  dicyclomine (BENTYL) 20 MG tablet Take 1 tablet (20 mg total) by mouth 2 (two) times daily. Patient taking differently: Take 20 mg by mouth 2 (two) times  daily as needed for spasms. 11/16/19   Couture, Cortni S, PA-C  docusate sodium (COLACE) 100 MG capsule Take 200 mg by mouth 2 (two) times daily. 07/14/18   [provider]  estradiol (CLIMARA - DOSED IN MG/24 HR) 0.05 mg/24hr patch Place 0.05 mg onto the skin once a week. 03/24/20   [provider]  fluticasone (FLONASE) 50 MCG/ACT nasal spray Place 1 spray into both nostrils daily as needed for allergies or rhinitis.    [provider]  gemfibrozil (LOPID) 600 MG tablet Take 300-600 mg by mouth See admin instructions. 600 mg in the morning and 300 mg in the evening 09/09/17   [provider]  glimepiride (AMARYL) 4 MG tablet Take 4 mg by mouth daily. 06/15/19   [provider]  HYDROcodone-acetaminophen (NORCO/VICODIN) 5-325 MG tablet Take 1 tablet by mouth every 6 (six) hours as needed for moderate pain or severe pain. 07/15/20   Lavina Hamman, MD  Insulin Aspart Prot & Aspart (NOVOLOG MIX 70/30 FLEXPEN [place]) Inject 45 Units into  the skin daily with supper.    [provider]  INVOKANA 300 MG TABS tablet Take 300 mg by mouth every morning. 06/23/20   [provider]  levothyroxine (SYNTHROID) 150 MCG tablet Take 150 mcg by mouth daily.  07/17/17   [provider]  lurasidone (LATUDA) 20 MG TABS tablet Take 20 mg by mouth at bedtime.    [provider]  magnesium oxide (MAG-OX) 400 (241.3 Mg) MG tablet Take 400 mg by mouth 2 (two) times a day. 11/06/17   [provider]  meclizine (ANTIVERT) 25 MG tablet Take 25 mg by mouth 3 (three) times daily as needed for dizziness. 06/03/19   [provider]  Melatonin 5 MG TABS Take 5 mg by mouth at bedtime.    [provider]  metoCLOPramide (REGLAN) 10 MG tablet Take 1 tablet (10 mg total) by mouth every 6 (six) hours as needed for nausea or vomiting. 11/07/19   Molpus, John, MD  Multiple Vitamin (MULTIVITAMIN WITH MINERALS) TABS Take 1 tablet by mouth daily.    [provider]  pantoprazole (PROTONIX) 40 MG tablet Take 40 mg by mouth daily.    [provider]  polyethylene glycol (MIRALAX / GLYCOLAX) 17 g packet Take 17 g by mouth daily. 07/16/20   Lavina Hamman, MD  pregabalin (LYRICA) 150 MG capsule Take 1 capsule (150 mg total) by mouth daily. 07/15/20   Lavina Hamman, MD  promethazine (PHENERGAN) 25 MG tablet Take 25 mg by mouth every 8 (eight) hours as needed for nausea or vomiting. 06/02/19   [provider]  sertraline (ZOLOFT) 50 MG tablet Take 50 mg by mouth daily. 07/09/20   [provider]  sodium bicarbonate 650 MG tablet Take 1 tablet (650 mg total) by mouth 3 (three) times daily. 07/15/20   Lavina Hamman, MD  vitamin C (ASCORBIC ACID) 500 MG tablet Take 500 mg by mouth daily.    [provider]    Allergies    Fish oil, Iodinated diagnostic agents, Other, Phenylephrine-guaifenesin, Propoxyphene, Hydromorphone hcl, Abilify [aripiprazole], Bee venom, Dhea [nutritional  supplements], Dihydroergotamine, Divalproex sodium, Doxycycline, Erythromycin, Indomethacin, Lactose intolerance (gi), Risperidone and related, Shellfish allergy, Statins, Tape, Dihydroergotamine, Lactose intolerance (gi), Nsaids, Sulfa antibiotics, and Tape  Review of Systems   Review of Systems  All other systems reviewed and are negative.   Physical Exam Updated Vital Signs BP 119/82   Pulse 92  Temp 97.9 F (36.6 C) (Oral)   Resp 18   Ht '5\' 8"'$  (1.727 m)   Wt 78.7 kg   LMP  (LMP Unknown)   SpO2 98%   BMI 26.38 kg/m   Physical Exam Vitals and nursing note reviewed.   Obese 45 year old female, resting comfortably and in no acute distress. Vital signs are normal. Oxygen saturation is 98%, which is normal. Head is normocephalic and atraumatic. PERRLA, EOMI. Oropharynx is clear. Neck is nontender and supple without adenopathy or JVD. Back is nontender and there is no CVA tenderness.  There is 1+ presacral edema Lungs are clear without rales, wheezes, or rhonchi. Chest is nontender. Heart has regular rate and rhythm without murmur. Abdomen is soft, flat, nontender without masses or hepatosplenomegaly and peristalsis is normoactive. Extremities have 1+ pedal edema, full range of motion is present. Skin is warm and dry without rash. Neurologic: Mental status is normal, cranial nerves are intact, there are no motor or sensory deficits.  ED Results / Procedures / Treatments   Labs (all labs ordered are listed, but only abnormal results are displayed) Labs Reviewed  BASIC METABOLIC PANEL - Abnormal; Notable for the following components:      Result Value   CO2 18 (*)    Glucose, Bld 140 (*)    BUN 71 (*)    Creatinine, Ser 2.91 (*)    GFR, Estimated 20 (*)    All other components within normal limits  CBC - Abnormal; Notable for the following components:   RBC 3.41 (*)    Hemoglobin 10.9 (*)    HCT 29.4 (*)    MCHC 37.1 (*)    All other components within normal limits   D-DIMER, QUANTITATIVE - Abnormal; Notable for the following components:   D-Dimer, Quant 0.60 (*)    All other components within normal limits  BRAIN NATRIURETIC PEPTIDE    EKG EKG Interpretation  Date/Time:  Tuesday July 25 2020 22:08:58 EDT Ventricular Rate:  89 PR Interval:  187 QRS Duration: 89 QT Interval:  374 QTC Calculation: 456 R Axis:   28 Text Interpretation: Sinus rhythm RSR' in V1 or V2, probably normal variant Borderline T abnormalities, anterior leads When compared with ECG of 07/14/2020, No significant change was found Confirmed by Delora Fuel ([id number]) on 07/25/2020 11:47:29 PM   Radiology DG Chest 2 View  Result Date: 07/25/2020 CLINICAL DATA:  Shortness of breath. Stage III kidney failure. Pleurisy. EXAM: CHEST - 2 VIEW COMPARISON:  07/12/2020 FINDINGS: Shallow inspiration with linear atelectasis in the left base. Heart size and pulmonary vascularity are normal. No focal consolidation or airspace disease. No pleural effusions. No pneumothorax. Mediastinal contours appear intact. Similar appearance to previous study. IMPRESSION: Shallow inspiration with linear atelectasis in the left base. Electronically Signed   By: Lucienne Capers M.D.   On: 07/25/2020 22:57    Procedures Procedures   Medications Ordered in ED Medications - No data to display  ED Course  I have reviewed the triage vital signs and the nursing notes.  Pertinent labs & imaging results that were available during my care of the patient were reviewed by me and considered in my medical decision making (see chart for details).  MDM Rules/Calculators/A&P Dyspnea in the setting of worsening renal function.  Labs drawn today show creatinine 2.91 which is a slight increase from hospital discharge when creatinine was 2.72 on 3/26.  Anemia is present and unchanged from baseline.  Chest x-ray is read by radiologist as  showing shallow inspiration with linear atelectasis, but I feel that there might be some  interstitial edema present.  ECG is unchanged from baseline.  Will check BNP.  BNP is normal.  D-dimer was sent and is mildly elevated.  I am concerned that her symptoms could certainly be related to pulmonary embolism given sharp chest pain and shortness of breath.  She cannot have CT angiogram because of poor renal function.  Lung scan is ordered.  She will need to be transferred to [facility] to have that done.  Also, we will try single dose of furosemide since she does appear to be slightly fluid overloaded.  Case has been discussed with Dr. Stark Jock, ED physician at [facility] who agrees to accept the patient.  Final Clinical Impression(s) / ED Diagnoses Final diagnoses:  Shortness of breath  Nonspecific chest pain  Renal insufficiency  Elevated d-dimer    Rx / DC Orders ED Discharge Orders    None       Delora Fuel, MD [id number]

## 2020-07-25 NOTE — ED Triage Notes (Signed)
States she has stage 3 kidney failure and pleurisy. States she is SOB. EMS states when they arrived her sats were 95% RA she was placed on 2L [place] with sats of 97%. She is deaf but reads lips.

## 2020-07-26 ENCOUNTER — Emergency Department (HOSPITAL_BASED_OUTPATIENT_CLINIC_OR_DEPARTMENT_OTHER): Payer: Medicare Other

## 2020-07-26 ENCOUNTER — Emergency Department (HOSPITAL_COMMUNITY): Payer: Medicare Other

## 2020-07-26 DIAGNOSIS — E039 Hypothyroidism, unspecified: Secondary | ICD-10-CM | POA: Diagnosis not present

## 2020-07-26 DIAGNOSIS — Z7984 Long term (current) use of oral hypoglycemic drugs: Secondary | ICD-10-CM | POA: Diagnosis not present

## 2020-07-26 DIAGNOSIS — R0602 Shortness of breath: Secondary | ICD-10-CM | POA: Diagnosis not present

## 2020-07-26 DIAGNOSIS — R0789 Other chest pain: Secondary | ICD-10-CM | POA: Diagnosis not present

## 2020-07-26 DIAGNOSIS — Z7982 Long term (current) use of aspirin: Secondary | ICD-10-CM | POA: Diagnosis not present

## 2020-07-26 DIAGNOSIS — I350 Nonrheumatic aortic (valve) stenosis: Secondary | ICD-10-CM

## 2020-07-26 DIAGNOSIS — Z79899 Other long term (current) drug therapy: Secondary | ICD-10-CM | POA: Diagnosis not present

## 2020-07-26 DIAGNOSIS — E1122 Type 2 diabetes mellitus with diabetic chronic kidney disease: Secondary | ICD-10-CM | POA: Diagnosis not present

## 2020-07-26 DIAGNOSIS — Z9013 Acquired absence of bilateral breasts and nipples: Secondary | ICD-10-CM | POA: Diagnosis not present

## 2020-07-26 DIAGNOSIS — R609 Edema, unspecified: Secondary | ICD-10-CM | POA: Diagnosis not present

## 2020-07-26 DIAGNOSIS — I129 Hypertensive chronic kidney disease with stage 1 through stage 4 chronic kidney disease, or unspecified chronic kidney disease: Secondary | ICD-10-CM | POA: Diagnosis not present

## 2020-07-26 DIAGNOSIS — N183 Chronic kidney disease, stage 3 unspecified: Secondary | ICD-10-CM | POA: Diagnosis not present

## 2020-07-26 DIAGNOSIS — Z794 Long term (current) use of insulin: Secondary | ICD-10-CM | POA: Diagnosis not present

## 2020-07-26 DIAGNOSIS — I361 Nonrheumatic tricuspid (valve) insufficiency: Secondary | ICD-10-CM | POA: Diagnosis not present

## 2020-07-26 DIAGNOSIS — N178 Other acute kidney failure: Secondary | ICD-10-CM | POA: Diagnosis not present

## 2020-07-26 DIAGNOSIS — R791 Abnormal coagulation profile: Secondary | ICD-10-CM | POA: Diagnosis not present

## 2020-07-26 LAB — D-DIMER, QUANTITATIVE: D-Dimer, Quant: 0.6 ug/mL-FEU — ABNORMAL HIGH (ref 0.00–0.50)

## 2020-07-26 LAB — ECHOCARDIOGRAM COMPLETE
Area-P 1/2: 3.79 cm2
Calc EF: 63.4 %
Height: 68 in
P 1/2 time: 407 msec
S' Lateral: 3.6 cm
Single Plane A2C EF: 63.4 %
Single Plane A4C EF: 64.8 %
Weight: 2776.03 oz

## 2020-07-26 LAB — BRAIN NATRIURETIC PEPTIDE: B Natriuretic Peptide: 50.8 pg/mL (ref 0.0–100.0)

## 2020-07-26 MED ORDER — FUROSEMIDE 10 MG/ML IJ SOLN
40.0000 mg | Freq: Once | INTRAMUSCULAR | Status: AC
  Administered 2020-07-26: 40 mg via INTRAVENOUS
  Filled 2020-07-26: qty 4

## 2020-07-26 MED ORDER — FUROSEMIDE 40 MG PO TABS: 40.0000 mg | ORAL_TABLET | Freq: Every day | ORAL | 0 refills | Status: AC

## 2020-07-26 MED ORDER — FUROSEMIDE 20 MG PO TABS
40.0000 mg | ORAL_TABLET | Freq: Once | ORAL | Status: AC
  Administered 2020-07-26: 40 mg via ORAL
  Filled 2020-07-26: qty 2

## 2020-07-26 MED ORDER — TECHNETIUM TO 99M ALBUMIN AGGREGATED
4.1000 | Freq: Once | INTRAVENOUS | Status: AC | PRN
  Administered 2020-07-26: 4.1 via INTRAVENOUS

## 2020-07-26 NOTE — ED Provider Notes (Signed)
Patient with history of diabetes, hypertension, and chronic renal insufficiency.  She is sent from [facility] for further work-up and ventilation perfusion scan.  Patient reports increased dyspnea over the past several days with intermittent sharp pains in her chest.  She does have some bilateral leg swelling as well.  She received intravenous Lasix prior to transport.  Patient arrives here with stable vital signs and in no distress.  VQ scan will not be able to be performed until 6 AM.  Patient will remain in the ER until that time.  Patient also tells me that she was seen by Dr. Justin Mend, her nephrologist recently.  He is concerned about her recent increase in her creatinine.  Patient tells me that Dr. Justin Mend wanted her to come to the hospital to be admitted for a renal biopsy to further evaluation of her worsening renal function.  Once the VQ scan has been performed, nephrology will be contacted.  Care to be signed out to oncoming provider at shift change.   Veryl Speak, MD 07/26/20 [id number]

## 2020-07-26 NOTE — ED Notes (Addendum)
Pt here from [facility] via [facility] for VQ scan in morning for PE rule out. Pt has increased SOB over past week. Currently on 2L [place] for comfort. Pt is deaf, has cochlear implants and reads lips well.

## 2020-07-26 NOTE — ED Provider Notes (Addendum)
Signed out by Dr Stark Jock to d/c to home if/when VQ neg for PE.  VQ is negative for PE.  Pt is breathing comfortably, o2 sats 99%.   [facility] on call called as pt had indicated possible plan for admission for renal biopsy.   I discussed pt, labs, presentation, etc, with [facility] doc on call - she took time to review record and spoke with Dr Justin Mend, and indicates no plan for admission for biopsy, that they are considering possible outpatient biopsy in future, and that they request we currently start on lasix 40 mg a day, hold amlodipine, and d/c to follow up in ~ 1 week.  Plan communicated to patient.   Pt currently appears stable for d/c.  Return precautions provided.   Dr Justin Mend subsequently came to ED to see pt - he request add echo to workup (as last echo was a few years ago), and reaffirms plan for d/c home, outpt f/u,  hold/stop amlodipine, lasix 40 mg a day (if/when echo ok).   ECHO ordered as requested by Dr Justin Mend.  ECHO results - no acute process, no pericardial effusion or other acute abn.  Recheck pt, hungry/hasn't eaten - provided food/drink. No increased wob. Room air pulse ox 98%. No increased wob. No current cp or discomfort.        Lajean Saver, MD 07/26/20 1300

## 2020-07-26 NOTE — Progress Notes (Signed)
  Echocardiogram 2D Echocardiogram has been performed.  Brandi Love 07/26/2020, 12:29 PM

## 2020-07-26 NOTE — Discharge Instructions (Addendum)
It was our pleasure to provide your ER care today - we hope that you feel better.  We discussed your case with your kidney doctors - they indicate they are not planning to admit you to the hospital for a kidney biopsy - they indicate they are considering whether or not to arranged an outpatient kidney biopsy in the coming weeks.  For now, they want you to start take lasix as prescribed, and to hold/stop taking the amlodipine, and to follow up with them in one week - contact office to arrange appointment time.  Discuss ongoing plan for medications at that time.   Return to ER if worse, new symptoms, fevers, new or severe pain, increased trouble breathing, or other concern.

## 2020-07-26 NOTE — ED Notes (Signed)
Pt transported to [facility] for Perfusion Study.

## 2020-09-07 ENCOUNTER — Other Ambulatory Visit: Payer: Self-pay | Admitting: Nephrology

## 2020-09-07 DIAGNOSIS — N184 Chronic kidney disease, stage 4 (severe): Secondary | ICD-10-CM

## 2020-09-28 ENCOUNTER — Ambulatory Visit
Admission: RE | Admit: 2020-09-28 | Discharge: 2020-09-28 | Disposition: A | Discharge status: Home / Self Care | Payer: Medicare Other | Source: Ambulatory Visit | Attending: Nephrology | Admitting: Nephrology

## 2020-09-28 DIAGNOSIS — N184 Chronic kidney disease, stage 4 (severe): Secondary | ICD-10-CM

## 2020-11-03 ENCOUNTER — Other Ambulatory Visit: Payer: Self-pay

## 2020-11-03 ENCOUNTER — Emergency Department (HOSPITAL_BASED_OUTPATIENT_CLINIC_OR_DEPARTMENT_OTHER)
Admission: EM | Admit: 2020-11-03 | Discharge: 2020-11-03 | Disposition: A | Discharge status: Home / Self Care | Payer: Medicare Other | Attending: Emergency Medicine | Admitting: Emergency Medicine

## 2020-11-03 ENCOUNTER — Emergency Department (HOSPITAL_BASED_OUTPATIENT_CLINIC_OR_DEPARTMENT_OTHER): Payer: Medicare Other

## 2020-11-03 ENCOUNTER — Encounter (HOSPITAL_BASED_OUTPATIENT_CLINIC_OR_DEPARTMENT_OTHER): Payer: Self-pay | Admitting: *Deleted

## 2020-11-03 DIAGNOSIS — Z79899 Other long term (current) drug therapy: Secondary | ICD-10-CM | POA: Insufficient documentation

## 2020-11-03 DIAGNOSIS — E875 Hyperkalemia: Secondary | ICD-10-CM | POA: Diagnosis not present

## 2020-11-03 DIAGNOSIS — E039 Hypothyroidism, unspecified: Secondary | ICD-10-CM | POA: Insufficient documentation

## 2020-11-03 DIAGNOSIS — E1122 Type 2 diabetes mellitus with diabetic chronic kidney disease: Secondary | ICD-10-CM | POA: Diagnosis not present

## 2020-11-03 DIAGNOSIS — Z794 Long term (current) use of insulin: Secondary | ICD-10-CM | POA: Diagnosis not present

## 2020-11-03 DIAGNOSIS — N183 Chronic kidney disease, stage 3 unspecified: Secondary | ICD-10-CM | POA: Insufficient documentation

## 2020-11-03 DIAGNOSIS — K219 Gastro-esophageal reflux disease without esophagitis: Secondary | ICD-10-CM | POA: Diagnosis not present

## 2020-11-03 DIAGNOSIS — R109 Unspecified abdominal pain: Secondary | ICD-10-CM | POA: Diagnosis not present

## 2020-11-03 DIAGNOSIS — Z7982 Long term (current) use of aspirin: Secondary | ICD-10-CM | POA: Diagnosis not present

## 2020-11-03 DIAGNOSIS — I129 Hypertensive chronic kidney disease with stage 1 through stage 4 chronic kidney disease, or unspecified chronic kidney disease: Secondary | ICD-10-CM | POA: Insufficient documentation

## 2020-11-03 LAB — CBC WITH DIFFERENTIAL/PLATELET
Abs Immature Granulocytes: 0.05 10*3/uL (ref 0.00–0.07)
Basophils Absolute: 0.1 10*3/uL (ref 0.0–0.1)
Basophils Relative: 1 %
Eosinophils Absolute: 0.3 10*3/uL (ref 0.0–0.5)
Eosinophils Relative: 4 %
HCT: 32.8 % — ABNORMAL LOW (ref 36.0–46.0)
Hemoglobin: 11.3 g/dL — ABNORMAL LOW (ref 12.0–15.0)
Immature Granulocytes: 1 %
Lymphocytes Relative: 25 %
Lymphs Abs: 2.2 10*3/uL (ref 0.7–4.0)
MCH: 30.6 pg (ref 26.0–34.0)
MCHC: 34.5 g/dL (ref 30.0–36.0)
MCV: 88.9 fL (ref 80.0–100.0)
Monocytes Absolute: 0.6 10*3/uL (ref 0.1–1.0)
Monocytes Relative: 7 %
Neutro Abs: 5.6 10*3/uL (ref 1.7–7.7)
Neutrophils Relative %: 62 %
Platelets: 233 10*3/uL (ref 150–400)
RBC: 3.69 MIL/uL — ABNORMAL LOW (ref 3.87–5.11)
RDW: 14.9 % (ref 11.5–15.5)
WBC: 8.9 10*3/uL (ref 4.0–10.5)
nRBC: 0 % (ref 0.0–0.2)

## 2020-11-03 LAB — URINALYSIS, MICROSCOPIC (REFLEX)

## 2020-11-03 LAB — URINALYSIS, ROUTINE W REFLEX MICROSCOPIC
Bilirubin Urine: NEGATIVE
Glucose, UA: >=500 mg/dL — AB
Ketones, ur: NEGATIVE mg/dL
Leukocytes,Ua: NEGATIVE
Nitrite: NEGATIVE
Protein, ur: 100 mg/dL — AB
Specific Gravity, Urine: 1.015 (ref 1.005–1.030)
pH: 5.5 (ref 5.0–8.0)

## 2020-11-03 LAB — BASIC METABOLIC PANEL
Anion gap: 12 (ref 5–15)
BUN: 58 mg/dL — ABNORMAL HIGH (ref 6–20)
CO2: 18 mmol/L — ABNORMAL LOW (ref 22–32)
Calcium: 9.3 mg/dL (ref 8.9–10.3)
Chloride: 103 mmol/L (ref 98–111)
Creatinine, Ser: 2.71 mg/dL — ABNORMAL HIGH (ref 0.44–1.00)
GFR, Estimated: 22 mL/min — ABNORMAL LOW (ref >60)
Glucose, Bld: 166 mg/dL — ABNORMAL HIGH (ref 70–99)
Potassium: 5.4 mmol/L — ABNORMAL HIGH (ref 3.5–5.1)
Sodium: 133 mmol/L — ABNORMAL LOW (ref 135–145)

## 2020-11-03 MED ORDER — MORPHINE SULFATE (PF) 4 MG/ML IV SOLN
4.0000 mg | Freq: Once | INTRAVENOUS | Status: AC
  Administered 2020-11-03: 4 mg via INTRAVENOUS
  Filled 2020-11-03: qty 1

## 2020-11-03 MED ORDER — ONDANSETRON HCL 4 MG/2ML IJ SOLN
4.0000 mg | Freq: Once | INTRAMUSCULAR | Status: AC
  Administered 2020-11-03: 4 mg via INTRAVENOUS
  Filled 2020-11-03: qty 2

## 2020-11-03 MED ORDER — CEPHALEXIN 500 MG PO CAPS: 500.0000 mg | ORAL_CAPSULE | Freq: Four times a day (QID) | ORAL | 0 refills | Status: DC

## 2020-11-03 MED ORDER — SODIUM CHLORIDE 0.9 % IV SOLN
1.0000 g | Freq: Once | INTRAVENOUS | Status: AC
  Administered 2020-11-03: 1 g via INTRAVENOUS
  Filled 2020-11-03: qty 10

## 2020-11-03 MED ORDER — SODIUM CHLORIDE 0.9 % IV BOLUS
500.0000 mL | Freq: Once | INTRAVENOUS | Status: AC
  Administered 2020-11-03: 500 mL via INTRAVENOUS

## 2020-11-03 MED ORDER — SODIUM ZIRCONIUM CYCLOSILICATE 5 G PO PACK
5.0000 g | PACK | Freq: Once | ORAL | Status: AC
  Administered 2020-11-03: 5 g via ORAL
  Filled 2020-11-03: qty 1

## 2020-11-03 NOTE — ED Provider Notes (Signed)
[facility] EMERGENCY DEPARTMENT Provider Note   CSN: [id number] Arrival date & time: 11/03/20  1420     History Chief Complaint  Patient presents with   Flank Pain    Brandi Love is a 45 y.o. female.  Brandi Love is a 45 y.o. female with history of GERD, hyperlipidemia, CKD, PE, seizures, OCD, deafness with cochlear implant in place, who presents to the emergency department for evaluation of bilateral flank pain.  Patient reports she started having pain over both of her flanks yesterday.  Reports that she is also having foul-smelling urine and has had some mild dysuria.  Patient has not noted any hematuria but has not specifically looked for this.  Patient reports urinary symptoms have been present for at least the last 3 to 4 days.  She denies any fevers or chills.  No nausea or vomiting.  No anterior abdominal pain.  Reports remote history of kidney stones, and she is not sure if this feels similar.  She reports she has chronic kidney disease and is followed by Dr. Justin Mend with [facility] and she spoke to him regarding the symptoms and he encouraged her to come in for evaluation.  The history is provided by the patient.      Past Medical History:  Diagnosis Date   Anemia    Anorexia    Anxiety    Arthritis    "hands" (09/18/2016)   Chronic kidney disease    Chronic lower back pain    Chronic renal insufficiency    Deaf    Deafness    Depression    Diabetes ([place])    "borderline"   Eating disorder    Endometriosis    GERD (gastroesophageal reflux disease)    History of kidney stones    Hyperlipidemia    Hypothyroidism    "born without a thyroid gland"   MDD (major depressive disorder)    Migraine    "frequency varies" (09/18/2016)   OCD (obsessive compulsive disorder)    Pneumonia 2011   PONV (postoperative nausea and vomiting)    Pulmonary embolism ([place]) 10/2001   Seizures ([place]) 09/2001 X 1   "day after my hysterectomy"   Seizures ([place])    Self  mutilating behavior    Tachycardia     Patient Active Problem List   Diagnosis Date Noted   Acute kidney injury superimposed on chronic kidney disease ([place]) 07/10/2020   Hyperkalemia 07/10/2020   Deaf 10/01/2018   Hematochezia 10/01/2018   Hypertension associated with diabetes ([place]) 10/01/2018   Loss of appetite 10/01/2018   Mental health problem 10/01/2018   Seizures ([place]) 10/01/2018   Abdominal pain 07/02/2018   Gastroesophageal reflux disease without esophagitis 02/16/2018   Primary insomnia 02/16/2018   Prediabetes 01/28/2018   Insulin dependent type 2 diabetes mellitus ([place]) 01/28/2018   Syncope 08/26/2017   Acute renal failure superimposed on stage 3 chronic kidney disease ([place]) 08/26/2017   Orthostatic syncope    Self mutilating behavior 08/08/2017   Acne 06/11/2017   Self-care deficit for bathing 06/11/2017   Urinary tract infection with hematuria    OCD (obsessive compulsive disorder) 09/29/2016   Anorexia nervosa with bulimia 09/29/2016   Deafness congenital 09/29/2016   Intractable nausea and vomiting 09/29/2016   Bloody diarrhea 09/29/2016   AKI (acute kidney injury) ([place]) 09/29/2016   Severe dehydration 09/29/2016   Severe protein-calorie malnutrition ([place]) 09/29/2016   Congenital hypothyroidism 09/29/2016   Hypertriglyceridemia 09/29/2016   Anemia 09/29/2016   Endometriosis  09/29/2016   MDD (major depressive disorder) 09/29/2016   Hyperlipidemia 09/29/2016   CKD (chronic kidney disease), stage III ([place]) 09/29/2016   Acute kidney injury ([place]) 09/29/2016   OAB (overactive bladder) 09/29/2016   Sepsis ([place]) 09/18/2016   Acute pyelonephritis 09/18/2016   Urinary incontinence 08/09/2015   MDD (major depressive disorder), recurrent, severe, with psychosis ([place]) 12/11/2014   Severe recurrent major depressive disorder with psychotic symptoms ([place]) 12/11/2014   Migraine 07/02/2013   Eating disorder, unspecified 11/23/2012   Hypothyroidism 11/23/2012    Hyperlipidemia associated with type 2 diabetes mellitus ([place]) 11/23/2012   Sinus tachycardia 11/23/2012   CKD (chronic kidney disease) stage 3, GFR 30-59 ml/min (H[facility]) 11/18/2012   Protein-calorie malnutrition, severe ([place]) 11/13/2012   Abdominal pain, epigastric 11/12/2012   Dehydration 11/12/2012   Acute on chronic renal failure ([place]) 11/12/2012   Leukocytosis, unspecified 11/12/2012   Hyponatremia 11/12/2012   Borderline personality disorder ([place]) 11/12/2012    Past Surgical History:  Procedure Laterality Date   ABDOMINAL HYSTERECTOMY  09/2001   'except for right ovary'   BREAST LUMPECTOMY Left 2000   benign   BREAST LUMPECTOMY Right 2002   benign   COCHLEAR IMPLANT Bilateral 2004-2010   "right-left"   COLONOSCOPY  06/2015    Dr Rolm Bookbinder at [facility] for rectal bleeding: internal hemorrhoids, small anal fissure.     ESOPHAGOGASTRODUODENOSCOPY N/A 10/07/2016   Procedure: ESOPHAGOGASTRODUODENOSCOPY (EGD);  Surgeon: Ladene Artist, MD;  Location: [facility] ENDOSCOPY;  Service: Endoscopy;  Laterality: N/A;   EYE MUSCLE SURGERY Left 1980   "lazy eye"   EYE MUSCLE SURGERY Right 1990   "lazy eye"   LAPAROSCOPIC ABDOMINAL EXPLORATION  2001 and 2002   for endometriosis   OOPHORECTOMY Right 08/2019     OB History   No obstetric history on file.     Family History  Problem Relation Age of Onset   Cancer Father    Hyperlipidemia Father    Osteoarthritis Mother    Hyperlipidemia Mother     Social History   Tobacco Use   Smoking status: Never   Smokeless tobacco: Never  Vaping Use   Vaping Use: Never used  Substance Use Topics   Alcohol use: Never   Drug use: Never    Home Medications Prior to Admission medications   Medication Sig Start Date End Date Taking? Authorizing Provider  cephALEXin (KEFLEX) 500 MG capsule Take 1 capsule (500 mg total) by mouth 4 (four) times daily. 11/03/20  Yes Jacqlyn Larsen, PA-C  acetaminophen (TYLENOL) 500 MG tablet Take 1,000-3,000  mg by mouth See admin instructions. Take 2 tablets in the morning, and  5 tablets at bedtime    [provider]  albuterol (VENTOLIN HFA) 108 (90 Base) MCG/ACT inhaler Inhale 1 puff into the lungs every 6 (six) hours as needed for wheezing or shortness of breath. 07/15/20   Lavina Hamman, MD  allopurinol (ZYLOPRIM) 300 MG tablet Take 300 mg by mouth at bedtime. 08/12/19   [provider]  aspirin EC 81 MG tablet Take 81 mg by mouth daily.    [provider]  carvedilol (COREG) 6.25 MG tablet Take 1 tablet (6.25 mg total) by mouth 2 (two) times daily with a meal. 07/15/20   Lavina Hamman, MD  Cinnamon 500 MG TABS Take 2,000 mg by mouth in the morning and at bedtime.    [provider]  cyclobenzaprine (FLEXERIL) 5 MG tablet Take 5 mg by mouth 3 (three) times daily.  06/30/18   [provider]  D3-50 1.25 MG (50000 UT) capsule Take 50,000 Units by mouth every Monday. 08/08/19   [provider]  dicyclomine (BENTYL) 20 MG tablet Take 1 tablet (20 mg total) by mouth 2 (two) times daily. Patient taking differently: Take 20 mg by mouth 2 (two) times daily as needed for spasms. 11/16/19   Couture, Cortni S, PA-C  docusate sodium (COLACE) 100 MG capsule Take 200 mg by mouth 2 (two) times daily. 07/14/18   [provider]  estradiol (CLIMARA - DOSED IN MG/24 HR) 0.05 mg/24hr patch Place 0.05 mg onto the skin once a week. 03/24/20   [provider]  fluticasone (FLONASE) 50 MCG/ACT nasal spray Place 1 spray into both nostrils daily as needed for allergies or rhinitis.    [provider]  furosemide (LASIX) 40 MG tablet Take 1 tablet (40 mg total) by mouth daily. 07/26/20   Lajean Saver, MD  gemfibrozil (LOPID) 600 MG tablet Take 300-600 mg by mouth See admin instructions. 600 mg in the morning and 300 mg in the evening 09/09/17   [provider]  glimepiride (AMARYL) 4 MG tablet Take 4 mg by mouth daily. 06/15/19   [provider]  HYDROcodone-acetaminophen (NORCO/VICODIN) 5-325 MG tablet Take 1 tablet by mouth every 6 (six) hours as needed for moderate pain or severe pain. 07/15/20   Lavina Hamman, MD  Insulin Aspart Prot & Aspart (NOVOLOG MIX 70/30 FLEXPEN ) Inject 45 Units into the skin daily with supper.    [provider]  INVOKANA 300 MG TABS tablet Take 300 mg by mouth every morning. 06/23/20   [provider]  levothyroxine (SYNTHROID) 150 MCG tablet Take 150 mcg by mouth daily.  07/17/17   [provider]  lurasidone (LATUDA) 20 MG TABS tablet Take 20 mg by mouth at bedtime.    [provider]  magnesium oxide (MAG-OX) 400 (241.3 Mg) MG tablet Take 400 mg by mouth 2 (two) times a day. 11/06/17   [provider]  meclizine (ANTIVERT) 25 MG tablet Take 25 mg by mouth 3 (three) times daily as needed for dizziness. 06/03/19   [provider]  Melatonin 5 MG TABS Take 5 mg by mouth at bedtime.    [provider]  metoCLOPramide (REGLAN) 10 MG tablet Take 1 tablet (10 mg total) by mouth every 6 (six) hours as needed for nausea or vomiting. 11/07/19   Molpus, John, MD  Multiple Vitamin (MULTIVITAMIN WITH MINERALS) TABS Take 1 tablet by mouth daily.    [provider]  pantoprazole (PROTONIX) 40 MG tablet Take 40 mg by mouth daily.    [provider]  polyethylene glycol (MIRALAX / GLYCOLAX) 17 g packet Take 17 g by mouth daily. 07/16/20   Lavina Hamman, MD  pregabalin (LYRICA) 150 MG capsule Take 1 capsule (150 mg total) by mouth daily. 07/15/20   Lavina Hamman, MD  promethazine (PHENERGAN) 25 MG tablet Take 25 mg by mouth every 8 (eight) hours as needed for nausea or vomiting. 06/02/19   [provider]  sertraline (ZOLOFT) 50 MG tablet Take 50 mg by mouth daily. 07/09/20   [provider]  sodium bicarbonate 650 MG tablet Take 1 tablet (650 mg total) by mouth 3 (three) times daily. 07/15/20   Lavina Hamman, MD   vitamin C (ASCORBIC ACID) 500 MG tablet Take 500 mg by mouth daily.    [provider]  amLODipine (NORVASC) 5  MG tablet Take 1 tablet (5 mg total) by mouth daily. 07/16/20 07/26/20  Lavina Hamman, MD    Allergies    Fish oil, Iodinated diagnostic agents, Other, Phenylephrine-guaifenesin, Propoxyphene, Hydromorphone hcl, Abilify [aripiprazole], Bee venom, Dhea [nutritional supplements], Dihydroergotamine, Divalproex sodium, Doxycycline, Erythromycin, Indomethacin, Lactose intolerance (gi), Risperidone and related, Shellfish allergy, Statins, Tape, Dihydroergotamine, Lactose intolerance (gi), Nsaids, Sulfa antibiotics, and Tape  Review of Systems   Review of Systems  Constitutional:  Negative for chills and fever.  HENT: Negative.    Respiratory:  Negative for cough and shortness of breath.   Cardiovascular:  Negative for chest pain.  Gastrointestinal:  Negative for abdominal pain, diarrhea, nausea and vomiting.  Genitourinary:  Positive for dysuria, flank pain and frequency. Negative for hematuria, vaginal bleeding and vaginal discharge.  Musculoskeletal:  Negative for arthralgias and myalgias.  Skin:  Negative for color change and rash.  All other systems reviewed and are negative.  Physical Exam Updated Vital Signs BP (!) 149/88   Pulse 87   Temp 98.4 F (36.9 C)   Resp 20   Ht '5\' 8"'$  (1.727 m)   Wt 81.6 kg   LMP  (LMP Unknown)   SpO2 95%   BMI 27.37 kg/m   Physical Exam Vitals and nursing note reviewed.  Constitutional:      General: She is not in acute distress.    Appearance: Normal appearance. She is well-developed. She is not diaphoretic.  HENT:     Head: Normocephalic and atraumatic.  Eyes:     General:        Right eye: No discharge.        Left eye: No discharge.  Cardiovascular:     Rate and Rhythm: Normal rate and regular rhythm.     Pulses: Normal pulses.     Heart sounds: Normal heart sounds.  Pulmonary:     Effort: Pulmonary effort is normal.  No respiratory distress.     Breath sounds: Normal breath sounds. No wheezing or rales.     Comments: Respirations equal and unlabored, patient able to speak in full sentences, lungs clear to auscultation bilaterally  Abdominal:     General: Bowel sounds are normal. There is no distension.     Palpations: Abdomen is soft. There is no mass.     Tenderness: There is no abdominal tenderness. There is right CVA tenderness and left CVA tenderness. There is no guarding.     Comments: Abdomen soft, nondistended, nontender to palpation in all quadrants without guarding or peritoneal signs  Musculoskeletal:        General: No deformity.     Cervical back: Neck supple.  Skin:    General: Skin is warm and dry.     Capillary Refill: Capillary refill takes less than 2 seconds.  Neurological:     Mental Status: She is alert and oriented to person, place, and time.     Coordination: Coordination normal.     Comments: Speech is clear, able to follow commands Moves extremities without ataxia, coordination intact  Psychiatric:        Mood and Affect: Mood normal.        Behavior: Behavior normal.    ED Results / Procedures / Treatments   Labs (all labs ordered are listed, but only abnormal results are displayed) Labs Reviewed  URINALYSIS, ROUTINE W REFLEX MICROSCOPIC - Abnormal; Notable for the following components:      Result Value   Glucose, UA >=500 (*)  Hgb urine dipstick TRACE (*)    Protein, ur 100 (*)    All other components within normal limits  BASIC METABOLIC PANEL - Abnormal; Notable for the following components:   Sodium 133 (*)    Potassium 5.4 (*)    CO2 18 (*)    Glucose, Bld 166 (*)    BUN 58 (*)    Creatinine, Ser 2.71 (*)    GFR, Estimated 22 (*)    All other components within normal limits  CBC WITH DIFFERENTIAL/PLATELET - Abnormal; Notable for the following components:   RBC 3.69 (*)    Hemoglobin 11.3 (*)    HCT 32.8 (*)    All other components within normal  limits  URINALYSIS, MICROSCOPIC (REFLEX) - Abnormal; Notable for the following components:   Bacteria, UA RARE (*)    All other components within normal limits  URINE CULTURE    EKG None  Radiology CT Renal Stone Study  Result Date: 11/03/2020 CLINICAL DATA:  Bilateral flank pain for 2 days, urinary frequency, dysuria EXAM: CT ABDOMEN AND PELVIS WITHOUT CONTRAST TECHNIQUE: Multidetector CT imaging of the abdomen and pelvis was performed following the standard protocol without IV contrast. COMPARISON:  11/06/2019, 07/10/2020 FINDINGS: Lower chest: No acute pleural or parenchymal lung disease. Hepatobiliary: Heterogeneous attenuation of the liver consistent with fibrofatty infiltration. Punctate subcentimeter hypodensities within the superior aspect right lobe liver likely reflect small cysts. Evaluation of the liver parenchyma is limited without IV contrast. No intrahepatic duct dilation. The gallbladder is moderately distended without cholelithiasis or cholecystitis. Pancreas: Unremarkable. No pancreatic ductal dilatation or surrounding inflammatory changes. Spleen: Normal in size without focal abnormality. Adrenals/Urinary Tract: No urinary tract calculi or obstructive uropathy within either kidney. Stable small right renal cortical cysts again noted. There is mild bilateral renal cortical atrophy, stable. The adrenals and bladder are unremarkable. Stomach/Bowel: No bowel obstruction or ileus. Normal appendix right lower quadrant. No bowel wall thickening or inflammatory changes. Vascular/Lymphatic: No significant vascular findings are present. No enlarged abdominal or pelvic lymph nodes. Reproductive: Status post hysterectomy. No adnexal masses. Other: No free fluid or free gas.  No abdominal wall hernia. Musculoskeletal: No acute or destructive bony lesions. Reconstructed images demonstrate no additional findings. IMPRESSION: 1. No evidence of urinary tract calculi or obstructive uropathy within  either kidney. 2. Heterogeneous decreased attenuation of the liver which may reflect underlying hepatic steatosis. Punctate subcentimeter liver hypodensities are too small to characterize but likely reflect small cysts. 3. No acute intra-abdominal or intrapelvic process. Electronically Signed   By: Randa Ngo M.D.   On: 11/03/2020 17:53    Procedures Procedures   Medications Ordered in ED Medications  sodium chloride 0.9 % bolus 500 mL (0 mLs Intravenous Stopped 11/03/20 1802)  morphine 4 MG/ML injection 4 mg (4 mg Intravenous Given 11/03/20 1639)  ondansetron (ZOFRAN) injection 4 mg (4 mg Intravenous Given 11/03/20 1639)  morphine 4 MG/ML injection 4 mg (4 mg Intravenous Given 11/03/20 1855)  cefTRIAXone (ROCEPHIN) 1 g in sodium chloride 0.9 % 100 mL IVPB (0 g Intravenous Stopped 11/03/20 1932)  sodium zirconium cyclosilicate (LOKELMA) packet 5 g (5 g Oral Given 11/03/20 1855)    ED Course  I have reviewed the triage vital signs and the nursing notes.  Pertinent labs & imaging results that were available during my care of the patient were reviewed by me and considered in my medical decision making (see chart for details).    MDM Rules/Calculators/A&P  Patient presents to the ED with complaints of bilateral flank pain, patient has also had some dysuria and frequency.  Patient nontoxic appearing, vitals without significant abnormalities, patient noted to be hypertensive. On exam patient is tender over bilateral flanks, no peritoneal signs. DDX: nephrolithiasis, pyelonephritis/UTI, cholecystitis, pancreatitis, bowel obstruction/perforation, appendicitis, dissection, feel nephrolithiasis is most likely at this time will evaluate with labs and CT renal study, analgesics, anti-emetics, and fluids ordered.   I have independently ordered, reviewed and interpreted all labs and imaging: CBC: No leukocytosis, stable hemoglobin BMP: Mild hyperkalemia of 5.4, CO2 18, glucose 166,  renal function is at baseline with creatinine of 2.71, BUN of 58. UA: No leukocytes or nitrites present, patient does have rare bacteria present, will send for culture  CT with no evidence of urinary tract calculi or obstructive uropathy.  Heterogeneous decreased attenuation in the liver may reflect underlying hepatic steatosis.  No acute intra-abdominal or intrapelvic process.  Despite reassuring urinalysis there is rare bacteria present and I still have high clinical concern for pyelonephritis given patient's symptoms, will go ahead and treat with Rocephin and place patient on course of Keflex until urine culture returns.  Discussed mild hyperkalemia with Dr. Johnney Ou with nephrology she recommends giving a one-time 5 g dose of Lokelma and having patient follow a low potassium diet, she reports that seasonal summer vegetables often have higher potassium content and this is quite common.  Patient given dose of IV Rocephin here in the ED, pain is improved.  Given Lokelma and provided information on low potassium diet.  Prescription given for Keflex.  We will have patient continue to use her home pain medication and follow-up with Dr. Justin Mend and her PCP as planned.  She expresses understanding and agreement.  Discharged home in good condition.   Final Clinical Impression(s) / ED Diagnoses Final diagnoses:  Bilateral flank pain  Hyperkalemia    Rx / DC Orders ED Discharge Orders          Ordered    cephALEXin (KEFLEX) 500 MG capsule  4 times daily        11/03/20 2016             Janet Berlin 11/03/20 2058    Dorie Rank, MD 11/05/20 [phone]

## 2020-11-03 NOTE — ED Notes (Signed)
Pt understands and verbalizes discharge instructions. Pt sts she is ordering [employer] ride home.

## 2020-11-03 NOTE — Discharge Instructions (Addendum)
Take antibiotics for potential kidney infection, you will be called by phone if your culture shows that a different antibiotic would be more appropriate.  Your potassium was a bit elevated today, you were given a dose of Lokelma to lower your potassium and should follow the instructions in your paperwork today for a low potassium diet.  Follow-up closely with your kidney doctor.  Return for new or worsening symptoms.

## 2020-11-03 NOTE — ED Triage Notes (Signed)
Brought in by EMS from home, c/o bil flank pain x 2 days ago

## 2020-11-03 NOTE — ED Notes (Signed)
Patient c/o bilateral flank pain x 2 days, hx of kidney stones.  Also foul smelling urine.

## 2020-11-03 NOTE — ED Notes (Signed)
Patient transported to CT 

## 2020-11-05 LAB — URINE CULTURE: Culture: 20000 — AB

## 2020-11-06 ENCOUNTER — Telehealth: Payer: Self-pay | Admitting: *Deleted

## 2020-11-06 NOTE — Telephone Encounter (Signed)
Post ED Visit - Positive Culture Follow-up  Culture report reviewed by antimicrobial stewardship pharmacist: [facility] Team '[]'$  Elenor Quinones, Pharm.D. '[]'$  Heide Guile, Pharm.D., BCPS AQ-ID '[]'$  Parks Neptune, Pharm.D., BCPS '[]'$  Alycia Rossetti, Pharm.D., BCPS '[]'$  [place], Pharm.D., BCPS, AAHIVP '[]'$  Legrand Como, Pharm.D., BCPS, AAHIVP '[]'$  Salome Arnt, PharmD, BCPS '[]'$  Johnnette Gourd, PharmD, BCPS '[]'$  Hughes Better, PharmD, BCPS '[]'$  Leeroy Cha, PharmD '[]'$  Laqueta Linden, PharmD, BCPS '[]'$  Albertina Parr, PharmD  [facility] Team '[]'$  Leodis Sias, PharmD '[]'$  Lindell Spar, PharmD '[]'$  Royetta Asal, PharmD '[]'$  Graylin Shiver, Rph '[]'$  Rema Fendt) Glennon Mac, PharmD '[]'$  Arlyn Dunning, PharmD '[]'$  Netta Cedars, PharmD '[]'$  Dia Sitter, PharmD '[]'$  Leone Haven, PharmD '[]'$  Gretta Arab, PharmD '[]'$  Theodis Shove, PharmD '[]'$  Peggyann Juba, PharmD '[]'$  Reuel Boom, PharmD   Positive urine culture Treated with Cephalexin, organism sensitive to the same and no further patient follow-up is required at this time.  Joetta Manners, PharmD  Harlon Flor Talley 11/06/2020, 11:09 AM

## 2020-11-16 ENCOUNTER — Ambulatory Visit (INDEPENDENT_AMBULATORY_CARE_PROVIDER_SITE_OTHER): Payer: Medicare Other | Admitting: Family Medicine

## 2020-11-16 ENCOUNTER — Encounter: Payer: Self-pay | Admitting: Family Medicine

## 2020-11-16 DIAGNOSIS — M545 Low back pain, unspecified: Secondary | ICD-10-CM

## 2020-11-16 DIAGNOSIS — G8929 Other chronic pain: Secondary | ICD-10-CM | POA: Diagnosis not present

## 2020-11-16 NOTE — Progress Notes (Signed)
Office Visit Note   Patient: Brandi Love           Date of Birth: [birth year]           MRN: [id number] Visit Date: 11/16/2020 Requested by: Bernerd Limbo, MD [address] [place],  [place] [id number] PCP: Bernerd Limbo, MD  Subjective: Chief Complaint  Patient presents with   Lower Back - Pain    Pain across the lower back, constant, since 11/03/20. She went to the ED that day and had renal CT, as the patient thought she had kidney issues. The kidney specialist referred her here for follow up.     HPI: She is here with low back pain.  She is deaf and has an interpreter today.  She has had back problems for a long time.  She has tried physical therapy in the past, and is currently on pain medication which does not seem to help.  Recently the pain got worse, bilateral flank pain, and she went to the ER and had a CT scan.  They were concerned about possible kidney problems but she went to her nephrologist who felt that this was more of a musculoskeletal issue and sent her to [place].  She does have a history of chronic kidney disease.  She has diabetes and hypertension, congenital hypothyroidism.                ROS:   All other systems were reviewed and are negative.  Objective: Vital Signs: LMP  (LMP Unknown)   Physical Exam:  General:  Alert and oriented, in no acute distress. Pulm:  Breathing unlabored. Psy:  Normal mood, congruent affect. Skin: No rash Low back: She has tightness and tenderness in the bilateral paraspinous muscles near the L2-3 level.  No significant bony tenderness over the spinous processes.  Negative straight leg raise, lower extremity strength and reflexes are normal.    Imaging: No results found.  Assessment & Plan: Chronic bilateral low back pain, probably myofascial.  Neurologic exam is nonfocal. -She cannot have MRI scan.  We will try physical therapy to [facility].  No new medications given. - Consider trying an elimination  diet.     Procedures: No procedures performed        PMFS History: Patient Active Problem List   Diagnosis Date Noted   Acute kidney injury superimposed on chronic kidney disease ([place]) 07/10/2020   Hyperkalemia 07/10/2020   Deaf 10/01/2018   Hematochezia 10/01/2018   Hypertension associated with diabetes ([place]) 10/01/2018   Loss of appetite 10/01/2018   Mental health problem 10/01/2018   Seizures ([place]) 10/01/2018   Abdominal pain 07/02/2018   Gastroesophageal reflux disease without esophagitis 02/16/2018   Primary insomnia 02/16/2018   Prediabetes 01/28/2018   Insulin dependent type 2 diabetes mellitus ([place]) 01/28/2018   Syncope 08/26/2017   Acute renal failure superimposed on stage 3 chronic kidney disease ([place]) 08/26/2017   Orthostatic syncope    Self mutilating behavior 08/08/2017   Acne 06/11/2017   Self-care deficit for bathing 06/11/2017   Urinary tract infection with hematuria    OCD (obsessive compulsive disorder) 09/29/2016   Anorexia nervosa with bulimia 09/29/2016   Deafness congenital 09/29/2016   Intractable nausea and vomiting 09/29/2016   Bloody diarrhea 09/29/2016   AKI (acute kidney injury) ([place]) 09/29/2016   Severe dehydration 09/29/2016   Severe protein-calorie malnutrition ([place]) 09/29/2016   Congenital hypothyroidism 09/29/2016   Hypertriglyceridemia 09/29/2016   Anemia 09/29/2016   Endometriosis 09/29/2016  MDD (major depressive disorder) 09/29/2016   Hyperlipidemia 09/29/2016   CKD (chronic kidney disease), stage III ([place]) 09/29/2016   Acute kidney injury ([place]) 09/29/2016   OAB (overactive bladder) 09/29/2016   Sepsis ([place]) 09/18/2016   Acute pyelonephritis 09/18/2016   Urinary incontinence 08/09/2015   MDD (major depressive disorder), recurrent, severe, with psychosis ([place]) 12/11/2014   Severe recurrent major depressive disorder with psychotic symptoms ([place]) 12/11/2014   Migraine 07/02/2013   Eating disorder, unspecified 11/23/2012    Hypothyroidism 11/23/2012   Hyperlipidemia associated with type 2 diabetes mellitus ([place]) 11/23/2012   Sinus tachycardia 11/23/2012   CKD (chronic kidney disease) stage 3, GFR 30-59 ml/min ([facility]) 11/18/2012   Protein-calorie malnutrition, severe ([place]) 11/13/2012   Abdominal pain, epigastric 11/12/2012   Dehydration 11/12/2012   Acute on chronic renal failure ([place]) 11/12/2012   Leukocytosis, unspecified 11/12/2012   Hyponatremia 11/12/2012   Borderline personality disorder ([place]) 11/12/2012   Past Medical History:  Diagnosis Date   Anemia    Anorexia    Anxiety    Arthritis    "hands" (09/18/2016)   Chronic kidney disease    Chronic lower back pain    Chronic renal insufficiency    Deaf    Deafness    Depression    Diabetes ([place])    "borderline"   Eating disorder    Endometriosis    GERD (gastroesophageal reflux disease)    History of kidney stones    Hyperlipidemia    Hypothyroidism    "born without a thyroid gland"   MDD (major depressive disorder)    Migraine    "frequency varies" (09/18/2016)   OCD (obsessive compulsive disorder)    Pneumonia 2011   PONV (postoperative nausea and vomiting)    Pulmonary embolism ([place]) 10/2001   Seizures ([place]) 09/2001 X 1   "day after my hysterectomy"   Seizures ([place])    Self mutilating behavior    Tachycardia     Family History  Problem Relation Age of Onset   Cancer Father    Hyperlipidemia Father    Osteoarthritis Mother    Hyperlipidemia Mother     Past Surgical History:  Procedure Laterality Date   ABDOMINAL HYSTERECTOMY  09/2001   'except for right ovary'   BREAST LUMPECTOMY Left 2000   benign   BREAST LUMPECTOMY Right 2002   benign   COCHLEAR IMPLANT Bilateral 2004-2010   "right-left"   COLONOSCOPY  06/2015    Dr Rolm Bookbinder at [facility] for rectal bleeding: internal hemorrhoids, small anal fissure.     ESOPHAGOGASTRODUODENOSCOPY N/A 10/07/2016   Procedure: ESOPHAGOGASTRODUODENOSCOPY (EGD);  Surgeon: Ladene Artist, MD;  Location: [facility] ENDOSCOPY;  Service: Endoscopy;  Laterality: N/A;   EYE MUSCLE SURGERY Left 1980   "lazy eye"   EYE MUSCLE SURGERY Right 1990   "lazy eye"   LAPAROSCOPIC ABDOMINAL EXPLORATION  2001 and 2002   for endometriosis   OOPHORECTOMY Right 08/2019   Social History   Occupational History   Occupation: Disabled.  Tobacco Use   Smoking status: Never   Smokeless tobacco: Never  Vaping Use   Vaping Use: Never used  Substance and Sexual Activity   Alcohol use: Never   Drug use: Never   Sexual activity: Never    Birth control/protection: Surgical

## 2021-01-22 ENCOUNTER — Other Ambulatory Visit: Payer: Self-pay | Admitting: Nephrology

## 2021-01-22 DIAGNOSIS — N184 Chronic kidney disease, stage 4 (severe): Secondary | ICD-10-CM

## 2021-02-05 ENCOUNTER — Other Ambulatory Visit: Payer: Medicare Other

## 2021-02-07 ENCOUNTER — Other Ambulatory Visit: Payer: Medicare Other

## 2021-02-12 ENCOUNTER — Other Ambulatory Visit: Payer: Medicare Other

## 2021-02-13 ENCOUNTER — Encounter: Payer: Self-pay | Admitting: Student

## 2021-02-13 ENCOUNTER — Other Ambulatory Visit: Payer: Self-pay

## 2021-02-13 ENCOUNTER — Ambulatory Visit: Payer: Medicare Other | Admitting: Student

## 2021-02-13 VITALS — BP 162/101 | HR 95 | Temp 98.4°F | Resp 17 | Ht 68.0 in | Wt 193.0 lb

## 2021-02-13 DIAGNOSIS — R0609 Other forms of dyspnea: Secondary | ICD-10-CM

## 2021-02-13 DIAGNOSIS — I10 Essential (primary) hypertension: Secondary | ICD-10-CM

## 2021-02-13 MED ORDER — CARVEDILOL 6.25 MG PO TABS: 6.2500 mg | ORAL_TABLET | Freq: Two times a day (BID) | ORAL | 3 refills | Status: DC

## 2021-02-13 NOTE — Progress Notes (Signed)
Primary Physician/Referring:  Bernerd Limbo, MD  Patient ID: Brandi Love, female    DOB: 1975-11-24, 45 y.o.   MRN: [id number]  Chief Complaint  Patient presents with   Hypertension   Hyperlipidemia    1 YEAR   HPI:    Brandi Love  is a 45 y.o. eating disorder,  depression with psychosis, borderline personality disorder, OCD, hypothyroidism, deafness with cochlear implants, and chronic kidney disease stage III, and is attributed to NSAID use and also patient once try to commit suicide by consuming excessive amounts of aspirin in the remote past, DM.  She is being followed at [facility] for familial hypertriglyceridemia.   Patient was last seen in our office 07/10/2020 with shortness of breath which time she was advised to follow-up in the emergency department given concerns for pulmonary embolism.  Pulmonary perfusion study was negative for PE at that time, however she was admitted to the hospital for AKI.  She has had multiple ED evaluations since then for various concerns including AKI and flank pain.  She has had multiple hospitalizations in the past for eating disorder and depression.  Patient now presents for previously scheduled annual follow-up.  Patient was recently diagnosed with COVID-19 earlier this month in 01/2021.  She states that over the last several weeks to months she has had worsening dyspnea on exertion.  She also notes occasional nonexertional chest discomfort which is brief.  Notably patient's dyspnea has significantly improved since last office visit.  Patient's chronic kidney disease has now progressed to stage IV, she follows with Dr. Justin Mend of [facility] for management of this.  Patient's amlodipine was discontinued during ED visit in April 2022 given soft blood pressures.  Past Medical History:  Diagnosis Date   Anemia    Anorexia    Anxiety    Arthritis    "hands" (09/18/2016)   Chronic kidney disease    Chronic lower back pain    Chronic renal  insufficiency    Deaf    Deafness    Depression    Diabetes ([place])    "borderline"   Eating disorder    Endometriosis    GERD (gastroesophageal reflux disease)    History of kidney stones    Hyperlipidemia    Hypothyroidism    "born without a thyroid gland"   MDD (major depressive disorder)    Migraine    "frequency varies" (09/18/2016)   OCD (obsessive compulsive disorder)    Pneumonia 2011   PONV (postoperative nausea and vomiting)    Pulmonary embolism ([place]) 10/2001   Seizures ([place]) 09/2001 X 1   "day after my hysterectomy"   Seizures ([place])    Self mutilating behavior    Tachycardia    Past Surgical History:  Procedure Laterality Date   ABDOMINAL HYSTERECTOMY  09/2001   'except for right ovary'   BREAST LUMPECTOMY Left 2000   benign   BREAST LUMPECTOMY Right 2002   benign   COCHLEAR IMPLANT Bilateral 2004-2010   "right-left"   COLONOSCOPY  06/2015    Dr Rolm Bookbinder at [place] for rectal bleeding: internal hemorrhoids, small anal fissure.     ESOPHAGOGASTRODUODENOSCOPY N/A 10/07/2016   Procedure: ESOPHAGOGASTRODUODENOSCOPY (EGD);  Surgeon: Ladene Artist, MD;  Location: [facility];  Service: Endoscopy;  Laterality: N/A;   EYE MUSCLE SURGERY Left 1980   "lazy eye"   EYE MUSCLE SURGERY Right 1990   "lazy eye"   LAPAROSCOPIC ABDOMINAL EXPLORATION  2001 and 2002   for  endometriosis   OOPHORECTOMY Right 08/2019   Family History  Problem Relation Age of Onset   Cancer Father    Hyperlipidemia Father    Osteoarthritis Mother    Hyperlipidemia Mother     Social History   Tobacco Use   Smoking status: Never   Smokeless tobacco: Never  Substance Use Topics   Alcohol use: Never   Marital Status: Single  ROS  Review of Systems  Constitutional: Negative for malaise/fatigue and weight gain.  Cardiovascular:  Positive for chest pain (occasional, non-exertional) and dyspnea on exertion. Negative for claudication, leg swelling, near-syncope, orthopnea,  palpitations, paroxysmal nocturnal dyspnea and syncope.  Respiratory:  Negative for shortness of breath.   Gastrointestinal:  Negative for melena.  Neurological:  Positive for disturbances in coordination, dizziness and loss of balance.  Objective  Blood pressure (!) 162/101, pulse 95, temperature 98.4 F (36.9 C), temperature source Temporal, resp. rate 17, height '5\' 8"'  (1.727 m), weight 193 lb (87.5 kg), SpO2 98 %.  Vitals with BMI 02/13/2021 02/13/2021 11/03/2020  Height - '5\' 8"'  -  Weight - 193 lbs -  BMI - 84.66 -  Systolic 599 357 017  Diastolic 793 903 009  Pulse 95 92 85  Some encounter information is confidential and restricted. Go to Review Flowsheets activity to see all data.     Physical Exam Vitals reviewed.  HENT:     Head: Normocephalic and atraumatic.  Cardiovascular:     Rate and Rhythm: Normal rate and regular rhythm.     Pulses: Normal pulses and intact distal pulses.     Heart sounds: S1 normal and S2 normal. No murmur heard.   Gallop present. S4 sounds present.     Comments: No JVD. Pulmonary:     Effort: Pulmonary effort is normal. No respiratory distress.     Breath sounds: Normal breath sounds. No wheezing, rhonchi or rales.  Musculoskeletal:     Right lower leg: No edema.     Left lower leg: No edema.  Neurological:     Mental Status: She is alert.   Laboratory examination:   Recent Labs    07/15/20 0254 07/25/20 2300 11/03/20 1637  NA 136 137 133*  K 4.8 4.0 5.4*  CL 107 105 103  CO2 17* 18* 18*  GLUCOSE 126* 140* 166*  BUN 53* 71* 58*  CREATININE 2.72* 2.91* 2.71*  CALCIUM 9.5 9.4 9.3  GFRNONAA 22* 20* 22*   CrCl cannot be calculated (Patient's most recent lab result is older than the maximum 21 days allowed.).  CMP Latest Ref Rng & Units 11/03/2020 07/25/2020 07/15/2020  Glucose 70 - 99 mg/dL 166(H) 140(H) 126(H)  BUN 6 - 20 mg/dL 58(H) 71(H) 53(H)  Creatinine 0.44 - 1.00 mg/dL 2.71(H) 2.91(H) 2.72(H)  Sodium 135 - 145 mmol/L 133(L) 137  136  Potassium 3.5 - 5.1 mmol/L 5.4(H) 4.0 4.8  Chloride 98 - 111 mmol/L 103 105 107  CO2 22 - 32 mmol/L 18(L) 18(L) 17(L)  Calcium 8.9 - 10.3 mg/dL 9.3 9.4 9.5  Total Protein 6.5 - 8.1 g/dL - - -  Total Bilirubin 0.3 - 1.2 mg/dL - - -  Alkaline Phos 38 - 126 U/L - - -  AST 15 - 41 U/L - - -  ALT 0 - 44 U/L - - -   CBC Latest Ref Rng & Units 11/03/2020 07/25/2020 07/15/2020  WBC 4.0 - 10.5 K/uL 8.9 7.1 6.2  Hemoglobin 12.0 - 15.0 g/dL 11.3(L) 10.9(L) 10.4(L)  Hematocrit 36.0 -  46.0 % 32.8(L) 29.4(L) 31.2(L)  Platelets 150 - 400 K/uL 233 259 225    Lipid Panel Recent Labs    07/13/20 0432  CHOL 193  TRIG 1,083*  LDLCALC UNABLE TO CALCULATE IF TRIGLYCERIDE OVER 400 mg/dL  VLDL UNABLE TO CALCULATE IF TRIGLYCERIDE OVER 400 mg/dL  HDL 25*  CHOLHDL 7.7  LDLDIRECT 46    HEMOGLOBIN A1C Lab Results  Component Value Date   HGBA1C 6.8 (H) 07/11/2020   MPG 148.46 07/11/2020   TSH Recent Labs    07/11/20 0404  TSH 0.526    External labs:   Chest x-ray PA and lateral view 07/04/2020: Normal chest x-ray.  D-dimer 07/04/2020: 290 (<500).  Labs 07/07/2020:  BNP normal at 22.4.  Serum glucose 191 mg, BUN 63, creatinine 2.80, EGFR 21 mL, potassium 5.5.  Hb 10.8/HCT 31.0, platelets 249.  Iron studies: TIBC 474, elevated.  Iron saturation markedly reduced at 11.  Labs 11/08/2019:  Total cholesterol 477, triglycerides 4259, HDL 31, LDL not calculated.  Labs 10/28/2019: TSH minimally elevated at 4.680.  Free T4 normal.  Allergies   Allergies  Allergen Reactions   Fish Oil Anaphylaxis    Facial edema, itching   Iodinated Diagnostic Agents Anaphylaxis    'cant breathe'   Other Itching, Swelling, Rash and Anaphylaxis    Unable to take due to renal insufficency Unable to ttake due to renal insufficency Eyes swell 'cant breathe' 'cant breathe' 'cant breathe' 'cant breathe' Other reaction(s): Other (See Comments) Patient stated she can not take because she has renal  insufficiency Unable to ttake due to renal insufficency 'cant breathe' Other reaction(s): Other (See Comments) [place] Food   Phenylephrine-Guaifenesin Shortness Of Breath and Swelling    Throat swelling   Propoxyphene Anaphylaxis   Hydromorphone Hcl Itching    Redness, can use paper tape Unable to ttake due to renal insufficency Redness, can use paper tape Unable to ttake due to renal insufficency   Abilify [Aripiprazole] Nausea And Vomiting   Bee Venom     Other reaction(s): Other (See Comments)   Dhea [Nutritional Supplements] Nausea And Vomiting and Swelling   Dihydroergotamine Nausea And Vomiting   Divalproex Sodium Nausea And Vomiting and Other (See Comments)    Weight gain   Doxycycline Nausea And Vomiting   Erythromycin Nausea And Vomiting   Indomethacin Nausea And Vomiting   Lactose Intolerance (Gi) Nausea And Vomiting   Risperidone And Related Other (See Comments)    Out of body feeling   Shellfish Allergy Itching and Swelling    Eyes swell   Statins Other (See Comments)    Cannot tolerate   Tape Other (See Comments)    Redness/ please use paper tape   Dihydroergotamine Nausea And Vomiting   Lactose Intolerance (Gi) Nausea And Vomiting   Nsaids Other (See Comments)    Unable to ttake due to renal insufficency   Sulfa Antibiotics Itching    Redness/ please use paper tape Redness, can use paper tape   Tape Other (See Comments)    Redness, can use paper tape    Medications Prior to Visit:   Outpatient Medications Prior to Visit  Medication Sig Dispense Refill   acetaminophen (TYLENOL) 500 MG tablet Take 1,000-3,000 mg by mouth See admin instructions. Take 2 tablets in the morning, and  5 tablets at bedtime     albuterol (VENTOLIN HFA) 108 (90 Base) MCG/ACT inhaler Inhale 1 puff into the lungs every 6 (six) hours as needed for wheezing or shortness of breath.  8 g 0   allopurinol (ZYLOPRIM) 300 MG tablet Take 300 mg by mouth at bedtime.     aspirin EC 81 MG  tablet Take 81 mg by mouth daily.     Cholecalciferol 1.25 MG (50000 UT) capsule Take 1 capsule by mouth once a week.     Cinnamon 500 MG TABS Take 2,000 mg by mouth in the morning and at bedtime.     cyclobenzaprine (FLEXERIL) 5 MG tablet Take 1 tablet by mouth 3 (three) times daily as needed.     D3-50 1.25 MG (50000 UT) capsule Take 50,000 Units by mouth every Monday.     dicyclomine (BENTYL) 20 MG tablet Take 1 tablet (20 mg total) by mouth 2 (two) times daily. (Patient taking differently: Take 20 mg by mouth 2 (two) times daily as needed for spasms.) 20 tablet 0   docusate sodium (COLACE) 100 MG capsule Take 200 mg by mouth 2 (two) times daily.     estradiol (CLIMARA - DOSED IN MG/24 HR) 0.05 mg/24hr patch Place 0.05 mg onto the skin once a week.     fluticasone (FLONASE) 50 MCG/ACT nasal spray Place 1 spray into both nostrils daily as needed for allergies or rhinitis.     furosemide (LASIX) 40 MG tablet Take 1 tablet (40 mg total) by mouth daily. (Patient taking differently: Take 40 mg by mouth as needed.) 15 tablet 0   gemfibrozil (LOPID) 600 MG tablet Take 300-600 mg by mouth See admin instructions. 600 mg in the morning and 300 mg in the evening     glimepiride (AMARYL) 4 MG tablet Take 4 mg by mouth daily.     HYDROcodone-acetaminophen (NORCO/VICODIN) 5-325 MG tablet Take 1 tablet by mouth every 6 (six) hours as needed for moderate pain or severe pain. 20 tablet 0   Insulin Aspart Prot & Aspart (NOVOLOG MIX 70/30 FLEXPEN [place]) Inject 45 Units into the skin daily with supper.     INVOKANA 300 MG TABS tablet Take 300 mg by mouth every morning.     levothyroxine (SYNTHROID) 150 MCG tablet Take 150 mcg by mouth daily.      lurasidone (LATUDA) 20 MG TABS tablet Take 20 mg by mouth at bedtime.     magnesium oxide (MAG-OX) 400 (240 Mg) MG tablet Take 1 tablet by mouth 2 (two) times daily.     meclizine (ANTIVERT) 25 MG tablet Take 25 mg by mouth 3 (three) times daily as needed for dizziness.      Melatonin 5 MG TABS Take 10 mg by mouth at bedtime. One 5 mg pill and one 5 mg tab     metoCLOPramide (REGLAN) 10 MG tablet Take 1 tablet (10 mg total) by mouth every 6 (six) hours as needed for nausea or vomiting. 12 tablet 0   Multiple Vitamin (MULTIVITAMIN WITH MINERALS) TABS Take 1 tablet by mouth daily.     NOVOLOG FLEXPEN 100 UNIT/ML FlexPen      pantoprazole (PROTONIX) 40 MG tablet Take 40 mg by mouth daily.     polyethylene glycol (MIRALAX / GLYCOLAX) 17 g packet Take 17 g by mouth daily. 14 each 0   pregabalin (LYRICA) 150 MG capsule Take 1 capsule (150 mg total) by mouth daily.     promethazine (PHENERGAN) 25 MG tablet Take 25 mg by mouth every 8 (eight) hours as needed for nausea or vomiting.     sertraline (ZOLOFT) 50 MG tablet Take 50 mg by mouth daily.     sodium bicarbonate  650 MG tablet Take 1 tablet (650 mg total) by mouth 3 (three) times daily. 30 tablet 0   vitamin C (ASCORBIC ACID) 500 MG tablet Take 500 mg by mouth daily.     carvedilol (COREG) 6.25 MG tablet Take 1 tablet (6.25 mg total) by mouth 2 (two) times daily with a meal. 60 tablet 0   allopurinol (ZYLOPRIM) 300 MG tablet Take by mouth.     cephALEXin (KEFLEX) 500 MG capsule Take 1 capsule (500 mg total) by mouth 4 (four) times daily. 28 capsule 0   levothyroxine (SYNTHROID) 150 MCG tablet Take 1 tablet by mouth daily.     magnesium oxide (MAG-OX) 400 (241.3 Mg) MG tablet Take 400 mg by mouth 2 (two) times a day.     phenazopyridine (PYRIDIUM) 200 MG tablet Take 200 mg by mouth 3 (three) times daily as needed.     No facility-administered medications prior to visit.   Final Medications at End of Visit    Current Meds  Medication Sig   acetaminophen (TYLENOL) 500 MG tablet Take 1,000-3,000 mg by mouth See admin instructions. Take 2 tablets in the morning, and  5 tablets at bedtime   albuterol (VENTOLIN HFA) 108 (90 Base) MCG/ACT inhaler Inhale 1 puff into the lungs every 6 (six) hours as needed for wheezing or  shortness of breath.   allopurinol (ZYLOPRIM) 300 MG tablet Take 300 mg by mouth at bedtime.   aspirin EC 81 MG tablet Take 81 mg by mouth daily.   Cholecalciferol 1.25 MG (50000 UT) capsule Take 1 capsule by mouth once a week.   Cinnamon 500 MG TABS Take 2,000 mg by mouth in the morning and at bedtime.   cyclobenzaprine (FLEXERIL) 5 MG tablet Take 1 tablet by mouth 3 (three) times daily as needed.   D3-50 1.25 MG (50000 UT) capsule Take 50,000 Units by mouth every Monday.   dicyclomine (BENTYL) 20 MG tablet Take 1 tablet (20 mg total) by mouth 2 (two) times daily. (Patient taking differently: Take 20 mg by mouth 2 (two) times daily as needed for spasms.)   docusate sodium (COLACE) 100 MG capsule Take 200 mg by mouth 2 (two) times daily.   estradiol (CLIMARA - DOSED IN MG/24 HR) 0.05 mg/24hr patch Place 0.05 mg onto the skin once a week.   fluticasone (FLONASE) 50 MCG/ACT nasal spray Place 1 spray into both nostrils daily as needed for allergies or rhinitis.   furosemide (LASIX) 40 MG tablet Take 1 tablet (40 mg total) by mouth daily. (Patient taking differently: Take 40 mg by mouth as needed.)   gemfibrozil (LOPID) 600 MG tablet Take 300-600 mg by mouth See admin instructions. 600 mg in the morning and 300 mg in the evening   glimepiride (AMARYL) 4 MG tablet Take 4 mg by mouth daily.   HYDROcodone-acetaminophen (NORCO/VICODIN) 5-325 MG tablet Take 1 tablet by mouth every 6 (six) hours as needed for moderate pain or severe pain.   Insulin Aspart Prot & Aspart (NOVOLOG MIX 70/30 FLEXPEN [place]) Inject 45 Units into the skin daily with supper.   INVOKANA 300 MG TABS tablet Take 300 mg by mouth every morning.   levothyroxine (SYNTHROID) 150 MCG tablet Take 150 mcg by mouth daily.    lurasidone (LATUDA) 20 MG TABS tablet Take 20 mg by mouth at bedtime.   magnesium oxide (MAG-OX) 400 (240 Mg) MG tablet Take 1 tablet by mouth 2 (two) times daily.   meclizine (ANTIVERT) 25 MG tablet Take 25 mg  by mouth 3  (three) times daily as needed for dizziness.   Melatonin 5 MG TABS Take 10 mg by mouth at bedtime. One 5 mg pill and one 5 mg tab   metoCLOPramide (REGLAN) 10 MG tablet Take 1 tablet (10 mg total) by mouth every 6 (six) hours as needed for nausea or vomiting.   Multiple Vitamin (MULTIVITAMIN WITH MINERALS) TABS Take 1 tablet by mouth daily.   NOVOLOG FLEXPEN 100 UNIT/ML FlexPen    pantoprazole (PROTONIX) 40 MG tablet Take 40 mg by mouth daily.   polyethylene glycol (MIRALAX / GLYCOLAX) 17 g packet Take 17 g by mouth daily.   pregabalin (LYRICA) 150 MG capsule Take 1 capsule (150 mg total) by mouth daily.   promethazine (PHENERGAN) 25 MG tablet Take 25 mg by mouth every 8 (eight) hours as needed for nausea or vomiting.   sertraline (ZOLOFT) 50 MG tablet Take 50 mg by mouth daily.   sodium bicarbonate 650 MG tablet Take 1 tablet (650 mg total) by mouth 3 (three) times daily.   vitamin C (ASCORBIC ACID) 500 MG tablet Take 500 mg by mouth daily.   [DISCONTINUED] carvedilol (COREG) 6.25 MG tablet Take 1 tablet (6.25 mg total) by mouth 2 (two) times daily with a meal.   Radiology:   No results found.  Cardiac Studies:   Echocardiogram 09/30/2016: Normal LV size, normal LV systolic function, EF 67-61% without regional wall motion abnormality.  Stress test- 10/13/12 Negative for ischemia, there was no significant ST.   depression c.f. resting EKG. Walked for 7 minutes.   Peak HR- 174/min ( 93% MPHR), achieved 8.2 METS.  EKG  02/13/2021: Sinus rhythm at a rate of 92 bpm.  Left atrial enlargement.  Normal axis.  Poor R wave progression, cannot exclude anteroseptal infarct old.  No evidence of ischemia or underlying injury pattern.  07/10/2020: Sinus tachycardia at rate of 101 bpm, left atrial abnormality, normal axis, incomplete right bundle branch block, poor R wave progression, probably normal variant.  No evidence of ischemia, normal QT interval. No significant change from prior EKG.  09/17/2019:  Sinus tachycardia at the rate of 105 bpm, normal axis, poor R wave progression, probably normal variant.  No evidence of ischemia.  No significant change from prior EKG.  Assessment     ICD-10-CM   1. Primary hypertension  I10 EKG 12-Lead    2. DOE (dyspnea on exertion)  R06.09 Brain natriuretic peptide    PCV ECHOCARDIOGRAM COMPLETE       Medications Discontinued During This Encounter  Medication Reason   allopurinol (ZYLOPRIM) 300 MG tablet Error   cephALEXin (KEFLEX) 500 MG capsule Error   levothyroxine (SYNTHROID) 150 MCG tablet Error   magnesium oxide (MAG-OX) 400 (241.3 Mg) MG tablet Error   phenazopyridine (PYRIDIUM) 200 MG tablet Error   carvedilol (COREG) 6.25 MG tablet Reorder    Meds ordered this encounter  Medications   carvedilol (COREG) 6.25 MG tablet    Sig: Take 1 tablet (6.25 mg total) by mouth 2 (two) times daily with a meal.    Dispense:  180 tablet    Refill:  3   Orders Placed This Encounter  Procedures   Brain natriuretic peptide   EKG 12-Lead   PCV ECHOCARDIOGRAM COMPLETE    Standing Status:   Future    Standing Expiration Date:   02/13/2022    Recommendations:   Brandi Love is a 45 y.o. eating disorder,  depression with psychosis, borderline personality disorder, OCD, hypothyroidism,  deafness with cochlear implants, and chronic kidney disease stage III, and is attributed to NSAID use and also patient once try to commit suicide by consuming excessive amounts of aspirin in the remote past, DM.  She is being followed at [facility] for familial hypertriglyceridemia.   Patient was last seen in our office 07/10/2020 with shortness of breath which time she was advised to follow-up in the emergency department given concerns for pulmonary embolism.  Pulmonary perfusion study was negative for PE at that time, however she was admitted to the hospital for AKI.  She has had multiple ED evaluations since then for various concerns including AKI and flank pain.   She has had multiple hospitalizations in the past for eating disorder and depression.   Patient presents for previously scheduled annual visit.  Her primary concern today is worsening dyspnea on exertion.  Suspect this is multifactorial including obesity and deconditioning as well as recent COVID-19 infection.  However given that this has been ongoing for several weeks to months shared decision was to proceed with Repeat echocardiogram as well as obtain BNP.  There is no clinical evidence of heart failure at today's visit. Suspect patient's chest pain symptoms may be related to uncontrolled hypertension, shared decision was to hold off on further ischemic evaluation at this time as EKG is without ischemic changes today and her symptoms are quite atypical.  However counseled patient regarding signs and symptoms that would warrant urgent or emergent evaluation, she verbalized understanding agreement.  Patient's sinus tachycardia is well controlled with carvedilol, will continue this.  In regard to hypertension, it is presently uncontrolled.  Patient's blood pressure was previously well controlled with carvedilol and amlodipine.  Would recommend reinitiation of amlodipine 10 mg p.o. daily.  However patient expresses concern as she was previously advised by nephrology to hold amlodipine in the setting of hypotension.  We will verify with nephrology that they are comfortable resuming amlodipine prior to restarting this medication.  Follow-up in 8 weeks, sooner if needed, for results of cardiac testing and hypertension.   Alethia Berthold, PA-C 02/13/2021, 3:15 PM Office: [phone]

## 2021-02-14 ENCOUNTER — Emergency Department (HOSPITAL_COMMUNITY): Payer: Medicare Other

## 2021-02-14 ENCOUNTER — Emergency Department (HOSPITAL_COMMUNITY)
Admission: EM | Admit: 2021-02-14 | Discharge: 2021-02-15 | Disposition: A | Discharge status: Home / Self Care | Payer: Medicare Other | Attending: Emergency Medicine | Admitting: Emergency Medicine

## 2021-02-14 ENCOUNTER — Other Ambulatory Visit: Payer: Medicare Other

## 2021-02-14 ENCOUNTER — Encounter (HOSPITAL_COMMUNITY): Payer: Self-pay

## 2021-02-14 DIAGNOSIS — E039 Hypothyroidism, unspecified: Secondary | ICD-10-CM | POA: Diagnosis not present

## 2021-02-14 DIAGNOSIS — Z794 Long term (current) use of insulin: Secondary | ICD-10-CM | POA: Diagnosis not present

## 2021-02-14 DIAGNOSIS — M79662 Pain in left lower leg: Secondary | ICD-10-CM | POA: Insufficient documentation

## 2021-02-14 DIAGNOSIS — Z79899 Other long term (current) drug therapy: Secondary | ICD-10-CM | POA: Insufficient documentation

## 2021-02-14 DIAGNOSIS — E1122 Type 2 diabetes mellitus with diabetic chronic kidney disease: Secondary | ICD-10-CM | POA: Diagnosis not present

## 2021-02-14 DIAGNOSIS — I129 Hypertensive chronic kidney disease with stage 1 through stage 4 chronic kidney disease, or unspecified chronic kidney disease: Secondary | ICD-10-CM | POA: Diagnosis not present

## 2021-02-14 DIAGNOSIS — Z7984 Long term (current) use of oral hypoglycemic drugs: Secondary | ICD-10-CM | POA: Insufficient documentation

## 2021-02-14 DIAGNOSIS — M79605 Pain in left leg: Secondary | ICD-10-CM

## 2021-02-14 DIAGNOSIS — M79604 Pain in right leg: Secondary | ICD-10-CM | POA: Diagnosis present

## 2021-02-14 DIAGNOSIS — M79661 Pain in right lower leg: Secondary | ICD-10-CM | POA: Diagnosis not present

## 2021-02-14 DIAGNOSIS — R0602 Shortness of breath: Secondary | ICD-10-CM | POA: Diagnosis not present

## 2021-02-14 DIAGNOSIS — I10 Essential (primary) hypertension: Secondary | ICD-10-CM

## 2021-02-14 DIAGNOSIS — N189 Chronic kidney disease, unspecified: Secondary | ICD-10-CM

## 2021-02-14 DIAGNOSIS — Z8616 Personal history of COVID-19: Secondary | ICD-10-CM | POA: Insufficient documentation

## 2021-02-14 DIAGNOSIS — R000 Tachycardia, unspecified: Secondary | ICD-10-CM | POA: Insufficient documentation

## 2021-02-14 DIAGNOSIS — Z7982 Long term (current) use of aspirin: Secondary | ICD-10-CM | POA: Insufficient documentation

## 2021-02-14 DIAGNOSIS — N183 Chronic kidney disease, stage 3 unspecified: Secondary | ICD-10-CM | POA: Insufficient documentation

## 2021-02-14 DIAGNOSIS — R252 Cramp and spasm: Secondary | ICD-10-CM | POA: Insufficient documentation

## 2021-02-14 LAB — CBC
HCT: 33.2 % — ABNORMAL LOW (ref 36.0–46.0)
Hemoglobin: 10.3 g/dL — ABNORMAL LOW (ref 12.0–15.0)
MCH: 26.4 pg (ref 26.0–34.0)
MCHC: 31 g/dL (ref 30.0–36.0)
MCV: 85.1 fL (ref 80.0–100.0)
Platelets: 356 10*3/uL (ref 150–400)
RBC: 3.9 MIL/uL (ref 3.87–5.11)
RDW: 13.8 % (ref 11.5–15.5)
WBC: 7.5 10*3/uL (ref 4.0–10.5)
nRBC: 0 % (ref 0.0–0.2)

## 2021-02-14 LAB — BASIC METABOLIC PANEL
Anion gap: 13 (ref 5–15)
BUN: 60 mg/dL — ABNORMAL HIGH (ref 6–20)
CO2: 20 mmol/L — ABNORMAL LOW (ref 22–32)
Calcium: 9.4 mg/dL (ref 8.9–10.3)
Chloride: 103 mmol/L (ref 98–111)
Creatinine, Ser: 3.29 mg/dL — ABNORMAL HIGH (ref 0.44–1.00)
GFR, Estimated: 17 mL/min — ABNORMAL LOW (ref >60)
Glucose, Bld: 323 mg/dL — ABNORMAL HIGH (ref 70–99)
Potassium: 5 mmol/L (ref 3.5–5.1)
Sodium: 136 mmol/L (ref 135–145)

## 2021-02-14 LAB — D-DIMER, QUANTITATIVE: D-Dimer, Quant: 0.35 ug/mL-FEU (ref 0.00–0.50)

## 2021-02-14 LAB — BRAIN NATRIURETIC PEPTIDE: B Natriuretic Peptide: 130 pg/mL — ABNORMAL HIGH (ref 0.0–100.0)

## 2021-02-14 LAB — TROPONIN I (HIGH SENSITIVITY)
Troponin I (High Sensitivity): 4 ng/L (ref <18)
Troponin I (High Sensitivity): 4 ng/L (ref <18)

## 2021-02-14 LAB — HCG, QUANTITATIVE, PREGNANCY: hCG, Beta Chain, Quant, S: 1 m[IU]/mL (ref <5)

## 2021-02-14 NOTE — ED Provider Notes (Signed)
Emergency Medicine Provider Triage Evaluation Note  Brandi Love , a 45 y.o. female  was evaluated in triage.  Pt complains of right leg pain since yesterday with chest pain and shortness of breath starting today.  Patient saw her primary care yesterday, and there is concern for blood clot in the right lower extremity due to pain and swelling.  States that she had COVID 3 weeks ago and was taking Paxlovid, and symptoms had been improving.  Patient is deaf, but is able to read lips well.  Not require interpreter on my exam.  Review of Systems  Positive: Right lower leg pain and swelling, chest pain, shortness of breath Negative: Fevers, chills, cough, congestion  Physical Exam  BP (!) 170/101 (BP Location: Right Arm)   Pulse (!) 103   Temp 98 F (36.7 C) (Oral)   Resp 18   LMP  (LMP Unknown)   SpO2 98%  Gen:   Awake, no distress   Resp:  Normal effort  MSK:   Moves extremities without difficulty  Other:  Lungs clear to auscultation  Medical Decision Making  Medically screening exam initiated at 8:38 PM.  Appropriate orders placed.  Jocelyn Nold Kamath was informed that the remainder of the evaluation will be completed by another provider, this initial triage assessment does not replace that evaluation, and the importance of remaining in the ED until their evaluation is complete.  Unable to get ultrasound due to staffing, patient has contrast allergy negating CTA at this time  Plan to get D-dimer, chest x-ray, and VQ scan   Kateri Plummer, PA-C 02/14/21 2043    Drenda Freeze, MD 02/14/21 [phone]

## 2021-02-14 NOTE — ED Triage Notes (Signed)
Pt states that she has been having R leg pain and CP that started today

## 2021-02-14 NOTE — ED Triage Notes (Signed)
Pt seen PCP yesterday for concerns of blood clots d/t right leg swelling and redness. Pt heard of hearing.

## 2021-02-15 ENCOUNTER — Emergency Department (HOSPITAL_COMMUNITY): Payer: Medicare Other

## 2021-02-15 DIAGNOSIS — M79661 Pain in right lower leg: Secondary | ICD-10-CM | POA: Diagnosis not present

## 2021-02-15 LAB — I-STAT VENOUS BLOOD GAS, ED
Acid-base deficit: 3 mmol/L — ABNORMAL HIGH (ref 0.0–2.0)
Bicarbonate: 21.7 mmol/L (ref 20.0–28.0)
Calcium, Ion: 1.16 mmol/L (ref 1.15–1.40)
HCT: 32 % — ABNORMAL LOW (ref 36.0–46.0)
Hemoglobin: 10.9 g/dL — ABNORMAL LOW (ref 12.0–15.0)
O2 Saturation: 66 %
Patient temperature: 37
Potassium: 5.3 mmol/L — ABNORMAL HIGH (ref 3.5–5.1)
Sodium: 134 mmol/L — ABNORMAL LOW (ref 135–145)
TCO2: 23 mmol/L (ref 22–32)
pCO2, Ven: 37.2 mmHg — ABNORMAL LOW (ref 44.0–60.0)
pH, Ven: 7.374 (ref 7.250–7.430)
pO2, Ven: 35 mmHg (ref 32.0–45.0)

## 2021-02-15 LAB — BETA-HYDROXYBUTYRIC ACID: Beta-Hydroxybutyric Acid: 0.24 mmol/L (ref 0.05–0.27)

## 2021-02-15 LAB — CK: Total CK: 66 U/L (ref 38–234)

## 2021-02-15 LAB — CBG MONITORING, ED: Glucose-Capillary: 267 mg/dL — ABNORMAL HIGH (ref 70–99)

## 2021-02-15 MED ORDER — PREGABALIN 25 MG PO CAPS
150.0000 mg | ORAL_CAPSULE | Freq: Once | ORAL | Status: AC
  Administered 2021-02-15: 150 mg via ORAL
  Filled 2021-02-15: qty 2

## 2021-02-15 MED ORDER — HYDROCODONE-ACETAMINOPHEN 5-325 MG PO TABS
1.0000 | ORAL_TABLET | Freq: Once | ORAL | Status: AC
Start: 2021-02-15 — End: 2021-02-15
  Administered 2021-02-15: 1 via ORAL
  Filled 2021-02-15: qty 1

## 2021-02-15 MED ORDER — CYCLOBENZAPRINE HCL 10 MG PO TABS
5.0000 mg | ORAL_TABLET | Freq: Once | ORAL | Status: AC
  Administered 2021-02-15: 5 mg via ORAL
  Filled 2021-02-15: qty 1

## 2021-02-15 MED ORDER — TECHNETIUM TO 99M ALBUMIN AGGREGATED
4.4000 | Freq: Once | INTRAVENOUS | Status: AC | PRN
  Administered 2021-02-15: 4.4 via INTRAVENOUS

## 2021-02-15 MED ORDER — INSULIN ASPART PROT & ASPART (70-30 MIX) 100 UNIT/ML ~~LOC~~ SUSP
25.0000 [IU] | Freq: Once | SUBCUTANEOUS | Status: AC
  Administered 2021-02-15: 25 [IU] via SUBCUTANEOUS
  Filled 2021-02-15: qty 10

## 2021-02-15 NOTE — ED Provider Notes (Signed)
[facility] EMERGENCY DEPARTMENT Provider Note   CSN: [id number] Arrival date & time: 02/14/21  1952     History Chief Complaint  Patient presents with   Leg Pain   Chest Pain    Brandi Love is a 45 y.o. female.  Brandi Love 45 yo female who presents to the ED with R leg pain, bilateral leg cramping and SOB x 1 day. Pt states she started having R leg pain and swelling yesterday. She was concerned as she had concurrent SOB and contacted her PCP who wanted her to be seen in the ED for eval. She states she normally has some level of leg swelling and fluid overload, but it is usually bilateral. Of note, she has noticed fluid overload x 1 week and a 20lb weight gain in the past 2-3 weeks. She has tried lasix with no relief. She also has had episodes of severe bilateral leg cramping that she rates as 10/10 pain. She states that they come and go randomly and last for about 1-2 mins each. She was unable to specify number of cramping episodes in the past week. She recently had COVID 3 weeks ago and was given Paxlovid with relief. Pt states no lingering sxs from her recent infection. She states she was supposed to have imaging studies done this week (TTEcho and Abd U/S) to address this concerns but was unable to due to scheduling issues at the imaging center. She denies N/V, CP, significant abd pain or distention, bowel changes, fever, or cough.       Past Medical History:  Diagnosis Date   Anemia    Anorexia    Anxiety    Arthritis    "hands" (09/18/2016)   Chronic kidney disease    Chronic lower back pain    Chronic renal insufficiency    Deaf    Deafness    Depression    Diabetes ([place])    "borderline"   Eating disorder    Endometriosis    GERD (gastroesophageal reflux disease)    History of kidney stones    Hyperlipidemia    Hypothyroidism    "born without a thyroid gland"   MDD (major depressive disorder)    Migraine    "frequency varies" (09/18/2016)   OCD  (obsessive compulsive disorder)    Pneumonia 2011   PONV (postoperative nausea and vomiting)    Pulmonary embolism ([place]) 10/2001   Seizures ([place]) 09/2001 X 1   "day after my hysterectomy"   Seizures ([place])    Self mutilating behavior    Tachycardia     Patient Active Problem List   Diagnosis Date Noted   Acute kidney injury superimposed on chronic kidney disease ([place]) 07/10/2020   Hyperkalemia 07/10/2020   Deaf 10/01/2018   Hematochezia 10/01/2018   Hypertension associated with diabetes ([place]) 10/01/2018   Loss of appetite 10/01/2018   Mental health problem 10/01/2018   Seizures ([place]) 10/01/2018   Abdominal pain 07/02/2018   Gastroesophageal reflux disease without esophagitis 02/16/2018   Primary insomnia 02/16/2018   Prediabetes 01/28/2018   Insulin dependent type 2 diabetes mellitus ([place]) 01/28/2018   Syncope 08/26/2017   Acute renal failure superimposed on stage 3 chronic kidney disease ([place]) 08/26/2017   Orthostatic syncope    Self mutilating behavior 08/08/2017   Acne 06/11/2017   Self-care deficit for bathing 06/11/2017   Urinary tract infection with hematuria    OCD (obsessive compulsive disorder) 09/29/2016   Anorexia nervosa with bulimia 09/29/2016   Deafness  congenital 09/29/2016   Intractable nausea and vomiting 09/29/2016   Bloody diarrhea 09/29/2016   AKI (acute kidney injury) ([place]) 09/29/2016   Severe dehydration 09/29/2016   Severe protein-calorie malnutrition ([place]) 09/29/2016   Congenital hypothyroidism 09/29/2016   Hypertriglyceridemia 09/29/2016   Anemia 09/29/2016   Endometriosis 09/29/2016   MDD (major depressive disorder) 09/29/2016   Hyperlipidemia 09/29/2016   CKD (chronic kidney disease), stage III ([place]) 09/29/2016   Acute kidney injury ([place]) 09/29/2016   OAB (overactive bladder) 09/29/2016   Sepsis ([place]) 09/18/2016   Acute pyelonephritis 09/18/2016   Urinary incontinence 08/09/2015   MDD (major depressive disorder), recurrent, severe, with  psychosis ([place]) 12/11/2014   Severe recurrent major depressive disorder with psychotic symptoms ([place]) 12/11/2014   Migraine 07/02/2013   Eating disorder, unspecified 11/23/2012   Hypothyroidism 11/23/2012   Hyperlipidemia associated with type 2 diabetes mellitus ([place]) 11/23/2012   Sinus tachycardia 11/23/2012   CKD (chronic kidney disease) stage 3, GFR 30-59 ml/min (H[facility]) 11/18/2012   Protein-calorie malnutrition, severe ([place]) 11/13/2012   Abdominal pain, epigastric 11/12/2012   Dehydration 11/12/2012   Acute on chronic renal failure ([place]) 11/12/2012   Leukocytosis, unspecified 11/12/2012   Hyponatremia 11/12/2012   Borderline personality disorder ([place]) 11/12/2012    Past Surgical History:  Procedure Laterality Date   ABDOMINAL HYSTERECTOMY  09/2001   'except for right ovary'   BREAST LUMPECTOMY Left 2000   benign   BREAST LUMPECTOMY Right 2002   benign   COCHLEAR IMPLANT Bilateral 2004-2010   "right-left"   COLONOSCOPY  06/2015    Dr Rolm Bookbinder at [facility] for rectal bleeding: internal hemorrhoids, small anal fissure.     ESOPHAGOGASTRODUODENOSCOPY N/A 10/07/2016   Procedure: ESOPHAGOGASTRODUODENOSCOPY (EGD);  Surgeon: Ladene Artist, MD;  Location: [facility] ENDOSCOPY;  Service: Endoscopy;  Laterality: N/A;   EYE MUSCLE SURGERY Left 1980   "lazy eye"   EYE MUSCLE SURGERY Right 1990   "lazy eye"   LAPAROSCOPIC ABDOMINAL EXPLORATION  2001 and 2002   for endometriosis   OOPHORECTOMY Right 08/2019     OB History   No obstetric history on file.     Family History  Problem Relation Age of Onset   Cancer Father    Hyperlipidemia Father    Osteoarthritis Mother    Hyperlipidemia Mother     Social History   Tobacco Use   Smoking status: Never   Smokeless tobacco: Never  Vaping Use   Vaping Use: Never used  Substance Use Topics   Alcohol use: Never   Drug use: Never    Home Medications Prior to Admission medications   Medication Sig Start Date End Date Taking?  Authorizing Provider  acetaminophen (TYLENOL) 500 MG tablet Take 1,000-3,000 mg by mouth See admin instructions. Take 2 tablets in the morning, and  5 tablets at bedtime    [provider]  albuterol (VENTOLIN HFA) 108 (90 Base) MCG/ACT inhaler Inhale 1 puff into the lungs every 6 (six) hours as needed for wheezing or shortness of breath. 07/15/20   Lavina Hamman, MD  allopurinol (ZYLOPRIM) 300 MG tablet Take 300 mg by mouth at bedtime. 08/12/19   [provider]  aspirin EC 81 MG tablet Take 81 mg by mouth daily.    [provider]  carvedilol (COREG) 6.25 MG tablet Take 1 tablet (6.25 mg total) by mouth 2 (two) times daily with a meal. 02/13/21   Cantwell, Celeste C, PA-C  Cholecalciferol 1.25 MG (50000 UT) capsule Take 1 capsule by mouth  once a week. 09/08/20   [provider]  Cinnamon 500 MG TABS Take 2,000 mg by mouth in the morning and at bedtime.    [provider]  cyclobenzaprine (FLEXERIL) 5 MG tablet Take 1 tablet by mouth 3 (three) times daily as needed. 11/10/20   [provider]  D3-50 1.25 MG (50000 UT) capsule Take 50,000 Units by mouth every Monday. 08/08/19   [provider]  dicyclomine (BENTYL) 20 MG tablet Take 1 tablet (20 mg total) by mouth 2 (two) times daily. Patient taking differently: Take 20 mg by mouth 2 (two) times daily as needed for spasms. 11/16/19   Couture, Cortni S, PA-C  docusate sodium (COLACE) 100 MG capsule Take 200 mg by mouth 2 (two) times daily. 07/14/18   [provider]  estradiol (CLIMARA - DOSED IN MG/24 HR) 0.05 mg/24hr patch Place 0.05 mg onto the skin once a week. 03/24/20   [provider]  fluticasone (FLONASE) 50 MCG/ACT nasal spray Place 1 spray into both nostrils daily as needed for allergies or rhinitis.    [provider]  furosemide (LASIX) 40 MG tablet Take 1 tablet (40 mg total) by mouth daily. Patient taking differently: Take 40 mg by mouth as needed.  07/26/20   Lajean Saver, MD  gemfibrozil (LOPID) 600 MG tablet Take 300-600 mg by mouth See admin instructions. 600 mg in the morning and 300 mg in the evening 09/09/17   [provider]  glimepiride (AMARYL) 4 MG tablet Take 4 mg by mouth daily. 06/15/19   [provider]  HYDROcodone-acetaminophen (NORCO/VICODIN) 5-325 MG tablet Take 1 tablet by mouth every 6 (six) hours as needed for moderate pain or severe pain. 07/15/20   Lavina Hamman, MD  Insulin Aspart Prot & Aspart (NOVOLOG MIX 70/30 FLEXPEN [place]) Inject 45 Units into the skin daily with supper.    [provider]  INVOKANA 300 MG TABS tablet Take 300 mg by mouth every morning. 06/23/20   [provider]  levothyroxine (SYNTHROID) 150 MCG tablet Take 150 mcg by mouth daily.  07/17/17   [provider]  lurasidone (LATUDA) 20 MG TABS tablet Take 20 mg by mouth at bedtime.    [provider]  magnesium oxide (MAG-OX) 400 (240 Mg) MG tablet Take 1 tablet by mouth 2 (two) times daily. 10/01/20   [provider]  meclizine (ANTIVERT) 25 MG tablet Take 25 mg by mouth 3 (three) times daily as needed for dizziness. 06/03/19   [provider]  Melatonin 5 MG TABS Take 10 mg by mouth at bedtime. One 5 mg pill and one 5 mg tab    [provider]  metoCLOPramide (REGLAN) 10 MG tablet Take 1 tablet (10 mg total) by mouth every 6 (six) hours as needed for nausea or vomiting. 11/07/19   Molpus, John, MD  Multiple Vitamin (MULTIVITAMIN WITH MINERALS) TABS Take 1 tablet by mouth daily.    [provider]  NOVOLOG FLEXPEN 100 UNIT/ML FlexPen  09/27/20   [provider]  pantoprazole (PROTONIX) 40 MG tablet Take 40 mg by mouth daily.    [provider]  polyethylene glycol (MIRALAX / GLYCOLAX) 17 g packet Take 17 g by mouth daily. 07/16/20   Lavina Hamman, MD  pregabalin (LYRICA) 150 MG capsule Take 1 capsule (150 mg total) by mouth daily. 07/15/20   Lavina Hamman, MD  promethazine (PHENERGAN) 25 MG tablet Take 25 mg by mouth every 8 (eight) hours  as needed for nausea or vomiting. 06/02/19   [provider]  sertraline (ZOLOFT) 50 MG tablet Take 50 mg by mouth daily. 07/09/20   [provider]  sodium bicarbonate 650 MG tablet Take 1 tablet (650 mg total) by mouth 3 (three) times daily. 07/15/20   Lavina Hamman, MD  vitamin C (ASCORBIC ACID) 500 MG tablet Take 500 mg by mouth daily.    [provider]  amLODipine (NORVASC) 5 MG tablet Take 1 tablet (5 mg total) by mouth daily. 07/16/20 07/26/20  Lavina Hamman, MD    Allergies    Fish oil, Iodinated diagnostic agents, Other, Phenylephrine-guaifenesin, Propoxyphene, Hydromorphone hcl, Abilify [aripiprazole], Bee venom, Dhea [nutritional supplements], Dihydroergotamine, Divalproex sodium, Doxycycline, Erythromycin, Indomethacin, Lactose intolerance (gi), Risperidone and related, Shellfish allergy, Statins, Tape, Dihydroergotamine, Lactose intolerance (gi), Nsaids, Sulfa antibiotics, and Tape  Review of Systems   Review of Systems  Constitutional:  Positive for unexpected weight change. Negative for fever.  Respiratory:  Positive for shortness of breath.   Cardiovascular:  Positive for leg swelling. Negative for chest pain.  Gastrointestinal:  Negative for abdominal pain, constipation, diarrhea, nausea and vomiting.  Genitourinary:  Negative for dysuria and frequency.  Musculoskeletal:  Negative for arthralgias and myalgias.  Skin:  Negative for rash and wound.  Allergic/Immunologic: Positive for immunocompromised state.  Neurological:  Negative for weakness.  Hematological:  Negative for adenopathy.  Psychiatric/Behavioral:  Negative for confusion.   All other systems reviewed and are negative.  Physical Exam Updated Vital Signs BP (!) 149/99   Pulse (!) 102   Temp 98.7 F (37.1 C) (Oral)   Resp 14   LMP  (LMP Unknown)   SpO2 95%   Physical Exam Vitals and nursing  note reviewed.  Constitutional:      General: She is not in acute distress.    Appearance: She is well-developed. She is not diaphoretic.  HENT:     Head: Normocephalic and atraumatic.  Cardiovascular:     Rate and Rhythm: Regular rhythm. Tachycardia present.     Heart sounds: Normal heart sounds. No murmur heard. Pulmonary:     Effort: Pulmonary effort is normal.     Breath sounds: Normal breath sounds.  Chest:     Chest wall: No tenderness.  Abdominal:     Palpations: Abdomen is soft.     Tenderness: There is no abdominal tenderness.  Musculoskeletal:     Cervical back: Neck supple.     Right lower leg: Tenderness present. No edema.     Left lower leg: Tenderness present. No edema.  Skin:    General: Skin is warm and dry.     Findings: No erythema.  Neurological:     Mental Status: She is alert and oriented to person, place, and time.  Psychiatric:        Behavior: Behavior normal.    ED Results / Procedures / Treatments   Labs (all labs ordered are listed, but only abnormal results are displayed) Labs Reviewed  BASIC METABOLIC PANEL - Abnormal; Notable for the following components:      Result Value   CO2 20 (*)    Glucose, Bld 323 (*)    BUN 60 (*)    Creatinine, Ser 3.29 (*)    GFR, Estimated 17 (*)    All other components within normal limits  CBC - Abnormal; Notable for the following components:   Hemoglobin 10.3 (*)    HCT 33.2 (*)    All other components  within normal limits  BRAIN NATRIURETIC PEPTIDE - Abnormal; Notable for the following components:   B Natriuretic Peptide 130.0 (*)    All other components within normal limits  I-STAT VENOUS BLOOD GAS, ED - Abnormal; Notable for the following components:   pCO2, Ven 37.2 (*)    Acid-base deficit 3.0 (*)    Sodium 134 (*)    Potassium 5.3 (*)    HCT 32.0 (*)    Hemoglobin 10.9 (*)    All other components within normal limits  CBG MONITORING, ED - Abnormal; Notable for the following components:    Glucose-Capillary 267 (*)    All other components within normal limits  HCG, QUANTITATIVE, PREGNANCY  D-DIMER, QUANTITATIVE  BETA-HYDROXYBUTYRIC ACID  CK  TROPONIN I (HIGH SENSITIVITY)  TROPONIN I (HIGH SENSITIVITY)    EKG None  Radiology DG Chest 2 View  Result Date: 02/14/2021 CLINICAL DATA:  Chest pain. EXAM: CHEST - 2 VIEW COMPARISON:  Chest radiograph dated 07/25/2025 FINDINGS: Mild vascular prominence and nodularity may represent mild congestion. Atypical infection is not excluded clinical correlation recommended. No focal consolidation, pleural effusion, or pneumothorax. The cardiac silhouette is within limits. No acute osseous pathology. IMPRESSION: Mild vascular prominence and nodularity may represent mild congestion. Atypical infection is not excluded. No focal consolidation. Electronically Signed   By: Anner Crete M.D.   On: 02/14/2021 21:16   NM Pulmonary Perfusion  Result Date: 02/15/2021 CLINICAL DATA:  Chest pain, shortness of breath, PE suspected EXAM: NUCLEAR MEDICINE PERFUSION LUNG SCAN TECHNIQUE: Perfusion images were obtained in multiple projections after intravenous injection of radiopharmaceutical. Ventilation scans intentionally deferred if perfusion scan and chest x-ray adequate for interpretation during COVID 19 epidemic. RADIOPHARMACEUTICALS:  4.4 mCi Tc-60m MAA IV COMPARISON:  Chest radiographs, 02/14/2021, nuclear scintigraphic pulmonary perfusion scan, 07/26/2020 FINDINGS: Normal, homogeneous perfusion of the lungs. No suspicious perfusion defects. IMPRESSION: Very low probability for pulmonary embolism by modified perfusion only PIOPED criteria (PE absent). Electronically Signed   By: Delanna Ahmadi M.D.   On: 02/15/2021 08:53    Procedures Procedures   Medications Ordered in ED Medications  technetium albumin aggregated (MAA) injection solution 4.4 millicurie (4.4 millicuries Intravenous Contrast Given 02/15/21 0816)  cyclobenzaprine (FLEXERIL) tablet  5 mg (5 mg Oral Given 02/15/21 1143)  HYDROcodone-acetaminophen (NORCO/VICODIN) 5-325 MG per tablet 1 tablet (1 tablet Oral Given 02/15/21 1143)  pregabalin (LYRICA) capsule 150 mg (150 mg Oral Given 02/15/21 1143)  insulin aspart protamine- aspart (NOVOLOG MIX 70/30) injection 25 Units (25 Units Subcutaneous Given 02/15/21 1246)    ED Course  I have reviewed the triage vital signs and the nursing notes.  Pertinent labs & imaging results that were available during my care of the patient were reviewed by me and considered in my medical decision making (see chart for details).  Clinical Course as of 02/15/21 1344  Thu Feb 15, 2021  1045 45 yo female with complaint of: COVID at beginning of the month, 20lb weight gain, given lasix for this. Now with bilateral leg pain, cramping, 10/10, with SHOB, no cough/fever. Concerned for PE or DVT.  Scheduled for TTE or abdominal US but unable to get this done as an interpreter was not available.  NM scan ordered in triage for inability to have contrast due to renal function, low probability for PE. [LM]  1340 Patient is contacted her nephrologist who requests CK, this seems reasonable as patient does have lower leg pain with a slight increase in her creatinine.  Fortunately, her CK is  reassuring at 2. Labs reviewed with Dr. Jeanell Sparrow, ER attending, vitals reviewed  Her nearly 18-hour stay in the emergency room.  Patient has been stable. Plan is for patient to follow-up with her care team, return to ER for worsening or concerning symptoms.  Regarding her chest x-ray, recent COVID, likely residual, she is afebrile with a normal white blood cell count, and O2 sat of 98% on room air. Dimer negative, doubt DVT. Discussed her Cr of 3.29, up trending, patient is aware and closely monitored. Hypertension, seen by her cardiologist 2 days ago with plan to add amlodipine back however patient was going to discuss this with her nephrologist prior to doing this. [LM]     Clinical Course User Index [LM] Roque Lias   MDM Rules/Calculators/A&P                           Final Clinical Impression(s) / ED Diagnoses Final diagnoses:  Pain in both lower extremities  Shortness of breath  Chronic kidney disease, unspecified CKD stage  Hypertension, unspecified type    Rx / DC Orders ED Discharge Orders     None        Tacy Learn, PA-C 02/20/21 1313    Pattricia Boss, MD 02/21/21 1115

## 2021-02-15 NOTE — ED Notes (Signed)
Pt is currently at [facility] and will be transported to room 5 once finished.

## 2021-02-15 NOTE — Discharge Instructions (Signed)
Follow-up with your care team.  Return to the emergency room for worsening or concerning symptoms.

## 2021-02-15 NOTE — ED Notes (Signed)
Walked patient to the bedroom patient  did well

## 2021-02-17 MED ORDER — AMLODIPINE BESYLATE 5 MG PO TABS: 5.0000 mg | ORAL_TABLET | Freq: Every day | ORAL | 3 refills | Status: DC

## 2021-02-17 NOTE — Addendum Note (Signed)
Addended by: Lawerance Cruel L on: 02/17/2021 10:58 AM   Modules accepted: Orders

## 2021-02-19 ENCOUNTER — Other Ambulatory Visit: Payer: Self-pay

## 2021-02-19 ENCOUNTER — Ambulatory Visit: Payer: Medicare Other

## 2021-02-19 DIAGNOSIS — R0609 Other forms of dyspnea: Secondary | ICD-10-CM

## 2021-03-01 ENCOUNTER — Ambulatory Visit
Admit: 2021-03-01 | Discharge: 2021-03-01 | Disposition: A | Discharge status: Home / Self Care | Payer: Medicare Other | Attending: Nephrology | Admitting: Nephrology

## 2021-03-01 DIAGNOSIS — N184 Chronic kidney disease, stage 4 (severe): Secondary | ICD-10-CM

## 2021-03-07 ENCOUNTER — Other Ambulatory Visit: Payer: Self-pay

## 2021-03-07 DIAGNOSIS — N183 Chronic kidney disease, stage 3 unspecified: Secondary | ICD-10-CM

## 2021-03-08 ENCOUNTER — Encounter: Payer: Self-pay | Admitting: Cardiology

## 2021-03-09 ENCOUNTER — Other Ambulatory Visit: Payer: Self-pay

## 2021-03-09 MED ORDER — AMLODIPINE BESYLATE 5 MG PO TABS: 5.0000 mg | ORAL_TABLET | Freq: Every day | ORAL | 3 refills | Status: DC

## 2021-04-04 ENCOUNTER — Ambulatory Visit (INDEPENDENT_AMBULATORY_CARE_PROVIDER_SITE_OTHER)
Admission: RE | Admit: 2021-04-04 | Discharge: 2021-04-04 | Disposition: A | Discharge status: Home / Self Care | Payer: Medicare Other | Source: Ambulatory Visit | Attending: Vascular Surgery | Admitting: Vascular Surgery

## 2021-04-04 ENCOUNTER — Other Ambulatory Visit: Payer: Self-pay

## 2021-04-04 ENCOUNTER — Ambulatory Visit (HOSPITAL_COMMUNITY)
Admission: RE | Admit: 2021-04-04 | Discharge: 2021-04-04 | Disposition: A | Discharge status: Home / Self Care | Payer: Medicare Other | Source: Ambulatory Visit | Attending: Vascular Surgery | Admitting: Vascular Surgery

## 2021-04-04 ENCOUNTER — Ambulatory Visit (INDEPENDENT_AMBULATORY_CARE_PROVIDER_SITE_OTHER): Payer: Medicare Other | Admitting: Vascular Surgery

## 2021-04-04 ENCOUNTER — Encounter: Payer: Self-pay | Admitting: Vascular Surgery

## 2021-04-04 VITALS — BP 132/82 | HR 93 | Temp 98.0°F | Resp 20 | Ht 68.0 in | Wt 193.0 lb

## 2021-04-04 DIAGNOSIS — N183 Chronic kidney disease, stage 3 unspecified: Secondary | ICD-10-CM

## 2021-04-04 NOTE — H&P (View-Only) (Signed)
Patient ID: Brandi Love, female   DOB: 03-Jan-1976, 45 y.o.   MRN: [id number]  Reason for Consult: New Patient (Initial Visit)   Referred by Bernerd Limbo, MD  Subjective:     HPI:  Brandi Love is a 45 y.o. female history of diabetes does not have any personal or family history of kidney failure.  She states that her initial kidney failure started with an aspirin overdose many years ago now her diabetes has got her kidneys near the end of failure with diagnosis of stage III chronic kidney disease.  She is right-hand dominant.  She has had bilateral lumpectomies in the past without any axillary node sampling per her best memory.  No history of ports or pacemakers no history of upper extremity surgery.  She does not take blood thinners.  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"   MDD (major depressive disorder)    Migraine    "frequency varies" (09/18/2016)   OCD (obsessive compulsive disorder)    Pneumonia 2011   PONV (postoperative nausea and vomiting)    Pulmonary embolism ([place]) 10/2001   Seizures ([place]) 09/2001 X 1   "day after my hysterectomy"   Seizures ([place])    Self mutilating behavior    Tachycardia    Family History  Problem Relation Age of Onset   Cancer Father    Hyperlipidemia Father    Osteoarthritis Mother    Hyperlipidemia Mother    Past Surgical History:  Procedure Laterality Date   ABDOMINAL HYSTERECTOMY  09/2001   'except for right ovary'   BREAST LUMPECTOMY Left 2000   benign    BREAST LUMPECTOMY Right 2002   benign   COCHLEAR IMPLANT Bilateral 2004-2010   "right-left"   COLONOSCOPY  06/2015    Dr Rolm Bookbinder at [place] for rectal bleeding: internal hemorrhoids, small anal fissure.     ESOPHAGOGASTRODUODENOSCOPY N/A 10/07/2016   Procedure: ESOPHAGOGASTRODUODENOSCOPY (EGD);  Surgeon: Ladene Artist, MD;  Location: [facility]COPY;  Service: Endoscopy;  Laterality: N/A;   EYE MUSCLE SURGERY Left 1980   "lazy eye"   EYE MUSCLE SURGERY Right 1990   "lazy eye"   LAPAROSCOPIC ABDOMINAL EXPLORATION  2001 and 2002   for endometriosis   OOPHORECTOMY Right 08/2019    Short Social History:  Social History   Tobacco Use   Smoking status: Never   Smokeless tobacco: Never  Substance Use Topics   Alcohol use: Never    Allergies  Allergen Reactions   Fish Oil Anaphylaxis    Facial edema, itching   Iodinated Diagnostic Agents Anaphylaxis    'cant breathe'   Other Itching, Swelling, Rash and Anaphylaxis    Unable to take due to renal insufficency Unable to ttake due to renal insufficency Eyes swell 'cant breathe' 'cant breathe' 'cant breathe' 'cant breathe' Other reaction(s): Other (See Comments) Patient stated she can not  take because she has renal insufficiency Unable to ttake due to renal insufficency 'cant breathe' Other reaction(s): Other (See Comments) [place] Food   Phenylephrine-Guaifenesin Shortness Of Breath and Swelling    Throat swelling   Propoxyphene Anaphylaxis   Hydromorphone Hcl Itching    Redness, can use paper tape Unable to ttake due to renal insufficency Redness, can use paper tape Unable to ttake due to renal insufficency   Abilify [Aripiprazole] Nausea And Vomiting   Bee Venom     Other reaction(s): Other (See Comments)   Dhea [Nutritional Supplements] Nausea And Vomiting and Swelling   Dihydroergotamine Nausea And Vomiting   Divalproex Sodium Nausea And Vomiting and Other (See Comments)    Weight gain   Doxycycline  Nausea And Vomiting   Erythromycin Nausea And Vomiting   Indomethacin Nausea And Vomiting   Lactose Intolerance (Gi) Nausea And Vomiting   Risperidone And Related Other (See Comments)    Out of body feeling   Shellfish Allergy Itching and Swelling    Eyes swell   Statins Other (See Comments)    Cannot tolerate   Tape Other (See Comments)    Redness/ please use paper tape   Dihydroergotamine Nausea And Vomiting   Lactose Intolerance (Gi) Nausea And Vomiting   Nsaids Other (See Comments)    Unable to ttake due to renal insufficency   Sulfa Antibiotics Itching    Redness/ please use paper tape Redness, can use paper tape   Tape Other (See Comments)    Redness, can use paper tape    Current Outpatient Medications  Medication Sig Dispense Refill   acetaminophen (TYLENOL) 500 MG tablet Take 1,000-3,000 mg by mouth See admin instructions. Take 2 tablets in the morning, and  5 tablets at bedtime     albuterol (VENTOLIN HFA) 108 (90 Base) MCG/ACT inhaler Inhale 1 puff into the lungs every 6 (six) hours as needed for wheezing or shortness of breath. 8 g 0   allopurinol (ZYLOPRIM) 300 MG tablet Take 300 mg by mouth at bedtime.     amLODipine (NORVASC) 5 MG tablet Take 1 tablet (5 mg total) by mouth daily. 30 tablet 3   aspirin EC 81 MG tablet Take 81 mg by mouth daily.     carvedilol (COREG) 6.25 MG tablet Take 1 tablet (6.25 mg total) by mouth 2 (two) times daily with a meal. 180 tablet 3   Cholecalciferol 1.25 MG (50000 UT) capsule Take 1 capsule by mouth once a week.     Cinnamon 500 MG TABS Take 2,000 mg by mouth in the morning and at bedtime.     cyclobenzaprine (FLEXERIL) 5 MG tablet Take 1 tablet by mouth 3 (three) times daily as needed.     D3-50 1.25 MG (50000 UT) capsule Take 50,000 Units by mouth every Monday.     dicyclomine (BENTYL) 20 MG tablet Take 1 tablet (20 mg total) by mouth 2 (two) times daily. (Patient taking differently: Take 20 mg by mouth 2 (two) times daily as needed  for spasms.) 20 tablet 0   docusate sodium (COLACE) 100 MG capsule Take 200 mg by mouth 2 (two) times daily.     estradiol (CLIMARA - DOSED IN MG/24 HR) 0.05 mg/24hr patch Place 0.05 mg onto the skin once a week.     fluticasone (FLONASE) 50 MCG/ACT nasal spray Place 1 spray into both nostrils daily as needed for allergies or rhinitis.     furosemide (LASIX) 40 MG tablet Take 1 tablet (40 mg  total) by mouth daily. (Patient taking differently: Take 40 mg by mouth as needed.) 15 tablet 0   gemfibrozil (LOPID) 600 MG tablet Take 300-600 mg by mouth See admin instructions. 600 mg in the morning and 300 mg in the evening     glimepiride (AMARYL) 4 MG tablet Take 4 mg by mouth daily.     HYDROcodone-acetaminophen (NORCO/VICODIN) 5-325 MG tablet Take 1 tablet by mouth every 6 (six) hours as needed for moderate pain or severe pain. 20 tablet 0   Insulin Aspart Prot & Aspart (NOVOLOG MIX 70/30 FLEXPEN Nectar) Inject 45 Units into the skin daily with supper.     INVOKANA 300 MG TABS tablet Take 300 mg by mouth every morning.     levothyroxine (SYNTHROID) 150 MCG tablet Take 150 mcg by mouth daily.      lurasidone (LATUDA) 20 MG TABS tablet Take 20 mg by mouth at bedtime.     magnesium oxide (MAG-OX) 400 (240 Mg) MG tablet Take 1 tablet by mouth 2 (two) times daily.     meclizine (ANTIVERT) 25 MG tablet Take 25 mg by mouth 3 (three) times daily as needed for dizziness.     Melatonin 5 MG TABS Take 10 mg by mouth at bedtime. One 5 mg pill and one 5 mg tab     metoCLOPramide (REGLAN) 10 MG tablet Take 1 tablet (10 mg total) by mouth every 6 (six) hours as needed for nausea or vomiting. 12 tablet 0   Multiple Vitamin (MULTIVITAMIN WITH MINERALS) TABS Take 1 tablet by mouth daily.     NOVOLOG FLEXPEN 100 UNIT/ML FlexPen      pantoprazole (PROTONIX) 40 MG tablet Take 40 mg by mouth daily.     polyethylene glycol (MIRALAX / GLYCOLAX) 17 g packet Take 17 g by mouth daily. 14 each 0   pregabalin (LYRICA) 150 MG capsule  Take 1 capsule (150 mg total) by mouth daily.     promethazine (PHENERGAN) 25 MG tablet Take 25 mg by mouth every 8 (eight) hours as needed for nausea or vomiting.     sertraline (ZOLOFT) 50 MG tablet Take 50 mg by mouth daily.     sodium bicarbonate 650 MG tablet Take 1 tablet (650 mg total) by mouth 3 (three) times daily. 30 tablet 0   vitamin C (ASCORBIC ACID) 500 MG tablet Take 500 mg by mouth daily.     No current facility-administered medications for this visit.    Review of Systems  Constitutional:  Constitutional negative. HENT: HENT negative.  Eyes: Eyes negative.  Cardiovascular: Cardiovascular negative.  GI: Gastrointestinal negative.  Musculoskeletal: Musculoskeletal negative.  Skin: Skin negative.  Neurological: Neurological negative. Hematologic: Hematologic/lymphatic negative.  Psychiatric: Positive for depressed mood.       Objective:  Objective   Vitals:   04/04/21 1455  BP: 132/82  Pulse: 93  Resp: 20  Temp: 98 F (36.7 C)  SpO2: 95%  Weight: 193 lb (87.5 kg)  Height: 5\' 8"  (1.727 m)   Body mass index is 29.35 kg/m.  Physical Exam Constitutional:      Appearance: Normal appearance.  HENT:     Head: Normocephalic.     Nose: Nose normal.  Eyes:     Pupils: Pupils are equal, round, and reactive to light.  Cardiovascular:     Rate and Rhythm: Normal rate.     Pulses: Normal pulses.  Pulmonary:     Effort: Pulmonary effort is normal.  Abdominal:     General: Abdomen  is flat.     Palpations: Abdomen is soft.  Musculoskeletal:        General: Normal range of motion.     Cervical back: Normal range of motion and neck supple.     Right lower leg: No edema.     Left lower leg: No edema.  Skin:    General: Skin is warm and dry.  Neurological:     General: No focal deficit present.     Mental Status: She is alert.  Psychiatric:        Mood and Affect: Mood normal.    Data: +-----------------+-------------+----------+---------+   Right  Cephalic    Diameter (cm) Depth (cm) Findings    +-----------------+-------------+----------+---------+   Shoulder              0.44                             +-----------------+-------------+----------+---------+   Prox upper arm     0.45 / 0.40                         +-----------------+-------------+----------+---------+   Mid upper arm         0.38                 branching   +-----------------+-------------+----------+---------+   Dist upper arm        0.42                             +-----------------+-------------+----------+---------+   Antecubital fossa     0.41                 branching   +-----------------+-------------+----------+---------+   Prox forearm          0.16                             +-----------------+-------------+----------+---------+   Mid forearm        0.27 / 0.34                         +-----------------+-------------+----------+---------+   Dist forearm          0.32                             +-----------------+-------------+----------+---------+   +-----------------+-------------+----------+---------+   Right Basilic     Diameter (cm) Depth (cm) Findings    +-----------------+-------------+----------+---------+   Prox upper arm     0.47 / 0.55                         +-----------------+-------------+----------+---------+   Mid upper arm         0.57                 branching   +-----------------+-------------+----------+---------+   Dist upper arm        0.63                             +-----------------+-------------+----------+---------+   Antecubital fossa     0.57                 branching   +-----------------+-------------+----------+---------+   Prox  forearm          0.48                             +-----------------+-------------+----------+---------+   +-----------------+------------------+----------+------------+   Left Cephalic       Diameter (cm)    Depth (cm)   Findings      +-----------------+------------------+----------+------------+   Shoulder                 0.29                                  +-----------------+------------------+----------+------------+   Prox upper arm                                       NV        +-----------------+------------------+----------+------------+   Mid upper arm                                        NV        +-----------------+------------------+----------+------------+   Dist upper arm                                       NV        +-----------------+------------------+----------+------------+   Antecubital fossa        0.15                        NV        +-----------------+------------------+----------+------------+   Prox forearm      0.13 / 0.32 / 0.46            crosses over   +-----------------+------------------+----------+------------+   Mid forearm                                          NV        +-----------------+------------------+----------+------------+   Dist forearm             0.25                                  +-----------------+------------------+----------+------------+   +-----------------+-------------+----------+--------+   Left Basilic      Diameter (cm) Depth (cm) Findings   +-----------------+-------------+----------+--------+   Prox upper arm        0.60                            +-----------------+-------------+----------+--------+   Mid upper arm         0.63                            +-----------------+-------------+----------+--------+   Dist upper arm        0.64                            +-----------------+-------------+----------+--------+  Antecubital fossa     0.40                            +-----------------+-------------+----------+--------+   Prox forearm          0.29                            +-----------------+-------------+----------+--------+   Right Pre-Dialysis Findings:  +-----------------------+----------+--------------------+---------+--------  +    Location                PSV (cm/s) Intralum. Diam. (cm) Waveform   Comments   +-----------------------+----------+--------------------+---------+--------  +   Brachial Antecub. fossa 78         0.46                 triphasic              +-----------------------+----------+--------------------+---------+--------  +   Radial Art at Wrist     56         0.22                 triphasic              +-----------------------+----------+--------------------+---------+--------  +   Ulnar Art at Wrist      87         0.24                 triphasic              +-----------------------+----------+--------------------+---------+--------  +    Left Pre-Dialysis Findings:  +-----------------------+----------+--------------------+---------+--------  +   Location                PSV (cm/s) Intralum. Diam. (cm) Waveform   Comments   +-----------------------+----------+--------------------+---------+--------  +   Brachial Antecub. fossa 65         0.42                 triphasic              +-----------------------+----------+--------------------+---------+--------  +   Radial Art at Wrist     55         0.20                 triphasic              +-----------------------+----------+--------------------+---------+--------  +   Ulnar Art at Wrist      59         0.29                                        +-----------------------+----------+--------------------+---------+--------  +       Assessment/Plan:     45 year old female with chronic kidney disease stage III right-hand-dominant.  Her veins do appear better on the right side both by ultrasound and grossly.  We will plan for right upper extremity fistula in the near future.  I discussed the risk and benefits and alternatives including risk of primary nonfunction, risk of nerve injury, risk of steal, risk of need for further procedures.  She demonstrates good understanding in the presence of interpreter we will get her scheduled shortly  after the holidays.    Waynetta Sandy MD [facility]

## 2021-04-04 NOTE — Progress Notes (Signed)
Patient ID: Brandi Love, female   DOB: 03/08/1976, 45 y.o.   MRN: [id number]  Reason for Consult: New Patient (Initial Visit)   Referred by Bernerd Limbo, MD  Subjective:     HPI:  Brandi Love is a 45 y.o. female history of diabetes does not have any personal or family history of kidney failure.  She states that her initial kidney failure started with an aspirin overdose many years ago now her diabetes has got her kidneys near the end of failure with diagnosis of stage III chronic kidney disease.  She is right-hand dominant.  She has had bilateral lumpectomies in the past without any axillary node sampling per her best memory.  No history of ports or pacemakers no history of upper extremity surgery.  She does not take blood thinners.  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"   MDD (major depressive disorder)    Migraine    "frequency varies" (09/18/2016)   OCD (obsessive compulsive disorder)    Pneumonia 2011   PONV (postoperative nausea and vomiting)    Pulmonary embolism ([place]) 10/2001   Seizures ([facility]) 09/2001 X 1   "day after my hysterectomy"   Seizures ([place])    Self mutilating behavior    Tachycardia    Family History  Problem Relation Age of Onset   Cancer Father    Hyperlipidemia Father    Osteoarthritis Mother    Hyperlipidemia Mother    Past Surgical History:  Procedure Laterality Date   ABDOMINAL HYSTERECTOMY  09/2001   'except for right ovary'   BREAST LUMPECTOMY Left 2000   benign    BREAST LUMPECTOMY Right 2002   benign   COCHLEAR IMPLANT Bilateral 2004-2010   "right-left"   COLONOSCOPY  06/2015    Dr Rolm Bookbinder at [facility] for rectal bleeding: internal hemorrhoids, small anal fissure.     ESOPHAGOGASTRODUODENOSCOPY N/A 10/07/2016   Procedure: ESOPHAGOGASTRODUODENOSCOPY (EGD);  Surgeon: Ladene Artist, MD;  Location: [facility] ENDOSCOPY;  Service: Endoscopy;  Laterality: N/A;   EYE MUSCLE SURGERY Left 1980   "lazy eye"   EYE MUSCLE SURGERY Right 1990   "lazy eye"   LAPAROSCOPIC ABDOMINAL EXPLORATION  2001 and 2002   for endometriosis   OOPHORECTOMY Right 08/2019    Short Social History:  Social History   Tobacco Use   Smoking status: Never   Smokeless tobacco: Never  Substance Use Topics   Alcohol use: Never    Allergies  Allergen Reactions   Fish Oil Anaphylaxis    Facial edema, itching   Iodinated Diagnostic Agents Anaphylaxis    'cant breathe'   Other Itching, Swelling, Rash and Anaphylaxis    Unable to take due to renal insufficency Unable to ttake due to renal insufficency Eyes swell 'cant breathe' 'cant breathe' 'cant breathe' 'cant breathe' Other reaction(s): Other (See Comments) Patient stated she can not  take because she has renal insufficiency Unable to ttake due to renal insufficency 'cant breathe' Other reaction(s): Other (See Comments) [place] Food   Phenylephrine-Guaifenesin Shortness Of Breath and Swelling    Throat swelling   Propoxyphene Anaphylaxis   Hydromorphone Hcl Itching    Redness, can use paper tape Unable to ttake due to renal insufficency Redness, can use paper tape Unable to ttake due to renal insufficency   Abilify [Aripiprazole] Nausea And Vomiting   Bee Venom     Other reaction(s): Other (See Comments)   Dhea [Nutritional Supplements] Nausea And Vomiting and Swelling   Dihydroergotamine Nausea And Vomiting   Divalproex Sodium Nausea And Vomiting and Other (See Comments)    Weight gain   Doxycycline  Nausea And Vomiting   Erythromycin Nausea And Vomiting   Indomethacin Nausea And Vomiting   Lactose Intolerance (Gi) Nausea And Vomiting   Risperidone And Related Other (See Comments)    Out of body feeling   Shellfish Allergy Itching and Swelling    Eyes swell   Statins Other (See Comments)    Cannot tolerate   Tape Other (See Comments)    Redness/ please use paper tape   Dihydroergotamine Nausea And Vomiting   Lactose Intolerance (Gi) Nausea And Vomiting   Nsaids Other (See Comments)    Unable to ttake due to renal insufficency   Sulfa Antibiotics Itching    Redness/ please use paper tape Redness, can use paper tape   Tape Other (See Comments)    Redness, can use paper tape    Current Outpatient Medications  Medication Sig Dispense Refill   acetaminophen (TYLENOL) 500 MG tablet Take 1,000-3,000 mg by mouth See admin instructions. Take 2 tablets in the morning, and  5 tablets at bedtime     albuterol (VENTOLIN HFA) 108 (90 Base) MCG/ACT inhaler Inhale 1 puff into the lungs every 6 (six) hours as needed for wheezing or shortness of breath. 8 g 0   allopurinol (ZYLOPRIM) 300 MG tablet Take 300 mg by mouth at bedtime.     amLODipine (NORVASC) 5 MG tablet Take 1 tablet (5 mg total) by mouth daily. 30 tablet 3   aspirin EC 81 MG tablet Take 81 mg by mouth daily.     carvedilol (COREG) 6.25 MG tablet Take 1 tablet (6.25 mg total) by mouth 2 (two) times daily with a meal. 180 tablet 3   Cholecalciferol 1.25 MG (50000 UT) capsule Take 1 capsule by mouth once a week.     Cinnamon 500 MG TABS Take 2,000 mg by mouth in the morning and at bedtime.     cyclobenzaprine (FLEXERIL) 5 MG tablet Take 1 tablet by mouth 3 (three) times daily as needed.     D3-50 1.25 MG (50000 UT) capsule Take 50,000 Units by mouth every Monday.     dicyclomine (BENTYL) 20 MG tablet Take 1 tablet (20 mg total) by mouth 2 (two) times daily. (Patient taking differently: Take 20 mg by mouth 2 (two) times daily as needed  for spasms.) 20 tablet 0   docusate sodium (COLACE) 100 MG capsule Take 200 mg by mouth 2 (two) times daily.     estradiol (CLIMARA - DOSED IN MG/24 HR) 0.05 mg/24hr patch Place 0.05 mg onto the skin once a week.     fluticasone (FLONASE) 50 MCG/ACT nasal spray Place 1 spray into both nostrils daily as needed for allergies or rhinitis.     furosemide (LASIX) 40 MG tablet Take 1 tablet (40 mg  total) by mouth daily. (Patient taking differently: Take 40 mg by mouth as needed.) 15 tablet 0   gemfibrozil (LOPID) 600 MG tablet Take 300-600 mg by mouth See admin instructions. 600 mg in the morning and 300 mg in the evening     glimepiride (AMARYL) 4 MG tablet Take 4 mg by mouth daily.     HYDROcodone-acetaminophen (NORCO/VICODIN) 5-325 MG tablet Take 1 tablet by mouth every 6 (six) hours as needed for moderate pain or severe pain. 20 tablet 0   Insulin Aspart Prot & Aspart (NOVOLOG MIX 70/30 FLEXPEN [place]) Inject 45 Units into the skin daily with supper.     INVOKANA 300 MG TABS tablet Take 300 mg by mouth every morning.     levothyroxine (SYNTHROID) 150 MCG tablet Take 150 mcg by mouth daily.      lurasidone (LATUDA) 20 MG TABS tablet Take 20 mg by mouth at bedtime.     magnesium oxide (MAG-OX) 400 (240 Mg) MG tablet Take 1 tablet by mouth 2 (two) times daily.     meclizine (ANTIVERT) 25 MG tablet Take 25 mg by mouth 3 (three) times daily as needed for dizziness.     Melatonin 5 MG TABS Take 10 mg by mouth at bedtime. One 5 mg pill and one 5 mg tab     metoCLOPramide (REGLAN) 10 MG tablet Take 1 tablet (10 mg total) by mouth every 6 (six) hours as needed for nausea or vomiting. 12 tablet 0   Multiple Vitamin (MULTIVITAMIN WITH MINERALS) TABS Take 1 tablet by mouth daily.     NOVOLOG FLEXPEN 100 UNIT/ML FlexPen      pantoprazole (PROTONIX) 40 MG tablet Take 40 mg by mouth daily.     polyethylene glycol (MIRALAX / GLYCOLAX) 17 g packet Take 17 g by mouth daily. 14 each 0   pregabalin (LYRICA) 150 MG capsule  Take 1 capsule (150 mg total) by mouth daily.     promethazine (PHENERGAN) 25 MG tablet Take 25 mg by mouth every 8 (eight) hours as needed for nausea or vomiting.     sertraline (ZOLOFT) 50 MG tablet Take 50 mg by mouth daily.     sodium bicarbonate 650 MG tablet Take 1 tablet (650 mg total) by mouth 3 (three) times daily. 30 tablet 0   vitamin C (ASCORBIC ACID) 500 MG tablet Take 500 mg by mouth daily.     No current facility-administered medications for this visit.    Review of Systems  Constitutional:  Constitutional negative. HENT: HENT negative.  Eyes: Eyes negative.  Cardiovascular: Cardiovascular negative.  GI: Gastrointestinal negative.  Musculoskeletal: Musculoskeletal negative.  Skin: Skin negative.  Neurological: Neurological negative. Hematologic: Hematologic/lymphatic negative.  Psychiatric: Positive for depressed mood.       Objective:  Objective   Vitals:   04/04/21 1455  BP: 132/82  Pulse: 93  Resp: 20  Temp: 98 F (36.7 C)  SpO2: 95%  Weight: 193 lb (87.5 kg)  Height: 5\' 8"  (1.727 m)   Body mass index is 29.35 kg/m.  Physical Exam Constitutional:      Appearance: Normal appearance.  HENT:     Head: Normocephalic.     Nose: Nose normal.  Eyes:     Pupils: Pupils are equal, round, and reactive to light.  Cardiovascular:     Rate and Rhythm: Normal rate.     Pulses: Normal pulses.  Pulmonary:     Effort: Pulmonary effort is normal.  Abdominal:     General: Abdomen  is flat.     Palpations: Abdomen is soft.  Musculoskeletal:        General: Normal range of motion.     Cervical back: Normal range of motion and neck supple.     Right lower leg: No edema.     Left lower leg: No edema.  Skin:    General: Skin is warm and dry.  Neurological:     General: No focal deficit present.     Mental Status: She is alert.  Psychiatric:        Mood and Affect: Mood normal.    Data: +-----------------+-------------+----------+---------+   Right  Cephalic    Diameter (cm) Depth (cm) Findings    +-----------------+-------------+----------+---------+   Shoulder              0.44                             +-----------------+-------------+----------+---------+   Prox upper arm     0.45 / 0.40                         +-----------------+-------------+----------+---------+   Mid upper arm         0.38                 branching   +-----------------+-------------+----------+---------+   Dist upper arm        0.42                             +-----------------+-------------+----------+---------+   Antecubital fossa     0.41                 branching   +-----------------+-------------+----------+---------+   Prox forearm          0.16                             +-----------------+-------------+----------+---------+   Mid forearm        0.27 / 0.34                         +-----------------+-------------+----------+---------+   Dist forearm          0.32                             +-----------------+-------------+----------+---------+   +-----------------+-------------+----------+---------+   Right Basilic     Diameter (cm) Depth (cm) Findings    +-----------------+-------------+----------+---------+   Prox upper arm     0.47 / 0.55                         +-----------------+-------------+----------+---------+   Mid upper arm         0.57                 branching   +-----------------+-------------+----------+---------+   Dist upper arm        0.63                             +-----------------+-------------+----------+---------+   Antecubital fossa     0.57                 branching   +-----------------+-------------+----------+---------+   Prox  forearm          0.48                             +-----------------+-------------+----------+---------+   +-----------------+------------------+----------+------------+   Left Cephalic       Diameter (cm)    Depth (cm)   Findings      +-----------------+------------------+----------+------------+   Shoulder                 0.29                                  +-----------------+------------------+----------+------------+   Prox upper arm                                       NV        +-----------------+------------------+----------+------------+   Mid upper arm                                        NV        +-----------------+------------------+----------+------------+   Dist upper arm                                       NV        +-----------------+------------------+----------+------------+   Antecubital fossa        0.15                        NV        +-----------------+------------------+----------+------------+   Prox forearm      0.13 / 0.32 / 0.46            crosses over   +-----------------+------------------+----------+------------+   Mid forearm                                          NV        +-----------------+------------------+----------+------------+   Dist forearm             0.25                                  +-----------------+------------------+----------+------------+   +-----------------+-------------+----------+--------+   Left Basilic      Diameter (cm) Depth (cm) Findings   +-----------------+-------------+----------+--------+   Prox upper arm        0.60                            +-----------------+-------------+----------+--------+   Mid upper arm         0.63                            +-----------------+-------------+----------+--------+   Dist upper arm        0.64                            +-----------------+-------------+----------+--------+  Antecubital fossa     0.40                            +-----------------+-------------+----------+--------+   Prox forearm          0.29                            +-----------------+-------------+----------+--------+   Right Pre-Dialysis Findings:  +-----------------------+----------+--------------------+---------+--------  +    Location                PSV (cm/s) Intralum. Diam. (cm) Waveform   Comments   +-----------------------+----------+--------------------+---------+--------  +   Brachial Antecub. fossa 78         0.46                 triphasic              +-----------------------+----------+--------------------+---------+--------  +   Radial Art at Wrist     56         0.22                 triphasic              +-----------------------+----------+--------------------+---------+--------  +   Ulnar Art at Wrist      87         0.24                 triphasic              +-----------------------+----------+--------------------+---------+--------  +    Left Pre-Dialysis Findings:  +-----------------------+----------+--------------------+---------+--------  +   Location                PSV (cm/s) Intralum. Diam. (cm) Waveform   Comments   +-----------------------+----------+--------------------+---------+--------  +   Brachial Antecub. fossa 65         0.42                 triphasic              +-----------------------+----------+--------------------+---------+--------  +   Radial Art at Wrist     55         0.20                 triphasic              +-----------------------+----------+--------------------+---------+--------  +   Ulnar Art at Wrist      59         0.29                                        +-----------------------+----------+--------------------+---------+--------  +       Assessment/Plan:     45 year old female with chronic kidney disease stage III right-hand-dominant.  Her veins do appear better on the right side both by ultrasound and grossly.  We will plan for right upper extremity fistula in the near future.  I discussed the risk and benefits and alternatives including risk of primary nonfunction, risk of nerve injury, risk of steal, risk of need for further procedures.  She demonstrates good understanding in the presence of interpreter we will get her scheduled shortly  after the holidays.    Waynetta Sandy MD [facility]

## 2021-04-24 NOTE — Progress Notes (Signed)
Primary Physician/Referring:  Bernerd Limbo, MD  Patient ID: Brandi Love, female    DOB: March 06, 1976, 46 y.o.   MRN: [id number]  Chief Complaint  Patient presents with   Hypertension   Follow-up   Shortness of Breath   HPI:    Brandi Love  is a 46 y.o. eating disorder,  depression with psychosis, borderline personality disorder, OCD, hypothyroidism, deafness with cochlear implants, and chronic kidney disease stage III, and is attributed to NSAID use and also patient once try to commit suicide by consuming excessive amounts of aspirin in the remote past, DM.  She is being followed at [facility] for familial hypertriglyceridemia.   Patient was last seen in our office 07/10/2020 with shortness of breath which time she was advised to follow-up in the emergency department given concerns for pulmonary embolism.  Pulmonary perfusion study was negative for PE at that time, however she was admitted to the hospital for AKI.  She has had multiple ED evaluations since then for various concerns including AKI and flank pain.  She has had multiple hospitalizations in the past for eating disorder and depression.  Patient presents for 8-week follow-up of hypertension and results of cardiac testing.  Last office visit patient's primary concern was dyspnea on exertion, therefore ordered Echocardiogram which revealed normal LVEF and mild valvular disease as well as BNP which was only minimally elevated.  Also at last office visit given uncontrolled hypertension reinitiated amlodipine 10 mg p.o. daily.  Since last office visit patient has been evaluated by vascular surgery for fistula creation, plans to undergo surgery on 05/04/2021.  Patient is presently taking Lasix 80 mg in the morning and 40 mg in the afternoon as directed by nephrology.  She continues to have intermittent swelling of her legs, however this is fairly well controlled with current Lasix dosing and use of compression stockings.  She is  tolerating addition of amlodipine without issue.  She does continue to have mild dyspnea on exertion which is likely multifactorial including deconditioning as she is inactive. Echo and BNP were essential normal.   Past Medical History:  Diagnosis Date   Anemia    Anorexia    Anxiety    Arthritis    "hands" (09/18/2016)   Chronic kidney disease    Chronic lower back pain    Chronic renal insufficiency    Deaf    Deafness    Depression    Diabetes ([place])    "borderline"   Eating disorder    Endometriosis    GERD (gastroesophageal reflux disease)    History of kidney stones    Hyperlipidemia    Hypothyroidism    "born without a thyroid gland"   MDD (major depressive disorder)    Migraine    "frequency varies" (09/18/2016)   OCD (obsessive compulsive disorder)    Pneumonia 2011   PONV (postoperative nausea and vomiting)    Pulmonary embolism ([place]) 10/2001   Seizures ([place]) 09/2001 X 1   "day after my hysterectomy"   Seizures ([place])    Self mutilating behavior    Tachycardia    Past Surgical History:  Procedure Laterality Date   ABDOMINAL HYSTERECTOMY  09/2001   'except for right ovary'   BREAST LUMPECTOMY Left 2000   benign   BREAST LUMPECTOMY Right 2002   benign   COCHLEAR IMPLANT Bilateral 2004-2010   "right-left"   COLONOSCOPY  06/2015    Dr Rolm Bookbinder at [facility] for rectal bleeding: internal hemorrhoids, small anal  fissure.     ESOPHAGOGASTRODUODENOSCOPY N/A 10/07/2016   Procedure: ESOPHAGOGASTRODUODENOSCOPY (EGD);  Surgeon: Ladene Artist, MD;  Location: [facility] ENDOSCOPY;  Service: Endoscopy;  Laterality: N/A;   EYE MUSCLE SURGERY Left 1980   "lazy eye"   EYE MUSCLE SURGERY Right 1990   "lazy eye"   LAPAROSCOPIC ABDOMINAL EXPLORATION  2001 and 2002   for endometriosis   OOPHORECTOMY Right 08/2019   Family History  Problem Relation Age of Onset   Cancer Father    Hyperlipidemia Father    Osteoarthritis Mother    Hyperlipidemia Mother     Social  History   Tobacco Use   Smoking status: Never   Smokeless tobacco: Never  Substance Use Topics   Alcohol use: Never   Marital Status: Single  ROS  Review of Systems  Constitutional: Negative for malaise/fatigue and weight gain.  Cardiovascular:  Positive for dyspnea on exertion (stable). Negative for chest pain (no recurrence), claudication, leg swelling, near-syncope, orthopnea, palpitations, paroxysmal nocturnal dyspnea and syncope.  Respiratory:  Negative for shortness of breath.   Gastrointestinal:  Negative for melena.  Objective  Blood pressure 133/81, pulse 93, temperature 98.2 F (36.8 C), height '5\' 8"'  (1.727 m), weight 194 lb (88 kg), SpO2 95 %.  Vitals with BMI 04/25/2021 04/04/2021 02/15/2021  Height '5\' 8"'  '5\' 8"'  -  Weight 194 lbs 193 lbs -  BMI 34.1 96.22 -  Systolic 297 989 211  Diastolic 81 82 92  Pulse 93 93 105  Some encounter information is confidential and restricted. Go to Review Flowsheets activity to see all data.     Physical Exam Vitals reviewed.  Neck:     Vascular: No carotid bruit.  Cardiovascular:     Rate and Rhythm: Normal rate and regular rhythm.     Pulses: Normal pulses and intact distal pulses.     Heart sounds: S1 normal and S2 normal. No murmur heard.   Gallop present. S4 sounds present.     Comments: No JVD. Pulmonary:     Effort: Pulmonary effort is normal. No respiratory distress.     Breath sounds: Normal breath sounds. No wheezing, rhonchi or rales.  Musculoskeletal:     Right lower leg: Edema (minimal) present.     Left lower leg: Edema (minimal) present.  Neurological:     Mental Status: She is alert.   Laboratory examination:   Recent Labs    07/25/20 2300 11/03/20 1637 02/14/21 2033 02/15/21 0057  NA 137 133* 136 134*  K 4.0 5.4* 5.0 5.3*  CL 105 103 103  --   CO2 18* 18* 20*  --   GLUCOSE 140* 166* 323*  --   BUN 71* 58* 60*  --   CREATININE 2.91* 2.71* 3.29*  --   CALCIUM 9.4 9.3 9.4  --   GFRNONAA 20* 22* 17*   --    CrCl cannot be calculated (Patient's most recent lab result is older than the maximum 21 days allowed.).  CMP Latest Ref Rng & Units 02/15/2021 02/14/2021 11/03/2020  Glucose 70 - 99 mg/dL - 323(H) 166(H)  BUN 6 - 20 mg/dL - 60(H) 58(H)  Creatinine 0.44 - 1.00 mg/dL - 3.29(H) 2.71(H)  Sodium 135 - 145 mmol/L 134(L) 136 133(L)  Potassium 3.5 - 5.1 mmol/L 5.3(H) 5.0 5.4(H)  Chloride 98 - 111 mmol/L - 103 103  CO2 22 - 32 mmol/L - 20(L) 18(L)  Calcium 8.9 - 10.3 mg/dL - 9.4 9.3  Total Protein 6.5 - 8.1 g/dL - - -  Total Bilirubin 0.3 - 1.2 mg/dL - - -  Alkaline Phos 38 - 126 U/L - - -  AST 15 - 41 U/L - - -  ALT 0 - 44 U/L - - -   CBC Latest Ref Rng & Units 02/15/2021 02/14/2021 11/03/2020  WBC 4.0 - 10.5 K/uL - 7.5 8.9  Hemoglobin 12.0 - 15.0 g/dL 10.9(L) 10.3(L) 11.3(L)  Hematocrit 36.0 - 46.0 % 32.0(L) 33.2(L) 32.8(L)  Platelets 150 - 400 K/uL - 356 233    Lipid Panel Recent Labs    07/13/20 0432  CHOL 193  TRIG 1,083*  LDLCALC UNABLE TO CALCULATE IF TRIGLYCERIDE OVER 400 mg/dL  VLDL UNABLE TO CALCULATE IF TRIGLYCERIDE OVER 400 mg/dL  HDL 25*  CHOLHDL 7.7  LDLDIRECT 46    HEMOGLOBIN A1C Lab Results  Component Value Date   HGBA1C 6.8 (H) 07/11/2020   MPG 148.46 07/11/2020   TSH Recent Labs    07/11/20 0404  TSH 0.526    External labs:   Chest x-ray PA and lateral view 07/04/2020: Normal chest x-ray.  D-dimer 07/04/2020: 290 (<500).  Labs 07/07/2020:  BNP normal at 22.4.  Serum glucose 191 mg, BUN 63, creatinine 2.80, EGFR 21 mL, potassium 5.5.  Hb 10.8/HCT 31.0, platelets 249.  Iron studies: TIBC 474, elevated.  Iron saturation markedly reduced at 11.  Labs 11/08/2019:  Total cholesterol 477, triglycerides 4259, HDL 31, LDL not calculated.  Labs 10/28/2019: TSH minimally elevated at 4.680.  Free T4 normal.  Allergies   Allergies  Allergen Reactions   Fish Oil Anaphylaxis    Facial edema, itching   Iodinated Contrast Media Anaphylaxis     'cant breathe'   Other Itching, Swelling, Rash and Anaphylaxis    Unable to take due to renal insufficency Unable to ttake due to renal insufficency Eyes swell 'cant breathe' 'cant breathe' 'cant breathe' 'cant breathe' Other reaction(s): Other (See Comments) Patient stated she can not take because she has renal insufficiency Unable to ttake due to renal insufficency 'cant breathe' Other reaction(s): Other (See Comments) [place] Food   Phenylephrine-Guaifenesin Shortness Of Breath and Swelling    Throat swelling   Propoxyphene Anaphylaxis   Hydromorphone Hcl Itching    Redness, can use paper tape Unable to ttake due to renal insufficency Redness, can use paper tape Unable to ttake due to renal insufficency   Abilify [Aripiprazole] Nausea And Vomiting   Bee Venom     Other reaction(s): Other (See Comments)   Dhea [Nutritional Supplements] Nausea And Vomiting and Swelling   Dihydroergotamine Nausea And Vomiting   Divalproex Sodium Nausea And Vomiting and Other (See Comments)    Weight gain   Doxycycline Nausea And Vomiting   Erythromycin Nausea And Vomiting   Indomethacin Nausea And Vomiting   Lactose Intolerance (Gi) Nausea And Vomiting   Risperidone And Related Other (See Comments)    Out of body feeling   Shellfish Allergy Itching and Swelling    Eyes swell   Statins Other (See Comments)    Cannot tolerate   Tape Other (See Comments)    Redness/ please use paper tape   Dihydroergotamine Nausea And Vomiting   Lactose Intolerance (Gi) Nausea And Vomiting   Nsaids Other (See Comments)    Unable to ttake due to renal insufficency   Sulfa Antibiotics Itching    Redness/ please use paper tape Redness, can use paper tape   Tape Other (See Comments)    Redness, can use paper tape    Medications Prior to  Visit:   Outpatient Medications Prior to Visit  Medication Sig Dispense Refill   acetaminophen (TYLENOL) 500 MG tablet Take 1,000-3,000 mg by mouth See admin  instructions. Take 2 tablets in the morning, and  5 tablets at bedtime     albuterol (VENTOLIN HFA) 108 (90 Base) MCG/ACT inhaler Inhale 1 puff into the lungs every 6 (six) hours as needed for wheezing or shortness of breath. 8 g 0   allopurinol (ZYLOPRIM) 300 MG tablet Take 300 mg by mouth at bedtime.     amLODipine (NORVASC) 5 MG tablet Take 1 tablet (5 mg total) by mouth daily. 30 tablet 3   aspirin EC 81 MG tablet Take 81 mg by mouth daily.     carvedilol (COREG) 6.25 MG tablet Take 1 tablet (6.25 mg total) by mouth 2 (two) times daily with a meal. 180 tablet 3   Cholecalciferol 1.25 MG (50000 UT) capsule Take 1 capsule by mouth once a week.     Cinnamon 500 MG TABS Take 2,000 mg by mouth in the morning and at bedtime.     cyclobenzaprine (FLEXERIL) 5 MG tablet Take 1 tablet by mouth 3 (three) times daily as needed.     D3-50 1.25 MG (50000 UT) capsule Take 50,000 Units by mouth every Monday.     dicyclomine (BENTYL) 20 MG tablet Take 1 tablet (20 mg total) by mouth 2 (two) times daily. (Patient taking differently: Take 20 mg by mouth 2 (two) times daily as needed for spasms.) 20 tablet 0   docusate sodium (COLACE) 100 MG capsule Take 200 mg by mouth 2 (two) times daily.     ELDERBERRY PO Take 150 mg by mouth daily.     estradiol (CLIMARA - DOSED IN MG/24 HR) 0.05 mg/24hr patch Place 0.05 mg onto the skin once a week.     ferrous sulfate 325 (65 FE) MG tablet Take 325 mg by mouth daily with breakfast.     fluticasone (FLONASE) 50 MCG/ACT nasal spray Place 1 spray into both nostrils daily as needed for allergies or rhinitis.     furosemide (LASIX) 40 MG tablet Take 1 tablet (40 mg total) by mouth daily. (Patient taking differently: Take 40 mg by mouth as needed.) 15 tablet 0   gemfibrozil (LOPID) 600 MG tablet Take 300-600 mg by mouth See admin instructions. 600 mg in the morning and 300 mg in the evening     glimepiride (AMARYL) 4 MG tablet Take 4 mg by mouth daily.      HYDROcodone-acetaminophen (NORCO/VICODIN) 5-325 MG tablet Take 1 tablet by mouth every 6 (six) hours as needed for moderate pain or severe pain. 20 tablet 0   Insulin Aspart Prot & Aspart (NOVOLOG MIX 70/30 FLEXPEN [place]) Inject 45 Units into the skin daily with supper.     INVOKANA 300 MG TABS tablet Take 300 mg by mouth every morning.     levothyroxine (SYNTHROID) 150 MCG tablet Take 150 mcg by mouth daily.      lurasidone (LATUDA) 20 MG TABS tablet Take 20 mg by mouth at bedtime.     magnesium oxide (MAG-OX) 400 (240 Mg) MG tablet Take 1 tablet by mouth 2 (two) times daily.     meclizine (ANTIVERT) 25 MG tablet Take 25 mg by mouth 3 (three) times daily as needed for dizziness.     Melatonin 5 MG TABS Take 10 mg by mouth at bedtime. One 5 mg pill and one 5 mg tab     metoCLOPramide (REGLAN)  10 MG tablet Take 1 tablet (10 mg total) by mouth every 6 (six) hours as needed for nausea or vomiting. 12 tablet 0   Multiple Vitamin (MULTIVITAMIN WITH MINERALS) TABS Take 1 tablet by mouth daily.     NOVOLOG FLEXPEN 100 UNIT/ML FlexPen      pantoprazole (PROTONIX) 40 MG tablet Take 40 mg by mouth daily.     polyethylene glycol (MIRALAX / GLYCOLAX) 17 g packet Take 17 g by mouth daily. 14 each 0   pregabalin (LYRICA) 150 MG capsule Take 1 capsule (150 mg total) by mouth daily.     promethazine (PHENERGAN) 25 MG tablet Take 25 mg by mouth every 8 (eight) hours as needed for nausea or vomiting.     sertraline (ZOLOFT) 50 MG tablet Take 50 mg by mouth daily.     sodium bicarbonate 650 MG tablet Take 1 tablet (650 mg total) by mouth 3 (three) times daily. 30 tablet 0   vitamin C (ASCORBIC ACID) 500 MG tablet Take 500 mg by mouth daily.     No facility-administered medications prior to visit.   Final Medications at End of Visit    Current Meds  Medication Sig   acetaminophen (TYLENOL) 500 MG tablet Take 1,000-3,000 mg by mouth See admin instructions. Take 2 tablets in the morning, and  5 tablets at bedtime    albuterol (VENTOLIN HFA) 108 (90 Base) MCG/ACT inhaler Inhale 1 puff into the lungs every 6 (six) hours as needed for wheezing or shortness of breath.   allopurinol (ZYLOPRIM) 300 MG tablet Take 300 mg by mouth at bedtime.   amLODipine (NORVASC) 5 MG tablet Take 1 tablet (5 mg total) by mouth daily.   aspirin EC 81 MG tablet Take 81 mg by mouth daily.   carvedilol (COREG) 6.25 MG tablet Take 1 tablet (6.25 mg total) by mouth 2 (two) times daily with a meal.   Cholecalciferol 1.25 MG (50000 UT) capsule Take 1 capsule by mouth once a week.   Cinnamon 500 MG TABS Take 2,000 mg by mouth in the morning and at bedtime.   cyclobenzaprine (FLEXERIL) 5 MG tablet Take 1 tablet by mouth 3 (three) times daily as needed.   D3-50 1.25 MG (50000 UT) capsule Take 50,000 Units by mouth every Monday.   dicyclomine (BENTYL) 20 MG tablet Take 1 tablet (20 mg total) by mouth 2 (two) times daily. (Patient taking differently: Take 20 mg by mouth 2 (two) times daily as needed for spasms.)   docusate sodium (COLACE) 100 MG capsule Take 200 mg by mouth 2 (two) times daily.   ELDERBERRY PO Take 150 mg by mouth daily.   estradiol (CLIMARA - DOSED IN MG/24 HR) 0.05 mg/24hr patch Place 0.05 mg onto the skin once a week.   ferrous sulfate 325 (65 FE) MG tablet Take 325 mg by mouth daily with breakfast.   fluticasone (FLONASE) 50 MCG/ACT nasal spray Place 1 spray into both nostrils daily as needed for allergies or rhinitis.   furosemide (LASIX) 40 MG tablet Take 1 tablet (40 mg total) by mouth daily. (Patient taking differently: Take 40 mg by mouth as needed.)   gemfibrozil (LOPID) 600 MG tablet Take 300-600 mg by mouth See admin instructions. 600 mg in the morning and 300 mg in the evening   glimepiride (AMARYL) 4 MG tablet Take 4 mg by mouth daily.   HYDROcodone-acetaminophen (NORCO/VICODIN) 5-325 MG tablet Take 1 tablet by mouth every 6 (six) hours as needed for moderate pain or severe  pain.   Insulin Aspart Prot & Aspart  (NOVOLOG MIX 70/30 FLEXPEN [place]) Inject 45 Units into the skin daily with supper.   INVOKANA 300 MG TABS tablet Take 300 mg by mouth every morning.   levothyroxine (SYNTHROID) 150 MCG tablet Take 150 mcg by mouth daily.    lurasidone (LATUDA) 20 MG TABS tablet Take 20 mg by mouth at bedtime.   magnesium oxide (MAG-OX) 400 (240 Mg) MG tablet Take 1 tablet by mouth 2 (two) times daily.   meclizine (ANTIVERT) 25 MG tablet Take 25 mg by mouth 3 (three) times daily as needed for dizziness.   Melatonin 5 MG TABS Take 10 mg by mouth at bedtime. One 5 mg pill and one 5 mg tab   metoCLOPramide (REGLAN) 10 MG tablet Take 1 tablet (10 mg total) by mouth every 6 (six) hours as needed for nausea or vomiting.   Multiple Vitamin (MULTIVITAMIN WITH MINERALS) TABS Take 1 tablet by mouth daily.   NOVOLOG FLEXPEN 100 UNIT/ML FlexPen    pantoprazole (PROTONIX) 40 MG tablet Take 40 mg by mouth daily.   polyethylene glycol (MIRALAX / GLYCOLAX) 17 g packet Take 17 g by mouth daily.   pregabalin (LYRICA) 150 MG capsule Take 1 capsule (150 mg total) by mouth daily.   promethazine (PHENERGAN) 25 MG tablet Take 25 mg by mouth every 8 (eight) hours as needed for nausea or vomiting.   sertraline (ZOLOFT) 50 MG tablet Take 50 mg by mouth daily.   sodium bicarbonate 650 MG tablet Take 1 tablet (650 mg total) by mouth 3 (three) times daily.   vitamin C (ASCORBIC ACID) 500 MG tablet Take 500 mg by mouth daily.   Radiology:   No results found.  Cardiac Studies:  PCV ECHOCARDIOGRAM COMPLETE 02/19/2021 Left ventricle cavity is normal in size. Mild concentric hypertrophy of the left ventricle. Normal global wall motion. Normal LV systolic function with EF 62%. Indeterminate diastolic filling pattern due to E/A fusion. Structurally normal trileaflet aortic valve. No evidence of aortic stenosis. Mild (Grade I) aortic regurgitation. Mild (Grade I) mitral regurgitation. Mild tricuspid regurgitation. Mild pulmonic  regurgitation. No evidence of pulmonary hypertension.   Echocardiogram 09/30/2016: Normal LV size, normal LV systolic function, EF 01-77% without regional wall motion abnormality.  Stress test- 10/13/12 Negative for ischemia, there was no significant ST.   depression c.f. resting EKG. Walked for 7 minutes.   Peak HR- 174/min ( 93% MPHR), achieved 8.2 METS.  EKG  02/13/2021: Sinus rhythm at a rate of 92 bpm.  Left atrial enlargement.  Normal axis.  Poor R wave progression, cannot exclude anteroseptal infarct old.  No evidence of ischemia or underlying injury pattern.  07/10/2020: Sinus tachycardia at rate of 101 bpm, left atrial abnormality, normal axis, incomplete right bundle branch block, poor R wave progression, probably normal variant.  No evidence of ischemia, normal QT interval. No significant change from prior EKG.  09/17/2019: Sinus tachycardia at the rate of 105 bpm, normal axis, poor R wave progression, probably normal variant.  No evidence of ischemia.  No significant change from prior EKG.  Assessment     ICD-10-CM   1. Primary hypertension  I10     2. DOE (dyspnea on exertion)  R06.09     3. Stage 3 chronic kidney disease, unspecified whether stage 3a or 3b CKD (HCC)  N18.30        There are no discontinued medications.   No orders of the defined types were placed in this encounter.  No orders of the defined types were placed in this encounter.   Recommendations:   Brandi Love is a 46 y.o. eating disorder,  depression with psychosis, borderline personality disorder, OCD, hypothyroidism, deafness with cochlear implants, and chronic kidney disease stage III, and is attributed to NSAID use and also patient once try to commit suicide by consuming excessive amounts of aspirin in the remote past, DM.  She is being followed at [facility] for familial hypertriglyceridemia.   Patient was last seen in our office 07/10/2020 with shortness of breath which time she was advised  to follow-up in the emergency department given concerns for pulmonary embolism.  Pulmonary perfusion study was negative for PE at that time, however she was admitted to the hospital for AKI.  She has had multiple ED evaluations since then for various concerns including AKI and flank pain.  She has had multiple hospitalizations in the past for eating disorder and depression.   Patient presents for 8-week follow-up of hypertension and results of cardiac testing.  Last office visit patient's primary concern was dyspnea on exertion, therefore ordered Echocardiogram which revealed normal LVEF and mild valvular disease as well as BNP which was only minimally elevated.  Also at last office visit given uncontrolled hypertension reinitiated amlodipine 10 mg p.o. daily.  Since last office visit patient has been evaluated by vascular surgery for fistula creation, plans to undergo surgery on 05/04/2021.  Echocardiogram air and BNP were reassuring.  Suspect patient's dyspnea is multifactorial including obesity, deconditioning, as well as recovery from COVID-19.  Patient's blood pressure is now well controlled with addition of amlodipine.  Will defer management of diuretics to nephrology.  Patient is stable from a cardiovascular standpoint.  Follow-up in 6 months, sooner if needed.   Alethia Berthold, PA-C 04/25/2021, 1:07 PM Office: [phone]

## 2021-04-25 ENCOUNTER — Ambulatory Visit: Payer: Medicare Other | Admitting: Student

## 2021-04-25 ENCOUNTER — Other Ambulatory Visit: Payer: Self-pay

## 2021-04-25 ENCOUNTER — Encounter: Payer: Self-pay | Admitting: Student

## 2021-04-25 VITALS — BP 133/81 | HR 93 | Temp 98.2°F | Ht 68.0 in | Wt 194.0 lb

## 2021-04-25 DIAGNOSIS — I10 Essential (primary) hypertension: Secondary | ICD-10-CM

## 2021-04-25 DIAGNOSIS — N183 Chronic kidney disease, stage 3 unspecified: Secondary | ICD-10-CM

## 2021-04-25 DIAGNOSIS — R0609 Other forms of dyspnea: Secondary | ICD-10-CM

## 2021-05-03 ENCOUNTER — Encounter (HOSPITAL_COMMUNITY): Payer: Self-pay | Admitting: Vascular Surgery

## 2021-05-03 ENCOUNTER — Other Ambulatory Visit: Payer: Self-pay

## 2021-05-03 NOTE — Progress Notes (Addendum)
I spoke to Brandi Love, using the speech language interpreter connected to her phone, ID number - [phone].  Brandi Love denies chest pain, she does get short of breath frequently, patient reported that she has been having this for over a year. Brandi Love's Dr. Are aware. PCP is Dr Bernerd Limbo, Cardiologist is Dr. Einar Gip; patient sees Bing Matter, PA at the cardiology office.  Brandi Love  denies having any s/s of Covid in her household.  Patient denies any known exposure to Covid. Brandi Love did a home Covid test yesterday, it was negative.  Brandi Love reports having a seizure after hysterectomy in 2003. Patient reports that it went on for a while, I was given Narcan and felt better, Brandi Love reported.   Brandi Love has type II diabetes, patient does not ever check CBG, last A1C was 7.9 in November of 2022 at [facility] office.  Brandi Love had taken : I instructed patient to not take any medications on the morning of OR. I also instructed patient to take 70% of scheduled Novolog 70/30- 31 units this evening and no Insulin in am.  Brandi Love does not want to take any medications until after surgery, except Phenergan; I asked patient if she would also take Coreg and levothyroxin, patient said she would.  I instructed Brandi Love to shower with antibiotic soap, if it is available.  Dry off with a clean towel. Do not put lotion, powder, cologne or deodorant or makeup.No jewelry or piercings. Men may shave their face and neck. Woman should not shave. No nail polish, artificial or acrylic nails. Wear clean clothes, brush your teeth. Glasses, contact lens,dentures or partials may not be worn in the OR. If you need to wear them, please bring a case for glasses, do not wear contacts or bring a case, the hospital does not have contact cases, dentures or partials will have to be removed , make sure they are clean, we will provide a denture cup to put them in. You will need some one to drive you home and a responsible person over the age of 5 to  stay with you for the first 24 hours after surgery. Brandi Love, patient's mother will stay with Brandi Love. l Brandi Love called me back, patient had originally said that she is just taking Phenergan and Percocet Friday am.   Brandi Love now is asking if she may take Flexeril, Gabapentin and Lyrica, patient takes these medication on a regular bases.  I instructed patient that yes, she may take those medications, I just need patient to not be sleepy when she arrives. Brandi Love stated that she does get a little sleepy, and being deaf she might not know that you are calling me. I told Brandi Love that she may take all , if she can do so and not be sedated before she comes in. I told Brandi Love that I would suggest that she hold Flexeril. Patient said that her legs will be having spasms. I told patient if she can take Flexeril without becoming too sleepy to go ahead and take the flexeril.  I do not know what changed for patient since I spoke with her earlier, she does take the medications on a regular basis, so she may take them.

## 2021-05-04 ENCOUNTER — Telehealth: Payer: Self-pay | Admitting: *Deleted

## 2021-05-04 ENCOUNTER — Ambulatory Visit (HOSPITAL_COMMUNITY)
Admission: RE | Admit: 2021-05-04 | Discharge: 2021-05-04 | Disposition: A | Discharge status: Home / Self Care | Payer: Medicare Other | Attending: Vascular Surgery | Admitting: Vascular Surgery

## 2021-05-04 ENCOUNTER — Encounter (HOSPITAL_COMMUNITY)
Admission: RE | Disposition: A | Discharge status: Home / Self Care | Payer: Self-pay | Source: Home / Self Care | Attending: Vascular Surgery

## 2021-05-04 ENCOUNTER — Ambulatory Visit (HOSPITAL_COMMUNITY): Payer: Medicare Other | Admitting: Certified Registered Nurse Anesthetist

## 2021-05-04 DIAGNOSIS — F32A Depression, unspecified: Secondary | ICD-10-CM | POA: Diagnosis not present

## 2021-05-04 DIAGNOSIS — N183 Chronic kidney disease, stage 3 unspecified: Secondary | ICD-10-CM | POA: Diagnosis present

## 2021-05-04 DIAGNOSIS — R519 Headache, unspecified: Secondary | ICD-10-CM | POA: Diagnosis not present

## 2021-05-04 DIAGNOSIS — F419 Anxiety disorder, unspecified: Secondary | ICD-10-CM | POA: Diagnosis not present

## 2021-05-04 DIAGNOSIS — E039 Hypothyroidism, unspecified: Secondary | ICD-10-CM | POA: Insufficient documentation

## 2021-05-04 DIAGNOSIS — K219 Gastro-esophageal reflux disease without esophagitis: Secondary | ICD-10-CM | POA: Diagnosis not present

## 2021-05-04 DIAGNOSIS — N186 End stage renal disease: Secondary | ICD-10-CM | POA: Diagnosis not present

## 2021-05-04 DIAGNOSIS — E1122 Type 2 diabetes mellitus with diabetic chronic kidney disease: Secondary | ICD-10-CM | POA: Diagnosis not present

## 2021-05-04 DIAGNOSIS — R569 Unspecified convulsions: Secondary | ICD-10-CM | POA: Insufficient documentation

## 2021-05-04 DIAGNOSIS — Z992 Dependence on renal dialysis: Secondary | ICD-10-CM | POA: Diagnosis not present

## 2021-05-04 DIAGNOSIS — I129 Hypertensive chronic kidney disease with stage 1 through stage 4 chronic kidney disease, or unspecified chronic kidney disease: Secondary | ICD-10-CM | POA: Diagnosis not present

## 2021-05-04 DIAGNOSIS — M1990 Unspecified osteoarthritis, unspecified site: Secondary | ICD-10-CM | POA: Diagnosis not present

## 2021-05-04 HISTORY — DX: Family history of other specified conditions: Z84.89

## 2021-05-04 HISTORY — DX: Claustrophobia: F40.240

## 2021-05-04 HISTORY — DX: Borderline personality disorder: F60.3

## 2021-05-04 HISTORY — DX: Attention-deficit hyperactivity disorder, unspecified type: F90.9

## 2021-05-04 HISTORY — DX: Unspecified psychosis not due to a substance or known physiological condition: F29

## 2021-05-04 HISTORY — PX: AV FISTULA PLACEMENT: SHX1204

## 2021-05-04 LAB — GLUCOSE, CAPILLARY
Glucose-Capillary: 165 mg/dL — ABNORMAL HIGH (ref 70–99)
Glucose-Capillary: 149 mg/dL — ABNORMAL HIGH (ref 70–99)

## 2021-05-04 LAB — POCT I-STAT, CHEM 8
BUN: 100 mg/dL — ABNORMAL HIGH (ref 6–20)
Calcium, Ion: 1.21 mmol/L (ref 1.15–1.40)
Chloride: 103 mmol/L (ref 98–111)
Creatinine, Ser: 5.7 mg/dL — ABNORMAL HIGH (ref 0.44–1.00)
Glucose, Bld: 159 mg/dL — ABNORMAL HIGH (ref 70–99)
HCT: 31 % — ABNORMAL LOW (ref 36.0–46.0)
Hemoglobin: 10.5 g/dL — ABNORMAL LOW (ref 12.0–15.0)
Potassium: 4.4 mmol/L (ref 3.5–5.1)
Sodium: 135 mmol/L (ref 135–145)
TCO2: 22 mmol/L (ref 22–32)

## 2021-05-04 LAB — SURGICAL PCR SCREEN
MRSA, PCR: POSITIVE — AB
Staphylococcus aureus: POSITIVE — AB

## 2021-05-04 SURGERY — ARTERIOVENOUS (AV) FISTULA CREATION
Anesthesia: General | Laterality: Right

## 2021-05-04 MED ORDER — CHLORHEXIDINE GLUCONATE 4 % EX LIQD: 60.0000 mL | Freq: Once | CUTANEOUS | Status: DC

## 2021-05-04 MED ORDER — FENTANYL CITRATE (PF) 100 MCG/2ML IJ SOLN: 25.0000 ug | INTRAMUSCULAR | Status: DC | PRN

## 2021-05-04 MED ORDER — SODIUM CHLORIDE 0.9 % IV SOLN: INTRAVENOUS | Status: DC

## 2021-05-04 MED ORDER — ONDANSETRON HCL 4 MG/2ML IJ SOLN
INTRAMUSCULAR | Status: DC | PRN
  Administered 2021-05-04: 8 mg via INTRAVENOUS

## 2021-05-04 MED ORDER — DROPERIDOL 2.5 MG/ML IJ SOLN
INTRAMUSCULAR | Status: AC
  Filled 2021-05-04: qty 2

## 2021-05-04 MED ORDER — DEXAMETHASONE SODIUM PHOSPHATE 10 MG/ML IJ SOLN
INTRAMUSCULAR | Status: DC | PRN
Start: 2021-05-04 — End: 2021-05-04
  Administered 2021-05-04: 10 mg via INTRAVENOUS

## 2021-05-04 MED ORDER — PROPOFOL 500 MG/50ML IV EMUL
INTRAVENOUS | Status: DC | PRN
  Administered 2021-05-04: 25 ug/kg/min via INTRAVENOUS

## 2021-05-04 MED ORDER — FENTANYL CITRATE (PF) 250 MCG/5ML IJ SOLN
INTRAMUSCULAR | Status: AC
  Filled 2021-05-04: qty 5

## 2021-05-04 MED ORDER — FENTANYL CITRATE (PF) 250 MCG/5ML IJ SOLN
INTRAMUSCULAR | Status: DC | PRN
  Administered 2021-05-04 (×2): 50 ug via INTRAVENOUS

## 2021-05-04 MED ORDER — HEPARIN 6000 UNIT IRRIGATION SOLUTION
Status: DC | PRN
  Administered 2021-05-04: 1

## 2021-05-04 MED ORDER — CEFAZOLIN SODIUM-DEXTROSE 2-4 GM/100ML-% IV SOLN
2.0000 g | INTRAVENOUS | Status: AC
  Administered 2021-05-04: 2 g via INTRAVENOUS
  Filled 2021-05-04: qty 100

## 2021-05-04 MED ORDER — MIDAZOLAM HCL 2 MG/2ML IJ SOLN
INTRAMUSCULAR | Status: AC
  Filled 2021-05-04: qty 2

## 2021-05-04 MED ORDER — PROMETHAZINE HCL 25 MG/ML IJ SOLN
INTRAMUSCULAR | Status: AC
  Filled 2021-05-04: qty 1

## 2021-05-04 MED ORDER — OXYCODONE HCL 5 MG/5ML PO SOLN: 5.0000 mg | Freq: Once | ORAL | Status: DC | PRN

## 2021-05-04 MED ORDER — ORAL CARE MOUTH RINSE: 15.0000 mL | Freq: Once | OROMUCOSAL | Status: AC

## 2021-05-04 MED ORDER — PROMETHAZINE HCL 25 MG/ML IJ SOLN
6.2500 mg | INTRAMUSCULAR | Status: DC | PRN
  Administered 2021-05-04: 6.25 mg via INTRAVENOUS

## 2021-05-04 MED ORDER — LIDOCAINE 2% (20 MG/ML) 5 ML SYRINGE
INTRAMUSCULAR | Status: DC | PRN
  Administered 2021-05-04: 60 mg via INTRAVENOUS

## 2021-05-04 MED ORDER — MIDAZOLAM HCL 2 MG/2ML IJ SOLN
INTRAMUSCULAR | Status: DC | PRN
  Administered 2021-05-04: 2 mg via INTRAVENOUS

## 2021-05-04 MED ORDER — OXYCODONE HCL 5 MG PO TABS: 5.0000 mg | ORAL_TABLET | Freq: Once | ORAL | Status: DC | PRN

## 2021-05-04 MED ORDER — LIDOCAINE-EPINEPHRINE (PF) 1 %-1:200000 IJ SOLN
INTRAMUSCULAR | Status: AC
  Filled 2021-05-04: qty 30

## 2021-05-04 MED ORDER — PROPOFOL 10 MG/ML IV BOLUS
INTRAVENOUS | Status: DC | PRN
  Administered 2021-05-04: 200 mg via INTRAVENOUS
  Administered 2021-05-04: 40 mg via INTRAVENOUS

## 2021-05-04 MED ORDER — CHLORHEXIDINE GLUCONATE 0.12 % MT SOLN
15.0000 mL | Freq: Once | OROMUCOSAL | Status: AC
  Administered 2021-05-04: 15 mL via OROMUCOSAL
  Filled 2021-05-04: qty 15

## 2021-05-04 MED ORDER — 0.9 % SODIUM CHLORIDE (POUR BTL) OPTIME
TOPICAL | Status: DC | PRN
  Administered 2021-05-04: 1000 mL

## 2021-05-04 MED ORDER — HEPARIN 6000 UNIT IRRIGATION SOLUTION
Status: AC
  Filled 2021-05-04: qty 500

## 2021-05-04 MED ORDER — DROPERIDOL 2.5 MG/ML IJ SOLN
INTRAMUSCULAR | Status: DC | PRN
  Administered 2021-05-04: .625 mg via INTRAVENOUS

## 2021-05-04 MED ORDER — DROPERIDOL 2.5 MG/ML IJ SOLN: INTRAMUSCULAR | Status: DC | PRN

## 2021-05-04 SURGICAL SUPPLY — 33 items
ADH SKN CLS APL DERMABOND .7 (GAUZE/BANDAGES/DRESSINGS) ×1
ARMBAND PINK RESTRICT EXTREMIT (MISCELLANEOUS) ×3 IMPLANT
BAG COUNTER SPONGE SURGICOUNT (BAG) ×3 IMPLANT
BAG SPNG CNTER NS LX DISP (BAG) ×1
CANISTER SUCT 3000ML PPV (MISCELLANEOUS) ×3 IMPLANT
CLIP LIGATING EXTRA MED SLVR (CLIP) ×3 IMPLANT
CLIP LIGATING EXTRA SM BLUE (MISCELLANEOUS) ×3 IMPLANT
COVER PROBE W GEL 5X96 (DRAPES) ×1 IMPLANT
DERMABOND ADVANCED (GAUZE/BANDAGES/DRESSINGS) ×1
DERMABOND ADVANCED .7 DNX12 (GAUZE/BANDAGES/DRESSINGS) ×2 IMPLANT
ELECT REM PT RETURN 9FT ADLT (ELECTROSURGICAL) ×2
ELECTRODE REM PT RTRN 9FT ADLT (ELECTROSURGICAL) ×2 IMPLANT
GAUZE 4X4 16PLY ~~LOC~~+RFID DBL (SPONGE) ×1 IMPLANT
GLOVE SURG ENC MOIS LTX SZ7.5 (GLOVE) ×3 IMPLANT
GLOVE SURG MICRO LTX SZ6 (GLOVE) ×1 IMPLANT
GOWN STRL REUS W/ TWL LRG LVL3 (GOWN DISPOSABLE) ×4 IMPLANT
GOWN STRL REUS W/ TWL XL LVL3 (GOWN DISPOSABLE) ×2 IMPLANT
GOWN STRL REUS W/TWL LRG LVL3 (GOWN DISPOSABLE) ×4
GOWN STRL REUS W/TWL XL LVL3 (GOWN DISPOSABLE) ×2
INSERT FOGARTY SM (MISCELLANEOUS) IMPLANT
KIT BASIN OR (CUSTOM PROCEDURE TRAY) ×3 IMPLANT
KIT TURNOVER KIT B (KITS) ×3 IMPLANT
NS IRRIG 1000ML POUR BTL (IV SOLUTION) ×3 IMPLANT
PACK CV ACCESS (CUSTOM PROCEDURE TRAY) ×3 IMPLANT
PAD ARMBOARD 7.5X6 YLW CONV (MISCELLANEOUS) ×6 IMPLANT
SPONGE T-LAP 18X18 ~~LOC~~+RFID (SPONGE) ×1 IMPLANT
SUT MNCRL AB 4-0 PS2 18 (SUTURE) ×3 IMPLANT
SUT PROLENE 6 0 BV (SUTURE) ×3 IMPLANT
SUT VIC AB 3-0 SH 27 (SUTURE) ×2
SUT VIC AB 3-0 SH 27X BRD (SUTURE) ×2 IMPLANT
TOWEL GREEN STERILE (TOWEL DISPOSABLE) ×3 IMPLANT
UNDERPAD 30X36 HEAVY ABSORB (UNDERPADS AND DIAPERS) ×3 IMPLANT
WATER STERILE IRR 1000ML POUR (IV SOLUTION) ×3 IMPLANT

## 2021-05-04 NOTE — Progress Notes (Signed)
Patient more awake at this time mother at bedside patient discharged

## 2021-05-04 NOTE — Progress Notes (Signed)
Patient is resting quietly in recliner patient continues to be sleepy falls asleep very easy  does arouse easily  Unsteady with gait when standing interpreter at bedside

## 2021-05-04 NOTE — Interval H&P Note (Signed)
History and Physical Interval Note:  05/04/2021 7:23 AM  Brandi Love See  has presented today for surgery, with the diagnosis of CKD.  The various methods of treatment have been discussed with the patient and family. After consideration of risks, benefits and other options for treatment, the patient has consented to  Procedure(s): RIGHT ARM ARTERIOVENOUS (AV) FISTULA CREATION (Right) as a surgical intervention.  The patient's history has been reviewed, patient examined, no change in status, stable for surgery.  I have reviewed the patient's chart and labs.  Questions were answered to the patient's satisfaction.     Brandi Love

## 2021-05-04 NOTE — Anesthesia Procedure Notes (Signed)
Procedure Name: LMA Insertion Date/Time: 05/04/2021 7:45 AM Performed by: Minerva Ends, CRNA Pre-anesthesia Checklist: Patient identified, Emergency Drugs available, Suction available and Patient being monitored Patient Re-evaluated:Patient Re-evaluated prior to induction Oxygen Delivery Method: Circle system utilized Preoxygenation: Pre-oxygenation with 100% oxygen Induction Type: IV induction LMA: LMA inserted LMA Size: 3.0 Tube type: Oral Number of attempts: 1 Placement Confirmation: positive ETCO2 and breath sounds checked- equal and bilateral Tube secured with: Tape Dental Injury: Teeth and Oropharynx as per pre-operative assessment

## 2021-05-04 NOTE — Op Note (Signed)
° ° °  Patient name: Brandi Love MRN: [id number] DOB: 01-Jun-1975 Sex: female  05/04/2021 Pre-operative Diagnosis: ckd 3 Post-operative diagnosis:  Same Surgeon:  Erlene Quan C. Donzetta Matters, MD Assistant: Leontine Locket, PA Procedure Performed:  Right arm brachial artery to cephalic av fistula creation  Indications: 46 year old female with chronic kidney disease.  She is indicated for permanent access.  She appears to have suitable veins on the right upper extremity for fistula creation and we have discussed the risk benefits alternatives and she demonstrates good understanding and agrees to proceed.  An assistant was necessary to facilitate exposure and expedite the case.  Findings: The basilic and cephalic veins were attached via median cubital branch and the antecubitum.  The basilic vein was tied off but should be amenable to fistula creation above the antecubitum if ever needed.  The brachial artery measured 3 mm external diameter was free of disease.  At completion there was a strong thrill in the fistula that could be traced with Doppler and a palpable radial artery pulse the wrist also confirmed with Doppler.   Procedure:  The patient was identified in the holding area and taken to the operating room where she is placed supine operative table and LMA anesthesia induced.  She was sterilely prepped and draped in the right upper extremity usual fashion, antibiotics were administered and a timeout was called.  Ultrasound was used to identify her cephalic vein which was suitable for fistula creation.  This appeared to join the basilic vein in the antecubital.  A transverse incision was created.  We dissected out both the cephalic and basilic.  I elected to divide the basilic vein at the level of the cephalic this was done between ties.  We dissected through the deep fascia the brachial artery vessel loop was placed around this.  The vein was marked for orientation clamped distally and divided.  We flushed with  heparinized saline.  It was spatulated.  The arteries clamped distally proximally opened longitudinally flushed distally with heparinized saline.  The vein was sewn end-to-side with 6-0 Prolene suture.  Prior to completion the usual flushing maneuvers were performed.  Upon completion there was initially pulsatility in the fistula proximally we freed up some of the soft tissue and there was a strong thrill.  This was confirmed with Doppler.  There was a palpable radial artery pulse at the wrist also confirmed with Doppler.  Satisfied with this we irrigated the wound and obtain hemostasis.  The incision was closed in layers with Vicryl and Monocryl.  Patient was awakened from anesthesia having tolerated procedure well without immediate complication.  All counts were correct to completion.   EBL: 20cc   Nabilah Davoli C. Donzetta Matters, MD [facility] [place] Office: [phone] Pager: [phone]

## 2021-05-04 NOTE — Anesthesia Preprocedure Evaluation (Signed)
Anesthesia Evaluation  Patient identified by MRN, date of birth, ID band Patient awake    Reviewed: Allergy & Precautions, H&P , Patient's Chart, lab work & pertinent test results, reviewed documented beta blocker date and time   History of Anesthesia Complications (+) PONV  Airway Mallampati: II  TM Distance: >3 FB Neck ROM: full    Dental no notable dental hx.    Pulmonary    Pulmonary exam normal breath sounds clear to auscultation       Cardiovascular hypertension, Pt. on medications  Rhythm:regular Rate:Normal     Neuro/Psych  Headaches, Seizures -,  Anxiety Depression    GI/Hepatic GERD  ,  Endo/Other  diabetes, Type 2Hypothyroidism   Renal/GU CRFRenal disease     Musculoskeletal  (+) Arthritis , Osteoarthritis,    Abdominal   Peds  Hematology   Anesthesia Other Findings   Reproductive/Obstetrics                             Anesthesia Physical  Anesthesia Plan  ASA: 3  Anesthesia Plan: General   Post-op Pain Management:    Induction: Intravenous  PONV Risk Score and Plan: 4 or greater and Ondansetron, Dexamethasone, Midazolam, Droperidol and Treatment may vary due to age or medical condition  Airway Management Planned: LMA  Additional Equipment:   Intra-op Plan:   Post-operative Plan: Extubation in OR  Informed Consent: I have reviewed the patients History and Physical, chart, labs and discussed the procedure including the risks, benefits and alternatives for the proposed anesthesia with the patient or authorized representative who has indicated his/her understanding and acceptance.     Dental Advisory Given  Plan Discussed with: CRNA and Surgeon  Anesthesia Plan Comments:         Anesthesia Quick Evaluation

## 2021-05-04 NOTE — Discharge Instructions (Signed)
° °  [facility]  Discharge Instructions  AV Fistula or Graft Surgery for Dialysis Access  Please refer to the following instructions for your post-procedure care. Your surgeon or physician assistant will discuss any changes with you.  Activity  You may drive the day following your surgery, if you are comfortable and no longer taking prescription pain medication. Resume full activity as the soreness in your incision resolves.  Bathing/Showering  You may shower after you go home. Keep your incision dry for 48 hours. Do not soak in a bathtub, hot tub, or swim until the incision heals completely. You may not shower if you have a hemodialysis catheter.  Incision Care  Clean your incision with mild soap and water after 48 hours. Pat the area dry with a clean towel. You do not need a bandage unless otherwise instructed. Do not apply any ointments or creams to your incision. You may have skin glue on your incision. Do not peel it off. It will come off on its own in about one week. Your arm may swell a bit after surgery. To reduce swelling use pillows to elevate your arm so it is above your heart. Your doctor will tell you if you need to lightly wrap your arm with an ACE bandage.  Diet  Resume your normal diet. There are not special food restrictions following this procedure. In order to heal from your surgery, it is CRITICAL to get adequate nutrition. Your body requires vitamins, minerals, and protein. Vegetables are the best source of vitamins and minerals. Vegetables also provide the perfect balance of protein. Processed food has little nutritional value, so try to avoid this.  Medications  Resume taking all of your medications. If your incision is causing pain, you may take over-the counter pain relievers such as acetaminophen (Tylenol). If you were prescribed a stronger pain medication, please be aware these medications can cause nausea and constipation. Prevent  nausea by taking the medication with a snack or meal. Avoid constipation by drinking plenty of fluids and eating foods with high amount of fiber, such as fruits, vegetables, and grains.  Do not take Tylenol if you are taking prescription pain medications.  Follow up Your surgeon may want to see you in the office following your access surgery. If so, this will be arranged at the time of your surgery.  Please call us immediately for any of the following conditions:  Increased pain, redness, drainage (pus) from your incision site Fever of 101 degrees or higher Severe or worsening pain at your incision site Hand pain or numbness.  Reduce your risk of vascular disease:  Stop smoking. If you would like help, call [facility] at 1-800-QUIT-NOW 930-741-7543) or [place] at [place] your cholesterol Maintain a desired weight Control your diabetes Keep your blood pressure down  Dialysis  It will take several weeks to several months for your new dialysis access to be ready for use. Your surgeon will determine when it is okay to use it. Your nephrologist will continue to direct your dialysis. You can continue to use your Permcath until your new access is ready for use.   05/04/2021 Brandi Love [id number] 1975-09-04  Surgeon(s): Waynetta Sandy, MD  Procedure(s): RIGHT ARM ARTERIOVENOUS (AV) FISTULA CREATION  x Do not stick fistula for 12 weeks    If you have any questions, please call the office at [phone].

## 2021-05-04 NOTE — Transfer of Care (Signed)
Immediate Anesthesia Transfer of Care Note  Patient: Brandi Love  Procedure(s) Performed: RIGHT ARM BRACHIOCEPHALIC  ARTERIOVENOUS FISTULA CREATION (Right)  Patient Location: PACU  Anesthesia Type:General  Level of Consciousness: awake and alert   Airway & Oxygen Therapy: Patient Spontanous Breathing and Patient connected to nasal cannula oxygen  Post-op Assessment: Report given to RN and Post -op Vital signs reviewed and stable  Post vital signs: Reviewed and stable  Last Vitals:  Vitals Value Taken Time  BP 126/91 05/04/21 0852  Temp    Pulse 100 05/04/21 0853  Resp 33 05/04/21 0853  SpO2 94 % 05/04/21 0853  Vitals shown include unvalidated device data.  Last Pain:  Vitals:   05/04/21 0629  TempSrc: Oral  PainSc: 8       Patients Stated Pain Goal: 3 (15/52/08 0223)  Complications: No notable events documented.

## 2021-05-04 NOTE — Telephone Encounter (Signed)
Patient called and wanted discharge instructions reviewed with her through an interpreter service. Reviewed discharge instuctions given to her after surgery this am. Patient verbalized understanding. She will contact us if she has any problems.

## 2021-05-06 ENCOUNTER — Encounter (HOSPITAL_COMMUNITY): Payer: Self-pay | Admitting: Vascular Surgery

## 2021-05-07 NOTE — Anesthesia Postprocedure Evaluation (Signed)
Anesthesia Post Note  Patient: Brandi Love  Procedure(s) Performed: RIGHT ARM BRACHIOCEPHALIC  ARTERIOVENOUS FISTULA CREATION (Right)     Patient location during evaluation: PACU Anesthesia Type: General Level of consciousness: awake and alert Pain management: pain level controlled Vital Signs Assessment: post-procedure vital signs reviewed and stable Respiratory status: spontaneous breathing, nonlabored ventilation and respiratory function stable Cardiovascular status: blood pressure returned to baseline and stable Postop Assessment: no apparent nausea or vomiting Anesthetic complications: no   No notable events documented.  Last Vitals:  Vitals:   05/04/21 1130 05/04/21 1145  BP:    Pulse: 93 96  Resp:    Temp:    SpO2: 93% 94%    Last Pain:  Vitals:   05/04/21 0951  TempSrc:   PainSc: 0-No pain                 Lynda Rainwater

## 2021-06-01 ENCOUNTER — Encounter: Payer: Self-pay | Admitting: Vascular Surgery

## 2021-06-06 ENCOUNTER — Ambulatory Visit (INDEPENDENT_AMBULATORY_CARE_PROVIDER_SITE_OTHER): Payer: Medicare Other | Admitting: Vascular Surgery

## 2021-06-06 ENCOUNTER — Other Ambulatory Visit: Payer: Self-pay

## 2021-06-06 ENCOUNTER — Encounter: Payer: Self-pay | Admitting: Vascular Surgery

## 2021-06-06 VITALS — BP 130/82 | HR 100 | Temp 98.2°F | Resp 20 | Ht 68.0 in | Wt 194.0 lb

## 2021-06-06 DIAGNOSIS — N183 Chronic kidney disease, stage 3 unspecified: Secondary | ICD-10-CM

## 2021-06-06 NOTE — Progress Notes (Signed)
°  ° °  Subjective:     Patient ID: Brandi Love, female   DOB: [birth year], 46 y.o.   MRN: [id number]  HPI 46 year old incredibly pleasant female with recent history of right brachial artery to cephalic vein fistula.  She states that she has had some swelling in her right upper extremity and burning type pain in the forearm that radiates to her wrist.  She also has some swelling of the hand she has mostly been using her left hand to carry her backpack and other items.  She still has sensation and motor function in the left hand.  All history obtained via American sign language interpreter   Review of Systems Right arm burning and swelling    Objective:   Physical Exam Vitals:   06/06/21 1033  BP: 130/82  Pulse: 100  Resp: 20  Temp: 98.2 F (36.8 C)  SpO2: 93%  Awake alert oriented Right upper arm fistula with strong thrill Right antecubital incision well-healed Palpable right radial pulse     Assessment/plan     46 year old female status post right brachial artery to cephalic vein AV fistula.  As she is not on dialysis and is having complications of the fistula I have offered her ligation although I do think some of her issue is secondary to nerve irritation and swelling and possibly this will resolve with time.  She has a scheduled follow-up in 2 weeks with duplex and we can discuss her options further after duplex is performed.  If she is still having issues at that time we may need to consider ligation of the fistula and she demonstrates good understanding using American sign language interpreter today.     Brandi Windt C. Donzetta Matters, MD [facility] [place] Office: [phone] Pager: [phone]

## 2021-06-16 ENCOUNTER — Other Ambulatory Visit: Payer: Self-pay

## 2021-06-16 DIAGNOSIS — N183 Chronic kidney disease, stage 3 unspecified: Secondary | ICD-10-CM

## 2021-06-20 ENCOUNTER — Ambulatory Visit (HOSPITAL_COMMUNITY)
Admission: RE | Admit: 2021-06-20 | Discharge: 2021-06-20 | Disposition: A | Discharge status: Home / Self Care | Payer: Medicare Other | Source: Ambulatory Visit | Attending: Vascular Surgery | Admitting: Vascular Surgery

## 2021-06-20 ENCOUNTER — Encounter: Payer: Self-pay | Admitting: Vascular Surgery

## 2021-06-20 ENCOUNTER — Other Ambulatory Visit: Payer: Self-pay

## 2021-06-20 ENCOUNTER — Ambulatory Visit (INDEPENDENT_AMBULATORY_CARE_PROVIDER_SITE_OTHER): Payer: Medicare Other | Admitting: Vascular Surgery

## 2021-06-20 VITALS — BP 126/78 | HR 90 | Temp 98.2°F | Resp 20 | Ht 68.0 in | Wt 196.0 lb

## 2021-06-20 DIAGNOSIS — N183 Chronic kidney disease, stage 3 unspecified: Secondary | ICD-10-CM | POA: Diagnosis present

## 2021-06-20 NOTE — Progress Notes (Signed)
?  ? ?  Subjective:  ?  ? Patient ID: Brandi Love, female   DOB: 1975-06-02, 47 y.o.   MRN: [id number] ? ?HPI 45 year old female recent underwent right brachial artery to cephalic vein fistula.  She is not on dialysis.  At last visit she was having swelling and pain of the right upper extremity.  This is improving.  She does have a breakout of eczema right now which is her chief complaint she also has asthma.  Currently is healing very well without complaint. ? ?All information obtained via American sign language interpreter ? ? ?Review of Systems ?Eczema, asthma ?   ?Objective:  ? Physical Exam ?Vitals:  ? 06/20/21 1139  ?BP: 126/78  ?Pulse: 90  ?Resp: 20  ?Temp: 98.2 ?F (36.8 ?C)  ?SpO2: 95%  ? ?Awake alert oriented ?Right upper arm AV fistula with strong thrill may be somewhat deep in the upper arm for cannulation ? ? ?AV fistula duplex ?+--------------------+----------+-----------------+--------+  ?AVF                 PSV (cm/s)Flow Vol (mL/min)Comments  ?+--------------------+----------+-----------------+--------+  ?Native artery inflow   141          1335                 ?+--------------------+----------+-----------------+--------+  ?AVF Anastomosis        816                               ?+--------------------+----------+-----------------+--------+  ? ?   ?+------------+----------+-------------+----------+--------+  ?OUTFLOW VEINPSV (cm/s)Diameter (cm)Depth (cm)Describe  ?+------------+----------+-------------+----------+--------+  ?Prox UA         62        0.49        1.32             ?+------------+----------+-------------+----------+--------+  ?Mid UA         137        0.66        0.93             ?+------------+----------+-------------+----------+--------+  ?Dist UA         85        0.72        0.66             ?+------------+----------+-------------+----------+--------+  ?AC Fossa       194        1.28        0.19              ?+------------+----------+-------------+----------+--------+  ? ?   ? ? ?Summary:  ?Patent arteriovenous fistula.  ?   ?Assessment:  ?   ?46 year old female with chronic kidney disease here for fistula follow-up.  Initially she was having pain but this is improving. ?   ?Plan:  ?   ?She can follow-up with me on an as-needed basis.  She may need superficialization of this fistula at this usable at the time of her needing dialysis. ?   ?Trell Secrist C. Donzetta Matters, MD ?[facility] ?Office: [phone] ?Pager: [phone] ? ?

## 2021-07-13 ENCOUNTER — Encounter (HOSPITAL_COMMUNITY): Payer: Medicare Other

## 2021-07-17 ENCOUNTER — Other Ambulatory Visit: Payer: Self-pay

## 2021-07-17 ENCOUNTER — Emergency Department (HOSPITAL_COMMUNITY)
Admission: EM | Admit: 2021-07-17 | Discharge: 2021-07-17 | Disposition: A | Discharge status: Home / Self Care | Payer: Medicare Other | Attending: Emergency Medicine | Admitting: Emergency Medicine

## 2021-07-17 DIAGNOSIS — R739 Hyperglycemia, unspecified: Secondary | ICD-10-CM | POA: Insufficient documentation

## 2021-07-17 DIAGNOSIS — Z7984 Long term (current) use of oral hypoglycemic drugs: Secondary | ICD-10-CM | POA: Insufficient documentation

## 2021-07-17 DIAGNOSIS — Z7982 Long term (current) use of aspirin: Secondary | ICD-10-CM | POA: Diagnosis not present

## 2021-07-17 DIAGNOSIS — N185 Chronic kidney disease, stage 5: Secondary | ICD-10-CM | POA: Diagnosis not present

## 2021-07-17 DIAGNOSIS — Z79899 Other long term (current) drug therapy: Secondary | ICD-10-CM | POA: Insufficient documentation

## 2021-07-17 DIAGNOSIS — R040 Epistaxis: Secondary | ICD-10-CM | POA: Insufficient documentation

## 2021-07-17 DIAGNOSIS — Z794 Long term (current) use of insulin: Secondary | ICD-10-CM | POA: Insufficient documentation

## 2021-07-17 LAB — CBC
HCT: 26.7 % — ABNORMAL LOW (ref 36.0–46.0)
Hemoglobin: 8.6 g/dL — ABNORMAL LOW (ref 12.0–15.0)
MCH: 30.8 pg (ref 26.0–34.0)
MCHC: 32.2 g/dL (ref 30.0–36.0)
MCV: 95.7 fL (ref 80.0–100.0)
Platelets: 230 10*3/uL (ref 150–400)
RBC: 2.79 MIL/uL — ABNORMAL LOW (ref 3.87–5.11)
RDW: 15 % (ref 11.5–15.5)
WBC: 7.8 10*3/uL (ref 4.0–10.5)
nRBC: 0 % (ref 0.0–0.2)

## 2021-07-17 LAB — BASIC METABOLIC PANEL
Anion gap: 16 — ABNORMAL HIGH (ref 5–15)
BUN: 67 mg/dL — ABNORMAL HIGH (ref 6–20)
CO2: 20 mmol/L — ABNORMAL LOW (ref 22–32)
Calcium: 9.4 mg/dL (ref 8.9–10.3)
Chloride: 100 mmol/L (ref 98–111)
Creatinine, Ser: 4.53 mg/dL — ABNORMAL HIGH (ref 0.44–1.00)
GFR, Estimated: 12 mL/min — ABNORMAL LOW (ref >60)
Glucose, Bld: 337 mg/dL — ABNORMAL HIGH (ref 70–99)
Potassium: 5.9 mmol/L — ABNORMAL HIGH (ref 3.5–5.1)
Sodium: 136 mmol/L (ref 135–145)

## 2021-07-17 LAB — POTASSIUM: Potassium: 5 mmol/L (ref 3.5–5.1)

## 2021-07-17 MED ORDER — COCAINE HCL 4 % EX SOLN
4.0000 mL | Freq: Once | CUTANEOUS | Status: AC
  Administered 2021-07-17: 4 mL via TOPICAL
  Filled 2021-07-17: qty 4

## 2021-07-17 NOTE — ED Provider Notes (Signed)
?[facility] ?Provider Note ? ? ?CSN: [id number] ?Arrival date & time: 07/17/21  1754 ? ?  ? ?History ? ?Chief Complaint  ?Patient presents with  ? Epistaxis  ? ? ?Brandi Love is a 46 y.o. female. ? ?Pt with c/o right sided nosebleeding in past two days, intermittent, mild-mod, recurrent. No hx chronic nosebleeding. No other abnormal bruising or bleeding, no blood in stool. No fainting episodes or syncope. No chest pain or sob. Denies sore throat, runny nose or other uri symptoms.  ? ?The history is provided by the EMS personnel, medical records and the patient.  ?Epistaxis ?Associated symptoms: no fever   ? ?  ? ?Home Medications ?Prior to Admission medications   ?Medication Sig Start Date End Date Taking? Authorizing Provider  ?acetaminophen (TYLENOL) 500 MG tablet Take 1,000-2,500 mg by mouth See admin instructions. Take 2 tablets (1000 mg) by mouth in the morning, and take 5 tablets (2500 mg) by mouth at bedtime    [provider]  ?albuterol (VENTOLIN HFA) 108 (90 Base) MCG/ACT inhaler Inhale 1 puff into the lungs every 6 (six) hours as needed for wheezing or shortness of breath. 07/15/20   Lavina Hamman, MD  ?allopurinol (ZYLOPRIM) 300 MG tablet Take 300 mg by mouth at bedtime. 08/12/19   [provider]  ?amLODipine (NORVASC) 5 MG tablet Take 1 tablet (5 mg total) by mouth daily. 03/09/21 07/07/21  Cantwell, Celeste C, PA-C  ?aspirin EC 81 MG tablet Take 81 mg by mouth in the morning.    [provider]  ?carvedilol (COREG) 12.5 MG tablet Take 12.5 mg by mouth in the morning and at bedtime.    [provider]  ?CINNAMON PO Take 2,000 mg by mouth in the morning and at bedtime.    [provider]  ?cyclobenzaprine (FLEXERIL) 5 MG tablet Take 5 mg by mouth in the morning, at noon, and at bedtime. 11/10/20   [provider]  ?docusate sodium (COLACE) 100 MG capsule Take 200 mg by mouth 2 (two) times daily. 07/14/18    [provider]  ?ELDERBERRY PO Take 150 mg by mouth in the morning.    [provider]  ?estradiol (CLIMARA - DOSED IN MG/24 HR) 0.05 mg/24hr patch Place 0.05 mg onto the skin every Sunday. 03/24/20   [provider]  ?ferrous sulfate 325 (65 FE) MG tablet Take 325 mg by mouth in the morning.    [provider]  ?fluticasone (FLONASE) 50 MCG/ACT nasal spray Place 1 spray into both nostrils daily as needed for allergies or rhinitis.    [provider]  ?furosemide (LASIX) 40 MG tablet Take 1 tablet (40 mg total) by mouth daily. ?Patient taking differently: Take 40 mg by mouth See admin instructions. Take 2 tablets (80 mg) by mouth in the morning & take 1 tablet (40 mg) by mouth at night. 07/26/20   Lajean Saver, MD  ?gemfibrozil (LOPID) 600 MG tablet Take 300-600 mg by mouth See admin instructions. Take 1 tablet (600 mg) by mouth in the morning and 0.5 tablet (300 mg) by mouth in the evening 09/09/17   [provider]  ?glimepiride (AMARYL) 4 MG tablet Take 4 mg by mouth in the morning. 06/15/19   [provider]  ?HYDROcodone-acetaminophen (NORCO/VICODIN) 5-325 MG tablet Take 1 tablet by mouth every 6 (six) hours as needed for moderate pain or severe pain. ?Patient taking differently: Take 1 tablet by mouth in the morning, at noon,  in the evening, and at bedtime. 07/15/20   Lavina Hamman, MD  ?Insulin Aspart Prot & Aspart (NOVOLOG MIX 70/30 FLEXPEN [place]) Inject 25-45 Units into the skin See admin instructions. Inject 25 units subcutaneously in the morning & inject 45 units subcutaneously in the evening.    [provider]  ?INVOKANA 300 MG TABS tablet Take 300 mg by mouth every morning. 06/23/20   [provider]  ?levothyroxine (SYNTHROID) 175 MCG tablet Take 175 mcg by mouth daily before breakfast.    [provider]  ?lurasidone (LATUDA) 20 MG TABS tablet Take 20 mg by mouth at bedtime.    [provider]  ?magnesium  oxide (MAG-OX) 400 (240 Mg) MG tablet Take 400 mg by mouth 2 (two) times daily. 10/01/20   [provider]  ?meclizine (ANTIVERT) 25 MG tablet Take 25 mg by mouth 3 (three) times daily as needed for dizziness. 06/03/19   [provider]  ?Melatonin 5 MG TABS Take 10 mg by mouth at bedtime.    [provider]  ?Multiple Vitamins-Minerals (ADULT ONE DAILY GUMMIES PO) Take 1 tablet by mouth in the morning. Centrum for Women 50+ (Gummy)    [provider]  ?ondansetron (ZOFRAN-ODT) 8 MG disintegrating tablet Take 8 mg by mouth every 8 (eight) hours as needed for nausea or vomiting.    [provider]  ?pantoprazole (PROTONIX) 40 MG tablet Take 40 mg by mouth in the morning.    [provider]  ?polyethylene glycol (MIRALAX / GLYCOLAX) 17 g packet Take 17 g by mouth daily. ?Patient taking differently: Take 17 g by mouth daily as needed (constipation). 07/16/20   Lavina Hamman, MD  ?pregabalin (LYRICA) 150 MG capsule Take 1 capsule (150 mg total) by mouth daily. ?Patient taking differently: Take 150 mg by mouth in the morning, at noon, and at bedtime. 07/15/20   Lavina Hamman, MD  ?promethazine (PHENERGAN) 25 MG tablet Take 25 mg by mouth every 8 (eight) hours as needed for nausea or vomiting. 06/02/19   [provider]  ?sertraline (ZOLOFT) 50 MG tablet Take 50 mg by mouth every evening. 07/09/20   [provider]  ?sodium bicarbonate 650 MG tablet Take 1 tablet (650 mg total) by mouth 3 (three) times daily. ?Patient taking differently: Take 1,300 mg by mouth in the morning and at bedtime. 07/15/20   Lavina Hamman, MD  ?triamcinolone cream (KENALOG) 0.1 % Apply 1 application topically 2 (two) times daily as needed (skin irritation).    [provider]  ?vitamin C (ASCORBIC ACID) 500 MG tablet Take 500 mg by mouth in the morning.    [provider]  ?Vitamin D, Ergocalciferol, (DRISDOL) 1.25 MG (50000 UNIT) CAPS capsule Take 50,000  Units by mouth every Monday.    [provider]  ?   ? ?Allergies    ?Fish oil, Iodinated contrast media, Other, Phenylephrine-guaifenesin, Propoxyphene, Hydromorphone hcl, Abilify [aripiprazole], Bee venom, Dhea [nutritional supplements], Dihydroergotamine, Divalproex sodium, Doxycycline, Erythromycin, Indomethacin, Risperidone and related, Shellfish allergy, Statins, Tape, Dihydroergotamine, Lactose intolerance (gi), Nsaids, and Sulfa antibiotics   ? ?Review of Systems   ?Review of Systems  ?Constitutional:  Negative for fever.  ?HENT:  Positive for nosebleeds.   ?Respiratory:  Negative for shortness of breath.   ?Gastrointestinal:  Negative for blood in stool and vomiting.  ?Neurological:  Negative for light-headedness.  ?Hematological:  Does not bruise/bleed easily.  ? ?Physical Exam ?Updated Vital Signs ?Ht 1.727 m (5\' 8" )  Wt 89 kg   LMP  (LMP Unknown)   SpO2 98%   BMI 29.83 kg/m?  ?Physical Exam ?Vitals and nursing note reviewed.  ?Constitutional:   ?   Appearance: Normal appearance. She is well-developed.  ?HENT:  ?   Head: Atraumatic.  ?   Nose:  ?   Comments: Gauze in right nares partially saturated w blood.  ?   Mouth/Throat:  ?   Mouth: Mucous membranes are moist.  ?   Pharynx: Oropharynx is clear. No oropharyngeal exudate or posterior oropharyngeal erythema.  ?   Comments: No bleeding posterior pharynx.  ?Eyes:  ?   General: No scleral icterus. ?   Conjunctiva/sclera: Conjunctivae normal.  ?Neck:  ?   Trachea: No tracheal deviation.  ?Cardiovascular:  ?   Rate and Rhythm: Normal rate and regular rhythm.  ?   Pulses: Normal pulses.  ?   Heart sounds: Normal heart sounds. No murmur heard. ?  No friction rub. No gallop.  ?Pulmonary:  ?   Effort: Pulmonary effort is normal. No respiratory distress.  ?   Breath sounds: Normal breath sounds.  ?Abdominal:  ?   General: There is no distension.  ?   Tenderness: There is no abdominal tenderness. There is no guarding.  ?Genitourinary: ?   Comments:  No cva tenderness.  ?Musculoskeletal:     ?   General: No swelling.  ?   Cervical back: Normal range of motion and neck supple. No rigidity. No muscular tenderness.  ?Skin: ?   General: Skin is warm and dry.  ?   Findi

## 2021-07-17 NOTE — ED Notes (Addendum)
Cocaine topical and silver nitrate wasted in sharps container witnessed by Baxter Hire RN  ?

## 2021-07-17 NOTE — ED Triage Notes (Signed)
Pt BIB [facility] for nose bleed x2 days. Has Afrin gauze in right nare from EMS and gauze is soaked through. Pt is deaf, reads lips. New fistula to R arm, recent kidney failure dx. Pt pale on arrival but ambulatory to stretcher.  ? ?141/91 HR94 98% on RA CBG 313 ?

## 2021-07-17 NOTE — ED Provider Notes (Signed)
.  Epistaxis Management ? ?Date/Time: 07/17/2021 9:55 PM ?Performed by: Domenic Moras, PA-C ?Authorized by: Domenic Moras, PA-C  ? ?Consent:  ?  Consent obtained:  Verbal ?  Consent given by:  Patient ?  Risks, benefits, and alternatives were discussed: yes   ?  Risks discussed:  Bleeding, nasal injury and pain ?  Alternatives discussed:  No treatment ?Universal protocol:  ?  Procedure explained and questions answered to patient or proxy's satisfaction: yes   ?  Relevant documents present and verified: yes   ?  Patient identity confirmed:  Verbally with patient and arm band ?Anesthesia:  ?  Anesthesia method:  None ?Procedure details:  ?  Treatment site:  R anterior ?  Treatment method:  Nasal tampon ?  Treatment complexity:  Limited ?  Treatment episode: recurring   ?Post-procedure details:  ?  Assessment:  Bleeding stopped ?  Procedure completion:  Tolerated well, no immediate complications ? ?  ?Domenic Moras, PA-C ?07/17/21 2156 ? ?  ?Lajean Saver, MD ?07/17/21 2259 ? ?

## 2021-07-17 NOTE — ED Notes (Addendum)
Rhino rocket placed successfully after bleeding began again in the waiting room after previous DC. Bleeding scab on leg d/t excema wrapped and secured.  DC instructions reviewed with pt. PT verbalized understanding. PT DC. ?

## 2021-07-17 NOTE — Discharge Instructions (Addendum)
It was our pleasure to provide your ER care today - we hope that you feel better. ? ?May use saline nasal spray or humidifier in your room to help keep nasal mucosa moist. Do not rub nose, put finger in nose, or blow nose for the next couple days.  ? ?If bleeding recurs, hold direct pressure/pinch nose snugly for 10 minutes without letting up to see if bleeding - if bleeding persists, return for recheck.  ? ?Follow up with ENT doctor in 2-3 days for recheck and packing removal - call office tomorrow AM to arrange appointment (if for some reason not able to be seen, return to urgent care in 2-3 days for packing removal).  ? ?For high blood sugar, make sure to drink adequate water/stay well hydrated, continue diabetes meds, follow diabetes eating plan, monitor sugars closely, and follow up with primary care doctor in the coming week.  ? ?Also follow up closely with your kidney doctor in the next 1-2 weeks.  ? ?Return to ER if worse, new symptoms, persistent heavy bleeding, weak/faint, or other concern.  ?

## 2021-07-23 ENCOUNTER — Other Ambulatory Visit (HOSPITAL_COMMUNITY): Payer: Self-pay | Admitting: *Deleted

## 2021-07-25 ENCOUNTER — Encounter (HOSPITAL_COMMUNITY)
Admission: RE | Admit: 2021-07-25 | Discharge: 2021-07-25 | Disposition: A | Discharge status: Home / Self Care | Payer: Medicare Other | Source: Ambulatory Visit | Attending: Nephrology | Admitting: Nephrology

## 2021-07-25 DIAGNOSIS — D631 Anemia in chronic kidney disease: Secondary | ICD-10-CM | POA: Insufficient documentation

## 2021-07-25 DIAGNOSIS — N189 Chronic kidney disease, unspecified: Secondary | ICD-10-CM | POA: Insufficient documentation

## 2021-07-25 MED ORDER — SODIUM CHLORIDE 0.9 % IV SOLN
510.0000 mg | INTRAVENOUS | Status: DC
  Administered 2021-07-25: 510 mg via INTRAVENOUS
  Filled 2021-07-25: qty 510

## 2021-08-01 ENCOUNTER — Encounter (HOSPITAL_COMMUNITY)
Admission: RE | Admit: 2021-08-01 | Discharge: 2021-08-01 | Disposition: A | Discharge status: Home / Self Care | Payer: Medicare Other | Source: Ambulatory Visit | Attending: Nephrology | Admitting: Nephrology

## 2021-08-01 DIAGNOSIS — N189 Chronic kidney disease, unspecified: Secondary | ICD-10-CM | POA: Diagnosis not present

## 2021-08-01 MED ORDER — SODIUM CHLORIDE 0.9 % IV SOLN
510.0000 mg | INTRAVENOUS | Status: DC
  Administered 2021-08-01: 510 mg via INTRAVENOUS
  Filled 2021-08-01: qty 17

## 2021-08-08 ENCOUNTER — Encounter (HOSPITAL_COMMUNITY): Payer: Medicare Other

## 2021-08-23 ENCOUNTER — Encounter: Payer: Self-pay | Admitting: Vascular Surgery

## 2021-09-28 ENCOUNTER — Emergency Department (HOSPITAL_COMMUNITY): Payer: Medicare Other

## 2021-09-28 ENCOUNTER — Emergency Department (HOSPITAL_COMMUNITY)
Admission: EM | Admit: 2021-09-28 | Discharge: 2021-09-29 | Disposition: A | Discharge status: Home / Self Care | Payer: Medicare Other | Attending: Emergency Medicine | Admitting: Emergency Medicine

## 2021-09-28 ENCOUNTER — Other Ambulatory Visit: Payer: Self-pay

## 2021-09-28 ENCOUNTER — Encounter (HOSPITAL_COMMUNITY): Payer: Self-pay | Admitting: Emergency Medicine

## 2021-09-28 DIAGNOSIS — N186 End stage renal disease: Secondary | ICD-10-CM | POA: Diagnosis not present

## 2021-09-28 DIAGNOSIS — R531 Weakness: Secondary | ICD-10-CM | POA: Diagnosis not present

## 2021-09-28 DIAGNOSIS — R079 Chest pain, unspecified: Secondary | ICD-10-CM

## 2021-09-28 DIAGNOSIS — R42 Dizziness and giddiness: Secondary | ICD-10-CM | POA: Diagnosis not present

## 2021-09-28 DIAGNOSIS — Z7982 Long term (current) use of aspirin: Secondary | ICD-10-CM | POA: Diagnosis not present

## 2021-09-28 DIAGNOSIS — Z992 Dependence on renal dialysis: Secondary | ICD-10-CM | POA: Insufficient documentation

## 2021-09-28 DIAGNOSIS — R0789 Other chest pain: Secondary | ICD-10-CM | POA: Diagnosis not present

## 2021-09-28 LAB — CBC
HCT: 31.7 % — ABNORMAL LOW (ref 36.0–46.0)
Hemoglobin: 11 g/dL — ABNORMAL LOW (ref 12.0–15.0)
MCH: 32.5 pg (ref 26.0–34.0)
MCHC: 34.7 g/dL (ref 30.0–36.0)
MCV: 93.8 fL (ref 80.0–100.0)
Platelets: 258 K/uL (ref 150–400)
RBC: 3.38 MIL/uL — ABNORMAL LOW (ref 3.87–5.11)
RDW: 14.5 % (ref 11.5–15.5)
WBC: 10 K/uL (ref 4.0–10.5)
nRBC: 0 % (ref 0.0–0.2)

## 2021-09-28 LAB — BASIC METABOLIC PANEL WITH GFR
Anion gap: 14 (ref 5–15)
BUN: 28 mg/dL — ABNORMAL HIGH (ref 6–20)
CO2: 28 mmol/L (ref 22–32)
Calcium: 8.4 mg/dL — ABNORMAL LOW (ref 8.9–10.3)
Chloride: 95 mmol/L — ABNORMAL LOW (ref 98–111)
Creatinine, Ser: 2.52 mg/dL — ABNORMAL HIGH (ref 0.44–1.00)
GFR, Estimated: 24 mL/min — ABNORMAL LOW (ref >60)
Glucose, Bld: 146 mg/dL — ABNORMAL HIGH (ref 70–99)
Potassium: 3.6 mmol/L (ref 3.5–5.1)
Sodium: 137 mmol/L (ref 135–145)

## 2021-09-28 LAB — TROPONIN I (HIGH SENSITIVITY)
Troponin I (High Sensitivity): 5 ng/L (ref <18)
Troponin I (High Sensitivity): 5 ng/L (ref <18)

## 2021-09-28 NOTE — ED Triage Notes (Signed)
Pt BIB [facility] with central chest pain since last night that is not radiating. EMS reports pain is non-reproducible. Is hemodialysis pt M-W-F last treatment was today and received full session.   Ems Interventions ASA 324mg   EMS Vitals BP 114/60 HR 98 RR 18 SPO2 94% RA

## 2021-09-28 NOTE — ED Provider Triage Note (Signed)
Emergency Medicine Provider Triage Evaluation Note  Brandi Love , a 46 y.o. female  was evaluated in triage.  Pt complains of chest pain.  Also with some shortness of breath.  Has a history of PE, feels somewhat similar.  Monday Wednesday Friday dialysis patient.  Went today and Wednesday but missed the previous Friday and this Monday.  Was able to finish her treatment today.  Review of Systems  Positive: Pleuritic chest pain, shortness of breath Negative: Dizziness or syncope  Physical Exam  BP 116/77 (BP Location: Left Arm)   Pulse 100   Temp 98.8 F (37.1 C) (Oral)   Resp 20   LMP  (LMP Unknown)   SpO2 95%  Gen:   Awake, no distress   Resp:  Normal effort  MSK:   Moves extremities without difficulty  Other:  No acute distress, lung sounds clear  Medical Decision Making  Medically screening exam initiated at 7:47 PM.  Appropriate orders placed.  Brandi Love was informed that the remainder of the evaluation will be completed by another provider, this initial triage assessment does not replace that evaluation, and the importance of remaining in the ED until their evaluation is complete.    Unfortunately patient has an extensive list of allergies.  Contrast media being one of them.  Will order D-dimer and if imaging for PE is necessary, VQ scan or premedication for CT will be required.   Brandi Hammock, PA-C 09/28/21 1949

## 2021-09-29 DIAGNOSIS — R0789 Other chest pain: Secondary | ICD-10-CM | POA: Diagnosis not present

## 2021-09-29 MED ORDER — PREDNISONE 10 MG PO TABS: 10.0000 mg | ORAL_TABLET | Freq: Every day | ORAL | 0 refills | Status: AC

## 2021-09-29 MED ORDER — PREDNISONE 20 MG PO TABS
10.0000 mg | ORAL_TABLET | Freq: Once | ORAL | Status: AC
  Administered 2021-09-29: 10 mg via ORAL
  Filled 2021-09-29: qty 1

## 2021-09-29 NOTE — ED Notes (Signed)
Patient is deaf.

## 2021-09-29 NOTE — ED Notes (Signed)
X2 with no response.

## 2021-09-29 NOTE — ED Provider Notes (Signed)
[facility] EMERGENCY DEPARTMENT Provider Note   CSN: [id number] Arrival date & time: 09/28/21  1924     History  Chief Complaint  Patient presents with   Chest Pain    Brandi Love is a 46 y.o. female.  46 year old female who is hard of hearing also history of end-stage renal disease on dialysis who presents the ER today with chest pain.  States she had a blood clot when she had a hysterectomy in the past and has what has her most concern.  She also has some generalized weakness and lightheadedness over the last couple days.  She missed multiple sessions of dialysis over the last week but did go Wednesday and Friday.  States normal dialysis but did not wear some oxygen at 1 point.  Talk to her primary doctor who asked her to come here for further evaluation.  No new lower extremity swelling.  No trauma.  No rashes.  She states that the pain is just to the left of her sternum hurts worse when she touches it worse when she takes a deep breath   Chest Pain      Home Medications Prior to Admission medications   Medication Sig Start Date End Date Taking? Authorizing Provider  predniSONE (DELTASONE) 10 MG tablet Take 1 tablet (10 mg total) by mouth daily with breakfast for 7 days. 09/29/21 10/06/21 Yes Donovan Persley, Corene Cornea, MD  acetaminophen (TYLENOL) 500 MG tablet Take 1,000-2,500 mg by mouth See admin instructions. Take 2 tablets (1000 mg) by mouth in the morning, and take 5 tablets (2500 mg) by mouth at bedtime    [provider]  albuterol (VENTOLIN HFA) 108 (90 Base) MCG/ACT inhaler Inhale 1 puff into the lungs every 6 (six) hours as needed for wheezing or shortness of breath. 07/15/20   Lavina Hamman, MD  allopurinol (ZYLOPRIM) 300 MG tablet Take 300 mg by mouth at bedtime. 08/12/19   [provider]  amLODipine (NORVASC) 5 MG tablet Take 1 tablet (5 mg total) by mouth daily. 03/09/21 07/07/21  Cantwell, Celeste C, PA-C  aspirin EC 81 MG tablet Take 81 mg  by mouth in the morning.    [provider]  carvedilol (COREG) 12.5 MG tablet Take 12.5 mg by mouth in the morning and at bedtime.    [provider]  CINNAMON PO Take 2,000 mg by mouth in the morning and at bedtime.    [provider]  cyclobenzaprine (FLEXERIL) 5 MG tablet Take 5 mg by mouth in the morning, at noon, and at bedtime. 11/10/20   [provider]  docusate sodium (COLACE) 100 MG capsule Take 200 mg by mouth 2 (two) times daily. 07/14/18   [provider]  ELDERBERRY PO Take 150 mg by mouth in the morning.    [provider]  estradiol (CLIMARA - DOSED IN MG/24 HR) 0.05 mg/24hr patch Place 0.05 mg onto the skin every Sunday. 03/24/20   [provider]  ferrous sulfate 325 (65 FE) MG tablet Take 325 mg by mouth in the morning.    [provider]  fluticasone (FLONASE) 50 MCG/ACT nasal spray Place 1 spray into both nostrils daily as needed for allergies or rhinitis.    [provider]  furosemide (LASIX) 40 MG tablet Take 1 tablet (40 mg total) by mouth daily. Patient taking differently: Take 40 mg by mouth See admin instructions. Take 2 tablets (80 mg) by mouth in the morning & take 1 tablet (40 mg)  by mouth at night. 07/26/20   Lajean Saver, MD  gemfibrozil (LOPID) 600 MG tablet Take 300-600 mg by mouth See admin instructions. Take 1 tablet (600 mg) by mouth in the morning and 0.5 tablet (300 mg) by mouth in the evening 09/09/17   [provider]  glimepiride (AMARYL) 4 MG tablet Take 4 mg by mouth in the morning. 06/15/19   [provider]  HYDROcodone-acetaminophen (NORCO/VICODIN) 5-325 MG tablet Take 1 tablet by mouth every 6 (six) hours as needed for moderate pain or severe pain. Patient taking differently: Take 1 tablet by mouth in the morning, at noon, in the evening, and at bedtime. 07/15/20   Lavina Hamman, MD  Insulin Aspart Prot & Aspart (NOVOLOG MIX 70/30 FLEXPEN Iago) Inject 25-45  Units into the skin See admin instructions. Inject 25 units subcutaneously in the morning & inject 45 units subcutaneously in the evening.    [provider]  INVOKANA 300 MG TABS tablet Take 300 mg by mouth every morning. 06/23/20   [provider]  levothyroxine (SYNTHROID) 175 MCG tablet Take 175 mcg by mouth daily before breakfast.    [provider]  lurasidone (LATUDA) 20 MG TABS tablet Take 20 mg by mouth at bedtime.    [provider]  magnesium oxide (MAG-OX) 400 (240 Mg) MG tablet Take 400 mg by mouth 2 (two) times daily. 10/01/20   [provider]  meclizine (ANTIVERT) 25 MG tablet Take 25 mg by mouth 3 (three) times daily as needed for dizziness. 06/03/19   [provider]  Melatonin 5 MG TABS Take 10 mg by mouth at bedtime.    [provider]  Multiple Vitamins-Minerals (ADULT ONE DAILY GUMMIES PO) Take 1 tablet by mouth in the morning. Centrum for Women 50+ (Gummy)    [provider]  ondansetron (ZOFRAN-ODT) 8 MG disintegrating tablet Take 8 mg by mouth every 8 (eight) hours as needed for nausea or vomiting.    [provider]  pantoprazole (PROTONIX) 40 MG tablet Take 40 mg by mouth in the morning.    [provider]  polyethylene glycol (MIRALAX / GLYCOLAX) 17 g packet Take 17 g by mouth daily. Patient taking differently: Take 17 g by mouth daily as needed (constipation). 07/16/20   Lavina Hamman, MD  pregabalin (LYRICA) 150 MG capsule Take 1 capsule (150 mg total) by mouth daily. Patient taking differently: Take 150 mg by mouth in the morning, at noon, and at bedtime. 07/15/20   Lavina Hamman, MD  promethazine (PHENERGAN) 25 MG tablet Take 25 mg by mouth every 8 (eight) hours as needed for nausea or vomiting. 06/02/19   [provider]  sertraline (ZOLOFT) 50 MG tablet Take 50 mg by mouth every evening. 07/09/20   [provider]  sodium bicarbonate 650 MG tablet Take 1 tablet  (650 mg total) by mouth 3 (three) times daily. Patient taking differently: Take 1,300 mg by mouth in the morning and at bedtime. 07/15/20   Lavina Hamman, MD  triamcinolone cream (KENALOG) 0.1 % Apply 1 application topically 2 (two) times daily as needed (skin irritation).    [provider]  vitamin C (ASCORBIC ACID) 500 MG tablet Take 500 mg by mouth in the morning.    [provider]  Vitamin D, Ergocalciferol, (DRISDOL) 1.25 MG (50000 UNIT) CAPS capsule Take 50,000 Units by mouth every Monday.    [provider]      Allergies    Fish  oil, Iodinated contrast media, Other, Phenylephrine-guaifenesin, Propoxyphene, Hydromorphone hcl, Abilify [aripiprazole], Bee venom, Dhea [nutritional supplements], Dihydroergotamine, Divalproex sodium, Doxycycline, Erythromycin, Indomethacin, Risperidone and related, Shellfish allergy, Statins, Tape, Dihydroergotamine, Lactose intolerance (gi), Nsaids, and Sulfa antibiotics    Review of Systems   Review of Systems  Cardiovascular:  Positive for chest pain.    Physical Exam Updated Vital Signs BP 111/77   Pulse 82   Temp 98 F (36.7 C) (Oral)   Resp 11   LMP  (LMP Unknown)   SpO2 95%  Physical Exam Vitals and nursing note reviewed.  Constitutional:      Appearance: She is well-developed.  HENT:     Head: Normocephalic and atraumatic.  Cardiovascular:     Rate and Rhythm: Normal rate and regular rhythm.  Pulmonary:     Effort: No respiratory distress.     Breath sounds: No stridor.  Chest:     Chest wall: Tenderness (left sternal border) present.  Abdominal:     General: There is no distension.  Musculoskeletal:     Cervical back: Normal range of motion.     Right lower leg: No edema.     Left lower leg: No edema.  Neurological:     Mental Status: She is alert.     ED Results / Procedures / Treatments   Labs (all labs ordered are listed, but only abnormal results are displayed) Labs Reviewed  BASIC  METABOLIC PANEL - Abnormal; Notable for the following components:      Result Value   Chloride 95 (*)    Glucose, Bld 146 (*)    BUN 28 (*)    Creatinine, Ser 2.52 (*)    Calcium 8.4 (*)    GFR, Estimated 24 (*)    All other components within normal limits  CBC - Abnormal; Notable for the following components:   RBC 3.38 (*)    Hemoglobin 11.0 (*)    HCT 31.7 (*)    All other components within normal limits  I-STAT BETA HCG BLOOD, ED ([facility], [facility], [facility] ONLY)  TROPONIN I (HIGH SENSITIVITY)  TROPONIN I (HIGH SENSITIVITY)    EKG EKG Interpretation  Date/Time:  Friday September 28 2021 19:26:26 EDT Ventricular Rate:  99 PR Interval:  168 QRS Duration: 76 QT Interval:  358 QTC Calculation: 459 R Axis:   12 Text Interpretation: Normal sinus rhythm Nonspecific T wave abnormality Abnormal ECG When compared with ECG of 17-Jul-2021 18:08, PREVIOUS ECG IS PRESENT Confirmed by Merrily Pew [phone]) on 09/29/2021 2:54:36 AM  Radiology DG Chest 2 View  Result Date: 09/28/2021 CLINICAL DATA:  Chest pain. EXAM: CHEST - 2 VIEW COMPARISON:  Chest radiograph dated 02/14/2021. FINDINGS: No focal consolidation, pleural effusion, or pneumothorax. The cardiac silhouette is within normal limits. No acute osseous pathology. IMPRESSION: No active cardiopulmonary disease. Electronically Signed   By: Anner Crete M.D.   On: 09/28/2021 20:50    Procedures Procedures    Medications Ordered in ED Medications  predniSONE (DELTASONE) tablet 10 mg (10 mg Oral Given 09/29/21 1062)    ED Course/ Medical Decision Making/ A&P                           Medical Decision Making Risk Prescription drug management.   Pain seems to be in the chest wall. Ecg/troponins reassuring, doubt ACS. Has h/o provoked PE but not hypoxic, tachycardic or any other signs of PE. No trauma or e/o fracture. Patient can't take  NSAIDs so will do a low dose prednisone instead. Will fu w PCP on Monday to ensure things improving. Return here  if worsening.   Final Clinical Impression(s) / ED Diagnoses Final diagnoses:  Nonspecific chest pain    Rx / DC Orders ED Discharge Orders          Ordered    predniSONE (DELTASONE) 10 MG tablet  Daily with breakfast        09/29/21 0332              Akhil Piscopo, Corene Cornea, MD 09/29/21 [phone]

## 2021-10-05 ENCOUNTER — Telehealth: Payer: Medicare Other | Admitting: Physician Assistant

## 2021-10-05 DIAGNOSIS — S81801A Unspecified open wound, right lower leg, initial encounter: Secondary | ICD-10-CM

## 2021-10-05 DIAGNOSIS — L089 Local infection of the skin and subcutaneous tissue, unspecified: Secondary | ICD-10-CM | POA: Diagnosis not present

## 2021-10-05 DIAGNOSIS — S81802A Unspecified open wound, left lower leg, initial encounter: Secondary | ICD-10-CM | POA: Diagnosis not present

## 2021-10-05 DIAGNOSIS — S0123XA Puncture wound without foreign body of nose, initial encounter: Secondary | ICD-10-CM | POA: Diagnosis not present

## 2021-10-05 MED ORDER — CEPHALEXIN 500 MG PO CAPS: 500.0000 mg | ORAL_CAPSULE | Freq: Four times a day (QID) | ORAL | 0 refills | Status: DC

## 2021-10-05 NOTE — Progress Notes (Signed)
Virtual Visit Consent   Brandi Love, you are scheduled for a virtual visit with a [facility] provider today. Just as with appointments in the office, your consent must be obtained to participate. Your consent will be active for this visit and any virtual visit you may have with one of our providers in the next 365 days. If you have a MyChart account, a copy of this consent can be sent to you electronically.  As this is a virtual visit, video technology does not allow for your provider to perform a traditional examination. This may limit your provider's ability to fully assess your condition. If your provider identifies any concerns that need to be evaluated in person or the need to arrange testing (such as labs, EKG, etc.), we will make arrangements to do so. Although advances in technology are sophisticated, we cannot ensure that it will always work on either your end or our end. If the connection with a video visit is poor, the visit may have to be switched to a telephone visit. With either a video or telephone visit, we are not always able to ensure that we have a secure connection.  By engaging in this virtual visit, you consent to the provision of healthcare and authorize for your insurance to be billed (if applicable) for the services provided during this visit. Depending on your insurance coverage, you may receive a charge related to this service.  I need to obtain your verbal consent now. Are you willing to proceed with your visit today? Brandi Love has provided verbal consent on 10/05/2021 for a virtual visit (video or telephone). Mar Daring, PA-C  Date: 10/05/2021 6:31 PM  Virtual Visit via Video Note   I, Mar Daring, connected with  Brandi Love  ([id number], 1975/11/22) on 10/05/21 at  6:15 PM EDT by a video-enabled telemedicine application and verified that I am speaking with the correct person using two identifiers.  Location: Patient: Virtual Visit Location  Patient: Home Provider: Virtual Visit Location Provider: Home Office   I discussed the limitations of evaluation and management by telemedicine and the availability of in person appointments. The patient expressed understanding and agreed to proceed.    History of Present Illness: Brandi Love is a 45 y.o. who identifies as a female who was assigned female at birth, and is being seen today for possible infected nose piercing.  Having increased drainage, bleeding, crusting, and swelling from nose piercing. Reports she changed her nose ring and forgot to clean the jewelry.  Also has wounds on her legs bilaterally in different stages of healing with purulent drainage from 2 of them. Was give a steroid in the ER last Friday and this always increases her blood sugars and causes increased infections. She is also on dialysis.    Problems:  Patient Active Problem List   Diagnosis Date Noted   Acute kidney injury superimposed on chronic kidney disease ([place]) 07/10/2020   Hyperkalemia 07/10/2020   Deaf 10/01/2018   Hematochezia 10/01/2018   Hypertension associated with diabetes ([place]) 10/01/2018   Loss of appetite 10/01/2018   Mental health problem 10/01/2018   Seizures ([place]) 10/01/2018   Abdominal pain 07/02/2018   Gastroesophageal reflux disease without esophagitis 02/16/2018   Primary insomnia 02/16/2018   Prediabetes 01/28/2018   Insulin dependent type 2 diabetes mellitus ([place]) 01/28/2018   Syncope 08/26/2017   Acute renal failure superimposed on stage 3 chronic kidney disease ([place]) 08/26/2017   Orthostatic syncope  Self mutilating behavior 08/08/2017   Acne 06/11/2017   Self-care deficit for bathing 06/11/2017   Urinary tract infection with hematuria    OCD (obsessive compulsive disorder) 09/29/2016   Anorexia nervosa with bulimia 09/29/2016   Deafness congenital 09/29/2016   Intractable nausea and vomiting 09/29/2016   Bloody diarrhea 09/29/2016   AKI (acute kidney injury) ([place])  09/29/2016   Severe dehydration 09/29/2016   Severe protein-calorie malnutrition ([place]) 09/29/2016   Congenital hypothyroidism 09/29/2016   Hypertriglyceridemia 09/29/2016   Anemia 09/29/2016   Endometriosis 09/29/2016   MDD (major depressive disorder) 09/29/2016   Hyperlipidemia 09/29/2016   CKD (chronic kidney disease), stage III ([place]) 09/29/2016   Acute kidney injury ([place]) 09/29/2016   OAB (overactive bladder) 09/29/2016   Sepsis ([place]) 09/18/2016   Acute pyelonephritis 09/18/2016   Urinary incontinence 08/09/2015   MDD (major depressive disorder), recurrent, severe, with psychosis ([place]) 12/11/2014   Severe recurrent major depressive disorder with psychotic symptoms ([place]) 12/11/2014   Migraine 07/02/2013   Eating disorder, unspecified 11/23/2012   Hypothyroidism 11/23/2012   Hyperlipidemia associated with type 2 diabetes mellitus ([place]) 11/23/2012   Sinus tachycardia 11/23/2012   CKD (chronic kidney disease) stage 3, GFR 30-59 ml/min (HCC) 11/18/2012   Protein-calorie malnutrition, severe ([place]) 11/13/2012   Abdominal pain, epigastric 11/12/2012   Dehydration 11/12/2012   Acute on chronic renal failure ([place]) 11/12/2012   Leukocytosis, unspecified 11/12/2012   Hyponatremia 11/12/2012   Borderline personality disorder ([place]) 11/12/2012    Allergies:  Allergies  Allergen Reactions   Fish Oil Anaphylaxis    Facial edema, itching   Iodinated Contrast Media Anaphylaxis    'cant breathe'   Other Itching, Swelling, Rash and Anaphylaxis    Unable to take due to renal insufficency Unable to ttake due to renal insufficency Eyes swell 'cant breathe' 'cant breathe' 'cant breathe' 'cant breathe' Other reaction(s): Other (See Comments) Patient stated she can not take because she has renal insufficiency Unable to ttake due to renal insufficency 'cant breathe' Other reaction(s): Other (See Comments) [place] Food   Phenylephrine-Guaifenesin Shortness Of Breath and Swelling    Throat  swelling   Propoxyphene Anaphylaxis   Hydromorphone Hcl Itching   Abilify [Aripiprazole] Nausea And Vomiting   Bee Venom Itching and Swelling    Facial swelling   Dhea [Nutritional Supplements] Nausea And Vomiting and Swelling   Dihydroergotamine Nausea And Vomiting   Divalproex Sodium Nausea And Vomiting and Other (See Comments)    Weight gain   Doxycycline Nausea And Vomiting   Erythromycin Nausea And Vomiting   Indomethacin Nausea And Vomiting    Renal function   Risperidone And Related Other (See Comments)    Out of body feeling   Shellfish Allergy Itching and Swelling    Eyes/throat/facial swelling   Statins Other (See Comments)    Cannot tolerate (weight gain)   Tape Other (See Comments)    Redness/ please use paper tape   Dihydroergotamine Nausea And Vomiting   Lactose Intolerance (Gi) Nausea And Vomiting   Nsaids Other (See Comments)    Unable to ttake due to renal insufficency   Sulfa Antibiotics Itching    Redness/ please use paper tape Redness, can use paper tape   Medications:  Current Outpatient Medications:    cephALEXin (KEFLEX) 500 MG capsule, Take 1 capsule (500 mg total) by mouth 4 (four) times daily., Disp: 40 capsule, Rfl: 0   acetaminophen (TYLENOL) 500 MG tablet, Take 1,000-2,500 mg by mouth See admin instructions. Take 2 tablets (1000 mg) by  mouth in the morning, and take 5 tablets (2500 mg) by mouth at bedtime, Disp: , Rfl:    albuterol (VENTOLIN HFA) 108 (90 Base) MCG/ACT inhaler, Inhale 1 puff into the lungs every 6 (six) hours as needed for wheezing or shortness of breath., Disp: 8 g, Rfl: 0   allopurinol (ZYLOPRIM) 300 MG tablet, Take 300 mg by mouth at bedtime., Disp: , Rfl:    amLODipine (NORVASC) 5 MG tablet, Take 1 tablet (5 mg total) by mouth daily., Disp: 30 tablet, Rfl: 3   aspirin EC 81 MG tablet, Take 81 mg by mouth in the morning., Disp: , Rfl:    carvedilol (COREG) 12.5 MG tablet, Take 12.5 mg by mouth in the morning and at bedtime., Disp:  , Rfl:    CINNAMON PO, Take 2,000 mg by mouth in the morning and at bedtime., Disp: , Rfl:    cyclobenzaprine (FLEXERIL) 5 MG tablet, Take 5 mg by mouth in the morning, at noon, and at bedtime., Disp: , Rfl:    docusate sodium (COLACE) 100 MG capsule, Take 200 mg by mouth 2 (two) times daily., Disp: , Rfl:    ELDERBERRY PO, Take 150 mg by mouth in the morning., Disp: , Rfl:    estradiol (CLIMARA - DOSED IN MG/24 HR) 0.05 mg/24hr patch, Place 0.05 mg onto the skin every Sunday., Disp: , Rfl:    ferrous sulfate 325 (65 FE) MG tablet, Take 325 mg by mouth in the morning., Disp: , Rfl:    fluticasone (FLONASE) 50 MCG/ACT nasal spray, Place 1 spray into both nostrils daily as needed for allergies or rhinitis., Disp: , Rfl:    furosemide (LASIX) 40 MG tablet, Take 1 tablet (40 mg total) by mouth daily. (Patient taking differently: Take 40 mg by mouth See admin instructions. Take 2 tablets (80 mg) by mouth in the morning & take 1 tablet (40 mg) by mouth at night.), Disp: 15 tablet, Rfl: 0   gemfibrozil (LOPID) 600 MG tablet, Take 300-600 mg by mouth See admin instructions. Take 1 tablet (600 mg) by mouth in the morning and 0.5 tablet (300 mg) by mouth in the evening, Disp: , Rfl:    glimepiride (AMARYL) 4 MG tablet, Take 4 mg by mouth in the morning., Disp: , Rfl:    HYDROcodone-acetaminophen (NORCO/VICODIN) 5-325 MG tablet, Take 1 tablet by mouth every 6 (six) hours as needed for moderate pain or severe pain. (Patient taking differently: Take 1 tablet by mouth in the morning, at noon, in the evening, and at bedtime.), Disp: 20 tablet, Rfl: 0   Insulin Aspart Prot & Aspart (NOVOLOG MIX 70/30 FLEXPEN [place]), Inject 25-45 Units into the skin See admin instructions. Inject 25 units subcutaneously in the morning & inject 45 units subcutaneously in the evening., Disp: , Rfl:    INVOKANA 300 MG TABS tablet, Take 300 mg by mouth every morning., Disp: , Rfl:    levothyroxine (SYNTHROID) 175 MCG tablet, Take 175 mcg by  mouth daily before breakfast., Disp: , Rfl:    lurasidone (LATUDA) 20 MG TABS tablet, Take 20 mg by mouth at bedtime., Disp: , Rfl:    magnesium oxide (MAG-OX) 400 (240 Mg) MG tablet, Take 400 mg by mouth 2 (two) times daily., Disp: , Rfl:    meclizine (ANTIVERT) 25 MG tablet, Take 25 mg by mouth 3 (three) times daily as needed for dizziness., Disp: , Rfl:    Melatonin 5 MG TABS, Take 10 mg by mouth at bedtime., Disp: , Rfl:  Multiple Vitamins-Minerals (ADULT ONE DAILY GUMMIES PO), Take 1 tablet by mouth in the morning. Centrum for Women 50+ (Gummy), Disp: , Rfl:    ondansetron (ZOFRAN-ODT) 8 MG disintegrating tablet, Take 8 mg by mouth every 8 (eight) hours as needed for nausea or vomiting., Disp: , Rfl:    pantoprazole (PROTONIX) 40 MG tablet, Take 40 mg by mouth in the morning., Disp: , Rfl:    polyethylene glycol (MIRALAX / GLYCOLAX) 17 g packet, Take 17 g by mouth daily. (Patient taking differently: Take 17 g by mouth daily as needed (constipation).), Disp: 14 each, Rfl: 0   predniSONE (DELTASONE) 10 MG tablet, Take 1 tablet (10 mg total) by mouth daily with breakfast for 7 days., Disp: 7 tablet, Rfl: 0   pregabalin (LYRICA) 150 MG capsule, Take 1 capsule (150 mg total) by mouth daily. (Patient taking differently: Take 150 mg by mouth in the morning, at noon, and at bedtime.), Disp: , Rfl:    promethazine (PHENERGAN) 25 MG tablet, Take 25 mg by mouth every 8 (eight) hours as needed for nausea or vomiting., Disp: , Rfl:    sertraline (ZOLOFT) 50 MG tablet, Take 50 mg by mouth every evening., Disp: , Rfl:    sodium bicarbonate 650 MG tablet, Take 1 tablet (650 mg total) by mouth 3 (three) times daily. (Patient taking differently: Take 1,300 mg by mouth in the morning and at bedtime.), Disp: 30 tablet, Rfl: 0   triamcinolone cream (KENALOG) 0.1 %, Apply 1 application topically 2 (two) times daily as needed (skin irritation)., Disp: , Rfl:    vitamin C (ASCORBIC ACID) 500 MG tablet, Take 500 mg by  mouth in the morning., Disp: , Rfl:    Vitamin D, Ergocalciferol, (DRISDOL) 1.25 MG (50000 UNIT) CAPS capsule, Take 50,000 Units by mouth every Monday., Disp: , Rfl:   Observations/Objective: Patient is well-developed, well-nourished in no acute distress.  Resting comfortably at home.  Head is normocephalic, atraumatic.  No labored breathing.  Speech is clear and coherent with logical content.  Patient is alert and oriented at baseline.  Nose piercing does appear to be swollen, red, with a lot of crusted discharge surrounding it Legs are swollen, red, with open wound noted on each lateral leg just distal to the knee bilaterally with dry eschar and surrounding erythema. Patient reports purulent drainage from each   Assessment and Plan: 1. Infected nasal piercing - cephALEXin (KEFLEX) 500 MG capsule; Take 1 capsule (500 mg total) by mouth 4 (four) times daily.  Dispense: 40 capsule; Refill: 0  2. Leg wound, right, initial encounter - cephALEXin (KEFLEX) 500 MG capsule; Take 1 capsule (500 mg total) by mouth 4 (four) times daily.  Dispense: 40 capsule; Refill: 0  3. Leg wound, left, initial encounter - cephALEXin (KEFLEX) 500 MG capsule; Take 1 capsule (500 mg total) by mouth 4 (four) times daily.  Dispense: 40 capsule; Refill: 0  - Keflex prescribed for infections - High risk for complications so if any of the areas worsen she is to seek in person evaluation immediately - Discussed wound care in detail verbally, patient voiced understanding  Follow Up Instructions: I discussed the assessment and treatment plan with the patient. The patient was provided an opportunity to ask questions and all were answered. The patient agreed with the plan and demonstrated an understanding of the instructions.  A copy of instructions were sent to the patient via MyChart unless otherwise noted below.    The patient was advised to call back  or seek an in-person evaluation if the symptoms worsen or if the  condition fails to improve as anticipated.  Time:  I spent 18 minutes with the patient via telehealth technology discussing the above problems/concerns.    Mar Daring, PA-C

## 2021-10-05 NOTE — Patient Instructions (Signed)
[facility], thank you for joining Mar Daring, PA-C for today's virtual visit.  While this provider is not your primary care provider (PCP), if your PCP is located in our provider database this encounter information will be shared with them immediately following your visit.  Consent: (Patient) Brandi Love provided verbal consent for this virtual visit at the beginning of the encounter.  Current Medications:  Current Outpatient Medications:    cephALEXin (KEFLEX) 500 MG capsule, Take 1 capsule (500 mg total) by mouth 4 (four) times daily., Disp: 40 capsule, Rfl: 0   acetaminophen (TYLENOL) 500 MG tablet, Take 1,000-2,500 mg by mouth See admin instructions. Take 2 tablets (1000 mg) by mouth in the morning, and take 5 tablets (2500 mg) by mouth at bedtime, Disp: , Rfl:    albuterol (VENTOLIN HFA) 108 (90 Base) MCG/ACT inhaler, Inhale 1 puff into the lungs every 6 (six) hours as needed for wheezing or shortness of breath., Disp: 8 g, Rfl: 0   allopurinol (ZYLOPRIM) 300 MG tablet, Take 300 mg by mouth at bedtime., Disp: , Rfl:    amLODipine (NORVASC) 5 MG tablet, Take 1 tablet (5 mg total) by mouth daily., Disp: 30 tablet, Rfl: 3   aspirin EC 81 MG tablet, Take 81 mg by mouth in the morning., Disp: , Rfl:    carvedilol (COREG) 12.5 MG tablet, Take 12.5 mg by mouth in the morning and at bedtime., Disp: , Rfl:    CINNAMON PO, Take 2,000 mg by mouth in the morning and at bedtime., Disp: , Rfl:    cyclobenzaprine (FLEXERIL) 5 MG tablet, Take 5 mg by mouth in the morning, at noon, and at bedtime., Disp: , Rfl:    docusate sodium (COLACE) 100 MG capsule, Take 200 mg by mouth 2 (two) times daily., Disp: , Rfl:    ELDERBERRY PO, Take 150 mg by mouth in the morning., Disp: , Rfl:    estradiol (CLIMARA - DOSED IN MG/24 HR) 0.05 mg/24hr patch, Place 0.05 mg onto the skin every Sunday., Disp: , Rfl:    ferrous sulfate 325 (65 FE) MG tablet, Take 325 mg by mouth in the morning., Disp: , Rfl:     fluticasone (FLONASE) 50 MCG/ACT nasal spray, Place 1 spray into both nostrils daily as needed for allergies or rhinitis., Disp: , Rfl:    furosemide (LASIX) 40 MG tablet, Take 1 tablet (40 mg total) by mouth daily. (Patient taking differently: Take 40 mg by mouth See admin instructions. Take 2 tablets (80 mg) by mouth in the morning & take 1 tablet (40 mg) by mouth at night.), Disp: 15 tablet, Rfl: 0   gemfibrozil (LOPID) 600 MG tablet, Take 300-600 mg by mouth See admin instructions. Take 1 tablet (600 mg) by mouth in the morning and 0.5 tablet (300 mg) by mouth in the evening, Disp: , Rfl:    glimepiride (AMARYL) 4 MG tablet, Take 4 mg by mouth in the morning., Disp: , Rfl:    HYDROcodone-acetaminophen (NORCO/VICODIN) 5-325 MG tablet, Take 1 tablet by mouth every 6 (six) hours as needed for moderate pain or severe pain. (Patient taking differently: Take 1 tablet by mouth in the morning, at noon, in the evening, and at bedtime.), Disp: 20 tablet, Rfl: 0   Insulin Aspart Prot & Aspart (NOVOLOG MIX 70/30 FLEXPEN [place]), Inject 25-45 Units into the skin See admin instructions. Inject 25 units subcutaneously in the morning & inject 45 units subcutaneously in the evening., Disp: , Rfl:  INVOKANA 300 MG TABS tablet, Take 300 mg by mouth every morning., Disp: , Rfl:    levothyroxine (SYNTHROID) 175 MCG tablet, Take 175 mcg by mouth daily before breakfast., Disp: , Rfl:    lurasidone (LATUDA) 20 MG TABS tablet, Take 20 mg by mouth at bedtime., Disp: , Rfl:    magnesium oxide (MAG-OX) 400 (240 Mg) MG tablet, Take 400 mg by mouth 2 (two) times daily., Disp: , Rfl:    meclizine (ANTIVERT) 25 MG tablet, Take 25 mg by mouth 3 (three) times daily as needed for dizziness., Disp: , Rfl:    Melatonin 5 MG TABS, Take 10 mg by mouth at bedtime., Disp: , Rfl:    Multiple Vitamins-Minerals (ADULT ONE DAILY GUMMIES PO), Take 1 tablet by mouth in the morning. Centrum for Women 50+ (Gummy), Disp: , Rfl:    ondansetron  (ZOFRAN-ODT) 8 MG disintegrating tablet, Take 8 mg by mouth every 8 (eight) hours as needed for nausea or vomiting., Disp: , Rfl:    pantoprazole (PROTONIX) 40 MG tablet, Take 40 mg by mouth in the morning., Disp: , Rfl:    polyethylene glycol (MIRALAX / GLYCOLAX) 17 g packet, Take 17 g by mouth daily. (Patient taking differently: Take 17 g by mouth daily as needed (constipation).), Disp: 14 each, Rfl: 0   predniSONE (DELTASONE) 10 MG tablet, Take 1 tablet (10 mg total) by mouth daily with breakfast for 7 days., Disp: 7 tablet, Rfl: 0   pregabalin (LYRICA) 150 MG capsule, Take 1 capsule (150 mg total) by mouth daily. (Patient taking differently: Take 150 mg by mouth in the morning, at noon, and at bedtime.), Disp: , Rfl:    promethazine (PHENERGAN) 25 MG tablet, Take 25 mg by mouth every 8 (eight) hours as needed for nausea or vomiting., Disp: , Rfl:    sertraline (ZOLOFT) 50 MG tablet, Take 50 mg by mouth every evening., Disp: , Rfl:    sodium bicarbonate 650 MG tablet, Take 1 tablet (650 mg total) by mouth 3 (three) times daily. (Patient taking differently: Take 1,300 mg by mouth in the morning and at bedtime.), Disp: 30 tablet, Rfl: 0   triamcinolone cream (KENALOG) 0.1 %, Apply 1 application topically 2 (two) times daily as needed (skin irritation)., Disp: , Rfl:    vitamin C (ASCORBIC ACID) 500 MG tablet, Take 500 mg by mouth in the morning., Disp: , Rfl:    Vitamin D, Ergocalciferol, (DRISDOL) 1.25 MG (50000 UNIT) CAPS capsule, Take 50,000 Units by mouth every Monday., Disp: , Rfl:    Medications ordered in this encounter:  Meds ordered this encounter  Medications   cephALEXin (KEFLEX) 500 MG capsule    Sig: Take 1 capsule (500 mg total) by mouth 4 (four) times daily.    Dispense:  40 capsule    Refill:  0    Order Specific Question:   Supervising Provider    Answer:   Sabra Heck, [place]     *If you need refills on other medications prior to your next appointment, please contact your  pharmacy*  Follow-Up: Call back or seek an in-person evaluation if the symptoms worsen or if the condition fails to improve as anticipated.  Other Instructions Keep areas clean and dry Wash with warm water and soap [facility] Saline spray for nasal piercing Clean around piercing with q tip to remove crusting and dried blood Do not change piercing until swelling is improved and drainage/bleeding has stopped   If you have been instructed to have  an in-person evaluation today at a local Urgent Care facility, please use the link below. It will take you to a list of all of our available [facility], including address, phone number and hours of operation. Please do not delay care.  [facility]  If you or a family member do not have a primary care provider, use the link below to schedule a visit and establish care. When you choose a [facility] primary care physician or advanced practice provider, you gain a long-term partner in health. Find a Primary Care Provider  Learn more about [facility] in-office and virtual care options: [facility]

## 2021-10-15 ENCOUNTER — Ambulatory Visit
Admission: EM | Admit: 2021-10-15 | Discharge: 2021-10-15 | Disposition: A | Discharge status: Home / Self Care | Payer: Medicare Other | Attending: Family Medicine | Admitting: Family Medicine

## 2021-10-15 DIAGNOSIS — R234 Changes in skin texture: Secondary | ICD-10-CM

## 2021-10-15 DIAGNOSIS — R0989 Other specified symptoms and signs involving the circulatory and respiratory systems: Secondary | ICD-10-CM

## 2021-10-15 DIAGNOSIS — Z889 Allergy status to unspecified drugs, medicaments and biological substances status: Secondary | ICD-10-CM

## 2021-10-15 MED ORDER — TRIAMCINOLONE ACETONIDE 40 MG/ML IJ SUSP
40.0000 mg | Freq: Once | INTRAMUSCULAR | Status: AC
  Administered 2021-10-15: 40 mg via INTRAMUSCULAR

## 2021-10-15 MED ORDER — PREDNISONE 20 MG PO TABS
40.0000 mg | ORAL_TABLET | Freq: Every day | ORAL | 0 refills | Status: AC
Start: 2021-10-15 — End: 2021-10-20

## 2021-10-15 MED ORDER — ALBUTEROL SULFATE (2.5 MG/3ML) 0.083% IN NEBU
2.5000 mg | INHALATION_SOLUTION | Freq: Once | RESPIRATORY_TRACT | Status: AC
  Administered 2021-10-15: 2.5 mg via RESPIRATORY_TRACT

## 2021-10-15 MED ORDER — MUPIROCIN 2 % EX OINT: 1.0000 | TOPICAL_OINTMENT | Freq: Two times a day (BID) | CUTANEOUS | 0 refills | Status: AC

## 2021-10-15 NOTE — ED Provider Notes (Signed)
[facility]    CSN: [id number] Arrival date & time: 10/15/21  1325      History   Chief Complaint No chief complaint on file.   HPI Brandi Love is a 46 y.o. female.   HPI Here for shortness of breath, throat itching, and itching in the rest of her body.  She states symptoms began about a week ago, but they worsened overnight.  She has been taking Keflex since she was discharged from the hospital about a week ago.Marland Kitchen  She was in the hospital for a hypoglycemic episode.  She was on the antibiotic for possible cellulitis  Past medical history includes diabetes, renal failure on dialysis.  She is deaf  Past Medical History:  Diagnosis Date   ADHD (attention deficit hyperactivity disorder)    Anemia    Anorexia    Anxiety    Arthritis    "hands" (09/18/2016)   Borderline personality disorder (HCC)    Chronic kidney disease    Chronic lower back pain    Chronic renal insufficiency    Claustrophobia    Deaf    Deafness    Depression    Diabetes (HCC)    "borderline"   DVT (deep venous thrombosis) (HCC) 2003   "one month after hysterectomy"   Eating disorder    Endometriosis    Family history of adverse reaction to anesthesia    Father N/V   GERD (gastroesophageal reflux disease)    History of kidney stones    Hyperlipidemia    Hypertension    Hypothyroidism    "born without a thyroid gland"   MDD (major depressive disorder)    Migraine    "frequency varies" (09/18/2016)   OCD (obsessive compulsive disorder)    Pneumonia 2011   PONV (postoperative nausea and vomiting)    I have anxiety  also n/v with the mask   Psychosis (HCC)    with depression   Pulmonary embolism (HCC) 10/2001   Seizures (HCC) 09/2001 X 1   "day after my hysterectomy"   Seizures (HCC)    05/03/21 reported that was the only one ever and that Narcan helped.   Self mutilating behavior    Tachycardia     Patient Active Problem List   Diagnosis Date Noted   Acute kidney injury  superimposed on chronic kidney disease (HCC) 07/10/2020   Hyperkalemia 07/10/2020   Deaf 10/01/2018   Hematochezia 10/01/2018   Hypertension associated with diabetes (HCC) 10/01/2018   Loss of appetite 10/01/2018   Mental health problem 10/01/2018   Seizures (HCC) 10/01/2018   Abdominal pain 07/02/2018   Gastroesophageal reflux disease without esophagitis 02/16/2018   Primary insomnia 02/16/2018   Prediabetes 01/28/2018   Insulin dependent type 2 diabetes mellitus (HCC) 01/28/2018   Syncope 08/26/2017   Acute renal failure superimposed on stage 3 chronic kidney disease (HCC) 08/26/2017   Orthostatic syncope    Self mutilating behavior 08/08/2017   Acne 06/11/2017   Self-care deficit for bathing 06/11/2017   Urinary tract infection with hematuria    OCD (obsessive compulsive disorder) 09/29/2016   Anorexia nervosa with bulimia 09/29/2016   Deafness congenital 09/29/2016   Intractable nausea and vomiting 09/29/2016   Bloody diarrhea 09/29/2016   AKI (acute kidney injury) (HCC) 09/29/2016   Severe dehydration 09/29/2016   Severe protein-calorie malnutrition (HCC) 09/29/2016   Congenital hypothyroidism 09/29/2016   Hypertriglyceridemia 09/29/2016   Anemia 09/29/2016   Endometriosis 09/29/2016   MDD (major depressive disorder) 09/29/2016   Hyperlipidemia  09/29/2016   CKD (chronic kidney disease), stage III (HCC) 09/29/2016   Acute kidney injury (HCC) 09/29/2016   OAB (overactive bladder) 09/29/2016   Sepsis (HCC) 09/18/2016   Acute pyelonephritis 09/18/2016   Urinary incontinence 08/09/2015   MDD (major depressive disorder), recurrent, severe, with psychosis (HCC) 12/11/2014   Severe recurrent major depressive disorder with psychotic symptoms (HCC) 12/11/2014   Migraine 07/02/2013   Eating disorder, unspecified 11/23/2012   Hypothyroidism 11/23/2012   Hyperlipidemia associated with type 2 diabetes mellitus (HCC) 11/23/2012   Sinus tachycardia 11/23/2012   CKD (chronic kidney  disease) stage 3, GFR 30-59 ml/min (HCC) 11/18/2012   Protein-calorie malnutrition, severe (HCC) 11/13/2012   Abdominal pain, epigastric 11/12/2012   Dehydration 11/12/2012   Acute on chronic renal failure (HCC) 11/12/2012   Leukocytosis, unspecified 11/12/2012   Hyponatremia 11/12/2012   Borderline personality disorder (HCC) 11/12/2012    Past Surgical History:  Procedure Laterality Date   ABDOMINAL HYSTERECTOMY  09/2001   'except for right ovary'   AV FISTULA PLACEMENT Right 05/04/2021   Procedure: RIGHT ARM BRACHIOCEPHALIC  ARTERIOVENOUS FISTULA CREATION;  Surgeon: Maeola Harman, MD;  Location: [facility] OR;  Service: Vascular;  Laterality: Right;   BREAST LUMPECTOMY Left 2000   benign   BREAST LUMPECTOMY Right 2002   benign   COCHLEAR IMPLANT Bilateral 2004-2010   "right-left"   COLONOSCOPY  06/2015    Dr Octaviano Glow at [facility] for rectal bleeding: internal hemorrhoids, small anal fissure.     ESOPHAGOGASTRODUODENOSCOPY N/A 10/07/2016   Procedure: ESOPHAGOGASTRODUODENOSCOPY (EGD);  Surgeon: Meryl Dare, MD;  Location: [facility] ENDOSCOPY;  Service: Endoscopy;  Laterality: N/A;   EYE MUSCLE SURGERY Left 1980   "lazy eye"   EYE MUSCLE SURGERY Right 1990   "lazy eye"   LAPAROSCOPIC ABDOMINAL EXPLORATION  2001 and 2002   for endometriosis   OOPHORECTOMY Right 08/2019    OB History   No obstetric history on file.      Home Medications    Prior to Admission medications   Medication Sig Start Date End Date Taking? Authorizing Provider  mupirocin ointment (BACTROBAN) 2 % Apply 1 Application topically 2 (two) times daily. To affected area till better 10/15/21  Yes Kailly Richoux, Janace Aris, MD  predniSONE (DELTASONE) 20 MG tablet Take 2 tablets (40 mg total) by mouth daily with breakfast for 5 days. 10/15/21 10/20/21 Yes Revin Corker, Janace Aris, MD  acetaminophen (TYLENOL) 500 MG tablet Take 1,000-2,500 mg by mouth See admin instructions. Take 2 tablets (1000 mg) by mouth in the  morning, and take 5 tablets (2500 mg) by mouth at bedtime    [provider]  albuterol (VENTOLIN HFA) 108 (90 Base) MCG/ACT inhaler Inhale 1 puff into the lungs every 6 (six) hours as needed for wheezing or shortness of breath. 07/15/20   Rolly Salter, MD  allopurinol (ZYLOPRIM) 300 MG tablet Take 300 mg by mouth at bedtime. 08/12/19   [provider]  amLODipine (NORVASC) 5 MG tablet Take 1 tablet (5 mg total) by mouth daily. 03/09/21 07/07/21  Cantwell, Celeste C, PA-C  aspirin EC 81 MG tablet Take 81 mg by mouth in the morning.    [provider]  carvedilol (COREG) 12.5 MG tablet Take 12.5 mg by mouth in the morning and at bedtime.    [provider]  cephALEXin (KEFLEX) 500 MG capsule Take 1 capsule (500 mg total) by mouth 4 (four) times daily. 10/05/21   Margaretann Loveless, PA-C  CINNAMON PO Take 2,000  mg by mouth in the morning and at bedtime.    [provider]  cyclobenzaprine (FLEXERIL) 5 MG tablet Take 5 mg by mouth in the morning, at noon, and at bedtime. 11/10/20   [provider]  docusate sodium (COLACE) 100 MG capsule Take 200 mg by mouth 2 (two) times daily. 07/14/18   [provider]  ELDERBERRY PO Take 150 mg by mouth in the morning.    [provider]  estradiol (CLIMARA - DOSED IN MG/24 HR) 0.05 mg/24hr patch Place 0.05 mg onto the skin every Sunday. 03/24/20   [provider]  ferrous sulfate 325 (65 FE) MG tablet Take 325 mg by mouth in the morning.    [provider]  fluticasone (FLONASE) 50 MCG/ACT nasal spray Place 1 spray into both nostrils daily as needed for allergies or rhinitis.    [provider]  furosemide (LASIX) 40 MG tablet Take 1 tablet (40 mg total) by mouth daily. Patient taking differently: Take 40 mg by mouth See admin instructions. Take 2 tablets (80 mg) by mouth in the morning & take 1 tablet (40 mg) by mouth at night. 07/26/20   Cathren Laine, MD  gemfibrozil  (LOPID) 600 MG tablet Take 300-600 mg by mouth See admin instructions. Take 1 tablet (600 mg) by mouth in the morning and 0.5 tablet (300 mg) by mouth in the evening 09/09/17   [provider]  glimepiride (AMARYL) 4 MG tablet Take 4 mg by mouth in the morning. 06/15/19   [provider]  HYDROcodone-acetaminophen (NORCO/VICODIN) 5-325 MG tablet Take 1 tablet by mouth every 6 (six) hours as needed for moderate pain or severe pain. Patient taking differently: Take 1 tablet by mouth in the morning, at noon, in the evening, and at bedtime. 07/15/20   Rolly Salter, MD  Insulin Aspart Prot & Aspart (NOVOLOG MIX 70/30 FLEXPEN [place]) Inject 25-45 Units into the skin See admin instructions. Inject 25 units subcutaneously in the morning & inject 45 units subcutaneously in the evening.    [provider]  INVOKANA 300 MG TABS tablet Take 300 mg by mouth every morning. 06/23/20   [provider]  levothyroxine (SYNTHROID) 175 MCG tablet Take 175 mcg by mouth daily before breakfast.    [provider]  lurasidone (LATUDA) 20 MG TABS tablet Take 20 mg by mouth at bedtime.    [provider]  magnesium oxide (MAG-OX) 400 (240 Mg) MG tablet Take 400 mg by mouth 2 (two) times daily. 10/01/20   [provider]  meclizine (ANTIVERT) 25 MG tablet Take 25 mg by mouth 3 (three) times daily as needed for dizziness. 06/03/19   [provider]  Melatonin 5 MG TABS Take 10 mg by mouth at bedtime.    [provider]  Multiple Vitamins-Minerals (ADULT ONE DAILY GUMMIES PO) Take 1 tablet by mouth in the morning. Centrum for Women 50+ (Gummy)    [provider]  ondansetron (ZOFRAN-ODT) 8 MG disintegrating tablet Take 8 mg by mouth every 8 (eight) hours as needed for nausea or vomiting.    [provider]  pantoprazole (PROTONIX) 40 MG tablet Take 40 mg by mouth in the morning.    [provider]  polyethylene glycol (MIRALAX /  GLYCOLAX) 17 g packet Take 17 g by mouth daily. Patient taking differently: Take 17 g by mouth daily as needed (constipation). 07/16/20   Rolly Salter, MD  pregabalin (LYRICA) 150 MG capsule Take 1 capsule (  150 mg total) by mouth daily. Patient taking differently: Take 150 mg by mouth in the morning, at noon, and at bedtime. 07/15/20   Rolly Salter, MD  promethazine (PHENERGAN) 25 MG tablet Take 25 mg by mouth every 8 (eight) hours as needed for nausea or vomiting. 06/02/19   [provider]  sertraline (ZOLOFT) 50 MG tablet Take 50 mg by mouth every evening. 07/09/20   [provider]  sodium bicarbonate 650 MG tablet Take 1 tablet (650 mg total) by mouth 3 (three) times daily. Patient taking differently: Take 1,300 mg by mouth in the morning and at bedtime. 07/15/20   Rolly Salter, MD  triamcinolone cream (KENALOG) 0.1 % Apply 1 application topically 2 (two) times daily as needed (skin irritation).    [provider]  vitamin C (ASCORBIC ACID) 500 MG tablet Take 500 mg by mouth in the morning.    [provider]  Vitamin D, Ergocalciferol, (DRISDOL) 1.25 MG (50000 UNIT) CAPS capsule Take 50,000 Units by mouth every Monday.    [provider]    Family History Family History  Problem Relation Age of Onset   Cancer Father    Hyperlipidemia Father    Osteoarthritis Mother    Hyperlipidemia Mother     Social History Social History   Tobacco Use   Smoking status: Never   Smokeless tobacco: Never  Vaping Use   Vaping Use: Never used  Substance Use Topics   Alcohol use: Never   Drug use: Never     Allergies   Fish oil, Iodinated contrast media, Other, Phenylephrine-guaifenesin, Propoxyphene, Hydromorphone hcl, Abilify [aripiprazole], Bee venom, Dhea [nutritional supplements], Dihydroergotamine, Divalproex sodium, Doxycycline, Erythromycin, Indomethacin, Risperidone and related, Shellfish allergy, Statins, Tape, Dihydroergotamine,  Lactose intolerance (gi), Nsaids, and Sulfa antibiotics   Review of Systems Review of Systems   Physical Exam Triage Vital Signs ED Triage Vitals  Enc Vitals Group     BP 10/15/21 1342 127/87     Pulse Rate 10/15/21 1342 (!) 116     Resp 10/15/21 1342 20     Temp 10/15/21 1342 97.9 F (36.6 C)     Temp Source 10/15/21 1342 Oral     SpO2 10/15/21 1342 94 %     Weight --      Height --      Head Circumference --      Peak Flow --      Pain Score 10/15/21 1338 8     Pain Loc --      Pain Edu? --      Excl. in GC? --    No data found.  Updated Vital Signs BP 127/87 (BP Location: Left Arm) Comment (BP Location): Patient has a fistula in her right arm  Pulse (!) 116   Temp 97.9 F (36.6 C) (Oral)   Resp 20   LMP  (LMP Unknown)   SpO2 94%   Visual Acuity Right Eye Distance:   Left Eye Distance:   Bilateral Distance:    Right Eye Near:   Left Eye Near:    Bilateral Near:     Physical Exam Vitals reviewed.  Constitutional:      General: She is not in acute distress.    Appearance: She is not ill-appearing, toxic-appearing or diaphoretic.  HENT:     Nose: Nose normal.     Mouth/Throat:     Mouth: Mucous membranes are moist.     Pharynx: No oropharyngeal exudate or posterior oropharyngeal erythema.  Eyes:     Extraocular Movements: Extraocular movements intact.     Conjunctiva/sclera: Conjunctivae normal.     Pupils: Pupils are equal, round, and reactive to light.  Cardiovascular:     Rate and Rhythm: Normal rate and regular rhythm.     Heart sounds: No murmur heard. Pulmonary:     Breath sounds: No stridor. No wheezing, rhonchi or rales.     Comments: Though I do not hear any wheezing, the expiratory phase is diminished and an audible. Musculoskeletal:     Cervical back: Neck supple.  Lymphadenopathy:     Cervical: No cervical adenopathy.  Skin:    Coloration: Skin is not jaundiced or pale.     Comments: On both lower legs she has scattered eschars in  various stages of healing.  On each lower leg near her knee she has 1 angrier lesion that is about a centimeter to 1.5 cm in diameter, with a rim of erythema around it.  Neurological:     General: No focal deficit present.     Mental Status: She is oriented to person, place, and time.  Psychiatric:        Behavior: Behavior normal.      UC Treatments / Results  Labs (all labs ordered are listed, but only abnormal results are displayed) Labs Reviewed - No data to display  EKG   Radiology No results found.  Procedures Procedures (including critical care time)  Medications Ordered in UC Medications  triamcinolone acetonide (KENALOG-40) injection 40 mg (40 mg Intramuscular Given 10/15/21 1358)  albuterol (PROVENTIL) (2.5 MG/3ML) 0.083% nebulizer solution 2.5 mg (2.5 mg Nebulization Given 10/15/21 1358)    Initial Impression / Assessment and Plan / UC Course  I have reviewed the triage vital signs and the nursing notes.  Pertinent labs & imaging results that were available during my care of the patient were reviewed by me and considered in my medical decision making (see chart for details).     After she had received her Kenalog shot and albuterol treatment, she felt much better.  She has an inhaler at home actually to use.  She will take 5 days of prednisone, and is aware that that might make her sugars higher than usual.  Also on medicine and mupirocin for the lesions on her legs, and she is good to let her primary care know if they are not improving Final Clinical Impressions(s) / UC Diagnoses   Final diagnoses:  Drug allergy  Eschar     Discharge Instructions      You were given a shot of triamcinolone 40 mg  You were given an albuterol breathing treatment  Take prednisone 20 mg--2 daily for 5 days.  This can raise your sugars  Put mupirocin ointment 2 times daily on those angry spots on your legs until healed.  Let your primary care office know if they are not  improving sufficiently     ED Prescriptions     Medication Sig Dispense Auth. Provider   mupirocin ointment (BACTROBAN) 2 % Apply 1 Application topically 2 (two) times daily. To affected area till better 22 g Zenia Resides, MD   predniSONE (DELTASONE) 20 MG tablet Take 2 tablets (40 mg total) by mouth daily with breakfast for 5 days. 10 tablet Marlinda Mike Janace Aris, MD      I have reviewed the PDMP during this encounter.   Zenia Resides, MD 10/15/21 1416

## 2021-10-17 ENCOUNTER — Other Ambulatory Visit: Payer: Self-pay

## 2021-10-17 ENCOUNTER — Emergency Department (HOSPITAL_BASED_OUTPATIENT_CLINIC_OR_DEPARTMENT_OTHER): Payer: Medicare Other

## 2021-10-17 ENCOUNTER — Emergency Department (HOSPITAL_BASED_OUTPATIENT_CLINIC_OR_DEPARTMENT_OTHER)
Admission: EM | Admit: 2021-10-17 | Discharge: 2021-10-17 | Disposition: A | Discharge status: Home / Self Care | Payer: Medicare Other | Attending: Emergency Medicine | Admitting: Emergency Medicine

## 2021-10-17 ENCOUNTER — Encounter: Payer: Self-pay | Admitting: Emergency Medicine

## 2021-10-17 ENCOUNTER — Ambulatory Visit
Admission: EM | Admit: 2021-10-17 | Discharge: 2021-10-17 | Disposition: A | Discharge status: Home / Self Care | Payer: Medicare Other | Attending: Urgent Care | Admitting: Urgent Care

## 2021-10-17 DIAGNOSIS — Z794 Long term (current) use of insulin: Secondary | ICD-10-CM | POA: Diagnosis not present

## 2021-10-17 DIAGNOSIS — N186 End stage renal disease: Secondary | ICD-10-CM

## 2021-10-17 DIAGNOSIS — Z992 Dependence on renal dialysis: Secondary | ICD-10-CM | POA: Insufficient documentation

## 2021-10-17 DIAGNOSIS — T380X5A Adverse effect of glucocorticoids and synthetic analogues, initial encounter: Secondary | ICD-10-CM | POA: Diagnosis not present

## 2021-10-17 DIAGNOSIS — E1122 Type 2 diabetes mellitus with diabetic chronic kidney disease: Secondary | ICD-10-CM | POA: Diagnosis not present

## 2021-10-17 DIAGNOSIS — Z7982 Long term (current) use of aspirin: Secondary | ICD-10-CM | POA: Insufficient documentation

## 2021-10-17 DIAGNOSIS — E1165 Type 2 diabetes mellitus with hyperglycemia: Secondary | ICD-10-CM | POA: Diagnosis not present

## 2021-10-17 DIAGNOSIS — R1110 Vomiting, unspecified: Secondary | ICD-10-CM | POA: Diagnosis present

## 2021-10-17 DIAGNOSIS — R112 Nausea with vomiting, unspecified: Secondary | ICD-10-CM | POA: Insufficient documentation

## 2021-10-17 DIAGNOSIS — E119 Type 2 diabetes mellitus without complications: Secondary | ICD-10-CM

## 2021-10-17 DIAGNOSIS — R197 Diarrhea, unspecified: Secondary | ICD-10-CM

## 2021-10-17 DIAGNOSIS — T887XXA Unspecified adverse effect of drug or medicament, initial encounter: Secondary | ICD-10-CM

## 2021-10-17 LAB — COMPREHENSIVE METABOLIC PANEL
ALT: 15 U/L (ref 0–44)
AST: 11 U/L — ABNORMAL LOW (ref 15–41)
Albumin: 4.5 g/dL (ref 3.5–5.0)
Alkaline Phosphatase: 118 U/L (ref 38–126)
Anion gap: 14 (ref 5–15)
BUN: 45 mg/dL — ABNORMAL HIGH (ref 6–20)
CO2: 25 mmol/L (ref 22–32)
Calcium: 10 mg/dL (ref 8.9–10.3)
Chloride: 100 mmol/L (ref 98–111)
Creatinine, Ser: 5.11 mg/dL — ABNORMAL HIGH (ref 0.44–1.00)
GFR, Estimated: 10 mL/min — ABNORMAL LOW (ref >60)
Glucose, Bld: 193 mg/dL — ABNORMAL HIGH (ref 70–99)
Potassium: 3.9 mmol/L (ref 3.5–5.1)
Sodium: 139 mmol/L (ref 135–145)
Total Bilirubin: 0.6 mg/dL (ref 0.3–1.2)
Total Protein: 8.4 g/dL — ABNORMAL HIGH (ref 6.5–8.1)

## 2021-10-17 LAB — CBC WITH DIFFERENTIAL/PLATELET
Abs Immature Granulocytes: 0.04 10*3/uL (ref 0.00–0.07)
Basophils Absolute: 0 10*3/uL (ref 0.0–0.1)
Basophils Relative: 0 %
Eosinophils Absolute: 0 10*3/uL (ref 0.0–0.5)
Eosinophils Relative: 0 %
HCT: 39.2 % (ref 36.0–46.0)
Hemoglobin: 13.4 g/dL (ref 12.0–15.0)
Immature Granulocytes: 1 %
Lymphocytes Relative: 20 %
Lymphs Abs: 1.6 10*3/uL (ref 0.7–4.0)
MCH: 32.4 pg (ref 26.0–34.0)
MCHC: 34.2 g/dL (ref 30.0–36.0)
MCV: 94.7 fL (ref 80.0–100.0)
Monocytes Absolute: 0.7 10*3/uL (ref 0.1–1.0)
Monocytes Relative: 9 %
Neutro Abs: 5.8 10*3/uL (ref 1.7–7.7)
Neutrophils Relative %: 70 %
Platelets: 277 10*3/uL (ref 150–400)
RBC: 4.14 MIL/uL (ref 3.87–5.11)
RDW: 13.9 % (ref 11.5–15.5)
WBC: 8.2 10*3/uL (ref 4.0–10.5)
nRBC: 0 % (ref 0.0–0.2)

## 2021-10-17 LAB — POCT URINALYSIS DIP (MANUAL ENTRY)
Bilirubin, UA: NEGATIVE
Blood, UA: NEGATIVE
Glucose, UA: NEGATIVE mg/dL
Ketones, POC UA: NEGATIVE mg/dL
Leukocytes, UA: NEGATIVE
Nitrite, UA: NEGATIVE
Protein Ur, POC: >=300 mg/dL — AB
Spec Grav, UA: 1.02 (ref 1.010–1.025)
Urobilinogen, UA: 0.2 E.U./dL
pH, UA: 8.5 — AB (ref 5.0–8.0)

## 2021-10-17 LAB — LIPASE, BLOOD: Lipase: 42 U/L (ref 11–51)

## 2021-10-17 MED ORDER — ONDANSETRON HCL 4 MG/2ML IJ SOLN
4.0000 mg | Freq: Once | INTRAMUSCULAR | Status: AC
Start: 2021-10-17 — End: 2021-10-17
  Administered 2021-10-17: 4 mg via INTRAVENOUS
  Filled 2021-10-17: qty 2

## 2021-10-17 MED ORDER — PROMETHAZINE HCL 25 MG/ML IJ SOLN
INTRAMUSCULAR | Status: AC
  Filled 2021-10-17: qty 1

## 2021-10-17 MED ORDER — SODIUM CHLORIDE 0.9 % IV SOLN
12.5000 mg | Freq: Once | INTRAVENOUS | Status: AC
  Administered 2021-10-17: 12.5 mg via INTRAVENOUS
  Filled 2021-10-17: qty 0.5

## 2021-10-17 MED ORDER — MORPHINE SULFATE (PF) 4 MG/ML IV SOLN
4.0000 mg | Freq: Once | INTRAVENOUS | Status: AC
  Administered 2021-10-17: 4 mg via INTRAVENOUS
  Filled 2021-10-17: qty 1

## 2021-10-17 MED ORDER — SODIUM CHLORIDE 0.9 % IV BOLUS
500.0000 mL | Freq: Once | INTRAVENOUS | Status: AC
  Administered 2021-10-17: 500 mL via INTRAVENOUS

## 2021-10-17 MED ORDER — ONDANSETRON HCL 4 MG/2ML IJ SOLN
4.0000 mg | Freq: Once | INTRAMUSCULAR | Status: AC
  Administered 2021-10-17: 4 mg via INTRAMUSCULAR

## 2021-10-17 MED ORDER — ONDANSETRON HCL 4 MG/2ML IJ SOLN
4.0000 mg | Freq: Once | INTRAMUSCULAR | Status: AC
  Administered 2021-10-17: 4 mg via INTRAVENOUS
  Filled 2021-10-17: qty 2

## 2021-10-17 NOTE — Discharge Instructions (Addendum)
Please make sure you keep your appointment for your dialysis.  Continue to take the Zofran and Phenergan as prescribed.  Eat light meals including soups and broths.  If your symptoms persist and definitely if they get worse then please go to the emergency room.

## 2021-10-17 NOTE — ED Triage Notes (Signed)
Patient arrived via EMS; c/o weakness x 2 days; reported was seen at [facility] on Monday for same along w/ NVD; states she missed her dialysis today due to weakness;

## 2021-10-17 NOTE — ED Notes (Signed)
Patient reported "feels like something is coming up" after attempting to drink some water.

## 2021-10-17 NOTE — ED Triage Notes (Signed)
Per EMS, pt c/o N/V/D since yesterday. Pt is a dialysis patient and missed todays appointment.

## 2021-10-17 NOTE — ED Triage Notes (Addendum)
Pt here with N/V/D since last night. Has had chills and multiple episodes of vomiting and diarrhea. Pt took 2 pills of prednisone yesterday while at dialysis and this occurred afterward. Pt took phenergan and zofran twice without relief.

## 2021-10-17 NOTE — ED Provider Notes (Signed)
[facility] Provider Note   CSN: [id number] Arrival date & time: 10/17/21  1543     History  Chief Complaint  Patient presents with   Emesis   Diarrhea   Weakness    Brandi Love is a 46 y.o. female hx of ESRD on HD (last HD was yesterday), reflux, diabetes, here presenting with vomiting and diarrhea.  Patient states that this is a recurrent problem and she usually takes Zofran and Phenergan.  She states that she tried her oral dose but it did not help.  She was recently started on prednisone after she had a possible allergic reaction to Keflex. Went to urgent care and sent for hydration. She states that she just feels weak.  She did go to dialysis yesterday but supposed to go back today but missed her appointment.  Patient denies any chest pain or shortness of breath.  Denies any fevers.  She no longer urinates.   The history is provided by the patient.       Home Medications Prior to Admission medications   Medication Sig Start Date End Date Taking? Authorizing Provider  acetaminophen (TYLENOL) 500 MG tablet Take 1,000-2,500 mg by mouth See admin instructions. Take 2 tablets (1000 mg) by mouth in the morning, and take 5 tablets (2500 mg) by mouth at bedtime    [provider]  albuterol (VENTOLIN HFA) 108 (90 Base) MCG/ACT inhaler Inhale 1 puff into the lungs every 6 (six) hours as needed for wheezing or shortness of breath. 07/15/20   Lavina Hamman, MD  allopurinol (ZYLOPRIM) 300 MG tablet Take 300 mg by mouth at bedtime. 08/12/19   [provider]  amLODipine (NORVASC) 5 MG tablet Take 1 tablet (5 mg total) by mouth daily. 03/09/21 07/07/21  Cantwell, Celeste C, PA-C  aspirin EC 81 MG tablet Take 81 mg by mouth in the morning.    [provider]  carvedilol (COREG) 12.5 MG tablet Take 12.5 mg by mouth in the morning and at bedtime.    [provider]  cephALEXin (KEFLEX) 500 MG capsule Take 1 capsule (500 mg  total) by mouth 4 (four) times daily. 10/05/21   Mar Daring, PA-C  CINNAMON PO Take 2,000 mg by mouth in the morning and at bedtime.    [provider]  cyclobenzaprine (FLEXERIL) 5 MG tablet Take 5 mg by mouth in the morning, at noon, and at bedtime. 11/10/20   [provider]  docusate sodium (COLACE) 100 MG capsule Take 200 mg by mouth 2 (two) times daily. 07/14/18   [provider]  ELDERBERRY PO Take 150 mg by mouth in the morning.    [provider]  estradiol (CLIMARA - DOSED IN MG/24 HR) 0.05 mg/24hr patch Place 0.05 mg onto the skin every Sunday. 03/24/20   [provider]  ferrous sulfate 325 (65 FE) MG tablet Take 325 mg by mouth in the morning.    [provider]  fluticasone (FLONASE) 50 MCG/ACT nasal spray Place 1 spray into both nostrils daily as needed for allergies or rhinitis.    [provider]  furosemide (LASIX) 40 MG tablet Take 1 tablet (40 mg total) by mouth daily. Patient taking differently: Take 40 mg by mouth See admin instructions. Take 2 tablets (80 mg) by mouth in the morning & take 1 tablet (40 mg) by mouth at night. 07/26/20   Lajean Saver, MD  gemfibrozil (LOPID) 600 MG tablet Take 300-600 mg by mouth  See admin instructions. Take 1 tablet (600 mg) by mouth in the morning and 0.5 tablet (300 mg) by mouth in the evening 09/09/17   [provider]  glimepiride (AMARYL) 4 MG tablet Take 4 mg by mouth in the morning. 06/15/19   [provider]  HYDROcodone-acetaminophen (NORCO/VICODIN) 5-325 MG tablet Take 1 tablet by mouth every 6 (six) hours as needed for moderate pain or severe pain. Patient taking differently: Take 1 tablet by mouth in the morning, at noon, in the evening, and at bedtime. 07/15/20   Lavina Hamman, MD  Insulin Aspart Prot & Aspart (NOVOLOG MIX 70/30 FLEXPEN [place]) Inject 25-45 Units into the skin See admin instructions. Inject 25 units subcutaneously in the morning & inject  45 units subcutaneously in the evening.    [provider]  INVOKANA 300 MG TABS tablet Take 300 mg by mouth every morning. 06/23/20   [provider]  levothyroxine (SYNTHROID) 175 MCG tablet Take 175 mcg by mouth daily before breakfast.    [provider]  lurasidone (LATUDA) 20 MG TABS tablet Take 20 mg by mouth at bedtime.    [provider]  magnesium oxide (MAG-OX) 400 (240 Mg) MG tablet Take 400 mg by mouth 2 (two) times daily. 10/01/20   [provider]  meclizine (ANTIVERT) 25 MG tablet Take 25 mg by mouth 3 (three) times daily as needed for dizziness. 06/03/19   [provider]  Melatonin 5 MG TABS Take 10 mg by mouth at bedtime.    [provider]  Multiple Vitamins-Minerals (ADULT ONE DAILY GUMMIES PO) Take 1 tablet by mouth in the morning. Centrum for Women 50+ (Gummy)    [provider]  mupirocin ointment (BACTROBAN) 2 % Apply 1 Application topically 2 (two) times daily. To affected area till better 10/15/21   Barrett Henle, MD  ondansetron (ZOFRAN-ODT) 8 MG disintegrating tablet Take 8 mg by mouth every 8 (eight) hours as needed for nausea or vomiting.    [provider]  pantoprazole (PROTONIX) 40 MG tablet Take 40 mg by mouth in the morning.    [provider]  polyethylene glycol (MIRALAX / GLYCOLAX) 17 g packet Take 17 g by mouth daily. Patient taking differently: Take 17 g by mouth daily as needed (constipation). 07/16/20   Lavina Hamman, MD  predniSONE (DELTASONE) 20 MG tablet Take 2 tablets (40 mg total) by mouth daily with breakfast for 5 days. 10/15/21 10/20/21  Barrett Henle, MD  pregabalin (LYRICA) 150 MG capsule Take 1 capsule (150 mg total) by mouth daily. Patient taking differently: Take 150 mg by mouth in the morning, at noon, and at bedtime. 07/15/20   Lavina Hamman, MD  promethazine (PHENERGAN) 25 MG tablet Take 25 mg by mouth every 8 (eight) hours as needed for nausea or  vomiting. 06/02/19   [provider]  sertraline (ZOLOFT) 50 MG tablet Take 50 mg by mouth every evening. 07/09/20   [provider]  sodium bicarbonate 650 MG tablet Take 1 tablet (650 mg total) by mouth 3 (three) times daily. Patient taking differently: Take 1,300 mg by mouth in the morning and at bedtime. 07/15/20   Lavina Hamman, MD  triamcinolone cream (KENALOG) 0.1 % Apply 1 application topically 2 (two) times daily as needed (skin irritation).    [provider]  vitamin C (ASCORBIC ACID) 500 MG tablet Take 500 mg by mouth in the morning.    [provider]  Vitamin  D, Ergocalciferol, (DRISDOL) 1.25 MG (50000 UNIT) CAPS capsule Take 50,000 Units by mouth every Monday.    [provider]      Allergies    Fish oil, Iodinated contrast media, Other, Phenylephrine-guaifenesin, Propoxyphene, Hydromorphone hcl, Abilify [aripiprazole], Bee venom, Cephalexin, Dhea [nutritional supplements], Dihydroergotamine, Divalproex sodium, Doxycycline, Erythromycin, Indomethacin, Prednisone, Risperidone and related, Shellfish allergy, Statins, Tape, Dihydroergotamine, Lactose intolerance (gi), Nsaids, and Sulfa antibiotics    Review of Systems   Review of Systems  Gastrointestinal:  Positive for diarrhea and vomiting.  Neurological:  Positive for weakness.  All other systems reviewed and are negative.   Physical Exam Updated Vital Signs BP (!) 148/98   Pulse 94   Temp 97.8 F (36.6 C) (Oral)   Resp 17   Ht 5\' 8"  (1.727 m)   Wt 83 kg   LMP  (LMP Unknown)   SpO2 94%   BMI 27.83 kg/m  Physical Exam Vitals and nursing note reviewed.  Constitutional:      Appearance: Normal appearance.  HENT:     Head: Normocephalic.     Nose: Nose normal.     Mouth/Throat:     Mouth: Mucous membranes are moist.  Eyes:     Extraocular Movements: Extraocular movements intact.     Pupils: Pupils are equal, round, and reactive to light.  Cardiovascular:     Rate and  Rhythm: Normal rate and regular rhythm.     Pulses: Normal pulses.     Heart sounds: Normal heart sounds.  Pulmonary:     Effort: Pulmonary effort is normal.     Breath sounds: Normal breath sounds.  Abdominal:     General: Abdomen is flat.     Palpations: Abdomen is soft.     Comments: No abdominal tenderness  Musculoskeletal:        General: Normal range of motion.     Cervical back: Normal range of motion and neck supple.  Skin:    General: Skin is warm.     Capillary Refill: Capillary refill takes less than 2 seconds.  Neurological:     General: No focal deficit present.     Mental Status: She is alert and oriented to person, place, and time.  Psychiatric:        Mood and Affect: Mood normal.        Behavior: Behavior normal.     ED Results / Procedures / Treatments   Labs (all labs ordered are listed, but only abnormal results are displayed) Labs Reviewed  COMPREHENSIVE METABOLIC PANEL - Abnormal; Notable for the following components:      Result Value   Glucose, Bld 193 (*)    BUN 45 (*)    Creatinine, Ser 5.11 (*)    Total Protein 8.4 (*)    AST 11 (*)    GFR, Estimated 10 (*)    All other components within normal limits  CBC WITH DIFFERENTIAL/PLATELET  LIPASE, BLOOD    EKG EKG Interpretation  Date/Time:  Wednesday October 17 2021 16:04:31 EDT Ventricular Rate:  96 PR Interval:  156 QRS Duration: 92 QT Interval:  362 QTC Calculation: 458 R Axis:   32 Text Interpretation: Sinus rhythm Borderline T abnormalities, anterior leads No significant change since last tracing Confirmed by Wandra Arthurs [phone]) on 10/17/2021 5:05:34 PM  Radiology DG ABD ACUTE 2+V W 1V CHEST  Result Date: 10/17/2021 CLINICAL DATA:  Acute abdominal pain. EXAM: DG ABDOMEN ACUTE WITH 1 VIEW CHEST COMPARISON:  CT abdomen and pelvis  11/03/2020 FINDINGS: There is no evidence of dilated bowel loops or free intraperitoneal air. Overall, there is a paucity of bowel gas. No radiopaque calculi or  other significant radiographic abnormality is seen. There are phleboliths in the pelvis. Heart size and mediastinal contours are within normal limits. Both lungs are clear. IMPRESSION: 1. Nonobstructive, nonspecific bowel gas pattern. 2. Overall, there is a paucity of bowel gas. This is sometimes seen with fluid distended bowel. 3. No acute cardiopulmonary process. Electronically Signed   By: Ronney Asters M.D.   On: 10/17/2021 17:02    Procedures Procedures    Medications Ordered in ED Medications  ondansetron (ZOFRAN) injection 4 mg (4 mg Intravenous Given 10/17/21 1754)  sodium chloride 0.9 % bolus 500 mL ( Intravenous Stopped 10/17/21 1801)  promethazine (PHENERGAN) 12.5 mg in sodium chloride 0.9 % 50 mL IVPB (0 mg Intravenous Stopped 10/17/21 1811)  promethazine (PHENERGAN) 25 MG/ML injection (  Rx Charged 10/17/21 1757)  morphine (PF) 4 MG/ML injection 4 mg (4 mg Intravenous Given 10/17/21 1857)    ED Course/ Medical Decision Making/ A&P                           Medical Decision Making Brandi Love is a 46 y.o. female here presenting with nausea vomiting and diarrhea.  Likely mild gastroenteritis versus some gastroparesis.  She did miss dialysis today.  We will check CBC and CMP and lipase and give Zofran and Phenergan.  We will give 500 cc bolus and reassess.   7:30 PM I reviewed patient's labs and independently interpreted x-rays.  Patient's creatinine is 5 which is baseline.  Potassium is normal at 3.9.  X-ray showed some paucity of gas and no obvious obstruction.  Patient is able to tolerate p.o. after Zofran and Phenergan. Patient's parents are at bedside now.  I updated them.  Patient was recently started on steroids for possible allergic reaction to Keflex and I think this is a side effect of prednisone.  She has no rash and no signs of allergic reaction so we will hold off on prednisone and take Benadryl as needed only.  Patient has Zofran and Phenergan at home and she is stable for  discharge  Problems Addressed: Medication side effect: acute illness or injury Nausea and vomiting, unspecified vomiting type: acute illness or injury  Amount and/or Complexity of Data Reviewed Labs: ordered. Decision-making details documented in ED Course. Radiology: ordered and independent interpretation performed. Decision-making details documented in ED Course.  Risk Prescription drug management.    Final Clinical Impression(s) / ED Diagnoses Final diagnoses:  None    Rx / DC Orders ED Discharge Orders     None         Drenda Freeze, MD 10/17/21 Joen Laura

## 2021-10-17 NOTE — ED Provider Notes (Signed)
[place]   MRN: [id number] DOB: 18-Apr-1976  Subjective:   Brandi Love is a 46 y.o. female presenting for acute onset of nausea, vomiting, diarrhea that has been ongoing since last night.  Patient has felt hot and cold chills as well.  She has been on Keflex and prednisone.  She is on dialysis.  Is supposed to go today, also went yesterday.  She took Zofran last night and then Phenergan this morning but continues to have diarrhea.  No bloody stools, fevers.  No abdominal pain.  No current facility-administered medications for this encounter.  Current Outpatient Medications:    acetaminophen (TYLENOL) 500 MG tablet, Take 1,000-2,500 mg by mouth See admin instructions. Take 2 tablets (1000 mg) by mouth in the morning, and take 5 tablets (2500 mg) by mouth at bedtime, Disp: , Rfl:    albuterol (VENTOLIN HFA) 108 (90 Base) MCG/ACT inhaler, Inhale 1 puff into the lungs every 6 (six) hours as needed for wheezing or shortness of breath., Disp: 8 g, Rfl: 0   allopurinol (ZYLOPRIM) 300 MG tablet, Take 300 mg by mouth at bedtime., Disp: , Rfl:    amLODipine (NORVASC) 5 MG tablet, Take 1 tablet (5 mg total) by mouth daily., Disp: 30 tablet, Rfl: 3   aspirin EC 81 MG tablet, Take 81 mg by mouth in the morning., Disp: , Rfl:    carvedilol (COREG) 12.5 MG tablet, Take 12.5 mg by mouth in the morning and at bedtime., Disp: , Rfl:    cephALEXin (KEFLEX) 500 MG capsule, Take 1 capsule (500 mg total) by mouth 4 (four) times daily., Disp: 40 capsule, Rfl: 0   CINNAMON PO, Take 2,000 mg by mouth in the morning and at bedtime., Disp: , Rfl:    cyclobenzaprine (FLEXERIL) 5 MG tablet, Take 5 mg by mouth in the morning, at noon, and at bedtime., Disp: , Rfl:    docusate sodium (COLACE) 100 MG capsule, Take 200 mg by mouth 2 (two) times daily., Disp: , Rfl:    ELDERBERRY PO, Take 150 mg by mouth in the morning., Disp: , Rfl:    estradiol (CLIMARA - DOSED IN MG/24 HR) 0.05 mg/24hr patch,  Place 0.05 mg onto the skin every Sunday., Disp: , Rfl:    ferrous sulfate 325 (65 FE) MG tablet, Take 325 mg by mouth in the morning., Disp: , Rfl:    fluticasone (FLONASE) 50 MCG/ACT nasal spray, Place 1 spray into both nostrils daily as needed for allergies or rhinitis., Disp: , Rfl:    furosemide (LASIX) 40 MG tablet, Take 1 tablet (40 mg total) by mouth daily. (Patient taking differently: Take 40 mg by mouth See admin instructions. Take 2 tablets (80 mg) by mouth in the morning & take 1 tablet (40 mg) by mouth at night.), Disp: 15 tablet, Rfl: 0   gemfibrozil (LOPID) 600 MG tablet, Take 300-600 mg by mouth See admin instructions. Take 1 tablet (600 mg) by mouth in the morning and 0.5 tablet (300 mg) by mouth in the evening, Disp: , Rfl:    glimepiride (AMARYL) 4 MG tablet, Take 4 mg by mouth in the morning., Disp: , Rfl:    HYDROcodone-acetaminophen (NORCO/VICODIN) 5-325 MG tablet, Take 1 tablet by mouth every 6 (six) hours as needed for moderate pain or severe pain. (Patient taking differently: Take 1 tablet by mouth in the morning, at noon, in the evening, and at bedtime.), Disp: 20 tablet, Rfl: 0   Insulin Aspart Prot &  Aspart (NOVOLOG MIX 70/30 FLEXPEN [place]), Inject 25-45 Units into the skin See admin instructions. Inject 25 units subcutaneously in the morning & inject 45 units subcutaneously in the evening., Disp: , Rfl:    INVOKANA 300 MG TABS tablet, Take 300 mg by mouth every morning., Disp: , Rfl:    levothyroxine (SYNTHROID) 175 MCG tablet, Take 175 mcg by mouth daily before breakfast., Disp: , Rfl:    lurasidone (LATUDA) 20 MG TABS tablet, Take 20 mg by mouth at bedtime., Disp: , Rfl:    magnesium oxide (MAG-OX) 400 (240 Mg) MG tablet, Take 400 mg by mouth 2 (two) times daily., Disp: , Rfl:    meclizine (ANTIVERT) 25 MG tablet, Take 25 mg by mouth 3 (three) times daily as needed for dizziness., Disp: , Rfl:    Melatonin 5 MG TABS, Take 10 mg by mouth at bedtime., Disp: , Rfl:    Multiple  Vitamins-Minerals (ADULT ONE DAILY GUMMIES PO), Take 1 tablet by mouth in the morning. Centrum for Women 50+ (Gummy), Disp: , Rfl:    mupirocin ointment (BACTROBAN) 2 %, Apply 1 Application topically 2 (two) times daily. To affected area till better, Disp: 22 g, Rfl: 0   ondansetron (ZOFRAN-ODT) 8 MG disintegrating tablet, Take 8 mg by mouth every 8 (eight) hours as needed for nausea or vomiting., Disp: , Rfl:    pantoprazole (PROTONIX) 40 MG tablet, Take 40 mg by mouth in the morning., Disp: , Rfl:    polyethylene glycol (MIRALAX / GLYCOLAX) 17 g packet, Take 17 g by mouth daily. (Patient taking differently: Take 17 g by mouth daily as needed (constipation).), Disp: 14 each, Rfl: 0   predniSONE (DELTASONE) 20 MG tablet, Take 2 tablets (40 mg total) by mouth daily with breakfast for 5 days., Disp: 10 tablet, Rfl: 0   pregabalin (LYRICA) 150 MG capsule, Take 1 capsule (150 mg total) by mouth daily. (Patient taking differently: Take 150 mg by mouth in the morning, at noon, and at bedtime.), Disp: , Rfl:    promethazine (PHENERGAN) 25 MG tablet, Take 25 mg by mouth every 8 (eight) hours as needed for nausea or vomiting., Disp: , Rfl:    sertraline (ZOLOFT) 50 MG tablet, Take 50 mg by mouth every evening., Disp: , Rfl:    sodium bicarbonate 650 MG tablet, Take 1 tablet (650 mg total) by mouth 3 (three) times daily. (Patient taking differently: Take 1,300 mg by mouth in the morning and at bedtime.), Disp: 30 tablet, Rfl: 0   triamcinolone cream (KENALOG) 0.1 %, Apply 1 application topically 2 (two) times daily as needed (skin irritation)., Disp: , Rfl:    vitamin C (ASCORBIC ACID) 500 MG tablet, Take 500 mg by mouth in the morning., Disp: , Rfl:    Vitamin D, Ergocalciferol, (DRISDOL) 1.25 MG (50000 UNIT) CAPS capsule, Take 50,000 Units by mouth every Monday., Disp: , Rfl:    Allergies  Allergen Reactions   Fish Oil Anaphylaxis    Facial edema, itching   Iodinated Contrast Media Anaphylaxis    'cant  breathe'   Other Itching, Swelling, Rash and Anaphylaxis    Unable to take due to renal insufficency Unable to ttake due to renal insufficency Eyes swell 'cant breathe' 'cant breathe' 'cant breathe' 'cant breathe' Other reaction(s): Other (See Comments) Patient stated she can not take because she has renal insufficiency Unable to ttake due to renal insufficency 'cant breathe' Other reaction(s): Other (See Comments) [place] [employer]   Phenylephrine-Guaifenesin Shortness Of Breath and Swelling  Throat swelling   Propoxyphene Anaphylaxis   Hydromorphone Hcl Itching   Abilify [Aripiprazole] Nausea And Vomiting   Bee Venom Itching and Swelling    Facial swelling   Cephalexin    Dhea [Nutritional Supplements] Nausea And Vomiting and Swelling   Dihydroergotamine Nausea And Vomiting   Divalproex Sodium Nausea And Vomiting and Other (See Comments)    Weight gain   Doxycycline Nausea And Vomiting   Erythromycin Nausea And Vomiting   Indomethacin Nausea And Vomiting    Renal function   Prednisone    Risperidone And Related Other (See Comments)    Out of body feeling   Shellfish Allergy Itching and Swelling    Eyes/throat/facial swelling   Statins Other (See Comments)    Cannot tolerate (weight gain)   Tape Other (See Comments)    Redness/ please use paper tape   Dihydroergotamine Nausea And Vomiting   Lactose Intolerance (Gi) Nausea And Vomiting   Nsaids Other (See Comments)    Unable to ttake due to renal insufficency   Sulfa Antibiotics Itching    Redness/ please use paper tape Redness, can use paper tape    Past Medical History:  Diagnosis Date   ADHD (attention deficit hyperactivity disorder)    Anemia    Anorexia    Anxiety    Arthritis    "hands" (09/18/2016)   Borderline personality disorder ([place])    Chronic kidney disease    Chronic lower back pain    Chronic renal insufficiency    Claustrophobia    Deaf    Deafness    Depression    Diabetes ([place])     "borderline"   DVT (deep venous thrombosis) ([place]) 2003   "one month after hysterectomy"   Eating disorder    Endometriosis    Family history of adverse reaction to anesthesia    Father N/V   GERD (gastroesophageal reflux disease)    History of kidney stones    Hyperlipidemia    Hypertension    Hypothyroidism    "born without a thyroid gland"   MDD (major depressive disorder)    Migraine    "frequency varies" (09/18/2016)   OCD (obsessive compulsive disorder)    Pneumonia 2011   PONV (postoperative nausea and vomiting)    I have anxiety  also n/v with the mask   Psychosis ([place])    with depression   Pulmonary embolism ([place]) 10/2001   Seizures ([place]) 09/2001 X 1   "day after my hysterectomy"   Seizures ([place])    05/03/21 reported that was the only one ever and that Narcan helped.   Self mutilating behavior    Tachycardia      Past Surgical History:  Procedure Laterality Date   ABDOMINAL HYSTERECTOMY  09/2001   'except for right ovary'   AV FISTULA PLACEMENT Right 05/04/2021   Procedure: RIGHT ARM BRACHIOCEPHALIC  ARTERIOVENOUS FISTULA CREATION;  Surgeon: Waynetta Sandy, MD;  Location: [facility];  Service: Vascular;  Laterality: Right;   BREAST LUMPECTOMY Left 2000   benign   BREAST LUMPECTOMY Right 2002   benign   COCHLEAR IMPLANT Bilateral 2004-2010   "right-left"   COLONOSCOPY  06/2015    Dr Rolm Bookbinder at [facility] for rectal bleeding: internal hemorrhoids, small anal fissure.     ESOPHAGOGASTRODUODENOSCOPY N/A 10/07/2016   Procedure: ESOPHAGOGASTRODUODENOSCOPY (EGD);  Surgeon: Ladene Artist, MD;  [facility] ENDOSCOPY;  Service: Endoscopy;  Laterality: N/A;   EYE MUSCLE SURGERY Left 1980   "lazy eye"  EYE MUSCLE SURGERY Right 1990   "lazy eye"   LAPAROSCOPIC ABDOMINAL EXPLORATION  2001 and 2002   for endometriosis   OOPHORECTOMY Right 08/2019    Family History  Problem Relation Age of Onset   Cancer Father    Hyperlipidemia Father    Osteoarthritis  Mother    Hyperlipidemia Mother     Social History   Tobacco Use   Smoking status: Never   Smokeless tobacco: Never  Vaping Use   Vaping Use: Never used  Substance Use Topics   Alcohol use: Never   Drug use: Never    ROS   Objective:   Vitals: BP (!) 131/91   Pulse (!) 112   Temp 98.4 F (36.9 C)   Resp 20   LMP  (LMP Unknown)   SpO2 96%   Physical Exam Constitutional:      General: She is not in acute distress.    Appearance: Normal appearance. She is well-developed. She is not ill-appearing, toxic-appearing or diaphoretic.  HENT:     Head: Normocephalic and atraumatic.     Nose: Nose normal.     Mouth/Throat:     Mouth: Mucous membranes are moist.  Eyes:     General: No scleral icterus.       Right eye: No discharge.        Left eye: No discharge.     Extraocular Movements: Extraocular movements intact.     Conjunctiva/sclera: Conjunctivae normal.  Cardiovascular:     Rate and Rhythm: Normal rate and regular rhythm.     Heart sounds: Normal heart sounds. No murmur heard.    No friction rub. No gallop.  Pulmonary:     Effort: Pulmonary effort is normal. No respiratory distress.     Breath sounds: No stridor. No wheezing, rhonchi or rales.  Chest:     Chest wall: No tenderness.  Abdominal:     General: Bowel sounds are normal. There is no distension.     Palpations: Abdomen is soft. There is no mass.     Tenderness: There is no abdominal tenderness. There is no right CVA tenderness, left CVA tenderness, guarding or rebound.  Skin:    General: Skin is warm and dry.  Neurological:     General: No focal deficit present.     Mental Status: She is alert and oriented to person, place, and time.  Psychiatric:        Mood and Affect: Mood normal.        Behavior: Behavior normal.        Thought Content: Thought content normal.        Judgment: Judgment normal.    Results for orders placed or performed during the hospital encounter of 10/17/21 (from the  past 24 hour(s))  POCT urinalysis dipstick     Status: Abnormal   Collection Time: 10/17/21  8:29 AM  Result Value Ref Range   Color, UA light yellow (A) yellow   Clarity, UA clear clear   Glucose, UA negative negative mg/dL   Bilirubin, UA negative negative   Ketones, POC UA negative negative mg/dL   Spec Grav, UA 1.020 1.010 - 1.025   Blood, UA negative negative   pH, UA 8.5 (A) 5.0 - 8.0   Protein Ur, POC >=300 (A) negative mg/dL   Urobilinogen, UA 0.2 0.2 or 1.0 E.U./dL   Nitrite, UA Negative Negative   Leukocytes, UA Negative Negative    Assessment and Plan :   PDMP not reviewed  this encounter.  1. Nausea and vomiting, unspecified vomiting type   2. Diarrhea, unspecified type   3. ESRD on dialysis ([place])   4. Type 2 diabetes mellitus treated with insulin (HCC)    Suspect the nausea, vomiting and diarrhea are adverse effects from taking both Keflex and prednisone.  As it is, patient is very sensitive to medications.  IM Zofran was given in clinic at a dose of 4 mg.  Advised that she maintain dosing for Zofran and Phenergan.  Her blood sugar checked was 223 this morning and is reliable.  She does not have any ketonuria or signs of severe dehydration on urinalysis.  For now, will defer ER visit but advised that she maintain strict ER precautions.  Follow-up with PCP as soon as possible.   Jaynee Eagles, [place] 10/17/21 [phone]

## 2021-10-17 NOTE — Discharge Instructions (Signed)
You likely have medication side effect.  Please hold off on prednisone for now.  You can take Benadryl 25 mg every 6 hours as needed if you have itchiness or rash.  Avoid taking cephalexin in the future  Please continue taking your Zofran and Phenergan as prescribed by your doctor  See your doctor for follow-up.  Please go to dialysis tomorrow for an extra session of dialysis  Return to ER if you have worse vomiting, abdominal pain, trouble breathing

## 2021-10-25 ENCOUNTER — Ambulatory Visit: Payer: Medicare Other | Admitting: Student

## 2021-10-25 ENCOUNTER — Encounter: Payer: Self-pay | Admitting: Student

## 2021-10-25 VITALS — BP 139/89 | HR 108 | Temp 97.3°F | Resp 16 | Ht 68.0 in | Wt 178.4 lb

## 2021-10-25 DIAGNOSIS — N183 Chronic kidney disease, stage 3 unspecified: Secondary | ICD-10-CM

## 2021-10-25 DIAGNOSIS — I10 Essential (primary) hypertension: Secondary | ICD-10-CM

## 2021-10-25 MED ORDER — CARVEDILOL 6.25 MG PO TABS: 6.2500 mg | ORAL_TABLET | Freq: Two times a day (BID) | ORAL | 3 refills | Status: DC

## 2021-10-25 NOTE — Progress Notes (Signed)
Primary Physician/Referring:  Bernerd Limbo, MD  Patient ID: Brandi Love, female    DOB: 1975-09-02, 46 y.o.   MRN: [id number]  Chief Complaint  Patient presents with   Hypertension   Hyperlipidemia   Follow-up    6 months   HPI:    Brandi Love  is a 46 y.o. eating disorder,  depression with psychosis, borderline personality disorder, OCD, hypothyroidism, deafness with cochlear implants, and chronic kidney disease stage III, and is attributed to NSAID use and also patient once try to commit suicide by consuming excessive amounts of aspirin in the remote past, DM.  She is being followed at [facility] for familial hypertriglyceridemia.   Patient was last seen in the office 04/25/2021 at which time patient was stable from a cardiac standpoint, therefore no changes were made. She now presents for 6 month follow up.  Patient is no longer taking amlodipine, reports that nephrology discontinued this recently.  She is also been holding carvedilol since evaluation in the ED due to low blood pressure and emesis.  Blood pressure remains fairly well controlled although she has not taken carvedilol for the last 1 week.  She is fairly asymptomatic at this time.  Denies significant dyspnea.  Denies chest pain, palpitations, syncope, near syncope, orthopnea, PND, leg edema.  Past Medical History:  Diagnosis Date   ADHD (attention deficit hyperactivity disorder)    Anemia    Anorexia    Anxiety    Arthritis    "hands" (09/18/2016)   Borderline personality disorder ([place])    Chronic kidney disease    Chronic lower back pain    Chronic renal insufficiency    Claustrophobia    Deaf    Deafness    Depression    Diabetes ([place])    "borderline"   DVT (deep venous thrombosis) ([place]) 2003   "one month after hysterectomy"   Eating disorder    Endometriosis    Family history of adverse reaction to anesthesia    Father N/V   GERD (gastroesophageal reflux disease)    History of kidney stones     Hyperlipidemia    Hypertension    Hypothyroidism    "born without a thyroid gland"   MDD (major depressive disorder)    Migraine    "frequency varies" (09/18/2016)   OCD (obsessive compulsive disorder)    Pneumonia 2011   PONV (postoperative nausea and vomiting)    I have anxiety  also n/v with the mask   Psychosis ([place])    with depression   Pulmonary embolism ([place]) 10/2001   Seizures ([place]) 09/2001 X 1   "day after my hysterectomy"   Seizures ([place])    05/03/21 reported that was the only one ever and that Narcan helped.   Self mutilating behavior    Tachycardia    Past Surgical History:  Procedure Laterality Date   ABDOMINAL HYSTERECTOMY  09/2001   'except for right ovary'   AV FISTULA PLACEMENT Right 05/04/2021   Procedure: RIGHT ARM BRACHIOCEPHALIC  ARTERIOVENOUS FISTULA CREATION;  Surgeon: Waynetta Sandy, MD;  Location: [place];  Service: Vascular;  Laterality: Right;   BREAST LUMPECTOMY Left 2000   benign   BREAST LUMPECTOMY Right 2002   benign   COCHLEAR IMPLANT Bilateral 2004-2010   "right-left"   COLONOSCOPY  06/2015    Dr Rolm Bookbinder at [facility] for rectal bleeding: internal hemorrhoids, small anal fissure.     ESOPHAGOGASTRODUODENOSCOPY N/A 10/07/2016   Procedure: ESOPHAGOGASTRODUODENOSCOPY (EGD);  Surgeon:  Ladene Artist, MD;  Location: [facility] ENDOSCOPY;  Service: Endoscopy;  Laterality: N/A;   EYE MUSCLE SURGERY Left 1980   "lazy eye"   EYE MUSCLE SURGERY Right 1990   "lazy eye"   LAPAROSCOPIC ABDOMINAL EXPLORATION  2001 and 2002   for endometriosis   OOPHORECTOMY Right 08/2019   Family History  Problem Relation Age of Onset   Cancer Father    Hyperlipidemia Father    Osteoarthritis Mother    Hyperlipidemia Mother     Social History   Tobacco Use   Smoking status: Never   Smokeless tobacco: Never  Substance Use Topics   Alcohol use: Never   Marital Status: Single  ROS  Review of Systems  Constitutional: Negative for malaise/fatigue and  weight gain.  Cardiovascular:  Positive for dyspnea on exertion (stable). Negative for chest pain, claudication, leg swelling, near-syncope, orthopnea, palpitations, paroxysmal nocturnal dyspnea and syncope.  Respiratory:  Negative for shortness of breath.   Gastrointestinal:  Negative for melena.   Objective  Blood pressure 139/89, pulse (!) 108, temperature (!) 97.3 F (36.3 C), temperature source Temporal, resp. rate 16, height _0  (1.727 m), weight 178 lb 6.4 oz (80.9 kg), SpO2 97 %.     10/25/2021   12:42 PM 10/17/2021    8:09 PM 10/17/2021    8:00 PM  Vitals with BMI  Height _1     Weight 178 lbs 6 oz    BMI 75.17    Systolic 001  749  Diastolic 89  86  Pulse 449 92 85     Physical Exam Vitals reviewed.  Neck:     Vascular: No carotid bruit.  Cardiovascular:     Rate and Rhythm: Normal rate and regular rhythm.     Pulses: Normal pulses and intact distal pulses.     Heart sounds: S1 normal and S2 normal. No murmur heard.    Gallop present. S4 sounds present.     Comments: No JVD. Pulmonary:     Effort: Pulmonary effort is normal. No respiratory distress.     Breath sounds: Normal breath sounds. No wheezing, rhonchi or rales.  Musculoskeletal:     Right lower leg: No edema.     Left lower leg: No edema.    Laboratory examination:   Recent Labs    07/17/21 1812 07/17/21 1941 09/28/21 2012 10/17/21 1700  NA 136  --  137 139  K 5.9* 5.0 3.6 3.9  CL 100  --  95* 100  CO2 20*  --  28 25  GLUCOSE 337*  --  146* 193*  BUN 67*  --  28* 45*  CREATININE 4.53*  --  2.52* 5.11*  CALCIUM 9.4  --  8.4* 10.0  GFRNONAA 12*  --  24* 10*   estimated creatinine clearance is 15.8 mL/min (A) (by C-G formula based on SCr of 5.11 mg/dL (H)).     Latest Ref Rng & Units 10/17/2021    5:00 PM 09/28/2021    8:12 PM 07/17/2021    7:41 PM  CMP  Glucose 70 - 99 mg/dL 193  146    BUN 6 - 20 mg/dL 45  28    Creatinine 0.44 - 1.00 mg/dL 5.11  2.52    Sodium 135 - 145 mmol/L 139  137     Potassium 3.5 - 5.1 mmol/L 3.9  3.6  5.0   Chloride 98 - 111 mmol/L 100  95    CO2 22 - 32 mmol/L 25  28    Calcium 8.9 - 10.3 mg/dL 10.0  8.4    Total Protein 6.5 - 8.1 g/dL 8.4     Total Bilirubin 0.3 - 1.2 mg/dL 0.6     Alkaline Phos 38 - 126 U/L 118     AST 15 - 41 U/L 11     ALT 0 - 44 U/L 15         Latest Ref Rng & Units 10/17/2021    5:00 PM 09/28/2021    8:12 PM 07/17/2021    6:12 PM  CBC  WBC 4.0 - 10.5 K/uL 8.2  10.0  7.8   Hemoglobin 12.0 - 15.0 g/dL 13.4  11.0  8.6   Hematocrit 36.0 - 46.0 % 39.2  31.7  26.7   Platelets 150 - 400 K/uL 277  258  230     Lipid Panel No results for input(s): "CHOL", "TRIG", "LDLCALC", "VLDL", "HDL", "CHOLHDL", "LDLDIRECT" in the last 8760 hours.   HEMOGLOBIN A1C Lab Results  Component Value Date   HGBA1C 6.8 (H) 07/11/2020   MPG 148.46 07/11/2020   TSH No results for input(s): "TSH" in the last 8760 hours.   External labs:   Chest x-ray PA and lateral view 07/04/2020: Normal chest x-ray.  D-dimer 07/04/2020: 290 (<500).  Labs 07/07/2020:  BNP normal at 22.4.  Serum glucose 191 mg, BUN 63, creatinine 2.80, EGFR 21 mL, potassium 5.5.  Hb 10.8/HCT 31.0, platelets 249.  Iron studies: TIBC 474, elevated.  Iron saturation markedly reduced at 11.  Labs 11/08/2019:  Total cholesterol 477, triglycerides 4259, HDL 31, LDL not calculated.  Labs 10/28/2019: TSH minimally elevated at 4.680.  Free T4 normal.  Allergies   Allergies  Allergen Reactions   Fish Oil Anaphylaxis    Facial edema, itching   Iodinated Contrast Media Anaphylaxis    'cant breathe'   Other Itching, Swelling, Rash and Anaphylaxis    Unable to take due to renal insufficency Unable to ttake due to renal insufficency Eyes swell 'cant breathe' 'cant breathe' 'cant breathe' 'cant breathe' Other reaction(s): Other (See Comments) Patient stated she can not take because she has renal insufficiency Unable to ttake due to renal insufficency 'cant  breathe' Other reaction(s): Other (See Comments) [place] Food   Phenylephrine-Guaifenesin Shortness Of Breath and Swelling    Throat swelling   Propoxyphene Anaphylaxis   Hydromorphone Hcl Itching   Abilify [Aripiprazole] Nausea And Vomiting   Bee Venom Itching and Swelling    Facial swelling   Cephalexin    Dhea [Nutritional Supplements] Nausea And Vomiting and Swelling   Dihydroergotamine Nausea And Vomiting   Divalproex Sodium Nausea And Vomiting and Other (See Comments)    Weight gain   Doxycycline Nausea And Vomiting   Erythromycin Nausea And Vomiting   Indomethacin Nausea And Vomiting    Renal function   Prednisone    Risperidone And Related Other (See Comments)    Out of body feeling   Shellfish Allergy Itching and Swelling    Eyes/throat/facial swelling   Statins Other (See Comments)    Cannot tolerate (weight gain)   Tape Other (See Comments)    Redness/ please use paper tape   Dihydroergotamine Nausea And Vomiting   Lactose Intolerance (Gi) Nausea And Vomiting   Nsaids Other (See Comments)    Unable to ttake due to renal insufficency   Sulfa Antibiotics Itching    Redness/ please use paper tape Redness, can use paper tape    Medications Prior to Visit:  Outpatient Medications Prior to Visit  Medication Sig Dispense Refill   acetaminophen (TYLENOL) 500 MG tablet Take 1,000-2,500 mg by mouth See admin instructions. Take 2 tablets (1000 mg) by mouth in the morning, and take 5 tablets (2500 mg) by mouth at bedtime     albuterol (VENTOLIN HFA) 108 (90 Base) MCG/ACT inhaler Inhale 1 puff into the lungs every 6 (six) hours as needed for wheezing or shortness of breath. 8 g 0   allopurinol (ZYLOPRIM) 300 MG tablet Take 300 mg by mouth at bedtime.     CINNAMON PO Take 2,000 mg by mouth in the morning and at bedtime.     cyclobenzaprine (FLEXERIL) 5 MG tablet Take 5 mg by mouth in the morning, at noon, and at bedtime.     docusate sodium (COLACE) 100 MG capsule Take 200  mg by mouth 2 (two) times daily.     ELDERBERRY PO Take 150 mg by mouth in the morning.     estradiol (CLIMARA - DOSED IN MG/24 HR) 0.05 mg/24hr patch Place 0.05 mg onto the skin every Sunday.     ferrous sulfate 325 (65 FE) MG tablet Take 325 mg by mouth in the morning.     fluticasone (FLONASE) 50 MCG/ACT nasal spray Place 1 spray into both nostrils daily as needed for allergies or rhinitis.     furosemide (LASIX) 40 MG tablet Take 1 tablet (40 mg total) by mouth daily. (Patient taking differently: Take 40 mg by mouth See admin instructions. Take 2 tablets (80 mg) by mouth in the morning & take 1 tablet (40 mg) by mouth at night.) 15 tablet 0   gemfibrozil (LOPID) 600 MG tablet Take 300-600 mg by mouth See admin instructions. Take 1 tablet (600 mg) by mouth in the morning and 0.5 tablet (300 mg) by mouth in the evening     HYDROcodone-acetaminophen (NORCO/VICODIN) 5-325 MG tablet Take 1 tablet by mouth every 6 (six) hours as needed for moderate pain or severe pain. (Patient taking differently: Take 1 tablet by mouth in the morning, at noon, in the evening, and at bedtime.) 20 tablet 0   Insulin Aspart Prot & Aspart (NOVOLOG MIX 70/30 FLEXPEN [place]) Inject 25-45 Units into the skin See admin instructions. Inject 25 units subcutaneously in the morning & inject 45 units subcutaneously in the evening.     levothyroxine (SYNTHROID) 175 MCG tablet Take 175 mcg by mouth daily before breakfast.     lurasidone (LATUDA) 20 MG TABS tablet Take 20 mg by mouth at bedtime.     meclizine (ANTIVERT) 25 MG tablet Take 25 mg by mouth 3 (three) times daily as needed for dizziness.     Melatonin 5 MG TABS Take 10 mg by mouth at bedtime.     mupirocin ointment (BACTROBAN) 2 % Apply 1 Application topically 2 (two) times daily. To affected area till better 22 g 0   ondansetron (ZOFRAN-ODT) 8 MG disintegrating tablet Take 8 mg by mouth every 8 (eight) hours as needed for nausea or vomiting.     pantoprazole (PROTONIX) 40 MG  tablet Take 40 mg by mouth in the morning.     promethazine (PHENERGAN) 25 MG tablet Take 25 mg by mouth every 8 (eight) hours as needed for nausea or vomiting.     sertraline (ZOLOFT) 50 MG tablet Take 50 mg by mouth every evening.     triamcinolone cream (KENALOG) 0.1 % Apply 1 application topically 2 (two) times daily as needed (skin irritation).  vitamin C (ASCORBIC ACID) 500 MG tablet Take 500 mg by mouth in the morning.     Vitamin D, Ergocalciferol, (DRISDOL) 1.25 MG (50000 UNIT) CAPS capsule Take 50,000 Units by mouth every Monday.     carvedilol (COREG) 12.5 MG tablet Take 12.5 mg by mouth in the morning and at bedtime.     amLODipine (NORVASC) 5 MG tablet Take 1 tablet (5 mg total) by mouth daily. 30 tablet 3   aspirin EC 81 MG tablet Take 81 mg by mouth in the morning.     cephALEXin (KEFLEX) 500 MG capsule Take 1 capsule (500 mg total) by mouth 4 (four) times daily. 40 capsule 0   glimepiride (AMARYL) 4 MG tablet Take 4 mg by mouth in the morning.     INVOKANA 300 MG TABS tablet Take 300 mg by mouth every morning.     magnesium oxide (MAG-OX) 400 (240 Mg) MG tablet Take 400 mg by mouth 2 (two) times daily.     Multiple Vitamins-Minerals (ADULT ONE DAILY GUMMIES PO) Take 1 tablet by mouth in the morning. Centrum for Women 50+ (Gummy)     polyethylene glycol (MIRALAX / GLYCOLAX) 17 g packet Take 17 g by mouth daily. (Patient taking differently: Take 17 g by mouth daily as needed (constipation).) 14 each 0   pregabalin (LYRICA) 150 MG capsule Take 1 capsule (150 mg total) by mouth daily. (Patient taking differently: Take 150 mg by mouth in the morning, at noon, and at bedtime.)     sodium bicarbonate 650 MG tablet Take 1 tablet (650 mg total) by mouth 3 (three) times daily. (Patient taking differently: Take 1,300 mg by mouth in the morning and at bedtime.) 30 tablet 0   No facility-administered medications prior to visit.   Final Medications at End of Visit    Current Meds   Medication Sig   acetaminophen (TYLENOL) 500 MG tablet Take 1,000-2,500 mg by mouth See admin instructions. Take 2 tablets (1000 mg) by mouth in the morning, and take 5 tablets (2500 mg) by mouth at bedtime   albuterol (VENTOLIN HFA) 108 (90 Base) MCG/ACT inhaler Inhale 1 puff into the lungs every 6 (six) hours as needed for wheezing or shortness of breath.   allopurinol (ZYLOPRIM) 300 MG tablet Take 300 mg by mouth at bedtime.   CINNAMON PO Take 2,000 mg by mouth in the morning and at bedtime.   cyclobenzaprine (FLEXERIL) 5 MG tablet Take 5 mg by mouth in the morning, at noon, and at bedtime.   docusate sodium (COLACE) 100 MG capsule Take 200 mg by mouth 2 (two) times daily.   ELDERBERRY PO Take 150 mg by mouth in the morning.   estradiol (CLIMARA - DOSED IN MG/24 HR) 0.05 mg/24hr patch Place 0.05 mg onto the skin every Sunday.   ferrous sulfate 325 (65 FE) MG tablet Take 325 mg by mouth in the morning.   fluticasone (FLONASE) 50 MCG/ACT nasal spray Place 1 spray into both nostrils daily as needed for allergies or rhinitis.   furosemide (LASIX) 40 MG tablet Take 1 tablet (40 mg total) by mouth daily. (Patient taking differently: Take 40 mg by mouth See admin instructions. Take 2 tablets (80 mg) by mouth in the morning & take 1 tablet (40 mg) by mouth at night.)   gemfibrozil (LOPID) 600 MG tablet Take 300-600 mg by mouth See admin instructions. Take 1 tablet (600 mg) by mouth in the morning and 0.5 tablet (300 mg) by mouth in the  evening   HYDROcodone-acetaminophen (NORCO/VICODIN) 5-325 MG tablet Take 1 tablet by mouth every 6 (six) hours as needed for moderate pain or severe pain. (Patient taking differently: Take 1 tablet by mouth in the morning, at noon, in the evening, and at bedtime.)   Insulin Aspart Prot & Aspart (NOVOLOG MIX 70/30 FLEXPEN [place]) Inject 25-45 Units into the skin See admin instructions. Inject 25 units subcutaneously in the morning & inject 45 units subcutaneously in the evening.    levothyroxine (SYNTHROID) 175 MCG tablet Take 175 mcg by mouth daily before breakfast.   lurasidone (LATUDA) 20 MG TABS tablet Take 20 mg by mouth at bedtime.   meclizine (ANTIVERT) 25 MG tablet Take 25 mg by mouth 3 (three) times daily as needed for dizziness.   Melatonin 5 MG TABS Take 10 mg by mouth at bedtime.   mupirocin ointment (BACTROBAN) 2 % Apply 1 Application topically 2 (two) times daily. To affected area till better   ondansetron (ZOFRAN-ODT) 8 MG disintegrating tablet Take 8 mg by mouth every 8 (eight) hours as needed for nausea or vomiting.   pantoprazole (PROTONIX) 40 MG tablet Take 40 mg by mouth in the morning.   promethazine (PHENERGAN) 25 MG tablet Take 25 mg by mouth every 8 (eight) hours as needed for nausea or vomiting.   sertraline (ZOLOFT) 50 MG tablet Take 50 mg by mouth every evening.   triamcinolone cream (KENALOG) 0.1 % Apply 1 application topically 2 (two) times daily as needed (skin irritation).   vitamin C (ASCORBIC ACID) 500 MG tablet Take 500 mg by mouth in the morning.   Vitamin D, Ergocalciferol, (DRISDOL) 1.25 MG (50000 UNIT) CAPS capsule Take 50,000 Units by mouth every Monday.   [DISCONTINUED] carvedilol (COREG) 12.5 MG tablet Take 12.5 mg by mouth in the morning and at bedtime.   Radiology:   No results found.  Cardiac Studies:  PCV ECHOCARDIOGRAM COMPLETE 02/19/2021 Left ventricle cavity is normal in size. Mild concentric hypertrophy of the left ventricle. Normal global wall motion. Normal LV systolic function with EF 62%. Indeterminate diastolic filling pattern due to E/A fusion. Structurally normal trileaflet aortic valve. No evidence of aortic stenosis. Mild (Grade I) aortic regurgitation. Mild (Grade I) mitral regurgitation. Mild tricuspid regurgitation. Mild pulmonic regurgitation. No evidence of pulmonary hypertension.   Echocardiogram 09/30/2016: Normal LV size, normal LV systolic function, EF 16-10% without regional wall motion  abnormality.  Stress test- 10/13/12 Negative for ischemia, there was no significant ST.   depression c.f. resting EKG. Walked for 7 minutes.   Peak HR- 174/min ( 93% MPHR), achieved 8.2 METS.  EKG  10/25/2021: Sinus rhythm at a rate of 90 bpm.  Normal axis.  Left atrial enlargement.  Nonspecific T wave abnormality, unchanged compared to previous EKG.   02/13/2021: Sinus rhythm at a rate of 92 bpm.  Left atrial enlargement.  Normal axis.  Poor R wave progression, cannot exclude anteroseptal infarct old.  No evidence of ischemia or underlying injury pattern.  07/10/2020: Sinus tachycardia at rate of 101 bpm, left atrial abnormality, normal axis, incomplete right bundle branch block, poor R wave progression, probably normal variant.  No evidence of ischemia, normal QT interval. No significant change from prior EKG.  09/17/2019: Sinus tachycardia at the rate of 105 bpm, normal axis, poor R wave progression, probably normal variant.  No evidence of ischemia.  No significant change from prior EKG.  Assessment     ICD-10-CM   1. Primary hypertension  I10 EKG 12-Lead  2. Stage 3 chronic kidney disease, unspecified whether stage 3a or 3b CKD (HCC)  N18.30        Medications Discontinued During This Encounter  Medication Reason   aspirin EC 81 MG tablet    cephALEXin (KEFLEX) 500 MG capsule    glimepiride (AMARYL) 4 MG tablet    INVOKANA 300 MG TABS tablet    magnesium oxide (MAG-OX) 400 (240 Mg) MG tablet    Multiple Vitamins-Minerals (ADULT ONE DAILY GUMMIES PO)    polyethylene glycol (MIRALAX / GLYCOLAX) 17 g packet    pregabalin (LYRICA) 150 MG capsule    sodium bicarbonate 650 MG tablet    amLODipine (NORVASC) 5 MG tablet Patient has not taken in last 30 days   carvedilol (COREG) 12.5 MG tablet Reorder     Meds ordered this encounter  Medications   carvedilol (COREG) 6.25 MG tablet    Sig: Take 1 tablet (6.25 mg total) by mouth in the morning and at bedtime.    Dispense:  60 tablet     Refill:  3   Orders Placed This Encounter  Procedures   EKG 12-Lead    Recommendations:   Brandi Love is a 46 y.o. eating disorder,  depression with psychosis, borderline personality disorder, OCD, hypothyroidism, deafness with cochlear implants, and chronic kidney disease stage III, and is attributed to NSAID use and also patient once try to commit suicide by consuming excessive amounts of aspirin in the remote past, DM.  She is being followed at [facility] for familial hypertriglyceridemia.   Patient was last seen in the office 04/25/2021 at which time patient was stable from a cardiac standpoint, therefore no changes were made. She now presents for 6 month follow up.  Patient is doing well overall from a cardiovascular standpoint and remains fairly asymptomatic.  We will resume carvedilol at 6.25 mg p.o. twice daily instead of 12.5 mg.  She is preparing to move out of the city and will establish with new cardiologist after today's appointment.  Follow-up as needed.   Alethia Berthold, PA-C 10/25/2021, 1:20 PM Office: [phone]

## 2021-11-13 ENCOUNTER — Telehealth: Payer: Self-pay

## 2021-11-13 NOTE — Telephone Encounter (Addendum)
Pt called via interpreter to report that she is no longer able to receive HD treatment d/t her fistula being too deep to access anymore. Unclear why fistula was being used, pt kept saying that she needed a stent. Pt has no other access at this time. Reviewed chart and referral. Appt made for [place] and PA on 7/28.

## 2021-11-14 ENCOUNTER — Other Ambulatory Visit: Payer: Self-pay

## 2021-11-14 DIAGNOSIS — N186 End stage renal disease: Secondary | ICD-10-CM

## 2021-11-15 NOTE — Progress Notes (Signed)
Established Dialysis Access   History of Present Illness   Brandi Love is a 46 y.o. (11-01-1975) female who presents for follow up of right brachiocephalic AV fistula. Fistula was created on 05/04/21 by Dr. Donzetta Matters.  She was sent over due to difficulty with cannulation and concern that fistula is too deep in the right upper arm for cannulation.   Patient presents with her dad and sign language interpreter. She explains that since fistula was placed and more so recently they have had issues with cannulation of the fistula due to its depth. Also at her last dialysis session on Wednesday she got bad cramps in her legs and jumped off the table which pulled her dialysis needle out of her arm so she has had bruising and soreness since. She does report however once she is on machine her fistula works okay. She does report that she had a Angiogram by Dr. Augustin Coupe and he told her that her veins are small and that he had to angioplasty one of her veins and that it would likely need stenting in the future. She does not have any pain in her arm or hand otherwise. She now is living in [place] with her parents.   She currently dialyzes on MWF at a dialysis center in [place]  Current Outpatient Medications  Medication Sig Dispense Refill   acetaminophen (TYLENOL) 500 MG tablet Take 1,000-2,500 mg by mouth See admin instructions. Take 2 tablets (1000 mg) by mouth in the morning, and take 5 tablets (2500 mg) by mouth at bedtime     albuterol (VENTOLIN HFA) 108 (90 Base) MCG/ACT inhaler Inhale 1 puff into the lungs every 6 (six) hours as needed for wheezing or shortness of breath. 8 g 0   allopurinol (ZYLOPRIM) 300 MG tablet Take 300 mg by mouth at bedtime.     carvedilol (COREG) 6.25 MG tablet Take 1 tablet (6.25 mg total) by mouth in the morning and at bedtime. 60 tablet 3   CINNAMON PO Take 2,000 mg by mouth in the morning and at bedtime.     cyclobenzaprine (FLEXERIL) 5 MG tablet Take 5 mg by mouth in  the morning, at noon, and at bedtime.     docusate sodium (COLACE) 100 MG capsule Take 200 mg by mouth 2 (two) times daily.     ELDERBERRY PO Take 150 mg by mouth in the morning.     estradiol (CLIMARA - DOSED IN MG/24 HR) 0.05 mg/24hr patch Place 0.05 mg onto the skin every Sunday.     ferrous sulfate 325 (65 FE) MG tablet Take 325 mg by mouth in the morning.     fluticasone (FLONASE) 50 MCG/ACT nasal spray Place 1 spray into both nostrils daily as needed for allergies or rhinitis.     furosemide (LASIX) 40 MG tablet Take 1 tablet (40 mg total) by mouth daily. (Patient taking differently: Take 40 mg by mouth See admin instructions. Take 2 tablets (80 mg) by mouth in the morning & take 1 tablet (40 mg) by mouth at night.) 15 tablet 0   gemfibrozil (LOPID) 600 MG tablet Take 300-600 mg by mouth See admin instructions. Take 1 tablet (600 mg) by mouth in the morning and 0.5 tablet (300 mg) by mouth in the evening     HYDROcodone-acetaminophen (NORCO/VICODIN) 5-325 MG tablet Take 1 tablet by mouth every 6 (six) hours as needed for moderate pain or severe pain. (Patient taking differently: Take 1 tablet by mouth in the morning,  at noon, in the evening, and at bedtime.) 20 tablet 0   Insulin Aspart Prot & Aspart (NOVOLOG MIX 70/30 FLEXPEN [place]) Inject 25-45 Units into the skin See admin instructions. Inject 25 units subcutaneously in the morning & inject 45 units subcutaneously in the evening.     levothyroxine (SYNTHROID) 175 MCG tablet Take 175 mcg by mouth daily before breakfast.     lurasidone (LATUDA) 20 MG TABS tablet Take 20 mg by mouth at bedtime.     meclizine (ANTIVERT) 25 MG tablet Take 25 mg by mouth 3 (three) times daily as needed for dizziness.     Melatonin 5 MG TABS Take 10 mg by mouth at bedtime.     mupirocin ointment (BACTROBAN) 2 % Apply 1 Application topically 2 (two) times daily. To affected area till better 22 g 0   ondansetron (ZOFRAN-ODT) 8 MG disintegrating tablet Take 8 mg by mouth  every 8 (eight) hours as needed for nausea or vomiting.     pantoprazole (PROTONIX) 40 MG tablet Take 40 mg by mouth in the morning.     promethazine (PHENERGAN) 25 MG tablet Take 25 mg by mouth every 8 (eight) hours as needed for nausea or vomiting.     sertraline (ZOLOFT) 50 MG tablet Take 50 mg by mouth every evening.     triamcinolone cream (KENALOG) 0.1 % Apply 1 application topically 2 (two) times daily as needed (skin irritation).     vitamin C (ASCORBIC ACID) 500 MG tablet Take 500 mg by mouth in the morning.     Vitamin D, Ergocalciferol, (DRISDOL) 1.25 MG (50000 UNIT) CAPS capsule Take 50,000 Units by mouth every Monday.     No current facility-administered medications for this visit.    On ROS today: negative unless stated in HPI   Physical Examination   Vitals:   11/16/21 0920  BP: 128/88  Pulse: 97  Resp: 16  Temp: 97.6 F (36.4 C)  TempSrc: Temporal  SpO2: 96%  Weight: 173 lb (78.5 kg)  Height: 5\' 8"  (1.727 m)   Body mass index is 26.3 kg/m.  General Pleasant, well appearing, in no acute distress  Pulmonary Non labored  Cardiac Regular rate and rhythm  Vascular 2+ right radial pulse. Right AV fistula with good thrill. Bruit present on auscultation. Fistula appears likely that it was slightly infiltrated with some ecchymosis present. Fistula is somewhat deep in right upper arm  Musculo- skeletal M/S 5/5 throughout  , Extremities without ischemic changes    Neurologic A&O; CN grossly intact     Non-invasive Vascular Imaging    Findings:  +--------------------+----------+-----------------+--------+  AVF                 PSV (cm/s)Flow Vol (mL/min)Comments  +--------------------+----------+-----------------+--------+  Native artery inflow   235          2845                 +--------------------+----------+-----------------+--------+  AVF Anastomosis        751                                +--------------------+----------+-----------------+--------+     +------------+----------+-------------+----------+----------------+  OUTFLOW VEINPSV (cm/s)Diameter (cm)Depth (cm)    Describe      +------------+----------+-------------+----------+----------------+  Shoulder       317        0.63        1.26                     +------------+----------+-------------+----------+----------------+  Prox UA        194        0.97        0.98   competing branch  +------------+----------+-------------+----------+----------------+  Mid UA         221        1.10        0.68       thrombus      +------------+----------+-------------+----------+----------------+  Dist UA        222        1.90        0.21   competing branch  +------------+----------+-------------+----------+----------------+  AC Fossa       626        0.47        0.48                     +------------+----------+-------------+----------+----------------+   Summary:  Patent AV fistula. Mobile-appearing thrombus in the outflow vein at the mid upper arm level.    Medical Decision Making   Brandi Love is a 46 y.o. female who presents with concerns about depth and issues cannulating her right Brachiocephalic AV fistula. Her fistula is patent and functioning well. Duplex today shows some competing branches and thrombus in mid upper arm. Overall depth on duplex appears okay but clinically it is slightly deeper in upper arm. I think she would benefit from ligation of her side branches as well as transposition. I discussed that she would need TDC in the interim to heal. Also recommend Fistulogram at time of surgery to evaluation patency of fistula especially with thrombus present. She can continue to use fistula in the mean time. It appears that the fistula has been stuck right next to anastamosis. Would recommend avoiding this area and sticking higher in fistula if possible. Discussed risks/ benefits/  alternatives of surgery with patient and her father. Their questions were answered. I will try to arrange this hybrid case with Dr. Donzetta Matters in the near future. She dialyzes on MWF so will try to schedule this on a non dialysis day.    Karoline Caldwell, PA-C [facility] Office: [phone]  Clinic MD: Virl Cagey

## 2021-11-16 ENCOUNTER — Ambulatory Visit (HOSPITAL_COMMUNITY)
Admission: RE | Admit: 2021-11-16 | Discharge: 2021-11-16 | Disposition: A | Discharge status: Home / Self Care | Payer: Medicare Other | Source: Ambulatory Visit | Attending: Vascular Surgery | Admitting: Vascular Surgery

## 2021-11-16 ENCOUNTER — Ambulatory Visit (INDEPENDENT_AMBULATORY_CARE_PROVIDER_SITE_OTHER): Payer: Medicare Other | Admitting: Physician Assistant

## 2021-11-16 VITALS — BP 128/88 | HR 97 | Temp 97.6°F | Resp 16 | Ht 68.0 in | Wt 173.0 lb

## 2021-11-16 DIAGNOSIS — Z992 Dependence on renal dialysis: Secondary | ICD-10-CM | POA: Insufficient documentation

## 2021-11-16 DIAGNOSIS — N186 End stage renal disease: Secondary | ICD-10-CM

## 2021-11-23 ENCOUNTER — Other Ambulatory Visit: Payer: Self-pay

## 2021-11-23 DIAGNOSIS — N186 End stage renal disease: Secondary | ICD-10-CM

## 2021-11-23 DIAGNOSIS — Z992 Dependence on renal dialysis: Secondary | ICD-10-CM

## 2021-11-26 ENCOUNTER — Encounter (HOSPITAL_COMMUNITY): Payer: Self-pay | Admitting: Vascular Surgery

## 2021-11-26 NOTE — Progress Notes (Signed)
PCP - Bernerd Limbo, MD Cardiologist -  EKG - 10/25/21 Chest x-ray - 09/28/21 ECHO - 01/22/21 Cardiac Cath -  CPAP -  Fasting Blood Sugar:  100-150 Checks Blood Sugar:  4x/day Blood Thinner Instructions:  Aspirin Instructions:  ERAS Protcol -  COVID TEST- n/a  Anesthesia review: yes  -------------  SDW INSTRUCTIONS:  Your procedure is scheduled on 8/8. Please report to [facility] Main Entrance "A" at 0900 A.M., and check in at the Admitting office. Call this number if you have problems the morning of surgery: [phone]   Remember: Do not eat or drink after midnight the night before your surgery   Medications to take morning of surgery with a sip of water include: acetaminophen (TYLENOL) if needed albuterol (VENTOLIN HFA) if needed (Please bring all inhalers with you the day of surgery) carvedilol (COREG) cyclobenzaprine (FLEXERIL)  fluticasone (FLONASE)  if needed gemfibrozil (LOPID)  levothyroxine (SYNTHROID)  lurasidone (LATUDA)  pantoprazole (PROTONIX) promethazine (PHENERGAN) if needed HYDROcodone-acetaminophen (NORCO/VICODIN) if needed  ** PLEASE check your blood sugar the morning of your surgery when you wake up and every 2 hours until you get to the Short Stay unit.  If your blood sugar is less than 70 mg/dL, you will need to treat for low blood sugar: Do not take insulin. Treat a low blood sugar (less than 70 mg/dL) with  cup of clear juice (cranberry or apple), 4 glucose tablets, OR glucose gel. Recheck blood sugar in 15 minutes after treatment (to make sure it is greater than 70 mg/dL). If your blood sugar is not greater than 70 mg/dL on recheck, call [phone] for further instructions.  insulin aspart (NOVOLOG)  8/7: take usual AM dose; no PM dose 8/8: none; if CBG >220, take half usual dose  insulin detemir (LEVEMIR FLEXPEN)  8/7: take usual AM dose; half PM dose (10 units) 8/8: none (pt usually takes only PM dose)*    As of today, STOP taking any  Aspirin (unless otherwise instructed by your surgeon), Aleve, Naproxen, Ibuprofen, Motrin, Advil, Goody's, BC's, all herbal medications, fish oil, and all vitamins.    The Morning of Surgery Do not wear jewelry, make-up or nail polish. Do not wear lotions, powders, or perfumes, or deodorant Do not bring valuables to the hospital. [facility] is not responsible for any belongings or valuables.  If you are a smoker, DO NOT Smoke 24 hours prior to surgery  If you wear a CPAP at night please bring your mask the morning of surgery   Remember that you must have someone to transport you home after your surgery, and remain with you for 24 hours if you are discharged the same day.  Please bring cases for contacts, glasses, hearing aids, dentures or bridgework because it cannot be worn into surgery.   Patients discharged the day of surgery will not be allowed to drive home.   Please shower the NIGHT BEFORE/MORNING OF SURGERY (use antibacterial soap like DIAL soap if possible). Wear comfortable clothes the morning of surgery. Oral Hygiene is also important to reduce your risk of infection.  Remember - BRUSH YOUR TEETH THE MORNING OF SURGERY WITH YOUR REGULAR TOOTHPASTE  Patient denies shortness of breath, fever, cough and chest pain.

## 2021-11-27 ENCOUNTER — Ambulatory Visit (HOSPITAL_COMMUNITY): Payer: Medicare Other | Admitting: Physician Assistant

## 2021-11-27 ENCOUNTER — Ambulatory Visit (HOSPITAL_BASED_OUTPATIENT_CLINIC_OR_DEPARTMENT_OTHER): Payer: Medicare Other | Admitting: Physician Assistant

## 2021-11-27 ENCOUNTER — Other Ambulatory Visit: Payer: Self-pay

## 2021-11-27 ENCOUNTER — Ambulatory Visit (HOSPITAL_COMMUNITY): Payer: Medicare Other

## 2021-11-27 ENCOUNTER — Encounter (HOSPITAL_COMMUNITY)
Admission: RE | Disposition: A | Discharge status: Home / Self Care | Payer: Self-pay | Source: Home / Self Care | Attending: Vascular Surgery

## 2021-11-27 ENCOUNTER — Encounter (HOSPITAL_COMMUNITY): Payer: Self-pay | Admitting: Vascular Surgery

## 2021-11-27 ENCOUNTER — Ambulatory Visit (HOSPITAL_COMMUNITY)
Admission: RE | Admit: 2021-11-27 | Discharge: 2021-11-27 | Disposition: A | Discharge status: Home / Self Care | Payer: Medicare Other | Attending: Vascular Surgery | Admitting: Vascular Surgery

## 2021-11-27 DIAGNOSIS — T82858A Stenosis of vascular prosthetic devices, implants and grafts, initial encounter: Secondary | ICD-10-CM | POA: Diagnosis present

## 2021-11-27 DIAGNOSIS — Y841 Kidney dialysis as the cause of abnormal reaction of the patient, or of later complication, without mention of misadventure at the time of the procedure: Secondary | ICD-10-CM | POA: Diagnosis not present

## 2021-11-27 DIAGNOSIS — Z992 Dependence on renal dialysis: Secondary | ICD-10-CM | POA: Insufficient documentation

## 2021-11-27 DIAGNOSIS — N186 End stage renal disease: Secondary | ICD-10-CM | POA: Insufficient documentation

## 2021-11-27 DIAGNOSIS — K219 Gastro-esophageal reflux disease without esophagitis: Secondary | ICD-10-CM | POA: Diagnosis not present

## 2021-11-27 DIAGNOSIS — E039 Hypothyroidism, unspecified: Secondary | ICD-10-CM | POA: Insufficient documentation

## 2021-11-27 DIAGNOSIS — E1122 Type 2 diabetes mellitus with diabetic chronic kidney disease: Secondary | ICD-10-CM | POA: Insufficient documentation

## 2021-11-27 DIAGNOSIS — I120 Hypertensive chronic kidney disease with stage 5 chronic kidney disease or end stage renal disease: Secondary | ICD-10-CM

## 2021-11-27 DIAGNOSIS — Z794 Long term (current) use of insulin: Secondary | ICD-10-CM | POA: Diagnosis not present

## 2021-11-27 HISTORY — PX: PATCH ANGIOPLASTY: SHX6230

## 2021-11-27 HISTORY — PX: INSERTION OF DIALYSIS CATHETER: SHX1324

## 2021-11-27 HISTORY — PX: BASCILIC VEIN TRANSPOSITION: SHX5742

## 2021-11-27 HISTORY — PX: LIGATION OF COMPETING BRANCHES OF ARTERIOVENOUS FISTULA: SHX5949

## 2021-11-27 LAB — POCT I-STAT, CHEM 8
BUN: 31 mg/dL — ABNORMAL HIGH (ref 6–20)
Calcium, Ion: 1.26 mmol/L (ref 1.15–1.40)
Chloride: 105 mmol/L (ref 98–111)
Creatinine, Ser: 5 mg/dL — ABNORMAL HIGH (ref 0.44–1.00)
Glucose, Bld: 133 mg/dL — ABNORMAL HIGH (ref 70–99)
HCT: 34 % — ABNORMAL LOW (ref 36.0–46.0)
Hemoglobin: 11.6 g/dL — ABNORMAL LOW (ref 12.0–15.0)
Potassium: 5 mmol/L (ref 3.5–5.1)
Sodium: 135 mmol/L (ref 135–145)
TCO2: 20 mmol/L — ABNORMAL LOW (ref 22–32)

## 2021-11-27 LAB — GLUCOSE, CAPILLARY
Glucose-Capillary: 158 mg/dL — ABNORMAL HIGH (ref 70–99)
Glucose-Capillary: 107 mg/dL — ABNORMAL HIGH (ref 70–99)
Glucose-Capillary: 191 mg/dL — ABNORMAL HIGH (ref 70–99)

## 2021-11-27 SURGERY — INSERTION OF DIALYSIS CATHETER
Anesthesia: General | Site: Neck | Laterality: Right

## 2021-11-27 MED ORDER — FENTANYL CITRATE (PF) 250 MCG/5ML IJ SOLN
INTRAMUSCULAR | Status: AC
  Filled 2021-11-27: qty 5

## 2021-11-27 MED ORDER — CHLORHEXIDINE GLUCONATE 0.12 % MT SOLN: 15.0000 mL | Freq: Once | OROMUCOSAL | Status: DC

## 2021-11-27 MED ORDER — ONDANSETRON HCL 4 MG/2ML IJ SOLN
4.0000 mg | Freq: Four times a day (QID) | INTRAMUSCULAR | Status: AC | PRN
  Administered 2021-11-27: 4 mg via INTRAVENOUS

## 2021-11-27 MED ORDER — HEPARIN 6000 UNIT IRRIGATION SOLUTION
Status: AC
  Filled 2021-11-27: qty 500

## 2021-11-27 MED ORDER — CHLORHEXIDINE GLUCONATE 4 % EX LIQD: 60.0000 mL | Freq: Once | CUTANEOUS | Status: DC

## 2021-11-27 MED ORDER — PROPOFOL 10 MG/ML IV BOLUS
INTRAVENOUS | Status: DC | PRN
  Administered 2021-11-27: 200 mg via INTRAVENOUS

## 2021-11-27 MED ORDER — ONDANSETRON HCL 4 MG/2ML IJ SOLN
INTRAMUSCULAR | Status: AC
  Filled 2021-11-27: qty 2

## 2021-11-27 MED ORDER — LIDOCAINE-EPINEPHRINE (PF) 1 %-1:200000 IJ SOLN
INTRAMUSCULAR | Status: AC
  Filled 2021-11-27: qty 30

## 2021-11-27 MED ORDER — ONDANSETRON HCL 4 MG/2ML IJ SOLN
INTRAMUSCULAR | Status: DC | PRN
  Administered 2021-11-27: 4 mg via INTRAVENOUS

## 2021-11-27 MED ORDER — MIDAZOLAM HCL 2 MG/2ML IJ SOLN
INTRAMUSCULAR | Status: DC | PRN
  Administered 2021-11-27: 2 mg via INTRAVENOUS

## 2021-11-27 MED ORDER — PAPAVERINE HCL 30 MG/ML IJ SOLN
INTRAMUSCULAR | Status: AC
  Filled 2021-11-27: qty 2

## 2021-11-27 MED ORDER — VANCOMYCIN HCL IN DEXTROSE 1-5 GM/200ML-% IV SOLN
INTRAVENOUS | Status: AC
  Administered 2021-11-27: 1000 mg via INTRAVENOUS
  Filled 2021-11-27: qty 200

## 2021-11-27 MED ORDER — DEXAMETHASONE SODIUM PHOSPHATE 10 MG/ML IJ SOLN
INTRAMUSCULAR | Status: AC
  Filled 2021-11-27: qty 1

## 2021-11-27 MED ORDER — SODIUM CHLORIDE 0.9 % IV SOLN: INTRAVENOUS | Status: DC

## 2021-11-27 MED ORDER — INSULIN ASPART 100 UNIT/ML IJ SOLN: 0.0000 [IU] | INTRAMUSCULAR | Status: DC | PRN

## 2021-11-27 MED ORDER — PROPOFOL 500 MG/50ML IV EMUL
INTRAVENOUS | Status: DC | PRN
  Administered 2021-11-27: 200 ug/kg/min via INTRAVENOUS

## 2021-11-27 MED ORDER — PROPOFOL 10 MG/ML IV BOLUS
INTRAVENOUS | Status: AC
  Filled 2021-11-27: qty 20

## 2021-11-27 MED ORDER — HEPARIN SODIUM (PORCINE) 1000 UNIT/ML IJ SOLN
INTRAMUSCULAR | Status: AC
  Filled 2021-11-27: qty 10

## 2021-11-27 MED ORDER — MIDAZOLAM HCL 2 MG/2ML IJ SOLN
INTRAMUSCULAR | Status: AC
  Filled 2021-11-27: qty 2

## 2021-11-27 MED ORDER — FENTANYL CITRATE (PF) 100 MCG/2ML IJ SOLN: 25.0000 ug | INTRAMUSCULAR | Status: DC | PRN

## 2021-11-27 MED ORDER — ORAL CARE MOUTH RINSE: 15.0000 mL | Freq: Once | OROMUCOSAL | Status: DC

## 2021-11-27 MED ORDER — LIDOCAINE-EPINEPHRINE (PF) 1 %-1:200000 IJ SOLN
INTRAMUSCULAR | Status: DC | PRN
  Administered 2021-11-27: 26 mL

## 2021-11-27 MED ORDER — 0.9 % SODIUM CHLORIDE (POUR BTL) OPTIME
TOPICAL | Status: DC | PRN
  Administered 2021-11-27: 1000 mL

## 2021-11-27 MED ORDER — FENTANYL CITRATE (PF) 250 MCG/5ML IJ SOLN
INTRAMUSCULAR | Status: DC | PRN
  Administered 2021-11-27: 100 ug via INTRAVENOUS
  Administered 2021-11-27: 50 ug via INTRAVENOUS
  Administered 2021-11-27: 100 ug via INTRAVENOUS
  Administered 2021-11-27 (×2): 50 ug via INTRAVENOUS

## 2021-11-27 MED ORDER — HEPARIN SODIUM (PORCINE) 1000 UNIT/ML IJ SOLN
INTRAMUSCULAR | Status: DC | PRN
  Administered 2021-11-27: 4.2 [IU] via INTRAVENOUS

## 2021-11-27 MED ORDER — LIDOCAINE HCL (PF) 1 % IJ SOLN
INTRAMUSCULAR | Status: AC
  Filled 2021-11-27: qty 30

## 2021-11-27 MED ORDER — DIPHENHYDRAMINE HCL 50 MG/ML IJ SOLN
INTRAMUSCULAR | Status: DC | PRN
  Administered 2021-11-27: 50 mg via INTRAVENOUS

## 2021-11-27 MED ORDER — CHLORHEXIDINE GLUCONATE 0.12 % MT SOLN
OROMUCOSAL | Status: DC
Start: 2021-11-27 — End: 2021-11-27
  Filled 2021-11-27: qty 15

## 2021-11-27 MED ORDER — HEPARIN 6000 UNIT IRRIGATION SOLUTION
Status: DC | PRN
  Administered 2021-11-27: 1

## 2021-11-27 MED ORDER — DEXAMETHASONE SODIUM PHOSPHATE 10 MG/ML IJ SOLN
INTRAMUSCULAR | Status: DC | PRN
  Administered 2021-11-27: 10 mg via INTRAVENOUS

## 2021-11-27 MED ORDER — OXYCODONE HCL 5 MG PO TABS: 5.0000 mg | ORAL_TABLET | Freq: Once | ORAL | Status: DC | PRN

## 2021-11-27 MED ORDER — OXYCODONE HCL 5 MG/5ML PO SOLN: 5.0000 mg | Freq: Once | ORAL | Status: DC | PRN

## 2021-11-27 MED ORDER — VANCOMYCIN HCL IN DEXTROSE 1-5 GM/200ML-% IV SOLN: 1000.0000 mg | INTRAVENOUS | Status: AC

## 2021-11-27 MED ORDER — HEPARIN SODIUM (PORCINE) 1000 UNIT/ML IJ SOLN
INTRAMUSCULAR | Status: DC | PRN
  Administered 2021-11-27: 8000 [IU] via INTRAVENOUS

## 2021-11-27 MED ORDER — LIDOCAINE 2% (20 MG/ML) 5 ML SYRINGE
INTRAMUSCULAR | Status: AC
  Filled 2021-11-27: qty 5

## 2021-11-27 SURGICAL SUPPLY — 80 items
ADH SKN CLS APL DERMABOND .7 (GAUZE/BANDAGES/DRESSINGS) ×3
ARMBAND PINK RESTRICT EXTREMIT (MISCELLANEOUS) ×5 IMPLANT
BAG BANDED W/RUBBER/TAPE 36X54 (MISCELLANEOUS) ×5 IMPLANT
BAG COUNTER SPONGE SURGICOUNT (BAG) ×5 IMPLANT
BAG DECANTER FOR FLEXI CONT (MISCELLANEOUS) ×5 IMPLANT
BAG EQP BAND 135X91 W/RBR TAPE (MISCELLANEOUS) ×3
BAG SPNG CNTER NS LX DISP (BAG) ×3
BIOPATCH RED 1 DISK 7.0 (GAUZE/BANDAGES/DRESSINGS) ×5 IMPLANT
CANISTER SUCT 3000ML PPV (MISCELLANEOUS) ×5 IMPLANT
CANNULA VESSEL 3MM 2 BLNT TIP (CANNULA) ×1 IMPLANT
CATH PALINDROME-P 19CM W/VT (CATHETERS) IMPLANT
CATH PALINDROME-P 23CM W/VT (CATHETERS) IMPLANT
CATH PALINDROME-P 28CM W/VT (CATHETERS) ×1 IMPLANT
CHLORAPREP W/TINT 10.5 ML (MISCELLANEOUS) ×5 IMPLANT
CLIP LIGATING EXTRA MED SLVR (CLIP) ×5 IMPLANT
CLIP LIGATING EXTRA SM BLUE (MISCELLANEOUS) ×5 IMPLANT
CNTNR URN SCR LID CUP LEK RST (MISCELLANEOUS) IMPLANT
CONT SPEC 4OZ STRL OR WHT (MISCELLANEOUS) ×4
COVER DOME SNAP 22 D (MISCELLANEOUS) ×5 IMPLANT
COVER PROBE W GEL 5X96 (DRAPES) ×5 IMPLANT
COVER SURGICAL LIGHT HANDLE (MISCELLANEOUS) ×5 IMPLANT
DERMABOND ADVANCED (GAUZE/BANDAGES/DRESSINGS) ×4
DERMABOND ADVANCED .7 DNX12 (GAUZE/BANDAGES/DRESSINGS) ×4 IMPLANT
DRAPE BRACHIAL (DRAPES) ×5 IMPLANT
DRAPE C-ARM 42X72 X-RAY (DRAPES) ×5 IMPLANT
DRAPE CHEST BREAST 15X10 FENES (DRAPES) ×5 IMPLANT
DRSG TEGADERM 4X4.75 (GAUZE/BANDAGES/DRESSINGS) ×5 IMPLANT
ELECT REM PT RETURN 9FT ADLT (ELECTROSURGICAL) ×4
ELECTRODE REM PT RTRN 9FT ADLT (ELECTROSURGICAL) ×4 IMPLANT
GAUZE 4X4 16PLY ~~LOC~~+RFID DBL (SPONGE) ×6 IMPLANT
GEL ULTRASOUND 8.5O AQUASONIC (MISCELLANEOUS) ×5 IMPLANT
GLOVE BIO SURGEON STRL SZ7.5 (GLOVE) ×5 IMPLANT
GLOVE BIOGEL PI IND STRL 8 (GLOVE) IMPLANT
GLOVE BIOGEL PI INDICATOR 8 (GLOVE) ×16
GOWN STRL REUS W/ TWL LRG LVL3 (GOWN DISPOSABLE) ×12 IMPLANT
GOWN STRL REUS W/ TWL XL LVL3 (GOWN DISPOSABLE) ×4 IMPLANT
GOWN STRL REUS W/TWL LRG LVL3 (GOWN DISPOSABLE) ×12
GOWN STRL REUS W/TWL XL LVL3 (GOWN DISPOSABLE) ×4
KIT BASIN OR (CUSTOM PROCEDURE TRAY) ×5 IMPLANT
KIT ENCORE 26 ADVANTAGE (KITS) IMPLANT
KIT PALINDROME-P 55CM (CATHETERS) IMPLANT
KIT TURNOVER KIT B (KITS) ×5 IMPLANT
NDL 18GX1X1/2 (RX/OR ONLY) (NEEDLE) ×4 IMPLANT
NDL HYPO 25GX1X1/2 BEV (NEEDLE) ×4 IMPLANT
NDL PERC 18GX7CM (NEEDLE) IMPLANT
NEEDLE 18GX1X1/2 (RX/OR ONLY) (NEEDLE) ×4 IMPLANT
NEEDLE HYPO 25GX1X1/2 BEV (NEEDLE) ×4 IMPLANT
NEEDLE PERC 18GX7CM (NEEDLE) IMPLANT
NS IRRIG 1000ML POUR BTL (IV SOLUTION) ×5 IMPLANT
PACK CV ACCESS (CUSTOM PROCEDURE TRAY) ×5 IMPLANT
PACK ENDO MINOR (CUSTOM PROCEDURE TRAY) IMPLANT
PACK SURGICAL SETUP 50X90 (CUSTOM PROCEDURE TRAY) ×5 IMPLANT
PAD ARMBOARD 7.5X6 YLW CONV (MISCELLANEOUS) ×10 IMPLANT
PATCH VASC XENOSURE 1CMX6CM (Vascular Products) ×4 IMPLANT
PATCH VASC XENOSURE 1X6 (Vascular Products) IMPLANT
SET MICROPUNCTURE 5F STIFF (MISCELLANEOUS) ×7 IMPLANT
SHEATH BRITE TIP 7FRX11 (SHEATH) ×1 IMPLANT
SHEATH PINNACLE 5F 10CM (SHEATH) ×1 IMPLANT
SLING ARM FOAM STRAP LRG (SOFTGOODS) IMPLANT
SLING ARM FOAM STRAP MED (SOFTGOODS) IMPLANT
SOAP 2 % CHG 4 OZ (WOUND CARE) ×5 IMPLANT
STOPCOCK MORSE 400PSI 3WAY (MISCELLANEOUS) ×5 IMPLANT
SUT ETHILON 3 0 PS 1 (SUTURE) ×5 IMPLANT
SUT ETHILON 4 0 PS 2 18 (SUTURE) ×1 IMPLANT
SUT MNCRL AB 4-0 PS2 18 (SUTURE) ×7 IMPLANT
SUT PROLENE 6 0 BV (SUTURE) ×7 IMPLANT
SUT SILK 0 TIES 10X30 (SUTURE) ×5 IMPLANT
SUT SILK 2 0 SH (SUTURE) IMPLANT
SUT VIC AB 3-0 SH 27 (SUTURE) ×12
SUT VIC AB 3-0 SH 27X BRD (SUTURE) ×4 IMPLANT
SYR 10ML LL (SYRINGE) ×15 IMPLANT
SYR 20ML LL LF (SYRINGE) ×10 IMPLANT
SYR 5ML LL (SYRINGE) ×5 IMPLANT
SYR CONTROL 10ML LL (SYRINGE) ×5 IMPLANT
TOWEL GREEN STERILE (TOWEL DISPOSABLE) ×5 IMPLANT
TOWEL GREEN STERILE FF (TOWEL DISPOSABLE) ×10 IMPLANT
TUBING CIL FLEX 10 FLL-RA (TUBING) ×5 IMPLANT
UNDERPAD 30X36 HEAVY ABSORB (UNDERPADS AND DIAPERS) ×5 IMPLANT
WATER STERILE IRR 1000ML POUR (IV SOLUTION) ×5 IMPLANT
WIRE BENTSON .035X145CM (WIRE) ×1 IMPLANT

## 2021-11-27 NOTE — Transfer of Care (Signed)
Immediate Anesthesia Transfer of Care Note  Patient: Brandi Love  Procedure(s) Performed: INSERTION OF Right Inteternal Jugular TUNNELED DIALYSIS CATHETER. (Neck) RIGHT UPPER EXTREMITY SECOND STAGE BASILIC VEIN TRANSPOSITION (Right) LIGATION OF COMPETING BRANCHES OF ARTERIOVENOUS FISTULA (Right) PATCH ANGIOPLASTY USING XENOSURE BIOLOGIC PATCH (Right: Arm Lower)  Patient Location: PACU  Anesthesia Type:General  Level of Consciousness: sedated  Airway & Oxygen Therapy: Patient Spontanous Breathing  Post-op Assessment: Report given to RN and Post -op Vital signs reviewed and stable  Post vital signs: Reviewed and stable  Last Vitals:  Vitals Value Taken Time  BP 127/86 11/27/21 1524  Temp 36.7 C 11/27/21 1524  Pulse 90 11/27/21 1526  Resp 15 11/27/21 1526  SpO2 93 % 11/27/21 1526  Vitals shown include unvalidated device data.  Last Pain:  Vitals:   11/27/21 0948  TempSrc:   PainSc: 9          Complications: No notable events documented.

## 2021-11-27 NOTE — Op Note (Signed)
NAME: GWENNA FUSTON    MRN: [id number] DOB: 07-30-1975    DATE OF OPERATION: 11/27/2021  PREOP DIAGNOSIS:    ESRD  POSTOP DIAGNOSIS:    Same  PROCEDURE:    Ultrasound-guided placement of left IJ 28 cm tunneled dialysis catheter Superficialization of right brachiocephalic AV fistula Ligation of competing branches Bovine pericardial patch angioplasty of stenosis in right brachiocephalic fistula  SURGEON: Judeth Cornfield. Scot Dock, MD  ASSIST: Leontine Locket, PA  ANESTHESIA: General  EBL: Minimal  INDICATIONS:    Corda Shutt Ousley is a 46 y.o. female who had a right brachiocephalic fistula placed.  The vein was too deep.  There was also an area of potential thrombus or stenosis noted in the fistula.  She was set up for revision.  We are also asked to place a catheter.  FINDINGS:   There was an area of severe stenosis where there was intimal hyperplasia and a valve site in the upper arm.  This was addressed with bovine pericardial patch angioplasty.  I placed the catheter on the left side since the fistula was on the right.  TECHNIQUE:   The patient was taken to the operating room and received a general anesthetic.  Both IJ's were patent by SonoSite.  The neck and upper chest were prepped and draped in usual sterile fashion.  Under ultrasound guidance, after the skin was anesthetized, I cannulated the left IJ with a micropuncture needle and a micropuncture sheath was introduced over a wire.  I then advanced the wire into the right atrium.  The exit site for the catheter was selected and the skin anesthetized between the 2 areas.  The catheter was brought through the tunnel.  The tract over the wire was dilated and the dilator and peel-away sheath were advanced over the wire.  The catheter was advanced through the peel-away sheath and positioned in the right atrium.  The catheter seemed to work best when I buried it as deep as possible.  Both ports withdrew easily at this point and were  flushed with heparinized saline.  Fluoroscopy showed no kinking of the catheter.  The catheter was secured at its exit site with a 3-0 nylon suture.  The IJ cannulation site was closed with a 4-0 Monocryl.  Dermabond was applied.  Sterile dressing was applied.  Attention was then turned to the right arm.  Right arm was prepped and draped in usual sterile fashion.  Using 2 longitudinal incisions over the upper arm fistula the vein was dissected free circumferentially.  Branches were divided tween 3-0 silk ties.  There was one larger branch which was addressed with a 2-0 silk tie.  The vein was fully mobilized and I excised a large amount of adipose tissue.  The vein was pulsatile up to a point and then the fistula became quite weak.  Here I could feel an area of stenosis within the fistula.  I elected to address this with venoplasty.  The patient was heparinized.  I rotated the fistula 180 degrees and opened the vein longitudinally.  There was an area of marked intimal hyperplasia in the bowel side.  I excised the intimal hyperplasia and spatula this area with a bovine pericardial patch using 2 continuous 6-0 Prolene sutures.  At the completion was an excellent thrill in the fistula and a palpable radial pulse.  I then closed the deep layers lifting the fistula higher with a running 3-0 Vicryl at both incisions.  The skin was then closed with  4-0 Monocryl.  Dermabond was applied.  Given the complexity of the case,  the assistant was necessary in order to expedient the procedure and safely perform the technical aspects of the operation.  The assistant provided traction and countertraction to assist with exposure of the artery and vein.  They also assisted with suture ligation of multiple venous branches.  They played a critical role in the anastomosis. These skills, especially following the Prolene suture for the anastomosis, could not have been adequately performed by a scrub tech assistant.   The patient  tolerated the procedure well and was transferred to recovery in stable condition.  All needle and sponge counts were correct.  Deitra Mayo, MD, FACS [facility]  DATE OF DICTATION:   11/27/2021

## 2021-11-27 NOTE — Discharge Instructions (Signed)
[facility]  Discharge Instructions  AV Fistula or Graft Surgery for Dialysis Access  Please refer to the following instructions for your post-procedure care. Your surgeon or physician assistant will discuss any changes with you.  Activity  You may drive the day following your surgery, if you are comfortable and no longer taking prescription pain medication. Resume full activity as the soreness in your incision resolves.  Bathing/Showering  You may shower after you go home. Keep your incision dry for 48 hours. Do not soak in a bathtub, hot tub, or swim until the incision heals completely. You may not shower if you have a hemodialysis catheter.  Incision Care  Clean your incision with mild soap and water after 48 hours. Pat the area dry with a clean towel. You do not need a bandage unless otherwise instructed. Do not apply any ointments or creams to your incision. You may have skin glue on your incision. Do not peel it off. It will come off on its own in about one week. Your arm may swell a bit after surgery. To reduce swelling use pillows to elevate your arm so it is above your heart. Your doctor will tell you if you need to lightly wrap your arm with an ACE bandage.  Diet  Resume your normal diet. There are not special food restrictions following this procedure. In order to heal from your surgery, it is CRITICAL to get adequate nutrition. Your body requires vitamins, minerals, and protein. Vegetables are the best source of vitamins and minerals. Vegetables also provide the perfect balance of protein. Processed food has little nutritional value, so try to avoid this.  Medications  Resume taking all of your medications. If your incision is causing pain, you may take over-the counter pain relievers such as acetaminophen (Tylenol). If you were prescribed a stronger pain medication, please be aware these medications can cause nausea and constipation. Prevent  nausea by taking the medication with a snack or meal. Avoid constipation by drinking plenty of fluids and eating foods with high amount of fiber, such as fruits, vegetables, and grains.  Do not take Tylenol if you are taking prescription pain medications.  Follow up Your surgeon may want to see you in the office following your access surgery. If so, this will be arranged at the time of your surgery.  Please call us immediately for any of the following conditions:  Increased pain, redness, drainage (pus) from your incision site Fever of 101 degrees or higher Severe or worsening pain at your incision site Hand pain or numbness.  Reduce your risk of vascular disease:  Stop smoking. If you would like help, call [facility] at 1-800-QUIT-NOW 2501514187) or [place] at [place] your cholesterol Maintain a desired weight Control your diabetes Keep your blood pressure down  Dialysis  It will take several weeks to several months for your new dialysis access to be ready for use. Your surgeon will determine when it is okay to use it. Your nephrologist will continue to direct your dialysis. You can continue to use your Permcath until your new access is ready for use.   11/27/2021 [place] [id number] [birth year]  Surgeon(s): Angelia Mould, MD  Procedure(s): INSERTION OF Right Inteternal Jugular TUNNELED DIALYSIS CATHETER. RIGHT UPPER EXTREMITY SECOND STAGE BASILIC VEIN TRANSPOSITION LIGATION OF COMPETING BRANCHES OF ARTERIOVENOUS FISTULA PATCH ANGIOPLASTY USING XENOSURE BIOLOGIC PATCH  x Do not stick fistula for 10 weeks    If you have any  questions, please call the office at [phone].

## 2021-11-27 NOTE — Anesthesia Preprocedure Evaluation (Signed)
Anesthesia Evaluation  Patient identified by MRN, date of birth, ID band Patient awake    Reviewed: Allergy & Precautions, H&P , NPO status , Patient's Chart, lab work & pertinent test results  History of Anesthesia Complications (+) PONV and history of anesthetic complications  Airway Mallampati: II   Neck ROM: full    Dental   Pulmonary    breath sounds clear to auscultation       Cardiovascular hypertension,  Rhythm:regular Rate:Normal     Neuro/Psych  Headaches, Seizures -,  PSYCHIATRIC DISORDERS Anxiety Depression    GI/Hepatic GERD  ,  Endo/Other  diabetes, Insulin DependentHypothyroidism   Renal/GU ESRF and DialysisRenal disease     Musculoskeletal  (+) Arthritis ,   Abdominal   Peds  Hematology   Anesthesia Other Findings   Reproductive/Obstetrics                             Anesthesia Physical Anesthesia Plan  ASA: 3  Anesthesia Plan: General   Post-op Pain Management:    Induction: Intravenous  PONV Risk Score and Plan: 4 or greater and Ondansetron, Dexamethasone, Midazolam and Treatment may vary due to age or medical condition  Airway Management Planned: LMA  Additional Equipment:   Intra-op Plan:   Post-operative Plan: Extubation in OR  Informed Consent: I have reviewed the patients History and Physical, chart, labs and discussed the procedure including the risks, benefits and alternatives for the proposed anesthesia with the patient or authorized representative who has indicated his/her understanding and acceptance.     Dental advisory given  Plan Discussed with: CRNA, Anesthesiologist and Surgeon  Anesthesia Plan Comments:         Anesthesia Quick Evaluation

## 2021-11-27 NOTE — Progress Notes (Signed)
VASCULAR SURGERY:  This patient had a brachiocephalic AV fistula performed on 05/04/2021.  In reviewing the operative report the basilic and cephalic veins were attached via the median cubital branch and the basilic vein was preserved.  But this was a brachiocephalic fistula.  The patient was seen in our office on 11/16/2021 by Karoline Caldwell, PA.  It was felt that the fistula was too deep.  In addition some competing branches were identified.  In addition Dr. Augustin Coupe had told her that her veins were small and that he had angioplasty one of her veins which would not likely need stenting in the future.  I reviewed the duplex scan.  The diameters of the fistula in the upper arm ranged from 6.1mm to 19 mm.  The vein was up to 10 mm deep in the proximal upper arm.  Gae Gallop, MD 11:00 AM

## 2021-11-27 NOTE — Anesthesia Procedure Notes (Signed)
Procedure Name: LMA Insertion Date/Time: 11/27/2021 12:40 PM  Performed by: Minerva Ends, CRNAPre-anesthesia Checklist: Patient identified, Emergency Drugs available, Suction available and Patient being monitored Patient Re-evaluated:Patient Re-evaluated prior to induction Oxygen Delivery Method: Circle system utilized Preoxygenation: Pre-oxygenation with 100% oxygen Induction Type: IV induction Ventilation: Mask ventilation without difficulty LMA: LMA inserted LMA Size: 4.0 Tube type: Oral Number of attempts: 1 Placement Confirmation: positive ETCO2 and breath sounds checked- equal and bilateral Tube secured with: Tape Dental Injury: Teeth and Oropharynx as per pre-operative assessment

## 2021-11-28 ENCOUNTER — Encounter (HOSPITAL_COMMUNITY): Payer: Self-pay | Admitting: Vascular Surgery

## 2021-12-12 ENCOUNTER — Other Ambulatory Visit: Payer: Self-pay

## 2021-12-12 ENCOUNTER — Telehealth: Payer: Self-pay | Admitting: Cardiology

## 2021-12-12 MED ORDER — CARVEDILOL 6.25 MG PO TABS: 6.2500 mg | ORAL_TABLET | Freq: Two times a day (BID) | ORAL | 3 refills | Status: AC

## 2021-12-12 NOTE — Telephone Encounter (Signed)
Medication refilled

## 2021-12-12 NOTE — Telephone Encounter (Signed)
Pt request refill for below meds urgently, completely out.  carvedilol (COREG) 6.25 MG tablet  [id number] - [place],  - [address] AT [place]  [phone]

## 2021-12-12 NOTE — Anesthesia Postprocedure Evaluation (Signed)
Anesthesia Post Note  Patient: Brandi Love  Procedure(s) Performed: INSERTION OF Right Inteternal Jugular TUNNELED DIALYSIS CATHETER. (Neck) RIGHT UPPER EXTREMITY SECOND STAGE BASILIC VEIN TRANSPOSITION (Right) LIGATION OF COMPETING BRANCHES OF ARTERIOVENOUS FISTULA (Right) PATCH ANGIOPLASTY USING XENOSURE BIOLOGIC PATCH (Right: Arm Lower)     Patient location during evaluation: PACU Anesthesia Type: General Level of consciousness: awake and alert Pain management: pain level controlled Vital Signs Assessment: post-procedure vital signs reviewed and stable Respiratory status: spontaneous breathing, nonlabored ventilation, respiratory function stable and patient connected to nasal cannula oxygen Cardiovascular status: blood pressure returned to baseline and stable Postop Assessment: no apparent nausea or vomiting Anesthetic complications: no   No notable events documented.  Last Vitals:  Vitals:   11/27/21 1545 11/27/21 1600  BP: (!) 134/92 (!) 147/97  Pulse: 94 99  Resp: (!) 23 (!) 21  Temp:  36.7 C  SpO2: 92% 92%    Last Pain:  Vitals:   11/27/21 1600  TempSrc:   PainSc: 2    Pain Goal:                   Taber Sweetser S

## 2021-12-31 ENCOUNTER — Other Ambulatory Visit: Payer: Self-pay | Admitting: *Deleted

## 2021-12-31 DIAGNOSIS — N186 End stage renal disease: Secondary | ICD-10-CM

## 2022-01-10 ENCOUNTER — Ambulatory Visit (INDEPENDENT_AMBULATORY_CARE_PROVIDER_SITE_OTHER): Payer: Medicare Other | Admitting: Vascular Surgery

## 2022-01-10 ENCOUNTER — Ambulatory Visit (HOSPITAL_COMMUNITY)
Admission: RE | Admit: 2022-01-10 | Discharge: 2022-01-10 | Disposition: A | Discharge status: Home / Self Care | Payer: Medicare Other | Source: Ambulatory Visit | Attending: Vascular Surgery | Admitting: Vascular Surgery

## 2022-01-10 ENCOUNTER — Encounter: Payer: Self-pay | Admitting: Vascular Surgery

## 2022-01-10 VITALS — BP 100/71 | HR 102 | Temp 97.9°F | Resp 20 | Ht 68.0 in | Wt 163.0 lb

## 2022-01-10 DIAGNOSIS — N186 End stage renal disease: Secondary | ICD-10-CM

## 2022-01-10 DIAGNOSIS — Z992 Dependence on renal dialysis: Secondary | ICD-10-CM | POA: Insufficient documentation

## 2022-01-10 NOTE — Progress Notes (Signed)
Patient name: Brandi Love MRN: [id number] DOB: 04/01/1976 Sex: female  REASON FOR VISIT:   Follow-up after revision of right brachiocephalic fistula  HPI:   Brandi Love is a pleasant 46 y.o. female who had a right brachial cephalic fistula that was too deep.  There was also an area of potential thrombus or stenosis noted in the fistula on duplex.  She was set up for revision and placement of a catheter.  On 11/27/2021 she underwent placement of a left IJ tunneled dialysis catheter, superficialization of her right brachiocephalic fistula, ligation of competing branches, and bovine pericardial patch angioplasty of a stenosis in the fistula.  She comes in for an outpatient visit.  Today she has no specific complaints.  Her catheter has been working well.  I had told her after surgery they could use the fistula in 10 weeks which would be 02/05/2022.  Current Outpatient Medications  Medication Sig Dispense Refill   acetaminophen (TYLENOL) 500 MG tablet Take 1,000-2,500 mg by mouth See admin instructions. Take 2 tablets (1000 mg) by mouth in the morning, and take 5 tablets (2500 mg) by mouth at bedtime     albuterol (VENTOLIN HFA) 108 (90 Base) MCG/ACT inhaler Inhale 1 puff into the lungs every 6 (six) hours as needed for wheezing or shortness of breath. 8 g 0   allopurinol (ZYLOPRIM) 300 MG tablet Take 300 mg by mouth at bedtime.     carvedilol (COREG) 6.25 MG tablet Take 1 tablet (6.25 mg total) by mouth in the morning and at bedtime. 60 tablet 3   CINNAMON PO Take 2,000 mg by mouth in the morning and at bedtime.     cyclobenzaprine (FLEXERIL) 5 MG tablet Take 5 mg by mouth in the morning, at noon, and at bedtime.     docusate sodium (COLACE) 100 MG capsule Take 200 mg by mouth 2 (two) times daily.     ELDERBERRY PO Take 150 mg by mouth in the morning.     estradiol (CLIMARA - DOSED IN MG/24 HR) 0.05 mg/24hr patch Place 0.05 mg onto the skin every Sunday.     ferrous sulfate 325 (65 FE) MG  tablet Take 325 mg by mouth in the morning.     fluticasone (FLONASE) 50 MCG/ACT nasal spray Place 1 spray into both nostrils daily as needed for allergies or rhinitis.     furosemide (LASIX) 40 MG tablet Take 1 tablet (40 mg total) by mouth daily. (Patient taking differently: Take 40 mg by mouth See admin instructions. Take 2 tablets (80 mg) by mouth in the morning & take 1 tablet (40 mg) by mouth at night.) 15 tablet 0   gemfibrozil (LOPID) 600 MG tablet Take 300-600 mg by mouth See admin instructions. Take 1 tablet (600 mg) by mouth in the morning and 0.5 tablet (300 mg) by mouth in the evening     HYDROcodone-acetaminophen (NORCO/VICODIN) 5-325 MG tablet Take 1 tablet by mouth every 6 (six) hours as needed for moderate pain or severe pain. (Patient taking differently: Take 1 tablet by mouth in the morning, at noon, in the evening, and at bedtime.) 20 tablet 0   insulin aspart (NOVOLOG) 100 UNIT/ML injection Inject 1-6 Units into the skin 4 (four) times daily. Sliding Scale     insulin detemir (LEVEMIR FLEXPEN) 100 UNIT/ML FlexPen Inject 20 Units into the skin every evening.     levothyroxine (SYNTHROID) 175 MCG tablet Take 175 mcg by mouth daily before breakfast.  lurasidone (LATUDA) 20 MG TABS tablet Take 20 mg by mouth at bedtime.     meclizine (ANTIVERT) 25 MG tablet Take 25 mg by mouth 3 (three) times daily as needed for dizziness.     Melatonin 5 MG TABS Take 10 mg by mouth at bedtime.     mupirocin ointment (BACTROBAN) 2 % Apply 1 Application topically 2 (two) times daily. To affected area till better 22 g 0   ondansetron (ZOFRAN-ODT) 8 MG disintegrating tablet Take 8 mg by mouth every 8 (eight) hours as needed for nausea or vomiting.     pantoprazole (PROTONIX) 40 MG tablet Take 40 mg by mouth in the morning.     promethazine (PHENERGAN) 25 MG tablet Take 25 mg by mouth every 8 (eight) hours as needed for nausea or vomiting.     sertraline (ZOLOFT) 50 MG tablet Take 50 mg by mouth every  evening.     triamcinolone cream (KENALOG) 0.1 % Apply 1 application topically 2 (two) times daily as needed (skin irritation).     vitamin C (ASCORBIC ACID) 500 MG tablet Take 500 mg by mouth in the morning.     Vitamin D, Ergocalciferol, (DRISDOL) 1.25 MG (50000 UNIT) CAPS capsule Take 50,000 Units by mouth every Monday.     No current facility-administered medications for this visit.    REVIEW OF SYSTEMS:  [X]  denotes positive finding, [ ]  denotes negative finding Vascular    Leg swelling    Cardiac    Chest pain or chest pressure:    Shortness of breath upon exertion:    Short of breath when lying flat:    Irregular heart rhythm:    Constitutional    Fever or chills:     PHYSICAL EXAM:   Vitals:   01/10/22 1433  BP: 100/71  Pulse: (!) 102  Resp: 20  Temp: 97.9 F (36.6 C)  SpO2: 96%  Weight: 163 lb (73.9 kg)  Height: 5\' 8"  (1.727 m)    GENERAL: The patient is a well-nourished female, in no acute distress. The vital signs are documented above. CARDIOVASCULAR: There is a regular rate and rhythm. PULMONARY: There is good air exchange bilaterally without wheezing or rales. VASCULAR: Her right upper arm fistula has an excellent thrill.  Her incisions are healing nicely.  She has a palpable right radial pulse.  She has no significant right upper extremity swelling.  DATA:   DUPLEX AV FISTULA: I have independently interpreted her duplex of the AV fistula.  There are no areas of stenosis or thrombus identified.  The outflow vein is slightly tortuous.  MEDICAL ISSUES:   END-STAGE RENAL DISEASE: The patient is doing well status post superficialization and revision of her right upper arm fistula.  They can begin using her fistula on 02/05/2022 which would be 10 weeks postop.  Once we know that the fistula is working well arrangements can be made to have her catheter removed.  I will see her back as needed.  Deitra Mayo [facility]  [place] [phone]

## 2023-01-15 IMAGING — US US RENAL
1 series · 14 of 25 positions shown · non-contrast
Comparison: CT abdomen pelvis 11/06/2019.

CLINICAL DATA: Acute kidney injury on chronic renal disease.

EXAM:
RENAL / URINARY TRACT ULTRASOUND COMPLETE

[Series 1: us renal · 14 of 29 slices shown]
[im 1/29]
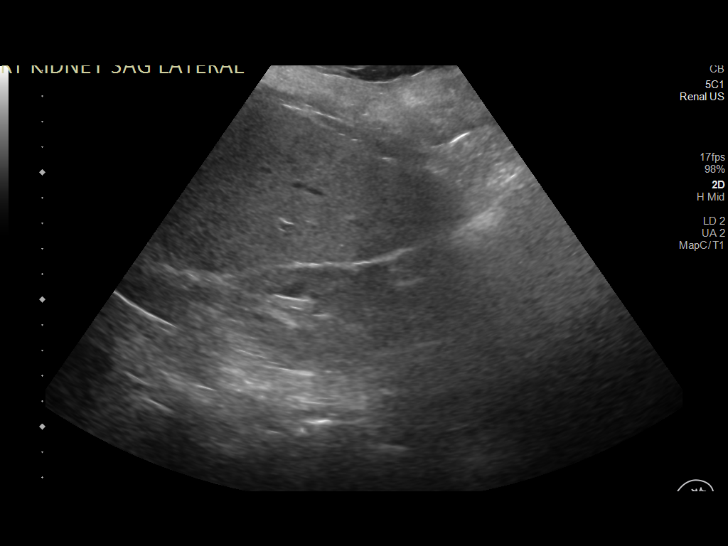
[im 3/29]
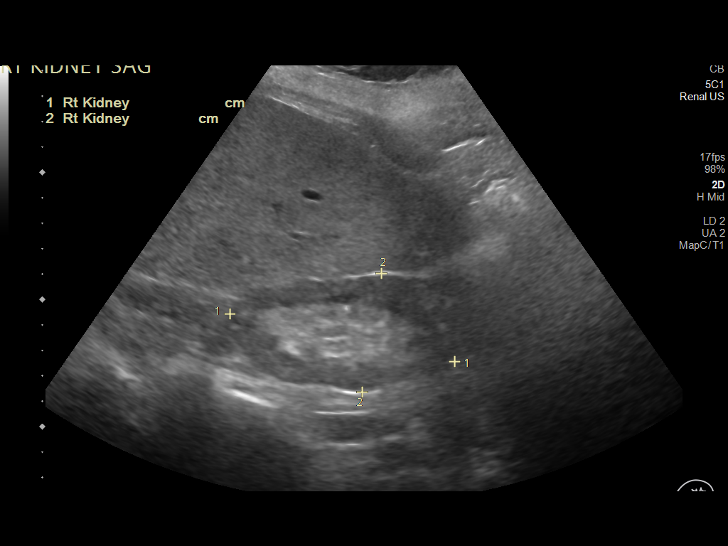
[im 5/29]
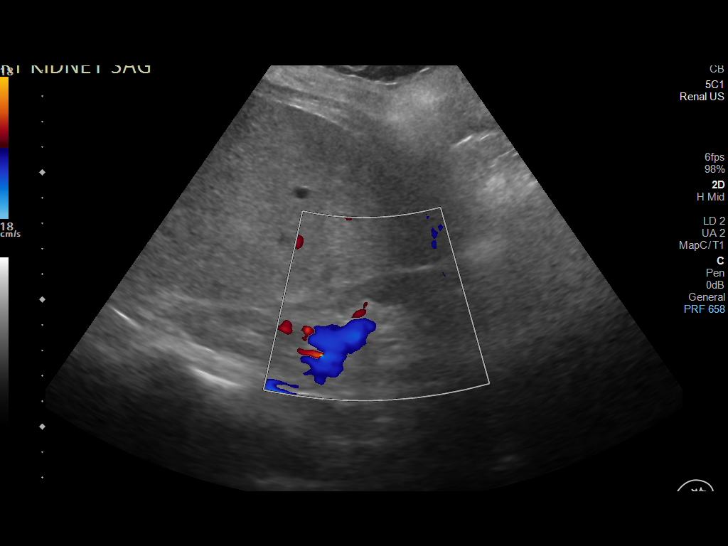
[im 8/29]
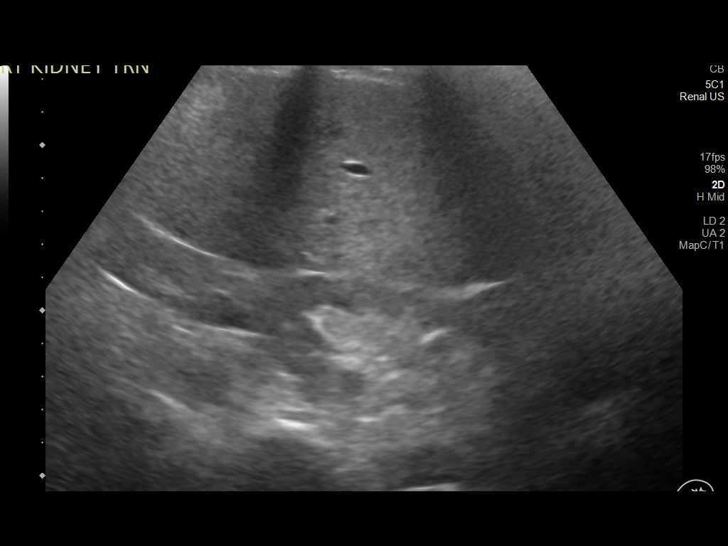
[im 10/29]
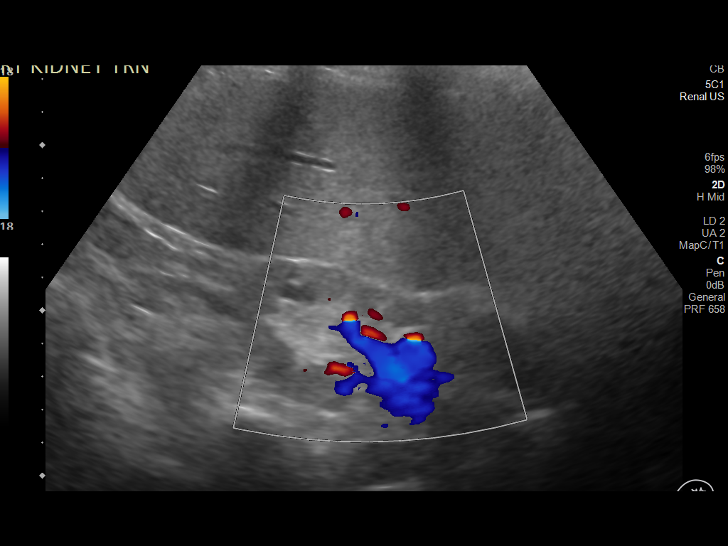
[im 11/29]
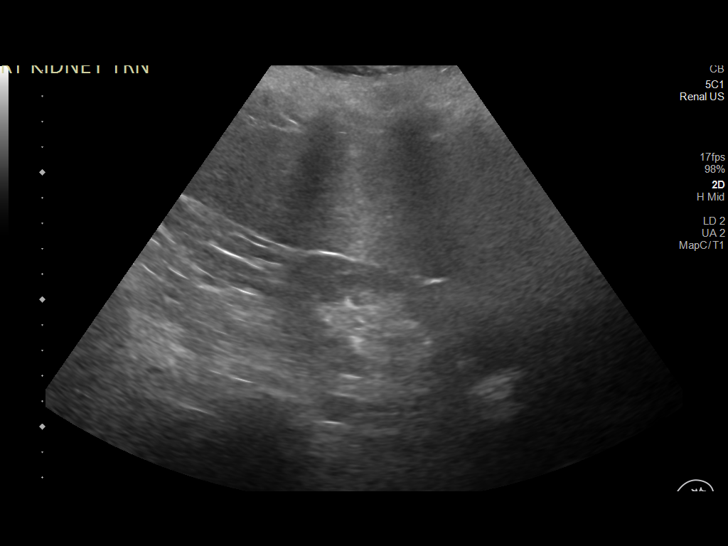
[im 13/29]
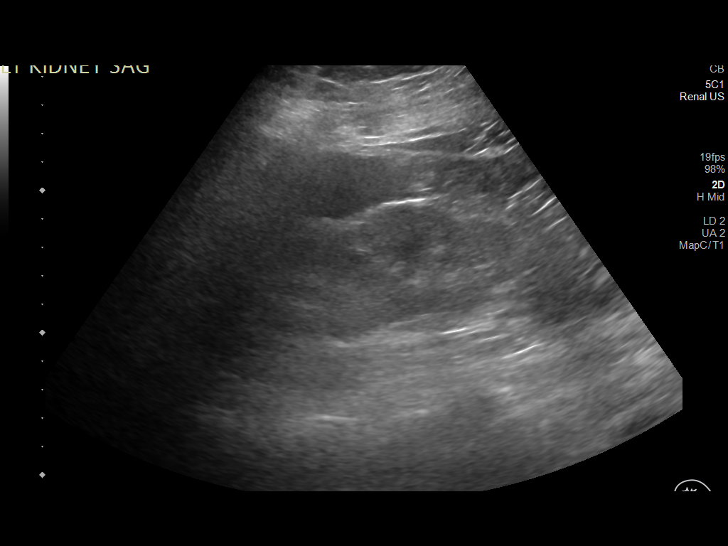
[im 16/29]
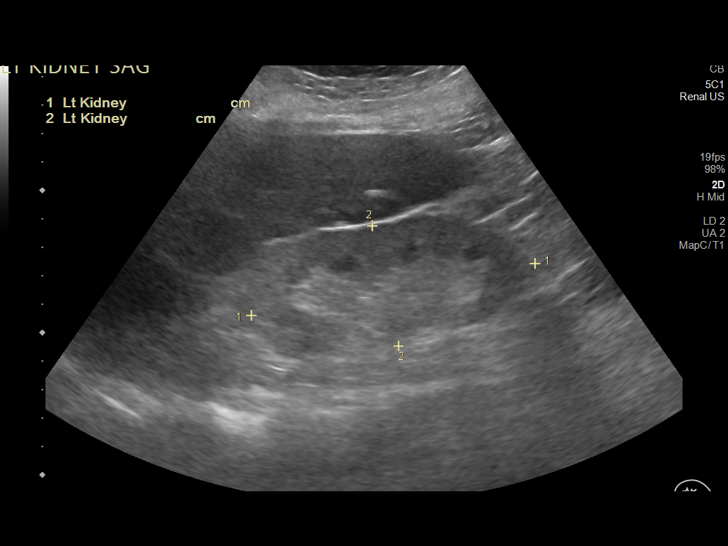
[im 18/29]
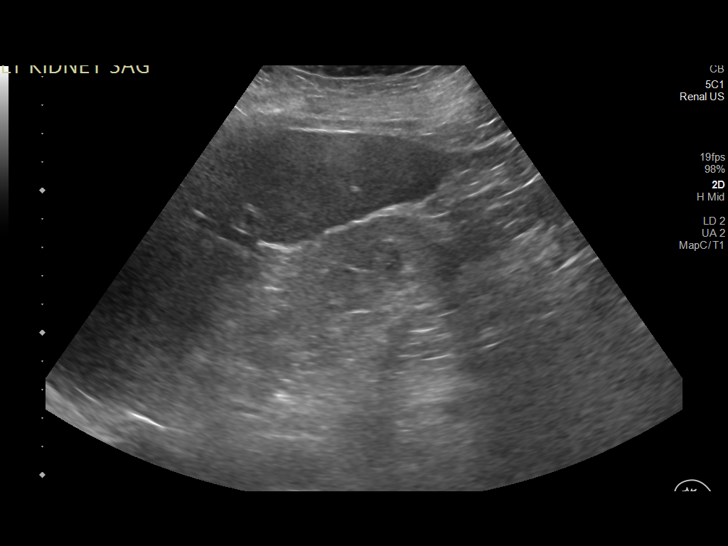
[im 19/29]
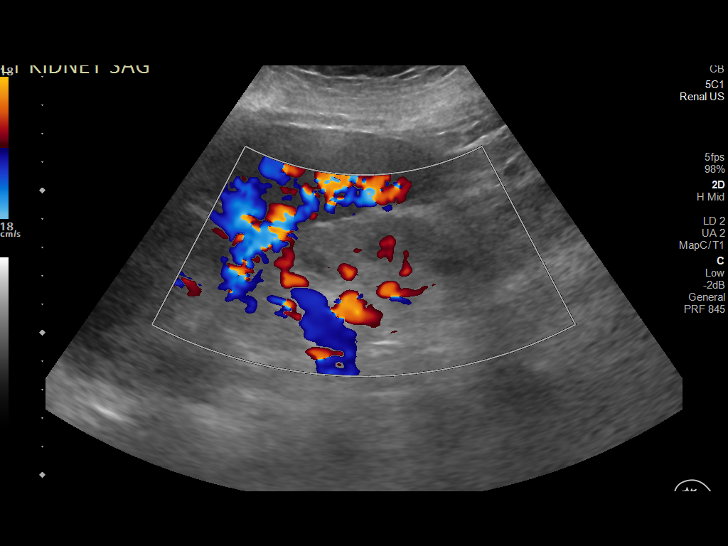
[im 22/29]
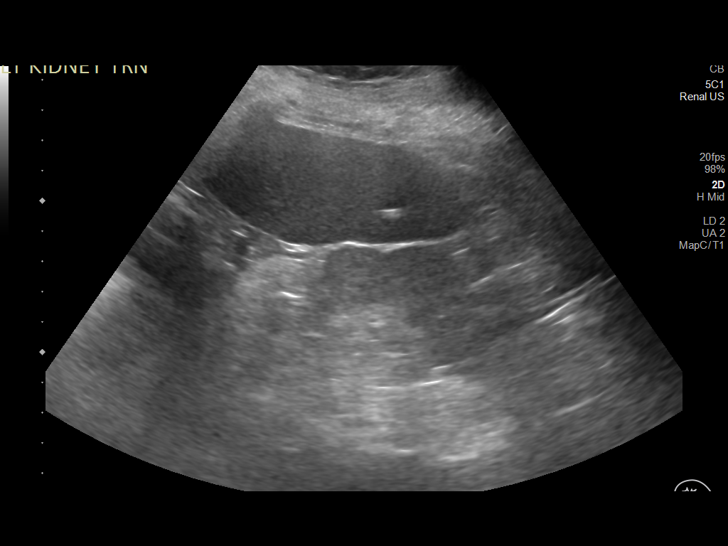
[im 24/29]
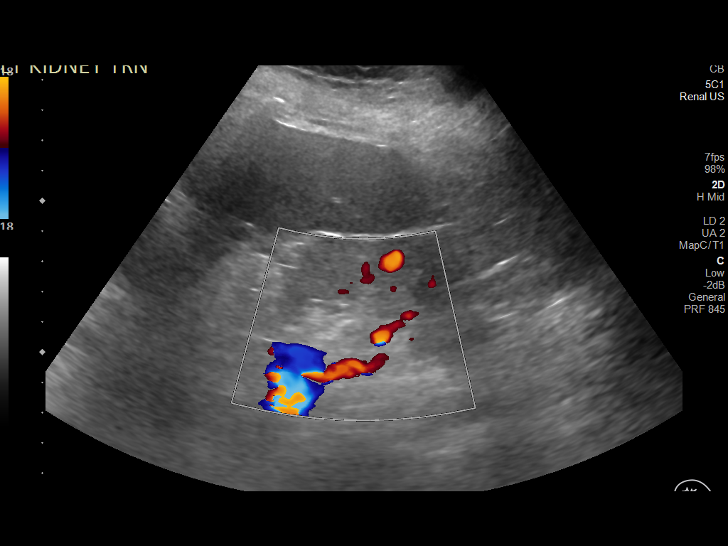
[im 26/29]
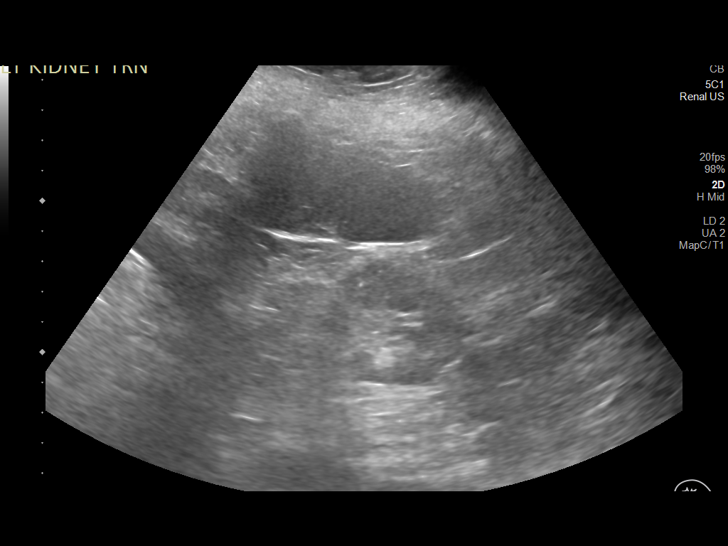
[im 29/29]
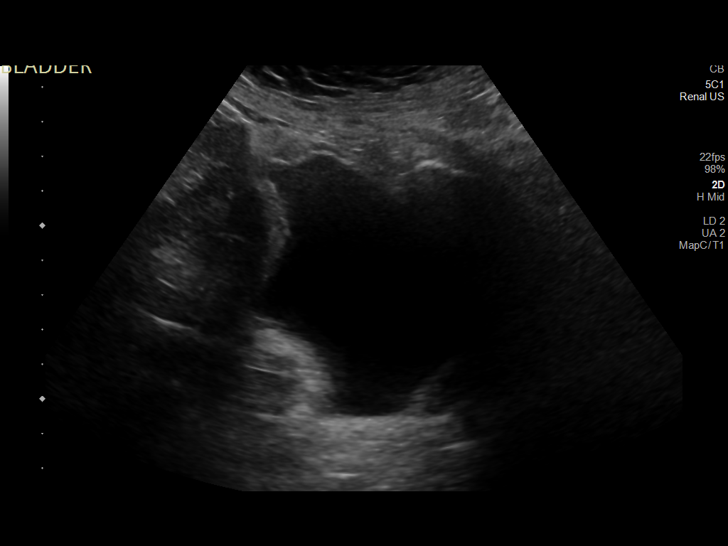

[14 of 25 positions shown; findings below may reference images not displayed]

FINDINGS: Right Kidney:

Renal measurements: 9 x 4.7 x 4.2 cm = volume: 93 mL. Echogenicity
is increased. No mass or hydronephrosis visualized.

Left Kidney:

Renal measurements: 10.1 x 4.3 x 5.6 cm = volume: 129 mL.
Echogenicity is increased. No mass or hydronephrosis visualized.

Urinary bladder:

Appears normal for degree of bladder distention.

Other:

None.
IMPRESSION: Increased echogenicity consistent with node renal parenchymal
disease.

## 2023-01-15 IMAGING — DX DG CHEST 2V
2 series · 2 of 2 positions shown · non-contrast
Comparison: March 01, 2020.

CLINICAL DATA: Chest pain, shortness of breath.

EXAM:
CHEST - 2 VIEW

[chest lat]
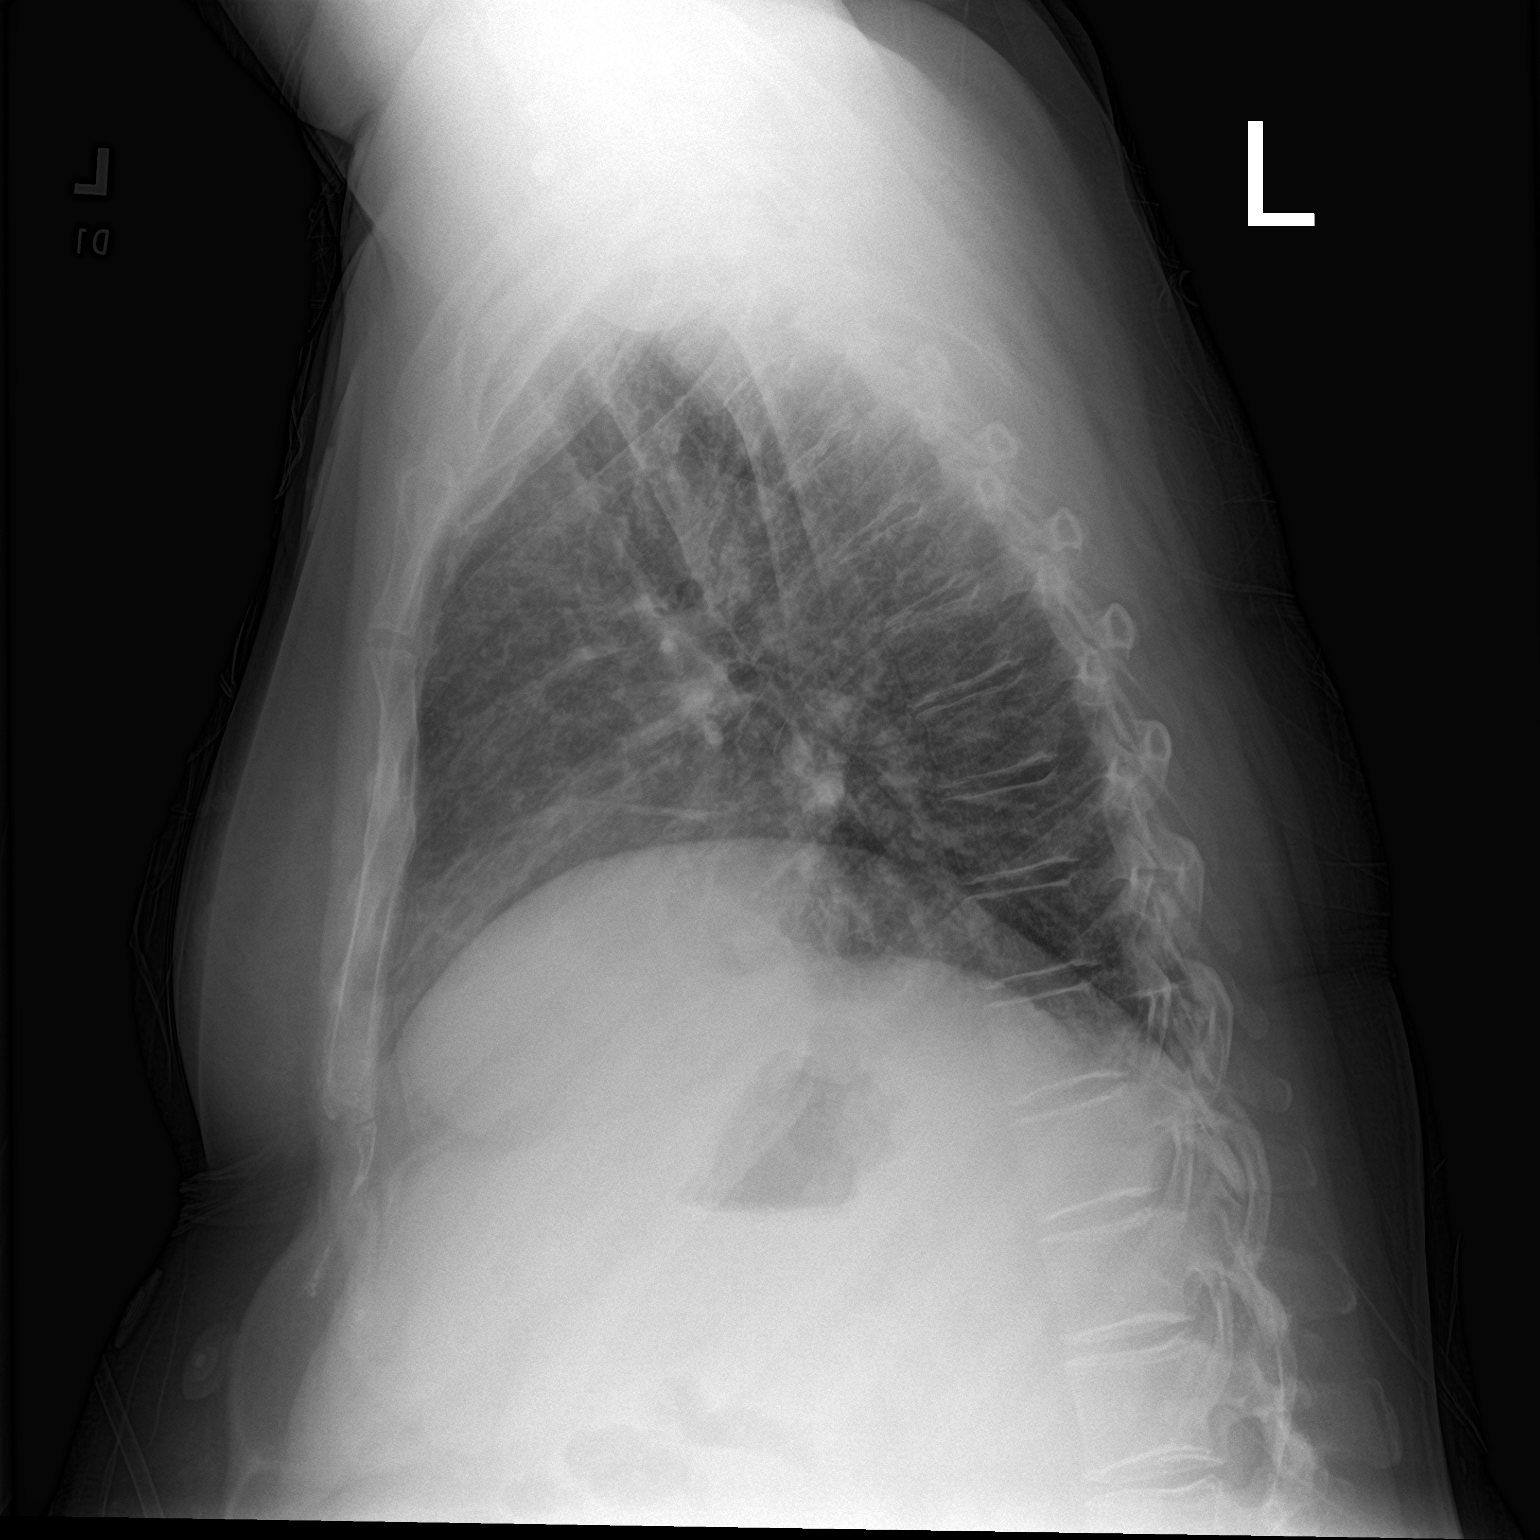

[chest ap]
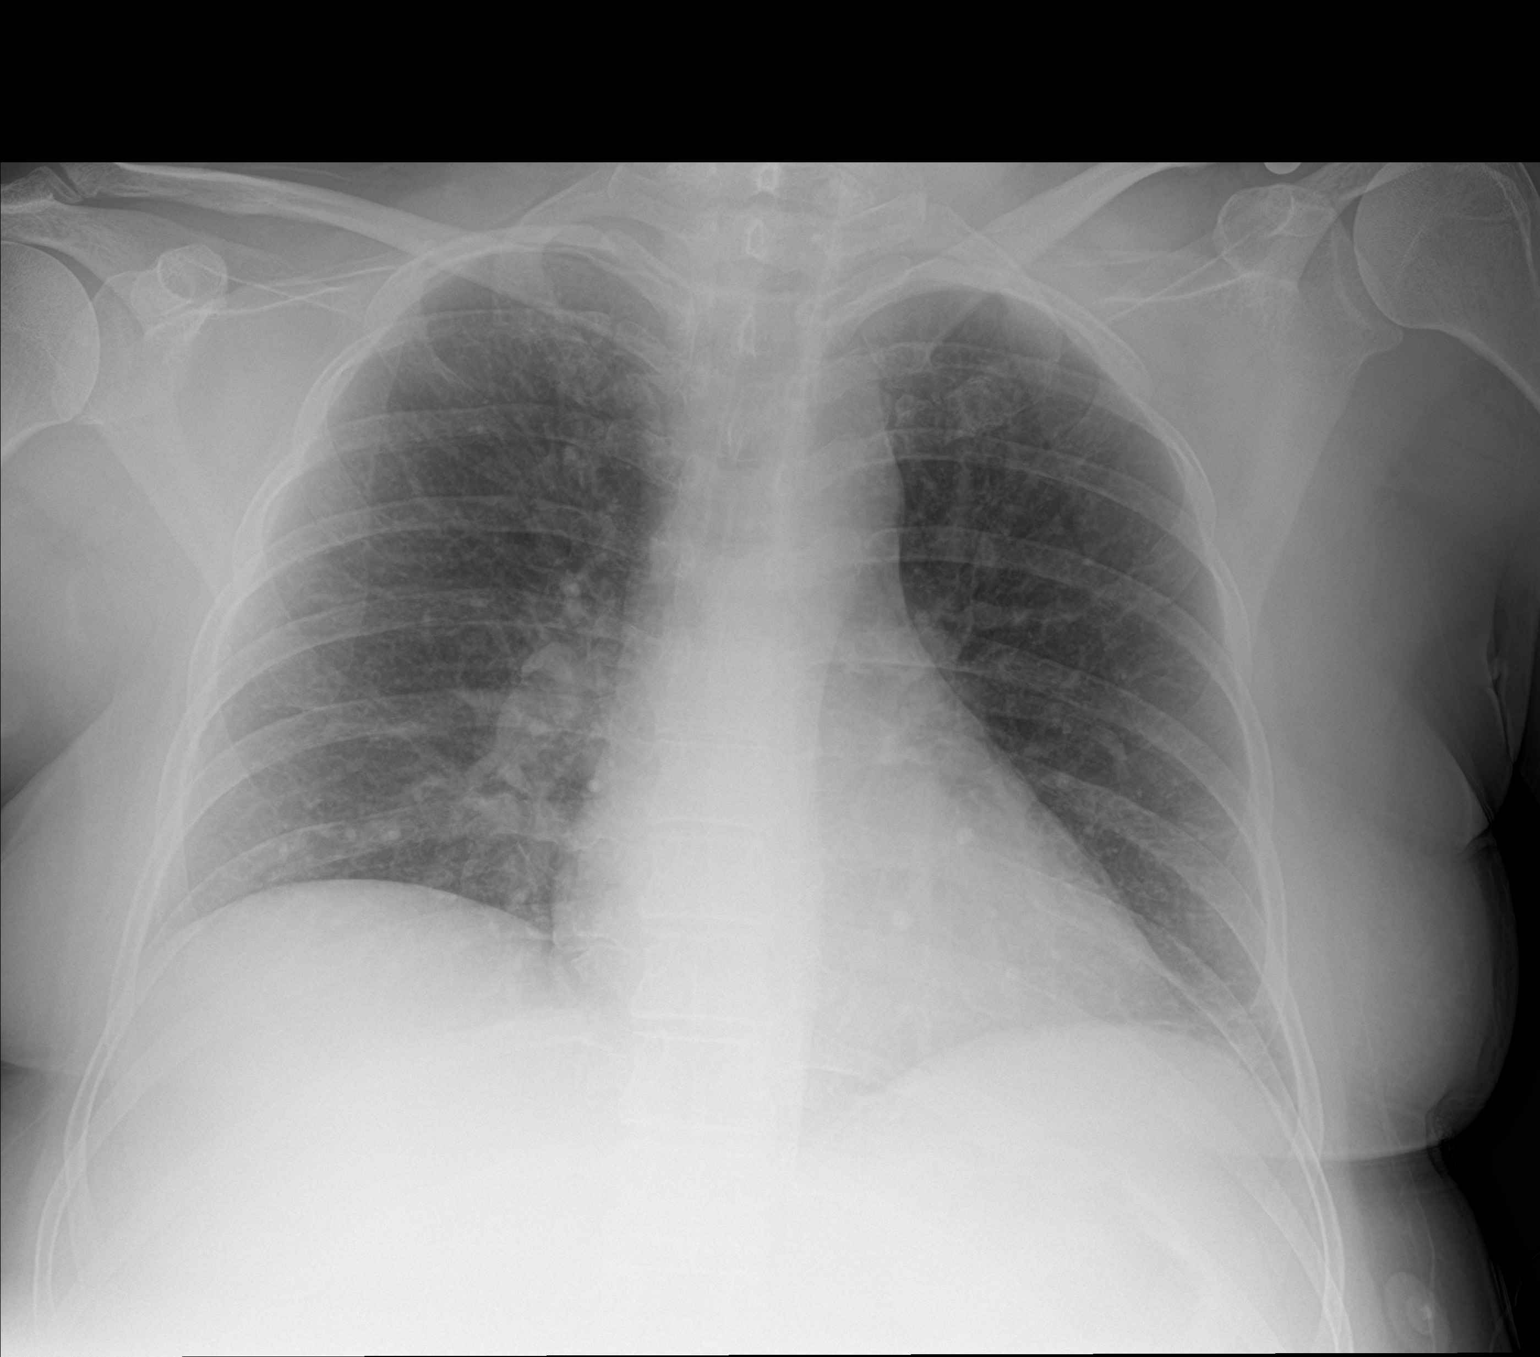

[2 of 2 positions shown; findings below may reference images not displayed]

FINDINGS: The heart size and mediastinal contours are within normal limits.
Both lungs are clear. No pneumothorax or pleural effusion is noted.
The visualized skeletal structures are unremarkable.
IMPRESSION: No active cardiopulmonary disease.

## 2023-01-17 IMAGING — CR DG ABDOMEN 1V
2 series · 2 of 2 positions shown · non-contrast
Comparison: None.

CLINICAL DATA: No bowel movements for 3 days

EXAM:
ABDOMEN - 1 VIEW

[abdomen kub (1 of 2)]
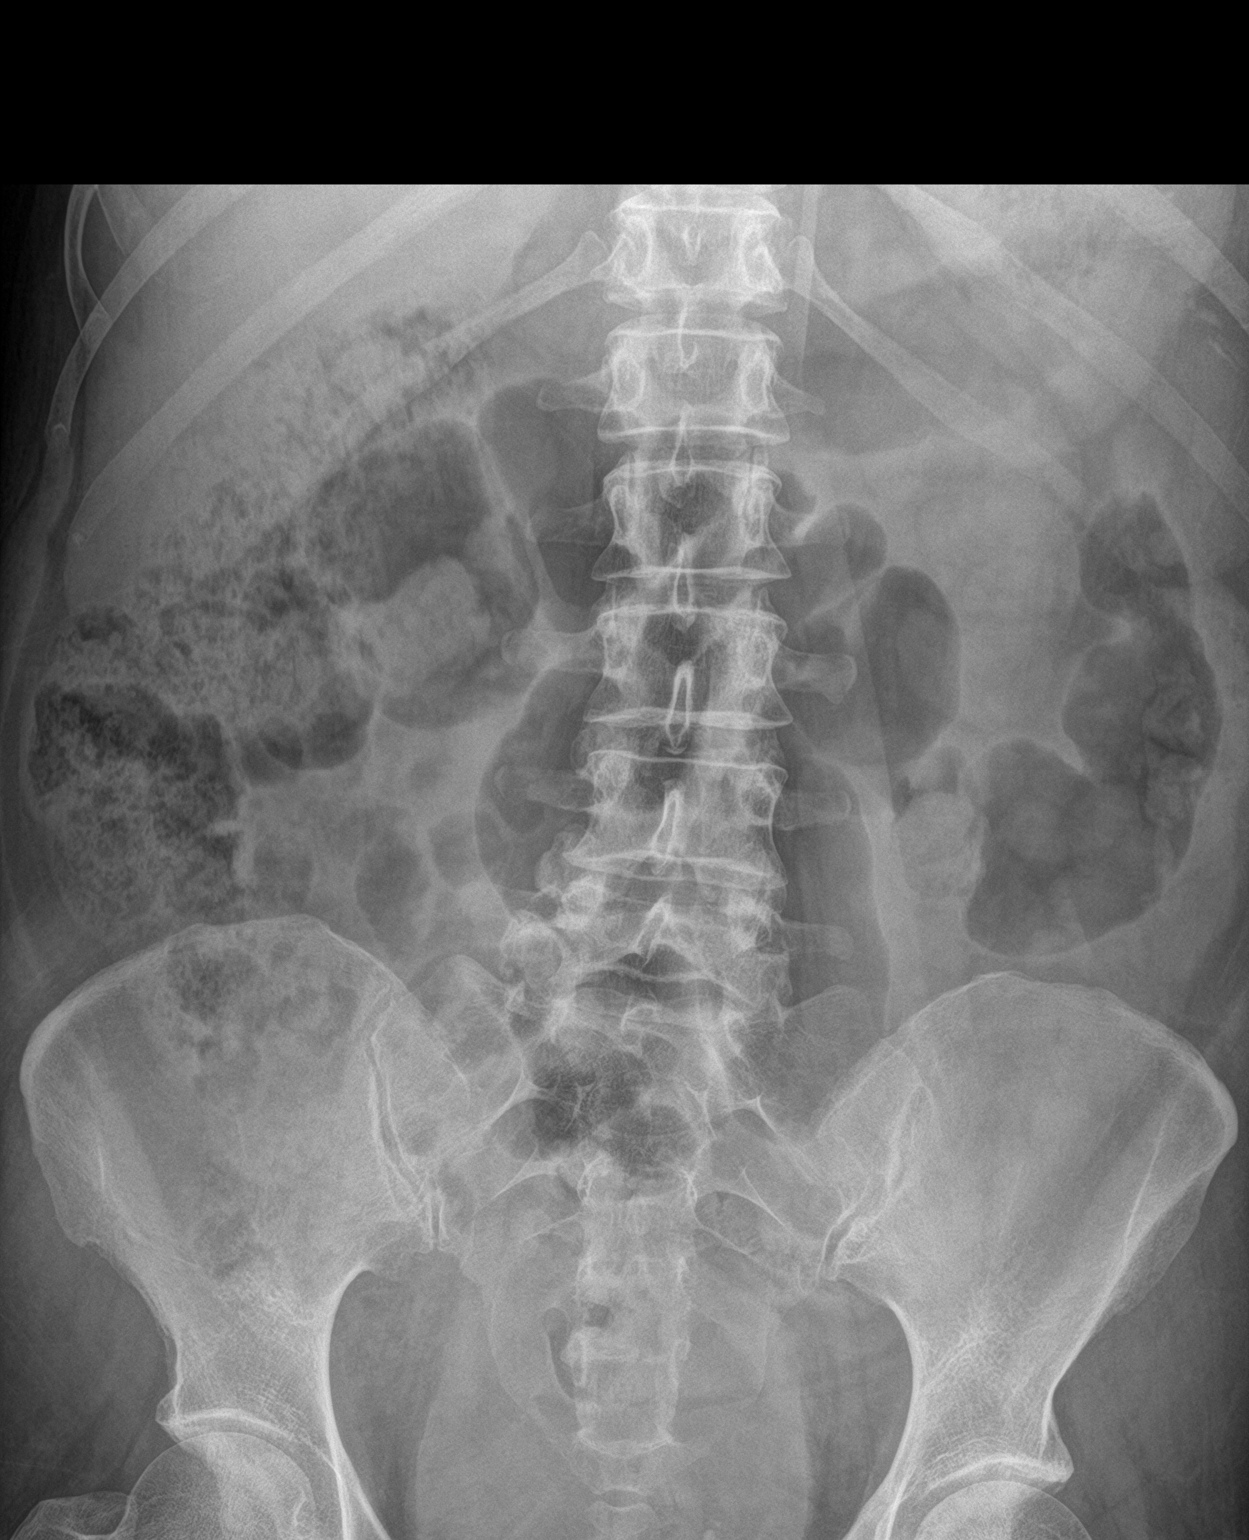

[abdomen kub (2 of 2)]
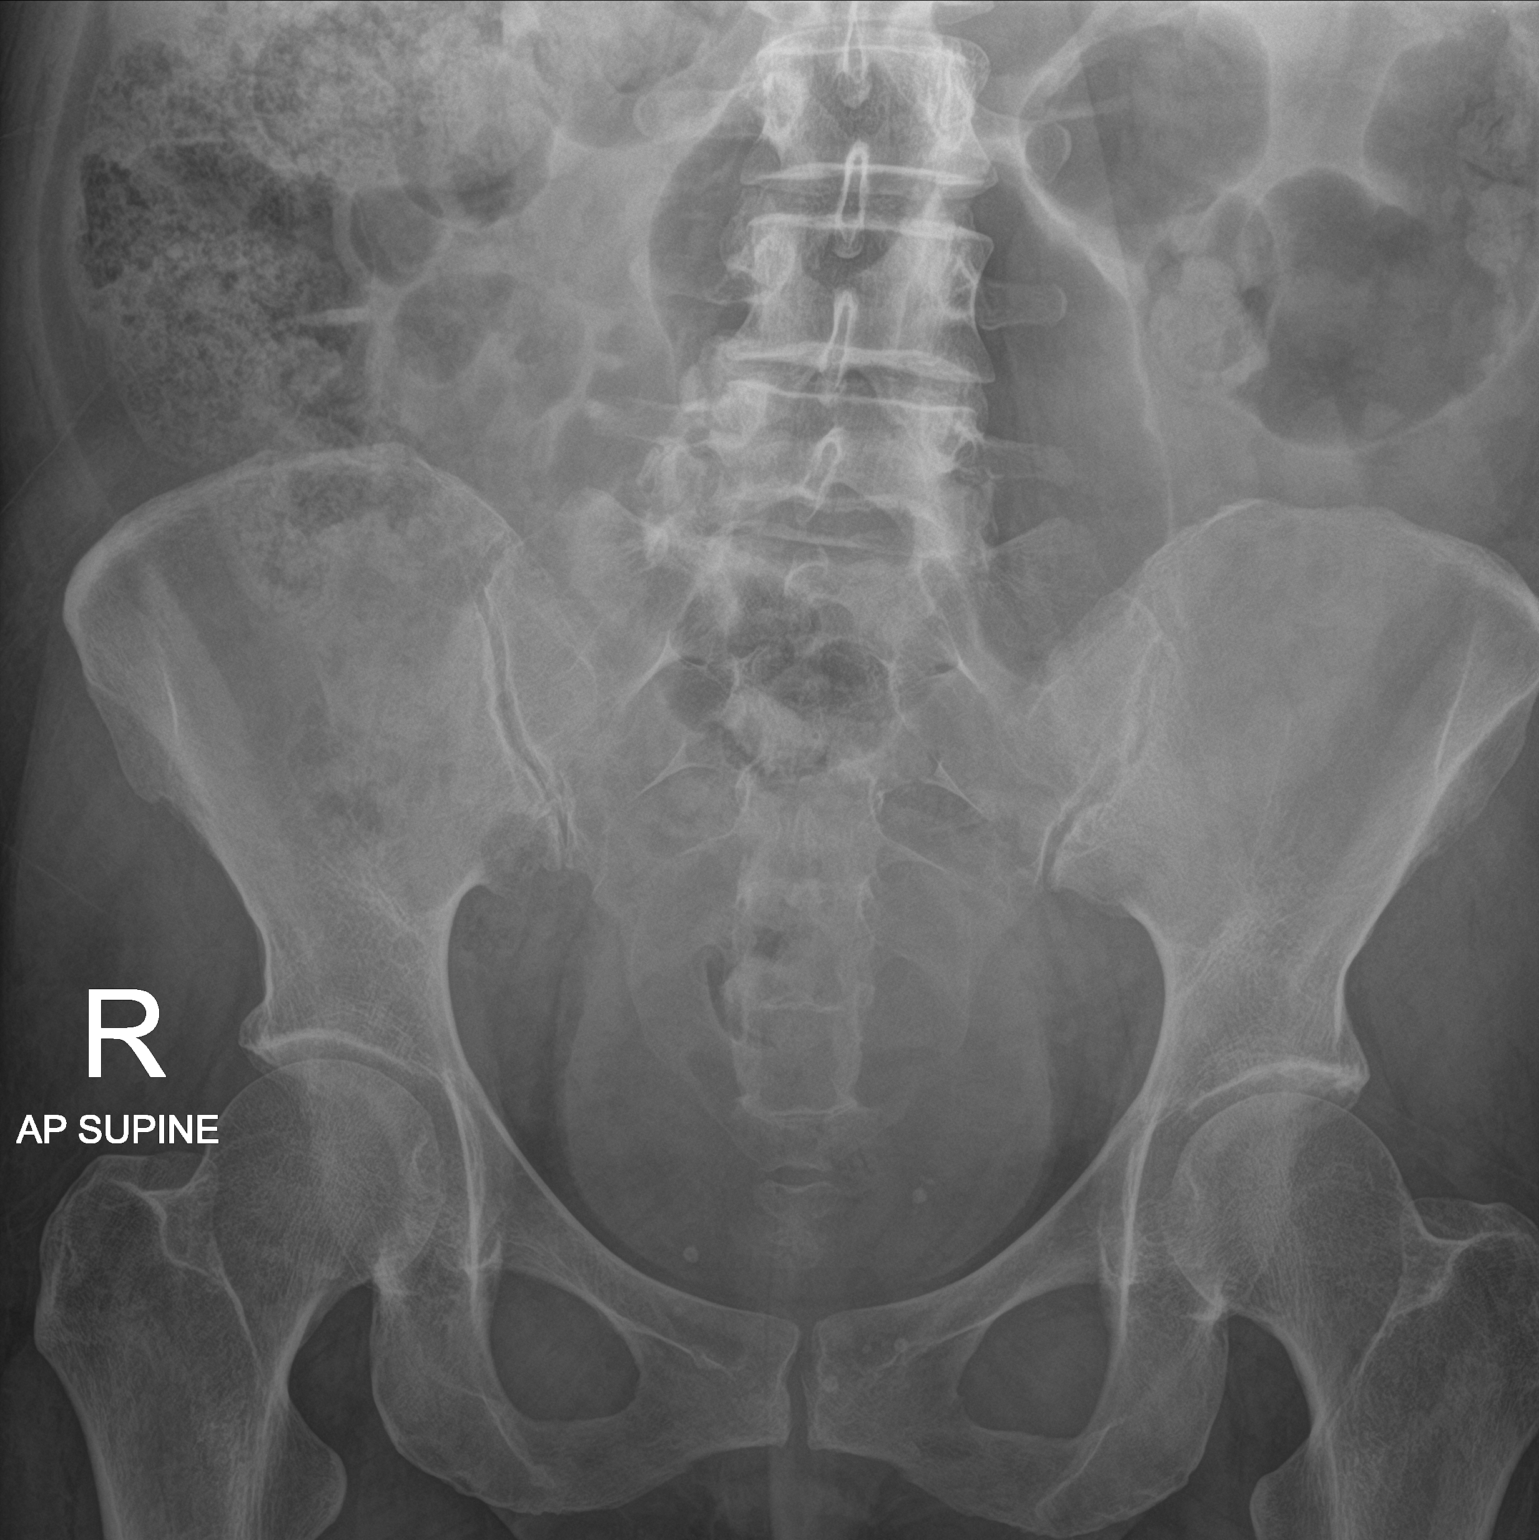

[2 of 2 positions shown; findings below may reference images not displayed]

FINDINGS: Scattered large and small bowel gas is noted. Some retained fecal
material is noted primarily in the right colon. No obstructive
changes are seen. No free air is noted. No bony abnormality is seen.
IMPRESSION: Retained fecal material primarily in the right colon.
